# Patient Record
Sex: Male | Born: 1965 | Race: Black or African American | Hispanic: No | Marital: Married | Smoking: Never smoker
Health system: Southern US, Community
[De-identification: ages and names within clinical notes are randomized; demographics above are authoritative.]

## PROBLEM LIST (undated history)

## (undated) DIAGNOSIS — Z8619 Personal history of other infectious and parasitic diseases: Secondary | ICD-10-CM

## (undated) DIAGNOSIS — C6920 Malignant neoplasm of unspecified retina: Secondary | ICD-10-CM

## (undated) DIAGNOSIS — R319 Hematuria, unspecified: Secondary | ICD-10-CM

## (undated) DIAGNOSIS — C7800 Secondary malignant neoplasm of unspecified lung: Secondary | ICD-10-CM

## (undated) DIAGNOSIS — Z978 Presence of other specified devices: Secondary | ICD-10-CM

## (undated) DIAGNOSIS — R339 Retention of urine, unspecified: Secondary | ICD-10-CM

## (undated) DIAGNOSIS — R0600 Dyspnea, unspecified: Secondary | ICD-10-CM

## (undated) DIAGNOSIS — N182 Chronic kidney disease, stage 2 (mild): Secondary | ICD-10-CM

## (undated) DIAGNOSIS — I10 Essential (primary) hypertension: Secondary | ICD-10-CM

## (undated) DIAGNOSIS — Z8673 Personal history of transient ischemic attack (TIA), and cerebral infarction without residual deficits: Secondary | ICD-10-CM

## (undated) DIAGNOSIS — R600 Localized edema: Secondary | ICD-10-CM

## (undated) DIAGNOSIS — I5032 Chronic diastolic (congestive) heart failure: Secondary | ICD-10-CM

## (undated) DIAGNOSIS — Z86718 Personal history of other venous thrombosis and embolism: Secondary | ICD-10-CM

## (undated) DIAGNOSIS — Z95828 Presence of other vascular implants and grafts: Secondary | ICD-10-CM

## (undated) DIAGNOSIS — Z960 Presence of urogenital implants: Secondary | ICD-10-CM

## (undated) DIAGNOSIS — C61 Malignant neoplasm of prostate: Secondary | ICD-10-CM

## (undated) DIAGNOSIS — R0609 Other forms of dyspnea: Secondary | ICD-10-CM

## (undated) HISTORY — PX: LAPAROSCOPIC INGUINAL HERNIA REPAIR: SUR788

## (undated) HISTORY — PX: INTRAOCULAR PROSTHESES INSERTION: SHX360

---
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
## 2003-10-19 ENCOUNTER — Emergency Department (HOSPITAL_COMMUNITY): Admission: EM | Admit: 2003-10-19 | Discharge: 2003-10-19 | Payer: Self-pay

## 2004-01-17 ENCOUNTER — Emergency Department (HOSPITAL_COMMUNITY): Admission: EM | Admit: 2004-01-17 | Discharge: 2004-01-17 | Payer: Self-pay | Admitting: Emergency Medicine

## 2005-09-06 DIAGNOSIS — Z8673 Personal history of transient ischemic attack (TIA), and cerebral infarction without residual deficits: Secondary | ICD-10-CM

## 2005-09-06 HISTORY — DX: Personal history of transient ischemic attack (TIA), and cerebral infarction without residual deficits: Z86.73

## 2005-09-19 ENCOUNTER — Emergency Department (HOSPITAL_COMMUNITY): Admission: EM | Admit: 2005-09-19 | Discharge: 2005-09-19 | Payer: Self-pay | Admitting: Emergency Medicine

## 2005-11-29 ENCOUNTER — Emergency Department (HOSPITAL_COMMUNITY): Admission: EM | Admit: 2005-11-29 | Discharge: 2005-11-29 | Payer: Self-pay | Admitting: Emergency Medicine

## 2006-01-06 ENCOUNTER — Encounter: Payer: Self-pay | Admitting: Emergency Medicine

## 2006-01-06 ENCOUNTER — Inpatient Hospital Stay (HOSPITAL_COMMUNITY): Admission: EM | Admit: 2006-01-06 | Discharge: 2006-01-10 | Payer: Self-pay | Admitting: Internal Medicine

## 2006-01-06 ENCOUNTER — Encounter (INDEPENDENT_AMBULATORY_CARE_PROVIDER_SITE_OTHER): Payer: Self-pay | Admitting: Cardiology

## 2006-02-27 ENCOUNTER — Emergency Department (HOSPITAL_COMMUNITY): Admission: EM | Admit: 2006-02-27 | Discharge: 2006-02-27 | Payer: Self-pay | Admitting: Emergency Medicine

## 2006-03-08 ENCOUNTER — Emergency Department (HOSPITAL_COMMUNITY): Admission: EM | Admit: 2006-03-08 | Discharge: 2006-03-08 | Payer: Self-pay | Admitting: Emergency Medicine

## 2006-04-15 ENCOUNTER — Emergency Department (HOSPITAL_COMMUNITY): Admission: EM | Admit: 2006-04-15 | Discharge: 2006-04-15 | Payer: Self-pay | Admitting: Emergency Medicine

## 2006-09-07 ENCOUNTER — Emergency Department (HOSPITAL_COMMUNITY): Admission: EM | Admit: 2006-09-07 | Discharge: 2006-09-07 | Payer: Self-pay | Admitting: Emergency Medicine

## 2006-11-16 ENCOUNTER — Emergency Department (HOSPITAL_COMMUNITY): Admission: EM | Admit: 2006-11-16 | Discharge: 2006-11-16 | Payer: Self-pay | Admitting: Emergency Medicine

## 2007-02-21 ENCOUNTER — Emergency Department (HOSPITAL_COMMUNITY): Admission: EM | Admit: 2007-02-21 | Discharge: 2007-02-21 | Payer: Self-pay | Admitting: *Deleted

## 2007-03-10 ENCOUNTER — Emergency Department (HOSPITAL_COMMUNITY): Admission: EM | Admit: 2007-03-10 | Discharge: 2007-03-10 | Payer: Self-pay | Admitting: Emergency Medicine

## 2007-03-12 ENCOUNTER — Emergency Department (HOSPITAL_COMMUNITY): Admission: EM | Admit: 2007-03-12 | Discharge: 2007-03-12 | Payer: Self-pay | Admitting: Emergency Medicine

## 2007-03-14 ENCOUNTER — Encounter: Admission: RE | Admit: 2007-03-14 | Discharge: 2007-03-14 | Payer: Self-pay | Admitting: Orthopedic Surgery

## 2007-05-06 ENCOUNTER — Emergency Department (HOSPITAL_COMMUNITY): Admission: EM | Admit: 2007-05-06 | Discharge: 2007-05-06 | Payer: Self-pay | Admitting: Family Medicine

## 2007-05-25 ENCOUNTER — Emergency Department (HOSPITAL_COMMUNITY): Admission: EM | Admit: 2007-05-25 | Discharge: 2007-05-25 | Payer: Self-pay | Admitting: Emergency Medicine

## 2007-07-08 ENCOUNTER — Emergency Department (HOSPITAL_COMMUNITY): Admission: EM | Admit: 2007-07-08 | Discharge: 2007-07-08 | Payer: Self-pay | Admitting: Emergency Medicine

## 2007-08-14 ENCOUNTER — Emergency Department (HOSPITAL_COMMUNITY): Admission: EM | Admit: 2007-08-14 | Discharge: 2007-08-14 | Payer: Self-pay | Admitting: Family Medicine

## 2008-04-01 ENCOUNTER — Encounter: Payer: Self-pay | Admitting: Family Medicine

## 2008-04-01 ENCOUNTER — Ambulatory Visit: Payer: Self-pay

## 2008-06-15 ENCOUNTER — Emergency Department (HOSPITAL_COMMUNITY): Admission: EM | Admit: 2008-06-15 | Discharge: 2008-06-15 | Payer: Self-pay | Admitting: Emergency Medicine

## 2009-02-08 ENCOUNTER — Emergency Department (HOSPITAL_COMMUNITY): Admission: EM | Admit: 2009-02-08 | Discharge: 2009-02-08 | Payer: Self-pay | Admitting: Emergency Medicine

## 2009-04-11 ENCOUNTER — Emergency Department (HOSPITAL_COMMUNITY): Admission: EM | Admit: 2009-04-11 | Discharge: 2009-04-11 | Payer: Self-pay | Admitting: Emergency Medicine

## 2009-08-10 ENCOUNTER — Emergency Department (HOSPITAL_COMMUNITY): Admission: EM | Admit: 2009-08-10 | Discharge: 2009-08-10 | Payer: Self-pay | Admitting: Emergency Medicine

## 2010-03-19 ENCOUNTER — Encounter: Admission: RE | Admit: 2010-03-19 | Discharge: 2010-03-19 | Payer: Self-pay | Admitting: Internal Medicine

## 2010-04-02 ENCOUNTER — Emergency Department (HOSPITAL_COMMUNITY): Admission: EM | Admit: 2010-04-02 | Discharge: 2010-04-02 | Payer: Self-pay | Admitting: Emergency Medicine

## 2010-04-07 ENCOUNTER — Emergency Department (HOSPITAL_COMMUNITY): Admission: EM | Admit: 2010-04-07 | Discharge: 2010-04-08 | Payer: Self-pay | Admitting: Emergency Medicine

## 2010-06-07 ENCOUNTER — Emergency Department (HOSPITAL_COMMUNITY): Admission: EM | Admit: 2010-06-07 | Discharge: 2010-06-07 | Payer: Self-pay | Admitting: Emergency Medicine

## 2010-08-10 ENCOUNTER — Emergency Department (HOSPITAL_COMMUNITY)
Admission: EM | Admit: 2010-08-10 | Discharge: 2010-08-10 | Payer: Self-pay | Source: Home / Self Care | Admitting: Emergency Medicine

## 2010-11-02 ENCOUNTER — Encounter (HOSPITAL_COMMUNITY)
Admission: RE | Admit: 2010-11-02 | Discharge: 2010-11-02 | Disposition: A | Discharge status: Home / Self Care | Payer: BC Managed Care – PPO | Source: Ambulatory Visit | Attending: Surgery | Admitting: Surgery

## 2010-11-02 DIAGNOSIS — Z01810 Encounter for preprocedural cardiovascular examination: Secondary | ICD-10-CM | POA: Insufficient documentation

## 2010-11-02 DIAGNOSIS — Z01812 Encounter for preprocedural laboratory examination: Secondary | ICD-10-CM | POA: Insufficient documentation

## 2010-11-02 LAB — CBC
HCT: 41.9 % (ref 39.0–52.0)
Hemoglobin: 13.9 g/dL (ref 13.0–17.0)
MCH: 31.3 pg (ref 26.0–34.0)
MCHC: 33.2 g/dL (ref 30.0–36.0)
RDW: 12.3 % (ref 11.5–15.5)

## 2010-11-02 LAB — BASIC METABOLIC PANEL
CO2: 34 mEq/L — ABNORMAL HIGH (ref 19–32)
Calcium: 9.5 mg/dL (ref 8.4–10.5)
Creatinine, Ser: 1.13 mg/dL (ref 0.4–1.5)
Glucose, Bld: 120 mg/dL — ABNORMAL HIGH (ref 70–99)

## 2010-11-04 ENCOUNTER — Encounter (HOSPITAL_COMMUNITY)
Admission: RE | Admit: 2010-11-04 | Payer: BLUE CROSS/BLUE SHIELD | Source: Ambulatory Visit | Attending: Surgery | Admitting: Surgery

## 2010-11-19 ENCOUNTER — Ambulatory Visit (HOSPITAL_COMMUNITY)
Admission: RE | Admit: 2010-11-19 | Discharge: 2010-11-19 | Disposition: A | Discharge status: Home / Self Care | Payer: BC Managed Care – PPO | Source: Ambulatory Visit | Attending: Surgery | Admitting: Surgery

## 2010-11-19 DIAGNOSIS — K4090 Unilateral inguinal hernia, without obstruction or gangrene, not specified as recurrent: Secondary | ICD-10-CM | POA: Insufficient documentation

## 2010-11-19 DIAGNOSIS — K429 Umbilical hernia without obstruction or gangrene: Secondary | ICD-10-CM | POA: Insufficient documentation

## 2010-11-20 NOTE — Op Note (Signed)
Dylan Gonzalez, Dylan Gonzalez               ACCOUNT NO.:  [id number]  MEDICAL RECORD NO.:  [id number]           PATIENT TYPE:  O  LOCATION:  SDSC                         FACILITY:  [facility]  PHYSICIAN:  Abigail Miyamoto, M.D. DATE OF BIRTH:  September 01, 1966  DATE OF PROCEDURE:  11/19/2010 DATE OF DISCHARGE:  11/19/2010                              OPERATIVE REPORT   PREOPERATIVE DIAGNOSES:  Right inguinal hernia, umbilical hernia.  POSTOPERATIVE DIAGNOSES:  Right inguinal hernia, umbilical hernia.  PROCEDURE:  Laparoscopic right inguinal hernia repair with mesh, umbilical hernia repair.  SURGEON:  Abigail Miyamoto, MD  ANESTHESIA:  General and 0.5% Marcaine.  ESTIMATED BLOOD LOSS:  Minimal.  PROCEDURE IN DETAIL:  The patient was brought to the operating room and identified as Dylan Gonzalez.  He was placed supine on the operating table and general anesthesia was induced.  His abdomen was then prepped and draped in the usual sterile fashion.  Using a #15 blade, a small transverse incision was made in the lower edge of the umbilicus.  This was carried down to the fascia which was opened just right at the midline.  The rectus muscle was identified and elevated.  The dissecting balloon was then passed underneath the rectus muscle and manipulated towards the pubis.  The dissecting balloon was then insufflated dissecting out the preperitoneal space.  The dissecting balloon was then removed and insufflation was begun with carbon dioxide.  Two 5-mm ports were then placed in the patient's lower midline under direct vision. Dissection was then carried out in the right inguinal area.  The patient had a very large indirect hernia sac.  It was quite chronic in appearance and thick walled with moderate dissection.  I was able to finally reduce the sac from the testicular cord structures.  There was no evidence of direct hernia.  I then turned my attention toward the left groin and examined the testicular  cord structures and found no evidence of left inguinal hernia.  Next, a 6 inch x 6 inch piece of UltraPro Prolene mesh was brought into the field and fashioned it appropriately.  I placed it through the port down the umbilicus.  I then placed it only on the right inguinal floor.  I tacked it with the absorbable tacker to Cooper ligament up the abdominal wall medially and then out laterally.  Good coverage of the inguinal floor and cord structures appeared to be achieved.  I laid the old sac up over the top of the mesh so would not slide up under the mesh.  At this point, hemostasis appeared to be achieved.  The midline ports were removed under direct vision.  The preperitoneal spaces seemed to collapse appropriately.  At this point, all ports were then removed.  I then used a #1 Novafil suture to close the small fascial defect at the umbilicus. My fascial incision was then closed with figure-of-eight 0 Vicryl suture.  All incisions were anesthetized with Marcaine.  I performed an ilioinguinal nerve block with Marcaine as well.  I then closed all incisions with 4-0 Monocryl subcuticular sutures.  Steri-Strips and Band-Aids were then applied.  The  patient tolerated the procedure well.  All counts were correct at the end of the procedure.  The patient was then extubated in the operating room and taken in stable condition to the recovery room.     Abigail Miyamoto, M.D.     DB/MEDQ  D:  11/19/2010  T:  11/20/2010  Job:  [id number]  Electronically Signed by Abigail Miyamoto M.D. on 11/20/2010 08:45:58 AM

## 2011-01-22 NOTE — H&P (Signed)
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 Dylan Gonzalez says that he has been  aware of having hypertension for the last 3 years. He was first diagnosed  when he came to the emergency room on one occasion after cutting his finger.  He says that during that period of time, he has been treated with  hydrochlorothiazide by doctors at [facility] but he has taken it only  intermittently and none in the last 2 months. He says that about a month  ago, he had his blood pressure checked and was told that it was very high.  He also recalls that about a year and half ago he came to the emergency room  with an episode of chest pain and at that time he was also told that his  blood pressure was very high. No other workup was done except to advise him  to seek a primary doctor. Last night about 11:00 p.m., he states that he was  lying in bed when he suddenly experienced a sharp substernal chest pain  which was nonradiating and associated with a feeling of shortness of breath.  He also said he had a feeling of a fluttering in his chest.  He had a mild  throbbing headache.  He became very concerned when the chest pain persisted.  He is not exactly sure how long it lasted but he thinks about 5-10 minutes.  He came to the emergency room at about 1:00 a.m. At that time, his blood  pressure was 184/120.  Since that time, he has received labetalol 20 mg IV  x2 and his blood pressure is in the range of 170/105. Workup in the  emergency room so far has included a chest x-ray which appears to be  normal.  Initial point of care enzymes were negative.  His sodium was 142, potassium  3.3, BUN was 17, creatinine 1.2, hemoglobin was 13.4. PT and PTT were  normal.  A CT scan of the brain was obtained. This has not been read as yet.  Currently the patient feels well.  He denies headache, dizziness, chest  pain, shortness of breath or orthopnea.  He has had no ankle edema.  [name] is admitted at this time because of his chest pain and also to  regulate his blood pressure.   PAST MEDICAL HISTORY:  Dylan Gonzalez takes no medications.  He has no known drug  allergies. 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 Medical illnesses are as  above.   FAMILY HISTORY:  His father  and brother have a history of hypertension.   SOCIAL HISTORY:  As mentioned previously, he works in a [employer]. He is  married.  He does not smoke or drink.  He does not abuse drugs.   REVIEW OF SYSTEMS:  HEAD:  He denies headache or dizziness.  EYES:  He  denies visual blurring or diplopia.  Note that he has a prosthetic left eye.  ENT:  Denies earache, sinus pain or sore throat.  CHEST:  He currently  denies coughing, wheezing or chest congestion.  CARDIOVASCULAR:  He  currently denies orthopnea, PND or ankle edema.  GI: Denies nausea, vomiting  or abdominal pain.  GU: Denies dysuria or urinary frequency.  NEURO:  There  is no history of seizure or stroke. ENDO:  Denies excessive thirst, urinary  frequency or nocturia.   PHYSICAL EXAM:  GENERAL:  He is a pleasant cooperative man.  HEENT:  Exam is remarkable for the enucleated left eye.  There is no  meningismus.  CHEST:  Clear.  BACK:  Examination the back reveals no CVA or point tenderness.  CARDIOVASCULAR:  Shows normal S1 and S2 without rubs, murmurs or gallops.  ABDOMEN:  The abdomen is benign.  Normal bowel sounds without masses or  tenderness.  NEUROLOGIC:  On neurologic testing, cranial nerves,  motor, sensory and  cerebellar testing is normal.  EXTREMITIES:  Examination extremities shows no evidence of cyanosis or  edema.   IMPRESSION:  1.  Chest pain, rule out myocardial infarction.  2.  Uncontrolled hypertension.  This is apparently a chronic.  3.  History retinoblastoma.  4.  Mild hypokalemia.   PLAN:  Will obtain serial enzymes and place the patient on Lovenox  empirically. I am going to place him on a continuous labetalol infusion to  regulate his blood pressure. Will check a 24-hour urine for protein and a 2-  D echo. Eventually it would probably be a good idea to get a cardiology  consult on this gentleman. The most important thing is he needs to have  follow-up arranged with a primary doctor. Will also check a D-dimer and make  sure that he receives adequate potassium replacement.           ______________________________  Dylan Quarry, MD     SY/MEDQ  D:  01/06/2006  T:  01/06/2006  Job:  [id number]

## 2011-01-22 NOTE — Discharge Summary (Signed)
Dylan Gonzalez, Dylan Gonzalez               ACCOUNT NO.:  [id number]   MEDICAL RECORD NO.:  [id number]          PATIENT TYPE:  INP   LOCATION:  1418                         [facility]AC[facility]   PHYSICIAN:  Hollice Espy, M.D.DATE OF BIRTH:  [birth year]   DATE OF ADMISSION:  01/06/2006  DATE OF DISCHARGE:  01/10/2006                                 DISCHARGE SUMMARY   DISCHARGE DIAGNOSES:  1.  Uncontrolled hypertension.  2.  Episodes of chest pressure.  3.  Medical non-compliance.  4.  Renal insufficiency, likely chronic secondary to hypertension.  5.  History of retinal blastoma as a child, status post enucleation of the      left eye.   DISCHARGE MEDICATIONS:  The patient is being put on an anti-hypertensive  regimen of Benazepril 10 mg p.o. daily, Clonidine 0.2 mg 1/2 tab p.o.  b.i.d., Atenolol 50 mg 1/2 tab p.o. b.i.d., Imdur 30 p.o. daily,  hydrochlorothiazide 12.5 p.o. daily as well as an aspirin 81 p.o. daily.   HOSPITAL COURSE:  The patient is a 45 year old African-American male with  past medical history of retinal blastoma status post enucleation who was  diagnosed with hypertension approximately 2 years ago. He initially was  taking his medications but started feeling better and stopped taking them,  at least about a year ago. Since that time, he has been doing relatively  well with no other complaints. He started having problems with complaints of  weakness, headaches, and occasional episode of chest pressure. The patient  presented to the emergency room on Jan 06, 2006. There, he was found to have  an elevated blood pressure with a systolic greater than 200 and a diastolic  around 875. The patient was initially started on Labetalol drip and placed  in the step-down unit. EKG and enzymes were unremarkable for any ST or T  wave elevations or inversions. A 2-D echocardiogram was done, which showed  only signs of an ejection fraction normal at 50%. Over the next several  days,  the patient had some difficulty weaning off of Labetalol drip  completely. Oral medications were added and continued and finally, the  patient was able to be weaned off the drip on Jan 09, 2006. He was then  transferred to a telemetry floor where he has remained stable. Medications  were adjusted and by time of discharge, the patient's medication regimen was  good with a systolic blood pressure in the 130's, diastolic in the 70's to  80's with the medicine regimen as above. The patient tells me that given all  the events of the last few days, he will never again stop taking his  medications. He will also try to follow closely, a low-sodium diet as well  as weight loss plan. Given his stability, negative cardiac enzymes, and EKG,  the patient and I are in agreement that he needs outpatient followup with a  stress test as well as establishment with a PCP.   FOLLOW UP:  I have given him a referral number for PCP and have contacted  [facility] and patient will see Dr. Eldridge Dace on  Jan 26, 2006 at 2:30  p.m. for his treadmill stress test. T   DIET:  Low-sodium.   ACTIVITY:  As tolerated. He is advised to return back to work on Jan 12, 2006.   DISCHARGE MEDICATIONS:  All of his medications have been chosen as they are  followed under the [employer] plan at $4.00 a medication, to minimize cost for  the patient.   CONDITION ON DISCHARGE:  The patient's disposition is improved.   DISPOSITION:  He is being discharge to home.      Hollice Espy, M.D.  Electronically Signed     SKK/MEDQ  D:  01/10/2006  T:  01/11/2006  Job:  [id number]   cc:   Corky Crafts, MD  Fax: [phone]

## 2011-01-25 ENCOUNTER — Emergency Department (HOSPITAL_COMMUNITY)
Admission: EM | Admit: 2011-01-25 | Discharge: 2011-01-25 | Disposition: A | Discharge status: Home / Self Care | Payer: BC Managed Care – PPO | Attending: Emergency Medicine | Admitting: Emergency Medicine

## 2011-01-25 DIAGNOSIS — H5710 Ocular pain, unspecified eye: Secondary | ICD-10-CM | POA: Insufficient documentation

## 2011-01-25 DIAGNOSIS — H53149 Visual discomfort, unspecified: Secondary | ICD-10-CM | POA: Insufficient documentation

## 2011-01-25 DIAGNOSIS — K219 Gastro-esophageal reflux disease without esophagitis: Secondary | ICD-10-CM | POA: Insufficient documentation

## 2011-01-25 DIAGNOSIS — R51 Headache: Secondary | ICD-10-CM | POA: Insufficient documentation

## 2011-01-25 DIAGNOSIS — I10 Essential (primary) hypertension: Secondary | ICD-10-CM | POA: Insufficient documentation

## 2011-04-04 ENCOUNTER — Emergency Department (HOSPITAL_COMMUNITY)
Admission: EM | Admit: 2011-04-04 | Discharge: 2011-04-04 | Disposition: A | Discharge status: Home / Self Care | Payer: BC Managed Care – PPO | Attending: Emergency Medicine | Admitting: Emergency Medicine

## 2011-04-04 DIAGNOSIS — K219 Gastro-esophageal reflux disease without esophagitis: Secondary | ICD-10-CM | POA: Insufficient documentation

## 2011-04-04 DIAGNOSIS — I10 Essential (primary) hypertension: Secondary | ICD-10-CM | POA: Insufficient documentation

## 2011-04-04 DIAGNOSIS — R51 Headache: Secondary | ICD-10-CM | POA: Insufficient documentation

## 2011-06-15 LAB — BASIC METABOLIC PANEL
BUN: 11
CO2: 28
Chloride: 103
Creatinine, Ser: 1.12
Glucose, Bld: 107 — ABNORMAL HIGH

## 2011-06-17 LAB — CBC
Hemoglobin: 12.9 — ABNORMAL LOW
MCHC: 33.3
RDW: 13

## 2011-06-17 LAB — BASIC METABOLIC PANEL
BUN: 16
CO2: 33 — ABNORMAL HIGH
Calcium: 9.3
Creatinine, Ser: 1.24
Glucose, Bld: 95
Sodium: 146 — ABNORMAL HIGH

## 2011-06-17 LAB — POCT CARDIAC MARKERS: Myoglobin, poc: 88.9

## 2011-12-11 ENCOUNTER — Emergency Department (HOSPITAL_COMMUNITY): Payer: BC Managed Care – PPO

## 2011-12-11 ENCOUNTER — Other Ambulatory Visit: Payer: Self-pay

## 2011-12-11 ENCOUNTER — Emergency Department (HOSPITAL_COMMUNITY)
Admission: EM | Admit: 2011-12-11 | Discharge: 2011-12-11 | Disposition: A | Discharge status: Home / Self Care | Payer: BC Managed Care – PPO | Attending: Emergency Medicine | Admitting: Emergency Medicine

## 2011-12-11 ENCOUNTER — Encounter (HOSPITAL_COMMUNITY): Payer: Self-pay

## 2011-12-11 DIAGNOSIS — I10 Essential (primary) hypertension: Secondary | ICD-10-CM

## 2011-12-11 DIAGNOSIS — R0602 Shortness of breath: Secondary | ICD-10-CM | POA: Insufficient documentation

## 2011-12-11 DIAGNOSIS — R350 Frequency of micturition: Secondary | ICD-10-CM | POA: Insufficient documentation

## 2011-12-11 DIAGNOSIS — R51 Headache: Secondary | ICD-10-CM | POA: Insufficient documentation

## 2011-12-11 DIAGNOSIS — I517 Cardiomegaly: Secondary | ICD-10-CM | POA: Insufficient documentation

## 2011-12-11 HISTORY — DX: Essential (primary) hypertension: I10

## 2011-12-11 LAB — URINALYSIS, ROUTINE W REFLEX MICROSCOPIC
Bilirubin Urine: NEGATIVE
Hgb urine dipstick: NEGATIVE
Specific Gravity, Urine: 1.011 (ref 1.005–1.030)
Urobilinogen, UA: 0.2 mg/dL (ref 0.0–1.0)
pH: 7 (ref 5.0–8.0)

## 2011-12-11 LAB — POCT I-STAT, CHEM 8
BUN: 12 mg/dL (ref 6–23)
Calcium, Ion: 1.17 mmol/L (ref 1.12–1.32)
HCT: 42 % (ref 39.0–52.0)
Hemoglobin: 14.3 g/dL (ref 13.0–17.0)
Sodium: 145 mEq/L (ref 135–145)
TCO2: 30 mmol/L (ref 0–100)

## 2011-12-11 LAB — BASIC METABOLIC PANEL
BUN: 12 mg/dL (ref 6–23)
GFR calc non Af Amer: 72 mL/min — ABNORMAL LOW (ref >90)
Glucose, Bld: 93 mg/dL (ref 70–99)
Potassium: 3.1 mEq/L — ABNORMAL LOW (ref 3.5–5.1)

## 2011-12-11 LAB — POCT I-STAT TROPONIN I: Troponin i, poc: 0 ng/mL (ref 0.00–0.08)

## 2011-12-11 NOTE — ED Provider Notes (Signed)
History     CSN: [id number]  Arrival date & time 12/11/11  1026   First MD Initiated Contact with Patient 12/11/11 1034    11:20 AM HPI Patient reports for the last 3 day has noticed high blood pressure. States that usually he runs in the 150s/90s. States he has been taking his HTN meds as prescribed. Associated symptoms include mild intermittent headaches, increased urinary frequency and increasing SOB with exertion. Denies CP, dysuria, hematuria, cough, congestion, aphasia, ataxia or confusion. Reports he has also had increasing stressors in his life. 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 PCP is Dr. Irven Easterly, With [facility]. Takes Metoprolol 100mg  qd and Benazepril -HCTZ 20-25mg  qd. Denies smoking, ETOH , and drugs. Denies FHx of early CAD, DM, or hypercholesterolemia.   Patient is a 46 y.o. male presenting with hypertension. The history is provided by the patient.  Hypertension This is a new problem. The current episode started in the past 7 days. The problem has been unchanged. Associated symptoms include headaches and urinary symptoms. Pertinent negatives include no abdominal pain, chest pain, chills, congestion, coughing, diaphoresis, fatigue, fever, nausea, numbness, sore throat, vertigo, visual change, vomiting or weakness. He has tried nothing for the symptoms.    Past Medical History  Diagnosis Date  . Hypertension     History reviewed. No pertinent past surgical history.  History reviewed. No pertinent family history.  History  Substance Use Topics  . Smoking status: Never Smoker   . Smokeless tobacco: Not on file  . Alcohol Use: No      Review of Systems  Constitutional: Negative for fever, chills, diaphoresis and fatigue.  HENT: Negative for congestion and sore throat.   Respiratory: Positive for shortness of breath. Negative for cough.     Cardiovascular: Negative for chest pain.  Gastrointestinal: Negative for nausea, vomiting and abdominal pain.  Genitourinary: Positive for frequency. Negative for dysuria and hematuria.  Musculoskeletal: Negative for back pain.  Neurological: Positive for headaches. Negative for dizziness, vertigo, weakness and numbness.  All other systems reviewed and are negative.    Allergies  Review of patient's allergies indicates no known allergies.  Home Medications  No current outpatient prescriptions on file.  BP 170/113  Pulse 61  Temp(Src) 97.7 F (36.5 C) (Oral)  Resp 18  SpO2 100%  Physical Exam  Constitutional: He is oriented to person, place, and time. He appears well-developed and well-nourished.  HENT:  Head: Normocephalic and atraumatic.  Mouth/Throat: Oropharynx is clear and moist. No oropharyngeal exudate.  Eyes: Conjunctivae, EOM and lids are normal. Pupils are equal, round, and reactive to light.       Left eye is a prosthetic  Neck: Normal range of motion. Neck supple.  Cardiovascular: Normal rate, regular rhythm and normal heart sounds.  Exam reveals no gallop and no friction rub.   No murmur heard. Pulmonary/Chest: Effort normal and breath sounds normal. He has no wheezes. He has no rales. He exhibits no tenderness.  Abdominal: Soft. Bowel sounds are normal. He exhibits no distension and no mass. There is no tenderness. There is no rebound and no guarding.  Neurological: He is alert and oriented to person, place, and time. No cranial nerve deficit. Coordination normal.  Skin: Skin is warm and dry. No rash noted. No erythema. No pallor.  Psychiatric: He has a normal mood and affect. His behavior is normal.  ED Course  Procedures  Results for orders placed during the hospital encounter of 12/11/11  BASIC METABOLIC PANEL      Component Value Range   Sodium 141  135 - 145 (mEq/L)   Potassium 3.1 (*) 3.5 - 5.1 (mEq/L)   Chloride 102  96 - 112 (mEq/L)   CO2 32  19  - 32 (mEq/L)   Glucose, Bld 93  70 - 99 (mg/dL)   BUN 12  6 - 23 (mg/dL)   Creatinine, Ser 8.65  0.50 - 1.35 (mg/dL)   Calcium 8.8  8.4 - 78.4 (mg/dL)   GFR calc non Af Amer 72 (*) >90 (mL/min)   GFR calc Af Amer 83 (*) >90 (mL/min)  URINALYSIS, ROUTINE W REFLEX MICROSCOPIC      Component Value Range   Color, Urine YELLOW  YELLOW    APPearance CLEAR  CLEAR    Specific Gravity, Urine 1.011  1.005 - 1.030    pH 7.0  5.0 - 8.0    Glucose, UA NEGATIVE  NEGATIVE (mg/dL)   Hgb urine dipstick NEGATIVE  NEGATIVE    Bilirubin Urine NEGATIVE  NEGATIVE    Ketones, ur NEGATIVE  NEGATIVE (mg/dL)   Protein, ur NEGATIVE  NEGATIVE (mg/dL)   Urobilinogen, UA 0.2  0.0 - 1.0 (mg/dL)   Nitrite NEGATIVE  NEGATIVE    Leukocytes, UA NEGATIVE  NEGATIVE   POCT I-STAT, CHEM 8      Component Value Range   Sodium 145  135 - 145 (mEq/L)   Potassium 3.2 (*) 3.5 - 5.1 (mEq/L)   Chloride 103  96 - 112 (mEq/L)   BUN 12  6 - 23 (mg/dL)   Creatinine, Ser 6.96 (*) 0.50 - 1.35 (mg/dL)   Glucose, Bld 98  70 - 99 (mg/dL)   Calcium, Ion 2.95  2.84 - 1.32 (mmol/L)   TCO2 30  0 - 100 (mmol/L)   Hemoglobin 14.3  13.0 - 17.0 (g/dL)   HCT 13.2  44.0 - 10.2 (%)  POCT I-STAT TROPONIN I      Component Value Range   Troponin i, poc 0.00  0.00 - 0.08 (ng/mL)   Comment 3            Dg Chest 2 View  12/11/2011  *RADIOLOGY REPORT*  Clinical Data: 46 year old male with shortness of breath, chest pain.  CHEST - 2 VIEW  Comparison: 04/07/2010 and earlier.  Findings: Stable cardiomegaly and mediastinal contours.  No pneumothorax, pulmonary edema, pleural effusion or confluent pulmonary opacity. Visualized tracheal air column is within normal limits.  No acute osseous abnormality identified.  IMPRESSION: No acute cardiopulmonary abnormality.  Original Report Authenticated By: Harley Hallmark, M.D.    ED ECG REPORT   Date: 12/11/2011  EKG Time: 1:48 PM  Rate: 29  Rhythm: normal sinus rhythm,  normal EKG, normal sinus rhythm,  unchanged from previous tracings  Axis: Normal   Intervals:none  ST&T Change: none    Narrative Interpretation: LVH    MDM   1:49 PM I-stat indicated elevated Cr but Basic metabolic was normal. CXR was normal. Advised f/u today or Monday with PCP for placement on another HTN med. Advised return to the ED for worsening symptoms such as chest pain, worsening SOB or HAs. Patient currently is asymptomatic. Pt agrees with plan and is ready for d/c. Discussed plan with Dr. Patria Mane.        Thomasene Lot, PA-C 12/11/11 1352

## 2011-12-11 NOTE — Discharge Instructions (Signed)
Please follow-up with your doctor today or Monday for management of blood pressure. Labs, EKG and CXR were normal today but it is important to control your blood pressure.    Hypertension Information As your heart beats, it forces blood through your arteries. This force is your blood pressure. If the pressure is too high, it is called hypertension (HTN) or high blood pressure. HTN is dangerous because you may have it and not know it. High blood pressure may mean that your heart has to work harder to pump blood. Your arteries may be narrow or stiff. The extra work puts you at risk for heart disease, stroke, and other problems.  Blood pressure consists of two numbers, a higher number over a lower, 110/72, for example. It is stated as "110 over 72." The ideal is below 120 for the top number (systolic) and under 80 for the bottom (diastolic).  You should pay close attention to your blood pressure if you have certain conditions such as:  Heart failure.   Prior heart attack.   Diabetes   Chronic kidney disease.   Prior stroke.   Multiple risk factors for heart disease.  To see if you have HTN, your blood pressure should be measured while you are seated with your arm held at the level of the heart. It should be measured at least twice. A one-time elevated blood pressure reading (especially in the Emergency Department) does not mean that you need treatment. There may be conditions in which the blood pressure is different between your right and left arms. It is important to see your caregiver soon for a recheck. Most people have essential hypertension which means that there is not a specific cause. This type of high blood pressure may be lowered by changing lifestyle factors such as:  Stress.   Smoking.   Lack of exercise.   Excessive weight.   Drug/tobacco/alcohol use.   Eating less salt.  Most people do not have symptoms from high blood pressure until it has caused damage to the body.  Effective treatment can often prevent, delay or reduce that damage. TREATMENT  Treatment for high blood pressure, when a cause has been identified, is directed at the cause. There are a large number of medications to treat HTN. These fall into several categories, and your caregiver will help you select the medicines that are best for you. Medications may have side effects. You should review side effects with your caregiver. If your blood pressure stays high after you have made lifestyle changes or started on medicines,   Your medication(s) may need to be changed.   Other problems may need to be addressed.   Be certain you understand your prescriptions, and know how and when to take your medicine.   Be sure to follow up with your caregiver within the time frame advised (usually within two weeks) to have your blood pressure rechecked and to review your medications.   If you are taking more than one medicine to lower your blood pressure, make sure you know how and at what times they should be taken. Taking two medicines at the same time can result in blood pressure that is too low.  Document Released: 10/26/2005 Document Revised: 05/05/2011 Document Reviewed: 11/02/2007 [facility] Patient Information 2012 [place], [place].

## 2011-12-11 NOTE — ED Provider Notes (Signed)
Medical screening examination/treatment/procedure(s) were performed by non-physician practitioner and as supervising physician I was immediately available for consultation/collaboration.   Lyanne Co, MD 12/11/11 [id number]

## 2011-12-11 NOTE — ED Notes (Signed)
Pt states hx of htn and pressure has been elevated for the past 3 days pt c/o headaches x2 days states been medications as prescribed

## 2011-12-16 ENCOUNTER — Ambulatory Visit: Payer: Self-pay | Admitting: Family Medicine

## 2011-12-26 ENCOUNTER — Encounter: Payer: Self-pay | Admitting: Family Medicine

## 2011-12-26 ENCOUNTER — Ambulatory Visit (INDEPENDENT_AMBULATORY_CARE_PROVIDER_SITE_OTHER): Payer: BC Managed Care – PPO | Admitting: Family Medicine

## 2011-12-26 VITALS — BP 159/103 | HR 64 | Temp 97.9°F | Resp 16 | Ht 70.5 in | Wt 198.4 lb

## 2011-12-26 DIAGNOSIS — I10 Essential (primary) hypertension: Secondary | ICD-10-CM

## 2011-12-26 DIAGNOSIS — C6920 Malignant neoplasm of unspecified retina: Secondary | ICD-10-CM | POA: Insufficient documentation

## 2011-12-26 HISTORY — DX: Malignant neoplasm of unspecified retina: C69.20

## 2011-12-26 LAB — POCT UA - MICROSCOPIC ONLY
Casts, Ur, LPF, POC: NEGATIVE
Crystals, Ur, HPF, POC: NEGATIVE
Mucus, UA: POSITIVE
Yeast, UA: NEGATIVE

## 2011-12-26 LAB — POCT CBC
Granulocyte percent: 51.4 %G (ref 37–80)
HCT, POC: 42.6 % — AB (ref 43.5–53.7)
Hemoglobin: 13.5 g/dL — AB (ref 14.1–18.1)
Lymph, poc: 2.5 (ref 0.6–3.4)
MCH, POC: 29.9 pg (ref 27–31.2)
MCHC: 31.7 g/dL — AB (ref 31.8–35.4)
MCV: 94.5 fL (ref 80–97)
MID (cbc): 0.6 (ref 0–0.9)
MPV: 9.7 fL (ref 0–99.8)
POC Granulocyte: 3.3 (ref 2–6.9)
POC LYMPH PERCENT: 39.4 %L (ref 10–50)
POC MID %: 9.2 %M (ref 0–12)
Platelet Count, POC: 209 10*3/uL (ref 142–424)
RBC: 4.51 M/uL — AB (ref 4.69–6.13)
RDW, POC: 13 %
WBC: 6.4 10*3/uL (ref 4.6–10.2)

## 2011-12-26 LAB — POCT URINALYSIS DIPSTICK
Bilirubin, UA: NEGATIVE
Blood, UA: NEGATIVE
Glucose, UA: NEGATIVE
Ketones, UA: NEGATIVE
Leukocytes, UA: NEGATIVE
Nitrite, UA: NEGATIVE
Protein, UA: NEGATIVE
Spec Grav, UA: 1.02
Urobilinogen, UA: 0.2
pH, UA: 6.5

## 2011-12-26 LAB — POCT SEDIMENTATION RATE: POCT SED RATE: 4 mm/hr (ref 0–22)

## 2011-12-26 MED ORDER — BENAZEPRIL-HYDROCHLOROTHIAZIDE 20-25 MG PO TABS: 1.0000 | ORAL_TABLET | Freq: Every day | ORAL | Status: DC

## 2011-12-26 MED ORDER — METOPROLOL SUCCINATE ER 200 MG PO TB24: 200.0000 mg | ORAL_TABLET | Freq: Every day | ORAL | Status: DC

## 2011-12-26 NOTE — Patient Instructions (Addendum)
RETURN IN ONE MONTH FOR BLOOD PRESSURE CHECK   Hypertension As your heart beats, it forces blood through your arteries. This force is your blood pressure. If the pressure is too high, it is called hypertension (HTN) or high blood pressure. HTN is dangerous because you may have it and not know it. High blood pressure may mean that your heart has to work harder to pump blood. Your arteries may be narrow or stiff. The extra work puts you at risk for heart disease, stroke, and other problems.  Blood pressure consists of two numbers, a higher number over a lower, 110/72, for example. It is stated as "110 over 72." The ideal is below 120 for the top number (systolic) and under 80 for the bottom (diastolic). Write down your blood pressure today. You should pay close attention to your blood pressure if you have certain conditions such as:  Heart failure.   Prior heart attack.   Diabetes   Chronic kidney disease.   Prior stroke.   Multiple risk factors for heart disease.  To see if you have HTN, your blood pressure should be measured while you are seated with your arm held at the level of the heart. It should be measured at least twice. A one-time elevated blood pressure reading (especially in the Emergency Department) does not mean that you need treatment. There may be conditions in which the blood pressure is different between your right and left arms. It is important to see your caregiver soon for a recheck. Most people have essential hypertension which means that there is not a specific cause. This type of high blood pressure may be lowered by changing lifestyle factors such as:  Stress.   Smoking.   Lack of exercise.   Excessive weight.   Drug/tobacco/alcohol use.   Eating less salt.  Most people do not have symptoms from high blood pressure until it has caused damage to the body. Effective treatment can often prevent, delay or reduce that damage. TREATMENT  When a cause has been  identified, treatment for high blood pressure is directed at the cause. There are a large number of medications to treat HTN. These fall into several categories, and your caregiver will help you select the medicines that are best for you. Medications may have side effects. You should review side effects with your caregiver. If your blood pressure stays high after you have made lifestyle changes or started on medicines,   Your medication(s) may need to be changed.   Other problems may need to be addressed.   Be certain you understand your prescriptions, and know how and when to take your medicine.   Be sure to follow up with your caregiver within the time frame advised (usually within two weeks) to have your blood pressure rechecked and to review your medications.   If you are taking more than one medicine to lower your blood pressure, make sure you know how and at what times they should be taken. Taking two medicines at the same time can result in blood pressure that is too low.  SEEK IMMEDIATE MEDICAL CARE IF:  You develop a severe headache, blurred or changing vision, or confusion.   You have unusual weakness or numbness, or a faint feeling.   You have severe chest or abdominal pain, vomiting, or breathing problems.  MAKE SURE YOU:   Understand these instructions.   Will watch your condition.   Will get help right away if you are not doing well or get  worse.  Document Released: 08/23/2005 Document Revised: 08/12/2011 Document Reviewed: 04/12/2008 [facility] Patient Information 2012 [place], [place].

## 2011-12-26 NOTE — Progress Notes (Signed)
45 YO WORKER AT [employer], married.  Patient has hypertension x 8 years with recent increasing blood pressures.  No chest pain or shortness of breath.  O:  NAD Blind left eye Neck supple without adenopathy, thyromegaly Chest:  Clear Heart: I/VI systolic murmur Ext: no edema  A:  Uncontrolled hypertension  P:  Increase the toprol XL to 200, continue the benazepril,  and recheck in 1 month

## 2011-12-27 LAB — COMPREHENSIVE METABOLIC PANEL
ALT: 14 U/L (ref 0–53)
AST: 20 U/L (ref 0–37)
Albumin: 4.3 g/dL (ref 3.5–5.2)
Alkaline Phosphatase: 48 U/L (ref 39–117)
BUN: 12 mg/dL (ref 6–23)
CO2: 34 mEq/L — ABNORMAL HIGH (ref 19–32)
Calcium: 9.6 mg/dL (ref 8.4–10.5)
Chloride: 104 mEq/L (ref 96–112)
Creat: 1.19 mg/dL (ref 0.50–1.35)
Glucose, Bld: 92 mg/dL (ref 70–99)
Potassium: 3.6 mEq/L (ref 3.5–5.3)
Sodium: 144 mEq/L (ref 135–145)
Total Bilirubin: 0.5 mg/dL (ref 0.3–1.2)
Total Protein: 6.7 g/dL (ref 6.0–8.3)

## 2011-12-27 LAB — LIPID PANEL
Cholesterol: 138 mg/dL (ref 0–200)
HDL: 39 mg/dL — ABNORMAL LOW (ref >39)
LDL Cholesterol: 64 mg/dL (ref 0–99)
Total CHOL/HDL Ratio: 3.5 Ratio
Triglycerides: 175 mg/dL — ABNORMAL HIGH (ref <150)
VLDL: 35 mg/dL (ref 0–40)

## 2012-02-24 ENCOUNTER — Other Ambulatory Visit: Payer: Self-pay | Admitting: Family Medicine

## 2012-03-14 ENCOUNTER — Other Ambulatory Visit: Payer: Self-pay | Admitting: Family Medicine

## 2012-03-20 ENCOUNTER — Other Ambulatory Visit: Payer: Self-pay | Admitting: Family Medicine

## 2012-04-02 ENCOUNTER — Emergency Department (HOSPITAL_COMMUNITY)
Admission: EM | Admit: 2012-04-02 | Discharge: 2012-04-02 | Disposition: A | Discharge status: Home / Self Care | Payer: BC Managed Care – PPO | Attending: Emergency Medicine | Admitting: Emergency Medicine

## 2012-04-02 ENCOUNTER — Encounter (HOSPITAL_COMMUNITY): Payer: Self-pay | Admitting: Emergency Medicine

## 2012-04-02 DIAGNOSIS — R51 Headache: Secondary | ICD-10-CM | POA: Insufficient documentation

## 2012-04-02 DIAGNOSIS — I10 Essential (primary) hypertension: Secondary | ICD-10-CM | POA: Insufficient documentation

## 2012-04-02 MED ORDER — AMLODIPINE BESYLATE 5 MG PO TABS: 5.0000 mg | ORAL_TABLET | Freq: Once | ORAL | Status: DC

## 2012-04-02 MED ORDER — AMLODIPINE BESYLATE 5 MG PO TABS
5.0000 mg | ORAL_TABLET | Freq: Once | ORAL | Status: AC
  Administered 2012-04-02: 5 mg via ORAL
  Filled 2012-04-02: qty 1

## 2012-04-02 MED ORDER — ACETAMINOPHEN 325 MG PO TABS
650.0000 mg | ORAL_TABLET | Freq: Once | ORAL | Status: AC
  Administered 2012-04-02: 650 mg via ORAL
  Filled 2012-04-02: qty 2

## 2012-04-02 NOTE — ED Provider Notes (Signed)
History     CSN: [id number]  Arrival date & time 04/02/12  0029   First MD Initiated Contact with Patient 04/02/12 0123      Chief Complaint  Patient presents with  . Hypertension  . Headache    HPI Pt checked his BP tonight and noticed it was pretty high, 171/105.  Pt checked it again later this evening and it was still high.  Pt has had a mild headache but nothing severe.  He has not had chest pain, shortness of breath, numbness or weakness.  Pt has been taking his blood pressure medications.  He actually has an appointment with his doctor this Monday because his BP has been running high. Past Medical History  Diagnosis Date  . Hypertension     History reviewed. No pertinent past surgical history.  Family History  Problem Relation Age of Onset  . Hypertension Father   . Depression Brother     History  Substance Use Topics  . Smoking status: Never Smoker   . Smokeless tobacco: Not on file  . Alcohol Use: No      Review of Systems  All other systems reviewed and are negative.    Allergies  Review of patient's allergies indicates no known allergies.  Home Medications   Current Outpatient Rx  Name Route Sig Dispense Refill  . BENAZEPRIL-HYDROCHLOROTHIAZIDE 20-25 MG PO TABS Oral Take 1 tablet by mouth daily. 90 tablet 3  . DIPHENHYDRAMINE HCL 25 MG PO CAPS Oral Take 25 mg by mouth every 6 (six) hours as needed. For allergies    . METOPROLOL SUCCINATE ER 200 MG PO TB24 Oral Take 1 tablet (200 mg total) by mouth daily. 90 tablet 3    BP 161/106  Pulse 57  Temp 98 F (36.7 C) (Oral)  Resp 15  SpO2 98%  Physical Exam  Nursing note and vitals reviewed. Constitutional: He appears well-developed and well-nourished. No distress.  HENT:  Head: Normocephalic and atraumatic.  Right Ear: External ear normal.  Left Ear: External ear normal.  Eyes: Conjunctivae are normal. Right eye exhibits no discharge. Left eye exhibits no discharge. No scleral icterus.  Neck:  Neck supple. No tracheal deviation present.  Cardiovascular: Normal rate, regular rhythm and intact distal pulses.   Pulmonary/Chest: Effort normal and breath sounds normal. No stridor. No respiratory distress. He has no wheezes. He has no rales.  Abdominal: Soft. Bowel sounds are normal. He exhibits no distension. There is no tenderness. There is no rebound and no guarding.  Musculoskeletal: He exhibits no edema and no tenderness.  Neurological: He is alert. He has normal strength. No sensory deficit. Cranial nerve deficit:  no gross defecits noted. He exhibits normal muscle tone. He displays no seizure activity. Coordination normal.  Skin: Skin is warm and dry. No rash noted.  Psychiatric: He has a normal mood and affect.    ED Course  Procedures (including critical care time)  Labs Reviewed - No data to display No results found.   No diagnosis found.    MDM  Blood pressure has decreased to  147/97 without any medications or intervention here in the emergency department. He is asymptomatic at this time other than a very mild headache. He does not appear to have evidence of endorgan ischemia. I reviewed his blood pressure medications and is already on maximum dose of his metoprolol and his Lotensin HCT. I will add on a dose of Norvasc for him and will give him a prescription to take in  the meantime.  he does have plans to followup with his doctor on Monday.          Celene Kras, MD 04/02/12 [phone]

## 2012-04-02 NOTE — ED Notes (Signed)
Per EMS, pt. Is from home who complaint of hypertension with headache, BP of 210/138 mmhg, denies of chest pain nor SOB, no N/V.

## 2012-04-02 NOTE — ED Notes (Signed)
RUE:[id number]<JW> Expected date:<BR> Expected time:<BR> Means of arrival:<BR> Comments:<BR> Medic 241, HTN, H/A, Rm 20

## 2012-05-01 ENCOUNTER — Ambulatory Visit: Payer: BC Managed Care – PPO | Admitting: Family Medicine

## 2012-05-01 VITALS — BP 174/108 | HR 80 | Temp 97.5°F | Resp 18 | Ht 70.5 in | Wt 194.0 lb

## 2012-05-01 DIAGNOSIS — I10 Essential (primary) hypertension: Secondary | ICD-10-CM

## 2012-05-01 MED ORDER — AMLODIPINE BESYLATE 5 MG PO TABS: 5.0000 mg | ORAL_TABLET | Freq: Every day | ORAL | Status: DC

## 2012-05-01 NOTE — Progress Notes (Signed)
  Subjective:    Patient ID: Dylan Gonzalez, male    DOB: [birth year], 46 y.o.   MRN: [id number]  HPI Dylan Gonzalez is a 46 y.o. male Hx of HTN - last ov here 12/26/11.  BP 159/103 at that time.  Increased toprol to 200mg  each day, and planned on follow up in 1 month. Creatinine 1.19, no protein on U/a at that OV.   Seen in ER 04/02/12 for headache and elevated BP - BP 171/105 at home, 161/106 in ER, then down to  147/97 in ER.  Added Norvasc and planned on quick follow up (per notes in ER- pt had appt scheduled for follow up in a few days).   Prior to seeing patient - he stated he was here to see Dr. Milus Glazier. BP rechecked, denies any chest pain, difficulty breathing, weakness, slurred speech or headache. He does have some frequency.  Review of Systems   Never took the Norvasc. Objective:   Physical Exam:  160/104  Blind left eye Chest:  Clear Heart:  Reg, no murmur Ext: no edema     Assessment & Plan:  Uncontrolled BP

## 2013-01-20 ENCOUNTER — Other Ambulatory Visit: Payer: Self-pay | Admitting: Family Medicine

## 2013-01-20 ENCOUNTER — Encounter (HOSPITAL_COMMUNITY): Payer: Self-pay | Admitting: Family Medicine

## 2013-01-20 ENCOUNTER — Emergency Department (HOSPITAL_COMMUNITY)
Admission: EM | Admit: 2013-01-20 | Discharge: 2013-01-20 | Disposition: A | Discharge status: Home / Self Care | Payer: BC Managed Care – PPO | Attending: Emergency Medicine | Admitting: Emergency Medicine

## 2013-01-20 DIAGNOSIS — R6884 Jaw pain: Secondary | ICD-10-CM | POA: Insufficient documentation

## 2013-01-20 DIAGNOSIS — K089 Disorder of teeth and supporting structures, unspecified: Secondary | ICD-10-CM | POA: Insufficient documentation

## 2013-01-20 DIAGNOSIS — K0889 Other specified disorders of teeth and supporting structures: Secondary | ICD-10-CM

## 2013-01-20 DIAGNOSIS — Z79899 Other long term (current) drug therapy: Secondary | ICD-10-CM | POA: Insufficient documentation

## 2013-01-20 DIAGNOSIS — Z9889 Other specified postprocedural states: Secondary | ICD-10-CM | POA: Insufficient documentation

## 2013-01-20 DIAGNOSIS — I10 Essential (primary) hypertension: Secondary | ICD-10-CM | POA: Insufficient documentation

## 2013-01-20 MED ORDER — AMOXICILLIN 500 MG PO CAPS: 500.0000 mg | ORAL_CAPSULE | Freq: Three times a day (TID) | ORAL | Status: DC

## 2013-01-20 MED ORDER — IBUPROFEN 800 MG PO TABS: 800.0000 mg | ORAL_TABLET | Freq: Three times a day (TID) | ORAL | Status: DC

## 2013-01-20 MED ORDER — OXYCODONE-ACETAMINOPHEN 5-325 MG PO TABS
1.0000 | ORAL_TABLET | Freq: Once | ORAL | Status: AC
  Administered 2013-01-20: 1 via ORAL
  Filled 2013-01-20: qty 1

## 2013-01-20 NOTE — ED Provider Notes (Signed)
History    This chart was scribed for Dylan Gonzalez (PA) non-physician practitioner working with Dylan Quarry, MD by [name], ED Scribe. This patient was seen in room WTR6/WTR6 and the patient's care was started at 10:13PM.   CSN: [id number]  Arrival date & time 01/20/13  2209   First MD Initiated Contact with Patient 01/20/13 2213      Chief Complaint  Patient presents with  . Dental Pain    (Consider location/radiation/quality/duration/timing/severity/associated sxs/prior treatment) The history is provided by the patient. No language interpreter was used.    Dylan Gonzalez is a 47 y.o. male , with a hx of hypertension and hernia repair, who presents to the Emergency Department complaining of sudden, progressively worsening, non radiating, dental pain, located at the right upper and lower jaw, onset today (01/20/13). The pt reports he has been experiencing a sharp, throbbing, dental pain within the right upper and lower jaw at 5:00PM today (01/20/13). The pt has not taken any medications PTA to relieve his dental pain. States chewing makes it worse. No fever, chills, facial swelling, malaise.   The pt denies experiencing any injury which may have prompted his toothache at the present point and time.   The pt does not smoke or drink alcohol.   PCP is Dr. Milus Glazier.    Past Medical History  Diagnosis Date  . Hypertension     Past Surgical History  Procedure Laterality Date  . Hernia repair      Family History  Problem Relation Age of Onset  . Hypertension Father   . Depression Brother     History  Substance Use Topics  . Smoking status: Never Smoker   . Smokeless tobacco: Not on file  . Alcohol Use: No      Review of Systems  HENT: Positive for dental problem.   All other systems reviewed and are negative.    Allergies  Review of patient's allergies indicates no known allergies.  Home Medications   Current Outpatient Rx  Name  Route  Sig   Dispense  Refill  . amLODipine (NORVASC) 5 MG tablet   Oral   Take 5 mg by mouth every morning.         . benazepril-hydrochlorthiazide (LOTENSIN HCT) 20-25 MG per tablet   Oral   Take 1 tablet by mouth every morning.         . diphenhydrAMINE (BENADRYL) 25 mg capsule   Oral   Take 25 mg by mouth every 6 (six) hours as needed for allergies. For allergies         . metoprolol (TOPROL-XL) 200 MG 24 hr tablet   Oral   Take 200 mg by mouth every morning. Needs office visit for further refills           BP 144/97  Pulse 76  Temp(Src) 97.9 F (36.6 C) (Oral)  Resp 20  Ht 5\' 11"  (1.803 m)  Wt 185 lb (83.915 kg)  BMI 25.81 kg/m2  SpO2 99%  Physical Exam  Nursing note and vitals reviewed. Constitutional: He is oriented to person, place, and time. He appears well-developed and well-nourished. No distress.  HENT:  Head: Normocephalic and atraumatic.  Poor dental hygiene. Pt with pain to the right upper and lower lateral incisors. No obvious cavities or surrounding gum swelling nor redness noted. Cystic structure to the gum over the left lateral incisor. Nontender. No drainage.   Eyes: EOM are normal.  Neck: Neck supple. No tracheal deviation present.  Cardiovascular: Normal rate.   Pulmonary/Chest: Effort normal. No respiratory distress.  Musculoskeletal: Normal range of motion.  Neurological: He is alert and oriented to person, place, and time.  Skin: Skin is warm and dry.  Psychiatric: He has a normal mood and affect. His behavior is normal.    ED Course  Procedures (including critical care time)  DIAGNOSTIC STUDIES: Oxygen Saturation is 99% on room air, normal by my interpretation.    COORDINATION OF CARE:  10:41 PM- Treatment plan discussed with patient. Pt agrees with treatment.      Labs Reviewed - No data to display No results found.   1. Pain, dental       MDM  Pt with dental pain. No exam findings. Given percocet in ED. Home with ibuprofen,  amoxil. Follow up with a dentist.   Filed Vitals:   01/20/13 2215  BP: 144/97  Pulse: 76  Temp: 97.9 F (36.6 C)  TempSrc: Oral  Resp: 20  Height: 5\' 11"  (1.803 m)  Weight: 185 lb (83.915 kg)  SpO2: 99%     I personally performed the services described in this documentation, which was scribed in my presence. The recorded information has been reviewed and is accurate.    Lottie Mussel, PA-C 01/21/13 0017

## 2013-01-20 NOTE — ED Notes (Signed)
Patient states that he has had a toothache for the past couple of hours. Indicates left upper and lower molars. Has not taken any medication for the pain.

## 2013-01-21 ENCOUNTER — Other Ambulatory Visit: Payer: Self-pay | Admitting: Family Medicine

## 2013-01-22 ENCOUNTER — Other Ambulatory Visit: Payer: Self-pay | Admitting: Family Medicine

## 2013-01-22 NOTE — ED Provider Notes (Signed)
History/physical exam/procedure(s) were performed by non-physician practitioner and as supervising physician I was immediately available for consultation/collaboration. I have reviewed all notes and am in agreement with care and plan.   Kirti Carl S Aleksi Brummet, MD 01/22/13 0112 

## 2013-01-23 ENCOUNTER — Other Ambulatory Visit: Payer: Self-pay | Admitting: Family Medicine

## 2013-02-21 ENCOUNTER — Other Ambulatory Visit: Payer: Self-pay | Admitting: Physician Assistant

## 2013-02-22 ENCOUNTER — Other Ambulatory Visit: Payer: Self-pay | Admitting: Physician Assistant

## 2013-03-15 ENCOUNTER — Encounter (HOSPITAL_COMMUNITY): Payer: Self-pay | Admitting: Cardiology

## 2013-03-15 ENCOUNTER — Emergency Department (HOSPITAL_COMMUNITY)
Admission: EM | Admit: 2013-03-15 | Discharge: 2013-03-16 | Disposition: A | Discharge status: Home / Self Care | Payer: BC Managed Care – PPO | Attending: Emergency Medicine | Admitting: Emergency Medicine

## 2013-03-15 ENCOUNTER — Emergency Department (HOSPITAL_COMMUNITY): Payer: BC Managed Care – PPO

## 2013-03-15 DIAGNOSIS — R600 Localized edema: Secondary | ICD-10-CM

## 2013-03-15 DIAGNOSIS — R609 Edema, unspecified: Secondary | ICD-10-CM | POA: Insufficient documentation

## 2013-03-15 DIAGNOSIS — Z79899 Other long term (current) drug therapy: Secondary | ICD-10-CM | POA: Insufficient documentation

## 2013-03-15 DIAGNOSIS — I10 Essential (primary) hypertension: Secondary | ICD-10-CM | POA: Insufficient documentation

## 2013-03-15 LAB — POCT I-STAT, CHEM 8
BUN: 16 mg/dL (ref 6–23)
Creatinine, Ser: 1.2 mg/dL (ref 0.50–1.35)
Hemoglobin: 13.6 g/dL (ref 13.0–17.0)
Potassium: 2.9 mEq/L — ABNORMAL LOW (ref 3.5–5.1)
Sodium: 145 mEq/L (ref 135–145)

## 2013-03-15 MED ORDER — ENOXAPARIN SODIUM 80 MG/0.8ML ~~LOC~~ SOLN
80.0000 mg | Freq: Once | SUBCUTANEOUS | Status: AC
  Administered 2013-03-16: 80 mg via SUBCUTANEOUS
  Filled 2013-03-15: qty 0.8

## 2013-03-15 NOTE — ED Notes (Signed)
MD at bedside. 

## 2013-03-15 NOTE — ED Notes (Signed)
Pt presents for evaluation of left lower leg swelling that began Tuesday.  Pt states he stands on his feet all day at work- denies having swelling to his legs before.  Denies any pain to the leg.

## 2013-03-15 NOTE — ED Provider Notes (Signed)
History    CSN: [id number] Arrival date & time 03/15/13  1831  First MD Initiated Contact with Patient 03/15/13 2201     Chief Complaint  Patient presents with  . Leg Swelling   (Consider location/radiation/quality/duration/timing/severity/associated sxs/prior Treatment) The history is provided by the patient.   patient with swelling in his left lower leg a few days. He states he has some pain in his left knee. No trauma. No chest pain. No trouble breathing. No fevers. He is not had swelling at this before. No difficulty walking. Past Medical History  Diagnosis Date  . Hypertension    Past Surgical History  Procedure Laterality Date  . Hernia repair     Family History  Problem Relation Age of Onset  . Hypertension Father   . Depression Brother    History  Substance Use Topics  . Smoking status: Never Smoker   . Smokeless tobacco: Not on file  . Alcohol Use: No    Review of Systems  Constitutional: Negative for activity change and appetite change.  HENT: Negative for neck stiffness.   Eyes: Negative for pain.  Respiratory: Negative for chest tightness and shortness of breath.   Cardiovascular: Positive for leg swelling. Negative for chest pain.  Gastrointestinal: Negative for nausea, vomiting, abdominal pain and diarrhea.  Genitourinary: Negative for flank pain.  Musculoskeletal: Negative for back pain.  Skin: Negative for rash.  Neurological: Negative for weakness, numbness and headaches.  Psychiatric/Behavioral: Negative for behavioral problems.    Allergies  Review of patient's allergies indicates no known allergies.  Home Medications   Current Outpatient Rx  Name  Route  Sig  Dispense  Refill  . amLODipine (NORVASC) 5 MG tablet   Oral   Take 5 mg by mouth every morning.         . benazepril-hydrochlorthiazide (LOTENSIN HCT) 20-25 MG per tablet   Oral   Take 1 tablet by mouth every morning.         . diphenhydrAMINE (BENADRYL) 25 mg capsule  Oral   Take 25 mg by mouth every 6 (six) hours as needed for allergies. For allergies         . metoprolol (TOPROL-XL) 200 MG 24 hr tablet   Oral   Take 200 mg by mouth every morning. Needs office visit for further refills          BP 141/98  Pulse 72  Temp(Src) 97.6 F (36.4 C) (Oral)  Resp 18  SpO2 99% Physical Exam  Nursing note and vitals reviewed. Constitutional: He is oriented to person, place, and time. He appears well-developed and well-nourished.  HENT:  Head: Normocephalic and atraumatic.  Eyes: EOM are normal. Pupils are equal, round, and reactive to light.  Neck: Normal range of motion. Neck supple.  Cardiovascular: Normal rate, regular rhythm and normal heart sounds.   No murmur heard. Pulmonary/Chest: Effort normal and breath sounds normal.  Abdominal: Soft. Bowel sounds are normal. He exhibits no distension and no mass. There is no tenderness. There is no rebound and no guarding.  Musculoskeletal: Normal range of motion. He exhibits edema.  Edema in left lower leg. Stops at the knee. He has a fullness behind the left knee. Neurovascularly distally. No rash. No erythema.  Neurological: He is alert and oriented to person, place, and time. No cranial nerve deficit.  Skin: Skin is warm and dry.  Psychiatric: He has a normal mood and affect.    ED Course  Procedures (including critical care time) Labs  Reviewed  POCT I-STAT, CHEM 8 - Abnormal; Notable for the following:    Potassium 2.9 (*)    Glucose, Bld 120 (*)    All other components within normal limits   Dg Knee Complete 4 Views Left  03/15/2013   *RADIOLOGY REPORT*  Clinical Data: Leg swelling.  No known injury.  LEFT KNEE - COMPLETE 4+ VIEW  Comparison: None.  Findings: No acute bony abnormality.  Specifically, no fracture, subluxation, or dislocation.  Soft tissues are intact.  No joint effusion.  Minimal spurring in the patellofemoral compartment.  IMPRESSION: No acute bony abnormality.   Original  Report Authenticated By: Charlett Nose, M.D.   1. Edema of left lower extremity     MDM  Patient with swelling of his leg. Negative x-ray. No difficult breathing. Minimally low potassium. Will followup tomorrow for Doppler  Juliet Rude. Rubin Payor, MD 03/17/13 [phone]

## 2013-03-15 NOTE — ED Notes (Signed)
Pt reports left leg swelling that started on Tuesday. Reports no hx of the same. Denies any SOB or chest pain. Denies any pain in the leg. Sensation intact.

## 2013-03-15 NOTE — ED Notes (Signed)
First vitals charted in error

## 2013-03-16 ENCOUNTER — Telehealth: Payer: Self-pay | Admitting: Radiology

## 2013-03-16 ENCOUNTER — Ambulatory Visit (HOSPITAL_COMMUNITY)
Admission: RE | Admit: 2013-03-16 | Discharge: 2013-03-16 | Disposition: A | Discharge status: Home / Self Care | Payer: BC Managed Care – PPO | Source: Ambulatory Visit | Attending: Emergency Medicine | Admitting: Emergency Medicine

## 2013-03-16 ENCOUNTER — Encounter (HOSPITAL_COMMUNITY): Payer: Self-pay | Admitting: Unknown Physician Specialty

## 2013-03-16 DIAGNOSIS — M7989 Other specified soft tissue disorders: Secondary | ICD-10-CM

## 2013-03-16 NOTE — Progress Notes (Signed)
*  Preliminary Results* Left lower extremity venous duplex completed. Left lower extremity is negative for deep vein thrombosis. There is no evidence of left Baker's cyst. There is a hypoechoic vs anechoic area of the proximal medial calf, suggestive of possible muscle tear vs. unknown etiology.  Preliminary results discussed with Amy of Dr.Lowenstein's office.  03/16/2013 10:25 AM  Gertie Fey, RVT, RDCS, RDMS

## 2013-03-16 NOTE — Telephone Encounter (Signed)
Patient had doppler study, negative for DVT, patient advised, he plans to follow up with you this w/e Dylan Gonzalez

## 2013-03-17 ENCOUNTER — Encounter: Payer: Self-pay | Admitting: Family Medicine

## 2013-03-17 ENCOUNTER — Ambulatory Visit: Payer: BC Managed Care – PPO | Admitting: Family Medicine

## 2013-03-17 VITALS — BP 152/97 | HR 73 | Temp 98.1°F | Resp 16 | Ht 72.0 in | Wt 189.6 lb

## 2013-03-17 DIAGNOSIS — I10 Essential (primary) hypertension: Secondary | ICD-10-CM

## 2013-03-17 DIAGNOSIS — R609 Edema, unspecified: Secondary | ICD-10-CM

## 2013-03-17 MED ORDER — METOPROLOL SUCCINATE ER 200 MG PO TB24: 200.0000 mg | ORAL_TABLET | Freq: Every day | ORAL | Status: DC

## 2013-03-17 MED ORDER — FUROSEMIDE 40 MG PO TABS: 40.0000 mg | ORAL_TABLET | Freq: Every day | ORAL | Status: DC

## 2013-03-17 MED ORDER — LOSARTAN POTASSIUM 100 MG PO TABS: 100.0000 mg | ORAL_TABLET | Freq: Every day | ORAL | Status: DC

## 2013-03-17 NOTE — Patient Instructions (Addendum)
Return next weekend for recheck of blood pressure

## 2013-03-17 NOTE — Progress Notes (Signed)
47 year old man with hypertension for 8 years, works at [employer], here for left leg edema and high blood pressure.    He was seen in ED two days ago and venous doppler war performed yesterday (normal).  Wife has been sick lately (she is also disabled)  Objective:  NAD 3+ LLE edema.  Nontender calf. Heart:  Regular, no murmur or gallop Chest:  Clear   Assessment:  Significant edema LLE with negative doppler.  Unsure why this has happened other than a possible side effect of the amlodipine  Plan: Stop losartan HCT and amlodipine Follow up 1 week Hypertension - Plan: losartan (COZAAR) 100 MG tablet, furosemide (LASIX) 40 MG tablet  Edema - Plan: furosemide (LASIX) 40 MG tablet  Signed, [name], MD

## 2013-03-17 NOTE — Addendum Note (Signed)
Addended by: [name] on: 03/17/2013 03:42 PM   Modules accepted: Orders

## 2013-04-16 ENCOUNTER — Emergency Department (HOSPITAL_COMMUNITY)
Admission: EM | Admit: 2013-04-16 | Discharge: 2013-04-17 | Disposition: A | Discharge status: Home Health | Payer: BC Managed Care – PPO | Attending: Emergency Medicine | Admitting: Emergency Medicine

## 2013-04-16 ENCOUNTER — Emergency Department (HOSPITAL_COMMUNITY): Payer: BC Managed Care – PPO

## 2013-04-16 ENCOUNTER — Encounter (HOSPITAL_COMMUNITY): Payer: Self-pay | Admitting: *Deleted

## 2013-04-16 DIAGNOSIS — R259 Unspecified abnormal involuntary movements: Secondary | ICD-10-CM | POA: Insufficient documentation

## 2013-04-16 DIAGNOSIS — R220 Localized swelling, mass and lump, head: Secondary | ICD-10-CM | POA: Insufficient documentation

## 2013-04-16 DIAGNOSIS — R509 Fever, unspecified: Secondary | ICD-10-CM | POA: Insufficient documentation

## 2013-04-16 DIAGNOSIS — K047 Periapical abscess without sinus: Secondary | ICD-10-CM | POA: Insufficient documentation

## 2013-04-16 DIAGNOSIS — R29810 Facial weakness: Secondary | ICD-10-CM | POA: Insufficient documentation

## 2013-04-16 DIAGNOSIS — K029 Dental caries, unspecified: Secondary | ICD-10-CM | POA: Insufficient documentation

## 2013-04-16 DIAGNOSIS — R221 Localized swelling, mass and lump, neck: Secondary | ICD-10-CM | POA: Insufficient documentation

## 2013-04-16 DIAGNOSIS — I10 Essential (primary) hypertension: Secondary | ICD-10-CM | POA: Insufficient documentation

## 2013-04-16 DIAGNOSIS — Z79899 Other long term (current) drug therapy: Secondary | ICD-10-CM | POA: Insufficient documentation

## 2013-04-16 LAB — POCT I-STAT, CHEM 8
BUN: 9 mg/dL (ref 6–23)
Chloride: 102 mEq/L (ref 96–112)
Potassium: 3.2 mEq/L — ABNORMAL LOW (ref 3.5–5.1)
Sodium: 144 mEq/L (ref 135–145)
TCO2: 29 mmol/L (ref 0–100)

## 2013-04-16 LAB — CBC WITH DIFFERENTIAL/PLATELET
Basophils Absolute: 0 10*3/uL (ref 0.0–0.1)
Lymphocytes Relative: 20 % (ref 12–46)
Neutro Abs: 6.6 10*3/uL (ref 1.7–7.7)
Neutrophils Relative %: 67 % (ref 43–77)
Platelets: 204 10*3/uL (ref 150–400)
RDW: 12.5 % (ref 11.5–15.5)
WBC: 9.8 10*3/uL (ref 4.0–10.5)

## 2013-04-16 MED ORDER — CLINDAMYCIN PHOSPHATE 600 MG/50ML IV SOLN
600.0000 mg | Freq: Once | INTRAVENOUS | Status: AC
  Administered 2013-04-16: 600 mg via INTRAVENOUS
  Filled 2013-04-16: qty 50

## 2013-04-16 MED ORDER — MORPHINE SULFATE 4 MG/ML IJ SOLN
4.0000 mg | Freq: Once | INTRAMUSCULAR | Status: AC
  Administered 2013-04-16: 4 mg via INTRAVENOUS
  Filled 2013-04-16: qty 1

## 2013-04-16 MED ORDER — KETOROLAC TROMETHAMINE 30 MG/ML IJ SOLN
60.0000 mg | Freq: Once | INTRAMUSCULAR | Status: AC
  Administered 2013-04-16: 60 mg via INTRAMUSCULAR
  Filled 2013-04-16: qty 2

## 2013-04-16 MED ORDER — OXYCODONE-ACETAMINOPHEN 5-325 MG PO TABS: 1.0000 | ORAL_TABLET | Freq: Four times a day (QID) | ORAL | Status: DC | PRN

## 2013-04-16 MED ORDER — ONDANSETRON HCL 4 MG/2ML IJ SOLN
4.0000 mg | Freq: Once | INTRAMUSCULAR | Status: AC
  Administered 2013-04-16: 4 mg via INTRAVENOUS
  Filled 2013-04-16: qty 2

## 2013-04-16 MED ORDER — IOHEXOL 300 MG/ML  SOLN
75.0000 mL | Freq: Once | INTRAMUSCULAR | Status: AC | PRN
  Administered 2013-04-16: 75 mL via INTRAVENOUS

## 2013-04-16 MED ORDER — CLINDAMYCIN HCL 150 MG PO CAPS: 150.0000 mg | ORAL_CAPSULE | Freq: Four times a day (QID) | ORAL | Status: DC

## 2013-04-16 NOTE — ED Provider Notes (Signed)
CSN: [id number]     Arrival date & time 04/16/13  1452 History     First MD Initiated Contact with Patient 04/16/13 1908     Chief Complaint  Patient presents with  . Jaw Pain   (Consider location/radiation/quality/duration/timing/severity/associated sxs/prior Treatment) HPI Comments: Dylan Gonzalez is a 47 y.o. Male presenting with a 4 day history of left sided dental and jaw pain along with swelling along his left upper dentition.  His pain has progressed in severity,stating he cannot open his jaw without severe pain.  He does have left upper dental decay without any dental injury.  He has had subjective fever and chills.  He denies headache,  Neck stiffness, cough or shortness of breath. He is able to eat but with discomfort and has taken his medicines, including his furosemide, cozaar and toprol this am.  He has taken tylenol and his wife gave him a flagyl this am which did not relieve his pain or swelling.      The history is provided by the patient.    Past Medical History  Diagnosis Date  . Hypertension    Past Surgical History  Procedure Laterality Date  . Hernia repair     Family History  Problem Relation Age of Onset  . Hypertension Father   . Depression Brother    History  Substance Use Topics  . Smoking status: Never Smoker   . Smokeless tobacco: Not on file  . Alcohol Use: No    Review of Systems  Constitutional: Positive for fever and chills.  HENT: Positive for dental problem. Negative for congestion, sore throat, trouble swallowing and neck pain.   Eyes: Negative.   Respiratory: Negative for cough, chest tightness and shortness of breath.   Cardiovascular: Negative for chest pain.  Gastrointestinal: Negative for nausea, vomiting and abdominal pain.  Genitourinary: Negative.   Musculoskeletal: Negative for joint swelling and arthralgias.  Skin: Negative.  Negative for rash and wound.  Neurological: Negative for dizziness, weakness, light-headedness,  numbness and headaches.  Psychiatric/Behavioral: Negative.     Allergies  Review of patient's allergies indicates no known allergies.  Home Medications   Current Outpatient Rx  Name  Route  Sig  Dispense  Refill  . acetaminophen (TYLENOL) 500 MG tablet   Oral   Take 1,000 mg by mouth daily as needed for pain.         . furosemide (LASIX) 40 MG tablet   Oral   Take 40 mg by mouth every morning.         Marland Kitchen losartan (COZAAR) 100 MG tablet   Oral   Take 100 mg by mouth every morning.         . metoprolol (TOPROL-XL) 200 MG 24 hr tablet   Oral   Take 200 mg by mouth daily.         . metroNIDAZOLE (FLAGYL) 500 MG tablet   Oral   Take 500 mg by mouth once.          BP 181/111  Pulse 57  Temp(Src) 98.1 F (36.7 C) (Oral)  Resp 16  SpO2 99% Physical Exam  Constitutional: He is oriented to person, place, and time. He appears well-developed and well-nourished. No distress.  Hypertensive.  HENT:  Head: Normocephalic and atraumatic. 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  Right Ear: Tympanic membrane, external ear and ear canal normal.  Left Ear: Tympanic membrane, external ear and ear canal normal.  Nose: Nose normal.  Mouth/Throat: Oropharynx is clear and  moist and mucous membranes are normal. No oral lesions. Dental abscesses present.  Patient has some mild increased swelling of left cheek without induration or erythema.  He is unable to open his jaw secondary to pain.  He has decay in his left upper 2nd premolar tooth with some surrounding gingival edema,  No obvious abscess.  Unable to assess posterior mouth and pharynx secondary to trismus.  Eyes: Conjunctivae are normal. Right eye exhibits no discharge.  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  Neck: Normal range of motion. Neck supple.  Cardiovascular: Normal rate and normal heart sounds.   Pulmonary/Chest: Effort normal. No stridor. He has no wheezes.  Abdominal: He  exhibits no distension.  Musculoskeletal: Normal range of motion.  Lymphadenopathy:    He has no cervical adenopathy.  Neurological: He is alert and oriented to person, place, and time.  Skin: Skin is warm and dry. No erythema.  Psychiatric: He has a normal mood and affect.    ED Course   Procedures (including critical care time)  Labs Reviewed  CBC WITH DIFFERENTIAL   No results found. No diagnosis found.  MDM  Pt discussed with Fayrene Helper, PA-C who will follow patient.  Pending Ct at this time.  Pt was given clindamycin IV while waiting for Ct scan.   Suspect pt will need f/u and/or consult with ent or oral surgery pending Ct results.  Burgess Amor, PA-C 04/16/13 2049

## 2013-04-16 NOTE — ED Notes (Addendum)
Pt states acute onset pain to L jaw when opening mouth x 4 days.  Pt is able to eat, but with pain. Acuity of 3 given for htn in triage.  Pt states he has been taking his bp meds.

## 2013-04-16 NOTE — ED Provider Notes (Signed)
Dental infection, with trismus.  L upper dentition x 4 days.  Awaits maxillofacial CT.  Currently on Clinda IV.    10:03 PM Patient reports marked improvement after receiving antibiotic and pain medication in ER. States he is able to take medication by mouth.  His blood pressure improves. He is afebrile. Maxillofacial CT shows evidence of periapical abscess as suspected.  Pt prefers to be discharge and will take antibiotic at home.  I will consult oral surgeon so pt can have close outpt f/u for further care.  Return precaution discussed.  Being that pt has trismus, it is difficult to evaluate fully, unable to r/o Ludwigs angina, but less likely since CT did not indicate as such.    11:40 PM I have consulted oral surgeon Dr. Jeanice Lim, who agrees to see pt outpt for further care.  Pt stable for discharge, return precaution discussed.    BP 146/98  Pulse 54  Temp(Src) 97.8 F (36.6 C) (Oral)  Resp 20  SpO2 98%  I have reviewed nursing notes and vital signs. I personally reviewed the imaging tests through PACS system  I reviewed available ER/hospitalization records thought the EMR  Results for orders placed during the hospital encounter of 04/16/13  CBC WITH DIFFERENTIAL      Result Value Range   WBC 9.8  4.0 - 10.5 K/uL   RBC 4.18 (*) 4.22 - 5.81 MIL/uL   Hemoglobin 13.2  13.0 - 17.0 g/dL   HCT 65.7  84.6 - 96.2 %   MCV 93.8  78.0 - 100.0 fL   MCH 31.6  26.0 - 34.0 pg   MCHC 33.7  30.0 - 36.0 g/dL   RDW 95.2  84.1 - 32.4 %   Platelets 204  150 - 400 K/uL   Neutrophils Relative % 67  43 - 77 %   Neutro Abs 6.6  1.7 - 7.7 K/uL   Lymphocytes Relative 20  12 - 46 %   Lymphs Abs 1.9  0.7 - 4.0 K/uL   Monocytes Relative 11  3 - 12 %   Monocytes Absolute 1.1 (*) 0.1 - 1.0 K/uL   Eosinophils Relative 1  0 - 5 %   Eosinophils Absolute 0.1  0.0 - 0.7 K/uL   Basophils Relative 0  0 - 1 %   Basophils Absolute 0.0  0.0 - 0.1 K/uL  POCT I-STAT, CHEM 8      Result Value Range   Sodium 144  135  - 145 mEq/L   Potassium 3.2 (*) 3.5 - 5.1 mEq/L   Chloride 102  96 - 112 mEq/L   BUN 9  6 - 23 mg/dL   Creatinine, Ser 4.01  0.50 - 1.35 mg/dL   Glucose, Bld 85  70 - 99 mg/dL   Calcium, Ion 0.27  2.53 - 1.23 mmol/L   TCO2 29  0 - 100 mmol/L   Hemoglobin 13.9  13.0 - 17.0 g/dL   HCT 66.4  40.3 - 47.4 %   Ct Maxillofacial W/cm  04/16/2013   *RADIOLOGY REPORT*  Clinical Data: Left-sided facial swelling and jaw pain.  Question abscess.  CT MAXILLOFACIAL WITH CONTRAST  Technique:  Multidetector CT imaging of the maxillofacial structures was performed with intravenous contrast. Multiplanar CT image reconstructions were also generated.  Contrast: 75mL OMNIPAQUE IOHEXOL 300 MG/ML  SOLN  Comparison: None.  Findings: Extensive inflammatory changes in the left submandibular and masticator spaces secondary to large periapical abscess around the left lower second molar, which is partly impacted.  There is breakthrough of the periapical erosion through the medial mandibular cortex, with decompression into the left pterygoid musculature, where there is a 16 mm diameter low density developing collection.  The margins are not particularly well defined on axial imaging, but appear more discrete and coronal reformats.  No marked rim enhancement, although mucosal enhancement is suboptimal.  There are numerous cavities, particularly severe in the molar teeth, both upper and lower.  There is expansile lesion associated with the left lower canine, with well-defined margins.  The lesion measures approximately 14 mm in diameter.  No unerupted tooth in the region. The neighboring tooth appears nonerroded.  No internal matrix.  No dominant internal enhancement.  There is near complete opacification of the left maxillary sinus, anterior ethmoid air cells, the left frontal sinus.  These changes are associated with scattered high attenuation material, inspissated secretions versus fungal colonization.  The pattern suggests  obstruction at the level of the middle meatus, where there is soft tissue fullness, possibly polyp.  No aggressive features such as osseous destruction.  Status post left eye enucleation and with prosthesis.  IMPRESSION:  1.  Odontogenic infection related to left lower second molar which has a large periapical abscess.  There is phlegmon or early abscess, 16 mm diameter, within the left lower pterygoid musculature. 2.  Lucent left mandibular lesion, along the buccal surface, 14 mm in diameter.  Differential considerations include radicular cyst, lateral periodontal cyst, or less likely keratocystic odontogenic tumor.  3. Left ostiomeatal complex pattern of sinus disease.  Recommend nasal endoscopy to evaluate for obstructing polyp or other mass.   Original Report Authenticated By: Jacqualine Mau, PA-C 04/16/13 2341

## 2013-04-17 NOTE — ED Provider Notes (Signed)
Medical screening examination/treatment/procedure(s) were performed by non-physician practitioner and as supervising physician I was immediately available for consultation/collaboration.   Audree Camel, MD 04/17/13 [phone]

## 2013-04-19 NOTE — ED Provider Notes (Signed)
Medical screening examination/treatment/procedure(s) were performed by non-physician practitioner and as supervising physician I was immediately available for consultation/collaboration.   Shelda Jakes, MD 04/19/13 [phone]

## 2013-04-21 ENCOUNTER — Encounter (HOSPITAL_COMMUNITY): Payer: Self-pay | Admitting: Certified Registered"

## 2013-04-21 ENCOUNTER — Emergency Department (HOSPITAL_COMMUNITY): Payer: BC Managed Care – PPO | Admitting: Certified Registered"

## 2013-04-21 ENCOUNTER — Inpatient Hospital Stay (HOSPITAL_COMMUNITY)
Admission: EM | Admit: 2013-04-21 | Discharge: 2013-04-24 | DRG: 168 | Disposition: A | Discharge status: Home / Self Care | Payer: BC Managed Care – PPO | Attending: Oral Surgery | Admitting: Oral Surgery

## 2013-04-21 ENCOUNTER — Encounter (HOSPITAL_COMMUNITY): Payer: Self-pay | Admitting: *Deleted

## 2013-04-21 ENCOUNTER — Emergency Department (HOSPITAL_COMMUNITY): Payer: BC Managed Care – PPO

## 2013-04-21 ENCOUNTER — Encounter (HOSPITAL_COMMUNITY)
Admission: EM | Disposition: A | Discharge status: Home / Self Care | Payer: Self-pay | Source: Home / Self Care | Attending: Oral Surgery

## 2013-04-21 DIAGNOSIS — K011 Impacted teeth: Secondary | ICD-10-CM

## 2013-04-21 DIAGNOSIS — M2740 Unspecified cyst of jaw: Secondary | ICD-10-CM

## 2013-04-21 DIAGNOSIS — L03211 Cellulitis of face: Secondary | ICD-10-CM | POA: Diagnosis present

## 2013-04-21 DIAGNOSIS — K047 Periapical abscess without sinus: Secondary | ICD-10-CM

## 2013-04-21 DIAGNOSIS — I10 Essential (primary) hypertension: Secondary | ICD-10-CM | POA: Diagnosis present

## 2013-04-21 DIAGNOSIS — K006 Disturbances in tooth eruption: Secondary | ICD-10-CM | POA: Diagnosis present

## 2013-04-21 DIAGNOSIS — M2749 Other cysts of jaw: Secondary | ICD-10-CM | POA: Diagnosis present

## 2013-04-21 DIAGNOSIS — L0201 Cutaneous abscess of face: Secondary | ICD-10-CM | POA: Diagnosis present

## 2013-04-21 DIAGNOSIS — Z79899 Other long term (current) drug therapy: Secondary | ICD-10-CM

## 2013-04-21 DIAGNOSIS — M272 Inflammatory conditions of jaws: Secondary | ICD-10-CM | POA: Diagnosis present

## 2013-04-21 DIAGNOSIS — K044 Acute apical periodontitis of pulpal origin: Principal | ICD-10-CM | POA: Diagnosis present

## 2013-04-21 DIAGNOSIS — K029 Dental caries, unspecified: Secondary | ICD-10-CM | POA: Diagnosis present

## 2013-04-21 DIAGNOSIS — C6922 Malignant neoplasm of left retina: Secondary | ICD-10-CM

## 2013-04-21 HISTORY — PX: TOOTH EXTRACTION: SHX859

## 2013-04-21 LAB — BASIC METABOLIC PANEL
CO2: 31 mEq/L (ref 19–32)
Chloride: 103 mEq/L (ref 96–112)
GFR calc non Af Amer: 73 mL/min — ABNORMAL LOW (ref >90)
Glucose, Bld: 83 mg/dL (ref 70–99)
Potassium: 3.4 mEq/L — ABNORMAL LOW (ref 3.5–5.1)
Sodium: 143 mEq/L (ref 135–145)

## 2013-04-21 SURGERY — EXTRACTION, TOOTH, MOLAR
Anesthesia: General | Site: Mouth | Laterality: Right | Wound class: Dirty or Infected

## 2013-04-21 MED ORDER — PROPOFOL 10 MG/ML IV BOLUS
INTRAVENOUS | Status: DC | PRN
  Administered 2013-04-21: 200 mg via INTRAVENOUS

## 2013-04-21 MED ORDER — OXYCODONE HCL 5 MG PO TABS
5.0000 mg | ORAL_TABLET | ORAL | Status: DC | PRN
  Administered 2013-04-22 – 2013-04-24 (×6): 5 mg via ORAL
  Filled 2013-04-21 (×6): qty 1

## 2013-04-21 MED ORDER — SUCCINYLCHOLINE CHLORIDE 20 MG/ML IJ SOLN
INTRAMUSCULAR | Status: DC | PRN
  Administered 2013-04-21: 125 mg via INTRAVENOUS

## 2013-04-21 MED ORDER — MIDAZOLAM HCL 5 MG/5ML IJ SOLN
INTRAMUSCULAR | Status: DC | PRN
  Administered 2013-04-21: 2 mg via INTRAVENOUS

## 2013-04-21 MED ORDER — DEXAMETHASONE SODIUM PHOSPHATE 4 MG/ML IJ SOLN
INTRAMUSCULAR | Status: DC | PRN
  Administered 2013-04-21: 4 mg via INTRAVENOUS

## 2013-04-21 MED ORDER — SODIUM CHLORIDE 0.9 % IV SOLN
3.0000 g | Freq: Once | INTRAVENOUS | Status: AC
  Administered 2013-04-21: 3 g via INTRAVENOUS
  Filled 2013-04-21: qty 3

## 2013-04-21 MED ORDER — ONDANSETRON HCL 4 MG/2ML IJ SOLN: 4.0000 mg | Freq: Four times a day (QID) | INTRAMUSCULAR | Status: DC | PRN

## 2013-04-21 MED ORDER — LIDOCAINE-EPINEPHRINE 2 %-1:100000 IJ SOLN
INTRAMUSCULAR | Status: DC | PRN
  Administered 2013-04-21: 10 mL

## 2013-04-21 MED ORDER — ONDANSETRON HCL 4 MG/2ML IJ SOLN
4.0000 mg | Freq: Once | INTRAMUSCULAR | Status: AC
  Administered 2013-04-21: 4 mg via INTRAVENOUS
  Filled 2013-04-21: qty 2

## 2013-04-21 MED ORDER — DIPHENHYDRAMINE HCL 12.5 MG/5ML PO ELIX: 12.5000 mg | ORAL_SOLUTION | Freq: Four times a day (QID) | ORAL | Status: DC | PRN

## 2013-04-21 MED ORDER — ONDANSETRON HCL 4 MG/2ML IJ SOLN
INTRAMUSCULAR | Status: DC | PRN
  Administered 2013-04-21: 4 mg via INTRAVENOUS

## 2013-04-21 MED ORDER — GLYCOPYRROLATE 0.2 MG/ML IJ SOLN
INTRAMUSCULAR | Status: DC | PRN
  Administered 2013-04-21 (×2): 0.2 mg via INTRAVENOUS

## 2013-04-21 MED ORDER — LIDOCAINE HCL (CARDIAC) 20 MG/ML IV SOLN
INTRAVENOUS | Status: DC | PRN
  Administered 2013-04-21: 100 mg via INTRAVENOUS

## 2013-04-21 MED ORDER — OXYCODONE HCL 5 MG/5ML PO SOLN: 5.0000 mg | Freq: Once | ORAL | Status: DC | PRN

## 2013-04-21 MED ORDER — ACETAMINOPHEN 650 MG RE SUPP: 650.0000 mg | Freq: Four times a day (QID) | RECTAL | Status: DC | PRN

## 2013-04-21 MED ORDER — ONDANSETRON HCL 4 MG/2ML IJ SOLN: 4.0000 mg | Freq: Once | INTRAMUSCULAR | Status: DC | PRN

## 2013-04-21 MED ORDER — SODIUM CHLORIDE 0.9 % IV SOLN
3.0000 g | Freq: Four times a day (QID) | INTRAVENOUS | Status: DC
  Administered 2013-04-22 – 2013-04-24 (×10): 3 g via INTRAVENOUS
  Filled 2013-04-21 (×12): qty 3

## 2013-04-21 MED ORDER — LIDOCAINE HCL 4 % MT SOLN
OROMUCOSAL | Status: DC | PRN
  Administered 2013-04-21: 4 mL via TOPICAL

## 2013-04-21 MED ORDER — DEXAMETHASONE SODIUM PHOSPHATE 10 MG/ML IJ SOLN
10.0000 mg | Freq: Once | INTRAMUSCULAR | Status: AC
  Administered 2013-04-21: 10 mg via INTRAVENOUS

## 2013-04-21 MED ORDER — KETOROLAC TROMETHAMINE 30 MG/ML IJ SOLN
30.0000 mg | Freq: Once | INTRAMUSCULAR | Status: AC
  Administered 2013-04-21: 30 mg via INTRAVENOUS
  Filled 2013-04-21: qty 1

## 2013-04-21 MED ORDER — LACTATED RINGERS IV SOLN
INTRAVENOUS | Status: DC | PRN
  Administered 2013-04-21 (×2): via INTRAVENOUS

## 2013-04-21 MED ORDER — ACETAMINOPHEN 325 MG PO TABS: 650.0000 mg | ORAL_TABLET | Freq: Four times a day (QID) | ORAL | Status: DC | PRN

## 2013-04-21 MED ORDER — SODIUM CHLORIDE 0.9 % IR SOLN
Status: DC | PRN
  Administered 2013-04-21: 1000 mL

## 2013-04-21 MED ORDER — HYDROMORPHONE HCL PF 1 MG/ML IJ SOLN
1.0000 mg | INTRAMUSCULAR | Status: DC | PRN
  Administered 2013-04-22: 1 mg via INTRAVENOUS
  Filled 2013-04-21: qty 1

## 2013-04-21 MED ORDER — DIPHENHYDRAMINE HCL 50 MG/ML IJ SOLN: 12.5000 mg | Freq: Four times a day (QID) | INTRAMUSCULAR | Status: DC | PRN

## 2013-04-21 MED ORDER — SUFENTANIL CITRATE 50 MCG/ML IV SOLN
INTRAVENOUS | Status: DC | PRN
  Administered 2013-04-21: 20 ug via INTRAVENOUS
  Administered 2013-04-21: 10 ug via INTRAVENOUS

## 2013-04-21 MED ORDER — KCL IN DEXTROSE-NACL 10-5-0.45 MEQ/L-%-% IV SOLN
INTRAVENOUS | Status: DC
  Administered 2013-04-22: 02:00:00 via INTRAVENOUS
  Filled 2013-04-21 (×4): qty 1000

## 2013-04-21 MED ORDER — OXYCODONE HCL 5 MG PO TABS: 5.0000 mg | ORAL_TABLET | Freq: Once | ORAL | Status: DC | PRN

## 2013-04-21 MED ORDER — MORPHINE SULFATE 4 MG/ML IJ SOLN
4.0000 mg | INTRAMUSCULAR | Status: DC | PRN
  Administered 2013-04-21 – 2013-04-22 (×2): 4 mg via INTRAVENOUS
  Filled 2013-04-21 (×2): qty 1

## 2013-04-21 MED ORDER — IOHEXOL 300 MG/ML  SOLN
75.0000 mL | Freq: Once | INTRAMUSCULAR | Status: AC | PRN
  Administered 2013-04-21: 75 mL via INTRAVENOUS

## 2013-04-21 MED ORDER — HYDROMORPHONE HCL PF 1 MG/ML IJ SOLN: 0.2500 mg | INTRAMUSCULAR | Status: DC | PRN

## 2013-04-21 SURGICAL SUPPLY — 36 items
BLADE SURG 15 STRL LF DISP TIS (BLADE) IMPLANT
BLADE SURG 15 STRL SS (BLADE)
BUR CROSS CUT (BURR)
BUR CROSS CUT FISSURE 1.6 (BURR) ×2 IMPLANT
BUR SRG MED 1.6XXCUT FSSR (BURR) IMPLANT
BUR SRG MED 2.1XXCUT FSSR (BURR) IMPLANT
BURR SRG MED 1.6XXCUT FSSR (BURR)
BURR SRG MED 2.1XXCUT FSSR (BURR)
CANISTER SUCTION 2500CC (MISCELLANEOUS) ×2 IMPLANT
CLOTH BEACON ORANGE TIMEOUT ST (SAFETY) ×2 IMPLANT
COVER SURGICAL LIGHT HANDLE (MISCELLANEOUS) ×2 IMPLANT
DECANTER SPIKE VIAL GLASS SM (MISCELLANEOUS) ×2 IMPLANT
GAUZE PACKING FOLDED 2  STR (GAUZE/BANDAGES/DRESSINGS) ×1
GAUZE PACKING FOLDED 2 STR (GAUZE/BANDAGES/DRESSINGS) ×1 IMPLANT
GAUZE SPONGE 4X4 16PLY XRAY LF (GAUZE/BANDAGES/DRESSINGS) IMPLANT
GLOVE BIO SURGEON STRL SZ 6.5 (GLOVE) ×2 IMPLANT
GLOVE BIO SURGEON STRL SZ7.5 (GLOVE) ×2 IMPLANT
GLOVE BIOGEL PI IND STRL 7.0 (GLOVE) ×1 IMPLANT
GLOVE BIOGEL PI INDICATOR 7.0 (GLOVE) ×1
GOWN STRL NON-REIN LRG LVL3 (GOWN DISPOSABLE) ×4 IMPLANT
GOWN STRL REIN XL XLG (GOWN DISPOSABLE) ×2 IMPLANT
KIT BASIN OR (CUSTOM PROCEDURE TRAY) ×2 IMPLANT
KIT ROOM TURNOVER OR (KITS) ×2 IMPLANT
NDL BLUNT 16X1.5 OR ONLY (NEEDLE) ×1 IMPLANT
NEEDLE 22X1 1/2 (OR ONLY) (NEEDLE) ×2 IMPLANT
NEEDLE BLUNT 16X1.5 OR ONLY (NEEDLE) ×2 IMPLANT
NS IRRIG 1000ML POUR BTL (IV SOLUTION) ×2 IMPLANT
PAD ARMBOARD 7.5X6 YLW CONV (MISCELLANEOUS) ×4 IMPLANT
SUT CHROMIC 3 0 PS 2 (SUTURE) ×4 IMPLANT
SYR 50ML SLIP (SYRINGE) ×2 IMPLANT
TOWEL OR 17X26 10 PK STRL BLUE (TOWEL DISPOSABLE) ×2 IMPLANT
TRAY ENT MC OR (CUSTOM PROCEDURE TRAY) ×2 IMPLANT
TUBE CONNECTING 12X1/4 (SUCTIONS) IMPLANT
TUBING IRRIGATION (MISCELLANEOUS) IMPLANT
WATER STERILE IRR 1000ML POUR (IV SOLUTION) ×2 IMPLANT
YANKAUER SUCT BULB TIP NO VENT (SUCTIONS) ×2 IMPLANT

## 2013-04-21 NOTE — Op Note (Signed)
04/21/2013  10:48 PM  PATIENT:  Dylan Gonzalez  47 y.o. male  PRE-OPERATIVE DIAGNOSIS:  Infected tooth #17, Left pterygoid space infection, non-restorable tocth # 22 and associated cyst   POST-OPERATIVE DIAGNOSIS:  SAME + cyst tooth # 17  PROCEDURE:  Procedure(s): EXTRACTION  TEETH #'S 17, 32, Incision and drainage left pterygoid space abscess, removal cyst tooth # 17, Removal cyst tooth # 22  SURGEON:  Surgeon(s): [name], DDS  ANESTHESIA:   local and general  EBL:  minimal  DRAINS: Penrose drain left pterygoid space abscess  SPECIMEN: Cyst tooth # 17, cyst tooth # 22  Cultures: Aerobic/anaerobic left pterygoid space abscess  COUNTS:  YES  PLAN OF CARE: Discharge to home after PACU  PATIENT DISPOSITION:  PACU - hemodynamically stable.   PROCEDURE DETAILS: Dictation #[id number]  [name], DMD 04/21/2013 10:48 PM

## 2013-04-21 NOTE — Op Note (Signed)
Dylan Gonzalez, Dylan Gonzalez               ACCOUNT NO.:  [id number]  MEDICAL RECORD NO.:  [id number]  LOCATION:  [facility]                         FACILITY:  [facility]  PHYSICIAN:  [name], M.D.  DATE OF BIRTH:  04/23/1966  DATE OF PROCEDURE:  04/21/2013 DATE OF DISCHARGE:                              OPERATIVE REPORT   PREOPERATIVE DIAGNOSES:  Infected tooth #17, left pterygoid space infection, nonrestorable tooth #22.  POSTOPERATIVE DIAGNOSES:  Infected tooth #17, left pterygoid space infection, nonrestorable tooth #22.  Cyst, tooth #17.  PROCEDURE:  Extraction of teeth #17 and 32.  Incision and drainage of left pterygoid space abscess.  Removal of cyst, tooth #17.  Removal of cyst, tooth #22.  SURGEON:  [name], M.D.  ANESTHESIA:  General.  Dr. Ivin Booty attending, oral intubation.  PROCEDURE:  The patient was taken to the operating room and placed on the table in supine position.  General anesthesia was administered intravenously and an oral endotracheal tube was placed and secured.  The eyes were protected.  The patient was prepped and draped for the procedure.  Time-out was performed.  Posterior pharynx was suctioned and a throat pack was placed.  A mouth prop was placed on the right side of the mouth and 2% lidocaine with 1:100,000 epinephrine was infiltrated in the inferior alveolar block on the left side, and around tooth #17 and #22, a total of 10 mL was utilized.  A 15 blade was used to make an incision overlying impacted tooth #17, carried anteriorly to the mesial aspect of tooth #18.  Another incision was made at the medial aspect of the ascending ramus and a curved Kelly hemostat was placed into this area.  Immediately, a copious amount of purulent material was expressed. Aerobic and anaerobic cultures were then obtained as well as Gram stain. Then, the hemostats were spread to open up any loculations of the pterygoid space abscess.  Then, the periosteum was reflected  around tooth #17.  The tooth was elevated with a 301 elevator, a curette was used to curette the cystic area from the apical portion of the tooth and the cyst was submitted for pathological examination.  Then, a quarter- inch Penrose drain was placed in the left pterygoid space abscess after irrigating and it was sutured to the mucosa with 3-0 silk.  Then, a 15- blade was used to make an incision around tooth #22 carrying posteriorly to tooth #21 area on the buccal aspect and to tooth #23 mesially with a vertical releasing incision in this area.  Then, the periosteum was reflected.  Tooth #23 was removed with the Asch forceps and then the socket was curetted and the cyst was removed in the lateral space and submitted for pathological examination.  Then, this area was irrigated and closed with 3-0 chromic.  The oral cavity was then irrigated copiously.  The throat pack was removed.  The patient was awakened and taken to the recovery room, breathing spontaneously in good condition.  ESTIMATED BLOOD LOSS:  Minimum.  COMPLICATIONS:  None.  DRAINS:  Penrose drain, left pterygoid space abscess.  SPECIMENS:  Cyst, tooth #17 scissors and cyst, tooth #22.  CULTURES:  Aerobic and  anaerobic pterygoid space abscess, left mandible.     [name], M.D.     SMJ/MEDQ  D:  04/21/2013  T:  04/21/2013  Job:  [id number]

## 2013-04-21 NOTE — Anesthesia Postprocedure Evaluation (Signed)
  Anesthesia Post-op Note  Patient: Dylan Gonzalez  Procedure(s) Performed: Procedure(s): EXTRACTION MOLARS (Right)  Patient Location: PACU  Anesthesia Type:General  Level of Consciousness: awake, alert  and oriented  Airway and Oxygen Therapy: Patient Spontanous Breathing and Patient connected to nasal cannula oxygen  Post-op Pain: mild  Post-op Assessment: Post-op Vital signs reviewed  Post-op Vital Signs: Reviewed  Complications: No apparent anesthesia complications

## 2013-04-21 NOTE — ED Notes (Addendum)
Pt here for same complaint recently. Since dc pt reports increase of swelling to left side of face. Pt also reports that this AM "I felt like my throat was tight." Pt denies any difficulty breathing, speaks in complete sentences. Pt has prosthetic left eye, so unable to assess blurred vision. Denies on right eye. Pt saw oral surgeon who will extract 3 teeth after infection has resolved.

## 2013-04-21 NOTE — ED Provider Notes (Signed)
CSN: [id number]     Arrival date & time 04/21/13  1423 History     First MD Initiated Contact with Patient 04/21/13 [phone]     Chief Complaint  Patient presents with  . Dental Pain  . Oral Swelling    Patient is a 47 y.o. male presenting with tooth pain.  Dental Pain Associated symptoms: facial swelling and neck pain   Associated symptoms: no drooling, no fever, no headaches and no oral lesions    He is evaluated on the 11th is given IV antibiotics given a prescription for additional antibiotics. He saw his dentist. Was asked to  continue his medications.  He cannot drink he cannot swallow he is also short of breath.  He states the discomfort his breathing is related to not being able to open his mouth.  Going into his face and left side of his neck Past Medical History  Diagnosis Date  . Hypertension    Past Surgical History  Procedure Laterality Date  . Hernia repair     Family History  Problem Relation Age of Onset  . Hypertension Father   . Depression Brother    History  Substance Use Topics  . Smoking status: Never Smoker   . Smokeless tobacco: Not on file  . Alcohol Use: No    Review of Systems  Constitutional: Negative for fever, chills, diaphoresis, appetite change and fatigue.  HENT: Positive for facial swelling, trouble swallowing and neck pain. Negative for sore throat, drooling, mouth sores and voice change.   Eyes: Negative for visual disturbance.  Respiratory: Negative for cough, chest tightness, shortness of breath and wheezing.   Cardiovascular: Negative for chest pain.  Gastrointestinal: Negative for nausea, vomiting, abdominal pain, diarrhea and abdominal distention.  Endocrine: Negative for polydipsia, polyphagia and polyuria.  Genitourinary: Negative for dysuria, frequency and hematuria.  Musculoskeletal: Negative for gait problem.  Skin: Negative for color change, pallor and rash.  Neurological: Negative for dizziness, syncope, light-headedness and  headaches.  Hematological: Does not bruise/bleed easily.  Psychiatric/Behavioral: Negative for behavioral problems and confusion.    Allergies  Review of patient's allergies indicates no known allergies.  Home Medications   Current Outpatient Rx  Name  Route  Sig  Dispense  Refill  . clindamycin (CLEOCIN) 150 MG capsule   Oral   Take 1 capsule (150 mg total) by mouth every 6 (six) hours.   28 capsule   0   . diphenhydrAMINE (BENADRYL) 25 MG tablet   Oral   Take 25 mg by mouth at bedtime as needed for sleep.         . furosemide (LASIX) 40 MG tablet   Oral   Take 40 mg by mouth every morning.         Marland Kitchen ibuprofen (ADVIL,MOTRIN) 800 MG tablet   Oral   Take 800 mg by mouth every 8 (eight) hours as needed for pain.         Marland Kitchen losartan (COZAAR) 100 MG tablet   Oral   Take 100 mg by mouth every morning.         . metoprolol (TOPROL-XL) 200 MG 24 hr tablet   Oral   Take 200 mg by mouth daily.         Marland Kitchen oxyCODONE-acetaminophen (PERCOCET/ROXICET) 5-325 MG per tablet   Oral   Take 1-2 tablets by mouth every 6 (six) hours as needed for pain.   20 tablet   0    BP 163/101  Pulse 63  Temp(Src) 97.7 F (36.5 C) (Oral)  Resp 16  SpO2 95% Physical Exam  Constitutional: He is oriented to person, place, and time. He appears well-developed and well-nourished. No distress.  HENT:  HEENT. He is trismus less than 1 cm. His external facial swelling along the ramus of the mandible. Showing extends onto the left malar eminence. 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 He has soft tissue swelling posterior to his left mandibular third molar. The tongue is not elevated. He is not drooling or stridor his previous soft tissue swelling in the lateral neck. The central neck is not edematous  Eyes: No scleral icterus.  Neck: No thyromegaly present.  Cardiovascular: Normal rate and regular rhythm.  Exam reveals no gallop and  no friction rub.   No murmur heard. Pulmonary/Chest: Effort normal and breath sounds normal. No respiratory distress. He has no wheezes. He has no rales.  Abdominal: Soft. Bowel sounds are normal. He exhibits no distension. There is no tenderness. There is no rebound.  Musculoskeletal: Normal range of motion.  Neurological: He is alert and oriented to person, place, and time.  Skin: Skin is warm and dry. No rash noted.  Psychiatric: He has a normal mood and affect. His behavior is normal.    ED Course   Procedures (including critical care time)  Labs Reviewed  BASIC METABOLIC PANEL - Abnormal; Notable for the following:    Potassium 3.4 (*)    GFR calc non Af Amer 73 (*)    GFR calc Af Amer 85 (*)    All other components within normal limits   Ct Soft Tissue Neck W Contrast  04/21/2013   *RADIOLOGY REPORT*  Clinical Data: 47 year old male with recent odontogenic infection/abscess status post IV antibiotics.  Increased left face swelling.  CT NECK WITH CONTRAST  Technique:  Multidetector CT imaging of the neck was performed with intravenous contrast.  Contrast: 75mL OMNIPAQUE IOHEXOL 300 MG/ML  SOLN  Comparison: Face CT with contrast 04/16/2013.  Findings: Negative lung apices.  Negative superior mediastinum. Negative thyroid.  Negative thoracic inlet aside from a 6 mm dense nodular soft tissue calcifications, etiology unclear but significance doubtful - these may represent chronically calcified/postinflammatory nodes.  No level IV lymphadenopathy.  Major vascular structures in the neck including the bilateral internal jugular veins remain patent.  Major vascular structures at the skull base remain patent.  Negative visualized brain parenchyma.  Small retropharyngeal effusion is increased (series 4 image 61). Continued low density and soft tissue swelling along the left oral pharyngeal wall (series 4 image 60).  No significant mass effect on the airway at the level.  Associated moderate  effacement of the left vallecula and piriform sinus.  Stable epiglottis, but the left aryepiglottic fold does appear more thickened.  The airway is effaced at the level of the false cords, but the glottis appears closed.  Worsening expansion and heterogeneous enhancement of the left pterygoid musculature posterior to the left retromolar trigone (series 4 image 46).  This probable intramuscular developing abscess encompasses 26-28 mm diameter.  Extensive lucency re- identified surrounding the left mandible wisdom tooth with large dental caries.  Abnormal expansion and cortical breakthrough of the medial mandible (see series 5 images 54 and 55).  Stable dentition elsewhere.  No pathologic fracture identified.  Secondary involvement of the left submandibular gland, but glandular enhancement remained fairly normal.  A small level I and up to 12 mm  left level II lymph nodes are stable.  The superficial aspect of the left masticator spaces relatively spared. Inflammation tracks to the left parapharyngeal space.  No definite tonsillar space abscess, postinflammatory tonsillar calcifications re-identified.  The sublingual space is spared. Negative right submandibular and bilateral parotid glands.  Stable orbits, left orbital prosthesis.  Stable paranasal sinuses with a mix of low density and high density sinus contents. Tympanic cavities and mastoids remain clear.  IMPRESSION: 1.  Progressed trans-spatial infection of the left face and neck, epicenter at the left pterygoid muscles where a developing 28 mm diameter intramuscular abscess is suspected (see series 4 image 46).  Increased inflammation and swelling of the left lateral pharyngeal wall, left hypopharynx and left aryepiglottic fold. Airway effacement at the level of the false cords. 2.  Infection origin appears to be the left mandible wisdom tooth, with changes compatible with focal mandibular osteomyelitis. 3.  Trace retropharyngeal effusion.  Negative thoracic  inlet and superior mediastinum.  Study discussed by telephone with Dr. Rolland Porter on 04/21/2013 at 1825 hours.   Original Report Authenticated By: Erskine Speed, M.D.   No diagnosis found.  MDM  CT scan shows osteomyelitis of the left mandible. Abscesses he is originally from the left third molar tooth #17. Abscesses in the masticator space pterygoid space. Placed a call to oral surgery on call. Dr. Barbette Merino responded to my call. He is requesting evaluation. While here the patient was given IV Decadron. Unasyn. Toradol. Morphine. Zofran.  Claudean Kinds, MD 04/21/13 2021

## 2013-04-21 NOTE — H&P (Signed)
HISTORY AND PHYSICAL  Dylan Gonzalez is a 47 y.o. male patient with CC: Left jaw pain and swelling, difficulty opening mouth.  HPI Patient began having pain 8 days ago. Was seen in ER previously and given rx for cleocin. General dentist evaluated and recommended extraction of teeth # 1, 3, and 17. Patient referred to oral surgeon but didn't see yet.  1. Facial abscess     Past Medical History  Diagnosis Date  . Hypertension     Current Facility-Administered Medications  Medication Dose Route Frequency Provider Last Rate Last Dose  . morphine 4 MG/ML injection 4 mg  4 mg Intravenous Q1H PRN Claudean Kinds, MD   4 mg at 04/21/13 1552   Current Outpatient Prescriptions  Medication Sig Dispense Refill  . clindamycin (CLEOCIN) 150 MG capsule Take 1 capsule (150 mg total) by mouth every 6 (six) hours.  28 capsule  0  . diphenhydrAMINE (BENADRYL) 25 MG tablet Take 25 mg by mouth at bedtime as needed for sleep.      . furosemide (LASIX) 40 MG tablet Take 40 mg by mouth every morning.      Marland Kitchen ibuprofen (ADVIL,MOTRIN) 800 MG tablet Take 800 mg by mouth every 8 (eight) hours as needed for pain.      Marland Kitchen losartan (COZAAR) 100 MG tablet Take 100 mg by mouth every morning.      . metoprolol (TOPROL-XL) 200 MG 24 hr tablet Take 200 mg by mouth daily.      Marland Kitchen oxyCODONE-acetaminophen (PERCOCET/ROXICET) 5-325 MG per tablet Take 1-2 tablets by mouth every 6 (six) hours as needed for pain.  20 tablet  0   No Known Allergies Active Problems:   * No active hospital problems. *  Vitals: Blood pressure 163/101, pulse 63, temperature 97.7 F (36.5 C), temperature source Oral, resp. rate 16, SpO2 95.00%. Lab results: Results for orders placed during the hospital encounter of 04/21/13 (from the past 24 hour(s))  BASIC METABOLIC PANEL     Status: Abnormal   Collection Time    04/21/13  3:55 PM      Result Value Range   Sodium 143  135 - 145 mEq/L   Potassium 3.4 (*) 3.5 - 5.1 mEq/L   Chloride 103  96 -  112 mEq/L   CO2 31  19 - 32 mEq/L   Glucose, Bld 83  70 - 99 mg/dL   BUN 8  6 - 23 mg/dL   Creatinine, Ser 5.78  0.50 - 1.35 mg/dL   Calcium 9.2  8.4 - 46.9 mg/dL   GFR calc non Af Amer 73 (*) >90 mL/min   GFR calc Af Amer 85 (*) >90 mL/min   Radiology Results: Ct Soft Tissue Neck W Contrast  04/21/2013   *RADIOLOGY REPORT*  Clinical Data: 47 year old male with recent odontogenic infection/abscess status post IV antibiotics.  Increased left face swelling.  CT NECK WITH CONTRAST  Technique:  Multidetector CT imaging of the neck was performed with intravenous contrast.  Contrast: 75mL OMNIPAQUE IOHEXOL 300 MG/ML  SOLN  Comparison: Face CT with contrast 04/16/2013.  Findings: Negative lung apices.  Negative superior mediastinum. Negative thyroid.  Negative thoracic inlet aside from a 6 mm dense nodular soft tissue calcifications, etiology unclear but significance doubtful - these may represent chronically calcified/postinflammatory nodes.  No level IV lymphadenopathy.  Major vascular structures in the neck including the bilateral internal jugular veins remain patent.  Major vascular structures at the skull base remain patent.  Negative visualized  brain parenchyma.  Small retropharyngeal effusion is increased (series 4 image 61). Continued low density and soft tissue swelling along the left oral pharyngeal wall (series 4 image 60).  No significant mass effect on the airway at the level.  Associated moderate effacement of the left vallecula and piriform sinus.  Stable epiglottis, but the left aryepiglottic fold does appear more thickened.  The airway is effaced at the level of the false cords, but the glottis appears closed.  Worsening expansion and heterogeneous enhancement of the left pterygoid musculature posterior to the left retromolar trigone (series 4 image 46).  This probable intramuscular developing abscess encompasses 26-28 mm diameter.  Extensive lucency re- identified surrounding the left mandible  wisdom tooth with large dental caries.  Abnormal expansion and cortical breakthrough of the medial mandible (see series 5 images 54 and 55).  Stable dentition elsewhere.  No pathologic fracture identified.  Secondary involvement of the left submandibular gland, but glandular enhancement remained fairly normal.  A small level I and up to 12 mm left level II lymph nodes are stable.  The superficial aspect of the left masticator spaces relatively spared. Inflammation tracks to the left parapharyngeal space.  No definite tonsillar space abscess, postinflammatory tonsillar calcifications re-identified.  The sublingual space is spared. Negative right submandibular and bilateral parotid glands.  Stable orbits, left orbital prosthesis.  Stable paranasal sinuses with a mix of low density and high density sinus contents. Tympanic cavities and mastoids remain clear.  IMPRESSION: 1.  Progressed trans-spatial infection of the left face and neck, epicenter at the left pterygoid muscles where a developing 28 mm diameter intramuscular abscess is suspected (see series 4 image 46).  Increased inflammation and swelling of the left lateral pharyngeal wall, left hypopharynx and left aryepiglottic fold. Airway effacement at the level of the false cords. 2.  Infection origin appears to be the left mandible wisdom tooth, with changes compatible with focal mandibular osteomyelitis. 3.  Trace retropharyngeal effusion.  Negative thoracic inlet and superior mediastinum.  Study discussed by telephone with Dr. Rolland Porter on 04/21/2013 at 1825 hours.   Original Report Authenticated By: Erskine Speed, M.D.   General appearance: alert, cooperative and mild distress Head: Normocephalic, without obvious abnormality, atraumatic Eyes: left eye prosthesis, right eye full rom Ears: normal TM's and external ear canals both ears Nose: Nares normal. Septum midline. Mucosa normal. No drainage or sinus tenderness. Throat: tender swelling left mandible.  Trismus to 2 mm. unable to visualize pharynx Neck: no adenopathy, supple, symmetrical, trachea midline, thyroid not enlarged, symmetric, no tenderness/mass/nodules and tender submandibular left Resp: clear to auscultation bilaterally Cardio: regular rate and rhythm, S1, S2 normal, no murmur, click, rub or gallop GI: soft, non-tender; bowel sounds normal; no masses,  no organomegaly  Assessment:47 YOBM HTN with left submandibular/pteragoid space infection subsequent to impacted abscessed tooth # 17. Non-restorable tooth # 22 secondary to dental caries with periapical cyst.   Plan: To OR. Extract teeth #s 17, and 22. Incision and drainage left mandible.Admit for IV antibiotics   Lemmie Steinhaus M 04/21/2013

## 2013-04-21 NOTE — ED Notes (Signed)
Pt was here on 8/11 for dental abscess and received iv antibiotics and given prescription for antibiotics. Reports no relief from abscess, thinks he may be allergic to meds due to increase in swelling to face and tongue. Airway intact, speaking in full sentences at this time.

## 2013-04-21 NOTE — Anesthesia Preprocedure Evaluation (Addendum)
Anesthesia Evaluation  Patient identified by MRN, date of birth, ID band Patient awake    Reviewed: Allergy & Precautions, H&P , NPO status , Patient's Chart, lab work & pertinent test results, reviewed documented beta blocker date and time   Airway Mallampati: II  Neck ROM: Full  Mouth opening: Limited Mouth Opening  Dental  (+) Teeth Intact, Dental Advisory Given and Poor Dentition   Pulmonary  breath sounds clear to auscultation        Cardiovascular hypertension, Pt. on medications and Pt. on home beta blockers Rhythm:Regular Rate:Normal     Neuro/Psych    GI/Hepatic   Endo/Other    Renal/GU      Musculoskeletal   Abdominal   Peds  Hematology   Anesthesia Other Findings   Reproductive/Obstetrics                           Anesthesia Physical Anesthesia Plan  ASA: II and emergent  Anesthesia Plan: General   Post-op Pain Management:    Induction: Intravenous  Airway Management Planned: Oral ETT  Additional Equipment:   Intra-op Plan:   Post-operative Plan: Extubation in OR  Informed Consent: I have reviewed the patients History and Physical, chart, labs and discussed the procedure including the risks, benefits and alternatives for the proposed anesthesia with the patient or authorized representative who has indicated his/her understanding and acceptance.   Dental advisory given  Plan Discussed with: CRNA, Anesthesiologist and Surgeon  Anesthesia Plan Comments:       Anesthesia Quick Evaluation

## 2013-04-21 NOTE — Transfer of Care (Signed)
Immediate Anesthesia Transfer of Care Note  Patient: Dylan Gonzalez  Procedure(s) Performed: Procedure(s): EXTRACTION MOLARS (Right)  Patient Location: PACU  Anesthesia Type:General  Level of Consciousness: oriented, sedated, patient cooperative and responds to stimulation  Airway & Oxygen Therapy: Patient Spontanous Breathing and Patient connected to nasal cannula oxygen  Post-op Assessment: Report given to PACU RN, Post -op Vital signs reviewed and stable and Patient moving all extremities X 4  Post vital signs: Reviewed and stable  Complications: No apparent anesthesia complications

## 2013-04-22 LAB — GRAM STAIN

## 2013-04-22 LAB — CBC
HCT: 33.7 % — ABNORMAL LOW (ref 39.0–52.0)
Hemoglobin: 11 g/dL — ABNORMAL LOW (ref 13.0–17.0)
MCHC: 32.6 g/dL (ref 30.0–36.0)
WBC: 9.7 10*3/uL (ref 4.0–10.5)

## 2013-04-22 MED ORDER — ENSURE PUDDING PO PUDG
1.0000 | Freq: Three times a day (TID) | ORAL | Status: DC
  Administered 2013-04-22 – 2013-04-24 (×6): 1 via ORAL

## 2013-04-22 MED ORDER — METOPROLOL SUCCINATE ER 100 MG PO TB24
200.0000 mg | ORAL_TABLET | Freq: Every day | ORAL | Status: DC
  Administered 2013-04-22 – 2013-04-24 (×3): 200 mg via ORAL
  Filled 2013-04-22 (×3): qty 2

## 2013-04-22 MED ORDER — FUROSEMIDE 40 MG PO TABS
40.0000 mg | ORAL_TABLET | Freq: Every day | ORAL | Status: DC
  Administered 2013-04-22 – 2013-04-24 (×3): 40 mg via ORAL
  Filled 2013-04-22 (×3): qty 1

## 2013-04-22 MED ORDER — LOSARTAN POTASSIUM 50 MG PO TABS
100.0000 mg | ORAL_TABLET | Freq: Every day | ORAL | Status: DC
  Administered 2013-04-22 – 2013-04-24 (×3): 100 mg via ORAL
  Filled 2013-04-22 (×3): qty 2
  Filled 2013-04-22 (×2): qty 1

## 2013-04-22 NOTE — Progress Notes (Signed)
Utilization review completed.  

## 2013-04-22 NOTE — Progress Notes (Signed)
Dylan Gonzalez PROGRESS NOTE:   SUBJECTIVE: Sore, painful.    OBJECTIVE:  Vitals: Blood pressure 166/91, pulse 56, temperature 97.8 F (36.6 C), temperature source Axillary, resp. rate 17, SpO2 100.00%. Lab results: Results for orders placed during the hospital encounter of 04/21/13 (from the past 24 hour(s))  BASIC METABOLIC PANEL     Status: Abnormal   Collection Time    04/21/13  3:55 PM      Result Value Range   Sodium 143  135 - 145 mEq/L   Potassium 3.4 (*) 3.5 - 5.1 mEq/L   Chloride 103  96 - 112 mEq/L   CO2 31  19 - 32 mEq/L   Glucose, Bld 83  70 - 99 mg/dL   BUN 8  6 - 23 mg/dL   Creatinine, Ser 4.54  0.50 - 1.35 mg/dL   Calcium 9.2  8.4 - 09.8 mg/dL   GFR calc non Af Amer 73 (*) >90 mL/min   GFR calc Af Amer 85 (*) >90 mL/min  CULTURE, ROUTINE-ABSCESS     Status: None   Collection Time    04/21/13 11:36 PM      Result Value Range   Specimen Description ABSCESS     Special Requests LEFT PERIGOID SPACE PATIENT ON FOLLOWING UNASYN     Gram Stain PENDING     Culture       Value: NO GROWTH     Performed at [facility]   Report Status PENDING    GRAM STAIN     Status: None   Collection Time    04/21/13 11:36 PM      Result Value Range   Specimen Description ABSCESS     Special Requests LEFT PERIGOID SPACE     Gram Stain       Value: MODERATE GRAM POSITIVE COCCI IN PAIRS     FEW GRAM POSITIVE COCCI IN CHAINS     FEW WBC PRESENT,BOTH PMN AND MONONUCLEAR     Results Called toRosalie Gums [id number] [place]   Report Status 04/22/2013 FINAL    CBC     Status: Abnormal   Collection Time    04/22/13  6:38 AM      Result Value Range   WBC 9.7  4.0 - 10.5 K/uL   RBC 3.59 (*) 4.22 - 5.81 MIL/uL   Hemoglobin 11.0 (*) 13.0 - 17.0 g/dL   HCT 82.9 (*) 56.2 - 13.0 %   MCV 93.9  78.0 - 100.0 fL   MCH 30.6  26.0 - 34.0 pg   MCHC 32.6  30.0 - 36.0 g/dL   RDW 86.5  78.4 - 69.6 %   Platelets 250  150 - 400 K/uL   Radiology Results: Ct Soft Tissue Neck W  Contrast  04/21/2013   *RADIOLOGY REPORT*  Clinical Data: 47 year old male with recent odontogenic infection/abscess status post IV antibiotics.  Increased left face swelling.  CT NECK WITH CONTRAST  Technique:  Multidetector CT imaging of the neck was performed with intravenous contrast.  Contrast: 75mL OMNIPAQUE IOHEXOL 300 MG/ML  SOLN  Comparison: Face CT with contrast 04/16/2013.  Findings: Negative lung apices.  Negative superior mediastinum. Negative thyroid.  Negative thoracic inlet aside from a 6 mm dense nodular soft tissue calcifications, etiology unclear but significance doubtful - these may represent chronically calcified/postinflammatory nodes.  No level IV lymphadenopathy.  Major vascular structures in the neck including the bilateral internal jugular veins remain patent.  Major vascular structures at the skull base  remain patent.  Negative visualized brain parenchyma.  Small retropharyngeal effusion is increased (series 4 image 61). Continued low density and soft tissue swelling along the left oral pharyngeal wall (series 4 image 60).  No significant mass effect on the airway at the level.  Associated moderate effacement of the left vallecula and piriform sinus.  Stable epiglottis, but the left aryepiglottic fold does appear more thickened.  The airway is effaced at the level of the false cords, but the glottis appears closed.  Worsening expansion and heterogeneous enhancement of the left pterygoid musculature posterior to the left retromolar trigone (series 4 image 46).  This probable intramuscular developing abscess encompasses 26-28 mm diameter.  Extensive lucency re- identified surrounding the left mandible wisdom tooth with large dental caries.  Abnormal expansion and cortical breakthrough of the medial mandible (see series 5 images 54 and 55).  Stable dentition elsewhere.  No pathologic fracture identified.  Secondary involvement of the left submandibular gland, but glandular enhancement  remained fairly normal.  A small level I and up to 12 mm left level II lymph nodes are stable.  The superficial aspect of the left masticator spaces relatively spared. Inflammation tracks to the left parapharyngeal space.  No definite tonsillar space abscess, postinflammatory tonsillar calcifications re-identified.  The sublingual space is spared. Negative right submandibular and bilateral parotid glands.  Stable orbits, left orbital prosthesis.  Stable paranasal sinuses with a mix of low density and high density sinus contents. Tympanic cavities and mastoids remain clear.  IMPRESSION: 1.  Progressed trans-spatial infection of the left face and neck, epicenter at the left pterygoid muscles where a developing 28 mm diameter intramuscular abscess is suspected (see series 4 image 46).  Increased inflammation and swelling of the left lateral pharyngeal wall, left hypopharynx and left aryepiglottic fold. Airway effacement at the level of the false cords. 2.  Infection origin appears to be the left mandible wisdom tooth, with changes compatible with focal mandibular osteomyelitis. 3.  Trace retropharyngeal effusion.  Negative thoracic inlet and superior mediastinum.  Study discussed by telephone with Dr. Rolland Porter on 04/21/2013 at 1825 hours.   Original Report Authenticated By: Erskine Speed, M.D.   General appearance: alert, cooperative and no distress Throat: Trismus to 10mm. Drain intact left manible. Hemostatic.  Neck: no adenopathy, supple, symmetrical, trachea midline and Soft left submandibular region. edema decreasing Resp: clear to auscultation bilaterally Cardio: regular rate and rhythm, S1, S2 normal, no murmur, click, rub or gallop  ASSESSMENT: Stable s/p extractions, I and D.   PLAN: Continue IV antibiotics. Encourage PO intake, ambulation, oral hygiene. Consider D/C am if improving.   [name] 04/22/2013

## 2013-04-23 ENCOUNTER — Encounter (HOSPITAL_COMMUNITY): Payer: Self-pay | Admitting: Oral Surgery

## 2013-04-23 LAB — CBC
Hemoglobin: 12.1 g/dL — ABNORMAL LOW (ref 13.0–17.0)
MCH: 30.8 pg (ref 26.0–34.0)
MCV: 93.4 fL (ref 78.0–100.0)
RBC: 3.93 MIL/uL — ABNORMAL LOW (ref 4.22–5.81)

## 2013-04-23 NOTE — Progress Notes (Signed)
Dylan Gonzalez PROGRESS NOTE:   SUBJECTIVE: Pain less. Swelling slightly better.   OBJECTIVE:  Vitals: Blood pressure 174/98, pulse 71, temperature 98 F (36.7 C), temperature source Axillary, resp. rate 20, height 5\' 10"  (1.778 m), weight 85.73 kg (189 lb), SpO2 95.00%. Lab results: Results for orders placed during the hospital encounter of 04/21/13 (from the past 24 hour(s))  CBC     Status: Abnormal   Collection Time    04/23/13  5:50 AM      Result Value Range   WBC 11.5 (*) 4.0 - 10.5 K/uL   RBC 3.93 (*) 4.22 - 5.81 MIL/uL   Hemoglobin 12.1 (*) 13.0 - 17.0 g/dL   HCT 56.2 (*) 13.0 - 86.5 %   MCV 93.4  78.0 - 100.0 fL   MCH 30.8  26.0 - 34.0 pg   MCHC 33.0  30.0 - 36.0 g/dL   RDW 78.4  69.6 - 29.5 %   Platelets 287  150 - 400 K/uL   Radiology Results: Ct Soft Tissue Neck W Contrast  04/21/2013   *RADIOLOGY REPORT*  Clinical Data: 47 year old male with recent odontogenic infection/abscess status post IV antibiotics.  Increased left face swelling.  CT NECK WITH CONTRAST  Technique:  Multidetector CT imaging of the neck was performed with intravenous contrast.  Contrast: 75mL OMNIPAQUE IOHEXOL 300 MG/ML  SOLN  Comparison: Face CT with contrast 04/16/2013.  Findings: Negative lung apices.  Negative superior mediastinum. Negative thyroid.  Negative thoracic inlet aside from a 6 mm dense nodular soft tissue calcifications, etiology unclear but significance doubtful - these may represent chronically calcified/postinflammatory nodes.  No level IV lymphadenopathy.  Major vascular structures in the neck including the bilateral internal jugular veins remain patent.  Major vascular structures at the skull base remain patent.  Negative visualized brain parenchyma.  Small retropharyngeal effusion is increased (series 4 image 61). Continued low density and soft tissue swelling along the left oral pharyngeal wall (series 4 image 60).  No significant mass effect on the airway at the level.  Associated  moderate effacement of the left vallecula and piriform sinus.  Stable epiglottis, but the left aryepiglottic fold does appear more thickened.  The airway is effaced at the level of the false cords, but the glottis appears closed.  Worsening expansion and heterogeneous enhancement of the left pterygoid musculature posterior to the left retromolar trigone (series 4 image 46).  This probable intramuscular developing abscess encompasses 26-28 mm diameter.  Extensive lucency re- identified surrounding the left mandible wisdom tooth with large dental caries.  Abnormal expansion and cortical breakthrough of the medial mandible (see series 5 images 54 and 55).  Stable dentition elsewhere.  No pathologic fracture identified.  Secondary involvement of the left submandibular gland, but glandular enhancement remained fairly normal.  A small level I and up to 12 mm left level II lymph nodes are stable.  The superficial aspect of the left masticator spaces relatively spared. Inflammation tracks to the left parapharyngeal space.  No definite tonsillar space abscess, postinflammatory tonsillar calcifications re-identified.  The sublingual space is spared. Negative right submandibular and bilateral parotid glands.  Stable orbits, left orbital prosthesis.  Stable paranasal sinuses with a mix of low density and high density sinus contents. Tympanic cavities and mastoids remain clear.  IMPRESSION: 1.  Progressed trans-spatial infection of the left face and neck, epicenter at the left pterygoid muscles where a developing 28 mm diameter intramuscular abscess is suspected (see series 4 image 46).  Increased inflammation and  swelling of the left lateral pharyngeal wall, left hypopharynx and left aryepiglottic fold. Airway effacement at the level of the false cords. 2.  Infection origin appears to be the left mandible wisdom tooth, with changes compatible with focal mandibular osteomyelitis. 3.  Trace retropharyngeal effusion.  Negative  thoracic inlet and superior mediastinum.  Study discussed by telephone with Dr. Rolland Porter on 04/21/2013 at 1825 hours.   Original Report Authenticated By: Erskine Speed, M.D.   General appearance: alert, cooperative and no distress Head: Normocephalic, without obvious abnormality, atraumatic Throat: Maximum opening approx 12mm. Pharynx clear. Drain intact.  Neck: no adenopathy, supple, symmetrical, trachea midline, thyroid not enlarged, symmetric, no tenderness/mass/nodules and Mild soft edema left manible Resp: clear to auscultation bilaterally Cardio: regular rate and rhythm, S1, S2 normal, no murmur, click, rub or gallop  ASSESSMENT: Improving after I&D. Concerned regarding increase in white count.  PLAN: Continue IV antibiotics. Consider repeat CT if not improved tomorrow.   [name] 04/23/2013

## 2013-04-24 LAB — CBC
HCT: 35.8 % — ABNORMAL LOW (ref 39.0–52.0)
Hemoglobin: 12 g/dL — ABNORMAL LOW (ref 13.0–17.0)
MCH: 31.3 pg (ref 26.0–34.0)
MCHC: 33.5 g/dL (ref 30.0–36.0)
MCV: 93.2 fL (ref 78.0–100.0)
Platelets: 295 10*3/uL (ref 150–400)
RBC: 3.84 MIL/uL — ABNORMAL LOW (ref 4.22–5.81)
RDW: 12.2 % (ref 11.5–15.5)
WBC: 8.5 10*3/uL (ref 4.0–10.5)

## 2013-04-24 MED ORDER — AMOXICILLIN-POT CLAVULANATE 875-125 MG PO TABS: 1.0000 | ORAL_TABLET | Freq: Two times a day (BID) | ORAL | Status: DC

## 2013-04-24 MED ORDER — OXYCODONE-ACETAMINOPHEN 5-325 MG PO TABS: 1.0000 | ORAL_TABLET | ORAL | Status: DC | PRN

## 2013-04-24 NOTE — Progress Notes (Signed)
Patient alert and oriented x4.  Pt given discharge instructions and patient denies any questions or concerns.  Pt given prescriptions for medications.  IV access removed and cannula intact.  No drainage, bruising noted.  Pt tolerated removal well.  Pt to be discharged to home via private vehicle to home.

## 2013-04-24 NOTE — Discharge Summary (Signed)
Physician Discharge Summary   Patient ID: Dylan Gonzalez [id number] 47 y.o. 1965-11-23  Admit date: 04/21/2013  Discharge date and time: No discharge date for patient encounter.   Admitting Physician: Dylan Gonzalez, DDS   Discharge Physician: Dylan Gonzalez, DMD  Admission Diagnoses: Facial abscess [682.0]  Discharge Diagnoses: Facial abscess, mandibular cyst, impacted third molar, dental abscess  Admission Condition: fair   Discharged Condition: good  Indication for Admission: left mandible swelling with trismus requiring surgical intervention  Hospital Course:ntervention Patient admitted post-operatively after incision and drainage left mandible, removal abscessed impacted tooth, removal mandible cyst. Patient received IV antibiotics, IV pain medicine.  Consults: None  Significant Diagnostic Studies: labs: hematology, chemistry, microbiology: wound culture: final growth pending and radiology: CT scan: maxillofacial  Treatments: IV hydration, antibiotics: Unasyn, analgesia: Dilaudid and surgery: Incision and drainage left mandible.  Discharge Exam: HEENT, Oral trismus to 20mm, pharynx clear, drain intact. left mandible Non-fluctuant   Disposition: 06-Home-Health Care Svc  Patient Instructions:    Medication List    STOP taking these medications       clindamycin 150 MG capsule  Commonly known as:  CLEOCIN      TAKE these medications       amoxicillin-clavulanate 875-125 MG per tablet  Commonly known as:  AUGMENTIN  Take 1 tablet by mouth 2 (two) times daily.     diphenhydrAMINE 25 MG tablet  Commonly known as:  BENADRYL  Take 25 mg by mouth at bedtime as needed for sleep.     furosemide 40 MG tablet  Commonly known as:  LASIX  Take 40 mg by mouth every morning.     ibuprofen 800 MG tablet  Commonly known as:  ADVIL,MOTRIN  Take 800 mg by mouth every 8 (eight) hours as needed for pain.     losartan 100 MG tablet  Commonly known as:  COZAAR  Take 100  mg by mouth every morning.      ASK your doctor about these medications       metoprolol 200 MG 24 hr tablet  Commonly known as:  TOPROL-XL  Take 200 mg by mouth daily.     oxyCODONE-acetaminophen 5-325 MG per tablet  Commonly known as:  PERCOCET/ROXICET  Take 1-2 tablets by mouth every 6 (six) hours as needed for pain.  Ask about: Which instructions should I use?     oxyCODONE-acetaminophen 5-325 MG per tablet  Commonly known as:  PERCOCET  Take 1-2 tablets by mouth every 4 (four) hours as needed for pain.  Ask about: Which instructions should I use?       Activity: activity as tolerated Diet: soft diet, advance as tolerated Wound Care: warm salt water rinses 4x per day  Follow-up with Dr. Barbette Merino today for drain removal.  Signed: [name] 04/24/2013 7:35 AM

## 2013-04-25 LAB — CULTURE, ROUTINE-ABSCESS

## 2013-04-29 ENCOUNTER — Other Ambulatory Visit: Payer: Self-pay | Admitting: Family Medicine

## 2013-07-15 ENCOUNTER — Other Ambulatory Visit: Payer: Self-pay | Admitting: Family Medicine

## 2013-08-15 ENCOUNTER — Ambulatory Visit (INDEPENDENT_AMBULATORY_CARE_PROVIDER_SITE_OTHER): Payer: BC Managed Care – PPO | Admitting: Family Medicine

## 2013-08-15 VITALS — BP 142/92 | HR 72 | Temp 97.8°F | Resp 16 | Ht 70.5 in | Wt 192.0 lb

## 2013-08-15 DIAGNOSIS — I10 Essential (primary) hypertension: Secondary | ICD-10-CM

## 2013-08-15 MED ORDER — CLONIDINE HCL 0.1 MG PO TABS: 0.1000 mg | ORAL_TABLET | Freq: Three times a day (TID) | ORAL | Status: DC

## 2013-08-15 MED ORDER — LOSARTAN POTASSIUM 100 MG PO TABS: 100.0000 mg | ORAL_TABLET | Freq: Every morning | ORAL | Status: DC

## 2013-08-15 MED ORDER — FUROSEMIDE 40 MG PO TABS: 40.0000 mg | ORAL_TABLET | Freq: Every morning | ORAL | Status: DC

## 2013-08-15 NOTE — Progress Notes (Signed)
° °  Subjective:  This chart was scribed for Dylan Sidle, MD by Carl Best, Medical Scribe. This patient was seen in Room 8 and the patient's care was started at 7:09 PM.  Patient ID: Dylan Gonzalez, male    DOB: 06/12/1966, 47 y.o.   MRN: [id number]  HPI HPI Comments: Dylan Gonzalez is a 47 y.o. male with a history of Hypertension who presents to the [facility] needing a refill for his blood pressure medication.  The patient states that his swelling in his legs is gone and he feels well otherwise.  The patient states that he had to be hospitalized in August due to an infected abscess which caused him to lose some teeth and start antibiotics.  He denies having any other new medical problems.  He states that he is still taking Lasix.  The patient states that he does a lot of walking.  The patient works at [employer] on [address].  The patient states that he is not working on weekends.  He states that his wife is disabled but is very independent.  The patient has 2 daughters, a son, and four grandchildren.  He states they are doing well.    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  He states that his left eye drains frequently in the winter months.   Past Medical History  Diagnosis Date   Hypertension    Past Surgical History  Procedure Laterality Date   Hernia repair     Tooth extraction Right 04/21/2013    Procedure: EXTRACTION MOLARS;  Surgeon: [name], DDS;  Location: [facility] OR;  Service: Oral Surgery;  Laterality: Right;   Family History  Problem Relation Age of Onset   Hypertension Father    Depression Brother    History   Social History   Marital Status: Married    Spouse Name: [name]    Number of Children: [name]   Years of Education: [name]   Occupational History   Not on file.   Social History Main Topics   Smoking status: Never Smoker    Smokeless tobacco: Not on file    Alcohol Use: No   Drug Use: No   Sexual Activity: Not Currently   Other Topics Concern   Not on file   Social History Narrative   No narrative on file   No Known Allergies    Review of Systems  All other systems reviewed and are negative.     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 The recorded information has been reviewed and is accurate.  Hypertension - Plan: Ambulatory referral to Ophthalmology, furosemide (LASIX) 40 MG tablet, losartan (COZAAR) 100 MG tablet, cloNIDine (CATAPRES) 0.1 MG tablet  Signed, Dylan Sidle, MD

## 2013-09-15 ENCOUNTER — Encounter (HOSPITAL_COMMUNITY): Payer: Self-pay | Admitting: Emergency Medicine

## 2013-09-15 ENCOUNTER — Emergency Department (HOSPITAL_COMMUNITY): Payer: BC Managed Care – PPO

## 2013-09-15 ENCOUNTER — Emergency Department (HOSPITAL_COMMUNITY)
Admission: EM | Admit: 2013-09-15 | Discharge: 2013-09-15 | Disposition: A | Discharge status: Home / Self Care | Payer: BC Managed Care – PPO | Attending: Emergency Medicine | Admitting: Emergency Medicine

## 2013-09-15 DIAGNOSIS — I10 Essential (primary) hypertension: Secondary | ICD-10-CM

## 2013-09-15 DIAGNOSIS — M7989 Other specified soft tissue disorders: Secondary | ICD-10-CM

## 2013-09-15 DIAGNOSIS — Z79899 Other long term (current) drug therapy: Secondary | ICD-10-CM | POA: Insufficient documentation

## 2013-09-15 DIAGNOSIS — R079 Chest pain, unspecified: Secondary | ICD-10-CM | POA: Insufficient documentation

## 2013-09-15 DIAGNOSIS — I252 Old myocardial infarction: Secondary | ICD-10-CM | POA: Insufficient documentation

## 2013-09-15 LAB — CBC
HEMATOCRIT: 38.3 % — AB (ref 39.0–52.0)
Hemoglobin: 12.6 g/dL — ABNORMAL LOW (ref 13.0–17.0)
MCH: 31 pg (ref 26.0–34.0)
MCHC: 32.9 g/dL (ref 30.0–36.0)
MCV: 94.3 fL (ref 78.0–100.0)
Platelets: 234 10*3/uL (ref 150–400)
RBC: 4.06 MIL/uL — AB (ref 4.22–5.81)
RDW: 12.5 % (ref 11.5–15.5)
WBC: 7.3 10*3/uL (ref 4.0–10.5)

## 2013-09-15 LAB — PRO B NATRIURETIC PEPTIDE: Pro B Natriuretic peptide (BNP): 132.3 pg/mL — ABNORMAL HIGH (ref 0–125)

## 2013-09-15 LAB — BASIC METABOLIC PANEL
BUN: 13 mg/dL (ref 6–23)
CHLORIDE: 104 meq/L (ref 96–112)
CO2: 28 meq/L (ref 19–32)
Calcium: 8.7 mg/dL (ref 8.4–10.5)
Creatinine, Ser: 1.06 mg/dL (ref 0.50–1.35)
GFR calc Af Amer: >90 mL/min (ref >90)
GFR, EST NON AFRICAN AMERICAN: 82 mL/min — AB (ref >90)
Glucose, Bld: 113 mg/dL — ABNORMAL HIGH (ref 70–99)
POTASSIUM: 3.5 meq/L — AB (ref 3.7–5.3)
SODIUM: 145 meq/L (ref 137–147)

## 2013-09-15 LAB — POCT I-STAT TROPONIN I: TROPONIN I, POC: 0 ng/mL (ref 0.00–0.08)

## 2013-09-15 MED ORDER — ACETAMINOPHEN 325 MG PO TABS
650.0000 mg | ORAL_TABLET | Freq: Once | ORAL | Status: AC
  Administered 2013-09-15: 650 mg via ORAL
  Filled 2013-09-15: qty 2

## 2013-09-15 MED ORDER — CLONIDINE HCL 0.2 MG PO TABS
0.2000 mg | ORAL_TABLET | Freq: Once | ORAL | Status: AC
  Administered 2013-09-15: 0.2 mg via ORAL
  Filled 2013-09-15: qty 1

## 2013-09-15 NOTE — ED Provider Notes (Signed)
Patient signed out to me at shift change awaiting ultrasound to rule out blood clot. The study return and was negative. He presented here with chest pain and elevated blood pressure, however had a negative cardiac workup. He appears well and is requesting to go home. His clonidine will be increased to 0.2 mg 3 times a day. He actually has a doctor's appointment tomorrow and he can followup with his primary Dr. at that time  Veryl Speak, MD 09/15/13 1004

## 2013-09-15 NOTE — Progress Notes (Signed)
VASCULAR LAB PRELIMINARY  PRELIMINARY  PRELIMINARY  PRELIMINARY  Right lower extremity venous Doppler completed.    Preliminary report:  There is no DVT or SVT noted in the right lower extremity.  Alnisa Hasley, RVT 09/15/2013, 8:45 AM

## 2013-09-15 NOTE — ED Notes (Signed)
Pt transported to vascular for doppler study.

## 2013-09-15 NOTE — Discharge Instructions (Signed)
Increase your clonidine to 0.2 mg 3 times daily.  Keep your followup appointment with your primary Dr. tomorrow as scheduled.   Arterial Hypertension Arterial hypertension (high blood pressure) is a condition of elevated pressure in your blood vessels. Hypertension over a long period of time is a risk factor for strokes, heart attacks, and heart failure. It is also the leading cause of kidney (renal) failure.  CAUSES   In Adults -- Over 90% of all hypertension has no known cause. This is called essential or primary hypertension. In the other 10% of people with hypertension, the increase in blood pressure is caused by another disorder. This is called secondary hypertension. Important causes of secondary hypertension are:  Heavy alcohol use.  Obstructive sleep apnea.  Hyperaldosterosim (Conn's syndrome).  Steroid use.  Chronic kidney failure.  Hyperparathyroidism.  Medications.  Renal artery stenosis.  Pheochromocytoma.  Cushing's disease.  Coarctation of the aorta.  Scleroderma renal crisis.  Licorice (in excessive amounts).  Drugs (cocaine, methamphetamine). Your caregiver can explain any items above that apply to you.  In Children -- Secondary hypertension is more common and should always be considered.  Pregnancy -- Few women of childbearing age have high blood pressure. However, up to 10% of them develop hypertension of pregnancy. Generally, this will not harm the woman. It may be a sign of 3 complications of pregnancy: preeclampsia, HELLP syndrome, and eclampsia. Follow up and control with medication is necessary. SYMPTOMS   This condition normally does not produce any noticeable symptoms. It is usually found during a routine exam.  Malignant hypertension is a late problem of high blood pressure. It may have the following symptoms:  Headaches.  Blurred vision.  End-organ damage (this means your kidneys, heart, lungs, and other organs are being  damaged).  Stressful situations can increase the blood pressure. If a person with normal blood pressure has their blood pressure go up while being seen by their caregiver, this is often termed "white coat hypertension." Its importance is not known. It may be related with eventually developing hypertension or complications of hypertension.  Hypertension is often confused with mental tension, stress, and anxiety. DIAGNOSIS  The diagnosis is made by 3 separate blood pressure measurements. They are taken at least 1 week apart from each other. If there is organ damage from hypertension, the diagnosis may be made without repeat measurements. Hypertension is usually identified by having blood pressure readings:  Above 140/90 mmHg measured in both arms, at 3 separate times, over a couple weeks.  Over 130/80 mmHg should be considered a risk factor and may require treatment in patients with diabetes. Blood pressure readings over 120/80 mmHg are called "pre-hypertension" even in non-diabetic patients. To get a true blood pressure measurement, use the following guidelines. Be aware of the factors that can alter blood pressure readings.  Take measurements at least 1 hour after caffeine.  Take measurements 30 minutes after smoking and without any stress. This is another reason to quit smoking  it raises your blood pressure.  Use a proper cuff size. Ask your caregiver if you are not sure about your cuff size.  Most home blood pressure cuffs are automatic. They will measure systolic and diastolic pressures. The systolic pressure is the pressure reading at the start of sounds. Diastolic pressure is the pressure at which the sounds disappear. If you are elderly, measure pressures in multiple postures. Try sitting, lying or standing.  Sit at rest for a minimum of 5 minutes before taking measurements.  You  should not be on any medications like decongestants. These are found in many cold medications.  Record  your blood pressure readings and review them with your caregiver. If you have hypertension:  Your caregiver may do tests to be sure you do not have secondary hypertension (see "causes" above).  Your caregiver may also look for signs of metabolic syndrome. This is also called Syndrome X or Insulin Resistance Syndrome. You may have this syndrome if you have type 2 diabetes, abdominal obesity, and abnormal blood lipids in addition to hypertension.  Your caregiver will take your medical and family history and perform a physical exam.  Diagnostic tests may include blood tests (for glucose, cholesterol, potassium, and kidney function), a urinalysis, or an EKG. Other tests may also be necessary depending on your condition. PREVENTION  There are important lifestyle issues that you can adopt to reduce your chance of developing hypertension:  Maintain a normal weight.  Limit the amount of salt (sodium) in your diet.  Exercise often.  Limit alcohol intake.  Get enough potassium in your diet. Discuss specific advice with your caregiver.  Follow a DASH diet (dietary approaches to stop hypertension). This diet is rich in fruits, vegetables, and low-fat dairy products, and avoids certain fats. PROGNOSIS  Essential hypertension cannot be cured. Lifestyle changes and medical treatment can lower blood pressure and reduce complications. The prognosis of secondary hypertension depends on the underlying cause. Many people whose hypertension is controlled with medicine or lifestyle changes can live a normal, healthy life.  RISKS AND COMPLICATIONS  While high blood pressure alone is not an illness, it often requires treatment due to its short- and long-term effects on many organs. Hypertension increases your risk for:  CVAs or strokes (cerebrovascular accident).  Heart failure due to chronically high blood pressure (hypertensive cardiomyopathy).  Heart attack (myocardial infarction).  Damage to the  retina (hypertensive retinopathy).  Kidney failure (hypertensive nephropathy). Your caregiver can explain list items above that apply to you. Treatment of hypertension can significantly reduce the risk of complications. TREATMENT   For overweight patients, weight loss and regular exercise are recommended. Physical fitness lowers blood pressure.  Mild hypertension is usually treated with diet and exercise. A diet rich in fruits and vegetables, fat-free dairy products, and foods low in fat and salt (sodium) can help lower blood pressure. Decreasing salt intake decreases blood pressure in a 1/3 of people.  Stop smoking if you are a smoker. The steps above are highly effective in reducing blood pressure. While these actions are easy to suggest, they are difficult to achieve. Most patients with moderate or severe hypertension end up requiring medications to bring their blood pressure down to a normal level. There are several classes of medications for treatment. Blood pressure pills (antihypertensives) will lower blood pressure by their different actions. Lowering the blood pressure by 10 mmHg may decrease the risk of complications by as much as 25%. The goal of treatment is effective blood pressure control. This will reduce your risk for complications. Your caregiver will help you determine the best treatment for you according to your lifestyle. What is excellent treatment for one person, may not be for you. HOME CARE INSTRUCTIONS   Do not smoke.  Follow the lifestyle changes outlined in the "Prevention" section.  If you are on medications, follow the directions carefully. Blood pressure medications must be taken as prescribed. Skipping doses reduces their benefit. It also puts you at risk for problems.  Follow up with your caregiver, as directed.  If you are asked to monitor your blood pressure at home, follow the guidelines in the "Diagnosis" section above. SEEK MEDICAL CARE IF:   You think  you are having medication side effects.  You have recurrent headaches or lightheadedness.  You have swelling in your ankles.  You have trouble with your vision. SEEK IMMEDIATE MEDICAL CARE IF:   You have sudden onset of chest pain or pressure, difficulty breathing, or other symptoms of a heart attack.  You have a severe headache.  You have symptoms of a stroke (such as sudden weakness, difficulty speaking, difficulty walking). MAKE SURE YOU:   Understand these instructions.  Will watch your condition.  Will get help right away if you are not doing well or get worse. Document Released: 08/23/2005 Document Revised: 11/15/2011 Document Reviewed: 03/23/2007 [facility] Patient Information 2014 [place].

## 2013-09-15 NOTE — ED Notes (Signed)
Pt resting quietly at the time. Updated on plan of care for doppler study. Vital signs stable. 5/10 pain to R lower leg. Friend at bedside. No signs of acute distress noted.

## 2013-09-15 NOTE — ED Notes (Addendum)
Pt arrives via EMS c/o int chest pain midsternum pressure. Upon EMS arrival chest pain was gone. States that he was SOB and diaphoretic during chest pain. 184/126 HR 70 on EMS arrival. Hx MI, HTN, CHF. 12 lead NSR. Started lasix 5 days ago. Remained pain free during transport. Pt also states that he has had a chest cold for the past 7 days.

## 2013-09-15 NOTE — ED Notes (Signed)
Pt returned to exam room. 2/10 R lower leg pain. Vital signs stable. Family at bedside, no signs of distress noted.

## 2013-09-15 NOTE — ED Provider Notes (Signed)
CSN: [id number]     Arrival date & time 09/15/13  0227 History   First MD Initiated Contact with Patient 09/15/13 0401     Chief Complaint  Patient presents with  . Chest Pain   (Consider location/radiation/quality/duration/timing/severity/associated sxs/prior Treatment) Patient is a 48 y.o. male presenting with chest pain. The history is provided by the patient and the spouse.  Chest Pain Dylan Gonzalez is a 48 y.o. male who presents for evaluation of cough, shortness of breath, chest pain, and right leg swelling. He onset of these symptoms several days ago. The chest pain is sporadic. His doctor changed his blood pressure medications 3 weeks ago. He has been urinating a lot since he was started on Lasix. His blood pressure was checked at home, and it was high. He denies fever or weakness, dizziness, abdominal pain, or back pain. He is using his regular medications without relief. There are no other known modifying factors.   Past Medical History  Diagnosis Date  . Hypertension   . MI (myocardial infarction)    Past Surgical History  Procedure Laterality Date  . Hernia repair    . Tooth extraction Right 04/21/2013    Procedure: EXTRACTION MOLARS;  Surgeon: [name], DDS;  Location: [facility];  Service: Oral Surgery;  Laterality: Right;  . Eye removed     Family History  Problem Relation Age of Onset  . Hypertension Father   . Depression Brother    History  Substance Use Topics  . Smoking status: Never Smoker   . Smokeless tobacco: Not on file  . Alcohol Use: No    Review of Systems  Cardiovascular: Positive for chest pain.  All other systems reviewed and are negative.    Allergies  Review of patient's allergies indicates no known allergies.  Home Medications   Current Outpatient Rx  Name  Route  Sig  Dispense  Refill  . cloNIDine (CATAPRES) 0.1 MG tablet   Oral   Take 1 tablet (0.1 mg total) by mouth 3 (three) times daily.   90 tablet   3   . furosemide  (LASIX) 40 MG tablet   Oral   Take 1 tablet (40 mg total) by mouth every morning.   30 tablet   6    BP 151/101  Pulse 68  Temp(Src) 97.7 F (36.5 C) (Oral)  Resp 15  Ht 5\' 11"  (1.803 m)  Wt 192 lb (87.091 kg)  BMI 26.79 kg/m2  SpO2 99% Physical Exam  Nursing note and vitals reviewed. Constitutional: He is oriented to person, place, and time. He appears well-developed and well-nourished.  HENT:  Head: Normocephalic and atraumatic.  Right Ear: External ear normal.  Left Ear: External ear normal.  Eyes: Conjunctivae and EOM are normal. Pupils are equal, round, and reactive to light.  Neck: Normal range of motion and phonation normal. Neck supple.  Cardiovascular: Normal rate, regular rhythm, normal heart sounds and intact distal pulses.   Pulmonary/Chest: Effort normal and breath sounds normal. No respiratory distress. He has no wheezes. He has no rales. He exhibits no bony tenderness.  Abdominal: Soft. Normal appearance. There is no tenderness.  Musculoskeletal: Normal range of motion.  Mild right lower leg swelling greater than left.  Neurological: He is alert and oriented to person, place, and time. No cranial nerve deficit or sensory deficit. He exhibits normal muscle tone. Coordination normal.  Skin: Skin is warm, dry and intact.  Psychiatric: He has a normal mood and affect. His behavior  is normal. Judgment and thought content normal.    ED Course  Procedures (including critical care time)  Medications  cloNIDine (CATAPRES) tablet 0.2 mg (0.2 mg Oral Given 09/15/13 0419)  acetaminophen (TYLENOL) tablet 650 mg (650 mg Oral Given 09/15/13 0419)    Patient Vitals for the past 24 hrs:  BP Temp Temp src Pulse Resp SpO2 Height Weight  09/15/13 0630 151/101 mmHg - - 68 15 99 % - -  09/15/13 0600 150/108 mmHg - - 64 17 100 % - -  09/15/13 0500 158/111 mmHg - - 65 18 100 % - -  09/15/13 0415 162/115 mmHg - - 66 20 99 % - -  09/15/13 0315 159/112 mmHg - - 69 15 99 % - -   09/15/13 0237 165/115 mmHg 97.7 F (36.5 C) Oral 67 19 99 % - -  09/15/13 0231 - - - - - - 5\' 11"  (1.803 m) 192 lb (87.091 kg)   He has mild hypertension. We'll order increased dose of clonidine.  7:15 AM Reevaluation with update and discussion. After initial assessment and treatment, an updated evaluation reveals he remains comfortable. Duplex imaging is pending. [place] Review Labs Reviewed  CBC - Abnormal; Notable for the following:    RBC 4.06 (*)    Hemoglobin 12.6 (*)    HCT 38.3 (*)    All other components within normal limits  BASIC METABOLIC PANEL - Abnormal; Notable for the following:    Potassium 3.5 (*)    Glucose, Bld 113 (*)    GFR calc non Af Amer 82 (*)    All other components within normal limits  PRO B NATRIURETIC PEPTIDE - Abnormal; Notable for the following:    Pro B Natriuretic peptide (BNP) 132.3 (*)    All other components within normal limits  POCT I-STAT TROPONIN I   Imaging Review Dg Chest Port 1 View  09/15/2013   CLINICAL DATA:  Chest pain.  EXAM: PORTABLE CHEST - 1 VIEW  COMPARISON:  PA and lateral chest 12/11/2011.  FINDINGS: Heart size and mediastinal contours are within normal limits. Both lungs are clear. Visualized skeletal structures are unremarkable.  IMPRESSION: No acute disease.   Electronically Signed   By: Inge Rise M.D.   On: 09/15/2013 03:04    EKG Interpretation    Date/Time:  Saturday September 15 2013 02:33:25 EST Ventricular Rate:  71 PR Interval:  137 QRS Duration: 91 QT Interval:  421 QTC Calculation: 457 R Axis:   49 Text Interpretation:  Sinus rhythm Consider left ventricular hypertrophy since last tracing no significant change Confirmed by Dylan Nawrot  MD, Keileigh Vahey ([id number]) on 09/15/2013 3:28:35 AM            MDM   1. Hypertension   2. Right leg swelling    Hypertension, with recent changes in his treatment. He is on very low-dose clonidine. No evidence for hypertensive urgency, ACS, or congestive  heart failure.   Nursing Notes Reviewed/ Care Coordinated Applicable Imaging Reviewed Interpretation of Laboratory Data incorporated into ED treatment  Plan; right leg. Doppler imaging prior to discharge. He will likely go home on clonidine 0.2 mg 3 times a day to improve his blood pressure control   Richarda Blade, MD 09/15/13 [phone]

## 2013-09-16 ENCOUNTER — Ambulatory Visit (INDEPENDENT_AMBULATORY_CARE_PROVIDER_SITE_OTHER): Payer: BC Managed Care – PPO | Admitting: Family Medicine

## 2013-09-16 VITALS — BP 140/90 | HR 75 | Temp 98.7°F | Resp 16 | Ht 72.0 in | Wt 195.0 lb

## 2013-09-16 DIAGNOSIS — I10 Essential (primary) hypertension: Secondary | ICD-10-CM

## 2013-09-16 DIAGNOSIS — R609 Edema, unspecified: Secondary | ICD-10-CM

## 2013-09-16 DIAGNOSIS — J309 Allergic rhinitis, unspecified: Secondary | ICD-10-CM

## 2013-09-16 DIAGNOSIS — R600 Localized edema: Secondary | ICD-10-CM

## 2013-09-16 MED ORDER — HYDROXYZINE PAMOATE 100 MG PO CAPS: ORAL_CAPSULE | ORAL | Status: DC

## 2013-09-16 MED ORDER — FUROSEMIDE 20 MG PO TABS: 20.0000 mg | ORAL_TABLET | Freq: Every day | ORAL | Status: DC

## 2013-09-16 MED ORDER — CLONIDINE HCL 0.2 MG PO TABS: 0.2000 mg | ORAL_TABLET | Freq: Two times a day (BID) | ORAL | Status: DC

## 2013-09-16 NOTE — Patient Instructions (Addendum)
Hypertension As your heart beats, it forces blood through your arteries. This force is your blood pressure. If the pressure is too high, it is called hypertension (HTN) or high blood pressure. HTN is dangerous because you may have it and not know it. High blood pressure may mean that your heart has to work harder to pump blood. Your arteries may be narrow or stiff. The extra work puts you at risk for heart disease, stroke, and other problems.  Blood pressure consists of two numbers, a higher number over a lower, 110/72, for example. It is stated as "110 over 72." The ideal is below 120 for the top number (systolic) and under 80 for the bottom (diastolic). Write down your blood pressure today. You should pay close attention to your blood pressure if you have certain conditions such as:  Heart failure.  Prior heart attack.  Diabetes  Chronic kidney disease.  Prior stroke.  Multiple risk factors for heart disease. To see if you have HTN, your blood pressure should be measured while you are seated with your arm held at the level of the heart. It should be measured at least twice. A one-time elevated blood pressure reading (especially in the Emergency Department) does not mean that you need treatment. There may be conditions in which the blood pressure is different between your right and left arms. It is important to see your caregiver soon for a recheck. Most people have essential hypertension which means that there is not a specific cause. This type of high blood pressure may be lowered by changing lifestyle factors such as:  Stress.  Smoking.  Lack of exercise.  Excessive weight.  Drug/tobacco/alcohol use.  Eating less salt. Most people do not have symptoms from high blood pressure until it has caused damage to the body. Effective treatment can often prevent, delay or reduce that damage. TREATMENT  When a cause has been identified, treatment for high blood pressure is directed at the  cause. There are a large number of medications to treat HTN. These fall into several categories, and your caregiver will help you select the medicines that are best for you. Medications may have side effects. You should review side effects with your caregiver. If your blood pressure stays high after you have made lifestyle changes or started on medicines,   Your medication(s) may need to be changed.  Other problems may need to be addressed.  Be certain you understand your prescriptions, and know how and when to take your medicine.  Be sure to follow up with your caregiver within the time frame advised (usually within two weeks) to have your blood pressure rechecked and to review your medications.  If you are taking more than one medicine to lower your blood pressure, make sure you know how and at what times they should be taken. Taking two medicines at the same time can result in blood pressure that is too low. SEEK IMMEDIATE MEDICAL CARE IF:  You develop a severe headache, blurred or changing vision, or confusion.  You have unusual weakness or numbness, or a faint feeling.  You have severe chest or abdominal pain, vomiting, or breathing problems. MAKE SURE YOU:   Understand these instructions.  Will watch your condition.  Will get help right away if you are not doing well or get worse. Document Released: 08/23/2005 Document Revised: 11/15/2011 Document Reviewed: 04/12/2008 [facility] Patient Information 2014 [place].  Results for orders placed during the hospital encounter of 09/15/13  CBC  Result Value Range   WBC 7.3  4.0 - 10.5 K/uL   RBC 4.06 (*) 4.22 - 5.81 MIL/uL   Hemoglobin 12.6 (*) 13.0 - 17.0 g/dL   HCT 38.3 (*) 39.0 - 52.0 %   MCV 94.3  78.0 - 100.0 fL   MCH 31.0  26.0 - 34.0 pg   MCHC 32.9  30.0 - 36.0 g/dL   RDW 12.5  11.5 - 15.5 %   Platelets 234  150 - 400 K/uL  BASIC METABOLIC PANEL      Result Value Range   Sodium 145  137 - 147 mEq/L    Potassium 3.5 (*) 3.7 - 5.3 mEq/L   Chloride 104  96 - 112 mEq/L   CO2 28  19 - 32 mEq/L   Glucose, Bld 113 (*) 70 - 99 mg/dL   BUN 13  6 - 23 mg/dL   Creatinine, Ser 1.06  0.50 - 1.35 mg/dL   Calcium 8.7  8.4 - 10.5 mg/dL   GFR calc non Af Amer 82 (*) >90 mL/min   GFR calc Af Amer >90  >90 mL/min  PRO B NATRIURETIC PEPTIDE      Result Value Range   Pro B Natriuretic peptide (BNP) 132.3 (*) 0 - 125 pg/mL  POCT I-STAT TROPONIN I      Result Value Range   Troponin i, poc 0.00  0.00 - 0.08 ng/mL   Comment 3

## 2013-09-16 NOTE — Progress Notes (Signed)
@[facility] @  Patient ID: Dylan Gonzalez MRN: [id number], DOB: 10-09-1965, 48 y.o. Date of Encounter: 09/16/2013, 1:52 PM This chart was scribed for Robyn Haber, MD by Vernell Barrier, Medical Scribe. This patient's care was started at 1:52 PM  Primary Physician: Robyn Haber, MD  Chief Complaint: hypertension  HPI: 48 y.o. year old male with history below here for follow up. States he had a severe HA over his right eye w/ associated right lower leg swelling 2 days ago in the PM that lasted for 1.5 hours. Says his wife checked his BP at home and it came back 194/105; she immediately called 911. Prescribed clonidine .2 mg which is working but states it makes him drowsy. He says he has not had a HA since that incident. States he still has some associated swelling in his right lower leg; not as severe as incident 2 days ago. Pt is working.   States allergies are acting up as well. Takes generic version of Benadryl. Pt does not have pets. The patient states that he does a lot of walking. The patient works at [employer] on [employer]. The patient states that he is not working on weekends. He states that his wife is disabled but is very independent. The patient has 2 daughters, a son, and four grandchildren. He states they are doing well.   [employer]: Active at work, forklift, filling product orders. Brother commit suicide Wife is disabled Blind left eye: retinoblastoma    Went to see eye doctor and states everything came back normal. Scheduled to return again in late June. Also has an appointment in March in [place].  Past Medical History  Diagnosis Date   Hypertension    MI (myocardial infarction)      Home Meds: Prior to Admission medications   Medication Sig Start Date End Date Taking? Authorizing Provider  cloNIDine (CATAPRES) 0.1 MG tablet Take 1 tablet (0.1 mg total) by mouth 3 (three) times daily. 08/15/13  Yes Robyn Haber, MD  furosemide (LASIX) 40 MG  tablet Take 1 tablet (40 mg total) by mouth every morning. 08/15/13  Yes Robyn Haber, MD    Allergies: No Known Allergies  History   Social History   Marital Status: Married    Spouse Name: [name]    Number of Children: [name]   Years of Education: [name]   Occupational History   Not on file.   Social History Main Topics   Smoking status: Never Smoker    Smokeless tobacco: Not on file   Alcohol Use: No   Drug Use: No   Sexual Activity: Not Currently   Other Topics Concern   Not on file   Social History Narrative   No narrative on file     Review of Systems: Constitutional: negative for chills, fever, night sweats, weight changes, or fatigue  HEENT: negative for vision changes, hearing loss, congestion, rhinorrhea, ST, epistaxis, or sinus pressure Cardiovascular: negative for chest pain or palpitations. Swelling in the lower extremities. Greater on the right than on the left. Respiratory: negative for hemoptysis, wheezing, shortness of breath, or cough Abdominal: negative for abdominal pain, nausea, vomiting, diarrhea, or constipation Dermatological: negative for rash Neurologic: negative for headache, dizziness, or syncope All other systems reviewed and are otherwise negative with the exception to those above and in the HPI.   Physical Exam: Blood pressure 140/90, pulse 75, temperature 98.7 F (37.1 C), temperature source Oral, resp. rate 16, height 6' (1.829 m), weight 195 lb (88.451 kg), SpO2  99.00%., Body mass index is 26.44 kg/(m^2). General: Well developed, well nourished, in no acute distress. Head: Normocephalic, atraumatic, eyes without discharge, sclera non-icteric, nares are without discharge. Bilateral auditory canals clear, TM's are without perforation, pearly grey and translucent with reflective cone of light bilaterally. Oral cavity moist, posterior pharynx without exudate, erythema, peritonsillar abscess, or post nasal drip.  Neck: Supple. No  thyromegaly. Full ROM. No lymphadenopathy. Lungs: Clear bilaterally to auscultation without wheezes, rales, or rhonchi. Breathing is unlabored. Heart: RRR with S1 S2. No murmurs, rubs, or gallops appreciated. Abdomen: Soft, non-tender, non-distended with normoactive bowel sounds. No hepatomegaly. No rebound/guarding. No obvious abdominal masses. Msk:  Strength and tone normal for age. Extremities/Skin: Warm and dry. No clubbing or cyanosis. No edema. No rashes or suspicious lesions. Bilateral 2+ pitting edema, worse on right Neuro: Alert and oriented X 3. Moves all extremities spontaneously. Gait is normal. CNII-XII grossly in tact. Psych:  Responds to questions appropriately with a normal affect.   Labs:   ASSESSMENT AND PLAN:  48 y.o. year old male with Hypertension - Plan: cloNIDine (CATAPRES) 0.2 MG tablet  Pedal edema - Plan: furosemide (LASIX) 20 MG tablet  Allergic rhinitis - Plan: hydrOXYzine (VISTARIL) 100 MG capsule      Signed, Robyn Haber, MD 09/16/2013 1:52 PM  HPI Review of Systems Physical Exam

## 2014-01-15 ENCOUNTER — Other Ambulatory Visit: Payer: Self-pay | Admitting: Family Medicine

## 2014-02-02 ENCOUNTER — Encounter (HOSPITAL_COMMUNITY): Payer: Self-pay | Admitting: Emergency Medicine

## 2014-02-02 ENCOUNTER — Ambulatory Visit (INDEPENDENT_AMBULATORY_CARE_PROVIDER_SITE_OTHER): Payer: BC Managed Care – PPO | Admitting: Family Medicine

## 2014-02-02 ENCOUNTER — Emergency Department (HOSPITAL_COMMUNITY)
Admission: EM | Admit: 2014-02-02 | Discharge: 2014-02-02 | Disposition: A | Discharge status: Home / Self Care | Payer: BC Managed Care – PPO | Attending: Emergency Medicine | Admitting: Emergency Medicine

## 2014-02-02 ENCOUNTER — Emergency Department (HOSPITAL_COMMUNITY): Payer: BC Managed Care – PPO

## 2014-02-02 VITALS — BP 166/102 | HR 67 | Temp 98.9°F | Resp 18 | Ht 71.0 in | Wt 205.0 lb

## 2014-02-02 DIAGNOSIS — Z79899 Other long term (current) drug therapy: Secondary | ICD-10-CM | POA: Insufficient documentation

## 2014-02-02 DIAGNOSIS — M7989 Other specified soft tissue disorders: Secondary | ICD-10-CM | POA: Insufficient documentation

## 2014-02-02 DIAGNOSIS — R110 Nausea: Secondary | ICD-10-CM | POA: Insufficient documentation

## 2014-02-02 DIAGNOSIS — Z87898 Personal history of other specified conditions: Secondary | ICD-10-CM

## 2014-02-02 DIAGNOSIS — Z970 Presence of artificial eye: Secondary | ICD-10-CM | POA: Insufficient documentation

## 2014-02-02 DIAGNOSIS — IMO0002 Reserved for concepts with insufficient information to code with codable children: Secondary | ICD-10-CM | POA: Insufficient documentation

## 2014-02-02 DIAGNOSIS — J3489 Other specified disorders of nose and nasal sinuses: Secondary | ICD-10-CM | POA: Insufficient documentation

## 2014-02-02 DIAGNOSIS — R6883 Chills (without fever): Secondary | ICD-10-CM | POA: Insufficient documentation

## 2014-02-02 DIAGNOSIS — I252 Old myocardial infarction: Secondary | ICD-10-CM | POA: Insufficient documentation

## 2014-02-02 DIAGNOSIS — R51 Headache: Secondary | ICD-10-CM

## 2014-02-02 DIAGNOSIS — I10 Essential (primary) hypertension: Secondary | ICD-10-CM

## 2014-02-02 DIAGNOSIS — E876 Hypokalemia: Secondary | ICD-10-CM

## 2014-02-02 LAB — CBC WITH DIFFERENTIAL/PLATELET
BASOS ABS: 0 10*3/uL (ref 0.0–0.1)
BASOS PCT: 0 % (ref 0–1)
Eosinophils Absolute: 0.1 10*3/uL (ref 0.0–0.7)
Eosinophils Relative: 1 % (ref 0–5)
HCT: 37.6 % — ABNORMAL LOW (ref 39.0–52.0)
Hemoglobin: 12.6 g/dL — ABNORMAL LOW (ref 13.0–17.0)
LYMPHS PCT: 25 % (ref 12–46)
Lymphs Abs: 1.4 10*3/uL (ref 0.7–4.0)
MCH: 31.3 pg (ref 26.0–34.0)
MCHC: 33.5 g/dL (ref 30.0–36.0)
MCV: 93.5 fL (ref 78.0–100.0)
Monocytes Absolute: 0.4 10*3/uL (ref 0.1–1.0)
Monocytes Relative: 7 % (ref 3–12)
NEUTROS PCT: 67 % (ref 43–77)
Neutro Abs: 3.7 10*3/uL (ref 1.7–7.7)
Platelets: 206 10*3/uL (ref 150–400)
RBC: 4.02 MIL/uL — ABNORMAL LOW (ref 4.22–5.81)
RDW: 12.4 % (ref 11.5–15.5)
WBC: 5.6 10*3/uL (ref 4.0–10.5)

## 2014-02-02 LAB — I-STAT CHEM 8, ED
BUN: 13 mg/dL (ref 6–23)
CHLORIDE: 104 meq/L (ref 96–112)
Calcium, Ion: 1.11 mmol/L — ABNORMAL LOW (ref 1.12–1.23)
Creatinine, Ser: 1.2 mg/dL (ref 0.50–1.35)
Glucose, Bld: 114 mg/dL — ABNORMAL HIGH (ref 70–99)
HEMATOCRIT: 41 % (ref 39.0–52.0)
HEMOGLOBIN: 13.9 g/dL (ref 13.0–17.0)
POTASSIUM: 3.2 meq/L — AB (ref 3.7–5.3)
SODIUM: 145 meq/L (ref 137–147)
TCO2: 26 mmol/L (ref 0–100)

## 2014-02-02 LAB — TROPONIN I: Troponin I: <0.3 ng/mL (ref <0.30)

## 2014-02-02 MED ORDER — AMLODIPINE BESYLATE 5 MG PO TABS: ORAL_TABLET | ORAL | Status: DC

## 2014-02-02 MED ORDER — POTASSIUM CHLORIDE CRYS ER 20 MEQ PO TBCR: EXTENDED_RELEASE_TABLET | ORAL | Status: DC

## 2014-02-02 MED ORDER — SODIUM CHLORIDE 0.9 % IV SOLN
Freq: Once | INTRAVENOUS | Status: AC
  Administered 2014-02-02: 03:00:00 via INTRAVENOUS

## 2014-02-02 MED ORDER — MORPHINE SULFATE 4 MG/ML IJ SOLN
4.0000 mg | Freq: Once | INTRAMUSCULAR | Status: AC
  Administered 2014-02-02: 4 mg via INTRAVENOUS
  Filled 2014-02-02: qty 1

## 2014-02-02 MED ORDER — LABETALOL HCL 5 MG/ML IV SOLN
40.0000 mg | Freq: Once | INTRAVENOUS | Status: AC
  Administered 2014-02-02: 40 mg via INTRAVENOUS
  Filled 2014-02-02: qty 8

## 2014-02-02 MED ORDER — ONDANSETRON HCL 4 MG/2ML IJ SOLN
4.0000 mg | Freq: Once | INTRAMUSCULAR | Status: AC
  Administered 2014-02-02: 4 mg via INTRAVENOUS
  Filled 2014-02-02: qty 2

## 2014-02-02 MED ORDER — LABETALOL HCL 5 MG/ML IV SOLN
20.0000 mg | Freq: Once | INTRAVENOUS | Status: AC
  Administered 2014-02-02: 20 mg via INTRAVENOUS
  Filled 2014-02-02: qty 4

## 2014-02-02 NOTE — ED Provider Notes (Signed)
CSN: [id number]     Arrival date & time 02/02/14  0159 History   First MD Initiated Contact with Patient 02/02/14 [phone]     Chief Complaint  Patient presents with  . Headache     (Consider location/radiation/quality/duration/timing/severity/associated sxs/prior Treatment) HPI Comments: PAtient with Hx HTN currently on Clonidine BID states has been compliant  but tonight at about 6:30 developed headaches  Check BP was very elevated 220/138  EMS called for transport  Patient is a 48 y.o. male presenting with headaches. The history is provided by the patient and the spouse.  Headache Pain location:  Generalized Quality:  Stabbing Radiates to:  Does not radiate Severity currently:  6/10 Severity at highest:  8/10 Onset quality:  Gradual Duration:  6 hours Timing:  Constant Progression:  Unchanged Chronicity:  Recurrent Similar to prior headaches: yes   Context comment:  Elevated blood pressure Relieved by:  Nothing Worsened by:  Nothing tried Ineffective treatments:  None tried Associated symptoms: congestion and nausea   Associated symptoms: no abdominal pain, no back pain, no dizziness, no drainage, no ear pain, no facial pain, no fever, no focal weakness, no near-syncope, no neck pain, no numbness, no seizures, no sinus pressure, no syncope and no weakness   Congestion:    Location:  Nasal Nausea:    Onset quality:  Gradual   Duration:  6 hours   Timing:  Intermittent   Progression:  Unchanged   Past Medical History  Diagnosis Date  . Hypertension   . MI (myocardial infarction)    Past Surgical History  Procedure Laterality Date  . Hernia repair    . Tooth extraction Right 04/21/2013    Procedure: EXTRACTION MOLARS;  Surgeon: [name], DDS;  Location: [facility];  Service: Oral Surgery;  Laterality: Right;  . Eye removed    . Intraocular prostheses insertion     Family History  Problem Relation Age of Onset  . Hypertension Father   . Depression Brother     History  Substance Use Topics  . Smoking status: Never Smoker   . Smokeless tobacco: Not on file  . Alcohol Use: No    Review of Systems  Constitutional: Positive for chills. Negative for fever.  HENT: Positive for congestion. Negative for ear pain, postnasal drip, rhinorrhea and sinus pressure.   Respiratory: Negative for shortness of breath.   Cardiovascular: Positive for leg swelling. Negative for chest pain, syncope and near-syncope.  Gastrointestinal: Positive for nausea. Negative for abdominal pain.  Genitourinary: Negative for dysuria and frequency.  Musculoskeletal: Negative for back pain and neck pain.  Neurological: Positive for headaches. Negative for dizziness, focal weakness, seizures, speech difficulty, weakness and numbness.  All other systems reviewed and are negative.     Allergies  Review of patient's allergies indicates no known allergies.  Home Medications   Prior to Admission medications   Medication Sig Start Date End Date Taking? Authorizing Provider  cloNIDine (CATAPRES) 0.2 MG tablet Take 1 tablet (0.2 mg total) by mouth 2 (two) times daily. 09/16/13  Yes Robyn Haber, MD  furosemide (LASIX) 20 MG tablet Take 20 mg by mouth daily.   Yes Historical Provider, MD  hydrOXYzine (VISTARIL) 100 MG capsule Take 100 mg by mouth daily as needed (allergies.).   Yes Historical Provider, MD  prednisoLONE acetate (PRED FORTE) 1 % ophthalmic suspension Place 1 drop into the left eye 4 (four) times daily. 01/21/14  Yes Historical Provider, MD   BP 133/89  Pulse 74  Temp(Src) 97.9 F (36.6 C) (Oral)  Resp 18  SpO2 97% Physical Exam  Constitutional: He is oriented to person, place, and time. He appears well-developed and well-nourished. No distress.  HENT:  Head: Normocephalic and atraumatic.  Mouth/Throat: Oropharynx is clear and moist.  Eyes: Right eye exhibits no discharge. No scleral icterus.  L eye artificial  Neck: Normal range of motion.   Cardiovascular: Normal rate and regular rhythm.   Pulmonary/Chest: Effort normal and breath sounds normal. No respiratory distress. He has no wheezes.  Abdominal: Soft. Bowel sounds are normal. He exhibits no distension. There is no tenderness.  Musculoskeletal: Normal range of motion.  Lymphadenopathy:    He has no cervical adenopathy.  Neurological: He is alert and oriented to person, place, and time.  Skin: Skin is warm and dry. No rash noted. No pallor.  Psychiatric: His behavior is normal.    ED Course  Procedures (including critical care time) Labs Review Labs Reviewed  CBC WITH DIFFERENTIAL - Abnormal; Notable for the following:    RBC 4.02 (*)    Hemoglobin 12.6 (*)    HCT 37.6 (*)    All other components within normal limits  I-STAT CHEM 8, ED - Abnormal; Notable for the following:    Potassium 3.2 (*)    Glucose, Bld 114 (*)    Calcium, Ion 1.11 (*)    All other components within normal limits  TROPONIN I    Imaging Review Ct Head Wo Contrast  02/02/2014   CLINICAL DATA:  48 year old male with hypertension and headache. Initial encounter.  EXAM: CT HEAD WITHOUT CONTRAST  TECHNIQUE: Contiguous axial images were obtained from the base of the skull through the vertex without intravenous contrast.  COMPARISON:  Head CT 09/07/2006.  FINDINGS: Chronic paranasal sinus mucosal thickening, improved aeration since 2008. No acute osseous abnormality identified.  No acute orbit or scalp soft tissue findings. Left orbital prosthesis.  Stable cerebral volume. No ventriculomegaly. No midline shift, mass effect, or evidence of intracranial mass lesion. Chronic patchy white matter hypodensity. 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 No evidence of cortically based acute infarction identified. No suspicious intracranial vascular hyperdensity. No acute intracranial hemorrhage identified.  IMPRESSION: No acute intracranial abnormality. 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   Electronically Signed   By: Lars Pinks M.D.   On: 02/02/2014 02:54     EKG Interpretation None      Date: 02/02/2014  Rate: 65  Rhythm: normal sinus rhythm  QRS Axis: normal  Intervals: normal  ST/T Wave abnormalities: normal LVH  Conduction Disutrbances:none  Narrative Interpretation: LVH  Old EKG Reviewed: none available   MDM  Patient feeling better after 2 doses of IV Labetolol  Labs normal head ct normal ekg  Discussed with Dr. Fabio Neighbors  Who recommends changing HTN medications to a beta blocker and ACE inhibitor  But will defer to PCP. Patient has appointment today at [facility] with PCP.  Final diagnoses:  Hypertension not at goal         Garald Balding, NP 02/02/14 0503  Garald Balding, NP 02/02/14 [phone]

## 2014-02-02 NOTE — ED Notes (Signed)
Pt states he returned from work yesterday @ 1600 with L sided HA with facial pain 9-10/10. Some chest pressure noted. Pt states he is compliant with all medications. A & O

## 2014-02-02 NOTE — ED Notes (Addendum)
Notified EDP,Dylan Gonzalez, pt. I-stat Chem 8 pt. Potassium 3.2.

## 2014-02-02 NOTE — Patient Instructions (Signed)
Continue the clonidine one twice daily  Take the potassium one twice daily for 3 days, then reduce to one daily  Begin amlodipine 5 mg one daily for blood pressure  Continue taking the furosemide, but reduce it to one half tablet each morning  Return in one week for followup on your blood pressure. In the meanwhile I would like you to get your blood pressure rechecked on Monday [facility]. If it is running greater than 160/95 please call.

## 2014-02-02 NOTE — ED Provider Notes (Signed)
Medical screening examination/treatment/procedure(s) were performed by non-physician practitioner and as supervising physician I was immediately available for consultation/collaboration.   EKG Interpretation None        Julianne Rice, MD 02/02/14 [phone]

## 2014-02-02 NOTE — ED Notes (Signed)
Pt to radiology via stretcher.  

## 2014-02-02 NOTE — Progress Notes (Signed)
Subjective: Yesterday afternoon he feeling bad headache behind his left eye. He got steadily worse and went to the emergency room at night. Blood pressure was quite high. They evaluated him with labs and EKG. His potassium level was low but apparently the EKG was okay. He did not have any change of medications, that I gave him some stuff which waxed part pressure down. He has not taking his clonidine today because they told him they could cause some rebound hypertension. I am not quite certain why he has had this accelerated hypertension today, for his blood pressure been fairly well-controlled prior to this.  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 No carotid bruits. Chest clear. Heart regular without murmurs gallops or arrhythmias. Blood pressure on high at 174/103. He was instructed to go ahead and take his 0.2 mg clonidine dose now while he is here in the office.  Assessment: Accelerated hypertension Hypokalemia Headaches  Plan: Begin amlodipine 5 mg daily. Continue taking the clonidine. The clonidine we'll probably be tapered off and the  Amlodipine increased if it is doing the job. Advised to return in about one week to see Dr. [name] or me. Check his blood pressures at home. Return at any time if needed.  With the hypokalemia he has been begun on a little bit of potassium to raise this. If an ACE inhibitor is started on him for the blood pressure this needs to be noted. He is on the furosemide or edema, and has done a good job. However that is the cause of his hypokalemia. It may be necessary to take him off of the furosemide, but for now I decreased the dose to one half tablet daily. Consider changing him to something like an ACE and thiazide combination.

## 2014-02-02 NOTE — ED Notes (Signed)
Pt returned from CT °

## 2014-02-02 NOTE — ED Notes (Signed)
Notified NP,Gail pt. Blood pressure elevated 175/115.

## 2014-02-02 NOTE — ED Notes (Signed)
Bed: NK53 Expected date:  Expected time:  Means of arrival:  Comments: EMS/headache, dizziness, hypertensive

## 2014-02-02 NOTE — ED Notes (Signed)
Per EMS pt transported from home w/complaints of headache and HTN. Pt reports he is compliant with medication (Clonodine) reports pain 8/10, onset 1630 today.

## 2014-02-02 NOTE — Discharge Instructions (Signed)
Hypertension As your heart beats, it forces blood through your arteries. This force is your blood pressure. If the pressure is too high, it is called hypertension (HTN) or high blood pressure. HTN is dangerous because you may have it and not know it. High blood pressure may mean that your heart has to work harder to pump blood. Your arteries may be narrow or stiff. The extra work puts you at risk for heart disease, stroke, and other problems.  Blood pressure consists of two numbers, a higher number over a lower, 110/72, for example. It is stated as "110 over 72." The ideal is below 120 for the top number (systolic) and under 80 for the bottom (diastolic). Write down your blood pressure today. You should pay close attention to your blood pressure if you have certain conditions such as:  Heart failure.  Prior heart attack.  Diabetes  Chronic kidney disease.  Prior stroke.  Multiple risk factors for heart disease. To see if you have HTN, your blood pressure should be measured while you are seated with your arm held at the level of the heart. It should be measured at least twice. A one-time elevated blood pressure reading (especially in the Emergency Department) does not mean that you need treatment. There may be conditions in which the blood pressure is different between your right and left arms. It is important to see your caregiver soon for a recheck. Most people have essential hypertension which means that there is not a specific cause. This type of high blood pressure may be lowered by changing lifestyle factors such as:  Stress.  Smoking.  Lack of exercise.  Excessive weight.  Drug/tobacco/alcohol use.  Eating less salt. Most people do not have symptoms from high blood pressure until it has caused damage to the body. Effective treatment can often prevent, delay or reduce that damage. TREATMENT  When a cause has been identified, treatment for high blood pressure is directed at the  cause. There are a large number of medications to treat HTN. These fall into several categories, and your caregiver will help you select the medicines that are best for you. Medications may have side effects. You should review side effects with your caregiver. If your blood pressure stays high after you have made lifestyle changes or started on medicines,   Your medication(s) may need to be changed.  Other problems may need to be addressed.  Be certain you understand your prescriptions, and know how and when to take your medicine.  Be sure to follow up with your caregiver within the time frame advised (usually within two weeks) to have your blood pressure rechecked and to review your medications.  If you are taking more than one medicine to lower your blood pressure, make sure you know how and at what times they should be taken. Taking two medicines at the same time can result in blood pressure that is too low. SEEK IMMEDIATE MEDICAL CARE IF:  You develop a severe headache, blurred or changing vision, or confusion.  You have unusual weakness or numbness, or a faint feeling.  You have severe chest or abdominal pain, vomiting, or breathing problems. MAKE SURE YOU:   Understand these instructions.  Will watch your condition.  Will get help right away if you are not doing well or get worse. Document Released: 08/23/2005 Document Revised: 11/15/2011 Document Reviewed: 04/12/2008 [facility] Patient Information 2014 [place]. Today you received a medication called Labetalol to help control your blood pressure   At  discharge your symptoms had improved and your blood pressure normalized  It is out recommendation that your blood pressure medication be change to a Beta Blocker and ACE inhibitor   Your PCP can decide exactly which of these medication is best for you

## 2014-02-02 NOTE — ED Notes (Signed)
EKG given to EDP, Yelver[place],MD., for review.

## 2014-02-03 ENCOUNTER — Ambulatory Visit (INDEPENDENT_AMBULATORY_CARE_PROVIDER_SITE_OTHER): Payer: BC Managed Care – PPO | Admitting: Family Medicine

## 2014-02-03 ENCOUNTER — Encounter: Payer: Self-pay | Admitting: Family Medicine

## 2014-02-03 VITALS — BP 148/100 | HR 87 | Temp 98.1°F | Resp 14 | Ht 71.0 in | Wt 203.2 lb

## 2014-02-03 DIAGNOSIS — I10 Essential (primary) hypertension: Secondary | ICD-10-CM

## 2014-02-03 MED ORDER — CLONIDINE HCL 0.2 MG/24HR TD PTWK: 0.2000 mg | MEDICATED_PATCH | TRANSDERMAL | Status: DC

## 2014-02-03 NOTE — Progress Notes (Signed)
° °  Subjective:    Patient ID: Dylan Gonzalez, male    DOB: 05-06-1966, 48 y.o.   MRN: [id number]  Hypertension Pertinent negatives include no headaches.   Chief Complaint  Patient presents with   Hypertension   This chart was scribed for Robyn Haber, MD by Thea Alken, ED Scribe. This patient was seen in room 1 and the patient's care was started at 12:55 PM.  HPI Comments: Dylan Gonzalez is a 48 y.o. male who presents to the [facility] complaining of HTN. Pt was seen in the ED 1 day ago for HTN. Pt is here to find the right medication to help control BP. He reports he is currently taking Lasix once a day. Pt was also prescribed potassium but has not started taking it yet. Pt has clonidine at home but is not taking it at this time. Pt reports he was taking clonidine in the morning which was fluctuating his BP and at times making him dizzy. Pt believes he may have missed a dose. Pt denies HA at this time.  Pt asked about colonoscopy. Pt reports has never had colonoscopy. He denies bowel incontinence.  Pt reports an appointment with the optometrist in a couple weeks.  Pt works at [employer]   Past Medical History  Diagnosis Date   Hypertension    MI (myocardial infarction)    No Known Allergies Prior to Admission medications   Medication Sig Start Date End Date Taking? Authorizing Provider  amLODipine (NORVASC) 5 MG tablet Take one daily for blood pressure 02/02/14  Yes Posey Boyer, MD  cloNIDine (CATAPRES) 0.2 MG tablet Take 1 tablet (0.2 mg total) by mouth 2 (two) times daily. 09/16/13  Yes Robyn Haber, MD  furosemide (LASIX) 20 MG tablet Take 20 mg by mouth daily.   Yes Historical Provider, MD  hydrOXYzine (VISTARIL) 100 MG capsule Take 100 mg by mouth daily as needed (allergies.).   Yes Historical Provider, MD  potassium chloride SA (K-DUR,KLOR-CON) 20 MEQ tablet Take one twice daily for 3 days, then one once daily 02/02/14  Yes Posey Boyer, MD    prednisoLONE acetate (PRED FORTE) 1 % ophthalmic suspension Place 1 drop into the left eye 4 (four) times daily. 01/21/14  Yes Historical Provider, MD   Review of Systems  Gastrointestinal: Negative for diarrhea, constipation, blood in stool and anal bleeding.  Neurological: Negative for dizziness and headaches.     Objective:   Physical Exam  Nursing note and vitals reviewed. Constitutional: He is oriented to person, place, and time. He appears well-developed and well-nourished. No distress.  HENT:  Head: Normocephalic and atraumatic.  Eyes: EOM are normal.  Neck: Neck supple. No tracheal deviation present.  Cardiovascular: Normal rate.   Pulmonary/Chest: Effort normal. No respiratory distress.  Musculoskeletal: Normal range of motion.  Neurological: He is alert and oriented to person, place, and time.  Skin: Skin is warm and dry.  Psychiatric: He has a normal mood and affect. His behavior is normal.      Assessment & Plan:   1. Accelerated hypertension    Meds ordered this encounter  Medications   cloNIDine (CATAPRES-TTS-2) 0.2 mg/24hr patch    Sig: Place 1 patch (0.2 mg total) onto the skin once a week.    Dispense:  4 patch    Refill:  12  follow up 3 weeks  Robyn Haber, MD

## 2014-02-03 NOTE — Patient Instructions (Signed)

## 2014-02-20 ENCOUNTER — Other Ambulatory Visit: Payer: Self-pay | Admitting: Family Medicine

## 2014-03-05 ENCOUNTER — Other Ambulatory Visit: Payer: Self-pay | Admitting: Family Medicine

## 2014-04-21 ENCOUNTER — Ambulatory Visit (INDEPENDENT_AMBULATORY_CARE_PROVIDER_SITE_OTHER): Payer: BC Managed Care – PPO | Admitting: Family Medicine

## 2014-04-21 VITALS — BP 114/82 | HR 90 | Temp 97.9°F | Resp 18 | Ht 71.0 in | Wt 200.0 lb

## 2014-04-21 DIAGNOSIS — R609 Edema, unspecified: Secondary | ICD-10-CM

## 2014-04-21 DIAGNOSIS — I10 Essential (primary) hypertension: Secondary | ICD-10-CM

## 2014-04-21 DIAGNOSIS — R600 Localized edema: Secondary | ICD-10-CM

## 2014-04-21 DIAGNOSIS — J320 Chronic maxillary sinusitis: Secondary | ICD-10-CM

## 2014-04-21 MED ORDER — LOSARTAN POTASSIUM-HCTZ 100-12.5 MG PO TABS: 1.0000 | ORAL_TABLET | Freq: Every day | ORAL | Status: DC

## 2014-04-21 MED ORDER — AMOXICILLIN 875 MG PO TABS: 875.0000 mg | ORAL_TABLET | Freq: Two times a day (BID) | ORAL | Status: DC

## 2014-04-21 NOTE — Patient Instructions (Signed)
Blood pressure looks good. Continue the Lasix and we'll stop the potassium for now until I get the results of the blood test. I will switch from amlodipine to  tolosartan  to try to help with the ankle swelling.

## 2014-04-21 NOTE — Progress Notes (Signed)
Subjective:    Patient ID: Dylan Gonzalez, male    DOB: 08/01/1966, 48 y.o.   MRN: [id number] This chart was scribed for Robyn Haber, MD by Steva Colder, ED Scribe. The patient was seen in room 8 at 3:38 PM.   Chief Complaint  Patient presents with  . Follow-up    BP check    HPI HPI Comments: Dylan Gonzalez is a 48 y.o. male with a medical hx of HTN who presents today complaining of a F/U for his BP. He states that he has been feeling dehydrated lately. He states that he is having associated symptoms of right leg swelling, dehydration, SOB, congestion. He states that he has had cold symptoms for about 1-2 weeks. He states that the SOB comes when he is trying to lay down. He states that when he goes up a flight of stairs he is fine. He states that he still has all his medicines. He denies cough. He states that he has been taking his potassium as he was Rx. He voices concern about his BP because it spiked a couple weeks ago. 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 He has had a retinoblastoma procedure done on his left eye.    Patient Active Problem List   Diagnosis Date Noted  . Hypertension 12/26/2011  . 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 Hypertension   . MI (myocardial infarction)    Past Surgical History  Procedure Laterality Date  . Hernia repair    . Tooth extraction Right 04/21/2013    Procedure: EXTRACTION MOLARS;  Surgeon: [name], DDS;  Location: [facility];  Service: Oral Surgery;  Laterality: Right;  . Eye removed    . Intraocular prostheses insertion     No Known Allergies Prior to Admission medications   Medication Sig Start Date End Date Taking? Authorizing Provider  amLODipine (NORVASC) 5 MG tablet Take one daily for blood pressure 02/02/14  Yes Posey Boyer, MD  cloNIDine (CATAPRES-TTS-2) 0.2 mg/24hr patch Place 1 patch (0.2 mg total) onto the skin once a week. 02/03/14  Yes Robyn Haber, MD  furosemide  (LASIX) 20 MG tablet Take 20 mg by mouth daily.   Yes Historical Provider, MD  hydrOXYzine (VISTARIL) 100 MG capsule Take 100 mg by mouth daily as needed (allergies.).   Yes Historical Provider, MD  potassium chloride SA (KLOR-CON M20) 20 MEQ tablet Take 1 tablet (20 mEq total) by mouth once. PATIENT NEEDS OFFICE VISIT FOR ADDITIONAL REFILLS 03/05/14  Yes Robyn Haber, MD  prednisoLONE acetate (PRED FORTE) 1 % ophthalmic suspension Place 1 drop into the left eye 4 (four) times daily. 01/21/14  Yes Historical Provider, MD      Review of Systems  HENT: Positive for congestion.   Respiratory: Positive for shortness of breath. Negative for cough.      Objective:  Physical Exam  Nursing note and vitals reviewed. Constitutional: He is oriented to person, place, and time. He appears well-developed and well-nourished. No distress.  HENT:  Head: Normocephalic and atraumatic.  Mucopurulent present in the nose.   Eyes: EOM are normal.  Neck: Neck supple. No tracheal deviation present.  Cardiovascular: Normal rate, regular rhythm and normal heart sounds.   No murmur heard. Pulmonary/Chest: Effort normal and breath sounds normal. No respiratory distress.  Musculoskeletal: Normal range of motion. He exhibits edema.  Mild edema bilaterally  Neurological: He is alert and oriented to person, place, and time.  Skin: Skin is warm and dry.  Psychiatric: He has a normal mood and affect. His behavior is normal.       BP 114/82  Pulse 90  Temp(Src) 97.9 F (36.6 C) (Oral)  Resp 18  Ht 5\' 11"  (1.803 m)  Wt 200 lb (90.719 kg)  BMI 27.91 kg/m2  SpO2 98%   Assessment & Plan:  DIAGNOSTIC STUDIES: Oxygen Saturation is 98% on room air, normal by my interpretation.    COORDINATION OF CARE: 3:44 PM-Discussed treatment plan which includes Amoxicillin, labs, and Losartan with pt at bedside and pt agreed to plan.   1. Essential hypertension, benign   2. Maxillary sinusitis, unspecified chronicity    3. Pedal edema     I personally performed the services described in this documentation, which was scribed in my presence. The recorded information has been reviewed and is accurate.  Essential hypertension, benign - Plan: losartan-hydrochlorothiazide (HYZAAR) 100-12.5 MG per tablet, Comprehensive metabolic panel  Maxillary sinusitis, unspecified chronicity - Plan: amoxicillin (AMOXIL) 875 MG tablet  Pedal edema  Signed, Robyn Haber, MD

## 2014-04-22 LAB — COMPREHENSIVE METABOLIC PANEL
ALT: 16 U/L (ref 0–53)
AST: 20 U/L (ref 0–37)
Albumin: 4.2 g/dL (ref 3.5–5.2)
Alkaline Phosphatase: 60 U/L (ref 39–117)
BUN: 12 mg/dL (ref 6–23)
CO2: 31 mEq/L (ref 19–32)
Calcium: 9.4 mg/dL (ref 8.4–10.5)
Chloride: 106 mEq/L (ref 96–112)
Creat: 1.44 mg/dL — ABNORMAL HIGH (ref 0.50–1.35)
Glucose, Bld: 100 mg/dL — ABNORMAL HIGH (ref 70–99)
Potassium: 4 mEq/L (ref 3.5–5.3)
Sodium: 144 mEq/L (ref 135–145)
Total Bilirubin: 0.5 mg/dL (ref 0.2–1.2)
Total Protein: 7.1 g/dL (ref 6.0–8.3)

## 2014-06-01 ENCOUNTER — Ambulatory Visit (INDEPENDENT_AMBULATORY_CARE_PROVIDER_SITE_OTHER): Payer: BC Managed Care – PPO | Admitting: Family Medicine

## 2014-06-01 VITALS — BP 162/106 | HR 72 | Temp 97.7°F | Resp 16 | Ht 71.0 in | Wt 197.6 lb

## 2014-06-01 DIAGNOSIS — R631 Polydipsia: Secondary | ICD-10-CM

## 2014-06-01 DIAGNOSIS — R799 Abnormal finding of blood chemistry, unspecified: Secondary | ICD-10-CM

## 2014-06-01 DIAGNOSIS — R339 Retention of urine, unspecified: Secondary | ICD-10-CM

## 2014-06-01 DIAGNOSIS — I10 Essential (primary) hypertension: Secondary | ICD-10-CM

## 2014-06-01 DIAGNOSIS — R7989 Other specified abnormal findings of blood chemistry: Secondary | ICD-10-CM

## 2014-06-01 DIAGNOSIS — R350 Frequency of micturition: Secondary | ICD-10-CM

## 2014-06-01 DIAGNOSIS — J3489 Other specified disorders of nose and nasal sinuses: Secondary | ICD-10-CM

## 2014-06-01 LAB — COMPLETE METABOLIC PANEL WITH GFR
Albumin: 4.4 g/dL (ref 3.5–5.2)
Alkaline Phosphatase: 67 U/L (ref 39–117)
BUN: 10 mg/dL (ref 6–23)
CO2: 30 mEq/L (ref 19–32)
Calcium: 9.6 mg/dL (ref 8.4–10.5)
Chloride: 103 mEq/L (ref 96–112)
Creat: 1.07 mg/dL (ref 0.50–1.35)
GFR, Est Non African American: 82 mL/min
Glucose, Bld: 102 mg/dL — ABNORMAL HIGH (ref 70–99)
Potassium: 3.9 mEq/L (ref 3.5–5.3)
Sodium: 141 mEq/L (ref 135–145)
Total Protein: 7.7 g/dL (ref 6.0–8.3)

## 2014-06-01 LAB — COMPLETE METABOLIC PANEL WITHOUT GFR
ALT: 15 U/L (ref 0–53)
AST: 23 U/L (ref 0–37)
GFR, Est African American: >89 mL/min
Total Bilirubin: 0.8 mg/dL (ref 0.2–1.2)

## 2014-06-01 LAB — POCT UA - MICROSCOPIC ONLY
Bacteria, U Microscopic: NEGATIVE
Casts, Ur, LPF, POC: NEGATIVE
Crystals, Ur, HPF, POC: NEGATIVE
Mucus, UA: NEGATIVE
WBC, Ur, HPF, POC: NEGATIVE
Yeast, UA: NEGATIVE

## 2014-06-01 LAB — POCT URINALYSIS DIPSTICK
Bilirubin, UA: NEGATIVE
Blood, UA: NEGATIVE
Glucose, UA: NEGATIVE
Ketones, UA: NEGATIVE
Leukocytes, UA: NEGATIVE
Nitrite, UA: NEGATIVE
Protein, UA: NEGATIVE
Spec Grav, UA: 1.01
Urobilinogen, UA: 0.2
pH, UA: 6

## 2014-06-01 LAB — POCT GLYCOSYLATED HEMOGLOBIN (HGB A1C): Hemoglobin A1C: 5.8

## 2014-06-01 LAB — MICROALBUMIN, URINE: Microalb, Ur: 0.5 mg/dL (ref <2.0)

## 2014-06-01 MED ORDER — TAMSULOSIN HCL 0.4 MG PO CAPS: 0.4000 mg | ORAL_CAPSULE | Freq: Every day | ORAL | Status: DC

## 2014-06-01 MED ORDER — FLUTICASONE PROPIONATE 50 MCG/ACT NA SUSP: 2.0000 | Freq: Every day | NASAL | Status: DC

## 2014-06-01 MED ORDER — METOPROLOL SUCCINATE ER 25 MG PO TB24: 25.0000 mg | ORAL_TABLET | Freq: Every day | ORAL | Status: DC

## 2014-06-01 NOTE — Progress Notes (Signed)
Chief Complaint:  Chief Complaint  Patient presents with  . Hypertension  . Sinusitis    x 1 1/2 weeks    HPI: Dylan Gonzalez is a 48 y.o. male who is here for  htn and labs due to decrease kidney fucntion He has had mi in 2007, He was hospitlaized for 5 days He has been taking his blood pressure medicines regular, he was on norvasc and had to stop that due to edema He was started on losartan and hctz and also lasix and has been doing well on this. His BP has been labile so clonodine patch was started He den ies CP, SOB, has some inermittent HA last week.   He feels incomplete emptying when he urinates for the last 3 monthsm he ahs ahd this even prior to the diuretics, denies diabetes.  He has had been thristy and urianting more frequently, deneis neuropathy   Past Medical History  Diagnosis Date  . Hypertension   . MI (myocardial infarction)    Past Surgical History  Procedure Laterality Date  . Hernia repair    . Tooth extraction Right 04/21/2013    Procedure: EXTRACTION MOLARS;  Surgeon: [name], DDS;  Location: [facility];  Service: Oral Surgery;  Laterality: Right;  . Eye removed    . Intraocular prostheses insertion     History   Social History  . Marital Status: Married    Spouse Name: [name]    Number of Children: [name]  . Years of Education: [name]   Social History Main Topics  . Smoking status: Never Smoker   . Smokeless tobacco: None  . Alcohol Use: No  . Drug Use: No  . Sexual Activity: Not Currently   Other Topics Concern  . None   Social History Narrative  . None   Family History  Problem Relation Age of Onset  . Hypertension Father   . Depression Brother    No Known Allergies Prior to Admission medications   Medication Sig Start Date End Date Taking? Authorizing Provider  cloNIDine (CATAPRES-TTS-2) 0.2 mg/24hr patch Place 1 patch (0.2 mg total) onto the skin once a week. 02/03/14  Yes Robyn Haber, MD  furosemide (LASIX) 20 MG tablet  Take 20 mg by mouth daily.   Yes Historical Provider, MD  hydrOXYzine (VISTARIL) 100 MG capsule Take 100 mg by mouth daily as needed (allergies.).   Yes Historical Provider, MD  losartan-hydrochlorothiazide (HYZAAR) 100-12.5 MG per tablet Take 1 tablet by mouth daily. 04/21/14  Yes Robyn Haber, MD     ROS: The patient denies fevers, chills, night sweats, unintentional weight loss, chest pain, palpitations, wheezing, dyspnea on exertion, nausea, vomiting, abdominal pain, dysuria, hematuria, melena, numbness, weakness, or tingling.   All other systems have been reviewed and were otherwise negative with the exception of those mentioned in the HPI and as above.    PHYSICAL EXAM: Filed Vitals:   06/01/14 1201  BP: 162/106  Pulse: 72  Temp: 97.7 F (36.5 C)  Resp: 16   Filed Vitals:   06/01/14 1201  Height: 5\' 11"  (1.803 m)  Weight: 197 lb 9.6 oz (89.631 kg)   Body mass index is 27.57 kg/(m^2).  General: Alert, no acute distress HEENT:  Normocephalic, atraumatic, oropharynx patent. EOMI, PERRLA, fundo exam normal Cardiovascular:  Regular rate and rhythm, no rubs murmurs or gallops.  No Carotid bruits, radial pulse intact. No pedal edema.  Respiratory: Clear to auscultation bilaterally.  No wheezes, rales, or  rhonchi.  No cyanosis, no use of accessory musculature GI: No organomegaly, abdomen is soft and non-tender, positive bowel sounds.  No masses. Skin: No rashes. Neurologic: Facial musculature symmetric. Psychiatric: Patient is appropriate throughout our interaction. Lymphatic: No cervical lymphadenopathy Musculoskeletal: Gait intact. Prostate slightly boggy but not overly large, no masses/lesions, nontender, he has external hemorrhoid   LABS: Results for orders placed in visit on 06/01/14  POCT UA - MICROSCOPIC ONLY      Result Value Ref Range   WBC, Ur, HPF, POC neg     RBC, urine, microscopic 0-1     Bacteria, U Microscopic neg     Mucus, UA neg     Epithelial cells,  urine per micros 0-1     Crystals, Ur, HPF, POC neg     Casts, Ur, LPF, POC neg     Yeast, UA neg    POCT URINALYSIS DIPSTICK      Result Value Ref Range   Color, UA yellow     Clarity, UA clear     Glucose, UA neg     Bilirubin, UA neg     Ketones, UA neg     Spec Grav, UA 1.010     Blood, UA neg     pH, UA 6.0     Protein, UA neg     Urobilinogen, UA 0.2     Nitrite, UA neg     Leukocytes, UA Negative    POCT GLYCOSYLATED HEMOGLOBIN (HGB A1C)      Result Value Ref Range   Hemoglobin A1C 5.8       EKG/XRAY:   Primary read interpreted by Dr. Marin Comment at [facility].   ASSESSMENT/PLAN: Encounter Diagnoses  Name Primary?  . Essential hypertension Yes  . Polydipsia   . Urinary frequency   . Abnormal serum creatinine level   . Incomplete emptying of bladder    Rx metroprolol 25 mg daily, he was on this afer he had MI. He does not remember why he was taken off of it.  I would consider taking him off his Lasix and  increasing his HCTZ to a total of 25 mg if his BP is well controlled and then taper him off clonidine patch in the even he has rebound HTN Rx flomax 0.4 mg for BPH sxs , prostate exam showed slightly boggy prostate but no huge, will see a trial of flomax will helo with nighttime frequent urination and BP Labs pending F/u prn otherwise in 2 weeks,a dvise to monitor for worsening HTn and also dizziness or other SEs   Gross sideeffects, risk and benefits, and alternatives of medications d/w patient. Patient is aware that all medications have potential sideeffects and we are unable to predict every sideeffect or drug-drug interaction that may occur.  LE, [place], DO 06/01/2014 1:47 PM

## 2014-06-01 NOTE — Patient Instructions (Signed)

## 2014-06-03 LAB — PSA: PSA: 0.41 ng/mL (ref <=4.00)

## 2014-06-18 ENCOUNTER — Encounter: Payer: Self-pay | Admitting: Family Medicine

## 2014-06-24 ENCOUNTER — Other Ambulatory Visit: Payer: Self-pay | Admitting: Family Medicine

## 2014-07-12 ENCOUNTER — Emergency Department (HOSPITAL_COMMUNITY)
Admission: EM | Admit: 2014-07-12 | Discharge: 2014-07-12 | Disposition: A | Discharge status: Home / Self Care | Payer: BC Managed Care – PPO | Attending: Emergency Medicine | Admitting: Emergency Medicine

## 2014-07-12 ENCOUNTER — Encounter (HOSPITAL_COMMUNITY): Payer: Self-pay | Admitting: *Deleted

## 2014-07-12 DIAGNOSIS — I10 Essential (primary) hypertension: Secondary | ICD-10-CM | POA: Diagnosis not present

## 2014-07-12 DIAGNOSIS — Z7951 Long term (current) use of inhaled steroids: Secondary | ICD-10-CM | POA: Diagnosis not present

## 2014-07-12 DIAGNOSIS — Z792 Long term (current) use of antibiotics: Secondary | ICD-10-CM | POA: Diagnosis not present

## 2014-07-12 DIAGNOSIS — Z79899 Other long term (current) drug therapy: Secondary | ICD-10-CM | POA: Diagnosis not present

## 2014-07-12 DIAGNOSIS — R599 Enlarged lymph nodes, unspecified: Secondary | ICD-10-CM | POA: Diagnosis not present

## 2014-07-12 DIAGNOSIS — I252 Old myocardial infarction: Secondary | ICD-10-CM | POA: Insufficient documentation

## 2014-07-12 DIAGNOSIS — K088 Other specified disorders of teeth and supporting structures: Secondary | ICD-10-CM | POA: Insufficient documentation

## 2014-07-12 DIAGNOSIS — Z970 Presence of artificial eye: Secondary | ICD-10-CM | POA: Diagnosis not present

## 2014-07-12 DIAGNOSIS — R6883 Chills (without fever): Secondary | ICD-10-CM | POA: Diagnosis not present

## 2014-07-12 DIAGNOSIS — K0889 Other specified disorders of teeth and supporting structures: Secondary | ICD-10-CM

## 2014-07-12 MED ORDER — PENICILLIN V POTASSIUM 500 MG PO TABS
500.0000 mg | ORAL_TABLET | Freq: Once | ORAL | Status: AC
  Administered 2014-07-12: 500 mg via ORAL
  Filled 2014-07-12: qty 1

## 2014-07-12 MED ORDER — OXYCODONE-ACETAMINOPHEN 5-325 MG PO TABS: 1.0000 | ORAL_TABLET | ORAL | Status: DC | PRN

## 2014-07-12 MED ORDER — OXYCODONE-ACETAMINOPHEN 5-325 MG PO TABS
2.0000 | ORAL_TABLET | Freq: Once | ORAL | Status: AC
  Administered 2014-07-12: 2 via ORAL
  Filled 2014-07-12: qty 2

## 2014-07-12 MED ORDER — PENICILLIN V POTASSIUM 500 MG PO TABS: 500.0000 mg | ORAL_TABLET | Freq: Four times a day (QID) | ORAL | Status: AC

## 2014-07-12 NOTE — ED Notes (Signed)
Pt reports right, upper dental pain, radiating to entire mouth, x 1 day

## 2014-07-12 NOTE — ED Provider Notes (Signed)
CSN: [id number]     Arrival date & time 07/12/14  1654 History  This chart was scribed for Waynetta Pean, PA-C, working with Dorie Rank, MD found by Starleen Arms, ED Scribe. This patient was seen in room WTR8/WTR8 and the patient's care was started at 5:28 PM.   Chief Complaint  Patient presents with  . Dental Pain   The history is provided by the patient. No language interpreter was used.   HPI Comments: FRANCESCO PROVENCAL is a 48 y.o. male with a history of HTN who presents to the Emergency Department complaining of 9/10 sharp, shooting right upper dental pain onset this morning.  He reports associated bilateral facial pain, eye pain, and chills.   Patient denies taking any medications for this complaint.  Patient reports a history of dental abscess and two extractions two years ago on his left upper dentition.  Patient reports he is prescribed Losartan, potassium, metoprolol, and clonidine for HTN and is compliant.  Patient is currently wearing his clonidine patch. Patient is not currently followed by a dentist.  Patient denies history of IV drug use.  Patient denies history of HIV.  Patient denies vision changes, headache, fever, SOB, CP, rash, cough, neck pain, ear pain, abdominal pain, nausea, vomiting, constipation, diarrhea.   Past Medical History  Diagnosis Date  . Hypertension   . MI (myocardial infarction)    Past Surgical History  Procedure Laterality Date  . Hernia repair    . Tooth extraction Right 04/21/2013    Procedure: EXTRACTION MOLARS;  Surgeon: [name], DDS;  Location: [facility];  Service: Oral Surgery;  Laterality: Right;  . Eye removed    . Intraocular prostheses insertion     Family History  Problem Relation Age of Onset  . Hypertension Father   . Depression Brother    History  Substance Use Topics  . Smoking status: Never Smoker   . Smokeless tobacco: Not on file  . Alcohol Use: No    Review of Systems  Constitutional: Positive for chills. Negative for  fever.  HENT: Negative for ear discharge, ear pain, facial swelling, hearing loss, mouth sores, rhinorrhea, sinus pressure, sneezing, sore throat, tinnitus and trouble swallowing.   Eyes: Positive for pain. Negative for discharge, itching and visual disturbance.  Respiratory: Negative for cough, shortness of breath and wheezing.   Cardiovascular: Negative for chest pain, palpitations and leg swelling.  Gastrointestinal: Negative for nausea, vomiting, abdominal pain, diarrhea and constipation.  Genitourinary: Negative for dysuria.  Musculoskeletal: Negative for neck pain and neck stiffness.  Skin: Negative for rash and wound.  Neurological: Negative for dizziness, tremors, weakness, light-headedness, numbness and headaches.  All other systems reviewed and are negative.     Allergies  Review of patient's allergies indicates no known allergies.  Home Medications   Prior to Admission medications   Medication Sig Start Date End Date Taking? Authorizing Provider  amLODipine (NORVASC) 5 MG tablet TAKE 1 TABLET BY MOUTH EVERY DAY FOR BLOOD PRESSURE 06/25/14   Robyn Haber, MD  cloNIDine (CATAPRES-TTS-2) 0.2 mg/24hr patch Place 1 patch (0.2 mg total) onto the skin once a week. 02/03/14   Robyn Haber, MD  fluticasone (FLONASE) 50 MCG/ACT nasal spray Place 2 sprays into both nostrils daily. 06/01/14   Thao P Le, DO  furosemide (LASIX) 20 MG tablet Take 20 mg by mouth daily.    Historical Provider, MD  hydrOXYzine (VISTARIL) 100 MG capsule Take 100 mg by mouth daily as needed (allergies.).  Historical Provider, MD  losartan-hydrochlorothiazide (HYZAAR) 100-12.5 MG per tablet Take 1 tablet by mouth daily. 04/21/14   Robyn Haber, MD  metoprolol succinate (TOPROL-XL) 25 MG 24 hr tablet Take 1 tablet (25 mg total) by mouth daily. 06/01/14   Thao P Le, DO  oxyCODONE-acetaminophen (PERCOCET/ROXICET) 5-325 MG per tablet Take 1 tablet by mouth every 4 (four) hours as needed for moderate pain or  severe pain. 07/12/14   Hanley Hays, PA  penicillin v potassium (VEETID) 500 MG tablet Take 1 tablet (500 mg total) by mouth 4 (four) times daily. 07/12/14 07/19/14  Verda Cumins Jaslin Novitski, PA  tamsulosin (FLOMAX) 0.4 MG CAPS capsule Take 1 capsule (0.4 mg total) by mouth daily. 06/01/14   Thao P Le, DO   BP 147/110 mmHg  Pulse 70  Temp(Src) 97.8 F (36.6 C) (Oral)  Resp 20  SpO2 100% Physical Exam  Constitutional: He is oriented to person, place, and time. He appears well-developed and well-nourished. No distress.  HENT:  Head: Normocephalic and atraumatic.  Right Ear: External ear normal.  Left Ear: External ear normal.  Mouth/Throat: Oropharynx is clear and moist. No oropharyngeal exudate.  Patient reports pain to his right upper first molar. No evidence of a cracked tooth. No erythema, induration, abscess, or drainage. Tenderness to palpation on his right upper first molar. TMs are pearly gray and no loss of landmarks.  Eyes: Conjunctivae and EOM are normal.  Patient has a left prosthetic eye. Right eye is equal, round and reactive to light.  Neck: Normal range of motion. Neck supple. No tracheal deviation present.  Mild reactive submandibular lymphadenopathy. No cervical lymphadenopathy.  Cardiovascular: Normal rate, regular rhythm, normal heart sounds and intact distal pulses.  Exam reveals no gallop and no friction rub.   No murmur heard. Pulmonary/Chest: Effort normal and breath sounds normal. No respiratory distress. He has no wheezes. He has no rales.  Abdominal: Soft. There is no tenderness.  Musculoskeletal: Normal range of motion. He exhibits no edema.  Neurological: He is alert and oriented to person, place, and time. No cranial nerve deficit.  Cranial nerves II through XII intact bilaterally.  Skin: Skin is warm and dry. No rash noted. He is not diaphoretic.  Psychiatric: He has a normal mood and affect. His behavior is normal.  Nursing note and vitals  reviewed.   ED Course  Procedures (including critical care time)  DIAGNOSTIC STUDIES: Oxygen Saturation is 100% on RA, normal by my interpretation.    COORDINATION OF CARE:  5:43 PM Will prescribe antibiotics and pain medication.  Advised patient to follow-up with dentist.  Patient acknowledges and agrees with plan.    Labs Review Labs Reviewed - No data to display  Imaging Review No results found.   EKG Interpretation None      Filed Vitals:   07/12/14 1728 07/12/14 1745 07/12/14 1933  BP: 182/117 178/119 147/110  Pulse: 77  70  Temp: 97.8 F (36.6 C)    TempSrc: Oral    Resp: 18  20  SpO2: 100%  100%     MDM   Meds given in ED:  Medications  oxyCODONE-acetaminophen (PERCOCET/ROXICET) 5-325 MG per tablet 2 tablet (2 tablets Oral Given 07/12/14 1837)  penicillin v potassium (VEETID) tablet 500 mg (500 mg Oral Given 07/12/14 1932)    New Prescriptions   OXYCODONE-ACETAMINOPHEN (PERCOCET/ROXICET) 5-325 MG PER TABLET    Take 1 tablet by mouth every 4 (four) hours as needed for moderate pain or severe pain.  PENICILLIN V POTASSIUM (VEETID) 500 MG TABLET    Take 1 tablet (500 mg total) by mouth 4 (four) times daily.    Final diagnoses:  Pain, dental   GERSON FAUTH is a 48 y.o. male with a history of hypertension and poor dentition who presents to the ED with an day history of right upper first molar dental pain. Patient is afebrile. He has no cranial nerve deficits. He denies headache or changes to his vision. He has no sign of dental abscess and has no induration, erythema or drainage. His blood pressure was elevated in the ED possibly somewhat due to his pain. His blood pressure did lower some with improved pain control. At time of discharge he rates his pain at 2 out of 10. Patient was given his first dose of penicillin VK in the ED. Advised patient to start taking the medication tomorrow morning. Advised to use caution when taking Percocet for pain. Advised  patient to follow-up with on-call dentist as soon as possible. Advised patient to return to the ED with new or worsening symptoms or new concerns. They should verbalize understanding and agreement with plan. Patient discussed with PA Kirichenko who agrees with assessment and plan.   I personally performed the services described in this documentation, which was scribed in my presence. The recorded information has been reviewed and is accurate.    Verda Cumins [place], [place] 07/12/14 2011  Dorie Rank, MD 07/12/14 2031

## 2014-07-12 NOTE — Discharge Instructions (Signed)
Dental Pain °A tooth ache may be caused by cavities (tooth decay). Cavities expose the nerve of the tooth to air and hot or cold temperatures. It may come from an infection or abscess (also called a boil or furuncle) around your tooth. It is also often caused by dental caries (tooth decay). This causes the pain you are having. °DIAGNOSIS  °Your caregiver can diagnose this problem by exam. °TREATMENT  °· If caused by an infection, it may be treated with medications which kill germs (antibiotics) and pain medications as prescribed by your caregiver. Take medications as directed. °· Only take over-the-counter or prescription medicines for pain, discomfort, or fever as directed by your caregiver. °· Whether the tooth ache today is caused by infection or dental disease, you should see your dentist as soon as possible for further care. °SEEK MEDICAL CARE IF: °The exam and treatment you received today has been provided on an emergency basis only. This is not a substitute for complete medical or dental care. If your problem worsens or new problems (symptoms) appear, and you are unable to meet with your dentist, call or return to this location. °SEEK IMMEDIATE MEDICAL CARE IF:  °· You have a fever. °· You develop redness and swelling of your face, jaw, or neck. °· You are unable to open your mouth. °· You have severe pain uncontrolled by pain medicine. °MAKE SURE YOU:  °· Understand these instructions. °· Will watch your condition. °· Will get help right away if you are not doing well or get worse. °Document Released: 08/23/2005 Document Revised: 11/15/2011 Document Reviewed: 04/10/2008 °ExitCare® Patient Information ©2015 [employer]. This information is not intended to replace advice given to you by your health care provider. Make sure you discuss any questions you have with your health care provider. ° °Emergency Department Resource Guide °1) Find a Doctor and Pay Out of Pocket °Although you won't have to find out who  is covered by your insurance plan, it is a good idea to ask around and get recommendations. You will then need to call the office and see if the doctor you have chosen will accept you as a new patient and what types of options they offer for patients who are self-pay. Some doctors offer discounts or will set up payment plans for their patients who do not have insurance, but you will need to ask so you aren't surprised when you get to your appointment. ° °2) Contact Your Local Health Department °Not all health departments have doctors that can see patients for sick visits, but many do, so it is worth a call to see if yours does. If you don't know where your local health department is, you can check in your phone book. The CDC also has a tool to help you locate your state's health department, and many state websites also have listings of all of their local health departments. ° °3) Find a Walk-in Clinic °If your illness is not likely to be very severe or complicated, you may want to try a walk in clinic. These are popping up all over the country in pharmacies, drugstores, and shopping centers. They're usually staffed by nurse practitioners or physician assistants that have been trained to treat common illnesses and complaints. They're usually fairly quick and inexpensive. However, if you have serious medical issues or chronic medical problems, these are probably not your best option. ° °No Primary Care Doctor: °- Call [facility] at  832-8000 - they can help you locate a primary   care doctor that  accepts your insurance, provides certain services, etc. °- Physician Referral Service- 1-800-533-3463 ° °Chronic Pain Problems: °Organization         Address  Phone   Notes  °[facility]  (336) 297-2271 Patients need to be referred by their primary care doctor.  ° °Medication Assistance: °Organization         Address  Phone   Notes  °[place] [facility] [address]  [address] °[place], [place] [address] (336) 641-8030 --Must be a resident of [place] °-- Must have NO insurance coverage whatsoever (no Medicaid/ Medicare, etc.) °-- The pt. MUST have a primary care doctor that directs their care regularly and follows them in the community °  °[facility]  (866) 331-1348   °[employer]  (888) 892-1162   ° °Agencies that provide inexpensive medical care: °Organization         Address  Phone   Notes  °[facility]  (336) 832-8035   °[facility]    (336) 832-7272   °[facility] [address] °[place], [place] [address] (336) 832-4777   °[facility] [address], °[place] (336) 271-4999   °[facility]    (336) 373-0678   °[facility]    (336) 272-1050   °[facility] ° [address], [place] Phone:  (336) 832-4444, Fax:  (336) 832-4440 Hours of Operation:  9 am - 6 pm, M-F.  Also accepts Medicaid/Medicare and self-pay.  °[facility] ° [address], [place] Phone: (336) 832-3150, Fax: (336) 832-3151. Hours of Operation:  8:30 am - 5:30 pm, M-F.  Also accepts Medicaid and self-pay.  °[facility] [place] [address], [place] Phone: (336) 878-6027   °[facility] [address], [place], [place] (336)723-1848, Ext. 123 Mondays & Thursdays: 7-9 AM.  First 15 patients are seen on a first come, first serve basis. °  ° °Medicaid-accepting [place] Providers: ° °Organization         Address  Phone   Notes  °[facility] [address], [place] (336) 641-2100 Also accepts self-pay patients.  °[facility] [address], [place] ° (336) 856-9996   °[facility] [address], [place] (336) 288-8857   °[facility] [address] [place] [address], [place] (336) 299-7000   °Veita Bland [address], [place]  ° (336) 373-1557 Only accepts [place] Access Medicaid patients after they have their name applied to their card.  ° °Self-Pay (no insurance) in [place]: ° °Organization         Address  Phone   Notes  °Sickle Cell Patients, [facility] [address], [place] (336) 832-1970   °[facility] [address], [place] (336) 832-4400   °[facility] [place] ° [address], [place] (336) 992-4800   °[facility]/Dr. Osei-Bonsu ° [address] [place] [address], [place] or [address], [place] (336) 841-8500 Phone number for both [place] and [place] locations is the same.  °[facility] [address], [place] (336) 299-0000   °[facility]  [address] [place] [address],  or [address] (336) 852-7530 °(336) 878-2260   °  [facility] [address], [place] (336) 350-1642, phone; (336) 294-5005, fax Sees patients 1st and 3rd Saturday of every month.  Must not qualify for public or private insurance (i.e. Medicaid, Medicare, [place] Health Choice, Veterans' Benefits) • Household income should be no more than 200% of the poverty level •The clinic cannot treat you if you are pregnant or think you are pregnant • Sexually transmitted diseases are not treated at the clinic.  ° ° °Dental Care: °Organization         Address  Phone  Notes  °[facility] [address], [place] (336) 641-6152 Accepts children up to age 21 who are enrolled in Medicaid or [place] Health Choice; pregnant women with a Medicaid card; and children who have applied for Medicaid or [place] Health Choice, but were declined, whose parents can pay a reduced fee at time of service.  °[facility] [place]  [address], [place] (336) 641-7733 Accepts children up to age 21 who are  enrolled in Medicaid or [place] Health Choice; pregnant women with a Medicaid card; and children who have applied for Medicaid or [place] Health Choice, but were declined, whose parents can pay a reduced fee at time of service.  °[facility] ° [address], [place] (336) 641-4533 Patients are seen by appointment only. Walk-ins are not accepted. [facility] will see patients 18 years of age and older. °Monday - Tuesday (8am-5pm) °Most Wednesdays (8:30-5pm) °$30 per visit, cash only  °[facility] ° [address], [place] (336) 641-4533 Patients are seen by appointment only. Walk-ins are not accepted. [facility] will see patients 18 years of age and older. °One Wednesday Evening (Monthly: Volunteer Based).  $30 per visit, cash only  °[facility]  (919) 537-3737 for adults; Children under age 4, call Graduate Pediatric Dentistry at (919) [phone]. Children aged 4-14, please call (919) [phone] to request a pediatric application. ° Dental services are provided in all areas of dental care including fillings, crowns and bridges, complete and partial dentures, implants, gum treatment, root canals, and extractions. Preventive care is also provided. Treatment is provided to both adults and children. °Patients are selected via a lottery and there is often a waiting list. °  °[facility] [address], °[place] ° (336) 763-8833 [email or web address] °  °[facility] [address], [place], [place] (336)723-1848, Ext. 123 Second and Fourth Thursday of each month, opens at 6:30 AM; Clinic ends at 9 AM.  Patients are seen on a first-come first-served basis, and a limited number are seen during each clinic.  ° °[facility] ° [address], [place], [place] (336) 723-7904   Eligibility Requirements °You must have lived in [place], [place], or [place] counties for at least the last three months. °  You  cannot be eligible for state or federal sponsored healthcare insurance, including Veterans Administration, Medicaid, or Medicare. °  You generally cannot be eligible for healthcare insurance through your employer.  °  How to apply: °Eligibility screenings are held every Tuesday and Wednesday afternoon from 1:00 pm until 4:00 pm. You do not need an appointment for the interview!  °[facility] [address], [place],  336-631-2330   °[facility] [facility]  336-342-8273   °[facility]  336-703-3100   °[facility]  336-570-6415   ° °Behavioral Health Resources in the Community: °Intensive Outpatient Programs °Organization         Address  Phone  Notes  °[facility] [address], [place], [place] 336-878-6098   °[facility] [address], [place] 336-832-9800   °[facility] [address], [place], [place] ° 336-882-2125   °[facility] [address],  °[place], [place] 1-800-853-5163 or 336-641-4981   °Substance Abuse Resources °Organization         Address  Phone  Notes  °[facility]  336-882-2125   °[facility]  336-784-9470   °[facility]  336-285-9073   °[facility]  336-845-3988   °Residential & Outpatient Substance Abuse Program  1-800-659-3381   °Psychological Services °Organization         Address  Phone  Notes  ° Health  336- 832-9600   °[facility]  336- 378-7881   °[facility] [address], [place] 1-800-853-5163 or 336-641-4981   ° °Mobile Crisis Teams °Organization         Address  Phone  Notes  °[facility], Mobile Crisis Care Unit  1-877-626-1772   °Assertive °[facility] ° [address] [place], [place] 336-834-9664   °Sharon DeEsch [address] °[place] 336-554-5454   ° °Self-Help/Support  Groups °Organization         Address  Phone             Notes  °[facility] - variety of support groups  336- 373-1402 Call for more information  °Narcotics Anonymous (NA), [facility] [address], °[place]  2 meetings at this location  ° °Residential Treatment Programs °Organization         Address  Phone  Notes  °[facility] [address],    °[place]  1-866-801-8205   °[facility] ° [address], [place], [place] 704-293-8524   °[facility] [address], [place] 336-845-3988 Admissions: 8am-3pm M-F  °[facility] [address]    °[place], [place] 336-841-1104   °[facility] [address], [place], [place] 336-379-7146   °[facility] [address]  °[place], [place] 336-285-9073   °[facility] - Intensive Outpatient [address], , [place] 336-852-3033   °[facility] [address]  °[place], [place] 1-877-615-2722 or 336-784-9470   °[facility] [address] [place], [place] 336-227-7417 Accepts Medicaid  °[facility] [address]  ° [place] 1-800-659-3381 Substance Abuse/Addiction Treatment  ° °[place] Behavioral Health Resources °Organization         Address  Phone  Notes  °[facility]  (888) 581-9988   °Julie Brannon, PhD [address] [place], [place]   (336) 349-5553 or (336) 951-0000   °[facility]   [address] °[place], [place] (336) 349-4454   °[facility] [address], [place],  (336) 342-8316 Insurance/Medicaid/sponsorship through [facility]  °[facility] [address]                                    [place],  (336) 342-8316 Therapy/tele-psych/case  °[facility]   [address]  ° [place], [place] (336) 349-2233    °Dr. Arfeen  (336) 349-4544   °[facility] [place]  [employer] [facility] [place] [facility] 1) [address], [place] °2) [address], [place] °3)  [address], [place] (336) 349-3220 °(336) 342-7768 ° °(336) 342-8140   °[place] [facility] (336) 342-1394 or (336) 342-3537 (After Hours)    ° ° ° °

## 2014-07-19 ENCOUNTER — Other Ambulatory Visit: Payer: Self-pay | Admitting: Family Medicine

## 2014-07-22 NOTE — Telephone Encounter (Signed)
Dr Marin Comment, do you want to give pt RFs?

## 2014-07-27 ENCOUNTER — Other Ambulatory Visit: Payer: Self-pay | Admitting: Family Medicine

## 2014-08-09 ENCOUNTER — Ambulatory Visit (INDEPENDENT_AMBULATORY_CARE_PROVIDER_SITE_OTHER): Payer: BC Managed Care – PPO | Admitting: Family Medicine

## 2014-08-09 VITALS — BP 158/104 | HR 76 | Temp 98.2°F | Resp 17 | Ht 70.5 in | Wt 206.0 lb

## 2014-08-09 DIAGNOSIS — I10 Essential (primary) hypertension: Secondary | ICD-10-CM

## 2014-08-09 DIAGNOSIS — K047 Periapical abscess without sinus: Secondary | ICD-10-CM

## 2014-08-09 MED ORDER — CLINDAMYCIN HCL 150 MG PO CAPS: 150.0000 mg | ORAL_CAPSULE | Freq: Three times a day (TID) | ORAL | Status: DC

## 2014-08-09 MED ORDER — METOPROLOL SUCCINATE ER 25 MG PO TB24: ORAL_TABLET | ORAL | Status: DC

## 2014-08-09 MED ORDER — CLONIDINE HCL 0.2 MG/24HR TD PTWK: 0.2000 mg | MEDICATED_PATCH | TRANSDERMAL | Status: DC

## 2014-08-09 MED ORDER — ADULT BLOOD PRESSURE CUFF LG KIT: PACK | Status: DC

## 2014-08-09 MED ORDER — LOSARTAN POTASSIUM-HCTZ 100-12.5 MG PO TABS: 1.0000 | ORAL_TABLET | Freq: Every day | ORAL | Status: DC

## 2014-08-09 NOTE — Patient Instructions (Signed)
Dylan Gonzalez, DDS for dental abscess

## 2014-08-09 NOTE — Progress Notes (Addendum)
Patient ID: Dylan Gonzalez, male   DOB: Jun 22, 1966, 48 y.o.   MRN: [id number]  This chart was scribed for Robyn Haber, MD by Ladene Artist, ED Scribe. The patient was seen in room 4. Patient's care was started at 3:43 PM.  Patient ID: Dylan Gonzalez MRN: [id number], DOB: 10-Apr-1966, 48 y.o. Date of Encounter: 08/09/2014, 3:43 PM  Primary Physician: Robyn Haber, MD   Chief Complaint  Patient presents with  . Hypertension    HPI: 48 y.o. year old male with history below presents with one episode of dizziness 2 days ago. Pt states that he was washing his hands when he experienced dizziness for 5-10 seconds. Pt states that the episode occurred around 4 PM.  HTN BP during examination: 160/100. Pt states that he occasionally checks his BP at work. He states that he had a BP cuff but has misplaced it. Pt also reports taking his medication daily as prescribed.   Dental Pain Pt reports constant L upper dental pain that he describes as pessure. He reports associated L-sided facial swelling over the past few days. Pt suspects that he has a dental abscess. Pt was seen in the ED on 07/12/14 for dental pain. Pt was prescribed Veetid and Percocet with temporary relief. Pt does not have a dentist that he sees regularly. Pt initially had tooth removed by Dr. Stefanie Libel.  Urinary  Pt reports that he is still taking Flomax. Pt has 2 pills left.   Past Medical History  Diagnosis Date  . Hypertension   . MI (myocardial infarction)     Home Meds: Prior to Admission medications   Medication Sig Start Date End Date Taking? Authorizing Provider  cloNIDine (CATAPRES-TTS-2) 0.2 mg/24hr patch Place 1 patch (0.2 mg total) onto the skin once a week. 02/03/14  Yes Robyn Haber, MD  fluticasone (FLONASE) 50 MCG/ACT nasal spray PLACE 2 SPRAYS INTO BOTH NOSTRILS DAILY. 07/22/14  Yes Thao P Le, DO  losartan-hydrochlorothiazide (HYZAAR) 100-12.5 MG per tablet Take 1 tablet by mouth daily. 04/21/14  Yes Robyn Haber, MD  metoprolol succinate (TOPROL-XL) 25 MG 24 hr tablet TAKE 1 TABLET BY MOUTH EVERY DAY.  "NEEDS OFFICE VISIT FOR ADDITIONAL VISIT" 07/28/14  Yes Mancel Bale, PA-C  oxyCODONE-acetaminophen (PERCOCET/ROXICET) 5-325 MG per tablet Take 1 tablet by mouth every 4 (four) hours as needed for moderate pain or severe pain. 07/12/14  Yes Verda Cumins Dansie, PA-C  tamsulosin (FLOMAX) 0.4 MG CAPS capsule TAKE ONE CAPSULE BY MOUTH EVERY DAY  "NEEDS OFFICE VISIT FOR ADDITIONAL REFILLS" 07/28/14  Yes Mancel Bale, PA-C  amLODipine (NORVASC) 5 MG tablet TAKE 1 TABLET BY MOUTH EVERY DAY FOR BLOOD PRESSURE Patient not taking: Reported on 08/09/2014 06/25/14   Robyn Haber, MD  furosemide (LASIX) 20 MG tablet Take 20 mg by mouth daily.    Historical Provider, MD  hydrOXYzine (VISTARIL) 100 MG capsule Take 100 mg by mouth daily as needed (allergies.).    Historical Provider, MD    Allergies: No Known Allergies  History   Social History  . Marital Status: Married    Spouse Name: [name]    Number of Children: [name]  . Years of Education: [name]   Occupational History  . Not on file.   Social History Main Topics  . Smoking status: Never Smoker   . Smokeless tobacco: Not on file  . Alcohol Use: No  . Drug Use: No  . Sexual Activity: Not Currently   Other Topics Concern  . Not on file  Social History Narrative    Review of Systems: Constitutional: negative for chills, fever, night sweats, weight changes, or fatigue  HEENT: negative for vision changes, hearing loss, congestion, rhinorrhea, ST, epistaxis, or sinus pressure, +facial swelling, +dental problem Cardiovascular: negative for chest pain or palpitations Respiratory: negative for hemoptysis, wheezing, shortness of breath, or cough Abdominal: negative for abdominal pain, nausea, vomiting, diarrhea, or constipation Dermatological: negative for rash Neurologic: negative for headache or syncope, +dizziness All other systems reviewed  and are otherwise negative with the exception to those above and in the HPI.  Physical Exam: Triage Vitals: Blood pressure 158/104, pulse 76, temperature 98.2 F (36.8 C), temperature source Oral, resp. rate 17, height 5' 10.5" (1.791 m), weight 206 lb (93.441 kg), SpO2 98 %., Body mass index is 29.13 kg/(m^2). BP during examination: 160/100 General: Well developed, well nourished, in no acute distress. 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 Bilateral auditory canals clear, TM's are without perforation, pearly grey and translucent with reflective cone of light bilaterally. Oral cavity moist, posterior pharynx without exudate, erythema, peritonsillar abscess, or post nasal drip. Swollen L upper gum next to tooth #13 which has retained tooth.   Neck: Supple. No thyromegaly. Full ROM. No lymphadenopathy. Lungs: Clear bilaterally to auscultation without wheezes, rales, or rhonchi. Breathing is unlabored. Heart: RRR with S1 S2. No murmurs, rubs, or gallops appreciated. Abdomen: Soft, non-tender, non-distended with normoactive bowel sounds. No hepatomegaly. No rebound/guarding. No obvious abdominal masses. Msk:  Strength and tone normal for age. Extremities/Skin: Warm and dry. No clubbing or cyanosis. No edema. No rashes or suspicious lesions. Neuro: Alert and oriented X 3. Moves all extremities spontaneously. Gait is normal. CNII-XII grossly in tact. Psych:  Responds to questions appropriately with a normal affect.   Labs:  ASSESSMENT AND PLAN:  48 y.o. year old male with  The primary encounter diagnosis was Essential hypertension, benign. Diagnoses of Accelerated hypertension and Dental abscess were also pertinent to this visit. Essential hypertension, benign - Plan: metoprolol succinate (TOPROL-XL) 25 MG 24 hr tablet, losartan-hydrochlorothiazide (HYZAAR) 100-12.5 MG per tablet, Blood Pressure  Monitoring (ADULT BLOOD PRESSURE CUFF LG) KIT  Accelerated hypertension - Plan: cloNIDine (CATAPRES-TTS-2) 0.2 mg/24hr patch  Dental abscess - Plan: clindamycin (CLEOCIN) 150 MG capsule   I personally performed the services described in this documentation, which was scribed in my presence. The recorded information has been reviewed and is accurate. This chart was scribed in my presence and reviewed by me personally.  Signed, Robyn Haber, MD 08/09/2014 3:43 PM

## 2014-08-14 ENCOUNTER — Ambulatory Visit (INDEPENDENT_AMBULATORY_CARE_PROVIDER_SITE_OTHER): Payer: BC Managed Care – PPO | Admitting: Family Medicine

## 2014-08-14 VITALS — BP 174/107 | HR 64 | Temp 98.0°F | Resp 16 | Ht 72.0 in | Wt 206.8 lb

## 2014-08-14 DIAGNOSIS — K047 Periapical abscess without sinus: Secondary | ICD-10-CM

## 2014-08-14 MED ORDER — AMOXICILLIN-POT CLAVULANATE 875-125 MG PO TABS: 1.0000 | ORAL_TABLET | Freq: Two times a day (BID) | ORAL | Status: DC

## 2014-08-14 MED ORDER — OXYCODONE-ACETAMINOPHEN 5-325 MG PO TABS: 1.0000 | ORAL_TABLET | ORAL | Status: DC | PRN

## 2014-08-14 NOTE — Patient Instructions (Signed)

## 2014-08-14 NOTE — Progress Notes (Addendum)
This chart was scribed for Dylan Haber, MD by Edison Simon, ED Scribe. This patient was seen in room 1.     Patient ID: Dylan Gonzalez MRN: [id number], DOB: 1966-08-05, 48 y.o. Date of Encounter: 08/14/2014, 8:34 PM  Primary Physician: Dylan Haber, MD  Chief Complaint: abscess recheck, hypertension  HPI: 48 y.o. year old male with history below presents for recheck for dental abscess to 28. He states he has been using Clindamycin as instructed, but is still having pain. He also reports damaging a tooth on his right side while eating recently. He states he has not contacted a dentist and needs to work on getting a dentist that is in his network. He states he has not been prescribed pain medication but has been using Tylenol; he states he has used Vicodin and Percocet in the past.  His blood is high today, but he states he has been doing well on his blood pressure medications.   Past Medical History  Diagnosis Date  . Hypertension   . MI (myocardial infarction)      Home Meds: Prior to Admission medications   Medication Sig Start Date End Date Taking? Authorizing Provider  amLODipine (NORVASC) 5 MG tablet TAKE 1 TABLET BY MOUTH EVERY DAY FOR BLOOD PRESSURE 06/25/14  Yes Dylan Haber, MD  Blood Pressure Monitoring (ADULT BLOOD PRESSURE CUFF LG) KIT Use daily 08/09/14  Yes Dylan Haber, MD  clindamycin (CLEOCIN) 150 MG capsule Take 1 capsule (150 mg total) by mouth 3 (three) times daily. 08/09/14  Yes Dylan Haber, MD  cloNIDine (CATAPRES-TTS-2) 0.2 mg/24hr patch Place 1 patch (0.2 mg total) onto the skin once a week. 08/09/14  Yes Dylan Haber, MD  fluticasone (FLONASE) 50 MCG/ACT nasal spray PLACE 2 SPRAYS INTO BOTH NOSTRILS DAILY. 07/22/14  Yes Thao P Le, DO  furosemide (LASIX) 20 MG tablet Take 20 mg by mouth daily.   Yes Historical Provider, MD  hydrOXYzine (VISTARIL) 100 MG capsule Take 100 mg by mouth daily as needed (allergies.).   Yes Historical Provider, MD    losartan-hydrochlorothiazide (HYZAAR) 100-12.5 MG per tablet Take 1 tablet by mouth daily. 08/09/14  Yes Dylan Haber, MD  metoprolol succinate (TOPROL-XL) 25 MG 24 hr tablet TAKE 1 TABLET BY MOUTH EVERY DAY.  "NEEDS OFFICE VISIT FOR ADDITIONAL VISIT" 08/09/14  Yes Dylan Haber, MD  oxyCODONE-acetaminophen (PERCOCET/ROXICET) 5-325 MG per tablet Take 1 tablet by mouth every 4 (four) hours as needed for moderate pain or severe pain. 07/12/14  Yes Verda Cumins Dansie, PA-C  tamsulosin (FLOMAX) 0.4 MG CAPS capsule TAKE ONE CAPSULE BY MOUTH EVERY DAY  "NEEDS OFFICE VISIT FOR ADDITIONAL REFILLS" 07/28/14  Yes Mancel Bale, PA-C    Allergies: No Known Allergies  History   Social History  . Marital Status: Married    Spouse Name: [name]    Number of Children: [name]  . Years of Education: [name]   Occupational History  . Not on file.   Social History Main Topics  . Smoking status: Never Smoker   . Smokeless tobacco: Not on file  . Alcohol Use: No  . Drug Use: No  . Sexual Activity: Not Currently   Other Topics Concern  . Not on file   Social History Narrative     Review of Systems: Constitutional: negative for chills, fever, night sweats, weight changes, or fatigue  HEENT: negative for vision changes, hearing loss, congestion, rhinorrhea, ST, epistaxis, or sinus pressure Cardiovascular: negative for chest pain or palpitations Respiratory: negative for hemoptysis, wheezing,  shortness of breath, or cough Abdominal: negative for abdominal pain, nausea, vomiting, diarrhea, or constipation Dermatological: negative for rash Neurologic: negative for headache, dizziness, or syncope All other systems reviewed and are otherwise negative with the exception to those above and in the HPI.   Physical Exam: Blood pressure 174/107, pulse 64, temperature 98 F (36.7 C), temperature source Oral, resp. rate 16, height 6' (1.829 m), weight 206 lb 12.8 oz (93.804 kg), SpO2 99 %., Body mass index is  28.04 kg/(m^2). General: Well developed, well nourished, in no acute distress. Head: Normocephalic, atraumatic, eyes without discharge, sclera non-icteric, nares are without discharge. Bilateral auditory canals clear, TM's are without perforation, pearly grey and translucent with reflective cone of light bilaterally. Oral cavity moist, posterior pharynx without exudate, erythema, peritonsillar abscess, or post nasal drip.  Neck: Supple. No thyromegaly. Full ROM. No lymphadenopathy. Lungs: Clear bilaterally to auscultation without wheezes, rales, or rhonchi. Breathing is unlabored. Heart: RRR with S1 S2. No murmurs, rubs, or gallops appreciated. Abdomen: Soft, non-tender, non-distended with normoactive bowel sounds. No hepatomegaly. No rebound/guarding. No obvious abdominal masses. Msk:  Strength and tone normal for age. Extremities/Skin: Warm and dry. No clubbing or cyanosis. No edema. No rashes or suspicious lesions. Neuro: Alert and oriented X 3. Moves all extremities spontaneously. Gait is normal. CNII-XII grossly in tact. Psych:  Responds to questions appropriately with a normal affect.  Obvious swelling tooth #13, broken tooth #4   ASSESSMENT AND PLAN:  This chart was scribed in my presence and reviewed by me personally.    ICD-9-CM ICD-10-CM   1. Dental abscess 522.5 K04.7 oxyCODONE-acetaminophen (PERCOCET/ROXICET) 5-325 MG per tablet     amoxicillin-clavulanate (AUGMENTIN) 875-125 MG per tablet  Dental abscess - Plan: oxyCODONE-acetaminophen (PERCOCET/ROXICET) 5-325 MG per tablet, amoxicillin-clavulanate (AUGMENTIN) 875-125 MG per tablet      ICD-9-CM ICD-10-CM   1. Dental abscess 522.5 K04.7 oxyCODONE-acetaminophen (PERCOCET/ROXICET) 5-325 MG per tablet     amoxicillin-clavulanate (AUGMENTIN) 875-125 MG per tablet     Signed, Dylan Haber, MD   Signed, Dylan Haber, MD   This chart was scribed in my presence and reviewed by me personally.    ICD-9-CM ICD-10-CM     1. Dental abscess 522.5 K04.7 oxyCODONE-acetaminophen (PERCOCET/ROXICET) 5-325 MG per tablet     amoxicillin-clavulanate (AUGMENTIN) 875-125 MG per tablet     Signed, Dylan Haber, MD    Signed, Dylan Haber, MD 08/14/2014 8:34 PM

## 2014-11-29 ENCOUNTER — Emergency Department (HOSPITAL_COMMUNITY)
Admission: EM | Admit: 2014-11-29 | Discharge: 2014-11-29 | Disposition: A | Discharge status: Home / Self Care | Payer: BLUE CROSS/BLUE SHIELD | Attending: Emergency Medicine | Admitting: Emergency Medicine

## 2014-11-29 ENCOUNTER — Encounter (HOSPITAL_COMMUNITY): Payer: Self-pay | Admitting: Emergency Medicine

## 2014-11-29 DIAGNOSIS — Z792 Long term (current) use of antibiotics: Secondary | ICD-10-CM | POA: Insufficient documentation

## 2014-11-29 DIAGNOSIS — I10 Essential (primary) hypertension: Secondary | ICD-10-CM | POA: Diagnosis not present

## 2014-11-29 DIAGNOSIS — Z7952 Long term (current) use of systemic steroids: Secondary | ICD-10-CM | POA: Insufficient documentation

## 2014-11-29 DIAGNOSIS — K047 Periapical abscess without sinus: Secondary | ICD-10-CM | POA: Insufficient documentation

## 2014-11-29 DIAGNOSIS — K0889 Other specified disorders of teeth and supporting structures: Secondary | ICD-10-CM

## 2014-11-29 DIAGNOSIS — I252 Old myocardial infarction: Secondary | ICD-10-CM | POA: Insufficient documentation

## 2014-11-29 DIAGNOSIS — Z79899 Other long term (current) drug therapy: Secondary | ICD-10-CM | POA: Insufficient documentation

## 2014-11-29 DIAGNOSIS — K088 Other specified disorders of teeth and supporting structures: Secondary | ICD-10-CM | POA: Insufficient documentation

## 2014-11-29 MED ORDER — CLINDAMYCIN HCL 300 MG PO CAPS: 300.0000 mg | ORAL_CAPSULE | Freq: Four times a day (QID) | ORAL | Status: DC

## 2014-11-29 MED ORDER — OXYCODONE-ACETAMINOPHEN 5-325 MG PO TABS
2.0000 | ORAL_TABLET | Freq: Once | ORAL | Status: AC
  Administered 2014-11-29: 2 via ORAL
  Filled 2014-11-29: qty 2

## 2014-11-29 MED ORDER — HYDROCODONE-ACETAMINOPHEN 5-325 MG PO TABS: 1.0000 | ORAL_TABLET | ORAL | Status: DC | PRN

## 2014-11-29 MED ORDER — METOPROLOL TARTRATE 25 MG PO TABS
25.0000 mg | ORAL_TABLET | Freq: Once | ORAL | Status: AC
  Administered 2014-11-29: 25 mg via ORAL
  Filled 2014-11-29: qty 1

## 2014-11-29 NOTE — ED Notes (Signed)
Patient complains of dental pain the the left lower and upper tooth that started x3 days ago, worse today.

## 2014-11-29 NOTE — ED Provider Notes (Signed)
CSN: [id number]     Arrival date & time 11/29/14  1425 History  This chart was scribed for non-physician practitioner, Carman Ching, PA-C working with Davonna Belling, MD by Tula Nakayama, ED scribe. This patient was seen in room WTR5/WTR5 and the patient's care was started at 3:09 PM   Chief Complaint  Patient presents with  . Dental Pain   The history is provided by the patient. No language interpreter was used.    HPI Comments: Dylan Gonzalez is a 49 y.o. male with a history of HTN and MI who presents to the Emergency Department complaining of constant, gradually worsening left-sided, upper and lower dental pain that started 2 weeks ago and became worse today. Pt states subjective fever and gingival swelling as associated symptoms. He denies drainage.  No dentist, however was given the name by a co-worker to try to set up an appointment.  Past Medical History  Diagnosis Date  . Hypertension   . MI (myocardial infarction)    Past Surgical History  Procedure Laterality Date  . Hernia repair    . Tooth extraction Right 04/21/2013    Procedure: EXTRACTION MOLARS;  Surgeon: [name], DDS;  Location: [facility];  Service: Oral Surgery;  Laterality: Right;  . Eye removed    . Intraocular prostheses insertion     Family History  Problem Relation Age of Onset  . Hypertension Father   . Depression Brother    History  Substance Use Topics  . Smoking status: Never Smoker   . Smokeless tobacco: Not on file  . Alcohol Use: No    Review of Systems  Constitutional: Positive for fever.  HENT: Positive for dental problem.   All other systems reviewed and are negative.   Allergies  Pollen extract  Home Medications   Prior to Admission medications   Medication Sig Start Date End Date Taking? Authorizing Provider  Blood Pressure Monitoring (ADULT BLOOD PRESSURE CUFF LG) KIT Use daily 08/09/14  Yes Dillyn Joaquin Haber, MD  fluticasone (FLONASE) 50 MCG/ACT nasal spray PLACE 2 SPRAYS  INTO BOTH NOSTRILS DAILY. Patient taking differently: PLACE 2 SPRAYS INTO BOTH NOSTRILS DAILY PRN 07/22/14  Yes Thao P Le, DO  hydrOXYzine (VISTARIL) 100 MG capsule Take 100 mg by mouth daily as needed (allergies.).   Yes Historical Provider, MD  losartan-hydrochlorothiazide (HYZAAR) 100-12.5 MG per tablet Take 1 tablet by mouth daily. 08/09/14  Yes Simran Mannis Haber, MD  metoprolol succinate (TOPROL-XL) 25 MG 24 hr tablet TAKE 1 TABLET BY MOUTH EVERY DAY.  "NEEDS OFFICE VISIT FOR ADDITIONAL VISIT" 08/09/14  Yes Davie Claud Haber, MD  amLODipine (NORVASC) 5 MG tablet TAKE 1 TABLET BY MOUTH EVERY DAY FOR BLOOD PRESSURE Patient not taking: Reported on 11/29/2014 06/25/14   Berenis Corter Haber, MD  amoxicillin-clavulanate (AUGMENTIN) 875-125 MG per tablet Take 1 tablet by mouth 2 (two) times daily. Patient not taking: Reported on 11/29/2014 08/14/14   Yailyn Strack Haber, MD  clindamycin (CLEOCIN) 300 MG capsule Take 1 capsule (300 mg total) by mouth 4 (four) times daily. X 7 days 11/29/14   Carman Ching, PA-C  cloNIDine (CATAPRES-TTS-2) 0.2 mg/24hr patch Place 1 patch (0.2 mg total) onto the skin once a week. 08/09/14   Broderic Bara Haber, MD  HYDROcodone-acetaminophen (NORCO/VICODIN) 5-325 MG per tablet Take 1-2 tablets by mouth every 4 (four) hours as needed. 11/29/14   Carman Ching, PA-C  oxyCODONE-acetaminophen (PERCOCET/ROXICET) 5-325 MG per tablet Take 1 tablet by mouth every 4 (four) hours as needed for moderate  pain or severe pain. Patient not taking: Reported on 11/29/2014 08/14/14   Aleighna Wojtas Haber, MD  tamsulosin (FLOMAX) 0.4 MG CAPS capsule TAKE ONE CAPSULE BY MOUTH EVERY DAY  "NEEDS OFFICE VISIT FOR ADDITIONAL REFILLS" Patient not taking: Reported on 11/29/2014 07/28/14   Mancel Bale, PA-C   BP 150/115 mmHg  Pulse 64  Temp(Src) 99 F (37.2 C) (Oral)  Resp 16  SpO2 98% Physical Exam  Constitutional: He is oriented to person, place, and time. He appears well-developed and well-nourished. No distress.   HENT:  Head: Normocephalic and atraumatic.  Mouth/Throat: Uvula is midline. Abnormal dentition.  Tenderness and gingival inflammation on lower left lateral incisor and canine along with left upper incisor, canine and first premolar. Area of fluctuance palpated from outer aspect of mouth, unable to visualize inside oral mucosa. No outer erythema, pustules or swelling. Swallows secretions well.  Eyes: Conjunctivae and EOM are normal.  Neck: Normal range of motion. Neck supple.  Cardiovascular: Normal rate, regular rhythm and normal heart sounds.   Pulmonary/Chest: Effort normal and breath sounds normal.  Musculoskeletal: Normal range of motion. He exhibits no edema.  Neurological: He is alert and oriented to person, place, and time.  Skin: Skin is warm and dry.  Psychiatric: He has a normal mood and affect. His behavior is normal.  Nursing note and vitals reviewed.   ED Course  Procedures   DIAGNOSTIC STUDIES: Oxygen Saturation is 98% on RA, normal by my interpretation.    COORDINATION OF CARE: 3:11 PM Discussed treatment plan with pt which includes Clindamycin. Advised pt to follow up with a dentist. He agreed to plan.   Labs Review Labs Reviewed - No data to display  Imaging Review No results found.   EKG Interpretation None      MDM   Final diagnoses:  Dental abscess  Pain, dental  Dental infection   Nontoxic appearing, NAD. Afebrile. Swallow secretions well. No visible abscess to drain. No trismus. Rx clindamycin and pain control. Resources given for dental follow-up in the event that he cannot get in with the dentist he was referred to prior. Regarding high blood pressure, he did take his medications today. Denies chest pain, shortness of breath, headache, vision change. EKG without acute findings. Asymptomatic hypertension. I discussed importance of following up with his PCP. Stable for discharge. Return precautions given. Patient states understanding of treatment  care plan and is agreeable.  I personally performed the services described in this documentation, which was scribed in my presence. The recorded information has been reviewed and is accurate.   Carman Ching, PA-C 11/29/14 1709  Davonna Belling, MD 11/29/14 [phone]

## 2014-11-29 NOTE — Discharge Instructions (Signed)
Take clindamycin as directed for 1 week. It is very important for you to complete the entire course of antibiotics. Follow-up with the dentist. Take Vicodin for severe pain only. No driving or operating heavy machinery while taking vicodin. This medication may cause drowsiness.  Dental Abscess A dental abscess is a collection of infected fluid (pus) from a bacterial infection in the inner part of the tooth (pulp). It usually occurs at the end of the tooth's root.  CAUSES   Severe tooth decay.  Trauma to the tooth that allows bacteria to enter into the pulp, such as a broken or chipped tooth. SYMPTOMS   Severe pain in and around the infected tooth.  Swelling and redness around the abscessed tooth or in the mouth or face.  Tenderness.  Pus drainage.  Bad breath.  Bitter taste in the mouth.  Difficulty swallowing.  Difficulty opening the mouth.  Nausea.  Vomiting.  Chills.  Swollen neck glands. DIAGNOSIS   A medical and dental history will be taken.  An examination will be performed by tapping on the abscessed tooth.  X-rays may be taken of the tooth to identify the abscess. TREATMENT The goal of treatment is to eliminate the infection. You may be prescribed antibiotic medicine to stop the infection from spreading. A root canal may be performed to save the tooth. If the tooth cannot be saved, it may be pulled (extracted) and the abscess may be drained.  HOME CARE INSTRUCTIONS  Only take over-the-counter or prescription medicines for pain, fever, or discomfort as directed by your caregiver.  Rinse your mouth (gargle) often with salt water ( tsp salt in 8 oz [250 ml] of warm water) to relieve pain or swelling.  Do not drive after taking pain medicine (narcotics).  Do not apply heat to the outside of your face.  Return to your dentist for further treatment as directed. SEEK MEDICAL CARE IF:  Your pain is not helped by medicine.  Your pain is getting worse instead  of better. SEEK IMMEDIATE MEDICAL CARE IF:  You have a fever or persistent symptoms for more than 2-3 days.  You have a fever and your symptoms suddenly get worse.  You have chills or a very bad headache.  You have problems breathing or swallowing.  You have trouble opening your mouth.  You have swelling in the neck or around the eye. Document Released: 08/23/2005 Document Revised: 05/17/2012 Document Reviewed: 12/01/2010 [facility] Patient Information 2015 [place], [place]. This information is not intended to replace advice given to you by your health care provider. Make sure you discuss any questions you have with your health care provider.

## 2014-12-11 ENCOUNTER — Other Ambulatory Visit: Payer: Self-pay | Admitting: Family Medicine

## 2015-01-08 ENCOUNTER — Encounter (HOSPITAL_COMMUNITY): Payer: Self-pay | Admitting: Emergency Medicine

## 2015-01-08 ENCOUNTER — Emergency Department (HOSPITAL_COMMUNITY)
Admission: EM | Admit: 2015-01-08 | Discharge: 2015-01-09 | Disposition: A | Discharge status: Home / Self Care | Payer: BLUE CROSS/BLUE SHIELD | Attending: Emergency Medicine | Admitting: Emergency Medicine

## 2015-01-08 DIAGNOSIS — I10 Essential (primary) hypertension: Secondary | ICD-10-CM | POA: Insufficient documentation

## 2015-01-08 DIAGNOSIS — Z79899 Other long term (current) drug therapy: Secondary | ICD-10-CM | POA: Insufficient documentation

## 2015-01-08 DIAGNOSIS — I252 Old myocardial infarction: Secondary | ICD-10-CM | POA: Insufficient documentation

## 2015-01-08 DIAGNOSIS — R51 Headache: Secondary | ICD-10-CM | POA: Insufficient documentation

## 2015-01-08 NOTE — ED Notes (Addendum)
Pt transported from home by EMS with c/o onset of HA and dizziness @ 2130. 194/104 at home. Compliant with meds Pt takes all antihypertensives in the AM. Pt denies CP, denies n/v/d. Pt states he continues to be be slightly dizzy. Pt is currently dealing with infected tooth, has finished antibiotics. Pt does not take ASA daily any longer.

## 2015-01-08 NOTE — ED Notes (Signed)
Bed: WA20 Expected date:  Expected time:  Means of arrival:  Comments: EMS hypertension and dizziness

## 2015-01-09 LAB — BASIC METABOLIC PANEL
Anion gap: 4 — ABNORMAL LOW (ref 5–15)
BUN: 19 mg/dL (ref 6–20)
CALCIUM: 8.5 mg/dL — AB (ref 8.9–10.3)
CO2: 30 mmol/L (ref 22–32)
Chloride: 106 mmol/L (ref 101–111)
Creatinine, Ser: 1.14 mg/dL (ref 0.61–1.24)
Glucose, Bld: 103 mg/dL — ABNORMAL HIGH (ref 70–99)
POTASSIUM: 3.4 mmol/L — AB (ref 3.5–5.1)
Sodium: 140 mmol/L (ref 135–145)

## 2015-01-09 LAB — CBC WITH DIFFERENTIAL/PLATELET
BASOS ABS: 0 10*3/uL (ref 0.0–0.1)
Basophils Relative: 1 % (ref 0–1)
EOS ABS: 0.2 10*3/uL (ref 0.0–0.7)
Eosinophils Relative: 4 % (ref 0–5)
HEMATOCRIT: 39 % (ref 39.0–52.0)
Hemoglobin: 12.5 g/dL — ABNORMAL LOW (ref 13.0–17.0)
LYMPHS PCT: 47 % — AB (ref 12–46)
Lymphs Abs: 2.6 10*3/uL (ref 0.7–4.0)
MCH: 30.5 pg (ref 26.0–34.0)
MCHC: 32.1 g/dL (ref 30.0–36.0)
MCV: 95.1 fL (ref 78.0–100.0)
MONOS PCT: 9 % (ref 3–12)
Monocytes Absolute: 0.5 10*3/uL (ref 0.1–1.0)
Neutro Abs: 2.2 10*3/uL (ref 1.7–7.7)
Neutrophils Relative %: 39 % — ABNORMAL LOW (ref 43–77)
Platelets: 185 10*3/uL (ref 150–400)
RBC: 4.1 MIL/uL — AB (ref 4.22–5.81)
RDW: 12.5 % (ref 11.5–15.5)
WBC: 5.5 10*3/uL (ref 4.0–10.5)

## 2015-01-09 LAB — URINALYSIS, ROUTINE W REFLEX MICROSCOPIC
Bilirubin Urine: NEGATIVE
GLUCOSE, UA: NEGATIVE mg/dL
HGB URINE DIPSTICK: NEGATIVE
KETONES UR: NEGATIVE mg/dL
Leukocytes, UA: NEGATIVE
Nitrite: NEGATIVE
Protein, ur: NEGATIVE mg/dL
Specific Gravity, Urine: 1.016 (ref 1.005–1.030)
UROBILINOGEN UA: 0.2 mg/dL (ref 0.0–1.0)
pH: 6.5 (ref 5.0–8.0)

## 2015-01-09 LAB — I-STAT TROPONIN, ED: Troponin i, poc: 0 ng/mL (ref 0.00–0.08)

## 2015-01-09 MED ORDER — KETOROLAC TROMETHAMINE 30 MG/ML IJ SOLN
30.0000 mg | Freq: Once | INTRAMUSCULAR | Status: AC
  Administered 2015-01-09: 30 mg via INTRAVENOUS
  Filled 2015-01-09: qty 1

## 2015-01-09 MED ORDER — CLONIDINE HCL 0.1 MG PO TABS
0.1000 mg | ORAL_TABLET | ORAL | Status: AC
  Administered 2015-01-09: 0.1 mg via ORAL
  Filled 2015-01-09: qty 1

## 2015-01-09 MED ORDER — PROCHLORPERAZINE EDISYLATE 5 MG/ML IJ SOLN
10.0000 mg | Freq: Once | INTRAMUSCULAR | Status: AC
  Administered 2015-01-09: 10 mg via INTRAVENOUS
  Filled 2015-01-09: qty 2

## 2015-01-09 MED ORDER — DIPHENHYDRAMINE HCL 50 MG/ML IJ SOLN
25.0000 mg | Freq: Once | INTRAMUSCULAR | Status: AC
  Administered 2015-01-09: 25 mg via INTRAVENOUS
  Filled 2015-01-09: qty 1

## 2015-01-09 MED ORDER — SODIUM CHLORIDE 0.9 % IV BOLUS (SEPSIS)
1000.0000 mL | INTRAVENOUS | Status: AC
  Administered 2015-01-09: 1000 mL via INTRAVENOUS

## 2015-01-09 NOTE — Discharge Instructions (Signed)
Please follow the directions provided. Be sure to follow-up with your primary care doctor to discuss your recent high blood pressure numbers. Be sure to take all medications as directed. Don't hesitate to return for any chest pain, shortness of breath changes in vision or weakness or any other new, worsening, or concerning symptoms.   SEEK IMMEDIATE MEDICAL CARE IF:  You develop a severe headache or confusion.  You have unusual weakness, numbness, or feel faint.  You have severe chest or abdominal pain.  You vomit repeatedly.  You have trouble breathing.

## 2015-01-09 NOTE — ED Notes (Signed)
Patient verbalizes understanding of discharge instructions, home care and follow up care. Patient ambulatory out of department at this time.

## 2015-01-09 NOTE — ED Provider Notes (Signed)
CSN: [id number]     Arrival date & time 01/08/15  2303 History   First MD Initiated Contact with Patient 01/09/15 0136     Chief Complaint  Patient presents with  . Hypertension   (Consider location/radiation/quality/duration/timing/severity/associated sxs/prior Treatment) HPI  Dylan Gonzalez is a 49 -year-old male presenting with report of high blood pressure. He states he checks his blood pressure twice a day and he normally runs 140/90. He takes 3 different blood pressure medicines and he has been adherent to his medication regimen. He reports today, he began having a gradual onset headache around 8:30 PM the headache progressively worsened and so he checked his blood pressure was elevated to 190/105. He continues to have a frontal headache that has not improved. He has a history of a prostatic left eye, but has not noticed any visual changes in his right eye. He denies any fevers, neck stiffness, focal weakness or numbness.   Past Medical History  Diagnosis Date  . Hypertension   . MI (myocardial infarction)    Past Surgical History  Procedure Laterality Date  . Hernia repair    . Tooth extraction Right 04/21/2013    Procedure: EXTRACTION MOLARS;  Surgeon: [name], DDS;  Location: [facility];  Service: Oral Surgery;  Laterality: Right;  . Eye removed    . Intraocular prostheses insertion     Family History  Problem Relation Age of Onset  . Hypertension Father   . Depression Brother    History  Substance Use Topics  . Smoking status: Never Smoker   . Smokeless tobacco: Not on file  . Alcohol Use: No    Review of Systems  Constitutional: Negative for fever and chills.  HENT: Negative for sore throat.   Eyes: Negative for visual disturbance.  Respiratory: Negative for cough and shortness of breath.   Cardiovascular: Negative for chest pain and leg swelling.  Gastrointestinal: Negative for nausea, vomiting and diarrhea.  Genitourinary: Negative for dysuria.   Musculoskeletal: Negative for myalgias.  Skin: Negative for rash.  Neurological: Positive for headaches. Negative for weakness and numbness.      Allergies  Pollen extract  Home Medications   Prior to Admission medications   Medication Sig Start Date End Date Taking? Authorizing Provider  cloNIDine (CATAPRES-TTS-2) 0.2 mg/24hr patch Place 1 patch (0.2 mg total) onto the skin once a week. 08/09/14  Yes Robyn Haber, MD  fluticasone (FLONASE) 50 MCG/ACT nasal spray PLACE 2 SPRAYS INTO BOTH NOSTRILS DAILY. Patient taking differently: PLACE 2 SPRAYS INTO BOTH NOSTRILS DAILY as needed for allergies 07/22/14  Yes Thao P Le, DO  hydrOXYzine (VISTARIL) 100 MG capsule TAKE ONE CAPSULE BY MOUTH AT BEDTIME AS NEEDED FOR ALLERGY 12/12/14  Yes Robyn Haber, MD  losartan-hydrochlorothiazide (HYZAAR) 100-12.5 MG per tablet Take 1 tablet by mouth daily. 08/09/14  Yes Robyn Haber, MD  metoprolol succinate (TOPROL-XL) 25 MG 24 hr tablet TAKE 1 TABLET BY MOUTH EVERY DAY.  "NEEDS OFFICE VISIT FOR ADDITIONAL VISIT" Patient taking differently: Take 25 mg by mouth daily.  08/09/14  Yes Robyn Haber, MD  Multiple Vitamin (MULTIVITAMIN WITH MINERALS) TABS tablet Take 1 tablet by mouth daily.   Yes Historical Provider, MD  amLODipine (NORVASC) 5 MG tablet TAKE 1 TABLET BY MOUTH EVERY DAY FOR BLOOD PRESSURE Patient not taking: Reported on 11/29/2014 06/25/14   Robyn Haber, MD  amoxicillin-clavulanate (AUGMENTIN) 875-125 MG per tablet Take 1 tablet by mouth 2 (two) times daily. Patient not taking: Reported on 11/29/2014 08/14/14  Robyn Haber, MD  Blood Pressure Monitoring (ADULT BLOOD PRESSURE CUFF LG) KIT Use daily 08/09/14   Robyn Haber, MD  clindamycin (CLEOCIN) 300 MG capsule Take 1 capsule (300 mg total) by mouth 4 (four) times daily. X 7 days Patient not taking: Reported on 01/08/2015 11/29/14   Carman Ching, PA-C  HYDROcodone-acetaminophen (NORCO/VICODIN) 5-325 MG per tablet Take 1-2 tablets  by mouth every 4 (four) hours as needed. Patient not taking: Reported on 01/08/2015 11/29/14   Carman Ching, PA-C  oxyCODONE-acetaminophen (PERCOCET/ROXICET) 5-325 MG per tablet Take 1 tablet by mouth every 4 (four) hours as needed for moderate pain or severe pain. Patient not taking: Reported on 11/29/2014 08/14/14   Robyn Haber, MD  tamsulosin (FLOMAX) 0.4 MG CAPS capsule TAKE ONE CAPSULE BY MOUTH EVERY DAY  "NEEDS OFFICE VISIT FOR ADDITIONAL REFILLS" Patient not taking: Reported on 11/29/2014 07/28/14   Mancel Bale, PA-C   BP 164/106 mmHg  Pulse 66  Temp(Src) 98.1 F (36.7 C) (Oral)  Resp 18  SpO2 96% Physical Exam  Constitutional: He is oriented to person, place, and time. He appears well-developed and well-nourished. No distress.  HENT:  Head: Normocephalic and atraumatic.  Mouth/Throat: Oropharynx is clear and moist.  Eyes: Conjunctivae are normal. Pupils are equal, round, and reactive to light.  Neck: Neck supple.  Cardiovascular: Normal rate, regular rhythm and intact distal pulses.   Pulmonary/Chest: Effort normal and breath sounds normal. No respiratory distress. He has no wheezes. He has no rales. He exhibits no tenderness.  Abdominal: Soft. There is no tenderness.  Musculoskeletal: He exhibits no tenderness.  Lymphadenopathy:    He has no cervical adenopathy.  Neurological: He is alert and oriented to person, place, and time. He has normal strength. No cranial nerve deficit or sensory deficit. Coordination normal. GCS eye subscore is 4. GCS verbal subscore is 5. GCS motor subscore is 6.  Alert, oriented, thought content appropriate. Speech fluent without evidence of aphasia. Able to follow 2 step commands without difficulty.  Cranial Nerves:  II:  Peripheral visual fields grossly normal, pupils equal, round, reactive to light III,IV, VI: ptosis not present, extra-ocular motions intact bilaterally  V,VII: smile symmetric, facial light touch sensation equal VIII: hearing  grossly normal bilaterally  IX,X: gag reflex present  XI: bilateral shoulder shrug equal and strong XII: midline tongue extension  Motor:  5/5 in upper and lower extremities bilaterally including strong and equal grip strength and dorsiflexion/plantar flexion Sensory: Pinprick and light touch normal in all extremities.  Deep Tendon Reflexes: 2+ and symmetric  Cerebellar: normal finger-to-nose with bilateral upper extremities Gait: normal gait and balance CV: distal pulses palpable throughout    Skin: Skin is warm and dry. No rash noted. He is not diaphoretic.  Psychiatric: He has a normal mood and affect.  Nursing note and vitals reviewed.   ED Course  Procedures (including critical care time) Labs Review Labs Reviewed  CBC WITH DIFFERENTIAL/PLATELET - Abnormal; Notable for the following:    RBC 4.10 (*)    Hemoglobin 12.5 (*)    Neutrophils Relative % 39 (*)    Lymphocytes Relative 47 (*)    All other components within normal limits  BASIC METABOLIC PANEL - Abnormal; Notable for the following:    Potassium 3.4 (*)    Glucose, Bld 103 (*)    Calcium 8.5 (*)    Anion gap 4 (*)    All other components within normal limits  URINALYSIS, ROUTINE W REFLEX MICROSCOPIC  I-STAT  [place], ED    Imaging Review No results found.   EKG Interpretation:     Sinus rhythm Consider left ventricular hypertrophy  Vent. rate 59 BPM PR interval 147 ms QRS duration 89 ms QT/QTc 469/465 ms P-R-T axes 49 46 25  MDM   Final diagnoses:  Essential hypertension   49 yo male presenting with concern about high blood pressure measurement taken after gradual onset headache.  Presentation is like pts typical HA and non concerning for [facility], ICH, Meningitis, or temporal arteritis. Pt is afebrile with no focal neuro deficits, nuchal rigidity, or change in vision. Pt's HA treated and improved while in ED.  His blood pressure remains elevated despite now being pain free. Labs, EKG and exam not  concerning for any end organ failure as his troponin is normal, BUN and creatinine are normal, his neuro is normal and he has no chest pain or shortness of breath. Discussed case with Dr. Sharol Given.   Pt is to follow up with PCP to discuss blood pressure regimen. Pt is well-appearing, in no acute distress and vital signs are stable.  They appear safe to be discharged.  Discharge include follow-up with their PCP.  Return precautions provided.  Pt verbalizes understanding and is agreeable with plan to dc.    Filed Vitals:   01/09/15 0148 01/09/15 0417 01/09/15 0425 01/09/15 0520  BP: 164/106 178/120 171/112 171/106  Pulse: 66  65 65  Temp:   98.1 F (36.7 C) 97.6 F (36.4 C)  TempSrc:   Oral Oral  Resp: _0 SpO2: 96%  96% 100%   Meds given in ED:  Medications  sodium chloride 0.9 % bolus 1,000 mL (0 mLs Intravenous Stopped 01/09/15 0520)  ketorolac (TORADOL) 30 MG/ML injection 30 mg (30 mg Intravenous Given 01/09/15 0305)  prochlorperazine (COMPAZINE) injection 10 mg (10 mg Intravenous Given 01/09/15 0308)  diphenhydrAMINE (BENADRYL) injection 25 mg (25 mg Intravenous Given 01/09/15 0304)  cloNIDine (CATAPRES) tablet 0.1 mg (0.1 mg Oral Given 01/09/15 0417)    Discharge Medication List as of 01/09/2015  4:39 AM         Britt Bottom, NP 01/09/15 [place], MD 01/10/15 1610

## 2015-01-09 NOTE — ED Notes (Signed)
EKG given to EDP,Otter,MD., for review.

## 2015-02-04 ENCOUNTER — Other Ambulatory Visit: Payer: Self-pay | Admitting: Family Medicine

## 2015-02-08 ENCOUNTER — Other Ambulatory Visit: Payer: Self-pay | Admitting: Family Medicine

## 2015-03-06 ENCOUNTER — Ambulatory Visit (INDEPENDENT_AMBULATORY_CARE_PROVIDER_SITE_OTHER): Payer: BLUE CROSS/BLUE SHIELD | Admitting: Family Medicine

## 2015-03-06 VITALS — BP 186/110 | HR 65 | Temp 98.0°F | Resp 18 | Ht 71.0 in | Wt 212.0 lb

## 2015-03-06 DIAGNOSIS — I10 Essential (primary) hypertension: Secondary | ICD-10-CM

## 2015-03-06 MED ORDER — LOSARTAN POTASSIUM-HCTZ 100-25 MG PO TABS: 1.0000 | ORAL_TABLET | Freq: Every day | ORAL | Status: DC

## 2015-03-06 MED ORDER — SPIRONOLACTONE 25 MG PO TABS: 25.0000 mg | ORAL_TABLET | Freq: Every day | ORAL | Status: DC

## 2015-03-06 NOTE — Progress Notes (Signed)
Subjective: Patient is concerned because his blood pressure continues to run high. He was in the emergency room once with a little chest discomfort. He has a evidence of LVH on his EKG there. He is occasionally feel a little fluttering sensation in his chest, never exercise associated. He takes his medicines faithfully. He tries to walk regularly. He asked about other exercise and we talked about things. He does watch his salt intake. No current headaches or dizziness or chest pain.  Objective: No acute distress. Chest clear. Heart regular without murmurs. Blood pressure is noted.  Assessment: Unsatisfactory control hypertension  Plan: Add spironolactone 25 mg twice daily. There is room to increase this gradually if needed. Increase the losartan HCT to 100/25 daily Continue the Catapres TTS patch and metoprolol. Consider changing the metoprolol to bystolic if the above does not give improved control. Needs to have his potassium checked on his return. (BMP) He will return in 3 weeks.

## 2015-03-06 NOTE — Patient Instructions (Signed)
Continue taking your previous medications except for the following changes:  Discontinue the losartan HCT 100/12.5 and begin losartan HCT 100/25 one daily  Take the spironolactone 25 mg twice daily (Aldactone)  Return in 3-4 weeks for a recheck. You should have labs checked at that time to check your potassium level. Please remind the physician to do that.

## 2015-03-11 ENCOUNTER — Encounter (HOSPITAL_COMMUNITY): Payer: Self-pay

## 2015-03-11 ENCOUNTER — Emergency Department (HOSPITAL_COMMUNITY): Payer: BLUE CROSS/BLUE SHIELD

## 2015-03-11 ENCOUNTER — Emergency Department (HOSPITAL_COMMUNITY)
Admission: EM | Admit: 2015-03-11 | Discharge: 2015-03-11 | Disposition: A | Discharge status: Home / Self Care | Payer: BLUE CROSS/BLUE SHIELD | Attending: Emergency Medicine | Admitting: Emergency Medicine

## 2015-03-11 DIAGNOSIS — Y929 Unspecified place or not applicable: Secondary | ICD-10-CM | POA: Insufficient documentation

## 2015-03-11 DIAGNOSIS — Y939 Activity, unspecified: Secondary | ICD-10-CM | POA: Diagnosis not present

## 2015-03-11 DIAGNOSIS — I10 Essential (primary) hypertension: Secondary | ICD-10-CM | POA: Diagnosis not present

## 2015-03-11 DIAGNOSIS — Z9889 Other specified postprocedural states: Secondary | ICD-10-CM | POA: Insufficient documentation

## 2015-03-11 DIAGNOSIS — Y999 Unspecified external cause status: Secondary | ICD-10-CM | POA: Diagnosis not present

## 2015-03-11 DIAGNOSIS — K047 Periapical abscess without sinus: Secondary | ICD-10-CM | POA: Diagnosis not present

## 2015-03-11 DIAGNOSIS — IMO0001 Reserved for inherently not codable concepts without codable children: Secondary | ICD-10-CM

## 2015-03-11 DIAGNOSIS — R030 Elevated blood-pressure reading, without diagnosis of hypertension: Secondary | ICD-10-CM

## 2015-03-11 DIAGNOSIS — Z79899 Other long term (current) drug therapy: Secondary | ICD-10-CM | POA: Diagnosis not present

## 2015-03-11 DIAGNOSIS — I252 Old myocardial infarction: Secondary | ICD-10-CM | POA: Diagnosis not present

## 2015-03-11 DIAGNOSIS — S0990XA Unspecified injury of head, initial encounter: Secondary | ICD-10-CM | POA: Diagnosis present

## 2015-03-11 MED ORDER — OXYCODONE-ACETAMINOPHEN 5-325 MG PO TABS: ORAL_TABLET | ORAL | Status: DC

## 2015-03-11 MED ORDER — AMOXICILLIN 500 MG PO CAPS
1000.0000 mg | ORAL_CAPSULE | Freq: Once | ORAL | Status: AC
  Administered 2015-03-11: 1000 mg via ORAL
  Filled 2015-03-11: qty 2

## 2015-03-11 MED ORDER — HYDROCODONE-ACETAMINOPHEN 5-325 MG PO TABS
1.0000 | ORAL_TABLET | Freq: Once | ORAL | Status: AC
  Administered 2015-03-11: 1 via ORAL
  Filled 2015-03-11: qty 1

## 2015-03-11 MED ORDER — AMOXICILLIN 500 MG PO CAPS: 500.0000 mg | ORAL_CAPSULE | Freq: Three times a day (TID) | ORAL | Status: DC

## 2015-03-11 MED ORDER — HYDRALAZINE HCL 50 MG PO TABS
50.0000 mg | ORAL_TABLET | Freq: Once | ORAL | Status: AC
  Administered 2015-03-11: 50 mg via ORAL
  Filled 2015-03-11 (×2): qty 1

## 2015-03-11 MED ORDER — CLONIDINE HCL 0.1 MG PO TABS
0.1000 mg | ORAL_TABLET | Freq: Once | ORAL | Status: AC
  Administered 2015-03-11: 0.1 mg via ORAL
  Filled 2015-03-11: qty 1

## 2015-03-11 MED ORDER — HYDRALAZINE HCL 25 MG PO TABS: 25.0000 mg | ORAL_TABLET | Freq: Two times a day (BID) | ORAL | Status: DC
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
## 2015-03-11 NOTE — Discharge Instructions (Signed)
Please follow with your primary care doctor in the next 5 days for high blood pressure evaluation. If you do not have a primary care doctor, present to urgent care. Reduce salt intake. Seek emergency medical care for unilateral weakness, slurring, change in vision, or chest pain and shortness of breath.  Take percocet for breakthrough pain, do not drink alcohol, drive, care for children or do other critical tasks while taking percocet.  Return to the emergency room for fever, change in vision, redness to the face that rapidly spreads towards the eye, nausea or vomiting, difficulty swallowing or shortness of breath.   Apply warm compresses to jaw throughout the day.   Take your antibiotics as directed and to the end of the course.   Followup with a dentist is very important for ongoing evaluation and management of recurrent dental pain. Return to emergency department for emergent changing or worsening symptoms."  Low-cost dental clinic: [facility]  at [phone]**  **[facility] at [phone] [address]    You may also call [phone]  Dental Assistance If the dentist on-call cannot see you, please use the resources below:   Patients with Medicaid: [facility] [phone] [address], [place] [address], [phone]  If unable to pay, or uninsured, contact [facility] [phone]) or [place] [phone] in [place], [place] in [facility]) to become qualified for the adult dental clinic  Other [place]- [place], [place], [place], [address]    [phone], Ext. [phone]    2nd and 4th Thursday of the month at 6:30am    10 clients each day by appointment, can sometimes see walk-in     patients if someone does not show for an appointment [place], [place], [place], [address]    [phone] [facility]- [address],  [place], [place], [address]    [phone]  [place] Department- [phone] [facility] Department- [phone]
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
## 2015-03-11 NOTE — ED Notes (Signed)
Questions r/t dc were denied. Pt ambulatory and a&ox4 

## 2015-03-11 NOTE — ED Notes (Signed)
Bed: [facility] Expected date:  Expected time:  Means of arrival:  Comments: Pt still in bed.

## 2015-04-19 ENCOUNTER — Emergency Department (HOSPITAL_COMMUNITY)
Admission: EM | Admit: 2015-04-19 | Discharge: 2015-04-19 | Disposition: A | Discharge status: Home / Self Care | Payer: BLUE CROSS/BLUE SHIELD | Attending: Emergency Medicine | Admitting: Emergency Medicine

## 2015-04-19 ENCOUNTER — Encounter (HOSPITAL_COMMUNITY): Payer: Self-pay

## 2015-04-19 DIAGNOSIS — R51 Headache: Secondary | ICD-10-CM | POA: Diagnosis present

## 2015-04-19 DIAGNOSIS — I10 Essential (primary) hypertension: Secondary | ICD-10-CM

## 2015-04-19 DIAGNOSIS — R350 Frequency of micturition: Secondary | ICD-10-CM

## 2015-04-19 DIAGNOSIS — R0602 Shortness of breath: Secondary | ICD-10-CM | POA: Insufficient documentation

## 2015-04-19 DIAGNOSIS — I252 Old myocardial infarction: Secondary | ICD-10-CM | POA: Insufficient documentation

## 2015-04-19 DIAGNOSIS — Z79899 Other long term (current) drug therapy: Secondary | ICD-10-CM | POA: Diagnosis not present

## 2015-04-19 LAB — URINALYSIS, ROUTINE W REFLEX MICROSCOPIC
Bilirubin Urine: NEGATIVE
Glucose, UA: NEGATIVE mg/dL
Hgb urine dipstick: NEGATIVE
Ketones, ur: NEGATIVE mg/dL
Leukocytes, UA: NEGATIVE
Nitrite: NEGATIVE
PH: 6 (ref 5.0–8.0)
PROTEIN: NEGATIVE mg/dL
SPECIFIC GRAVITY, URINE: 1.01 (ref 1.005–1.030)
Urobilinogen, UA: 0.2 mg/dL (ref 0.0–1.0)

## 2015-04-19 MED ORDER — ACETAMINOPHEN 325 MG PO TABS
650.0000 mg | ORAL_TABLET | Freq: Once | ORAL | Status: AC
  Administered 2015-04-19: 650 mg via ORAL
  Filled 2015-04-19: qty 2

## 2015-04-19 NOTE — ED Provider Notes (Signed)
CSN: [id number]     Arrival date & time 04/19/15  0810 History   First MD Initiated Contact with Patient 04/19/15 0827     Chief Complaint  Patient presents with  . Hypertension  . Headache  . Dizziness     (Consider location/radiation/quality/duration/timing/severity/associated sxs/prior Treatment) HPI 49 y.o. Male history of hypertension presents complaining of hypertension and headache- patient noted headache in early am and took bp and was elevated sbp 170s, rechecked twice 180s then 200.  Patient did not take am meds and came here secondary to the hypertension.  However, now asymptomatic- no headache, bp 150 , no dizziness.  Patient endorsed some urinary frequency and feeling of unable to empty bladder.  Patient's clonidine patch is due to be changed today.  He has not taken his am meds Past Medical History  Diagnosis Date  . Hypertension   . MI (myocardial infarction)    Past Surgical History  Procedure Laterality Date  . Hernia repair    . Tooth extraction Right 04/21/2013    Procedure: EXTRACTION MOLARS;  Surgeon: [name], DDS;  Location: [place];  Service: Oral Surgery;  Laterality: Right;  . Eye removed    . Intraocular prostheses insertion     Family History  Problem Relation Age of Onset  . Hypertension Father   . Depression Brother    Social History  Substance Use Topics  . Smoking status: Never Smoker   . Smokeless tobacco: None  . Alcohol Use: No    Review of Systems  Respiratory: Positive for shortness of breath.   Genitourinary: Positive for urgency.  All other systems reviewed and are negative.     Allergies  Pollen extract  Home Medications   Prior to Admission medications   Medication Sig Start Date End Date Taking? Authorizing Provider  acetaminophen (TYLENOL) 500 MG tablet Take 500 mg by mouth every 6 (six) hours as needed for mild pain, moderate pain or headache.   Yes Historical Provider, MD  cloNIDine (CATAPRES-TTS-2) 0.2 mg/24hr  patch Place 1 patch (0.2 mg total) onto the skin once a week. 08/09/14  Yes Robyn Haber, MD  fluticasone (FLONASE) 50 MCG/ACT nasal spray PLACE 2 SPRAYS INTO BOTH NOSTRILS DAILY. Patient taking differently: PLACE 2 SPRAYS INTO BOTH NOSTRILS DAILY as needed for allergies 07/22/14  Yes Thao P Le, DO  hydrOXYzine (VISTARIL) 100 MG capsule TAKE ONE CAPSULE BY MOUTH AT BEDTIME AS NEEDED FOR ALLERGY Patient taking differently: TAKE 100 MG BY MOUTH AT BEDTIME AS NEEDED FOR ALLERGIES 12/12/14  Yes Robyn Haber, MD  losartan-hydrochlorothiazide (HYZAAR) 100-25 MG per tablet Take 1 tablet by mouth daily. 03/06/15  Yes Posey Boyer, MD  metoprolol succinate (TOPROL-XL) 25 MG 24 hr tablet TAKE 1 TABLET BY MOUTH EVERY DAY.  "NEEDS OFFICE VISIT FOR ADDITIONAL VISIT" Patient taking differently: Take 25 mg by mouth daily.  08/09/14  Yes Robyn Haber, MD  spironolactone (ALDACTONE) 25 MG tablet Take 1 tablet (25 mg total) by mouth daily. 03/06/15  Yes Posey Boyer, MD  amoxicillin (AMOXIL) 500 MG capsule Take 1 capsule (500 mg total) by mouth 3 (three) times daily. Patient not taking: Reported on 04/19/2015 03/11/15   Elmyra Ricks Pisciotta, PA-C  hydrALAZINE (APRESOLINE) 25 MG tablet Take 1 tablet (25 mg total) by mouth 2 (two) times daily. Patient not taking: Reported on 04/19/2015 03/11/15   Elmyra Ricks Pisciotta, PA-C  oxyCODONE-acetaminophen (PERCOCET/ROXICET) 5-325 MG per tablet 1 to 2 tabs PO q6hrs  PRN for pain Patient not taking:  Reported on 04/19/2015 03/11/15   Elmyra Ricks Pisciotta, PA-C   BP 150/107 mmHg  Pulse 67  Temp(Src) 97.9 F (36.6 C) (Oral)  Resp 17  SpO2 99% Physical Exam  Constitutional: He is oriented to person, place, and time. He appears well-developed and well-nourished.  HENT:  Head: Normocephalic and atraumatic.  Right Ear: External ear normal.  Left Ear: External ear normal.  Nose: Nose normal.  Mouth/Throat: Oropharynx is clear and moist.  Eyes: Conjunctivae are normal.  Prosthetic left  eye  Neck: Normal range of motion. Neck supple.  Cardiovascular: Normal rate, regular rhythm, normal heart sounds and intact distal pulses.   Pulmonary/Chest: Effort normal and breath sounds normal. No respiratory distress. He has no wheezes. He exhibits no tenderness.  Abdominal: Soft. Bowel sounds are normal. He exhibits no distension and no mass. There is no tenderness. There is no guarding.  Musculoskeletal: Normal range of motion.  Neurological: He is alert and oriented to person, place, and time. He has normal reflexes. He exhibits normal muscle tone. Coordination normal.  Skin: Skin is warm and dry.  Psychiatric: He has a normal mood and affect. His behavior is normal. Judgment and thought content normal.  Nursing note and vitals reviewed.   ED Course  Procedures (including critical care time) Labs Review Labs Reviewed  URINALYSIS, ROUTINE W REFLEX MICROSCOPIC (NOT AT [facility])      MDM   Final diagnoses:  Essential hypertension  Urinary frequency    49 year old male with history of difficult to control hypertension who presents today complaining that he had a headache last night with hypertension. Here his symptoms have essentially resolved. He is continued hypertensive with systolic blood pressure 375/43, but I do not think this would be causing his symptoms. Subsequently his symptoms have resolved. He is evaluated for some frequency of urination and currently urinalysis is pending. He has not taken any of this a.m. meds and today his Cardene patch is due to be changed. I feel it is likely that his blood pressure will have better control once he has taken his medications and change his clonidine patch. He is encouraged to do this after arrival home. I did not feel that any other workup was indicated here in the emergency department. He has remained hemodynamically stable and is instructed regarding monitoring his blood pressure and for follow-up.    Pattricia Boss, MD 04/19/15  [phone]

## 2015-04-19 NOTE — ED Notes (Signed)
[facility]- pt coming from home, began having headache around midnight and checked his BP. Pt reports BP has been increasing since last night. Pt reports headache and dizziness. Pt a&o X4.

## 2015-04-19 NOTE — Discharge Instructions (Signed)
Please change your clonidine patch after arrival home as well as your morning medications. Return if any change in pain, especially chest pain, or shortness of breath.  Keep record of your blood pressure three times per day and take log to your primary doctor in 2-5 days.

## 2015-05-05 ENCOUNTER — Other Ambulatory Visit: Payer: Self-pay | Admitting: Family Medicine

## 2015-06-01 ENCOUNTER — Other Ambulatory Visit: Payer: Self-pay | Admitting: Physician Assistant

## 2015-08-21 ENCOUNTER — Other Ambulatory Visit: Payer: Self-pay | Admitting: Family Medicine

## 2015-08-23 ENCOUNTER — Other Ambulatory Visit: Payer: Self-pay | Admitting: Family Medicine

## 2015-09-17 ENCOUNTER — Telehealth: Payer: Self-pay | Admitting: Family Medicine

## 2015-09-17 NOTE — Telephone Encounter (Signed)
Left a message for patient to return call to schedule a follow up appointment for hypertension.

## 2015-09-25 ENCOUNTER — Other Ambulatory Visit: Payer: Self-pay | Admitting: Family Medicine

## 2015-09-27 ENCOUNTER — Other Ambulatory Visit: Payer: Self-pay | Admitting: Family Medicine

## 2015-10-12 ENCOUNTER — Ambulatory Visit (INDEPENDENT_AMBULATORY_CARE_PROVIDER_SITE_OTHER): Payer: Managed Care, Other (non HMO) | Admitting: Family Medicine

## 2015-10-12 VITALS — BP 136/88 | HR 81 | Temp 97.8°F | Resp 12 | Ht 68.0 in | Wt 219.0 lb

## 2015-10-12 DIAGNOSIS — I10 Essential (primary) hypertension: Secondary | ICD-10-CM

## 2015-10-12 DIAGNOSIS — J0100 Acute maxillary sinusitis, unspecified: Secondary | ICD-10-CM | POA: Diagnosis not present

## 2015-10-12 MED ORDER — LOSARTAN POTASSIUM-HCTZ 100-25 MG PO TABS: ORAL_TABLET | ORAL | Status: DC

## 2015-10-12 MED ORDER — CLONIDINE HCL 0.2 MG/24HR TD PTWK: MEDICATED_PATCH | TRANSDERMAL | Status: DC

## 2015-10-12 MED ORDER — SPIRONOLACTONE 25 MG PO TABS: 25.0000 mg | ORAL_TABLET | Freq: Every day | ORAL | Status: DC

## 2015-10-12 MED ORDER — AMOXICILLIN 500 MG PO CAPS: 500.0000 mg | ORAL_CAPSULE | Freq: Three times a day (TID) | ORAL | Status: DC

## 2015-10-12 MED ORDER — METOPROLOL SUCCINATE ER 25 MG PO TB24: ORAL_TABLET | ORAL | Status: DC

## 2015-10-12 MED ORDER — HYDROXYZINE PAMOATE 100 MG PO CAPS: ORAL_CAPSULE | ORAL | Status: DC

## 2015-10-12 MED ORDER — HYDRALAZINE HCL 25 MG PO TABS: 25.0000 mg | ORAL_TABLET | Freq: Two times a day (BID) | ORAL | Status: DC

## 2015-10-12 NOTE — Patient Instructions (Signed)
I have written your blood pressure medication for a year  You have a sinus infection.  I have ordered amoxicillin for you  Call me in May when you are about to turn 50 and I will refer you for a colonoscopy

## 2015-10-12 NOTE — Progress Notes (Signed)
By signing my name below, I, Moises Blood, attest that this documentation has been prepared under the direction and in the presence of Robyn Haber, MD. Electronically Signed: Moises Blood, [place]. 10/12/2015 , 1:59 PM .  Patient was seen in room 11 .   Patient ID: Dylan Gonzalez MRN: [id number], DOB: 04/14/1966, 50 y.o. Date of Encounter: 10/12/2015  Primary Physician: Robyn Haber, MD  Chief Complaint:  Chief Complaint  Patient presents with  . Nausea  . Chills  . Abdominal Pain  . Headache    HPI:  Dylan Gonzalez is a 50 y.o. male who presents to [facility] complaining of illness that started a week ago. He's had productive coughs with chills, abd pain, headache and nausea. He also feels congested with some blood when he blows out. He believes he had sick contact from work.   He also mentions about a raising ridge on the dorsal aspect of his left foot. He has some discomfort when he's on his feet often.  He also informs having some "spider veins" on his upper thighs. He denies pain from the area.   He's planning to start exercising regularly again because he noticed his weight has gone up.  Wt Readings from Last 3 Encounters:  10/12/15 219 lb (99.338 kg)  03/06/15 212 lb (96.163 kg)  08/14/14 206 lb 12.8 oz (93.804 kg)   His BP has been doing well.  He works at [employer].   Past Medical History  Diagnosis Date  . Hypertension   . MI (myocardial infarction) ([place])      Home Meds: Prior to Admission medications   Medication Sig Start Date End Date Taking? Authorizing Provider  acetaminophen (TYLENOL) 500 MG tablet Take 500 mg by mouth every 6 (six) hours as needed for mild pain, moderate pain or headache.    Historical Provider, MD  amoxicillin (AMOXIL) 500 MG capsule Take 1 capsule (500 mg total) by mouth 3 (three) times daily. Patient not taking: Reported on 04/19/2015 03/11/15   Elmyra Ricks Pisciotta, PA-C  cloNIDine (CATAPRES - DOSED IN  MG/24 HR) 0.2 mg/24hr patch APPLY 1 PATCH ONTO THE SKIN ONCE A WEEK.  "OFFICE VISIT NEEDED FOR REFILLS' 08/22/15   Robyn Haber, MD  fluticasone (FLONASE) 50 MCG/ACT nasal spray PLACE 2 SPRAYS INTO BOTH NOSTRILS DAILY. Patient taking differently: PLACE 2 SPRAYS INTO BOTH NOSTRILS DAILY as needed for allergies 07/22/14   Thao P Le, DO  hydrALAZINE (APRESOLINE) 25 MG tablet Take 1 tablet (25 mg total) by mouth 2 (two) times daily. Patient not taking: Reported on 04/19/2015 03/11/15   Elmyra Ricks Pisciotta, PA-C  hydrOXYzine (VISTARIL) 100 MG capsule TAKE ONE CAPSULE BY MOUTH AT BEDTIME AS NEEDED FOR ALLERGY Patient taking differently: TAKE 100 MG BY MOUTH AT BEDTIME AS NEEDED FOR ALLERGIES 12/12/14   Robyn Haber, MD  losartan-hydrochlorothiazide [facility]) 100-25 MG tablet Take 1 tablet by mouth every day 09/28/15   Harrison Mons, PA-C  metoprolol succinate (TOPROL-XL) 25 MG 24 hr tablet TAKE 1 TABLET BY MOUTH EVERY DAY.  (NO MORE REFILLS WITHOUT OV) 06/01/15   Mancel Bale, PA-C  oxyCODONE-acetaminophen (PERCOCET/ROXICET) 5-325 MG per tablet 1 to 2 tabs PO q6hrs  PRN for pain Patient not taking: Reported on 04/19/2015 03/11/15   Elmyra Ricks Pisciotta, PA-C  spironolactone (ALDACTONE) 25 MG tablet Take 1 tablet (25 mg total) by mouth daily. 03/06/15   Posey Boyer, MD    Allergies:  Allergies  Allergen Reactions  . Pollen Extract  Sneezing and shortness of breath    Social History   Social History  . Marital Status: Married    Spouse Name: [name]  . Number of Children: [name]  . Years of Education: [name]   Occupational History  . Not on file.   Social History Main Topics  . Smoking status: Never Smoker   . Smokeless tobacco: Not on file  . Alcohol Use: No  . Drug Use: No  . Sexual Activity: Not Currently   Other Topics Concern  . Not on file   Social History Narrative     Review of Systems: Constitutional: negative for fever, night sweats, weight changes, or fatigue; positive for  chills HEENT: negative for vision changes, hearing loss, congestion, rhinorrhea, ST, epistaxis, or sinus pressure Cardiovascular: negative for chest pain or palpitations Respiratory: negative for hemoptysis, wheezing, shortness of breath; positive for cough Abdominal: negative for vomiting, diarrhea, or constipation; positive for nausea, abd pain Dermatological: negative for rash Musc: positive for myalgia (left foot) Neurologic: negative for dizziness, or syncope; positive for headache All other systems reviewed and are otherwise negative with the exception to those above and in the HPI.  Physical Exam: Blood pressure 136/88, pulse 81, temperature 97.8 F (36.6 C), temperature source Oral, resp. rate 12, height '5\' 8"'$  (1.727 m), weight 219 lb (99.338 kg), SpO2 98 %., Body mass index is 33.31 kg/(m^2). General: Well developed, well nourished, in no acute distress. Head: Normocephalic, atraumatic, eyes without discharge, sclera non-icteric. Bilateral auditory canals clear, TM's are without perforation, pearly grey and translucent with reflective cone of light bilaterally. Oral cavity moist, posterior pharynx without exudate, erythema, peritonsillar abscess, or post nasal drip.  Neck: Supple. No thyromegaly. Full ROM. No lymphadenopathy. Lungs: Clear bilaterally to auscultation without wheezes, rales, or rhonchi. Breathing is unlabored. Heart: RRR with S1 S2. No murmurs, rubs, or gallops appreciated. Msk:  Strength and tone normal for age. Extremities/Skin: Warm and dry. Metatarsal arch in left foot Neuro: Alert and oriented X 3. Moves all extremities spontaneously. Gait is normal. CNII-XII grossly in tact. Psych:  Responds to questions appropriately with a normal affect.   Results for orders placed or performed during the hospital encounter of 04/19/15  Urinalysis, Routine w reflex microscopic (not at [facility])  Result Value Ref Range   Color, Urine YELLOW YELLOW   APPearance CLEAR CLEAR    Specific Gravity, Urine 1.010 1.005 - 1.030   pH 6.0 5.0 - 8.0   Glucose, UA NEGATIVE NEGATIVE mg/dL   Hgb urine dipstick NEGATIVE NEGATIVE   Bilirubin Urine NEGATIVE NEGATIVE   Ketones, ur NEGATIVE NEGATIVE mg/dL   Protein, ur NEGATIVE NEGATIVE mg/dL   Urobilinogen, UA 0.2 0.0 - 1.0 mg/dL   Nitrite NEGATIVE NEGATIVE   Leukocytes, UA NEGATIVE NEGATIVE     ASSESSMENT AND PLAN:  50 y.o. year old male with Essential hypertension, benign - Plan: spironolactone (ALDACTONE) 25 MG tablet, metoprolol succinate (TOPROL-XL) 25 MG 24 hr tablet, losartan-hydrochlorothiazide (HYZAAR) 100-25 MG tablet, hydrALAZINE (APRESOLINE) 25 MG tablet, cloNIDine (CATAPRES - DOSED IN MG/24 HR) 0.2 mg/24hr patch  Acute maxillary sinusitis, recurrence not specified - Plan: hydrOXYzine (VISTARIL) 100 MG capsule, amoxicillin (AMOXIL) 500 MG capsule      Signed, Robyn Haber, MD 10/12/2015 1:59 PM

## 2015-12-07 ENCOUNTER — Ambulatory Visit (INDEPENDENT_AMBULATORY_CARE_PROVIDER_SITE_OTHER): Payer: Managed Care, Other (non HMO) | Admitting: Family Medicine

## 2015-12-07 VITALS — BP 132/110 | HR 82 | Temp 97.5°F | Resp 16 | Ht 70.75 in | Wt 218.6 lb

## 2015-12-07 DIAGNOSIS — M5412 Radiculopathy, cervical region: Secondary | ICD-10-CM

## 2015-12-07 DIAGNOSIS — I10 Essential (primary) hypertension: Secondary | ICD-10-CM | POA: Diagnosis not present

## 2015-12-07 DIAGNOSIS — R208 Other disturbances of skin sensation: Secondary | ICD-10-CM | POA: Diagnosis not present

## 2015-12-07 DIAGNOSIS — M6740 Ganglion, unspecified site: Secondary | ICD-10-CM | POA: Diagnosis not present

## 2015-12-07 DIAGNOSIS — R200 Anesthesia of skin: Secondary | ICD-10-CM

## 2015-12-07 NOTE — Patient Instructions (Signed)
IF you received an x-ray today, you will receive an invoice from [facility]. Please contact [facility] at [phone] with questions or concerns regarding your invoice.   IF you received labwork today, you will receive an invoice from [employer]. Please contact [facility] at [phone] with questions or concerns regarding your invoice.   Our billing staff will not be able to assist you with questions regarding bills from these companies.  You will be contacted with the lab results as soon as they are available. The fastest way to get your results is to activate your My Chart account. Instructions are located on the last page of this paperwork. If you have not heard from [place] regarding the results in 2 weeks, please contact this office.     The bump on your hand appears to be a ganglion cyst. If it is not bothering you, no intervention needed at this time. If it increases in size or becomes uncomfortable, there is a procedure removing the fluid from the cystoscopy either we can do or the hand surgeon. Less noticed this occurs.  Blood pressure was elevated here today. Check your blood pressure at home, and if remaining over 140 on the top number or 90 or higher on a lower number, return to discuss her medications further.  Here numbness on top of arm may be due to pinched nerve in the neck. See information on this below. Tylenol is safe this right now with your elevated blood pressure. Recheck in the next 1-2 weeks if your symptoms are not improved. If your symptoms worsen return sooner.  Return to the clinic or go to the nearest emergency room if any of your symptoms worsen or new symptoms occur.  Cervical Radiculopathy Cervical radiculopathy happens when a nerve in the neck (cervical nerve) is pinched or bruised. This condition can develop because of an injury or as part of the normal aging process. Pressure on the cervical nerves can cause  pain or numbness that runs from the neck all the way down into the arm and fingers. Usually, this condition gets better with rest. Treatment may be needed if the condition does not improve.  CAUSES This condition may be caused by:  Injury.  Slipped (herniated) disk.  Muscle tightness in the neck because of overuse.  Arthritis.  Breakdown or degeneration in the bones and joints of the spine (spondylosis) due to aging.  Bone spurs that may develop near the cervical nerves. SYMPTOMS Symptoms of this condition include:  Pain that runs from the neck to the arm and hand. The pain can be severe or irritating. It may be worse when the neck is moved.  Numbness or weakness in the affected arm and hand. DIAGNOSIS This condition may be diagnosed based on symptoms, medical history, and a physical exam. You may also have tests, including:  X-rays.  CT scan.  MRI.  Electromyogram (EMG).  Nerve conduction tests. TREATMENT In many cases, treatment is not needed for this condition. With rest, the condition usually gets better over time. If treatment is needed, options may include:  Wearing a soft neck collar for short periods of time.  Physical therapy to strengthen your neck muscles.  Medicines, such as NSAIDs, oral corticosteroids, or spinal injections.  Surgery. This may be needed if other treatments do not help. Various types of surgery may be done depending on the cause of your problems. HOME CARE INSTRUCTIONS Managing Pain  Take over-the-counter and prescription medicines only as  told by your health care provider.  If directed, apply ice to the affected area.  Put ice in a plastic bag.  Place a towel between your skin and the bag.  Leave the ice on for 20 minutes, 2-3 times per day.  If ice does not help, you can try using heat. Take a warm shower or warm bath, or use a heat pack as told by your health care provider.  Try a gentle neck and shoulder massage to help  relieve symptoms. Activity  Rest as needed. Follow instructions from your health care provider about any restrictions on activities.  Do stretching and strengthening exercises as told by your health care provider or physical therapist. General Instructions  If you were given a soft collar, wear it as told by your health care provider.  Use a flat pillow when you sleep.  Keep all follow-up visits as told by your health care provider. This is important. SEEK MEDICAL CARE IF:  Your condition does not improve with treatment. SEEK IMMEDIATE MEDICAL CARE IF:  Your pain gets much worse and cannot be controlled with medicines.  You have weakness or numbness in your hand, arm, face, or leg.  You have a high fever.  You have a stiff, rigid neck.  You lose control of your bowels or your bladder (have incontinence).  You have trouble with walking, balance, or speaking.   This information is not intended to replace advice given to you by your health care provider. Make sure you discuss any questions you have with your health care provider.   Document Released: 05/18/2001 Document Revised: 05/14/2015 Document Reviewed: 10/17/2014 Elsevier Interactive Patient Education 2016 [employer] Ganglion Cyst A ganglion cyst is a noncancerous, fluid-filled lump that occurs near joints or tendons. The ganglion cyst grows out of a joint or the lining of a tendon. It most often develops in the hand or wrist, but it can also develop in the shoulder, elbow, hip, knee, ankle, or foot. The round or oval ganglion cyst can be the size of a pea or larger than a grape. Increased activity may enlarge the size of the cyst because more fluid starts to build up.  CAUSES It is not known what causes a ganglion cyst to grow. However, it may be related to:  Inflammation or irritation around the joint.  An injury.  Repetitive movements or overuse.  Arthritis. RISK FACTORS Risk factors include:  Being a  woman.  Being age 47-50. SIGNS AND SYMPTOMS Symptoms may include:   A lump. This most often appears on the hand or wrist, but it can occur in other areas of the body.  Tingling.  Pain.  Numbness.  Muscle weakness.  Weak grip.  Less movement in a joint. DIAGNOSIS Ganglion cysts are most often diagnosed based on a physical exam. Your health care provider will feel the lump and may shine a light alongside it. If it is a ganglion cyst, a light often shines through it. Your health care provider may order an X-ray, ultrasound, or MRI to rule out other conditions. TREATMENT Ganglion cysts usually go away on their own without treatment. If pain or other symptoms are involved, treatment may be needed. Treatment is also needed if the ganglion cyst limits your movement or if it gets infected. Treatment may include:  Wearing a brace or splint on your wrist or finger.  Taking anti-inflammatory medicine.  Draining fluid from the lump with a needle (aspiration).  Injecting a steroid into the joint.  Surgery to remove the ganglion cyst. HOME CARE INSTRUCTIONS  Do not press on the ganglion cyst, poke it with a needle, or hit it.  Take medicines only as directed by your health care provider.  Wear your brace or splint as directed by your health care provider.  Watch your ganglion cyst for any changes.  Keep all follow-up visits as directed by your health care provider. This is important. SEEK MEDICAL CARE IF:  Your ganglion cyst becomes larger or more painful.  You have increased redness, red streaks, or swelling.  You have pus coming from the lump.  You have weakness or numbness in the affected area.  You have a fever or chills.   This information is not intended to replace advice given to you by your health care provider. Make sure you discuss any questions you have with your health care provider.   Document Released: 08/20/2000 Document Revised: 09/13/2014 Document  Reviewed: 02/05/2014 Elsevier Interactive Patient Education [employer].

## 2015-12-07 NOTE — Progress Notes (Addendum)
Subjective:    Patient ID: Dylan Gonzalez, male    DOB: [birth year], 50 y.o.   MRN: [id number] By signing my name below, I, Dylan Gonzalez, attest that this documentation has been prepared under the direction and in the presence of Dylan Ray, MD.  Electronically Signed: Zola Gonzalez, Medical Scribe. 12/07/2015. 2:31 PM.  HPI HPI Comments: Dylan Gonzalez is a 50 y.o. male with a history of hypertension who presents to the [facility] complaining of a bump on the dorsum his left hand that he first noticed after waking up this morning. Patient notes he went bowling yesterday and did use his left hand to bowl. He denies pain in the hand.  Patient notes he felt some numbness in his right arm last week.  Noted elevated blood pressure on check-in. His blood pressure medications have been prescribed by his PCP, Dr. [name]. He states he has not missed any doses of his medications recently. Patient denies chest pain, SOB, lightheadedness, dizziness, and visual changes. He also denies recent alcohol use.  Patient Active Problem List   Diagnosis Date Noted  . Hypertension 12/26/2011  . 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 Hypertension   . MI (myocardial infarction) [facility])    Past Surgical History  Procedure Laterality Date  . Hernia repair    . Tooth extraction Right 04/21/2013    Procedure: EXTRACTION MOLARS;  Surgeon: [name], DDS;  Location: [place];  Service: Oral Surgery;  Laterality: Right;  . Eye removed    . Intraocular prostheses insertion     Allergies  Allergen Reactions  . Pollen Extract     Sneezing and shortness of breath   Prior to Admission medications   Medication Sig Start Date End Date Taking? Authorizing Provider  cloNIDine (CATAPRES - DOSED IN MG/24 HR) 0.2 mg/24hr patch APPLY 1 PATCH ONTO THE SKIN ONCE A WEEK. 10/12/15  Yes Robyn Haber, MD  fluticasone (FLONASE) 50 MCG/ACT nasal spray  PLACE 2 SPRAYS INTO BOTH NOSTRILS DAILY. 07/22/14  Yes Thao P Le, DO  hydrALAZINE (APRESOLINE) 25 MG tablet Take 1 tablet (25 mg total) by mouth 2 (two) times daily. 10/12/15  Yes Robyn Haber, MD  hydrOXYzine (VISTARIL) 100 MG capsule TAKE ONE CAPSULE BY MOUTH AT BEDTIME AS NEEDED FOR ALLERGY 10/12/15  Yes Robyn Haber, MD  losartan-hydrochlorothiazide [facility]) 100-25 MG tablet Take 1 tablet by mouth every day 10/12/15  Yes Robyn Haber, MD  metoprolol succinate (TOPROL-XL) 25 MG 24 hr tablet TAKE 1 TABLET BY MOUTH EVERY DAY. 10/12/15  Yes Robyn Haber, MD  oxyCODONE-acetaminophen (PERCOCET/ROXICET) 5-325 MG per tablet 1 to 2 tabs PO q6hrs  PRN for pain 03/11/15  Yes Nicole Pisciotta, PA-C  spironolactone (ALDACTONE) 25 MG tablet Take 1 tablet (25 mg total) by mouth daily. 10/12/15  Yes Robyn Haber, MD  acetaminophen (TYLENOL) 500 MG tablet Take 500 mg by mouth every 6 (six) hours as needed for mild pain, moderate pain or headache. Reported on 12/07/2015    Historical Provider, MD  amoxicillin (AMOXIL) 500 MG capsule Take 1 capsule (500 mg total) by mouth 3 (three) times daily. Patient not taking: Reported on 12/07/2015 10/12/15   Robyn Haber, MD   Social History   Social History  . Marital Status: Married    Spouse Name: [name]  . Number of Children: [name]  . Years of Education: [name]   Occupational History  . Not on file.  Social History Main Topics  . Smoking status: Never Smoker   . Smokeless tobacco: Not on file  . Alcohol Use: No  . Drug Use: No  . Sexual Activity: Not Currently   Other Topics Concern  . Not on file   Social History Narrative     Review of Systems  Eyes: Negative for visual disturbance.  Respiratory: Negative for shortness of breath.   Cardiovascular: Negative for chest pain.  Musculoskeletal: Negative for myalgias and arthralgias.  Neurological: Positive for numbness. Negative for dizziness and light-headedness.       Objective:   Physical Exam    Constitutional: He is oriented to person, place, and time. He appears well-developed and well-nourished.  HENT:  Head: Normocephalic and atraumatic.  Eyes: EOM are normal. Pupils are equal, round, and reactive to light.  Neck: No JVD present. Carotid bruit is not present.  C-spine ROM decreased extension, otherwise intact. C-spine ROM did reproduce symptoms at dorsum of right arm.  Cardiovascular: Normal rate, regular rhythm and normal heart sounds.   No murmur heard. Pulmonary/Chest: Effort normal and breath sounds normal. He has no rales.  Musculoskeletal: He exhibits no edema.  Full ROM of the hand and wrist without pain.  Neurological: He is alert and oriented to person, place, and time.  Equal strength left and right arms. No focal weakness.  Skin: Skin is warm and dry.  Fairly mobile, firm cystic area below the skin on the left dorsum of the hand. No appreciable movement with flexion and extension of the 2nd, 3rd, and 4th digits. Located proximally to the 2nd and 3rd MCPs on the dorsum of his hand, 7-8 mm across. Non-tender. Skin intact.  Psychiatric: He has a normal mood and affect.  Vitals reviewed.   Filed Vitals:   12/07/15 1424  BP: 132/110  Pulse: 82  Temp: 97.5 F (36.4 C)  TempSrc: Oral  Resp: 16  Height: 5' 10.75" (1.797 m)  Weight: 218 lb 9.6 oz (99.156 kg)  SpO2: 99%         Assessment & Plan:  Gonzalez MICHALEC is a 50 y.o. male Ganglion cyst  Essential hypertension  Arm numbness left  Cervical radiculopathy   No orders of the defined types were placed in this encounter.   Patient Instructions       IF you received an x-Gonzalez today, you will receive an invoice from [facility]. Please contact [facility] at [phone] with questions or concerns regarding your invoice.   IF you received labwork today, you will receive an invoice from [employer]. Please contact [facility] at [phone] with questions  or concerns regarding your invoice.   Our billing staff will not be able to assist you with questions regarding bills from these companies.  You will be contacted with the lab results as soon as they are available. The fastest way to get your results is to activate your My Chart account. Instructions are located on the last page of this paperwork. If you have not heard from [place] regarding the results in 2 weeks, please contact this office.     The bump on your hand appears to be a ganglion cyst. If it is not bothering you, no intervention needed at this time. If it increases in size or becomes uncomfortable, there is a procedure removing the fluid from the cystoscopy either we can do or the hand surgeon. Less noticed this occurs.  Blood pressure was elevated here today. Check your blood pressure at  home, and if remaining over 140 on the top number or 90 or higher on a lower number, return to discuss her medications further.  Here numbness on top of arm may be due to pinched nerve in the neck. See information on this below. Tylenol is safe this right now with your elevated blood pressure. Recheck in the next 1-2 weeks if your symptoms are not improved. If your symptoms worsen return sooner.  Return to the clinic or go to the nearest emergency room if any of your symptoms worsen or new symptoms occur.  Cervical Radiculopathy Cervical radiculopathy happens when a nerve in the neck (cervical nerve) is pinched or bruised. This condition can develop because of an injury or as part of the normal aging process. Pressure on the cervical nerves can cause pain or numbness that runs from the neck all the way down into the arm and fingers. Usually, this condition gets better with rest. Treatment may be needed if the condition does not improve.  CAUSES This condition may be caused by:  Injury.  Slipped (herniated) disk.  Muscle tightness in the neck because of overuse.  Arthritis.  Breakdown or  degeneration in the bones and joints of the spine (spondylosis) due to aging.  Bone spurs that may develop near the cervical nerves. SYMPTOMS Symptoms of this condition include:  Pain that runs from the neck to the arm and hand. The pain can be severe or irritating. It may be worse when the neck is moved.  Numbness or weakness in the affected arm and hand. DIAGNOSIS This condition may be diagnosed based on symptoms, medical history, and a physical exam. You may also have tests, including:  X-rays.  CT scan.  MRI.  Electromyogram (EMG).  Nerve conduction tests. TREATMENT In many cases, treatment is not needed for this condition. With rest, the condition usually gets better over time. If treatment is needed, options may include:  Wearing a soft neck collar for short periods of time.  Physical therapy to strengthen your neck muscles.  Medicines, such as NSAIDs, oral corticosteroids, or spinal injections.  Surgery. This may be needed if other treatments do not help. Various types of surgery may be done depending on the cause of your problems. HOME CARE INSTRUCTIONS Managing Pain  Take over-the-counter and prescription medicines only as told by your health care provider.  If directed, apply ice to the affected area.  Put ice in a plastic bag.  Place a towel between your skin and the bag.  Leave the ice on for 20 minutes, 2-3 times per day.  If ice does not help, you can try using heat. Take a warm shower or warm bath, or use a heat pack as told by your health care provider.  Try a gentle neck and shoulder massage to help relieve symptoms. Activity  Rest as needed. Follow instructions from your health care provider about any restrictions on activities.  Do stretching and strengthening exercises as told by your health care provider or physical therapist. General Instructions  If you were given a soft collar, wear it as told by your health care provider.  Use a flat  pillow when you sleep.  Keep all follow-up visits as told by your health care provider. This is important. SEEK MEDICAL CARE IF:  Your condition does not improve with treatment. SEEK IMMEDIATE MEDICAL CARE IF:  Your pain gets much worse and cannot be controlled with medicines.  You have weakness or numbness in your hand, arm, face, or  leg.  You have a high fever.  You have a stiff, rigid neck.  You lose control of your bowels or your bladder (have incontinence).  You have trouble with walking, balance, or speaking.   This information is not intended to replace advice given to you by your health care provider. Make sure you discuss any questions you have with your health care provider.   Document Released: 05/18/2001 Document Revised: 05/14/2015 Document Reviewed: 10/17/2014 Elsevier Interactive Patient Education 2016 Elsevier Inc. Ganglion Cyst A ganglion cyst is a noncancerous, fluid-filled lump that occurs near joints or tendons. The ganglion cyst grows out of a joint or the lining of a tendon. It most often develops in the hand or wrist, but it can also develop in the shoulder, elbow, hip, knee, ankle, or foot. The round or oval ganglion cyst can be the size of a pea or larger than a grape. Increased activity may enlarge the size of the cyst because more fluid starts to build up.  CAUSES It is not known what causes a ganglion cyst to grow. However, it may be related to:  Inflammation or irritation around the joint.  An injury.  Repetitive movements or overuse.  Arthritis. RISK FACTORS Risk factors include:  Being a woman.  Being age 47-50. SIGNS AND SYMPTOMS Symptoms may include:   A lump. This most often appears on the hand or wrist, but it can occur in other areas of the body.  Tingling.  Pain.  Numbness.  Muscle weakness.  Weak grip.  Less movement in a joint. DIAGNOSIS Ganglion cysts are most often diagnosed based on a physical exam. Your health care  provider will feel the lump and may shine a light alongside it. If it is a ganglion cyst, a light often shines through it. Your health care provider may order an X-Gonzalez, ultrasound, or MRI to rule out other conditions. TREATMENT Ganglion cysts usually go away on their own without treatment. If pain or other symptoms are involved, treatment may be needed. Treatment is also needed if the ganglion cyst limits your movement or if it gets infected. Treatment may include:  Wearing a brace or splint on your wrist or finger.  Taking anti-inflammatory medicine.  Draining fluid from the lump with a needle (aspiration).  Injecting a steroid into the joint.  Surgery to remove the ganglion cyst. HOME CARE INSTRUCTIONS  Do not press on the ganglion cyst, poke it with a needle, or hit it.  Take medicines only as directed by your health care provider.  Wear your brace or splint as directed by your health care provider.  Watch your ganglion cyst for any changes.  Keep all follow-up visits as directed by your health care provider. This is important. SEEK MEDICAL CARE IF:  Your ganglion cyst becomes larger or more painful.  You have increased redness, red streaks, or swelling.  You have pus coming from the lump.  You have weakness or numbness in the affected area.  You have a fever or chills.   This information is not intended to replace advice given to you by your health care provider. Make sure you discuss any questions you have with your health care provider.   Document Released: 08/20/2000 Document Revised: 09/13/2014 Document Reviewed: 02/05/2014 Elsevier Interactive Patient Education [employer].       I personally performed the services described in this documentation, which was scribed in my presence. The recorded information has been reviewed and considered, and addended by me as needed.

## 2016-01-23 ENCOUNTER — Encounter (HOSPITAL_COMMUNITY): Payer: Self-pay | Admitting: Emergency Medicine

## 2016-01-23 ENCOUNTER — Emergency Department (HOSPITAL_COMMUNITY)
Admission: EM | Admit: 2016-01-23 | Discharge: 2016-01-23 | Disposition: A | Discharge status: Home / Self Care | Payer: Managed Care, Other (non HMO) | Attending: Emergency Medicine | Admitting: Emergency Medicine

## 2016-01-23 DIAGNOSIS — K029 Dental caries, unspecified: Secondary | ICD-10-CM | POA: Insufficient documentation

## 2016-01-23 DIAGNOSIS — I252 Old myocardial infarction: Secondary | ICD-10-CM | POA: Diagnosis not present

## 2016-01-23 DIAGNOSIS — R51 Headache: Secondary | ICD-10-CM | POA: Insufficient documentation

## 2016-01-23 DIAGNOSIS — Z79899 Other long term (current) drug therapy: Secondary | ICD-10-CM | POA: Diagnosis not present

## 2016-01-23 DIAGNOSIS — K047 Periapical abscess without sinus: Secondary | ICD-10-CM | POA: Diagnosis not present

## 2016-01-23 DIAGNOSIS — I10 Essential (primary) hypertension: Secondary | ICD-10-CM | POA: Diagnosis not present

## 2016-01-23 DIAGNOSIS — K0889 Other specified disorders of teeth and supporting structures: Secondary | ICD-10-CM | POA: Diagnosis present

## 2016-01-23 MED ORDER — HYDROCODONE-ACETAMINOPHEN 5-325 MG PO TABS: 1.0000 | ORAL_TABLET | Freq: Four times a day (QID) | ORAL | Status: DC | PRN

## 2016-01-23 MED ORDER — DOXYCYCLINE HYCLATE 100 MG PO CAPS: 100.0000 mg | ORAL_CAPSULE | Freq: Two times a day (BID) | ORAL | Status: DC

## 2016-01-23 NOTE — Discharge Instructions (Signed)
Apply warm compresses to jaw throughout the day. Swish with warm salty water twice daily. Take antibiotic until finished. Use motrin or norco as needed for pain but don't drive while taking norco. Followup with a dentist is very important for ongoing evaluation and management of recurrent dental pain, use the list below or the doctor listed above to find a dentist. Return to emergency department for emergent changing or worsening symptoms.   Dental Abscess A dental abscess is pus in or around a tooth. HOME CARE  Take medicines only as told by your dentist.  If you were prescribed antibiotic medicine, finish all of it even if you start to feel better.  Rinse your mouth (gargle) often with salt water.  Do not drive or use heavy machinery, like a lawn mower, while taking pain medicine.  Do not apply heat to the outside of your mouth.  Keep all follow-up visits as told by your dentist. This is important. GET HELP IF:  Your pain is worse, and medicine does not help. GET HELP RIGHT AWAY IF:  You have a fever or chills.  Your symptoms suddenly get worse.  You have a very bad headache.  You have problems breathing or swallowing.  You have trouble opening your mouth.  You have puffiness (swelling) in your neck or around your eye.   This information is not intended to replace advice given to you by your health care provider. Make sure you discuss any questions you have with your health care provider.   Document Released: 01/07/2015 Document Reviewed: 01/07/2015 Elsevier Interactive Patient Education 2016 [place] Pain Dental pain may be caused by many things, including:  Tooth decay (cavities or caries). Cavities expose the nerve of your tooth to air and hot or cold temperatures. This can cause pain or discomfort.  Abscess or infection. A dental abscess is a collection of infected pus from a bacterial infection in the inner part of the tooth (pulp). It usually occurs  at the end of the tooth's root.  Injury.  An unknown reason (idiopathic). Your pain may be mild or severe. It may only occur when:  You are chewing.  You are exposed to hot or cold temperature.  You are eating or drinking sugary foods or beverages, such as soda or candy. Your pain may also be constant. HOME CARE INSTRUCTIONS Watch your dental pain for any changes. The following actions may help to lessen any discomfort that you are feeling:  Take medicines only as directed by your dentist.  If you were prescribed an antibiotic medicine, finish all of it even if you start to feel better.  Keep all follow-up visits as directed by your dentist. This is important.  Do not apply heat to the outside of your face.  Rinse your mouth or gargle with salt water if directed by your dentist. This helps with pain and swelling.  You can make salt water by adding  tsp of salt to 1 cup of warm water.  Apply ice to the painful area of your face:  Put ice in a plastic bag.  Place a towel between your skin and the bag.  Leave the ice on for 20 minutes, 2-3 times per day.  Avoid foods or drinks that cause you pain, such as:  Very hot or very cold foods or drinks.  Sweet or sugary foods or drinks. SEEK MEDICAL CARE IF:  Your pain is not controlled with medicines.  Your symptoms are worse.  You have  new symptoms. SEEK IMMEDIATE MEDICAL CARE IF:  You are unable to open your mouth.  You are having trouble breathing or swallowing.  You have a fever.  Your face, neck, or jaw is swollen.   This information is not intended to replace advice given to you by your health care provider. Make sure you discuss any questions you have with your health care provider.   Document Released: 08/23/2005 Document Revised: 01/07/2015 Document Reviewed: 08/19/2014 Elsevier Interactive Patient Education 2016 [employer].    Emergency Department Resource Guide 1) Find a Doctor and Pay Out of  Pocket Although you won't have to find out who is covered by your insurance plan, it is a good idea to ask around and get recommendations. You will then need to call the office and see if the doctor you have chosen will accept you as a new patient and what types of options they offer for patients who are self-pay. Some doctors offer discounts or will set up payment plans for their patients who do not have insurance, but you will need to ask so you aren't surprised when you get to your appointment.  2) Contact Your Local Health Department Not all health departments have doctors that can see patients for sick visits, but many do, so it is worth a call to see if yours does. If you don't know where your local health department is, you can check in your phone book. The CDC also has a tool to help you locate your state's health department, and many state websites also have listings of all of their local health departments.  3) Find a [facility] If your illness is not likely to be very severe or complicated, you may want to try a walk in clinic. These are popping up all over the country in pharmacies, drugstores, and shopping centers. They're usually staffed by nurse practitioners or physician assistants that have been trained to treat common illnesses and complaints. They're usually fairly quick and inexpensive. However, if you have serious medical issues or chronic medical problems, these are probably not your best option.  No Primary Care Doctor: - Call Health Connect at  [phone] - they can help you locate a primary care doctor that  accepts your insurance, provides certain services, etc. - Physician Referral Service- [phone]  Chronic Pain Problems: Organization         Address  Phone   Notes  [facility]  [phone] Patients need to be referred by their primary care doctor.   Medication Assistance: Organization         Address  Phone   Notes  [facility]  Medication [facility] [address] [place], [place] [address] [phone] --Must be a resident of [facility] -- Must have NO insurance coverage whatsoever (no Medicaid/ Medicare, etc.) -- The pt. MUST have a primary care doctor that directs their care regularly and follows them in the community   [facility]  [phone]   [place]  [phone]     Dental Care: Organization         Address  Phone  Notes  [facility] Department of [facility] [phone] Accepts children up to age 83 who are enrolled in [place] or [place]; pregnant women with a Medicaid card; and children who have applied for Medicaid or [place] Health Choice, but were declined, whose parents can pay a reduced fee  at time of service.  [facility] Department of [facility]  [address], [place] [phone] Accepts children up to age 65 who are enrolled in [place] or [place]; pregnant women with a Medicaid card; and children who have applied for Medicaid or [place] Health Choice, but were declined, whose parents can pay a reduced fee at time of service.  [facility]  [place] [phone] Patients are seen by appointment only. Walk-ins are not accepted. [place] will see patients 80 years of age and older. Monday - Tuesday (8am-5pm) Most Wednesdays (8:30-5pm) $30 per visit, cash only  [facility] Adult [facility]  [address], [facility] [phone] Patients are seen by appointment only. Walk-ins are not accepted. [place] will see patients 37 years of age and older. One Wednesday Evening (Monthly: Volunteer Based).  $30 per visit, cash only  [place]  [phone] for adults; Children under age 52, call Graduate Pediatric Dentistry at [phone]. Children aged 4-14, please call  [phone] to request a pediatric application.  Dental services are provided in all areas of dental care including fillings, crowns and bridges, complete and partial dentures, implants, gum treatment, root canals, and extractions. Preventive care is also provided. Treatment is provided to both adults and children. Patients are selected via a lottery and there is often a waiting list.   [facility] [address] [place]  [phone] [email or web address]   [facility] [address] [place], [place] [phone], Ext. [phone] Second and Fourth Thursday of each month, opens at 6:30 AM; Clinic ends at 9 AM.  Patients are seen on a first-come first-served basis, and a limited number are seen during each clinic.   [facility]  [address] [place], [place] [phone]   Eligibility Requirements You must have lived in [place], [place], or [place] counties for at least the last three months.   You cannot be eligible for state or federal sponsored [employer], including [employer], [place], or [employer].   You generally cannot be eligible for healthcare insurance through your employer.    How to apply: Eligibility screenings are held every Tuesday and Wednesday afternoon from 1:00 pm until 4:00 pm. You do not need an appointment for the interview!  [facility] [address], [place], [place]   [place]  [phone]

## 2016-01-23 NOTE — ED Provider Notes (Signed)
CSN: [id number]     Arrival date & time 01/23/16  1640 History  By signing my name below, I, Rowan Blase, attest that this documentation has been prepared under the direction and in the presence of non-physician practitioner, Hayli Milligan Camprubi-Soms, PA-C. Electronically Signed: Rowan Blase, Scribe. 01/23/2016. 5:30 PM.   Chief Complaint  Patient presents with  . Abscess  . Facial Pain    sinus pain   Patient is a 50 y.o. male presenting with tooth pain. The history is provided by the patient. No language interpreter was used.  Dental Pain Location:  Lower Lower teeth location:  22/LL cuspid Quality:  Sharp Severity:  Moderate Onset quality:  Gradual Duration:  3 days Timing:  Constant Progression:  Unchanged Chronicity:  New Context: abscess   Relieved by:  None tried Worsened by:  Pressure Ineffective treatments:  None tried Associated symptoms: fever (subjective) and gum swelling   Associated symptoms: no drooling, no facial swelling, no neck pain, no neck swelling, no oral bleeding and no trismus   Risk factors: no diabetes, sufficient dental care and no smoking    HPI Comments:  DOV DILL is a 50 y.o. male with PMHx of HTN and MI, who presents to the Emergency Department complaining of 7/10 constant, sharp non-radiating painful dental abscess on lower left side beginning 3-4 days ago.  He notes the tooth is chipped and he saw drainage a week ago but the pain didn't start until 3-4 days ago. Pain is worsened with palpation or drinking fluids. No alleviating factors noted or treatments attempted PTA. Pt reports associated subjective fever and chills earlier today. Pt has a dentist currently but hasn't seen them yet. Thinks he has an abscess in the tooth. Denies gum swelling/ongoing drainage, ear pain, ear drainage, difficulty swallowing, trismus, neck swelling, facial swelling, chest pain, shortness of breath, abdominal pain, nausea, vomiting, diarrhea, constipation,  hematuria, dysuria, myalgias, arthralgias, numbness, tingling, focal weakness, rash, or h/o smoking.   Past Medical History  Diagnosis Date  . Hypertension   . MI (myocardial infarction) [facility])    Past Surgical History  Procedure Laterality Date  . Hernia repair    . Tooth extraction Right 04/21/2013    Procedure: EXTRACTION MOLARS;  Surgeon: [name], DDS;  Location: [place];  Service: Oral Surgery;  Laterality: Right;  . Eye removed    . Intraocular prostheses insertion     Family History  Problem Relation Age of Onset  . Hypertension Father   . Depression Brother    Social History  Substance Use Topics  . Smoking status: Never Smoker   . Smokeless tobacco: None  . Alcohol Use: No    Review of Systems  Constitutional: Positive for fever (subjective) and chills.  HENT: Positive for dental problem. Negative for drooling, ear discharge, ear pain, facial swelling and trouble swallowing.   Respiratory: Negative for shortness of breath.   Cardiovascular: Negative for chest pain.  Gastrointestinal: Negative for nausea, vomiting, abdominal pain, diarrhea and constipation.  Genitourinary: Negative for dysuria and hematuria.  Musculoskeletal: Negative for myalgias, arthralgias and neck pain.  Skin: Negative for color change.  Allergic/Immunologic: Negative for immunocompromised state.  Neurological: Negative for weakness and numbness.  Psychiatric/Behavioral: Negative for confusion.  10 Systems reviewed and all are negative for acute change except as noted in the HPI.  Allergies  Pollen extract  Home Medications   Prior to Admission medications   Medication Sig Start Date End Date Taking? Authorizing Provider  acetaminophen (TYLENOL)  500 MG tablet Take 500 mg by mouth every 6 (six) hours as needed for mild pain, moderate pain or headache. Reported on 12/07/2015    Historical Provider, MD  amoxicillin (AMOXIL) 500 MG capsule Take 1 capsule (500 mg total) by mouth 3 (three)  times daily. Patient not taking: Reported on 12/07/2015 10/12/15   Robyn Haber, MD  cloNIDine (CATAPRES - DOSED IN MG/24 HR) 0.2 mg/24hr patch APPLY 1 PATCH ONTO THE SKIN ONCE A WEEK. 10/12/15   Robyn Haber, MD  doxycycline (VIBRAMYCIN) 100 MG capsule Take 1 capsule (100 mg total) by mouth 2 (two) times daily. One po bid x 7 days 01/23/16   Alitzel Cookson Camprubi-Soms, PA-C  fluticasone (FLONASE) 50 MCG/ACT nasal spray PLACE 2 SPRAYS INTO BOTH NOSTRILS DAILY. 07/22/14   Thao P Le, DO  hydrALAZINE (APRESOLINE) 25 MG tablet Take 1 tablet (25 mg total) by mouth 2 (two) times daily. 10/12/15   Robyn Haber, MD  HYDROcodone-acetaminophen (NORCO) 5-325 MG tablet Take 1 tablet by mouth every 6 (six) hours as needed for severe pain. 01/23/16   Anthea Udovich Camprubi-Soms, PA-C  hydrOXYzine (VISTARIL) 100 MG capsule TAKE ONE CAPSULE BY MOUTH AT BEDTIME AS NEEDED FOR ALLERGY 10/12/15   Robyn Haber, MD  losartan-hydrochlorothiazide [facility]) 100-25 MG tablet Take 1 tablet by mouth every day 10/12/15   Robyn Haber, MD  metoprolol succinate (TOPROL-XL) 25 MG 24 hr tablet TAKE 1 TABLET BY MOUTH EVERY DAY. 10/12/15   Robyn Haber, MD  oxyCODONE-acetaminophen (PERCOCET/ROXICET) 5-325 MG per tablet 1 to 2 tabs PO q6hrs  PRN for pain 03/11/15   Elmyra Ricks Pisciotta, PA-C  spironolactone (ALDACTONE) 25 MG tablet Take 1 tablet (25 mg total) by mouth daily. 10/12/15   Robyn Haber, MD   BP 133/85 mmHg  Pulse 78  Temp(Src) 98.2 F (36.8 C) (Oral)  Resp 16  SpO2 95%   Physical Exam  Constitutional: He is oriented to person, place, and time. Vital signs are normal. He appears well-developed and well-nourished.  Non-toxic appearance. No distress.  Afebrile, nontoxic, NAD  HENT:  Head: Normocephalic and atraumatic.  Nose: Nose normal.  Mouth/Throat: Uvula is midline, oropharynx is clear and moist and mucous membranes are normal. No trismus in the jaw. Dental abscesses present. No uvula swelling.  Left lower bicuspid number  22 missing with gingiva, erythema and swelling, indurated abscess without fluctuance noted to the gumline, slightly tender, with no evidence of Ludwig's. Diffuse dental decay. Nose clear. Oropharynx clear and moist, without uvular swelling or deviation, no trismus or drooling, no tonsillar swelling or erythema, no exudates.   Eyes: Conjunctivae and EOM are normal. Right eye exhibits no discharge. Left eye exhibits no discharge.  Neck: Normal range of motion. Neck supple.  Cardiovascular: Normal rate.   Pulmonary/Chest: Effort normal. No respiratory distress.  Abdominal: Normal appearance. He exhibits no distension.  Musculoskeletal: Normal range of motion.  Neurological: He is alert and oriented to person, place, and time. He has normal strength. No sensory deficit.  Skin: Skin is warm, dry and intact. No rash noted.  Psychiatric: He has a normal mood and affect.  Nursing note and vitals reviewed.   ED Course  Procedures  DIAGNOSTIC STUDIES:  Oxygen Saturation is 95% on RA, adequate by my interpretation.    COORDINATION OF CARE:  5:21 PM Will start on antibiotics. Discussed treatment plan with pt at bedside and pt agreed to plan.  Labs Review Labs Reviewed - No data to display  Imaging Review No results found. I have personally  reviewed and evaluated these images and lab results as part of my medical decision-making.   EKG Interpretation None      MDM   Final diagnoses:  Dental abscess  Dental decay    50 y.o. male here with Dental pain associated with dental infection and dental abscess with patient afebrile, non toxic appearing and swallowing secretions well. Abscess is indurated, no fluctuance, doubt we can I&D today. I stressed the importance of dental follow up for ultimate management of dental pain.  I have also discussed reasons to return immediately to the ER.  Patient expresses understanding and agrees with plan.  I will also give doxycycline and pain control.   I  personally performed the services described in this documentation, which was scribed in my presence. The recorded information has been reviewed and is accurate.  BP 133/85 mmHg  Pulse 78  Temp(Src) 98.2 F (36.8 C) (Oral)  Resp 16  SpO2 95%  Meds ordered this encounter  Medications  . doxycycline (VIBRAMYCIN) 100 MG capsule    Sig: Take 1 capsule (100 mg total) by mouth 2 (two) times daily. One po bid x 7 days    Dispense:  14 capsule    Refill:  0    Order Specific Question:  Supervising Provider    Answer:  MILLER, BRIAN [[id number]]  . HYDROcodone-acetaminophen (NORCO) 5-325 MG tablet    Sig: Take 1 tablet by mouth every 6 (six) hours as needed for severe pain.    Dispense:  10 tablet    Refill:  0    Order Specific Question:  Supervising Provider    Answer:  Noemi Chapel [[id number]]      Kenrick Pore Camprubi-Soms, PA-C 01/23/16 1739

## 2016-01-23 NOTE — ED Notes (Signed)
Pt from home with complaints of sinus pain related to his allergies that he rates at 8/10. Pt states this has been persistent x 2 weeks. Pt also has complaints of an abscess on the inside of his left lip that has had grayish drainage about 10 days ago, "then swelled up again". Pt states the pain in his this is about a 3/10. Pt states he has felt feverish at home, but is afebrile at time of assessment

## 2016-01-31 ENCOUNTER — Emergency Department (HOSPITAL_COMMUNITY)
Admission: EM | Admit: 2016-01-31 | Discharge: 2016-01-31 | Disposition: A | Discharge status: Home / Self Care | Payer: PRIVATE HEALTH INSURANCE | Attending: Emergency Medicine | Admitting: Emergency Medicine

## 2016-01-31 ENCOUNTER — Encounter (HOSPITAL_COMMUNITY): Payer: Self-pay | Admitting: Emergency Medicine

## 2016-01-31 DIAGNOSIS — L259 Unspecified contact dermatitis, unspecified cause: Secondary | ICD-10-CM | POA: Diagnosis not present

## 2016-01-31 DIAGNOSIS — K029 Dental caries, unspecified: Secondary | ICD-10-CM | POA: Diagnosis not present

## 2016-01-31 DIAGNOSIS — I10 Essential (primary) hypertension: Secondary | ICD-10-CM | POA: Insufficient documentation

## 2016-01-31 DIAGNOSIS — K047 Periapical abscess without sinus: Secondary | ICD-10-CM | POA: Insufficient documentation

## 2016-01-31 DIAGNOSIS — Z79899 Other long term (current) drug therapy: Secondary | ICD-10-CM | POA: Diagnosis not present

## 2016-01-31 DIAGNOSIS — Z792 Long term (current) use of antibiotics: Secondary | ICD-10-CM | POA: Diagnosis not present

## 2016-01-31 DIAGNOSIS — R21 Rash and other nonspecific skin eruption: Secondary | ICD-10-CM | POA: Diagnosis present

## 2016-01-31 DIAGNOSIS — I252 Old myocardial infarction: Secondary | ICD-10-CM | POA: Diagnosis not present

## 2016-01-31 MED ORDER — HYDROXYZINE HCL 25 MG PO TABS: 25.0000 mg | ORAL_TABLET | Freq: Four times a day (QID) | ORAL | Status: DC

## 2016-01-31 MED ORDER — PREDNISONE 20 MG PO TABS: 40.0000 mg | ORAL_TABLET | Freq: Every day | ORAL | Status: DC

## 2016-01-31 NOTE — ED Provider Notes (Signed)
CSN: [id number]     Arrival date & time 01/31/16  1806 History   First MD Initiated Contact with Patient 01/31/16 1822     Chief Complaint  Patient presents with  . Rash     (Consider location/radiation/quality/duration/timing/severity/associated sxs/prior Treatment) HPI   Patient is a 50 year old male past medical history of hypertension who presents the ED with complaint of rash, onset 3 days. Patient reports having worsening diffuse red itchy rash to his torso, back, arms and legs which she noticed 3 days ago. Patient reports using new body wash 4 days ago. He also notes he borrowed bed linens from a friend 8 days ago which he has been using, he notes he did not wash them prior to use. Patient also reports he is currently on doxycycline for dental abscess, patient reports starting antibiotic 8 days ago. Denies fever, chills, headache, facial/neck swelling, dysphasia, shortness of breath, nausea/vomiting, abdominal pain, drainage, numbness, tingling, weakness. Patient reports he works outside [employer] work but notes he wears pants, closed toed shoes and t-shirts. Denies any known contact with poison ivy/poison oak. Patient denies any other new soaps, lotions, detergents or medications. Patient reports no other roommates have similar rash.  Past Medical History  Diagnosis Date  . Hypertension   . MI (myocardial infarction) [facility])    Past Surgical History  Procedure Laterality Date  . Hernia repair    . Tooth extraction Right 04/21/2013    Procedure: EXTRACTION MOLARS;  Surgeon: [name], DDS;  Location: [place];  Service: Oral Surgery;  Laterality: Right;  . Eye removed    . Intraocular prostheses insertion     Family History  Problem Relation Age of Onset  . Hypertension Father   . Depression Brother    Social History  Substance Use Topics  . Smoking status: Never Smoker   . Smokeless tobacco: None  . Alcohol Use: No    Review of Systems  Skin: Positive for rash.  All  other systems reviewed and are negative.     Allergies  Pollen extract  Home Medications   Prior to Admission medications   Medication Sig Start Date End Date Taking? Authorizing Provider  acetaminophen (TYLENOL) 500 MG tablet Take 500 mg by mouth every 6 (six) hours as needed for mild pain, moderate pain or headache. Reported on 12/07/2015    Historical Provider, MD  amoxicillin (AMOXIL) 500 MG capsule Take 1 capsule (500 mg total) by mouth 3 (three) times daily. Patient not taking: Reported on 12/07/2015 10/12/15   Robyn Haber, MD  cloNIDine (CATAPRES - DOSED IN MG/24 HR) 0.2 mg/24hr patch APPLY 1 PATCH ONTO THE SKIN ONCE A WEEK. 10/12/15   Robyn Haber, MD  doxycycline (VIBRAMYCIN) 100 MG capsule Take 1 capsule (100 mg total) by mouth 2 (two) times daily. One po bid x 7 days 01/23/16   Mercedes Camprubi-Soms, PA-C  fluticasone (FLONASE) 50 MCG/ACT nasal spray PLACE 2 SPRAYS INTO BOTH NOSTRILS DAILY. 07/22/14   Thao P Le, DO  hydrALAZINE (APRESOLINE) 25 MG tablet Take 1 tablet (25 mg total) by mouth 2 (two) times daily. 10/12/15   Robyn Haber, MD  HYDROcodone-acetaminophen (NORCO) 5-325 MG tablet Take 1 tablet by mouth every 6 (six) hours as needed for severe pain. 01/23/16   Mercedes Camprubi-Soms, PA-C  hydrOXYzine (ATARAX/VISTARIL) 25 MG tablet Take 1 tablet (25 mg total) by mouth every 6 (six) hours. 01/31/16   Nona Dell, PA-C  hydrOXYzine (VISTARIL) 100 MG capsule TAKE ONE CAPSULE BY MOUTH  AT BEDTIME AS NEEDED FOR ALLERGY 10/12/15   Robyn Haber, MD  losartan-hydrochlorothiazide [facility]) 100-25 MG tablet Take 1 tablet by mouth every day 10/12/15   Robyn Haber, MD  metoprolol succinate (TOPROL-XL) 25 MG 24 hr tablet TAKE 1 TABLET BY MOUTH EVERY DAY. 10/12/15   Robyn Haber, MD  oxyCODONE-acetaminophen (PERCOCET/ROXICET) 5-325 MG per tablet 1 to 2 tabs PO q6hrs  PRN for pain 03/11/15   Elmyra Ricks Pisciotta, PA-C  predniSONE (DELTASONE) 20 MG tablet Take 2 tablets (40 mg total)  by mouth daily. 01/31/16   Nona Dell, PA-C  spironolactone (ALDACTONE) 25 MG tablet Take 1 tablet (25 mg total) by mouth daily. 10/12/15   Robyn Haber, MD   BP 141/97 mmHg  Pulse 77  Temp(Src) 98.2 F (36.8 C) (Oral)  Resp 20  SpO2 97% Physical Exam  Constitutional: He is oriented to person, place, and time. He appears well-developed and well-nourished. No distress.  HENT:  Head: Normocephalic and atraumatic.  Mouth/Throat: Uvula is midline, oropharynx is clear and moist and mucous membranes are normal. No oral lesions. No trismus in the jaw. Normal dentition. Dental abscesses and dental caries present. No uvula swelling. No oropharyngeal exudate, posterior oropharyngeal edema, posterior oropharyngeal erythema or tonsillar abscesses.    Eyes: Conjunctivae and EOM are normal. Right eye exhibits no discharge. Left eye exhibits no discharge. No scleral icterus.  Neck: Normal range of motion. Neck supple.  Cardiovascular: Normal rate.   Pulmonary/Chest: Effort normal. No respiratory distress.  Abdominal: Soft. He exhibits no distension. There is no tenderness.  Musculoskeletal: Normal range of motion. He exhibits no edema or tenderness.  Lymphadenopathy:    He has no cervical adenopathy.  Neurological: He is alert and oriented to person, place, and time.  Skin: Skin is warm and dry. Rash noted. He is not diaphoretic.  Diffuse erythematous papules noted to torso, back, BUE and BLE. No vesicles, pustules, bulla, burrows, drainage noted. No surrounding swelling. No lesions noted to palms or soles. Excoriations present.  Nursing note and vitals reviewed.   ED Course  Procedures (including critical care time) Labs Review Labs Reviewed - No data to display  Imaging Review No results found. I have personally reviewed and evaluated these images and lab results as part of my medical decision-making.   EKG Interpretation None      MDM   Final diagnoses:  Rash   Rash  consistent with contact dermatitis from suspected body wash or borrowed linens. Patient denies any difficulty breathing or swallowing.  Pt has a patent airway without stridor and is handling secretions without difficulty; no angioedema. No blisters, no pustules, no warmth, no draining sinus tracts, no superficial abscesses, no bullous impetigo, no vesicles, no desquamation, no target lesions with dusky purpura or a central bulla. Not tender to touch. No concern for superimposed infection. No concern for SJS, TEN, TSS, tick borne illness, syphilis or other life-threatening condition. Will discharge home with short course of steroids, vistaril and recommend Benadryl as needed for pruritis.  Patient reports dental abscesses has improved since initiation of antibiotics. Exam unconcerning for Ludwig's angina or spread of infection. Advised patient to finish his prescription of doxycycline. Patient reports having resources for outpatient dental follow-up. I advised patient to follow up with dentist at earliest appointment. Strict return precautions with patient.     Chesley Noon [place], [place] 01/31/16 1849  Leo Grosser, MD 02/01/16 [phone]

## 2016-01-31 NOTE — ED Notes (Signed)
Pt states he woke up Thursday morning with rash to torso and arms. No home treatment. No changes to lifestyle.

## 2016-01-31 NOTE — Discharge Instructions (Signed)
Take your medications as prescribed as needed for rash and itchiness. Take your full prescription of prednisone as prescribed. He may also take Benadryl as prescribed over-the-counter as needed for relief of itching. I recommend to stop using the new shower gel you recently started, resume using old shower jelly you have used in the past while bathing. I also recommend washing your linens that you are borrowing from a friend with detergents that you have used in the past without any known rash. Follow-up with your primary care provider in the next 3-4 days if your rash has not improved. Please return to the Emergency Department if symptoms worsen or new onset of fever, facial/neck swelling, difficulty swallowing, difficulty breathing, oral lesions, abdominal pain, vomiting, drainage, numbness, tingling, weakness.

## 2016-03-17 ENCOUNTER — Emergency Department (HOSPITAL_COMMUNITY)
Admission: EM | Admit: 2016-03-17 | Discharge: 2016-03-17 | Disposition: A | Discharge status: Home / Self Care | Payer: Managed Care, Other (non HMO) | Attending: Emergency Medicine | Admitting: Emergency Medicine

## 2016-03-17 ENCOUNTER — Encounter (HOSPITAL_COMMUNITY): Payer: Self-pay

## 2016-03-17 DIAGNOSIS — K0889 Other specified disorders of teeth and supporting structures: Secondary | ICD-10-CM | POA: Diagnosis present

## 2016-03-17 DIAGNOSIS — K047 Periapical abscess without sinus: Secondary | ICD-10-CM

## 2016-03-17 DIAGNOSIS — Z7952 Long term (current) use of systemic steroids: Secondary | ICD-10-CM | POA: Diagnosis not present

## 2016-03-17 DIAGNOSIS — Z7951 Long term (current) use of inhaled steroids: Secondary | ICD-10-CM | POA: Insufficient documentation

## 2016-03-17 DIAGNOSIS — I252 Old myocardial infarction: Secondary | ICD-10-CM | POA: Diagnosis not present

## 2016-03-17 DIAGNOSIS — I10 Essential (primary) hypertension: Secondary | ICD-10-CM | POA: Diagnosis not present

## 2016-03-17 DIAGNOSIS — Z792 Long term (current) use of antibiotics: Secondary | ICD-10-CM | POA: Diagnosis not present

## 2016-03-17 DIAGNOSIS — Z79899 Other long term (current) drug therapy: Secondary | ICD-10-CM | POA: Diagnosis not present

## 2016-03-17 MED ORDER — PENICILLIN V POTASSIUM 500 MG PO TABS: 500.0000 mg | ORAL_TABLET | Freq: Four times a day (QID) | ORAL | Status: DC

## 2016-03-17 MED ORDER — NAPROXEN 500 MG PO TABS: 500.0000 mg | ORAL_TABLET | Freq: Two times a day (BID) | ORAL | Status: DC

## 2016-03-17 NOTE — ED Provider Notes (Signed)
CSN: [id number]     Arrival date & time 03/17/16  1334 History  By signing my name below, I, Dyke Brackett, attest that this documentation has been prepared under the direction and in the presence of non-physician practitioner, Quincy Carnes, PA-C Electronically Signed: Dyke Brackett, Scribe. 03/17/2016. 1:54 PM.   Chief Complaint  Patient presents with  . Dental Pain  . Abscess   The history is provided by the patient. No language interpreter was used.    HPI Comments:  Dylan Gonzalez is a 50 y.o. male who presents to the Emergency Department complaining of moderate, sudden onset, constant lower dental pain onset three days ago. He notes associated drainage yesterday from the area. Pt has a general dentist, but has not seen them recently due to cost. No alleviating factors noted. He denies fever.  No meds taken PTA.  VSS.  Past Medical History  Diagnosis Date  . Hypertension   . MI (myocardial infarction) [facility])    Past Surgical History  Procedure Laterality Date  . Hernia repair    . Tooth extraction Right 04/21/2013    Procedure: EXTRACTION MOLARS;  Surgeon: [name], DDS;  Location: [place];  Service: Oral Surgery;  Laterality: Right;  . Eye removed    . Intraocular prostheses insertion     Family History  Problem Relation Age of Onset  . Hypertension Father   . Depression Brother    Social History  Substance Use Topics  . Smoking status: Never Smoker   . Smokeless tobacco: Not on file  . Alcohol Use: No    Review of Systems  Constitutional: Negative for fever.  HENT: Positive for dental problem.   All other systems reviewed and are negative.   Allergies  Pollen extract  Home Medications   Prior to Admission medications   Medication Sig Start Date End Date Taking? Authorizing Provider  acetaminophen (TYLENOL) 500 MG tablet Take 500 mg by mouth every 6 (six) hours as needed for mild pain, moderate pain or headache. Reported on 12/07/2015    Historical Provider,  MD  amoxicillin (AMOXIL) 500 MG capsule Take 1 capsule (500 mg total) by mouth 3 (three) times daily. Patient not taking: Reported on 12/07/2015 10/12/15   Robyn Haber, MD  cloNIDine (CATAPRES - DOSED IN MG/24 HR) 0.2 mg/24hr patch APPLY 1 PATCH ONTO THE SKIN ONCE A WEEK. 10/12/15   Robyn Haber, MD  doxycycline (VIBRAMYCIN) 100 MG capsule Take 1 capsule (100 mg total) by mouth 2 (two) times daily. One po bid x 7 days 01/23/16   Mercedes Camprubi-Soms, PA-C  fluticasone (FLONASE) 50 MCG/ACT nasal spray PLACE 2 SPRAYS INTO BOTH NOSTRILS DAILY. 07/22/14   Thao P Le, DO  hydrALAZINE (APRESOLINE) 25 MG tablet Take 1 tablet (25 mg total) by mouth 2 (two) times daily. 10/12/15   Robyn Haber, MD  HYDROcodone-acetaminophen (NORCO) 5-325 MG tablet Take 1 tablet by mouth every 6 (six) hours as needed for severe pain. 01/23/16   Mercedes Camprubi-Soms, PA-C  hydrOXYzine (ATARAX/VISTARIL) 25 MG tablet Take 1 tablet (25 mg total) by mouth every 6 (six) hours. 01/31/16   Nona Dell, PA-C  hydrOXYzine (VISTARIL) 100 MG capsule TAKE ONE CAPSULE BY MOUTH AT BEDTIME AS NEEDED FOR ALLERGY 10/12/15   Robyn Haber, MD  losartan-hydrochlorothiazide [facility]) 100-25 MG tablet Take 1 tablet by mouth every day 10/12/15   Robyn Haber, MD  metoprolol succinate (TOPROL-XL) 25 MG 24 hr tablet TAKE 1 TABLET BY MOUTH EVERY DAY. 10/12/15  Robyn Haber, MD  oxyCODONE-acetaminophen (PERCOCET/ROXICET) 5-325 MG per tablet 1 to 2 tabs PO q6hrs  PRN for pain 03/11/15   Elmyra Ricks Pisciotta, PA-C  predniSONE (DELTASONE) 20 MG tablet Take 2 tablets (40 mg total) by mouth daily. 01/31/16   Nona Dell, PA-C  spironolactone (ALDACTONE) 25 MG tablet Take 1 tablet (25 mg total) by mouth daily. 10/12/15   Robyn Haber, MD   BP 148/94 mmHg  Pulse 80  Temp(Src) 98.2 F (36.8 C) (Oral)  Resp 16  SpO2 96%   Physical Exam  Constitutional: He is oriented to person, place, and time. He appears well-developed and  well-nourished.  HENT:  Head: Normocephalic and atraumatic.  Mouth/Throat: Oropharynx is clear and moist.  Teeth largely in poor dentition, left lower first molar broken with cavity noted, surrounding gingiva swollen, no apparent drainage at this time, handling secretions appropriately, no trismus, no facial or neck swelling, normal phonation without stridor  Eyes: Conjunctivae and EOM are normal. Pupils are equal, round, and reactive to light.  Neck: Normal range of motion.  Cardiovascular: Normal rate, regular rhythm and normal heart sounds.   Pulmonary/Chest: Effort normal and breath sounds normal.  Abdominal: Soft. Bowel sounds are normal.  Musculoskeletal: Normal range of motion.  Neurological: He is alert and oriented to person, place, and time.  Skin: Skin is warm and dry.  Psychiatric: He has a normal mood and affect.  Nursing note and vitals reviewed.   ED Course  Procedures  DIAGNOSTIC STUDIES:  Oxygen Saturation is 96% on RA, adequate by my interpretation.    COORDINATION OF CARE:  1:40 PM Will order penicillin and naproxen. Discussed treatment plan with pt at bedside and pt agreed to plan.  Labs Review Labs Reviewed - No data to display  Imaging Review No results found. I have personally reviewed and evaluated these images and lab results as part of my medical decision-making.   EKG Interpretation None      MDM   Final diagnoses:  Dental abscess   50 year old male here with dental abscess. No facial or neck swelling to suggest Ludwig's angina. He is afebrile and nontoxic. Will start on antibiotics and have him follow-up with his dentist. He was also given dental resource guide if needed.  Discussed plan with patient, he/she acknowledged understanding and agreed with plan of care.  Return precautions given for new or worsening symptoms.  I personally performed the services described in this documentation, which was scribed in my presence. The recorded  information has been reviewed and is accurate.  Larene Pickett, PA-C 03/17/16 [place], MD 03/18/16 1215

## 2016-03-17 NOTE — ED Notes (Signed)
Pt c/o L lower dental pain and abscess r/t a broken tooth x 3 days.  Pain score 7/10. Pt has not been taking anything for pain.  Pt reports being seen previously for same, but has not followed up w/ a dentist.

## 2016-03-17 NOTE — Discharge Instructions (Signed)
Take the prescribed medication as directed. Follow-up with your dentist.  If you cannot get in with them, may try to follow-up with one of the dental clinics below. Return to the ED for new or worsening symptoms.  [facility] [employer] [address] is a great source of information about community services available.  Access by dialing 2-1-1 from anywhere in [place], or by website -  CustodianSupply.fi.   Other Local Resources (Updated 09/2015)  Dental  Care   Services    Phone Number and Address  Cost  [facility] For children 73 - 68 years of age:   Cleaning  Tooth brushing/flossing instruction  Sealants, fillings, crowns  Extractions  Emergency treatment  [phone] [address], [place] [address] Charges based on family income.  Medicaid and some insurance plans accepted.     [facility] - [facility], fillings, crowns  Extractions  Emergency treatment [phone] [address], [place]  Pregnant women 70 years of age or older with a Medicaid card  [facility] - [place]  Cleaning  Sealants, fillings, crowns  Extractions  Emergency treatment [phone] [address] [place], [place] Pregnant women 33 years of age or older with a Medicaid card  [facility] For children 74 - 42 years of age:   Cleaning  Tooth brushing/flossing instruction  Sealants, fillings, crowns  Extractions  Emergency treatment Limited orthodontic services for patients with Medicaid [phone] [address], [place] [address] Medicaid and [facility] Health Choice cover for children up to age 104 and pregnant women.  Parents of children up to age 38 without Medicaid pay a reduced fee at time of service.  [place]  For children 93 - 51 years of age:   Cleaning  Tooth brushing/flossing instruction  Sealants, fillings, crowns  Extractions  Emergency treatment Limited orthodontic services for patients with Medicaid [phone] [place], [place].  Medicaid and [place] Health Choice cover for children up to age 59 and pregnant women.  Parents of children up to age 80 without Medicaid pay a reduced fee.  [facility]  Sealants, fillings, crowns  Extractions  Hours: Tuesdays and Thursdays, 4:15 - 8 pm [phone] [address] [place], [place] [address] Services free of charge to [facility] residents ages 18-64 who do not have health insurance, Medicare, [place], or [place] benefits and fall within federal poverty guidelines  [place] care in addition to primary medical care, nutritional counseling, and pharmacy:  Engineer, drilling, fillings, crowns  Extractions                  [phone] [facility], [place], [place], [place], [place], [place] [facility], [place], [place] [facility] [place], [place], [place], most insurance.  Also provides services available to all with fees adjusted based on ability to pay.    [facility]  Cleaning  Tooth brushing/flossing instruction  Sealants, fillings, crowns  Extractions  Emergency treatment Hours:  Tuesdays, Thursdays, and Fridays from 8 am to 5 pm by appointment only. [phone] [place], [place] [address] [facility] residents with Medicaid (depending on eligibility) and children  with [facility] Health Choice - call for more information.  [facility]  Extractions only  Hours: 2nd and 4th Thursday of each month from 6:30 am - 9 am.   [phone] ext. [address] [place], [place] [address] Ages 99 and older only.  Patients are seen on a first come, first served basis.  [employer]  [employer]  Extractions  Orthodontics  Endodontics  Implants/Crowns/Bridges  Complete and partial dentures [phone] [place], [place] Patients must complete an application for services.  There is often a waiting list.

## 2016-03-18 NOTE — ED Provider Notes (Signed)
Medical screening examination/treatment/procedure(s) were performed by non-physician practitioner and as supervising physician I was immediately available for consultation/collaboration.   EKG Interpretation None       Isla Pence, MD 03/18/16 1430

## 2016-05-02 ENCOUNTER — Encounter (HOSPITAL_COMMUNITY): Payer: Self-pay | Admitting: *Deleted

## 2016-05-02 ENCOUNTER — Emergency Department (HOSPITAL_COMMUNITY)
Admission: EM | Admit: 2016-05-02 | Discharge: 2016-05-02 | Disposition: A | Discharge status: Home / Self Care | Payer: Managed Care, Other (non HMO) | Attending: Emergency Medicine | Admitting: Emergency Medicine

## 2016-05-02 DIAGNOSIS — Z79899 Other long term (current) drug therapy: Secondary | ICD-10-CM | POA: Diagnosis not present

## 2016-05-02 DIAGNOSIS — R0981 Nasal congestion: Secondary | ICD-10-CM | POA: Diagnosis not present

## 2016-05-02 DIAGNOSIS — K047 Periapical abscess without sinus: Secondary | ICD-10-CM | POA: Diagnosis present

## 2016-05-02 DIAGNOSIS — I10 Essential (primary) hypertension: Secondary | ICD-10-CM | POA: Insufficient documentation

## 2016-05-02 DIAGNOSIS — Z791 Long term (current) use of non-steroidal anti-inflammatories (NSAID): Secondary | ICD-10-CM | POA: Insufficient documentation

## 2016-05-02 MED ORDER — AMOXICILLIN 500 MG PO CAPS
500.0000 mg | ORAL_CAPSULE | Freq: Once | ORAL | Status: AC
  Administered 2016-05-02: 500 mg via ORAL
  Filled 2016-05-02: qty 1

## 2016-05-02 MED ORDER — AMOXICILLIN 500 MG PO CAPS: 500.0000 mg | ORAL_CAPSULE | Freq: Three times a day (TID) | ORAL | 0 refills | Status: DC

## 2016-05-02 MED ORDER — LIDOCAINE-EPINEPHRINE 2 %-1:100000 IJ SOLN
INTRAMUSCULAR | Status: AC
  Filled 2016-05-02: qty 1

## 2016-05-02 MED ORDER — LIDOCAINE-EPINEPHRINE 1 %-1:100000 IJ SOLN
20.0000 mL | Freq: Once | INTRAMUSCULAR | Status: AC
  Administered 2016-05-02: 20 mL

## 2016-05-02 NOTE — ED Provider Notes (Signed)
[facility] DEPT Provider Note   CSN: [id number] Arrival date & time: 05/02/16  1106     History   Chief Complaint Chief Complaint  Patient presents with  . Fever    HPI Dylan Gonzalez is a 50 y.o. male.  Patient presents for evaluation of sinus congestion dental abscess. States that for about 5 seconds on Friday night when getting into the shower he felt a tightness in his chest. He states this is clearly only a few seconds and resolved and has not recurred. No difficult breathing. Also states she's had some sinus congestion when he blew his nose a few days ago he had some blood in it. None since. He does not have facial or tooth pain. No sinus pain or pressure. Feels "congestion" in his nose. This morning he felt warm but did not check a temperature. He states his wife wanted him to be checked to make sure "you don't have sinus infection". He also states almost as an afterthought that has a dental abscess as well.  HPI  Past Medical History:  Diagnosis Date  . Hypertension   . MI (myocardial infarction) [facility])     Patient Active Problem List   Diagnosis Date Noted  . Hypertension 12/26/2011  . 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 eye removed    . HERNIA REPAIR    . INTRAOCULAR PROSTHESES INSERTION    . TOOTH EXTRACTION Right 04/21/2013   Procedure: EXTRACTION MOLARS;  Surgeon: [name], DDS;  Location: [facility];  Service: Oral Surgery;  Laterality: Right;       Home Medications    Prior to Admission medications   Medication Sig Start Date End Date Taking? Authorizing Provider  acetaminophen (TYLENOL) 500 MG tablet Take 500 mg by mouth every 6 (six) hours as needed for mild pain, moderate pain or headache. Reported on 12/07/2015    Historical Provider, MD  amoxicillin (AMOXIL) 500 MG capsule Take 1 capsule (500 mg total) by mouth 3 (three) times daily. 05/02/16   Tanna Furry, MD  cloNIDine (CATAPRES -  DOSED IN MG/24 HR) 0.2 mg/24hr patch APPLY 1 PATCH ONTO THE SKIN ONCE A WEEK. 10/12/15   Robyn Haber, MD  doxycycline (VIBRAMYCIN) 100 MG capsule Take 1 capsule (100 mg total) by mouth 2 (two) times daily. One po bid x 7 days 01/23/16   Mercedes Camprubi-Soms, PA-C  fluticasone (FLONASE) 50 MCG/ACT nasal spray PLACE 2 SPRAYS INTO BOTH NOSTRILS DAILY. 07/22/14   Thao P Le, DO  hydrALAZINE (APRESOLINE) 25 MG tablet Take 1 tablet (25 mg total) by mouth 2 (two) times daily. 10/12/15   Robyn Haber, MD  HYDROcodone-acetaminophen (NORCO) 5-325 MG tablet Take 1 tablet by mouth every 6 (six) hours as needed for severe pain. 01/23/16   Mercedes Camprubi-Soms, PA-C  hydrOXYzine (ATARAX/VISTARIL) 25 MG tablet Take 1 tablet (25 mg total) by mouth every 6 (six) hours. 01/31/16   Nona Dell, PA-C  hydrOXYzine (VISTARIL) 100 MG capsule TAKE ONE CAPSULE BY MOUTH AT BEDTIME AS NEEDED FOR ALLERGY 10/12/15   Robyn Haber, MD  losartan-hydrochlorothiazide [facility]) 100-25 MG tablet Take 1 tablet by mouth every day 10/12/15   Robyn Haber, MD  metoprolol succinate (TOPROL-XL) 25 MG 24 hr tablet TAKE 1 TABLET BY MOUTH EVERY DAY. 10/12/15   Robyn Haber, MD  naproxen (NAPROSYN) 500 MG tablet Take 1 tablet (500 mg total) by mouth 2 (two) times daily with a meal. 03/17/16  Larene Pickett, PA-C  oxyCODONE-acetaminophen (PERCOCET/ROXICET) 5-325 MG per tablet 1 to 2 tabs PO q6hrs  PRN for pain 03/11/15   [facility], PA-C  penicillin v potassium (VEETID) 500 MG tablet Take 1 tablet (500 mg total) by mouth 4 (four) times daily. 03/17/16   Larene Pickett, PA-C  predniSONE (DELTASONE) 20 MG tablet Take 2 tablets (40 mg total) by mouth daily. 01/31/16   Nona Dell, PA-C  spironolactone (ALDACTONE) 25 MG tablet Take 1 tablet (25 mg total) by mouth daily. 10/12/15   Robyn Haber, MD    Family History Family History  Problem Relation Age of Onset  . Hypertension Father   . Depression Brother      Social History Social History  Substance Use Topics  . Smoking status: Never Smoker  . Smokeless tobacco: Never Used  . Alcohol use No     Allergies   Pollen extract   Review of Systems Review of Systems  Constitutional: Negative for appetite change, chills, diaphoresis, fatigue and fever.  HENT: Positive for congestion, dental problem, nosebleeds and rhinorrhea. Negative for mouth sores, sinus pressure, sore throat and trouble swallowing.   Eyes: Negative for visual disturbance.  Respiratory: Negative for cough, chest tightness, shortness of breath and wheezing.   Cardiovascular: Negative for chest pain.  Gastrointestinal: Negative for abdominal distention, abdominal pain, diarrhea, nausea and vomiting.  Endocrine: Negative for polydipsia, polyphagia and polyuria.  Genitourinary: Negative for dysuria, frequency and hematuria.  Musculoskeletal: Negative for gait problem.  Skin: Negative for color change, pallor and rash.  Neurological: Negative for dizziness, syncope, light-headedness and headaches.  Hematological: Does not bruise/bleed easily.  Psychiatric/Behavioral: Negative for behavioral problems and confusion.     Physical Exam Updated Vital Signs BP 145/98 (BP Location: Right Arm)   Pulse 80   Temp 98.3 F (36.8 C) (Oral)   Ht '5\' 11"'$  (1.803 m)   Wt 220 lb (99.8 kg)   SpO2 98%   BMI 30.68 kg/m   Physical Exam  Constitutional: He is oriented to person, place, and time. He appears well-developed and well-nourished. No distress.  HENT:  Head: Normocephalic.  Mouth/Throat:    Eyes: Conjunctivae are normal. Pupils are equal, round, and reactive to light. No scleral icterus.  Neck: Normal range of motion. Neck supple. No thyromegaly present.  Cardiovascular: Normal rate and regular rhythm.  Exam reveals no gallop and no friction rub.   No murmur heard. Pulmonary/Chest: Effort normal and breath sounds normal. No respiratory distress. He has no wheezes. He  has no rales.  Abdominal: Soft. Bowel sounds are normal. He exhibits no distension. There is no tenderness. There is no rebound.  Musculoskeletal: Normal range of motion.  Neurological: He is alert and oriented to person, place, and time.  Skin: Skin is warm and dry. No rash noted.  Psychiatric: He has a normal mood and affect. His behavior is normal.     ED Treatments / Results  Labs (all labs ordered are listed, but only abnormal results are displayed) Labs Reviewed - No data to display  EKG  EKG Interpretation  Date/Time:  Sunday May 02 2016 11:35:42 EDT Ventricular Rate:  68 PR Interval:    QRS Duration: 87 QT Interval:  397 QTC Calculation: 423 R Axis:   36 Text Interpretation:  Sinus rhythm Confirmed by Jeneen Rinks  MD, [place] ([id number]) on 05/02/2016 11:41:06 AM       Radiology No results found.  Procedures Procedures (including critical care time)  Medications Ordered in  ED Medications  lidocaine-EPINEPHrine (XYLOCAINE W/EPI) 2 %-1:100000 (with pres) injection (not administered)  amoxicillin (AMOXIL) capsule 500 mg (not administered)  lidocaine-EPINEPHrine (XYLOCAINE W/EPI) 1 %-1:100000 (with pres) injection 20 mL (20 mLs Infiltration Given by Other 05/02/16 1202)     Initial Impression / Assessment and Plan / ED Course  I have reviewed the triage vital signs and the nursing notes.  Pertinent labs & imaging results that were available during my care of the patient were reviewed by me and considered in my medical decision making (see chart for details).  Clinical Course    Normal EKG. Exam and history are not consistent with acute acute bacterial sinusitis.  INCISION AND DRAINAGE Performed by: Lolita Patella Consent: Verbal consent obtained. Risks and benefits: risks, benefits and alternatives were discussed Type: abscess  Body area: Oral mucosa, periapical/buccal  Anesthesia: local infiltration  Incision was made with a scalpel.  Local anesthetic:  lidocaine 1% c epinephrine  Anesthetic total: 4 ml  Complexity: complex Blunt dissection to break up loculations  Drainage: purulent  Drainage amount: 1-58m  Packing material: 1/4 in iodoform gauze  Patient tolerance: Patient tolerated the procedure well with no immediate complications.     Final Clinical Impressions(s) / ED Diagnoses   Final diagnoses:  Dental abscess  Sinus congestion    Discharge home with amoxicillin. Oral rinses. New Prescriptions New Prescriptions   AMOXICILLIN (AMOXIL) 500 MG CAPSULE    Take 1 capsule (500 mg total) by mouth 3 (three) times daily.     MTanna Furry MD 05/02/16 1215

## 2016-05-02 NOTE — ED Triage Notes (Addendum)
Pt states he felt like he had a fever this morning, he did not take his temperature. Pt felt chest tightness on Friday for ~5 seconds, but states he has not felt tightness since. Pt also reports he saw blood when he blew his nose on Friday and has had congestion for the past week. Pt is concerned he has sinus infection. Pt denies shortness of breath, cough, sore throat.

## 2016-05-02 NOTE — ED Notes (Signed)
Discharge instructions, follow up care, and rx x1 reviewed with patient. Patient verbalized understanding.

## 2016-07-27 ENCOUNTER — Other Ambulatory Visit: Payer: Self-pay | Admitting: Family Medicine

## 2016-07-27 DIAGNOSIS — I10 Essential (primary) hypertension: Secondary | ICD-10-CM

## 2016-07-31 NOTE — Telephone Encounter (Signed)
12/2015 last ov 01/2016 last labs

## 2016-10-02 ENCOUNTER — Emergency Department (HOSPITAL_COMMUNITY)
Admission: EM | Admit: 2016-10-02 | Discharge: 2016-10-02 | Discharge status: Against Medical Advice | Payer: Managed Care, Other (non HMO)

## 2016-10-02 NOTE — ED Triage Notes (Signed)
Pt called twice to triage, no answer

## 2016-10-12 ENCOUNTER — Ambulatory Visit: Payer: Managed Care, Other (non HMO)

## 2016-10-21 ENCOUNTER — Other Ambulatory Visit: Payer: Self-pay | Admitting: Family Medicine

## 2016-10-21 DIAGNOSIS — I10 Essential (primary) hypertension: Secondary | ICD-10-CM

## 2016-10-24 ENCOUNTER — Other Ambulatory Visit: Payer: Self-pay | Admitting: Family Medicine

## 2016-10-24 DIAGNOSIS — I10 Essential (primary) hypertension: Secondary | ICD-10-CM

## 2016-11-20 ENCOUNTER — Encounter (HOSPITAL_COMMUNITY): Payer: Self-pay | Admitting: Emergency Medicine

## 2016-11-20 ENCOUNTER — Emergency Department (HOSPITAL_COMMUNITY)
Admission: EM | Admit: 2016-11-20 | Discharge: 2016-11-20 | Disposition: A | Discharge status: Home / Self Care | Payer: 59 | Attending: Emergency Medicine | Admitting: Emergency Medicine

## 2016-11-20 ENCOUNTER — Other Ambulatory Visit: Payer: Self-pay | Admitting: Family Medicine

## 2016-11-20 DIAGNOSIS — W228XXA Striking against or struck by other objects, initial encounter: Secondary | ICD-10-CM | POA: Diagnosis not present

## 2016-11-20 DIAGNOSIS — S91209A Unspecified open wound of unspecified toe(s) with damage to nail, initial encounter: Secondary | ICD-10-CM

## 2016-11-20 DIAGNOSIS — Z79899 Other long term (current) drug therapy: Secondary | ICD-10-CM | POA: Diagnosis not present

## 2016-11-20 DIAGNOSIS — I10 Essential (primary) hypertension: Secondary | ICD-10-CM

## 2016-11-20 DIAGNOSIS — I252 Old myocardial infarction: Secondary | ICD-10-CM | POA: Insufficient documentation

## 2016-11-20 DIAGNOSIS — Y939 Activity, unspecified: Secondary | ICD-10-CM | POA: Insufficient documentation

## 2016-11-20 DIAGNOSIS — S91205A Unspecified open wound of left lesser toe(s) with damage to nail, initial encounter: Secondary | ICD-10-CM | POA: Diagnosis present

## 2016-11-20 DIAGNOSIS — Y999 Unspecified external cause status: Secondary | ICD-10-CM | POA: Diagnosis not present

## 2016-11-20 DIAGNOSIS — Y929 Unspecified place or not applicable: Secondary | ICD-10-CM | POA: Diagnosis not present

## 2016-11-20 MED ORDER — IBUPROFEN 200 MG PO TABS
ORAL_TABLET | ORAL | Status: AC
  Filled 2016-11-20: qty 1

## 2016-11-20 MED ORDER — IBUPROFEN 400 MG PO TABS
ORAL_TABLET | ORAL | Status: AC
  Filled 2016-11-20: qty 1

## 2016-11-20 MED ORDER — IBUPROFEN 400 MG PO TABS
600.0000 mg | ORAL_TABLET | Freq: Once | ORAL | Status: AC
  Administered 2016-11-20: 600 mg via ORAL

## 2016-11-20 NOTE — ED Notes (Signed)
Declined W/C at D/C and was escorted to lobby by RN. 

## 2016-11-20 NOTE — ED Triage Notes (Signed)
Stubbed left pinky toe on end of bed -- now nail is "hanging off" per patient and is causing him pain- bruise to end of toe noted.

## 2016-11-20 NOTE — ED Provider Notes (Signed)
[facility] DEPT Provider Note    By signing my name below, I, Bea Graff, attest that this documentation has been prepared under the direction and in the presence of Alyse Low, [place]. Electronically Signed: Bea Graff, ED Scribe. 11/20/16. 4:00 PM.    History   Chief Complaint Chief Complaint  Patient presents with  . Toe Injury    The history is provided by the patient and medical records. No language interpreter was used.    Dylan Gonzalez is a 51 y.o. male with PMHx of HTN who presents to the Emergency Department complaining of left fifth toe pain that began two days ago secondary to subbing it on his bed frame. He reports associated moderate pain and bruising. He states the nail bent backwards and is hanging. He has not taken anything for pain relief. Touching the nail area increases the pain. He denies alleviating factors. He denies fever, chills, nausea, vomiting, active bleeding, numbness, tingling or weakness of the left fifth toe or left foot.   Past Medical History:  Diagnosis Date  . Hypertension   . MI (myocardial infarction)     Patient Active Problem List   Diagnosis Date Noted  . Hypertension 12/26/2011  . 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 eye removed    . HERNIA REPAIR    . INTRAOCULAR PROSTHESES INSERTION    . TOOTH EXTRACTION Right 04/21/2013   Procedure: EXTRACTION MOLARS;  Surgeon: [name], DDS;  Location: [facility];  Service: Oral Surgery;  Laterality: Right;       Home Medications    Prior to Admission medications   Medication Sig Start Date End Date Taking? Authorizing Provider  acetaminophen (TYLENOL) 500 MG tablet Take 500 mg by mouth every 6 (six) hours as needed for mild pain, moderate pain or headache. Reported on 12/07/2015    Historical Provider, MD  amoxicillin (AMOXIL) 500 MG capsule Take 1 capsule (500 mg total) by mouth 3 (three) times daily. 05/02/16    Tanna Furry, MD  cloNIDine (CATAPRES - DOSED IN MG/24 HR) 0.2 mg/24hr patch APPLY 1 PATCH ONTO THE SKIN ONCE A WEEK. 10/12/15   Robyn Haber, MD  doxycycline (VIBRAMYCIN) 100 MG capsule Take 1 capsule (100 mg total) by mouth 2 (two) times daily. One po bid x 7 days 01/23/16   Mercedes Street, PA-C  fluticasone (FLONASE) 50 MCG/ACT nasal spray PLACE 2 SPRAYS INTO BOTH NOSTRILS DAILY. 07/22/14   Thao P Le, DO  hydrALAZINE (APRESOLINE) 25 MG tablet Take 1 tablet (25 mg total) by mouth 2 (two) times daily. 10/12/15   Robyn Haber, MD  HYDROcodone-acetaminophen (NORCO) 5-325 MG tablet Take 1 tablet by mouth every 6 (six) hours as needed for severe pain. 01/23/16   Mercedes Street, PA-C  hydrOXYzine (ATARAX/VISTARIL) 25 MG tablet Take 1 tablet (25 mg total) by mouth every 6 (six) hours. 01/31/16   Nona Dell, PA-C  hydrOXYzine (VISTARIL) 100 MG capsule TAKE ONE CAPSULE BY MOUTH AT BEDTIME AS NEEDED FOR ALLERGY 10/12/15   Robyn Haber, MD  losartan-hydrochlorothiazide [facility]) 100-25 MG tablet TAKE 1 TABLET BY MOUTH EVERY DAY 10/25/16   Wendie Agreste, MD  metoprolol succinate (TOPROL-XL) 25 MG 24 hr tablet TAKE 1 TABLET BY MOUTH EVERY DAY. 07/31/16   Chelle Jeffery, PA-C  metoprolol succinate (TOPROL-XL) 25 MG 24 hr tablet TAKE 1 TABLET BY MOUTH EVERY DAY. 10/21/16   Wendie Agreste, MD  naproxen (NAPROSYN) 500 MG tablet  Take 1 tablet (500 mg total) by mouth 2 (two) times daily with a meal. 03/17/16   Larene Pickett, PA-C  oxyCODONE-acetaminophen (PERCOCET/ROXICET) 5-325 MG per tablet 1 to 2 tabs PO q6hrs  PRN for pain 03/11/15   Elmyra Ricks Pisciotta, PA-C  penicillin v potassium (VEETID) 500 MG tablet Take 1 tablet (500 mg total) by mouth 4 (four) times daily. 03/17/16   Larene Pickett, PA-C  predniSONE (DELTASONE) 20 MG tablet Take 2 tablets (40 mg total) by mouth daily. 01/31/16   Nona Dell, PA-C  spironolactone (ALDACTONE) 25 MG tablet Take 1 tablet (25 mg total) by mouth daily.  10/12/15   Robyn Haber, MD    Family History Family History  Problem Relation Age of Onset  . Hypertension Father   . Depression Brother     Social History Social History  Substance Use Topics  . Smoking status: Never Smoker  . Smokeless tobacco: Never Used  . Alcohol use No     Allergies   Pollen extract   Review of Systems Review of Systems  Musculoskeletal: Positive for arthralgias.  Skin: Positive for color change and wound.  All other systems reviewed and are negative.    Physical Exam Updated Vital Signs BP (!) 143/113 (BP Location: Left Arm)   Pulse 83   Temp 98.3 F (36.8 C) (Oral)   Resp 20   Ht '5\' 11"'$  (1.803 m)   Wt 230 lb (104.3 kg)   SpO2 98%   BMI 32.08 kg/m   Physical Exam  Constitutional: He is oriented to person, place, and time. He appears well-developed and well-nourished.  HENT:  Head: Normocephalic and atraumatic.  Neck: Normal range of motion.  Cardiovascular: Normal rate.   Pulmonary/Chest: Effort normal.  Musculoskeletal: Normal range of motion. He exhibits tenderness. He exhibits no deformity.  Left fifth toe with almost completely avulsed toe nail.  Neurological: He is alert and oriented to person, place, and time.  Skin: Skin is warm and dry.  Psychiatric: He has a normal mood and affect. His behavior is normal.  Nursing note and vitals reviewed.    ED Treatments / Results  DIAGNOSTIC STUDIES: Oxygen Saturation is 98% on RA, normal by my interpretation.   COORDINATION OF CARE: 3:50 PM- Will remove toe nail. Pt verbalizes understanding and agrees to plan.  Medications - No data to display  Labs (all labs ordered are listed, but only abnormal results are displayed) Labs Reviewed - No data to display  EKG  EKG Interpretation None       Radiology No results found.  Procedures .Nail Removal Date/Time: 11/20/2016 3:52 PM Performed by: Fransico Meadow Authorized by: Fransico Meadow   Consent:    Consent  obtained:  Verbal   Consent given by:  Patient   Risks discussed:  Bleeding and pain   Alternatives discussed:  No treatment Location:    Foot:  R little toe Anesthesia (see MAR for exact dosages):    Anesthesia method: Freeze spray. Nail Removal:    Nail removed:  Complete   Nail bed repaired: no     Removed nail replaced and anchored: no   Ingrown nail:    Wedge excision of skin: no     Nail matrix removed or ablated:  None Nails trimmed:    Number of nails trimmed:  1 Post-procedure details:    Dressing:  4x4 sterile gauze   Patient tolerance of procedure:  Tolerated well, no immediate complications Comments:  Toe nail removed with gentle traction   (including critical care time)  Medications Ordered in ED Medications - No data to display   Initial Impression / Assessment and Plan / ED Course  I have reviewed the triage vital signs and the nursing notes.  Pertinent labs & imaging results that were available during my care of the patient were reviewed by me and considered in my medical decision making (see chart for details).     Patient presentation consistent an injury to left fifth toe. He reports stubbing the toe and now the nail is hanging off. Nail removed without complication. Afebrile. No tachycardia, hypotension or other symptoms suggestive of severe infection. Advised to follow up for follow up for worsening systemic symptoms. Will discharge with . Return precautions discussed. Pt appears safe for discharge.     I personally performed the services in this documentation, which was scribed in my presence.  The recorded information has been reviewed and considered.   Ronnald Collum.  Final Clinical Impressions(s) / ED Diagnoses   Final diagnoses:  Avulsion of toenail, initial encounter    New Prescriptions New Prescriptions   No medications on file  bandage An After Visit Summary was printed and given to the patient.    Hollace Kinnier [place], PA-C 11/20/16  [place], MD 11/22/16 [phone]

## 2016-11-20 NOTE — Discharge Instructions (Signed)
Soak area 20 minutes 4 times a day °

## 2016-11-27 ENCOUNTER — Other Ambulatory Visit: Payer: Self-pay | Admitting: Family Medicine

## 2016-11-27 DIAGNOSIS — I10 Essential (primary) hypertension: Secondary | ICD-10-CM

## 2016-11-29 ENCOUNTER — Ambulatory Visit: Payer: Managed Care, Other (non HMO)

## 2016-11-29 NOTE — Telephone Encounter (Signed)
Left message to schedule visit before this supply is exhausted.  Meds ordered this encounter  Medications  . losartan-hydrochlorothiazide (HYZAAR) 100-25 MG tablet    Sig: Take 1 tablet by mouth daily.    Dispense:  30 tablet    Refill:  0    Please notify patient that s/he needs an office visit +/- labsfor additional refills.

## 2016-12-09 ENCOUNTER — Other Ambulatory Visit: Payer: Self-pay | Admitting: Family Medicine

## 2016-12-09 DIAGNOSIS — J0100 Acute maxillary sinusitis, unspecified: Secondary | ICD-10-CM

## 2016-12-09 DIAGNOSIS — I10 Essential (primary) hypertension: Secondary | ICD-10-CM

## 2016-12-17 ENCOUNTER — Other Ambulatory Visit: Payer: Self-pay | Admitting: Physician Assistant

## 2016-12-17 DIAGNOSIS — I10 Essential (primary) hypertension: Secondary | ICD-10-CM

## 2016-12-19 ENCOUNTER — Other Ambulatory Visit: Payer: Self-pay | Admitting: Family Medicine

## 2016-12-19 DIAGNOSIS — I10 Essential (primary) hypertension: Secondary | ICD-10-CM

## 2016-12-20 ENCOUNTER — Telehealth: Payer: Self-pay | Admitting: Family Medicine

## 2016-12-20 NOTE — Telephone Encounter (Signed)
Call --- I denied refill of Clonidine as we have not seen patient in over one year.  Dr. [name] has retired.  Please schedule OV this week with a new provider; perhaps one of the new providers has an opening at [facility] this week OR offer an appointment with a provider he requests.

## 2016-12-20 NOTE — Telephone Encounter (Signed)
LMOM TO CALL AND SCHEDULE APPOINTMENT WITH ONE OF OUR NEW PROVIDERS THIS WEEK FOR A REFILL ON CLONIDINE DR LAUENSTEIN HAS RETIRED AN NEED A NEW PROVIDER PLEASE TRY AND PUT PATIENT AT THE [facility] BUILDING WITH ANY PROVIDER

## 2016-12-26 ENCOUNTER — Other Ambulatory Visit: Payer: Self-pay | Admitting: Physician Assistant

## 2016-12-26 DIAGNOSIS — I10 Essential (primary) hypertension: Secondary | ICD-10-CM

## 2016-12-27 NOTE — Telephone Encounter (Signed)
Please call patient to schedule follow-up HTN. Needs to establish with PCP for additional refills.  Meds ordered this encounter  Medications  . losartan-hydrochlorothiazide (HYZAAR) 100-25 MG tablet    Sig: TAKE 1 TABLET BY MOUTH EVERY DAY (MUST HAVE OFFICE VISIT FOR FURTHER REFILLS)    Dispense:  30 tablet    Refill:  0

## 2017-02-22 ENCOUNTER — Emergency Department (HOSPITAL_COMMUNITY): Payer: 59

## 2017-02-22 ENCOUNTER — Emergency Department (HOSPITAL_COMMUNITY)
Admission: EM | Admit: 2017-02-22 | Discharge: 2017-02-22 | Disposition: A | Discharge status: Home / Self Care | Payer: 59 | Attending: Emergency Medicine | Admitting: Emergency Medicine

## 2017-02-22 DIAGNOSIS — Z79899 Other long term (current) drug therapy: Secondary | ICD-10-CM | POA: Diagnosis not present

## 2017-02-22 DIAGNOSIS — I252 Old myocardial infarction: Secondary | ICD-10-CM | POA: Diagnosis not present

## 2017-02-22 DIAGNOSIS — I10 Essential (primary) hypertension: Secondary | ICD-10-CM | POA: Diagnosis not present

## 2017-02-22 DIAGNOSIS — R200 Anesthesia of skin: Secondary | ICD-10-CM | POA: Diagnosis present

## 2017-02-22 DIAGNOSIS — R0789 Other chest pain: Secondary | ICD-10-CM | POA: Insufficient documentation

## 2017-02-22 DIAGNOSIS — M5412 Radiculopathy, cervical region: Secondary | ICD-10-CM | POA: Diagnosis not present

## 2017-02-22 DIAGNOSIS — R079 Chest pain, unspecified: Secondary | ICD-10-CM

## 2017-02-22 LAB — BASIC METABOLIC PANEL
ANION GAP: 7 (ref 5–15)
BUN: 17 mg/dL (ref 6–20)
CHLORIDE: 104 mmol/L (ref 101–111)
CO2: 30 mmol/L (ref 22–32)
Calcium: 9.2 mg/dL (ref 8.9–10.3)
Creatinine, Ser: 1.2 mg/dL (ref 0.61–1.24)
GFR calc non Af Amer: >60 mL/min (ref >60)
Glucose, Bld: 98 mg/dL (ref 65–99)
Potassium: 3.7 mmol/L (ref 3.5–5.1)
Sodium: 141 mmol/L (ref 135–145)

## 2017-02-22 LAB — CBC WITH DIFFERENTIAL/PLATELET
Basophils Absolute: 0 10*3/uL (ref 0.0–0.1)
Basophils Relative: 0 %
Eosinophils Absolute: 0.3 10*3/uL (ref 0.0–0.7)
Eosinophils Relative: 4 %
HCT: 40.3 % (ref 39.0–52.0)
HEMOGLOBIN: 13 g/dL (ref 13.0–17.0)
LYMPHS ABS: 2.2 10*3/uL (ref 0.7–4.0)
Lymphocytes Relative: 29 %
MCH: 30.9 pg (ref 26.0–34.0)
MCHC: 32.3 g/dL (ref 30.0–36.0)
MCV: 95.7 fL (ref 78.0–100.0)
MONO ABS: 0.6 10*3/uL (ref 0.1–1.0)
MONOS PCT: 8 %
NEUTROS ABS: 4.4 10*3/uL (ref 1.7–7.7)
NEUTROS PCT: 59 %
Platelets: 224 10*3/uL (ref 150–400)
RBC: 4.21 MIL/uL — ABNORMAL LOW (ref 4.22–5.81)
RDW: 12.8 % (ref 11.5–15.5)
WBC: 7.4 10*3/uL (ref 4.0–10.5)

## 2017-02-22 LAB — I-STAT TROPONIN, ED: Troponin i, poc: 0 ng/mL (ref 0.00–0.08)

## 2017-02-22 MED ORDER — METHYLPREDNISOLONE 4 MG PO TBPK: ORAL_TABLET | ORAL | 0 refills | Status: DC

## 2017-02-22 NOTE — Discharge Instructions (Signed)
All of your blood work and imaging free of chest pain is normal. This is likely not cardiac in nature. Patient to follow-up with her primary care doctor concerning the chest pain. May start taking an over-the-counter acid reducer such as Prilosec or Nexium.  In terms of your numbness in her right arm this is likely due to a pinched nerve and your neck. He did call your primary care doctor today to have them schedule an outpatient MRI of your cervical spine. Take the steroid pack as prescribed. May take Tylenol and Motrin as needed. Return to the ED she develop any weakness, difficulty speaking, difficulties walking, facial asymmetry or as needed.

## 2017-02-22 NOTE — ED Notes (Signed)
Pt returned to room from xray.

## 2017-02-22 NOTE — ED Triage Notes (Signed)
Pt states for the last 3 weeks pt has had numbness to his right arm. He states this does not hinder any of his activities and it does not cause him any pain. No neurological deficits.

## 2017-02-22 NOTE — ED Notes (Signed)
Pt reports he had a single episode of chest tightness yesterday while at work. Pt states that he has not had any chest pain since and is not currently having any chest pain at this time.

## 2017-02-22 NOTE — ED Provider Notes (Signed)
[facility] DEPT Provider Note   CSN: [id number] Arrival date & time: 02/22/17  0350     History   Chief Complaint Chief Complaint  Patient presents with  . Numbness    HPI Dylan Gonzalez is a 51 y.o. male.  HPI 51 year old African-American male past medical history significant for hypertension and MI presents to the emergency Department today with complaints of intermittent right arm numbness for the past 3 weeks. Patient also endorses single episode of chest tightness yesterday. Patient states for the past 3 weeks he's had this intermittent right arm numbness that "feels like I hit my funny bone without hitting part". States that the self resolved and returned spontaneously. It is not worse with movement of his neck or arm. There is no pattern to the numbness. Reports numbness from his fingertips to his right shoulder. Denies any numbness at this time. Denies any trauma. Denies any other symptoms including facial asymmetry, difficulty speaking, difficulties walking. Patient also endorses a single episode of chest tightness yesterday that was substernal and does not radiate. It was not associated with diaphoresis, nausea, emesis, shortness of breath. This self resolved after 10 seconds and has not returned. Has not tried nothing for his symptoms. Denies any symptoms at this time. Pt denies any fever, chill, ha, vision changes, lightheadedness, dizziness, congestion, neck pain,  sob, cough, abd pain, n/v/d, urinary symptoms, change in bowel habits, melena, hematochezia, lower extremity paresthesias. Denies any history of DVT/PE, prolonged immobilizations, recent hospitalizations/surgeries, tobacco use, lower extremity edema or calf tenderness.  Past Medical History:  Diagnosis Date  . Hypertension   . MI (myocardial infarction)     Patient Active Problem List   Diagnosis Date Noted  . Hypertension 12/26/2011  . 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 eye removed    . HERNIA REPAIR    . INTRAOCULAR PROSTHESES INSERTION    . TOOTH EXTRACTION Right 04/21/2013   Procedure: EXTRACTION MOLARS;  Surgeon: [name], DDS;  Location: [place] [facility];  Service: Oral Surgery;  Laterality: Right;       Home Medications    Prior to Admission medications   Medication Sig Start Date End Date Taking? Authorizing Provider  acetaminophen (TYLENOL) 500 MG tablet Take 500 mg by mouth every 6 (six) hours as needed for mild pain, moderate pain or headache. Reported on 12/07/2015    [provider]  cloNIDine (CATAPRES - DOSED IN MG/24 HR) 0.2 mg/24hr patch APPLY 1 PATCH ONTO THE SKIN ONCE A WEEK. 10/12/15   Robyn Haber, MD  fluticasone (FLONASE) 50 MCG/ACT nasal spray PLACE 2 SPRAYS INTO BOTH NOSTRILS DAILY. 07/22/14   Le, Thao P, DO  hydrALAZINE (APRESOLINE) 25 MG tablet Take 1 tablet (25 mg total) by mouth 2 (two) times daily. 10/12/15   Robyn Haber, MD  hydrOXYzine (VISTARIL) 100 MG capsule TAKE ONE CAPSULE BY MOUTH AT BEDTIME AS NEEDED FOR ALLERGY 10/12/15   Robyn Haber, MD  losartan-hydrochlorothiazide (HYZAAR) 100-25 MG tablet TAKE 1 TABLET BY MOUTH EVERY DAY (MUST HAVE OFFICE VISIT FOR FURTHER REFILLS) 12/27/16   Harrison Mons, PA-C  metoprolol succinate (TOPROL-XL) 25 MG 24 hr tablet TAKE 1 TABLET BY MOUTH EVERY DAY. 10/21/16   Wendie Agreste, MD  metoprolol succinate (TOPROL-XL) 25 MG 24 hr tablet TAKE 1 TABLET BY MOUTH EVERY DAY. 12/19/16   Tereasa Coop, PA-C  spironolactone (ALDACTONE) 25 MG tablet Take 1 tablet (25 mg total) by mouth daily.  10/12/15   Robyn Haber, MD    Family History Family History  Problem Relation Age of Onset  . Hypertension Father   . Depression Brother     Social History Social History  Substance Use Topics  . Smoking status: Never Smoker  . Smokeless tobacco: Never Used  . Alcohol use No     Allergies   Pollen extract   Review of Systems Review  of Systems  Constitutional: Negative for chills and fever.  HENT: Negative for congestion and sore throat.   Eyes: Negative for visual disturbance.  Respiratory: Negative for cough and shortness of breath.   Cardiovascular: Positive for chest pain. Negative for palpitations and leg swelling.  Gastrointestinal: Negative for abdominal pain, diarrhea, nausea and vomiting.  Genitourinary: Negative for dysuria, flank pain, frequency, hematuria, scrotal swelling, testicular pain and urgency.  Musculoskeletal: Negative for arthralgias and myalgias.  Skin: Negative for rash.  Neurological: Positive for numbness. Negative for dizziness, syncope, weakness, light-headedness and headaches.  Psychiatric/Behavioral: Negative for sleep disturbance. The patient is not nervous/anxious.      Physical Exam Updated Vital Signs BP (!) 146/95 (BP Location: Right Arm)   Pulse 72   Temp 97.8 F (36.6 C) (Oral)   Resp 18   Ht 5\' 11"  (1.803 m)   Wt 102.1 kg (225 lb)   SpO2 96%   BMI 31.38 kg/m   Physical Exam  Constitutional: He is oriented to person, place, and time. He appears well-developed and well-nourished.  Non-toxic appearance. No distress.  HENT:  Head: Normocephalic and atraumatic.  Nose: Nose normal.  Mouth/Throat: Oropharynx is clear and moist.  Eyes: Conjunctivae are normal. Pupils are equal, round, and reactive to light. Right eye exhibits no discharge. Left eye exhibits no discharge.  Neck: Normal range of motion. Neck supple. No JVD present. No tracheal deviation present.  Cardiovascular: Normal rate, regular rhythm, normal heart sounds and intact distal pulses.   Pulmonary/Chest: Effort normal and breath sounds normal. No respiratory distress. He exhibits no tenderness.  No hypoxia or tachypnea.  Abdominal: Soft. Bowel sounds are normal. He exhibits no distension. There is no tenderness. There is no rebound and no guarding.  Musculoskeletal: Normal range of motion.  No lower extremity  edema or calf tenderness.  Lymphadenopathy:    He has no cervical adenopathy.  Neurological: He is alert and oriented to person, place, and time.  The patient is alert, attentive, and oriented x 3. Speech is clear. Cranial nerve II-VII grossly intact. Negative pronator drift. Sensation intact. Strength 5/5 in all extremities. Reflexes 2+ and symmetric at biceps, triceps, knees, and ankles. Rapid alternating movement and fine finger movements intact. Romberg is absent. Posture and gait normal.  Sensation intact to sharp/dull and upper extremity bilaterally. Proprioception intact. Point discrimination normal.  Skin: Skin is warm and dry. Capillary refill takes less than 2 seconds. He is not diaphoretic.  Psychiatric: His behavior is normal. Judgment and thought content normal.  Nursing note and vitals reviewed.    ED Treatments / Results  Labs (all labs ordered are listed, but only abnormal results are displayed) Labs Reviewed  CBC WITH DIFFERENTIAL/PLATELET - Abnormal; Notable for the following:       Result Value   RBC 4.21 (*)    All other components within normal limits  BASIC METABOLIC PANEL  Randolm Idol, ED    EKG  EKG Interpretation  Date/Time:  Tuesday February 22 2017 07:40:39 EDT Ventricular Rate:  71 PR Interval:    QRS Duration:  89 QT Interval:  388 QTC Calculation: 422 R Axis:   62 Text Interpretation:  Sinus rhythm Consider left ventricular hypertrophy no acute ST/T changes Confirmed by Sherwood Gambler [phone]) on 02/22/2017 8:41:30 AM       Radiology Dg Chest 2 View  Result Date: 02/22/2017 CLINICAL DATA:  One day of chest pain. Head neck and right arm pain pre seeding this. History of hypertension, previous MI, nonsmoker. EXAM: CHEST  2 VIEW COMPARISON:  Portable chest x-ray of September 15, 2013 FINDINGS: The lungs are adequately inflated and clear. The heart and pulmonary vascularity are normal. The mediastinum is normal in width. There is no pleural effusion.  The trachea is midline. The bony thorax exhibits no acute abnormality. IMPRESSION: There is no CHF, pneumonia, nor other acute cardiopulmonary abnormality. Electronically Signed   By: David  Martinique M.D.   On: 02/22/2017 07:36   Dg Cervical Spine Complete  Result Date: 02/22/2017 CLINICAL DATA:  Three weeks of generalized neck pain with right arm pain. No known injury. EXAM: CERVICAL SPINE - COMPLETE 4+ VIEW COMPARISON:  Limited views of the cervical spine from a facial bone CT scan dated March 11, 2015. FINDINGS: The cervical vertebral bodies are preserved in height. The disc space heights are well maintained. The oblique views reveal mild bony encroachment upon the neural foramina on the left and moderate encroachment upon mid cervical neural foramina on the right. The spinous processes are intact. No perched facet is observed. The odontoid is intact where visualized. The prevertebral soft tissue spaces are normal. IMPRESSION: There is no acute bony abnormality of the neck. There is bony encroachment upon the neural foramina bilaterally greatest on the right. Given the patient's radicular symptoms, cervical spine MRI would be a useful next imaging step. Electronically Signed   By: David  Martinique M.D.   On: 02/22/2017 07:38    Procedures Procedures (including critical care time)  Medications Ordered in ED Medications - No data to display   Initial Impression / Assessment and Plan / ED Course  I have reviewed the triage vital signs and the nursing notes.  Pertinent labs & imaging results that were available during my care of the patient were reviewed by me and considered in my medical decision making (see chart for details).     Pt presents to the Ed today with complaints of cp and right arm numbness.  Patient is neurovascularly intact. He has no focal neuro deficits. Denies any numbness at this time. Patient's symptoms seem consistent with a cervical radiculopathy. X-ray of the neck does note  bony encroachment upon the neural foramina bilaterally greatest on the right that is likely causing patient's symptoms. Doubt acute CVA given normal neuro exam. MRI would be next best choice for imaging and patient will follow up with his primary Doctor in the outpatient setting for MRI. We'll give Medrol Dosepak in the interim.   Patient is to be discharged with recommendation to follow up with PCP in regards to today's hospital visit. Chest pain is not likely of cardiac or pulmonary etiology d/t presentation, perc positive due to age but wells 0 Clinical presentation is not concerning for PE, VSS, no tracheal deviation, no JVD or new murmur, RRR, breath sounds equal bilaterally, EKG without any change from prior tracing and shows no signs of ischemia, negative troponin, and negative CXR. Patient symptoms occurred yesterday for 10 seconds. Has not had any further chest pain. Do not felt that delta troponins indicated as it has been greater than  6 hours since patient's last chest pain. Presentation is not consistent with aortic dissection. Pt has been advised to return to the ED is CP becomes exertional, associated with diaphoresis or nausea, radiates to left jaw/arm, worsens or becomes concerning in any way. Pt appears reliable for follow up and is agreeable to discharge.   Case has been discussed with Dr. Regenia Skeeter who agrees with the above plan to discharge.     Final Clinical Impressions(s) / ED Diagnoses   Final diagnoses:  Chest pain, unspecified type  Cervical radiculopathy    New Prescriptions New Prescriptions   METHYLPREDNISOLONE (MEDROL DOSEPAK) 4 MG TBPK TABLET    Take as directed     Doristine Devoid, PA-C 02/22/17 [id number]    Sherwood Gambler, MD 02/22/17 [phone]

## 2017-02-28 ENCOUNTER — Other Ambulatory Visit: Payer: Self-pay | Admitting: Family Medicine

## 2017-02-28 DIAGNOSIS — M5412 Radiculopathy, cervical region: Secondary | ICD-10-CM

## 2017-03-15 ENCOUNTER — Ambulatory Visit
Admission: RE | Admit: 2017-03-15 | Discharge: 2017-03-15 | Disposition: A | Discharge status: Home / Self Care | Payer: 59 | Source: Ambulatory Visit | Attending: Family Medicine | Admitting: Family Medicine

## 2017-03-15 DIAGNOSIS — M5412 Radiculopathy, cervical region: Secondary | ICD-10-CM

## 2017-05-31 ENCOUNTER — Encounter (HOSPITAL_COMMUNITY): Payer: Self-pay | Admitting: *Deleted

## 2017-05-31 ENCOUNTER — Emergency Department (HOSPITAL_COMMUNITY)
Admission: EM | Admit: 2017-05-31 | Discharge: 2017-05-31 | Disposition: A | Discharge status: Home / Self Care | Payer: 59 | Attending: Emergency Medicine | Admitting: Emergency Medicine

## 2017-05-31 DIAGNOSIS — I10 Essential (primary) hypertension: Secondary | ICD-10-CM | POA: Diagnosis not present

## 2017-05-31 DIAGNOSIS — Y990 Civilian activity done for income or pay: Secondary | ICD-10-CM | POA: Diagnosis not present

## 2017-05-31 DIAGNOSIS — I252 Old myocardial infarction: Secondary | ICD-10-CM | POA: Diagnosis not present

## 2017-05-31 DIAGNOSIS — Y929 Unspecified place or not applicable: Secondary | ICD-10-CM | POA: Diagnosis not present

## 2017-05-31 DIAGNOSIS — S29012A Strain of muscle and tendon of back wall of thorax, initial encounter: Secondary | ICD-10-CM | POA: Diagnosis not present

## 2017-05-31 DIAGNOSIS — Y9389 Activity, other specified: Secondary | ICD-10-CM | POA: Insufficient documentation

## 2017-05-31 DIAGNOSIS — X500XXA Overexertion from strenuous movement or load, initial encounter: Secondary | ICD-10-CM | POA: Diagnosis not present

## 2017-05-31 DIAGNOSIS — S299XXA Unspecified injury of thorax, initial encounter: Secondary | ICD-10-CM | POA: Diagnosis present

## 2017-05-31 MED ORDER — MELOXICAM 15 MG PO TABS: 15.0000 mg | ORAL_TABLET | Freq: Every day | ORAL | 0 refills | Status: DC

## 2017-05-31 MED ORDER — BACLOFEN 10 MG PO TABS: 10.0000 mg | ORAL_TABLET | Freq: Three times a day (TID) | ORAL | 0 refills | Status: DC

## 2017-05-31 NOTE — ED Provider Notes (Signed)
[facility] DEPT Provider Note   CSN: [id number] Arrival date & time: 05/31/17  1325     History   Chief Complaint Chief Complaint  Patient presents with  . Back Pain    HPI Dylan Gonzalez is a 51 y.o. male who presents to the ED with a cc of Mid to lower back pain. Patient states that yesterday morning he just woke up with back pain. Patient states that his pain is worse when he sits for a long long period time twists moves or bends. 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 Denies weakness, loss of bowel/bladder function or saddle anesthesia. Denies neck stiffness, headache, rash.  Denies fever or recent procedures to back.   HPI  Past Medical History:  Diagnosis Date  . Hypertension   . MI (myocardial infarction) [facility])     Patient Active Problem List   Diagnosis Date Noted  . Hypertension 12/26/2011  . 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 eye removed    . HERNIA REPAIR    . INTRAOCULAR PROSTHESES INSERTION    . TOOTH EXTRACTION Right 04/21/2013   Procedure: EXTRACTION MOLARS;  Surgeon: [name], DDS;  Location: [facility];  Service: Oral Surgery;  Laterality: Right;       Home Medications    Prior to Admission medications   Medication Sig Start Date End Date Taking? Authorizing Provider  cetirizine (ZYRTEC) 10 MG tablet Take 10 mg by mouth daily as needed for allergies.    [provider]  cloNIDine (CATAPRES - DOSED IN MG/24 HR) 0.2 mg/24hr patch APPLY 1 PATCH ONTO THE SKIN ONCE A WEEK. Patient not taking: Reported on 02/22/2017 10/12/15   Robyn Haber, MD  cloNIDine (CATAPRES) 0.2 MG tablet Take 0.2 mg by mouth once a week. 01/07/17   [provider]  fluticasone (FLONASE) 50 MCG/ACT nasal spray PLACE 2 SPRAYS INTO BOTH NOSTRILS DAILY. Patient not taking: Reported on 02/22/2017 07/22/14   Le, Thao P, DO  hydrALAZINE (APRESOLINE) 25 MG tablet  Take 1 tablet (25 mg total) by mouth 2 (two) times daily. Patient not taking: Reported on 02/22/2017 10/12/15   Robyn Haber, MD  hydrOXYzine (VISTARIL) 100 MG capsule TAKE ONE CAPSULE BY MOUTH AT BEDTIME AS NEEDED FOR ALLERGY Patient not taking: Reported on 02/22/2017 10/12/15   Robyn Haber, MD  losartan-hydrochlorothiazide (HYZAAR) 100-25 MG tablet TAKE 1 TABLET BY MOUTH EVERY DAY (MUST HAVE OFFICE VISIT FOR FURTHER REFILLS) 12/27/16   Harrison Mons, PA-C  methylPREDNISolone (MEDROL DOSEPAK) 4 MG TBPK tablet Take as directed 02/22/17   Ocie Cornfield T, PA-C  metoprolol succinate (TOPROL-XL) 25 MG 24 hr tablet TAKE 1 TABLET BY MOUTH EVERY DAY. Patient not taking: Reported on 02/22/2017 10/21/16   Wendie Agreste, MD  metoprolol succinate (TOPROL-XL) 25 MG 24 hr tablet TAKE 1 TABLET BY MOUTH EVERY DAY. 12/19/16   Tereasa Coop, PA-C  Multiple Vitamin (MULTIVITAMIN) tablet Take 1 tablet by mouth daily.    [provider]  spironolactone (ALDACTONE) 25 MG tablet Take 1 tablet (25 mg total) by mouth daily. 10/12/15   Robyn Haber, MD    Family History Family History  Problem Relation Age of Onset  . Hypertension Father   . Depression Brother     Social History Social History  Substance Use Topics  . Smoking status: Never Smoker  . Smokeless tobacco: Never Used  . Alcohol use No  Allergies   Pollen extract   Review of Systems Review of Systems Ten systems reviewed and are negative for acute change, except as noted in the HPI.    Physical Exam Updated Vital Signs BP 131/86 (BP Location: Left Arm)   Pulse 66   Temp 98.5 F (36.9 C) (Oral)   Resp 17   Ht 5\' 11"  (1.803 m)   Wt 97.1 kg (214 lb)   SpO2 98%   BMI 29.85 kg/m   Physical Exam Physical Exam  Nursing note and vitals reviewed. Constitutional: He appears well-developed and well-nourished. No distress.  HENT:  Head: Normocephalic and atraumatic.  Eyes: Conjunctivae normal are normal. No  scleral icterus.  Neck: Normal range of motion. Neck supple.  Cardiovascular: Normal rate, regular rhythm and normal heart sounds.   Pulmonary/Chest: Effort normal and breath sounds normal. No respiratory distress.  Abdominal: Soft. There is no tenderness.  Musculoskeletal: He exhibits no edema. Patient appears to be in mild to moderate pain, antalgic gait noted. Left lower thoracic spasm and pain along the erectors. Lumbosacral spine area reveals no local tenderness or mass. Painful and reduced LS ROM noted. Straight leg raise is negative aDTR's, motor strength and sensation normal, including heel and toe gait.  Peripheral pulses are palpable. Neurological: He is alert.  Skin: Skin is warm and dry. He is not diaphoretic.  Psychiatric: His behavior is normal.     ED Treatments / Results  Labs (all labs ordered are listed, but only abnormal results are displayed) Labs Reviewed - No data to display  EKG  EKG Interpretation None       Radiology No results found.  Procedures Procedures (including critical care time)  Medications Ordered in ED Medications - No data to display   Initial Impression / Assessment and Plan / ED Course  I have reviewed the triage vital signs and the nursing notes.  Pertinent labs & imaging results that were available during my care of the patient were reviewed by me and considered in my medical decision making (see chart for details).     Patient with back pain.  No neurological deficits and normal neuro exam.  Patient can walk but states is painful.  No loss of bowel or bladder control.  No concern for cauda equina.  No fever, night sweats, weight loss, h/o cancer, IVDU.  RICE protocol and pain medicine indicated and discussed with patient.    Final Clinical Impressions(s) / ED Diagnoses   Final diagnoses:  Strain of mid-back, initial encounter    New Prescriptions New Prescriptions   No medications on file     Margarita Mail,  PA-C 06/01/17 [place], [place], DO 06/01/17 [phone]

## 2017-05-31 NOTE — ED Triage Notes (Signed)
Pt is here with bilateral lower back pain that started yesterday at 0700.  Denies injury  No belly pain

## 2017-05-31 NOTE — Discharge Instructions (Signed)
SEEK IMMEDIATE MEDICAL ATTENTION IF: New numbness, tingling, weakness, or problem with the use of your arms or legs.  Severe back pain not relieved with medications.  Change in bowel or bladder control.  Increasing pain in any areas of the body (such as chest or abdominal pain).  Shortness of breath, dizziness or fainting.  Nausea (feeling sick to your stomach), vomiting, fever, or sweats.  

## 2017-05-31 NOTE — ED Notes (Signed)
C/O LBP, onset last pm, no known injury.

## 2017-06-10 ENCOUNTER — Encounter (HOSPITAL_COMMUNITY): Payer: Self-pay

## 2017-06-10 ENCOUNTER — Emergency Department (HOSPITAL_COMMUNITY)
Admission: EM | Admit: 2017-06-10 | Discharge: 2017-06-10 | Disposition: A | Discharge status: Home / Self Care | Payer: 59 | Attending: Emergency Medicine | Admitting: Emergency Medicine

## 2017-06-10 DIAGNOSIS — W57XXXA Bitten or stung by nonvenomous insect and other nonvenomous arthropods, initial encounter: Secondary | ICD-10-CM | POA: Diagnosis not present

## 2017-06-10 DIAGNOSIS — Z79899 Other long term (current) drug therapy: Secondary | ICD-10-CM | POA: Insufficient documentation

## 2017-06-10 DIAGNOSIS — R21 Rash and other nonspecific skin eruption: Secondary | ICD-10-CM | POA: Diagnosis present

## 2017-06-10 DIAGNOSIS — I10 Essential (primary) hypertension: Secondary | ICD-10-CM | POA: Diagnosis not present

## 2017-06-10 DIAGNOSIS — T7840XA Allergy, unspecified, initial encounter: Secondary | ICD-10-CM | POA: Diagnosis not present

## 2017-06-10 DIAGNOSIS — I252 Old myocardial infarction: Secondary | ICD-10-CM | POA: Insufficient documentation

## 2017-06-10 MED ORDER — FAMOTIDINE 20 MG PO TABS
20.0000 mg | ORAL_TABLET | Freq: Once | ORAL | Status: AC
  Administered 2017-06-10: 20 mg via ORAL
  Filled 2017-06-10: qty 1

## 2017-06-10 MED ORDER — DIPHENHYDRAMINE HCL 25 MG PO CAPS
25.0000 mg | ORAL_CAPSULE | Freq: Once | ORAL | Status: AC
  Administered 2017-06-10: 25 mg via ORAL
  Filled 2017-06-10: qty 1

## 2017-06-10 MED ORDER — DIPHENHYDRAMINE HCL 25 MG PO CAPS: 25.0000 mg | ORAL_CAPSULE | Freq: Four times a day (QID) | ORAL | 0 refills | Status: DC | PRN

## 2017-06-10 MED ORDER — FAMOTIDINE 20 MG PO TABS: 20.0000 mg | ORAL_TABLET | Freq: Two times a day (BID) | ORAL | 0 refills | Status: DC

## 2017-06-10 NOTE — ED Triage Notes (Signed)
Per Pt, Pt is coming from home with complaints of rash noted to the left arm since yesterday. Unknown of insect bite or source. NO allergies.

## 2017-06-10 NOTE — ED Provider Notes (Signed)
[facility] DEPT Provider Note   CSN: [id number] Arrival date & time: 06/10/17  1254     History   Chief Complaint Chief Complaint  Patient presents with  . Rash    HPI  Dylan Gonzalez is a 51 y.o. male who presents complaining of a rash on his left upper arm that he noticed yesterday. Patient reports he was out mowing the lawn, when he came back in he noticed several red blotches on his left upper arm that were slightly itchy, they're not painful. He thinks he may have been bitten or stung while mowing the lawn. Patient reports redness has increased slightly since yesterday, continues to deny pain. Patient has not tried anything at home to treat the symptoms. Denies any facial swelling, shortness of breath or wheezing. Denies any previous allergic reactions to bug bites. No new household products      Past Medical History:  Diagnosis Date  . Hypertension   . MI (myocardial infarction) [facility])     Patient Active Problem List   Diagnosis Date Noted  . Hypertension 12/26/2011  . 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 eye removed    . HERNIA REPAIR    . INTRAOCULAR PROSTHESES INSERTION    . TOOTH EXTRACTION Right 04/21/2013   Procedure: EXTRACTION MOLARS;  Surgeon: [name], DDS;  Location: [place];  Service: Oral Surgery;  Laterality: Right;       Home Medications    Prior to Admission medications   Medication Sig Start Date End Date Taking? Authorizing Provider  baclofen (LIORESAL) 10 MG tablet Take 1 tablet (10 mg total) by mouth 3 (three) times daily. 05/31/17   Margarita Mail, PA-C  cetirizine (ZYRTEC) 10 MG tablet Take 10 mg by mouth daily as needed for allergies.    [provider]  cloNIDine (CATAPRES - DOSED IN MG/24 HR) 0.2 mg/24hr patch APPLY 1 PATCH ONTO THE SKIN ONCE A WEEK. Patient not taking: Reported on 02/22/2017 10/12/15   Robyn Haber, MD  cloNIDine (CATAPRES) 0.2 MG  tablet Take 0.2 mg by mouth once a week. 01/07/17   [provider]  diphenhydrAMINE (BENADRYL) 25 mg capsule Take 1 capsule (25 mg total) by mouth every 6 (six) hours as needed. 06/10/17   Jacqlyn Larsen, PA-C  famotidine (PEPCID) 20 MG tablet Take 1 tablet (20 mg total) by mouth 2 (two) times daily. 06/10/17   Jacqlyn Larsen, PA-C  fluticasone (FLONASE) 50 MCG/ACT nasal spray PLACE 2 SPRAYS INTO BOTH NOSTRILS DAILY. Patient not taking: Reported on 02/22/2017 07/22/14   Le, Thao P, DO  hydrALAZINE (APRESOLINE) 25 MG tablet Take 1 tablet (25 mg total) by mouth 2 (two) times daily. Patient not taking: Reported on 02/22/2017 10/12/15   Robyn Haber, MD  hydrOXYzine (VISTARIL) 100 MG capsule TAKE ONE CAPSULE BY MOUTH AT BEDTIME AS NEEDED FOR ALLERGY Patient not taking: Reported on 02/22/2017 10/12/15   Robyn Haber, MD  losartan-hydrochlorothiazide (HYZAAR) 100-25 MG tablet TAKE 1 TABLET BY MOUTH EVERY DAY (MUST HAVE OFFICE VISIT FOR FURTHER REFILLS) 12/27/16   Harrison Mons, PA-C  meloxicam (MOBIC) 15 MG tablet Take 1 tablet (15 mg total) by mouth daily. 05/31/17   Margarita Mail, PA-C  methylPREDNISolone (MEDROL DOSEPAK) 4 MG TBPK tablet Take as directed 02/22/17   Ocie Cornfield T, PA-C  metoprolol succinate (TOPROL-XL) 25 MG 24 hr tablet TAKE 1 TABLET BY MOUTH EVERY DAY. Patient not taking: Reported on 02/22/2017 10/21/16  Wendie Agreste, MD  metoprolol succinate (TOPROL-XL) 25 MG 24 hr tablet TAKE 1 TABLET BY MOUTH EVERY DAY. 12/19/16   Tereasa Coop, PA-C  Multiple Vitamin (MULTIVITAMIN) tablet Take 1 tablet by mouth daily.    [provider]  spironolactone (ALDACTONE) 25 MG tablet Take 1 tablet (25 mg total) by mouth daily. 10/12/15   Robyn Haber, MD    Family History Family History  Problem Relation Age of Onset  . Hypertension Father   . Depression Brother     Social History Social History  Substance Use Topics  . Smoking status: Never Smoker  . Smokeless  tobacco: Never Used  . Alcohol use No     Allergies   Pollen extract   Review of Systems Review of Systems  Constitutional: Negative for chills and fever.  HENT: Negative for facial swelling and trouble swallowing.   Respiratory: Negative for chest tightness, shortness of breath and wheezing.   Skin: Positive for rash.     Physical Exam Updated Vital Signs BP (!) 160/107 (BP Location: Left Arm)   Pulse 66   Temp 97.9 F (36.6 C) (Oral)   Resp 18   Ht 5\' 11"  (1.803 m)   Wt 97.1 kg (214 lb)   SpO2 97%   BMI 29.85 kg/m   Physical Exam  Constitutional: He appears well-developed and well-nourished. No distress.  HENT:  Head: Normocephalic and atraumatic.  No facial swelling, posterior oropharynx clear  Eyes: Right eye exhibits no discharge. Left eye exhibits no discharge.  Neck: Normal range of motion. Neck supple.  Cardiovascular: Normal rate and regular rhythm.   Pulmonary/Chest: Effort normal and breath sounds normal. No stridor. No respiratory distress. He has no wheezes. He has no rales.  Musculoskeletal: He exhibits no edema.  Neurological: He is alert. Coordination normal.  Skin: He is not diaphoretic.  Four 2-3 cm slightly raised circular areas of erythema with central small vesicle, see photo below, no streaking, no rashes or lesions elsewhere  Psychiatric: He has a normal mood and affect. His behavior is normal.  Nursing note and vitals reviewed.        ED Treatments / Results  Labs (all labs ordered are listed, but only abnormal results are displayed) Labs Reviewed - No data to display  EKG  EKG Interpretation None       Radiology No results found.  Procedures Procedures (including critical care time)  Medications Ordered in ED Medications  diphenhydrAMINE (BENADRYL) capsule 25 mg (25 mg Oral Given 06/10/17 1349)  famotidine (PEPCID) tablet 20 mg (20 mg Oral Given 06/10/17 1349)     Initial Impression / Assessment and Plan / ED Course    I have reviewed the triage vital signs and the nursing notes.  Pertinent labs & imaging results that were available during my care of the patient were reviewed by me and considered in my medical decision making (see chart for details).  Patient presents with rash to left upper arm after insect bites while working in the yard yesterday. Patient well-appearing, vitals on concerning for respiratory distress or anaphylaxis. Lungs clear on exam, no facial swelling. Presentation consistent with local allergic reaction from bug bite. Low suspicion for cellulitis, requiring antibiotics Treated in the ED with Benadryl and Pepcid, patient reports some improvement in symptoms. No stable for discharge home with continuation of Benadryl and Pepcid, return precautions including fever or chills, spreading of redness and other signs of infection provided. Pt expresses understanding and agrees with plan.  Final Clinical Impressions(s) / ED Diagnoses   Final diagnoses:  Insect bite, initial encounter  Allergic reaction, initial encounter    New Prescriptions New Prescriptions   DIPHENHYDRAMINE (BENADRYL) 25 MG CAPSULE    Take 1 capsule (25 mg total) by mouth every 6 (six) hours as needed.   FAMOTIDINE (PEPCID) 20 MG TABLET    Take 1 tablet (20 mg total) by mouth 2 (two) times daily.     Jacqlyn Larsen, PA-C 06/10/17 1436    Blanchie Dessert, MD 06/11/17 2015

## 2017-06-10 NOTE — Discharge Instructions (Signed)
This rash is likely due to an allergic reaction from an insect bite. You can continue to take Benadryl and Pepcid to reduce swelling and itching. If symptoms are not improving, you have spreading of the redness, pain and increased swelling or developed fevers and chills please return to the emergency department for reevaluation, otherwise please follow-up with your primary doctor.

## 2017-07-24 ENCOUNTER — Emergency Department (HOSPITAL_COMMUNITY)
Admission: EM | Admit: 2017-07-24 | Discharge: 2017-07-24 | Disposition: A | Discharge status: Home / Self Care | Payer: 59 | Attending: Emergency Medicine | Admitting: Emergency Medicine

## 2017-07-24 ENCOUNTER — Encounter (HOSPITAL_COMMUNITY): Payer: Self-pay

## 2017-07-24 DIAGNOSIS — I10 Essential (primary) hypertension: Secondary | ICD-10-CM | POA: Diagnosis not present

## 2017-07-24 DIAGNOSIS — I252 Old myocardial infarction: Secondary | ICD-10-CM | POA: Insufficient documentation

## 2017-07-24 DIAGNOSIS — K047 Periapical abscess without sinus: Secondary | ICD-10-CM

## 2017-07-24 DIAGNOSIS — Z79899 Other long term (current) drug therapy: Secondary | ICD-10-CM | POA: Diagnosis not present

## 2017-07-24 DIAGNOSIS — K0889 Other specified disorders of teeth and supporting structures: Secondary | ICD-10-CM | POA: Diagnosis present

## 2017-07-24 MED ORDER — AMOXICILLIN-POT CLAVULANATE 875-125 MG PO TABS: 1.0000 | ORAL_TABLET | Freq: Two times a day (BID) | ORAL | 0 refills | Status: DC

## 2017-07-24 NOTE — ED Triage Notes (Signed)
Per Pt, Pt is coming from home with complaints of right dental pain and swelling that started a couple days ago. Pt reports feeling warm and having some chills.

## 2017-07-24 NOTE — ED Provider Notes (Signed)
[place] EMERGENCY DEPARTMENT Provider Note   CSN: [id number] Arrival date & time: 07/24/17  1234     History   Chief Complaint Chief Complaint  Patient presents with  . Dental Pain    HPI Dylan Gonzalez is a 51 y.o. male.  The history is provided by the patient. No language interpreter was used.  Dental Pain     Dylan Gonzalez is a 51 y.o. male who presents to the Emergency Department complaining of dental pain.  He reports pain to the right side of his face and teeth for the last couple of days.  He had similar symptoms just over a month ago and had a dental abscess and was treated with antibiotics.  He saw his dentist with plan for root canal or feeling but was unable to follow-up due to cost.  He feels a little swollen in that area.  He does have subjective fevers but has not taken his temperature at home.  No nausea, vomiting, difficulty breathing, difficulty swallowing.  He has a history of hypertension but no additional medical problems. Past Medical History:  Diagnosis Date  . Hypertension   . MI (myocardial infarction) [facility])     Patient Active Problem List   Diagnosis Date Noted  . Hypertension 12/26/2011  . 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 EXTRACTION MOLARS Right 04/21/2013   Performed by [name], DDS at [facility] OR  . eye removed    . HERNIA REPAIR    . INTRAOCULAR PROSTHESES INSERTION         Home Medications    Prior to Admission medications   Medication Sig Start Date End Date Taking? Authorizing Provider  amoxicillin-clavulanate (AUGMENTIN) 875-125 MG tablet Take 1 tablet every 12 (twelve) hours by mouth. 07/24/17   Quintella Reichert, MD  baclofen (LIORESAL) 10 MG tablet Take 1 tablet (10 mg total) by mouth 3 (three) times daily. 05/31/17   Margarita Mail, PA-C  cetirizine (ZYRTEC) 10 MG tablet Take 10 mg by mouth daily as needed for allergies.    [provider]  cloNIDine (CATAPRES - DOSED IN MG/24 HR) 0.2 mg/24hr patch APPLY 1 PATCH ONTO THE SKIN ONCE A WEEK. Patient not taking: Reported on 02/22/2017 10/12/15   Robyn Haber, MD  cloNIDine (CATAPRES) 0.2 MG tablet Take 0.2 mg by mouth once a week. 01/07/17   [provider]  diphenhydrAMINE (BENADRYL) 25 mg capsule Take 1 capsule (25 mg total) by mouth every 6 (six) hours as needed. 06/10/17   Jacqlyn Larsen, PA-C  famotidine (PEPCID) 20 MG tablet Take 1 tablet (20 mg total) by mouth 2 (two) times daily. 06/10/17   Jacqlyn Larsen, PA-C  fluticasone (FLONASE) 50 MCG/ACT nasal spray PLACE 2 SPRAYS INTO BOTH NOSTRILS DAILY. Patient not taking: Reported on 02/22/2017 07/22/14   Le, Thao P, DO  hydrALAZINE (APRESOLINE) 25 MG tablet Take 1 tablet (25 mg total) by mouth 2 (two) times daily. Patient not taking: Reported on 02/22/2017 10/12/15   Robyn Haber, MD  hydrOXYzine (VISTARIL) 100 MG capsule TAKE ONE CAPSULE BY MOUTH AT BEDTIME AS NEEDED FOR ALLERGY Patient not taking: Reported on 02/22/2017 10/12/15   Robyn Haber, MD  losartan-hydrochlorothiazide (HYZAAR) 100-25 MG tablet TAKE 1 TABLET BY MOUTH EVERY DAY (MUST HAVE OFFICE VISIT FOR FURTHER REFILLS) 12/27/16   Harrison Mons, PA-C  meloxicam (MOBIC) 15 MG tablet Take 1 tablet (15 mg total) by mouth daily.  05/31/17   Margarita Mail, PA-C  methylPREDNISolone (MEDROL DOSEPAK) 4 MG TBPK tablet Take as directed 02/22/17   Ocie Cornfield T, PA-C  metoprolol succinate (TOPROL-XL) 25 MG 24 hr tablet TAKE 1 TABLET BY MOUTH EVERY DAY. Patient not taking: Reported on 02/22/2017 10/21/16   Wendie Agreste, MD  metoprolol succinate (TOPROL-XL) 25 MG 24 hr tablet TAKE 1 TABLET BY MOUTH EVERY DAY. 12/19/16   Tereasa Coop, PA-C  Multiple Vitamin (MULTIVITAMIN) tablet Take 1 tablet by mouth daily.    [provider]  spironolactone (ALDACTONE) 25 MG tablet Take 1 tablet (25 mg total) by mouth daily. 10/12/15   Robyn Haber, MD     Family History Family History  Problem Relation Age of Onset  . Hypertension Father   . Depression Brother     Social History Social History   Tobacco Use  . Smoking status: Never Smoker  . Smokeless tobacco: Never Used  Substance Use Topics  . Alcohol use: No  . Drug use: No     Allergies   Pollen extract   Review of Systems Review of Systems  All other systems reviewed and are negative.    Physical Exam Updated Vital Signs BP (!) 135/102 (BP Location: Right Arm)   Pulse 72   Temp 97.9 F (36.6 C) (Oral)   Resp 16   Ht 5\' 11"  (1.803 m)   Wt 96.2 kg (212 lb)   SpO2 98%   BMI 29.57 kg/m   Physical Exam  Constitutional: He is oriented to person, place, and time. He appears well-developed and well-nourished.  HENT:  Head: Normocephalic and atraumatic.  Mouth/Throat:    There is mild tenderness to palpation over the right maxillary sinus with no appreciable swelling.  Eyes:  Right eye with pupil round and reactive to light.  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  Neck: Neck supple.  Cardiovascular: Normal rate and regular rhythm.  No murmur heard. Pulmonary/Chest: Effort normal and breath sounds normal. No stridor. No respiratory distress.  Musculoskeletal: He exhibits no edema or tenderness.  Neurological: He is alert and oriented to person, place, and time.  Skin: Skin is warm and dry.  Psychiatric: He has a normal mood and affect. His behavior is normal.  Nursing note and vitals reviewed.    ED Treatments / Results  Labs (all labs ordered are listed, but only abnormal results are displayed) Labs Reviewed - No data to display  EKG  EKG Interpretation None       Radiology No results found.  Procedures Procedures (including critical care time)  Medications Ordered in ED Medications - No data to display   Initial Impression / Assessment and Plan / ED Course  I have reviewed the triage vital signs and the nursing notes.  Pertinent labs &  imaging results that were available during my care of the patient were reviewed by me and considered in my medical decision making (see chart for details).     Patient here for evaluation of a dental pain and facial swelling.  He is well-appearing on examination and nontoxic.  No clinical evidence of dehydration or airway involvement.  Discussed with patient home care for dental pain and abscess with antibiotics.  Discussed importance of dentistry follow-up.  Home care return precautions discussed.  Final Clinical Impressions(s) / ED Diagnoses   Final diagnoses:  Pain, dental  Dental abscess    ED Discharge Orders        Ordered    amoxicillin-clavulanate (AUGMENTIN) 875-125 MG  tablet  Every 12 hours     07/24/17 1558       Quintella Reichert, MD 07/24/17 [phone]

## 2017-08-04 ENCOUNTER — Encounter (HOSPITAL_COMMUNITY): Payer: Self-pay | Admitting: Emergency Medicine

## 2017-08-04 ENCOUNTER — Emergency Department (HOSPITAL_COMMUNITY)
Admission: EM | Admit: 2017-08-04 | Discharge: 2017-08-05 | Disposition: A | Discharge status: Home / Self Care | Payer: 59 | Attending: Emergency Medicine | Admitting: Emergency Medicine

## 2017-08-04 DIAGNOSIS — I10 Essential (primary) hypertension: Secondary | ICD-10-CM | POA: Diagnosis not present

## 2017-08-04 DIAGNOSIS — L509 Urticaria, unspecified: Secondary | ICD-10-CM | POA: Insufficient documentation

## 2017-08-04 DIAGNOSIS — R21 Rash and other nonspecific skin eruption: Secondary | ICD-10-CM | POA: Diagnosis present

## 2017-08-04 DIAGNOSIS — M6740 Ganglion, unspecified site: Secondary | ICD-10-CM

## 2017-08-04 DIAGNOSIS — Z23 Encounter for immunization: Secondary | ICD-10-CM | POA: Insufficient documentation

## 2017-08-04 DIAGNOSIS — Z79899 Other long term (current) drug therapy: Secondary | ICD-10-CM | POA: Insufficient documentation

## 2017-08-04 NOTE — ED Triage Notes (Signed)
Patient reports itchy skin rashes at left arm onset yesterday , pt. added small cyst at left hand for several weeks .

## 2017-08-04 NOTE — ED Notes (Signed)
Pt calls out wanting to know when he will be seen by something other than the nurse

## 2017-08-05 MED ORDER — TRIAMCINOLONE ACETONIDE 0.1 % EX CREA: 1.0000 "application " | TOPICAL_CREAM | Freq: Two times a day (BID) | CUTANEOUS | 0 refills | Status: DC

## 2017-08-05 MED ORDER — HYDROCORTISONE 1 % EX CREA: TOPICAL_CREAM | CUTANEOUS | 0 refills | Status: DC

## 2017-08-05 MED ORDER — PREDNISONE 10 MG PO TABS: 20.0000 mg | ORAL_TABLET | Freq: Two times a day (BID) | ORAL | 0 refills | Status: AC

## 2017-08-05 MED ORDER — PREDNISONE 10 MG PO TABS: 20.0000 mg | ORAL_TABLET | Freq: Two times a day (BID) | ORAL | 0 refills | Status: DC

## 2017-08-05 MED ORDER — TETANUS-DIPHTH-ACELL PERTUSSIS 5-2.5-18.5 LF-MCG/0.5 IM SUSP
0.5000 mL | Freq: Once | INTRAMUSCULAR | Status: AC
  Administered 2017-08-05: 0.5 mL via INTRAMUSCULAR
  Filled 2017-08-05: qty 0.5

## 2017-08-05 NOTE — ED Provider Notes (Signed)
[place] EMERGENCY DEPARTMENT Provider Note   CSN: [id number] Arrival date & time: 08/04/17  1958     History   Chief Complaint Chief Complaint  Patient presents with  . Rash    Hand cyst    HPI Dylan Gonzalez is a 51 y.o. male.  HPI   Patient is a 34-year male with a history of hypertension and MI presenting for a rash on his left arm as well as a "lump" on his left dorsal hand.  Patient reports he was at work and noted red bumps appear in 2 spots on his left arm.  These were initially pruritic, however the pruritus has improved.  Patient denies any shortness of breath, chest pain, lingual swelling, angioedema, wheezing, or abdominal cramping.  No new foods to patient's knowledge that were ingested, and no new detergents, soaps, colognes.  The rash has not spread from the 2 spots.  Patient reports he has had the lump on his left dorsal hand for a couple months.  It is nonpainful.  It is growing slightly in size.  No paresthesias or weakness of the hand surrounding this lump.  Past Medical History:  Diagnosis Date  . Hypertension   . MI (myocardial infarction) [facility])     Patient Active Problem List   Diagnosis Date Noted  . Hypertension 12/26/2011  . 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 eye removed    . HERNIA REPAIR    . INTRAOCULAR PROSTHESES INSERTION    . TOOTH EXTRACTION Right 04/21/2013   Procedure: EXTRACTION MOLARS;  Surgeon: [name], DDS;  Location: [place];  Service: Oral Surgery;  Laterality: Right;       Home Medications    Prior to Admission medications   Medication Sig Start Date End Date Taking? Authorizing Provider  amoxicillin-clavulanate (AUGMENTIN) 875-125 MG tablet Take 1 tablet every 12 (twelve) hours by mouth. 07/24/17   Quintella Reichert, MD  baclofen (LIORESAL) 10 MG tablet Take 1 tablet (10 mg total) by mouth 3 (three) times daily. 05/31/17   Margarita Mail, PA-C  cetirizine (ZYRTEC) 10 MG tablet Take 10 mg by mouth daily as needed for allergies.    [provider]  cloNIDine (CATAPRES - DOSED IN MG/24 HR) 0.2 mg/24hr patch APPLY 1 PATCH ONTO THE SKIN ONCE A WEEK. Patient not taking: Reported on 02/22/2017 10/12/15   Robyn Haber, MD  cloNIDine (CATAPRES) 0.2 MG tablet Take 0.2 mg by mouth once a week. 01/07/17   [provider]  diphenhydrAMINE (BENADRYL) 25 mg capsule Take 1 capsule (25 mg total) by mouth every 6 (six) hours as needed. 06/10/17   Jacqlyn Larsen, PA-C  famotidine (PEPCID) 20 MG tablet Take 1 tablet (20 mg total) by mouth 2 (two) times daily. 06/10/17   Jacqlyn Larsen, PA-C  fluticasone (FLONASE) 50 MCG/ACT nasal spray PLACE 2 SPRAYS INTO BOTH NOSTRILS DAILY. Patient not taking: Reported on 02/22/2017 07/22/14   Le, Thao P, DO  hydrALAZINE (APRESOLINE) 25 MG tablet Take 1 tablet (25 mg total) by mouth 2 (two) times daily. Patient not taking: Reported on 02/22/2017 10/12/15   Robyn Haber, MD  hydrocortisone cream 1 % Apply to affected area 2 times daily 08/05/17   Langston Masker B, PA-C  hydrOXYzine (VISTARIL) 100 MG capsule TAKE ONE CAPSULE BY MOUTH AT BEDTIME AS NEEDED FOR ALLERGY Patient not taking: Reported on 02/22/2017 10/12/15   Robyn Haber, MD  losartan-hydrochlorothiazide (  HYZAAR) 100-25 MG tablet TAKE 1 TABLET BY MOUTH EVERY DAY (MUST HAVE OFFICE VISIT FOR FURTHER REFILLS) 12/27/16   Harrison Mons, PA-C  meloxicam (MOBIC) 15 MG tablet Take 1 tablet (15 mg total) by mouth daily. 05/31/17   Margarita Mail, PA-C  methylPREDNISolone (MEDROL DOSEPAK) 4 MG TBPK tablet Take as directed 02/22/17   Ocie Cornfield T, PA-C  metoprolol succinate (TOPROL-XL) 25 MG 24 hr tablet TAKE 1 TABLET BY MOUTH EVERY DAY. Patient not taking: Reported on 02/22/2017 10/21/16   Wendie Agreste, MD  metoprolol succinate (TOPROL-XL) 25 MG 24 hr tablet TAKE 1 TABLET BY MOUTH EVERY DAY. 12/19/16   Tereasa Coop, PA-C    Multiple Vitamin (MULTIVITAMIN) tablet Take 1 tablet by mouth daily.    [provider]  predniSONE (DELTASONE) 10 MG tablet Take 2 tablets (20 mg total) by mouth 2 (two) times daily with a meal for 4 days. 08/05/17 08/09/17  Langston Masker B, PA-C  spironolactone (ALDACTONE) 25 MG tablet Take 1 tablet (25 mg total) by mouth daily. 10/12/15   Robyn Haber, MD    Family History Family History  Problem Relation Age of Onset  . Hypertension Father   . Depression Brother     Social History Social History   Tobacco Use  . Smoking status: Never Smoker  . Smokeless tobacco: Never Used  Substance Use Topics  . Alcohol use: No  . Drug use: No     Allergies   Pollen extract   Review of Systems Review of Systems  HENT: Negative for facial swelling, trouble swallowing and voice change.   Respiratory: Negative for shortness of breath and wheezing.   Cardiovascular: Negative for chest pain.  Gastrointestinal: Negative for abdominal pain.  Skin: Positive for rash.     Physical Exam Updated Vital Signs BP (!) 165/105   Pulse 65   Temp 97.9 F (36.6 C) (Oral)   Resp 16   SpO2 100%   Physical Exam  Constitutional: He appears well-developed and well-nourished. No distress.  Sitting comfortably in bed.  HENT:  Head: Normocephalic and atraumatic.  Eyes: Conjunctivae are normal. Right eye exhibits no discharge. Left eye exhibits no discharge.  EOMs normal to gross examination.  Neck: Normal range of motion.  Cardiovascular: Normal rate, regular rhythm and normal heart sounds.  Intact, 2+ radial pulse of left hand.  Pulmonary/Chest: Effort normal and breath sounds normal. He has no wheezes.  Normal respiratory effort. Patient converses comfortably. No audible wheeze or stridor.  Lungs clear wheezing.  Abdominal: He exhibits no distension.  Musculoskeletal: Normal range of motion.  Neurological: He is alert.  Cranial nerves intact to gross observation. Patient moves  extremities without difficulty.  Skin: Skin is warm and dry. He is not diaphoretic.  There are 2 patches of urticaria present on the left arm.  There are none noted on the thorax or back.  No lesions of the abdomen.  No other lesions on other extremities.  Lesions nontender to touch. The left dorsal hand demonstrates a fluid-filled cystic structure.  Psychiatric: He has a normal mood and affect. His behavior is normal. Judgment and thought content normal.  Nursing note and vitals reviewed.      ED Treatments / Results  Labs (all labs ordered are listed, but only abnormal results are displayed) Labs Reviewed - No data to display  EKG  EKG Interpretation None       Radiology No results found.  Procedures Procedures (including critical care time)  Medications  Ordered in ED Medications  Tdap (BOOSTRIX) injection 0.5 mL (0.5 mLs Intramuscular Given 08/05/17 0024)     Initial Impression / Assessment and Plan / ED Course  I have reviewed the triage vital signs and the nursing notes.  Pertinent labs & imaging results that were available during my care of the patient were reviewed by me and considered in my medical decision making (see chart for details).     Final Clinical Impressions(s) / ED Diagnoses   Final diagnoses:  Urticaria  Ganglion cyst   Patient is nontoxic-appearing, afebrile, and in no acute distress.  Examination is consistent with urticaria, however patient has been observed for greater than 3 hours in the emergency department without any change and is noted to have improvement in this rash.  There is no evidence of anaphylaxis.  Patient to be treated on 4 days of prednisone therapy as well as triamcinolone cream for relief.  Additionally, patient noted to have a cyst on his left dorsal hand consistent with a ganglion cyst.  I counseled that this is not harmful can be followed up with his primary care provider.  Patient was requesting a tetanus shot today.  This  is not indicated based on clinical picture, but will update to keep immunizations up-to-date for patient.  Return precautions given for any angioedema, abdominal cramping, wheezing.  Patient is understanding and agrees with the plan of care.  Blood pressure elevated on 2 accounts emergency department today.  Patient has a history of hypertension.  Patient to follow-up with his primary care regarding this reading.   Albesa Seen, PA-C 08/05/17 1715    Davonna Belling, MD 08/06/17 2348

## 2017-08-05 NOTE — Discharge Instructions (Signed)
Please read and follow all provided instructions.  Your diagnoses today include:  1. Urticaria   2. Ganglion cyst    Your exam is suggestive of hives.  We do not always know the cause of this.  It can come from ingesting something or something touching her skin that you are allergic to.  Your exam is not suggestive of poison ivy which is reassuring.  It is also reassuring that this is not worsened while in the Ed.  The cyst on your hand is likely a ganglion cyst.  These growing areas were you have moving tendons and they feel like they have a jelly like fluid in them.  These are not harmful.  Please follow-up with your primary care provider about this if you have concerns.  Tests performed today include: Vital signs. See below for your results today.   Medications prescribed:   Take any prescribed medications only as directed.  You are prescribed prednisone, a steroid in the ED for hives. This is a medication to help reduce inflammation in the skin.  Common side effects include upset stomach/nausea. You may take this medicine with food if this occurs. Other side effects include restlessness, difficulty sleeping, and increased sweating. Call your healthcare provider if these do not resolve after finishing the medication.  This medicine may increase your blood sugar so additional careful monitoring is needed of blood sugar if you have diabetes. Call your healthcare provider for any signs/symtpoms of high blood sugar such as confusion, feeling sleepy, more thirst, more hunger, passing urine more often, flushing, fast breathing, or breath that smells like fruit.  I also prescribed Kenalog cream.  You may apply this if you are still having itching.  Home care instructions:  Follow any educational materials contained in this packet  Follow-up instructions: Please follow-up with your primary care provider in the next 3 days for further evaluation of your symptoms.   Return instructions:    Please return to the Emergency Department if you experience worsening symptoms.  Call 9-1-1 immediately if you have an allergic reaction that involves your lips, mouth, throat or if you have any difficulty breathing. This is a life-threatening emergency.  Please return if you have any other emergent concerns.  Additional Information:  Your vital signs today were: BP (!) 155/109 (BP Location: Right Arm)    Pulse 79    Temp 97.9 F (36.6 C) (Oral)    Resp 17    SpO2 95%  If your blood pressure (BP) was elevated above 130/80 this visit, please have this repeated by your doctor within one month. --------------  Thank you for allowing [place] to participate in your care today!

## 2017-11-16 ENCOUNTER — Ambulatory Visit (HOSPITAL_COMMUNITY)
Admission: EM | Admit: 2017-11-16 | Discharge: 2017-11-16 | Disposition: A | Discharge status: Home / Self Care | Payer: 59 | Attending: Family Medicine | Admitting: Family Medicine

## 2017-11-16 ENCOUNTER — Other Ambulatory Visit: Payer: Self-pay

## 2017-11-16 ENCOUNTER — Encounter (HOSPITAL_COMMUNITY): Payer: Self-pay | Admitting: Emergency Medicine

## 2017-11-16 DIAGNOSIS — N4889 Other specified disorders of penis: Secondary | ICD-10-CM | POA: Diagnosis not present

## 2017-11-16 MED ORDER — FLUCONAZOLE 150 MG PO TABS: 150.0000 mg | ORAL_TABLET | Freq: Every morning | ORAL | 0 refills | Status: DC

## 2017-11-16 NOTE — ED Provider Notes (Signed)
[facility]   [id number] 11/16/17 Arrival Time: 1331   SUBJECTIVE:  Dylan Gonzalez is a 52 y.o. male who presents to the urgent care with complaint of several bumps on his penis that he noticed yesterday when getting out of the shower and noticed he had a little blood on his towel.  He states the blood is coming from one of the bumps and it will stop with a cold compress.  Past Medical History:  Diagnosis Date  . Hypertension   . MI (myocardial infarction) ([place])    Family History  Problem Relation Age of Onset  . Hypertension Father   . Depression Brother    Social History   Socioeconomic History  . Marital status: Married    Spouse name: Not on file  . Number of children: Not on file  . Years of education: Not on file  . Highest education level: Not on file  Social Needs  . Financial resource strain: Not on file  . Food insecurity - worry: Not on file  . Food insecurity - inability: Not on file  . Transportation needs - medical: Not on file  . Transportation needs - non-medical: Not on file  Occupational History  . Not on file  Tobacco Use  . Smoking status: Never Smoker  . Smokeless tobacco: Never Used  Substance and Sexual Activity  . Alcohol use: No  . Drug use: No  . Sexual activity: Not Currently  Other Topics Concern  . Not on file  Social History Narrative  . Not on file   Current Meds  Medication Sig  . cetirizine (ZYRTEC) 10 MG tablet Take 10 mg by mouth daily as needed for allergies.  . diphenhydrAMINE (BENADRYL) 25 mg capsule Take 1 capsule (25 mg total) by mouth every 6 (six) hours as needed.  . fluticasone (FLONASE) 50 MCG/ACT nasal spray PLACE 2 SPRAYS INTO BOTH NOSTRILS DAILY.  Marland Kitchen losartan-hydrochlorothiazide (HYZAAR) 100-25 MG tablet TAKE 1 TABLET BY MOUTH EVERY DAY (MUST HAVE OFFICE VISIT FOR FURTHER REFILLS)  . Multiple Vitamin (MULTIVITAMIN) tablet Take 1 tablet by mouth daily.  Marland Kitchen spironolactone (ALDACTONE) 25 MG tablet Take 1  tablet (25 mg total) by mouth daily.  . [DISCONTINUED] metoprolol succinate (TOPROL-XL) 25 MG 24 hr tablet TAKE 1 TABLET BY MOUTH EVERY DAY.   Allergies  Allergen Reactions  . Pollen Extract Other (See Comments)    Sneezing and shortness of breath      ROS: As per HPI, remainder of ROS negative.   OBJECTIVE:   Vitals:   11/16/17 1424  BP: (!) 141/78  Pulse: 69  Temp: 98 F (36.7 C)  TempSrc: Oral  SpO2: 100%     General appearance: alert; no distress Eyes: left false eye; conjunctiva normal HENT: normocephalic; atraumatic; TMs normal, canal normal, external ears normal without trauma; nasal mucosa normal; oral mucosa normal Neck: supple Skin: warm and dry; two small 1 mm papules on glans penis Neurologic: normal gait; grossly normal Psychological: alert and cooperative; normal mood and affect      Labs:  Results for orders placed or performed during the hospital encounter of 60/73/71  Basic metabolic panel  Result Value Ref Range   Sodium 141 135 - 145 mmol/L   Potassium 3.7 3.5 - 5.1 mmol/L   Chloride 104 101 - 111 mmol/L   CO2 30 22 - 32 mmol/L   Glucose, Bld 98 65 - 99 mg/dL   BUN 17 6 - 20 mg/dL   Creatinine, Ser  1.20 0.61 - 1.24 mg/dL   Calcium 9.2 8.9 - 10.3 mg/dL   GFR calc non Af Amer >60 >60 mL/min   GFR calc Af Amer >60 >60 mL/min   Anion gap 7 5 - 15  CBC with Differential  Result Value Ref Range   WBC 7.4 4.0 - 10.5 K/uL   RBC 4.21 (L) 4.22 - 5.81 MIL/uL   Hemoglobin 13.0 13.0 - 17.0 g/dL   HCT 40.3 39.0 - 52.0 %   MCV 95.7 78.0 - 100.0 fL   MCH 30.9 26.0 - 34.0 pg   MCHC 32.3 30.0 - 36.0 g/dL   RDW 12.8 11.5 - 15.5 %   Platelets 224 150 - 400 K/uL   Neutrophils Relative % 59 %   Neutro Abs 4.4 1.7 - 7.7 K/uL   Lymphocytes Relative 29 %   Lymphs Abs 2.2 0.7 - 4.0 K/uL   Monocytes Relative 8 %   Monocytes Absolute 0.6 0.1 - 1.0 K/uL   Eosinophils Relative 4 %   Eosinophils Absolute 0.3 0.0 - 0.7 K/uL   Basophils Relative 0 %    Basophils Absolute 0.0 0.0 - 0.1 K/uL  I-stat troponin, ED  Result Value Ref Range   Troponin i, poc 0.00 0.00 - 0.08 ng/mL   Comment 3            Labs Reviewed - No data to display  No results found.     ASSESSMENT & PLAN:  1. Penile papules     Meds ordered this encounter  Medications  . fluconazole (DIFLUCAN) 150 MG tablet    Sig: Take 1 tablet (150 mg total) by mouth every morning. Repeat if needed    Dispense:  7 tablet    Refill:  0    Reviewed expectations re: course of current medical issues. Questions answered. Outlined signs and symptoms indicating need for more acute intervention. Patient verbalized understanding. After Visit Summary given.    Procedures:      Robyn Haber, MD 11/16/17 [phone]

## 2017-11-16 NOTE — ED Triage Notes (Signed)
Pt states he has several bumps on his penis that he noticed yesterday when getting out of the shower and noticed he had a little blood on his towel.  He states the blood is coming from one of the bumps and it will stop with a cold compress.

## 2018-03-29 ENCOUNTER — Other Ambulatory Visit: Payer: Self-pay

## 2018-03-29 ENCOUNTER — Encounter (HOSPITAL_COMMUNITY): Payer: Self-pay

## 2018-03-29 ENCOUNTER — Emergency Department (HOSPITAL_COMMUNITY)
Admission: EM | Admit: 2018-03-29 | Discharge: 2018-03-30 | Disposition: A | Discharge status: Home / Self Care | Payer: BLUE CROSS/BLUE SHIELD | Attending: Emergency Medicine | Admitting: Emergency Medicine

## 2018-03-29 DIAGNOSIS — Z79899 Other long term (current) drug therapy: Secondary | ICD-10-CM | POA: Diagnosis not present

## 2018-03-29 DIAGNOSIS — I252 Old myocardial infarction: Secondary | ICD-10-CM | POA: Diagnosis not present

## 2018-03-29 DIAGNOSIS — I10 Essential (primary) hypertension: Secondary | ICD-10-CM | POA: Insufficient documentation

## 2018-03-29 DIAGNOSIS — R310 Gross hematuria: Secondary | ICD-10-CM | POA: Diagnosis not present

## 2018-03-29 DIAGNOSIS — R319 Hematuria, unspecified: Secondary | ICD-10-CM | POA: Insufficient documentation

## 2018-03-29 LAB — URINALYSIS, ROUTINE W REFLEX MICROSCOPIC
BACTERIA UA: NONE SEEN
BILIRUBIN URINE: NEGATIVE
Glucose, UA: NEGATIVE mg/dL
KETONES UR: NEGATIVE mg/dL
LEUKOCYTES UA: NEGATIVE
Nitrite: NEGATIVE
Protein, ur: NEGATIVE mg/dL
RBC / HPF: >50 RBC/hpf — ABNORMAL HIGH (ref 0–5)
Specific Gravity, Urine: 1.02 (ref 1.005–1.030)
pH: 5 (ref 5.0–8.0)

## 2018-03-29 NOTE — ED Triage Notes (Signed)
Pt states that he noticed hematuria with some clots about 30 minutes ago. Some dysuria, denies fevers.

## 2018-03-30 NOTE — ED Provider Notes (Signed)
[facility] EMERGENCY DEPARTMENT Provider Note   CSN: [id number] Arrival date & time: 03/29/18  2239     History   Chief Complaint Chief Complaint  Patient presents with  . Hematuria    HPI Dylan Gonzalez is a 52 y.o. male.  Patient presents with concern for having seen blood in his urine tonight just prior to coming to the hospital. He experienced mild discomfort with urination. No penile discharge, scrotal swelling, fever, N, V or testicular pain. He has urinated since and did not see any blood.   The history is provided by the patient. No language interpreter was used.  Hematuria  Pertinent negatives include no abdominal pain.    Past Medical History:  Diagnosis Date  . Hypertension   . MI (myocardial infarction) [facility])     Patient Active Problem List   Diagnosis Date Noted  . Hypertension 12/26/2011  . 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 eye removed    . HERNIA REPAIR    . INTRAOCULAR PROSTHESES INSERTION    . TOOTH EXTRACTION Right 04/21/2013   Procedure: EXTRACTION MOLARS;  Surgeon: [name], DDS;  Location: [facility];  Service: Oral Surgery;  Laterality: Right;        Home Medications    Prior to Admission medications   Medication Sig Start Date End Date Taking? Authorizing Provider  cetirizine (ZYRTEC) 10 MG tablet Take 10 mg by mouth daily as needed for allergies.    [provider]  diphenhydrAMINE (BENADRYL) 25 mg capsule Take 1 capsule (25 mg total) by mouth every 6 (six) hours as needed. 06/10/17   Jacqlyn Larsen, PA-C  famotidine (PEPCID) 20 MG tablet Take 1 tablet (20 mg total) by mouth 2 (two) times daily. 06/10/17   Jacqlyn Larsen, PA-C  fluconazole (DIFLUCAN) 150 MG tablet Take 1 tablet (150 mg total) by mouth every morning. Repeat if needed 11/16/17   Robyn Haber, MD  fluticasone (FLONASE) 50 MCG/ACT nasal spray PLACE 2 SPRAYS INTO BOTH NOSTRILS  DAILY. 07/22/14   Le, Thao P, DO  hydrocortisone cream 1 % Apply to affected area 2 times daily 08/05/17   Langston Masker B, PA-C  hydrOXYzine (VISTARIL) 100 MG capsule TAKE ONE CAPSULE BY MOUTH AT BEDTIME AS NEEDED FOR ALLERGY 10/12/15   Robyn Haber, MD  losartan-hydrochlorothiazide (HYZAAR) 100-25 MG tablet TAKE 1 TABLET BY MOUTH EVERY DAY (MUST HAVE OFFICE VISIT FOR FURTHER REFILLS) 12/27/16   Harrison Mons, PA-C  Multiple Vitamin (MULTIVITAMIN) tablet Take 1 tablet by mouth daily.    [provider]  spironolactone (ALDACTONE) 25 MG tablet Take 1 tablet (25 mg total) by mouth daily. 10/12/15   Robyn Haber, MD    Family History Family History  Problem Relation Age of Onset  . Hypertension Father   . Depression Brother     Social History Social History   Tobacco Use  . Smoking status: Never Smoker  . Smokeless tobacco: Never Used  Substance Use Topics  . Alcohol use: No  . Drug use: No     Allergies   Pollen extract   Review of Systems Review of Systems  Constitutional: Negative for chills and fever.  Gastrointestinal: Negative.  Negative for abdominal pain and nausea.  Genitourinary: Positive for hematuria. Negative for discharge, scrotal swelling and testicular pain.  Musculoskeletal: Negative.   Skin: Negative.   Neurological: Negative.  Negative for weakness.     Physical Exam  Updated Vital Signs BP (!) 148/109   Pulse 69   Temp 98.1 F (36.7 C) (Oral)   Resp 20   SpO2 100%   Physical Exam  Constitutional: He is oriented to person, place, and time. He appears well-developed and well-nourished.  Neck: Normal range of motion.  Pulmonary/Chest: Effort normal.  Genitourinary:  Genitourinary Comments: Circumcised. No discharge or bleeding at penile meatus. No scrotal swelling or testicular tenderness or mass. Prostate is not enlarged and is nontender.   Musculoskeletal: Normal range of motion.  Neurological: He is alert and oriented to  person, place, and time.  Skin: Skin is warm and dry.  Psychiatric: He has a normal mood and affect.     ED Treatments / Results  Labs (all labs ordered are listed, but only abnormal results are displayed) Labs Reviewed  URINALYSIS, ROUTINE W REFLEX MICROSCOPIC - Abnormal; Notable for the following components:      Result Value   APPearance HAZY (*)    Hgb urine dipstick LARGE (*)    RBC / HPF >50 (*)    All other components within normal limits  URINE CULTURE    EKG None  Radiology No results found.  Procedures Procedures (including critical care time)  Medications Ordered in ED Medications - No data to display   Initial Impression / Assessment and Plan / ED Course  I have reviewed the triage vital signs and the nursing notes.  Pertinent labs & imaging results that were available during my care of the patient were reviewed by me and considered in my medical decision making (see chart for details).     Patient here for evaluation of hematuria x 1 episode tonight. No other symptoms. No history of the same. There is blood in his urine here on lab testing but no strong evidence of infection.   He can be discharged home with urology referral. Discussed importance of follow up.    Final Clinical Impressions(s) / ED Diagnoses   Final diagnoses:  None   1. Hematuria  ED Discharge Orders    None       Charlann Lange, PA-C 40/98/11 [id number]    Delora Fuel, MD 82/95/62 [phone]

## 2018-03-31 LAB — URINE CULTURE: Culture: NO GROWTH

## 2018-04-12 ENCOUNTER — Encounter (HOSPITAL_COMMUNITY): Payer: Self-pay | Admitting: *Deleted

## 2018-04-12 ENCOUNTER — Other Ambulatory Visit: Payer: Self-pay | Admitting: Urology

## 2018-04-12 ENCOUNTER — Other Ambulatory Visit: Payer: Self-pay

## 2018-04-12 DIAGNOSIS — R3915 Urgency of urination: Secondary | ICD-10-CM | POA: Diagnosis not present

## 2018-04-12 DIAGNOSIS — R310 Gross hematuria: Secondary | ICD-10-CM | POA: Diagnosis not present

## 2018-04-12 DIAGNOSIS — D400 Neoplasm of uncertain behavior of prostate: Secondary | ICD-10-CM | POA: Diagnosis not present

## 2018-04-12 DIAGNOSIS — N182 Chronic kidney disease, stage 2 (mild): Secondary | ICD-10-CM | POA: Diagnosis not present

## 2018-04-13 ENCOUNTER — Encounter (HOSPITAL_COMMUNITY): Payer: Self-pay | Admitting: *Deleted

## 2018-04-13 ENCOUNTER — Encounter (HOSPITAL_COMMUNITY)
Admission: RE | Disposition: A | Discharge status: Home / Self Care | Payer: Self-pay | Source: Ambulatory Visit | Attending: Urology

## 2018-04-13 ENCOUNTER — Inpatient Hospital Stay (HOSPITAL_COMMUNITY)
Admission: RE | Admit: 2018-04-13 | Discharge: 2018-04-19 | DRG: 713 | Disposition: A | Discharge status: Home / Self Care | Payer: BLUE CROSS/BLUE SHIELD | Attending: Urology | Admitting: Urology

## 2018-04-13 ENCOUNTER — Observation Stay (HOSPITAL_COMMUNITY): Payer: BLUE CROSS/BLUE SHIELD

## 2018-04-13 ENCOUNTER — Other Ambulatory Visit: Payer: Self-pay

## 2018-04-13 ENCOUNTER — Ambulatory Visit (HOSPITAL_COMMUNITY): Payer: BLUE CROSS/BLUE SHIELD | Admitting: Anesthesiology

## 2018-04-13 ENCOUNTER — Ambulatory Visit (HOSPITAL_COMMUNITY): Payer: BLUE CROSS/BLUE SHIELD

## 2018-04-13 DIAGNOSIS — C61 Malignant neoplasm of prostate: Secondary | ICD-10-CM | POA: Diagnosis not present

## 2018-04-13 DIAGNOSIS — N131 Hydronephrosis with ureteral stricture, not elsewhere classified: Secondary | ICD-10-CM | POA: Diagnosis not present

## 2018-04-13 DIAGNOSIS — R1900 Intra-abdominal and pelvic swelling, mass and lump, unspecified site: Secondary | ICD-10-CM | POA: Diagnosis not present

## 2018-04-13 DIAGNOSIS — Z419 Encounter for procedure for purposes other than remedying health state, unspecified: Secondary | ICD-10-CM

## 2018-04-13 DIAGNOSIS — Z79899 Other long term (current) drug therapy: Secondary | ICD-10-CM

## 2018-04-13 DIAGNOSIS — D494 Neoplasm of unspecified behavior of bladder: Secondary | ICD-10-CM | POA: Diagnosis not present

## 2018-04-13 DIAGNOSIS — N3289 Other specified disorders of bladder: Secondary | ICD-10-CM | POA: Diagnosis present

## 2018-04-13 DIAGNOSIS — K5900 Constipation, unspecified: Secondary | ICD-10-CM | POA: Diagnosis not present

## 2018-04-13 DIAGNOSIS — I252 Old myocardial infarction: Secondary | ICD-10-CM | POA: Diagnosis not present

## 2018-04-13 DIAGNOSIS — D4959 Neoplasm of unspecified behavior of other genitourinary organ: Secondary | ICD-10-CM | POA: Diagnosis present

## 2018-04-13 DIAGNOSIS — N179 Acute kidney failure, unspecified: Secondary | ICD-10-CM | POA: Diagnosis not present

## 2018-04-13 DIAGNOSIS — I129 Hypertensive chronic kidney disease with stage 1 through stage 4 chronic kidney disease, or unspecified chronic kidney disease: Secondary | ICD-10-CM | POA: Diagnosis present

## 2018-04-13 DIAGNOSIS — R310 Gross hematuria: Secondary | ICD-10-CM | POA: Diagnosis present

## 2018-04-13 DIAGNOSIS — N183 Chronic kidney disease, stage 3 (moderate): Secondary | ICD-10-CM | POA: Diagnosis present

## 2018-04-13 DIAGNOSIS — I10 Essential (primary) hypertension: Secondary | ICD-10-CM | POA: Diagnosis present

## 2018-04-13 DIAGNOSIS — C494 Malignant neoplasm of connective and soft tissue of abdomen: Secondary | ICD-10-CM

## 2018-04-13 DIAGNOSIS — C7800 Secondary malignant neoplasm of unspecified lung: Secondary | ICD-10-CM

## 2018-04-13 DIAGNOSIS — N135 Crossing vessel and stricture of ureter without hydronephrosis: Secondary | ICD-10-CM | POA: Diagnosis present

## 2018-04-13 DIAGNOSIS — N403 Nodular prostate with lower urinary tract symptoms: Secondary | ICD-10-CM | POA: Diagnosis not present

## 2018-04-13 HISTORY — PX: CYSTOSCOPY W/ URETERAL STENT PLACEMENT: SHX1429

## 2018-04-13 HISTORY — PX: PROSTATE BIOPSY: SHX241

## 2018-04-13 HISTORY — PX: TRANSURETHRAL RESECTION OF PROSTATE: SHX73

## 2018-04-13 LAB — CBC
HCT: 40 % (ref 39.0–52.0)
Hemoglobin: 13.2 g/dL (ref 13.0–17.0)
MCH: 31.4 pg (ref 26.0–34.0)
MCHC: 33 g/dL (ref 30.0–36.0)
MCV: 95 fL (ref 78.0–100.0)
PLATELETS: 243 10*3/uL (ref 150–400)
RBC: 4.21 MIL/uL — AB (ref 4.22–5.81)
RDW: 12.5 % (ref 11.5–15.5)
WBC: 5.5 10*3/uL (ref 4.0–10.5)

## 2018-04-13 LAB — BASIC METABOLIC PANEL
Anion gap: 11 (ref 5–15)
BUN: 19 mg/dL (ref 6–20)
CO2: 27 mmol/L (ref 22–32)
Calcium: 9.3 mg/dL (ref 8.9–10.3)
Chloride: 106 mmol/L (ref 98–111)
Creatinine, Ser: 1.23 mg/dL (ref 0.61–1.24)
GFR calc Af Amer: >60 mL/min (ref >60)
GLUCOSE: 121 mg/dL — AB (ref 70–99)
POTASSIUM: 3.7 mmol/L (ref 3.5–5.1)
Sodium: 144 mmol/L (ref 135–145)

## 2018-04-13 SURGERY — BIOPSY, PROSTATE
Anesthesia: General

## 2018-04-13 MED ORDER — PROPOFOL 10 MG/ML IV BOLUS
INTRAVENOUS | Status: AC
  Filled 2018-04-13: qty 20

## 2018-04-13 MED ORDER — OXYCODONE HCL 5 MG PO TABS
5.0000 mg | ORAL_TABLET | ORAL | Status: DC | PRN
  Administered 2018-04-14 – 2018-04-19 (×10): 5 mg via ORAL
  Filled 2018-04-13 (×10): qty 1

## 2018-04-13 MED ORDER — LABETALOL HCL 5 MG/ML IV SOLN
INTRAVENOUS | Status: AC
  Filled 2018-04-13: qty 4

## 2018-04-13 MED ORDER — MIDAZOLAM HCL 2 MG/2ML IJ SOLN
INTRAMUSCULAR | Status: AC
  Filled 2018-04-13: qty 2

## 2018-04-13 MED ORDER — LORATADINE 10 MG PO TABS
10.0000 mg | ORAL_TABLET | Freq: Every day | ORAL | Status: DC
  Administered 2018-04-14 – 2018-04-19 (×6): 10 mg via ORAL
  Filled 2018-04-13 (×6): qty 1

## 2018-04-13 MED ORDER — METOPROLOL TARTRATE 5 MG/5ML IV SOLN
INTRAVENOUS | Status: DC | PRN
  Administered 2018-04-13 (×5): 1 mg via INTRAVENOUS

## 2018-04-13 MED ORDER — HYDRALAZINE HCL 20 MG/ML IJ SOLN
10.0000 mg | Freq: Once | INTRAMUSCULAR | Status: AC
  Administered 2018-04-13: 10 mg via INTRAVENOUS

## 2018-04-13 MED ORDER — HYDROXYZINE HCL 50 MG PO TABS
100.0000 mg | ORAL_TABLET | Freq: Every evening | ORAL | Status: DC | PRN
  Filled 2018-04-13 (×2): qty 2

## 2018-04-13 MED ORDER — PROPOFOL 10 MG/ML IV BOLUS
INTRAVENOUS | Status: AC
Start: 2018-04-13 — End: ?
  Filled 2018-04-13: qty 20

## 2018-04-13 MED ORDER — GENTAMICIN SULFATE 40 MG/ML IJ SOLN
5.0000 mg/kg | INTRAMUSCULAR | Status: AC
  Administered 2018-04-13: 420 mg via INTRAVENOUS
  Filled 2018-04-13: qty 10.5

## 2018-04-13 MED ORDER — FENTANYL CITRATE (PF) 100 MCG/2ML IJ SOLN
INTRAMUSCULAR | Status: AC
  Filled 2018-04-13: qty 2

## 2018-04-13 MED ORDER — CLONIDINE HCL 0.1 MG PO TABS
0.1000 mg | ORAL_TABLET | Freq: Once | ORAL | Status: AC
  Administered 2018-04-13: 0.1 mg via ORAL
  Filled 2018-04-13: qty 1

## 2018-04-13 MED ORDER — HYDROMORPHONE HCL 1 MG/ML IJ SOLN
INTRAMUSCULAR | Status: AC
  Filled 2018-04-13: qty 1

## 2018-04-13 MED ORDER — LACTATED RINGERS IV SOLN
INTRAVENOUS | Status: DC
  Administered 2018-04-13 – 2018-04-18 (×3): via INTRAVENOUS

## 2018-04-13 MED ORDER — EPHEDRINE SULFATE 50 MG/ML IJ SOLN
INTRAMUSCULAR | Status: DC | PRN
  Administered 2018-04-13: 5 mg via INTRAVENOUS

## 2018-04-13 MED ORDER — HYDRALAZINE HCL 20 MG/ML IJ SOLN
INTRAMUSCULAR | Status: AC
  Filled 2018-04-13: qty 1

## 2018-04-13 MED ORDER — SODIUM CHLORIDE 0.9 % IV SOLN
2.0000 g | INTRAVENOUS | Status: AC
  Administered 2018-04-13: 2 g via INTRAVENOUS
  Filled 2018-04-13: qty 20

## 2018-04-13 MED ORDER — SENNOSIDES-DOCUSATE SODIUM 8.6-50 MG PO TABS
1.0000 | ORAL_TABLET | Freq: Every evening | ORAL | Status: DC | PRN
  Administered 2018-04-13 – 2018-04-19 (×2): 1 via ORAL
  Filled 2018-04-13 (×2): qty 1

## 2018-04-13 MED ORDER — ACETAMINOPHEN 10 MG/ML IV SOLN
INTRAVENOUS | Status: DC | PRN
  Administered 2018-04-13: 1000 mg via INTRAVENOUS

## 2018-04-13 MED ORDER — ACETAMINOPHEN 10 MG/ML IV SOLN
INTRAVENOUS | Status: AC
  Filled 2018-04-13: qty 100

## 2018-04-13 MED ORDER — LIP MEDEX EX OINT
TOPICAL_OINTMENT | CUTANEOUS | Status: AC
  Filled 2018-04-13: qty 7

## 2018-04-13 MED ORDER — ONDANSETRON HCL 4 MG/2ML IJ SOLN
INTRAMUSCULAR | Status: DC | PRN
  Administered 2018-04-13: 4 mg via INTRAVENOUS

## 2018-04-13 MED ORDER — MIDAZOLAM HCL 5 MG/5ML IJ SOLN
INTRAMUSCULAR | Status: DC | PRN
  Administered 2018-04-13 (×4): 1 mg via INTRAVENOUS

## 2018-04-13 MED ORDER — ACETAMINOPHEN 325 MG PO TABS: 650.0000 mg | ORAL_TABLET | ORAL | Status: DC | PRN

## 2018-04-13 MED ORDER — BISACODYL 10 MG RE SUPP
10.0000 mg | Freq: Every day | RECTAL | Status: DC | PRN
  Filled 2018-04-13 (×2): qty 1

## 2018-04-13 MED ORDER — PHENYLEPHRINE 40 MCG/ML (10ML) SYRINGE FOR IV PUSH (FOR BLOOD PRESSURE SUPPORT)
PREFILLED_SYRINGE | INTRAVENOUS | Status: AC
  Filled 2018-04-13: qty 10

## 2018-04-13 MED ORDER — POTASSIUM CHLORIDE IN NACL 20-0.45 MEQ/L-% IV SOLN
INTRAVENOUS | Status: DC
  Administered 2018-04-13 – 2018-04-14 (×3): via INTRAVENOUS
  Filled 2018-04-13 (×6): qty 1000

## 2018-04-13 MED ORDER — FLUTICASONE PROPIONATE 50 MCG/ACT NA SUSP: 2.0000 | Freq: Every day | NASAL | Status: DC | PRN

## 2018-04-13 MED ORDER — HYOSCYAMINE SULFATE 0.125 MG SL SUBL
0.1250 mg | SUBLINGUAL_TABLET | SUBLINGUAL | Status: DC | PRN
  Administered 2018-04-13 – 2018-04-18 (×4): 0.125 mg via SUBLINGUAL
  Filled 2018-04-13 (×7): qty 1

## 2018-04-13 MED ORDER — LIDOCAINE HCL (CARDIAC) PF 100 MG/5ML IV SOSY
PREFILLED_SYRINGE | INTRAVENOUS | Status: DC | PRN
  Administered 2018-04-13 (×2): 50 mg via INTRAVENOUS

## 2018-04-13 MED ORDER — FLEET ENEMA 7-19 GM/118ML RE ENEM: 1.0000 | ENEMA | Freq: Once | RECTAL | Status: DC | PRN

## 2018-04-13 MED ORDER — SPIRONOLACTONE 25 MG PO TABS
25.0000 mg | ORAL_TABLET | Freq: Every day | ORAL | Status: DC
  Administered 2018-04-14 – 2018-04-19 (×6): 25 mg via ORAL
  Filled 2018-04-13 (×6): qty 1

## 2018-04-13 MED ORDER — HYDROCODONE-ACETAMINOPHEN 5-325 MG PO TABS: 1.0000 | ORAL_TABLET | Freq: Four times a day (QID) | ORAL | 0 refills | Status: DC | PRN

## 2018-04-13 MED ORDER — FENTANYL CITRATE (PF) 100 MCG/2ML IJ SOLN
25.0000 ug | INTRAMUSCULAR | Status: DC | PRN
  Administered 2018-04-13 (×2): 50 ug via INTRAVENOUS

## 2018-04-13 MED ORDER — PROPOFOL 10 MG/ML IV BOLUS
INTRAVENOUS | Status: DC | PRN
  Administered 2018-04-13: 50 mg via INTRAVENOUS
  Administered 2018-04-13: 150 mg via INTRAVENOUS
  Administered 2018-04-13: 50 mg via INTRAVENOUS

## 2018-04-13 MED ORDER — LABETALOL HCL 5 MG/ML IV SOLN
10.0000 mg | INTRAVENOUS | Status: AC | PRN
Start: 2018-04-13 — End: 2018-04-13
  Administered 2018-04-13 (×2): 10 mg via INTRAVENOUS

## 2018-04-13 MED ORDER — HYDROMORPHONE HCL 1 MG/ML IJ SOLN
0.2500 mg | INTRAMUSCULAR | Status: DC | PRN
  Administered 2018-04-13: 0.5 mg via INTRAVENOUS
  Administered 2018-04-13 (×2): 0.25 mg via INTRAVENOUS
  Administered 2018-04-13 (×2): 0.5 mg via INTRAVENOUS

## 2018-04-13 MED ORDER — FENTANYL CITRATE (PF) 100 MCG/2ML IJ SOLN
INTRAMUSCULAR | Status: DC | PRN
  Administered 2018-04-13 (×4): 50 ug via INTRAVENOUS

## 2018-04-13 MED ORDER — PHENYLEPHRINE HCL 10 MG/ML IJ SOLN
INTRAMUSCULAR | Status: DC | PRN
  Administered 2018-04-13: 80 ug via INTRAVENOUS

## 2018-04-13 MED ORDER — EPHEDRINE 5 MG/ML INJ
INTRAVENOUS | Status: AC
  Filled 2018-04-13: qty 10

## 2018-04-13 MED ORDER — ONDANSETRON HCL 4 MG/2ML IJ SOLN: 4.0000 mg | INTRAMUSCULAR | Status: DC | PRN

## 2018-04-13 MED ORDER — METOPROLOL TARTRATE 50 MG PO TABS
50.0000 mg | ORAL_TABLET | Freq: Two times a day (BID) | ORAL | Status: DC
  Administered 2018-04-13 – 2018-04-19 (×12): 50 mg via ORAL
  Filled 2018-04-13 (×12): qty 1

## 2018-04-13 MED ORDER — ALPRAZOLAM 0.5 MG PO TABS
0.5000 mg | ORAL_TABLET | Freq: Two times a day (BID) | ORAL | Status: DC | PRN
  Administered 2018-04-13 – 2018-04-16 (×5): 0.5 mg via ORAL
  Filled 2018-04-13 (×5): qty 1

## 2018-04-13 MED ORDER — BELLADONNA ALKALOIDS-OPIUM 16.2-60 MG RE SUPP
1.0000 | Freq: Three times a day (TID) | RECTAL | Status: DC | PRN
  Administered 2018-04-13: 1 via RECTAL
  Filled 2018-04-13: qty 1

## 2018-04-13 MED ORDER — ALBUTEROL SULFATE HFA 108 (90 BASE) MCG/ACT IN AERS
INHALATION_SPRAY | RESPIRATORY_TRACT | Status: DC | PRN
  Administered 2018-04-13: 5 via RESPIRATORY_TRACT

## 2018-04-13 MED ORDER — SODIUM CHLORIDE 0.9 % IR SOLN
Status: DC | PRN
  Administered 2018-04-13: 6000 mL

## 2018-04-13 MED ORDER — DEXAMETHASONE SODIUM PHOSPHATE 10 MG/ML IJ SOLN
INTRAMUSCULAR | Status: DC | PRN
  Administered 2018-04-13: 10 mg via INTRAVENOUS

## 2018-04-13 MED ORDER — HYDROMORPHONE HCL 1 MG/ML IJ SOLN
0.5000 mg | INTRAMUSCULAR | Status: DC | PRN
  Filled 2018-04-13: qty 1

## 2018-04-13 SURGICAL SUPPLY — 17 items
BAG URO CATCHER STRL LF (MISCELLANEOUS) ×4 IMPLANT
CATH FOLEY 2WAY SLVR 30CC 22FR (CATHETERS) ×1 IMPLANT
CATH URET 5FR 28IN OPEN ENDED (CATHETERS) IMPLANT
CLOTH BEACON ORANGE TIMEOUT ST (SAFETY) ×4 IMPLANT
COVER SURGICAL LIGHT HANDLE (MISCELLANEOUS) ×4 IMPLANT
GLOVE SURG SS PI 8.0 STRL IVOR (GLOVE) IMPLANT
GOWN STRL REUS W/TWL XL LVL3 (GOWN DISPOSABLE) ×4 IMPLANT
GUIDEWIRE ANG ZIPWIRE 038X150 (WIRE) ×1 IMPLANT
GUIDEWIRE STR DUAL SENSOR (WIRE) ×5 IMPLANT
INST BIOPSY MAXCORE 18GX25 (NEEDLE) ×1 IMPLANT
MANIFOLD NEPTUNE II (INSTRUMENTS) ×4 IMPLANT
PACK CYSTO (CUSTOM PROCEDURE TRAY) ×4 IMPLANT
STENT URET 6FRX26 CONTOUR (STENTS) ×2 IMPLANT
SYR CONTROL 10ML LL (SYRINGE) ×4 IMPLANT
SYRINGE IRR TOOMEY STRL 70CC (SYRINGE) ×1 IMPLANT
TUBING CONNECTING 10 (TUBING) ×4 IMPLANT
UNDERPAD 30X30 (UNDERPADS AND DIAPERS) ×4 IMPLANT

## 2018-04-13 NOTE — Anesthesia Preprocedure Evaluation (Addendum)
Anesthesia Evaluation  Patient identified by MRN, date of birth, ID band Patient awake    Reviewed: Allergy & Precautions, NPO status , Patient's Chart, lab work & pertinent test results  Airway Mallampati: II  TM Distance: >3 FB     Dental   Pulmonary neg pulmonary ROS,    breath sounds clear to auscultation       Cardiovascular hypertension, + Past MI   Rhythm:Regular Rate:Normal     Neuro/Psych    GI/Hepatic negative GI ROS, Neg liver ROS,   Endo/Other  negative endocrine ROS  Renal/GU negative Renal ROS     Musculoskeletal   Abdominal   Peds  Hematology   Anesthesia Other Findings   Reproductive/Obstetrics                             Anesthesia Physical Anesthesia Plan  ASA: III  Anesthesia Plan: General   Post-op Pain Management:    Induction: Intravenous  PONV Risk Score and Plan: 2 and Ondansetron, Dexamethasone and Midazolam  Airway Management Planned: LMA  Additional Equipment:   Intra-op Plan:   Post-operative Plan: Extubation in OR  Informed Consent: I have reviewed the patients History and Physical, chart, labs and discussed the procedure including the risks, benefits and alternatives for the proposed anesthesia with the patient or authorized representative who has indicated his/her understanding and acceptance.   Dental advisory given  Plan Discussed with: CRNA and Anesthesiologist  Anesthesia Plan Comments:        Anesthesia Quick Evaluation

## 2018-04-13 NOTE — Transfer of Care (Signed)
Immediate Anesthesia Transfer of Care Note  Patient: Dylan Gonzalez  Procedure(s) Performed: ULTRASOUND GUIDED PROSTATE BIOPSY ([name] ) CYSTOSCOPY WITH BILATERAL STENT REPLACEMENT (Bilateral ) TRANSURETHRAL RESECTION OF THE PROSTATE (TURP)  Patient Location: PACU  Anesthesia Type:General  Level of Consciousness: awake, oriented, pateint uncooperative, confused and responds to stimulation  Airway & Oxygen Therapy: Patient Spontanous Breathing and Patient connected to face mask oxygen  Post-op Assessment: Report given to RN, Post -op Vital signs reviewed and stable and Patient moving all extremities  Post vital signs: Reviewed and stable  Last Vitals:  Vitals Value Taken Time  BP 162/112 04/13/2018  2:03 PM  Temp    Pulse 75 04/13/2018  2:11 PM  Resp 15 04/13/2018  2:11 PM  SpO2 97 % 04/13/2018  2:11 PM  Vitals shown include unvalidated device data.  Last Pain:  Vitals:   04/13/18 0936  TempSrc: Oral         Complications: No apparent anesthesia complications

## 2018-04-13 NOTE — Anesthesia Procedure Notes (Signed)
Procedure Name: LMA Insertion Date/Time: 04/13/2018 12:20 PM Performed by: Freddrick March, MD Pre-anesthesia Checklist: Patient identified, Emergency Drugs available, Suction available, Patient being monitored and Timeout performed Patient Re-evaluated:Patient Re-evaluated prior to induction Oxygen Delivery Method: Circle system utilized Preoxygenation: Pre-oxygenation with 100% oxygen Induction Type: IV induction Ventilation: Mask ventilation without difficulty LMA: LMA with gastric port inserted LMA Size: 4.0 Number of attempts: 1 Placement Confirmation: positive ETCO2 and breath sounds checked- equal and bilateral Tube secured with: Tape Dental Injury: Teeth and Oropharynx as per pre-operative assessment

## 2018-04-13 NOTE — Discharge Instructions (Signed)
Ureteral Stent Implantation, Care After Refer to this sheet in the next few weeks. These instructions provide you with information about caring for yourself after your procedure. Your health care provider may also give you more specific instructions. Your treatment has been planned according to current medical practices, but problems sometimes occur. Call your health care provider if you have any problems or questions after your procedure. What can I expect after the procedure? After the procedure, it is common to have:  Nausea.  Mild pain when you urinate. You may feel this pain in your lower back or lower abdomen. Pain should stop within a few minutes after you urinate. This may last for up to 1 week.  A small amount of blood in your urine for several days.  Follow these instructions at home:  Medicines  Take over-the-counter and prescription medicines only as told by your health care provider.  If you were prescribed an antibiotic medicine, take it as told by your health care provider. Do not stop taking the antibiotic even if you start to feel better.  Do not drive for 24 hours if you received a sedative.  Do not drive or operate heavy machinery while taking prescription pain medicines. Activity  Return to your normal activities as told by your health care provider. Ask your health care provider what activities are safe for you.  Do not lift anything that is heavier than 10 lb (4.5 kg). Follow this limit for 1 week after your procedure, or for as long as told by your health care provider. General instructions  Watch for any blood in your urine. Call your health care provider if the amount of blood in your urine increases.  If you have a catheter: ? Follow instructions from your health care provider about taking care of your catheter and collection bag. ? Do not take baths, swim, or use a hot tub until your health care provider approves.  Drink enough fluid to keep your urine  clear or pale yellow.  Keep all follow-up visits as told by your health care provider. This is important. Contact a health care provider if:  You have pain that gets worse or does not get better with medicine, especially pain when you urinate.  You have difficulty urinating.  You feel nauseous or you vomit repeatedly during a period of more than 2 days after the procedure. Get help right away if:  Your urine is dark red or has blood clots in it.  You are leaking urine (have incontinence).  The end of the stent comes out of your urethra.  You cannot urinate.  You have sudden, sharp, or severe pain in your abdomen or lower back.  You have a fever. This information is not intended to replace advice given to you by your health care provider. Make sure you discuss any questions you have with your health care provider. Document Released: 04/25/2013 Document Revised: 01/29/2016 Document Reviewed: 03/07/2015 Elsevier Interactive Patient Education  2018 [employer]. Transurethral Resection of the Prostate, Care After Refer to this sheet in the next few weeks. These instructions provide you with information about caring for yourself after your procedure. Your health care provider may also give you more specific instructions. Your treatment has been planned according to current medical practices, but problems sometimes occur. Call your health care provider if you have any problems or questions after your procedure. What can I expect after the procedure? After the procedure, it is common to have:  Mild pain in  your lower abdomen.  Soreness or mild discomfort in your penis from having the catheter inserted during the procedure.  A feeling of urgency when you need to urinate.  A small amount of blood in your urine. You may notice some small blood clots in your urine. These are normal.  Follow these instructions at home: Medicines   Take over-the-counter and prescription medicines only  as told by your health care provider.  Do not drive or operate heavy machinery while taking prescription pain medicine.  Do not drive for 24 hours if you received a sedative.  If you were prescribed antibiotic medicine, take it as told by your health care provider. Do not stop taking the antibiotic even if you start to feel better. Activity  Return to your normal activities as told by your health care provider. Ask your health care provider what activities are safe for you.  Do not lift anything that is heavier than 10 lb (4.5 kg) for 3 weeks after your procedure, or as long as told by your health care provider.  Avoid intense physical activity for as long as told by your health care provider.  Walk at least one time every day. This helps to prevent blood clots. You may increase your physical activity gradually as you start to feel better. Lifestyle  Do not drink alcohol for as long as told by your health care provider. This is especially important if you are taking prescription pain medicines.  Do not engage in sexual activity until your health care provider says that you can do this. General instructions  Do not take baths, swim, or use a hot tub until your health care provider approves.  Drink enough fluid to keep your urine clear or pale yellow.  Urinate as soon as you feel the need to. Do not try to hold your urine for long periods of time.  If your health care provider approves, you may take a stool softener for 2-3 weeks to prevent you from straining to have a bowel movement.  Wear compression stockings as told by your health care provider. These stockings help to prevent blood clots and reduce swelling in your legs.  Keep all follow-up visits as told by your health care provider. This is important. Contact a health care provider if:  You have difficulty urinating.  You have a fever.  You have pain that gets worse or does not improve with medicine.  You have blood in  your urine that does not go away after 1 week of resting and drinking more fluids.  You have swelling in your penis or testicles. Get help right away if:  You are unable to urinate.  You are having more blood clots in your urine instead of fewer.  You have: ? Large blood clots. ? A lot of blood in your urine. ? Pain in your back or lower abdomen. ? Pain or swelling in your legs. ? Chills and you are shaking. This information is not intended to replace advice given to you by your health care provider. Make sure you discuss any questions you have with your health care provider. Document Released: 08/23/2005 Document Revised: 04/25/2016 Document Reviewed: 05/15/2015 Elsevier Interactive Patient Education  2017 [employer].

## 2018-04-13 NOTE — Anesthesia Postprocedure Evaluation (Signed)
Anesthesia Post Note  Patient: Dylan Gonzalez  Procedure(s) Performed: ULTRASOUND GUIDED PROSTATE BIOPSY ([name] ) CYSTOSCOPY WITH BILATERAL STENT REPLACEMENT (Bilateral ) TRANSURETHRAL RESECTION OF THE PROSTATE (TURP)     Patient location during evaluation: PACU Anesthesia Type: General Level of consciousness: awake Pain management: pain level controlled Vital Signs Assessment: post-procedure vital signs reviewed and stable Respiratory status: spontaneous breathing Cardiovascular status: stable Postop Assessment: no headache Anesthetic complications: no    Last Vitals:  Vitals:   04/13/18 0936  BP: (!) 146/100  Pulse: 64  Resp: 18  Temp: 36.7 C  SpO2: 100%    Last Pain:  Vitals:   04/13/18 0936  TempSrc: Oral                 [name]

## 2018-04-13 NOTE — Op Note (Signed)
Procedure: 1.  Transrectal prostate ultrasound. 2.  Transrectal ultrasound-guided prostate biopsy. 3.  Cystoscopy with bilateral retrograde pyelograms and interpretation. 4.  Cystoscopy with insertion of bilateral double-J ureteral stents. 5.  Transurethral resection of prostatic urethral tumor.  Preop diagnosis: Pelvic mass with bilateral ureteral obstruction.  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  Surgeon: Dr. Irine Seal.  Anesthesia: General.  Specimen: Transrectal prostate biopsy x4, prostatic urethral tumor chips.  Drains: Bilateral double-J ureteral stents and 22 French Foley catheter.  EBL: Minimal.  Complications: None.  Indications: The patient is a 52 year old African-American male who presented with a 6-week history of intermittent gross hematuria and progressive voiding difficulty.  A CT scan in the office demonstrated a large pelvic mass that appeared to originate either from the prostate or posterior to the prostate with elevation of the trigone and bilateral ureteral obstruction with mild hydronephrosis.  It was felt that transrectal ultrasound-guided biopsy and cystoscopy with possible biopsy and stent insertion was indicated.  Procedure: He was given Rocephin and gentamicin.  A general anesthetic was induced and he was placed in the lithotomy position and fitted with PAS hose.  The transrectal ultrasound probe was assembled and inserted and scanning was performed.  Inspection revealed the large pelvic mass that appeared to originate from the left side of the prostate.  The mass appeared solid and did not appear to have a liquid/necrotic center as suggested potentially by the CT scan.  The seminal vesicles were not identified.  Measurements were not performed since they were well documented on the CT scan with a maximum diameter of 11cm and a volume in excess of 400 mL.  After completion of the diagnostic scan  for transrectal ultrasound-guided needle biopsies were obtained with 2 from each side of the mass and prostate.  The patient was then prepped with Betadine solution and draped in usual sterile fashion.  Cystoscopy was performed using the 30 and 70 degree lenses.  Examination demonstrated a normal urethra.  The external sphincter was intact.  Within the prostatic urethra, there were papillary fronds arising from the left mid and apical prostatic urethra consistent with neoplasm with obstruction of the prostatic urethra.  There was some lateral lobe enlargement on the right with more benign appearance.  The bladder neck and trigone were elevated.  Ureteral orifices were identified but were somewhat attenuated.  There was clear E flux from the right ureteral orifice but no reflux from the left.  The bladder wall had mild trabeculation with no tumor stones or inflammation noted.  The right ureteral orifice was cannulated with a 5 French opening catheter and contrast was instilled.    The right retrograde pyelogram demonstrated narrowing of the distal ureter with minimal dilation proximally.  Despite the minimal dilation it was felt that insertion of the stent was indicated.  An angled tip Glidewire was passed through the opening catheter and advanced to the kidney.  The opening catheter was advanced over the wire which was then exchanged for a sensor wire.  A 6 French by 26 centimeter contour double-J stent was then advanced over the wire to the kidney.  The wire was removed leaving good coil in the kidney and a good coil in the bladder.  The left ureteral orifice was not initially identified using a 30 degree lens, but was visualized using a 70 degree lens.  With the aid of an Alba Rands bridge I was able to advance a 5 [place] opening catheter  into the orifice to perform a retrograde pyelogram.  The left retrograde pyelogram revealed more severe attenuation of the distal ureter from extrinsic compression  with some proximal dilation.  A Glidewire was then passed to the kidney through the opening catheter and the opening catheter was removed.  A second 6 [place] by 26 cm contour double-J stent was advanced to the kidney over the Glidewire without difficulty.  Wire was removed leaving a good coil in the kidney and a good coil in the bladder.  The cystoscope was then removed and the 26 French continuous-flow resectoscope sheath was inserted with the aid of a visual obturator.  The papillary tumor was then resected from the prostatic urethra using the [facility] handle with a bipolar loop and 30 degree lens.  Once the tumor been resected back to the floor the prostate the tissue was retrieved and hemostasis was achieved.  The scope was removed and a 82 [place] Foley catheter was inserted with the aid of a catheter guide without difficulty.  The balloon was filled with 30 mL of fluid and then the catheter was irrigated with clear return in place to straight drainage.  He was then taken down from lithotomy position, his anesthetic was reversed and he was moved to recovery in stable condition.  There were no complications.

## 2018-04-14 ENCOUNTER — Encounter (HOSPITAL_COMMUNITY): Payer: Self-pay | Admitting: Urology

## 2018-04-14 DIAGNOSIS — I129 Hypertensive chronic kidney disease with stage 1 through stage 4 chronic kidney disease, or unspecified chronic kidney disease: Secondary | ICD-10-CM | POA: Diagnosis not present

## 2018-04-14 DIAGNOSIS — K5900 Constipation, unspecified: Secondary | ICD-10-CM | POA: Diagnosis not present

## 2018-04-14 DIAGNOSIS — C61 Malignant neoplasm of prostate: Secondary | ICD-10-CM | POA: Diagnosis not present

## 2018-04-14 DIAGNOSIS — N131 Hydronephrosis with ureteral stricture, not elsewhere classified: Secondary | ICD-10-CM | POA: Diagnosis not present

## 2018-04-14 DIAGNOSIS — I252 Old myocardial infarction: Secondary | ICD-10-CM | POA: Diagnosis not present

## 2018-04-14 DIAGNOSIS — R310 Gross hematuria: Secondary | ICD-10-CM | POA: Diagnosis not present

## 2018-04-14 DIAGNOSIS — N179 Acute kidney failure, unspecified: Secondary | ICD-10-CM | POA: Diagnosis not present

## 2018-04-14 DIAGNOSIS — C494 Malignant neoplasm of connective and soft tissue of abdomen: Secondary | ICD-10-CM | POA: Diagnosis not present

## 2018-04-14 DIAGNOSIS — N183 Chronic kidney disease, stage 3 (moderate): Secondary | ICD-10-CM | POA: Diagnosis not present

## 2018-04-14 DIAGNOSIS — N3289 Other specified disorders of bladder: Secondary | ICD-10-CM | POA: Diagnosis not present

## 2018-04-14 DIAGNOSIS — Z79899 Other long term (current) drug therapy: Secondary | ICD-10-CM | POA: Diagnosis not present

## 2018-04-14 LAB — COMPREHENSIVE METABOLIC PANEL
ALK PHOS: 42 U/L (ref 38–126)
ALT: 20 U/L (ref 0–44)
AST: 28 U/L (ref 15–41)
Albumin: 3.6 g/dL (ref 3.5–5.0)
Anion gap: 11 (ref 5–15)
BUN: 18 mg/dL (ref 6–20)
CALCIUM: 8.5 mg/dL — AB (ref 8.9–10.3)
CO2: 24 mmol/L (ref 22–32)
CREATININE: 1.33 mg/dL — AB (ref 0.61–1.24)
Chloride: 102 mmol/L (ref 98–111)
GFR, EST NON AFRICAN AMERICAN: 60 mL/min — AB (ref >60)
Glucose, Bld: 150 mg/dL — ABNORMAL HIGH (ref 70–99)
Potassium: 4.1 mmol/L (ref 3.5–5.1)
Sodium: 137 mmol/L (ref 135–145)
Total Bilirubin: 0.5 mg/dL (ref 0.3–1.2)
Total Protein: 6.7 g/dL (ref 6.5–8.1)

## 2018-04-14 LAB — HIV ANTIBODY (ROUTINE TESTING W REFLEX): HIV Screen 4th Generation wRfx: NONREACTIVE

## 2018-04-14 MED ORDER — LORAZEPAM BOLUS VIA INFUSION: 1.0000 mg | INTRAVENOUS | Status: DC | PRN

## 2018-04-14 MED ORDER — OXYBUTYNIN CHLORIDE 5 MG PO TABS: 5.0000 mg | ORAL_TABLET | Freq: Three times a day (TID) | ORAL | 3 refills | Status: DC

## 2018-04-14 MED ORDER — OXYBUTYNIN CHLORIDE 5 MG PO TABS
5.0000 mg | ORAL_TABLET | Freq: Three times a day (TID) | ORAL | Status: DC
  Administered 2018-04-14 – 2018-04-16 (×9): 5 mg via ORAL
  Filled 2018-04-14 (×9): qty 1

## 2018-04-14 MED ORDER — LORAZEPAM 2 MG/ML IJ SOLN
1.0000 mg | Freq: Once | INTRAMUSCULAR | Status: AC | PRN
  Administered 2018-04-15: 1 mg via INTRAVENOUS
  Filled 2018-04-14: qty 1

## 2018-04-14 NOTE — H&P (Deleted)
CC: I have blood in my urine.  HPI: Dylan Gonzalez is a 52 year-old male patient who was referred by Donald Prose who is here for blood in the urine.  He did see the blood in his urine. He first noticed the symptoms 02/27/2018. He has seen blood clots.   He does have a burning sensation when he urinates. He is currently having trouble urinating.   He is not having pain. He has not recently had unwanted weight loss.   Dylan Gonzalez is a 52 yo BM who had the onset on 6/24 for painless gross hematuria and went to the ER. No imaging was done. A culture was negative. His Cr was 1.2. He has had another episode and passed a clot about 3 days ago. He has had increased voiding symptoms and he is having some dysuira and he has severe LUTS with an IPSS of 31 since last week but he was voiding ok prior. He has microhematuria today. He has had no prior UTI's, stones or GU surgery.      CC: AUA Questions Scoring.  HPI:     AUA Symptom Score: Almost always he has the sensation of not emptying his bladder completely when finished urinating. Almost always he has to urinate again fewer than two hours after he has finished urinating. More than 50% of the time he has to start and stop again several times when he urinates. Almost always he finds it difficult to postpone urination. More than 50% of the time he has a weak urinary stream. More than 50% of the time he has to push or strain to begin urination. He has to get up to urinate 4 times from the time he goes to bed until the time he gets up in the morning.   Calculated AUA Symptom Score: 31    ALLERGIES: None   MEDICATIONS: Hydrochlorothiazide 25 mg tablet  Metoprolol Succinate 50 mg tablet, extended release 24 hr  Zyrtec  Benadryl  Spironolactone 25 mg tablet     GU PSH: None   NON-GU PSH: Laparoscopy, Surgical; Repair Umbilical Hernia - about 2016    GU PMH: None     PMH Notes: retinoblastoma left eye 1976   NON-GU PMH: Hypertension Myocardial  Infarction    FAMILY HISTORY: Gout - Father   SOCIAL HISTORY: Marital Status: Married Preferred Language: English; Race: Black or African American Current Smoking Status: Patient has never smoked.   Tobacco Use Assessment Completed: Used Tobacco in last 30 days? Has never drank.  Drinks 3 caffeinated drinks per day. Patient's occupation Geneticist, molecular.     Notes: 2 stepdaughters, 1 stepson   REVIEW OF SYSTEMS:    GU Review Male:   Patient reports frequent urination, hard to postpone urination, burning/ pain with urination, get up at night to urinate, stream starts and stops, trouble starting your stream, have to strain to urinate , and penile pain. Patient denies leakage of urine and erection problems.  Gastrointestinal (Upper):   Patient denies nausea, vomiting, and indigestion/ heartburn.  Gastrointestinal (Lower):   Patient denies diarrhea and constipation.  Constitutional:   Patient reports night sweats. Patient denies fever, weight loss, and fatigue.  Skin:   Patient denies skin rash/ lesion and itching.  Eyes:   Patient denies blurred vision and double vision.  Ears/ Nose/ Throat:   Patient reports sinus problems. Patient denies sore throat.  Hematologic/Lymphatic:   Patient denies swollen glands and easy bruising.  Cardiovascular:   Patient denies leg swelling and  chest pains.  Respiratory:   Patient denies cough and shortness of breath.  Endocrine:   Patient reports excessive thirst.   Musculoskeletal:   Patient denies back pain and joint pain.  Neurological:   Patient denies headaches and dizziness.  Psychologic:   Patient denies depression and anxiety.   VITAL SIGNS:      04/12/2018 11:13 AM  Weight 219 lb / 99.34 kg  Height 71 in / 180.34 cm  BP 142/92 mmHg  Pulse 74 /min  Temperature 98.4 F / 36.8 C  BMI 30.5 kg/m   GU PHYSICAL EXAMINATION:    Anus and Perineum: No hemorrhoids. No anal stenosis. No rectal fissure, no anal fissure. No edema, no dimple, no  perineal tenderness, no anal tenderness.  Scrotum: No lesions. No edema. No cysts. No warts.  Epididymides: Right: no spermatocele, no masses, no cysts, no tenderness, no induration, no enlargement. Left: no spermatocele, no masses, no cysts, no tenderness, no induration, no enlargement.  Testes: Atrophic left testis. Atrophic right testis. No tenderness, no swelling, no enlargement left testis. No tenderness, no swelling, no enlargement right testis. Normal location left testis. Normal location right testis. No mass, no cyst, no varicocele, no hydrocele left testis. No mass, no cyst, no varicocele, no hydrocele right testis.   Urethral Meatus: Normal size. No lesion, no wart, no discharge, no polyp. Normal location.  Penis: Circumcised, no warts, no cracks. No dorsal Peyronie's plaques, no left corporal Peyronie's plaques, no right corporal Peyronie's plaques, no scarring, no warts. No balanitis, no meatal stenosis.  Prostate: It is difficult to clearly identify the prostate, it could be large or he could have a full bladder that is giving that effect.   Seminal Vesicles: Nonpalpable.  Sphincter Tone: Normal sphincter. No rectal tenderness. No rectal mass.    MULTI-SYSTEM PHYSICAL EXAMINATION:    Constitutional: Well-nourished. No physical deformities. Normally developed. Good grooming.  Neck: Neck symmetrical, not swollen. Normal tracheal position.  Respiratory: No labored breathing, no use of accessory muscles. CTA  Cardiovascular: Normal temperature, RRR without murmur  Lymphatic: No enlargement of neck, axillae, groin.  Skin: No paleness, no jaundice, no cyanosis. No lesion, no ulcer, no rash.  Neurologic / Psychiatric: Oriented to time, oriented to place, oriented to person. No depression, no anxiety, no agitation.  Gastrointestinal: Obese abdomen. Umbilical hernia scar. No mass, no tenderness, no rigidity.   Eyes: prosthetic left eye  Musculoskeletal: Normal gait and station of head and  neck.     PAST DATA REVIEWED:  Source Of History:  Patient  Lab Test Review:   BUN/Creatinine, BMP, CBC with Diff  Records Review:   AUA Symptom Score, Previous Hospital Records  Urine Test Review:   Urinalysis  X-Ray Review: C.T. Hematuria: Reviewed Films. Discussed With Patient.    Notes:                     His labs and notes from the ER on 6/24 were reviewed. he had >50 RBC's. His Cr was 1.2 and his Hgb was normal.    PROCEDURES:         C.T. Hematuria - 74178, [phone]  CT shows a large pelvic mass 9-10cm in diameter posterior to the prostate and base of bladder with bilateral ureteral obstruction. There is central necrosis. Full report is pending.       OMNIPAQUE 300 125CC        PVR Ultrasound - 85277  Scanned Volume: 362 cc  Urinalysis w/Scope - 81001 Dipstick Dipstick Cont'd Micro  Color: Yellow Bilirubin: Neg mg/dL WBC/hpf: NS (Not Seen)  Appearance: Clear Ketones: Neg mg/dL RBC/hpf: 3 - 10/hpf  Specific Gravity: 1.020 Blood: 2+ ery/uL Bacteria: NS (Not Seen)  pH: <=5.0 Protein: Neg mg/dL Cystals: NS (Not Seen)  Glucose: Neg mg/dL Urobilinogen: 0.2 mg/dL Casts: NS (Not Seen)    Nitrites: Neg Trichomonas: Not Present    Leukocyte Esterase: Neg leu/uL Mucous: Not Present      Epithelial Cells: NS (Not Seen)      Yeast: NS (Not Seen)      Sperm: Not Present    ASSESSMENT:      ICD-10 Details  1 GU:   Gross hematuria - R31.0 He has gross hematuria that appears to be secondary to a large pelvic mass that is posterior to the prostate. he has some degree of ureteral obstruction from the mass. I am going to get him set up to go to the OR for cystoscopy with possible ureteral stent insertion. I will attempt to biopsy the lesion and will try both a transrectal biopsy and possible a TUR biopsy. I have reviewed the risks of the procedure in detail.   2   Urinary Urgency - R39.15 He has outlet obstruction from the mass.   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    PLAN:           Orders Labs BUN/Creatinine(Stat), PSA  X-Rays: C.T. Hematuria With and Without I.V. Contrast  X-Ray Notes: History:  Hematuria: Yes/No  Patient to see MD after exam: Yes/No  Previous exam: CT / IVP/ US/ KUB/ None  When:  Where:  Diabetic: Yes/ No  BUN/ Creatinine:  Date of last BUN Creatinine:  Weight in pounds:  Allergy- IV Contrast: Yes/ No  Conflicting diabetic meds: Yes/ No  Diabetic Meds:  Prior Authorization #: [id number]           Schedule Return Visit/Planned Activity: ASAP - Schedule Surgery             Note: Tomorrow if possible.           Document Letter(s):  Created for Patient: Clinical Summary         Notes:   CC: Dr. Donald Prose

## 2018-04-14 NOTE — Progress Notes (Signed)
1 Day Post-Op  Subjective: CC: Bladder spasm.  Hx: Post op day 1 from TRUS and TUR biopsy of pelvic/prostatic mass with placement of bilateral ureteral stents for ureteral obstruction from the mass.  His primary complaint is frequency, urgency and bladder spasms.  His urine is light pink.  His PSA from the office was only 0.61.  Cr today is 1.33. ROS:  Review of Systems  Constitutional: Negative for chills and fever.  Gastrointestinal: Negative for nausea and vomiting.  Genitourinary: Positive for frequency and urgency.    Anti-infectives: Anti-infectives (From admission, onward)   Start     Dose/Rate Route Frequency Ordered Stop   04/13/18 1015  gentamicin (GARAMYCIN) 420 mg in dextrose 5 % 100 mL IVPB     5 mg/kg  84.2 kg (Adjusted) 221 mL/hr over 30 Minutes Intravenous 30 min pre-op 04/13/18 0913 04/13/18 1315   04/13/18 0913  cefTRIAXone (ROCEPHIN) 2 g in sodium chloride 0.9 % 100 mL IVPB     2 g 200 mL/hr over 30 Minutes Intravenous 30 min pre-op 04/13/18 0913 04/13/18 1242      Current Facility-Administered Medications  Medication Dose Route Frequency Provider Last Rate Last Dose  . 0.45 % NaCl with KCl 20 mEq / L infusion   Intravenous Continuous Irine Seal, MD 100 mL/hr at 04/14/18 0247    . acetaminophen (TYLENOL) tablet 650 mg  650 mg Oral Q4H PRN Irine Seal, MD      . ALPRAZolam Duanne Moron) tablet 0.5 mg  0.5 mg Oral BID PRN Kathie Rhodes, MD   0.5 mg at 04/13/18 1939  . bisacodyl (DULCOLAX) suppository 10 mg  10 mg Rectal Daily PRN Irine Seal, MD      . fluticasone (FLONASE) 50 MCG/ACT nasal spray 2 spray  2 spray Each Nare Daily PRN Irine Seal, MD      . HYDROmorphone (DILAUDID) injection 0.5-1 mg  0.5-1 mg Intravenous Q2H PRN Irine Seal, MD      . hydrOXYzine (ATARAX/VISTARIL) tablet 100 mg  100 mg Oral QHS PRN Irine Seal, MD      . hyoscyamine (LEVSIN SL) SL tablet 0.125 mg  0.125 mg Sublingual Q4H PRN Irine Seal, MD   0.125 mg at 04/14/18 0441  . lactated  ringers infusion   Intravenous Continuous Lillia Abed, MD 50 mL/hr at 04/13/18 0940    . loratadine (CLARITIN) tablet 10 mg  10 mg Oral Daily Irine Seal, MD      . metoprolol tartrate (LOPRESSOR) tablet 50 mg  50 mg Oral BID Irine Seal, MD   50 mg at 04/13/18 2141  . ondansetron (ZOFRAN) injection 4 mg  4 mg Intravenous Q4H PRN Irine Seal, MD      . opium-belladonna (B&O SUPPRETTES) 16.2-60 MG suppository 1 suppository  1 suppository Rectal Q8H PRN Irine Seal, MD   1 suppository at 04/13/18 2350  . oxybutynin (DITROPAN) tablet 5 mg  5 mg Oral TID Irine Seal, MD      . oxyCODONE (Oxy IR/ROXICODONE) immediate release tablet 5 mg  5 mg Oral Q4H PRN Irine Seal, MD   5 mg at 04/14/18 0207  . senna-docusate (Senokot-S) tablet 1 tablet  1 tablet Oral QHS PRN Irine Seal, MD   1 tablet at 04/13/18 1738  . sodium phosphate (FLEET) 7-19 GM/118ML enema 1 enema  1 enema Rectal Once PRN Irine Seal, MD      . spironolactone (ALDACTONE) tablet 25 mg  25 mg Oral Daily Irine Seal, MD  Objective: Vital signs in last 24 hours: Temp:  [97.6 F (36.4 C)-99.1 F (37.3 C)] 99.1 F (37.3 C) (08/09 0413) Pulse Rate:  [64-96] 92 (08/09 0413) Resp:  [6-21] 20 (08/09 0413) BP: (132-198)/(85-138) 135/87 (08/09 0413) SpO2:  [93 %-100 %] 93 % (08/09 0413) Weight:  [97.5 kg] 97.5 kg (08/08 1635)  Intake/Output from previous day: 08/08 0701 - 08/09 0700 In: 1866.2 [I.V.:1866.2] Out: 1650 [Urine:1650] Intake/Output this shift: No intake/output data recorded.   Physical Exam  Constitutional: He appears well-developed and well-nourished.  Cardiovascular: Normal rate and regular rhythm.  Pulmonary/Chest: Effort normal. No respiratory distress.  Abdominal: Soft. There is tenderness (suprapubic).  Musculoskeletal: Normal range of motion. He exhibits no edema or tenderness.  Vitals reviewed.   Lab Results:  Recent Labs    04/13/18 0933  WBC 5.5  HGB 13.2  HCT 40.0  PLT 243   BMET Recent  Labs    04/13/18 0933 04/14/18 0509  NA 144 137  K 3.7 4.1  CL 106 102  CO2 27 24  GLUCOSE 121* 150*  BUN 19 18  CREATININE 1.23 1.33*  CALCIUM 9.3 8.5*   PT/INR No results for input(s): LABPROT, INR in the last 72 hours. ABG No results for input(s): PHART, HCO3 in the last 72 hours.  Invalid input(s): PCO2, PO2  Studies/Results: [place] Intraoperative  Result Date: 04/13/2018 CLINICAL DATA:  Ultrasound was provided for use by the ordering physician, and a technical charge was applied by the performing facility.  No radiologist interpretation/professional services rendered.   Dg C-arm 1-60 Min-no Report  Result Date: 04/13/2018 Fluoroscopy was utilized by the requesting physician.  No radiographic interpretation.     Assessment and Plan: Prostatic/pelvic mass with hematuria, and prostatic and bilateral ureteral obstruction.   The PSA is only 0.61 suggesting either a neuroendocrine or other non-prostate adenocarcinoma origin of the mass.     Liver lesion will be evaluated with MRI today.  Bladder spasms.  I will d/c foley and begin oxybutynin.    His BP has been labile and probably secondary to the pain from the spasms.       LOS: 0 days    Irine Seal 04/14/2018 [phone] ID: Francis Gaines, male   DOB: 12/14/1965, 52 y.o.   MRN: [id number]

## 2018-04-14 NOTE — Progress Notes (Signed)
The patient's home medications were not updated by pharmacy prior to surgery. Medications were ordered before pharmacy could follow-up. I communicated this to [facility] via phone.  Patient said he was no longer taking these meds at home: famotidine, fluconazole nasal, and hydrocortisone cream. Also his metoprolol was changed from metoprolol tartrate 50mg  BID to metoprolol succinate 50mg  once daily.    Romeo Rabon, PharmD. Mobile: [phone]. 04/14/2018,12:07 PM.

## 2018-04-15 ENCOUNTER — Observation Stay (HOSPITAL_COMMUNITY): Payer: BLUE CROSS/BLUE SHIELD

## 2018-04-15 ENCOUNTER — Encounter (HOSPITAL_COMMUNITY): Payer: Self-pay

## 2018-04-15 DIAGNOSIS — C61 Malignant neoplasm of prostate: Secondary | ICD-10-CM | POA: Diagnosis not present

## 2018-04-15 DIAGNOSIS — K5900 Constipation, unspecified: Secondary | ICD-10-CM | POA: Diagnosis not present

## 2018-04-15 DIAGNOSIS — N183 Chronic kidney disease, stage 3 (moderate): Secondary | ICD-10-CM | POA: Diagnosis not present

## 2018-04-15 DIAGNOSIS — D4959 Neoplasm of unspecified behavior of other genitourinary organ: Secondary | ICD-10-CM | POA: Diagnosis not present

## 2018-04-15 DIAGNOSIS — R310 Gross hematuria: Secondary | ICD-10-CM | POA: Diagnosis not present

## 2018-04-15 DIAGNOSIS — N3289 Other specified disorders of bladder: Secondary | ICD-10-CM | POA: Diagnosis not present

## 2018-04-15 DIAGNOSIS — C494 Malignant neoplasm of connective and soft tissue of abdomen: Secondary | ICD-10-CM | POA: Diagnosis not present

## 2018-04-15 DIAGNOSIS — N131 Hydronephrosis with ureteral stricture, not elsewhere classified: Secondary | ICD-10-CM | POA: Diagnosis not present

## 2018-04-15 DIAGNOSIS — I252 Old myocardial infarction: Secondary | ICD-10-CM | POA: Diagnosis not present

## 2018-04-15 DIAGNOSIS — N401 Enlarged prostate with lower urinary tract symptoms: Secondary | ICD-10-CM | POA: Diagnosis not present

## 2018-04-15 DIAGNOSIS — I129 Hypertensive chronic kidney disease with stage 1 through stage 4 chronic kidney disease, or unspecified chronic kidney disease: Secondary | ICD-10-CM | POA: Diagnosis not present

## 2018-04-15 DIAGNOSIS — Z79899 Other long term (current) drug therapy: Secondary | ICD-10-CM | POA: Diagnosis not present

## 2018-04-15 DIAGNOSIS — N179 Acute kidney failure, unspecified: Secondary | ICD-10-CM | POA: Diagnosis not present

## 2018-04-15 LAB — CBC
HEMATOCRIT: 38.9 % — AB (ref 39.0–52.0)
Hemoglobin: 12.8 g/dL — ABNORMAL LOW (ref 13.0–17.0)
MCH: 31.6 pg (ref 26.0–34.0)
MCHC: 32.9 g/dL (ref 30.0–36.0)
MCV: 96 fL (ref 78.0–100.0)
Platelets: 226 10*3/uL (ref 150–400)
RBC: 4.05 MIL/uL — ABNORMAL LOW (ref 4.22–5.81)
RDW: 12.8 % (ref 11.5–15.5)
WBC: 11.9 10*3/uL — AB (ref 4.0–10.5)

## 2018-04-15 MED ORDER — PHENAZOPYRIDINE HCL 200 MG PO TABS
200.0000 mg | ORAL_TABLET | Freq: Three times a day (TID) | ORAL | Status: DC | PRN
Start: 2018-04-15 — End: 2018-04-19
  Administered 2018-04-18 – 2018-04-19 (×2): 200 mg via ORAL
  Filled 2018-04-15 (×3): qty 1

## 2018-04-15 MED ORDER — POLYETHYLENE GLYCOL 3350 17 G PO PACK
17.0000 g | PACK | Freq: Two times a day (BID) | ORAL | Status: AC
  Administered 2018-04-15 – 2018-04-16 (×4): 17 g via ORAL
  Filled 2018-04-15 (×4): qty 1

## 2018-04-15 MED ORDER — GADOBENATE DIMEGLUMINE 529 MG/ML IV SOLN
20.0000 mL | Freq: Once | INTRAVENOUS | Status: AC | PRN
  Administered 2018-04-15: 20 mL via INTRAVENOUS

## 2018-04-15 MED ORDER — HYDROCHLOROTHIAZIDE 12.5 MG PO CAPS
12.5000 mg | ORAL_CAPSULE | Freq: Every day | ORAL | Status: DC
  Administered 2018-04-15 – 2018-04-16 (×2): 12.5 mg via ORAL
  Filled 2018-04-15 (×2): qty 1

## 2018-04-15 NOTE — Progress Notes (Signed)
Patient ID: Dylan Gonzalez, male   DOB: 17-Jun-1966, 52 y.o.   MRN: [id number] 2 Days Post-Op  Subjective: CC: Bladder spasm.  Hx: Dylan Gonzalez has been voiding since the foley was removed but very frequently and now has a condom after he nearly fell getting out of bed.  His BP is better but he can still have exacerbations of the HTN.  I don't know if this is related to pain or other causes.   He has no other associated complaints.   The MRI has not been done yet and his path is pending.  His PSA was 0.61 in the office. ROS:  Review of Systems  Constitutional: Negative for chills and fever.  Respiratory: Negative.   Cardiovascular: Negative for chest pain, palpitations and leg swelling.  Gastrointestinal: Positive for abdominal pain. Negative for nausea.  Genitourinary: Positive for frequency, hematuria and urgency. Negative for flank pain.  All other systems reviewed and are negative.   Anti-infectives: Anti-infectives (From admission, onward)   Start     Dose/Rate Route Frequency Ordered Stop   04/13/18 1015  gentamicin (GARAMYCIN) 420 mg in dextrose 5 % 100 mL IVPB     5 mg/kg  84.2 kg (Adjusted) 221 mL/hr over 30 Minutes Intravenous 30 min pre-op 04/13/18 0913 04/13/18 1315   04/13/18 0913  cefTRIAXone (ROCEPHIN) 2 g in sodium chloride 0.9 % 100 mL IVPB     2 g 200 mL/hr over 30 Minutes Intravenous 30 min pre-op 04/13/18 0913 04/13/18 1242      Current Facility-Administered Medications  Medication Dose Route Frequency Provider Last Rate Last Dose  . 0.45 % NaCl with KCl 20 mEq / L infusion   Intravenous Continuous Irine Seal, MD 10 mL/hr at 04/15/18 0200    . acetaminophen (TYLENOL) tablet 650 mg  650 mg Oral Q4H PRN Irine Seal, MD      . ALPRAZolam Duanne Moron) tablet 0.5 mg  0.5 mg Oral BID PRN Kathie Rhodes, MD   0.5 mg at 04/14/18 2215  . bisacodyl (DULCOLAX) suppository 10 mg  10 mg Rectal Daily PRN Irine Seal, MD      . fluticasone (FLONASE) 50 MCG/ACT nasal spray 2 spray  2  spray Each Nare Daily PRN Irine Seal, MD      . HYDROmorphone (DILAUDID) injection 0.5-1 mg  0.5-1 mg Intravenous Q2H PRN Irine Seal, MD      . hydrOXYzine (ATARAX/VISTARIL) tablet 100 mg  100 mg Oral QHS PRN Irine Seal, MD      . hyoscyamine (LEVSIN SL) SL tablet 0.125 mg  0.125 mg Sublingual Q4H PRN Irine Seal, MD   0.125 mg at 04/14/18 0441  . lactated ringers infusion   Intravenous Continuous Lillia Abed, MD 50 mL/hr at 04/13/18 0940    . loratadine (CLARITIN) tablet 10 mg  10 mg Oral Daily Irine Seal, MD   10 mg at 04/14/18 1011  . LORazepam (ATIVAN) injection 1 mg  1 mg Intravenous Once PRN Irine Seal, MD      . metoprolol tartrate (LOPRESSOR) tablet 50 mg  50 mg Oral BID Irine Seal, MD   50 mg at 04/14/18 2109  . ondansetron (ZOFRAN) injection 4 mg  4 mg Intravenous Q4H PRN Irine Seal, MD      . opium-belladonna (B&O SUPPRETTES) 16.2-60 MG suppository 1 suppository  1 suppository Rectal Q8H PRN Irine Seal, MD   1 suppository at 04/13/18 2350  . oxybutynin (DITROPAN) tablet 5 mg  5 mg Oral TID Irine Seal, MD  5 mg at 04/14/18 2109  . oxyCODONE (Oxy IR/ROXICODONE) immediate release tablet 5 mg  5 mg Oral Q4H PRN Irine Seal, MD   5 mg at 04/14/18 1954  . phenazopyridine (PYRIDIUM) tablet 200 mg  200 mg Oral TID PRN Irine Seal, MD      . senna-docusate (Senokot-S) tablet 1 tablet  1 tablet Oral QHS PRN Irine Seal, MD   1 tablet at 04/13/18 1738  . sodium phosphate (FLEET) 7-19 GM/118ML enema 1 enema  1 enema Rectal Once PRN Irine Seal, MD      . spironolactone (ALDACTONE) tablet 25 mg  25 mg Oral Daily Irine Seal, MD   25 mg at 04/14/18 1010     Objective: Vital signs in last 24 hours: Temp:  [98.6 F (37 C)-98.8 F (37.1 C)] 98.6 F (37 C) (08/10 0510) Pulse Rate:  [86-92] 90 (08/10 0510) Resp:  [18-20] 18 (08/10 0510) BP: (126-155)/(82-97) 134/97 (08/10 0510) SpO2:  [95 %-100 %] 97 % (08/10 0510)  Intake/Output from previous day: 08/09 0701 - 08/10 0700 In:  1704.7 [P.O.:360; I.V.:1344.7] Out: 3275 [Urine:3275] Intake/Output this shift: No intake/output data recorded.   Physical Exam  Constitutional: He is oriented to person, place, and time. He appears well-developed and well-nourished.  Uncomfortable.   Cardiovascular: Normal rate, regular rhythm and normal heart sounds.  Pulmonary/Chest: Effort normal and breath sounds normal.  Abdominal: Soft. He exhibits no mass. There is tenderness (mild SP tenderness but bladder is not palpable. ).  Musculoskeletal: Normal range of motion. He exhibits no edema or tenderness.  Neurological: He is alert and oriented to person, place, and time.  Skin: Skin is warm and dry.  Psychiatric: He has a normal mood and affect.  Vitals reviewed.   Lab Results:  Recent Labs    04/13/18 0933  WBC 5.5  HGB 13.2  HCT 40.0  PLT 243   BMET Recent Labs    04/13/18 0933 04/14/18 0509  NA 144 137  K 3.7 4.1  CL 106 102  CO2 27 24  GLUCOSE 121* 150*  BUN 19 18  CREATININE 1.23 1.33*  CALCIUM 9.3 8.5*   PT/INR No results for input(s): LABPROT, INR in the last 72 hours. ABG No results for input(s): PHART, HCO3 in the last 72 hours.  Invalid input(s): PCO2, PO2  Studies/Results: [place] Intraoperative  Result Date: 04/13/2018 CLINICAL DATA:  Ultrasound was provided for use by the ordering physician, and a technical charge was applied by the performing facility.  No radiologist interpretation/professional services rendered.   Dg C-arm 1-60 Min-no Report  Result Date: 04/13/2018 Fluoroscopy was utilized by the requesting physician.  No radiographic interpretation.     Assessment and Plan: Prostatic/pelvic mass with hematuria, and prostatic and bilateral ureteral obstruction.   The PSA is only 0.61 suggesting either a neuroendocrine or other non-prostate adenocarcinoma origin of the mass.   Path is pending.   He is voiding post foley removal.   Liver lesion will be evaluated with MRI today.  I will  add the pelvis as well to assess local extent and tissue planes.   Bladder spasms. I will add pyridium to the oxybutynin.  With the HTN, I can't give Myrbetriq.   His BP has been labile and probably secondary to the pain from the spasms, but I am going to have the hospitalists evaluate him for recommendations and I will also get urinary metanephrines since we don't have a diagnosis for the pelvic mass and it is possible he  could have an extra-adrenal pheo amonth the other possibilities.  .        LOS: 0 days    Irine Seal 04/15/2018 [phone]

## 2018-04-15 NOTE — H&P (Signed)
Dylan Seal, MD  Physician  Urology  H&P  Signed  Date of Service:  04/14/2018 3:03 PM          Signed         Show:Clear all [] Manual[] Template[x] Copied  Added by: [x] Dylan Balkcom, MD  [] Hover for details CC: I have blood in my urine.  HPI: Dylan Gonzalez is a 52 year-old male patient who was referred by 47 who is here for blood in the urine.  He did see the blood in his urine. He first noticed the symptoms 02/27/2018. He has seen blood clots.   He does have a burning sensation when he urinates. He is currently having trouble urinating.   He is not having pain. He has not recently had unwanted weight loss.   Dylan Gonzalez is a 52 yo BM who had the onset on 6/24 for painless gross hematuria and went to the ER. No imaging was done. A culture was negative. His Cr was 1.2. He has had another episode and passed a clot about 3 days ago. He has had increased voiding symptoms and he is having some dysuira and he has severe LUTS with an IPSS of 31 since last week but he was voiding ok prior. He has microhematuria today. He has had no prior UTI's, stones or GU surgery.      CC: AUA Questions Scoring.  HPI:     AUA Symptom Score: Almost always he has the sensation of not emptying his bladder completely when finished urinating. Almost always he has to urinate again fewer than two hours after he has finished urinating. More than 50% of the time he has to start and stop again several times when he urinates. Almost always he finds it difficult to postpone urination. More than 50% of the time he has a weak urinary stream. More than 50% of the time he has to push or strain to begin urination. He has to get up to urinate 4 times from the time he goes to bed until the time he gets up in the morning.   Calculated AUA Symptom Score: 31    ALLERGIES: None   MEDICATIONS: Hydrochlorothiazide 25 mg tablet  Metoprolol Succinate 50 mg tablet, extended release 24 hr  Zyrtec  Benadryl    Spironolactone 25 mg tablet     GU PSH: None   NON-GU PSH: Laparoscopy, Surgical; Repair Umbilical Hernia - about 2016    GU PMH: None     PMH Notes: retinoblastoma left eye 1976   NON-GU PMH: Hypertension Myocardial Infarction    FAMILY HISTORY: Gout - Father   SOCIAL HISTORY: Marital Status: Married Preferred Language: English; Race: Black or African American Current Smoking Status: Patient has never smoked.   Tobacco Use Assessment Completed: Used Tobacco in last 30 days? Has never drank.  Drinks 3 caffeinated drinks per day. Patient's occupation 2017.     Notes: 2 stepdaughters, 1 stepson   REVIEW OF SYSTEMS:    GU Review Male:   Patient reports frequent urination, hard to postpone urination, burning/ pain with urination, get up at night to urinate, stream starts and stops, trouble starting your stream, have to strain to urinate , and penile pain. Patient denies leakage of urine and erection problems.  Gastrointestinal (Upper):   Patient denies nausea, vomiting, and indigestion/ heartburn.  Gastrointestinal (Lower):   Patient denies diarrhea and constipation.  Constitutional:   Patient reports night sweats. Patient denies fever, weight loss, and fatigue.  Skin:   Patient  denies skin rash/ lesion and itching.  Eyes:   Patient denies blurred vision and double vision.  Ears/ Nose/ Throat:   Patient reports sinus problems. Patient denies sore throat.  Hematologic/Lymphatic:   Patient denies swollen glands and easy bruising.  Cardiovascular:   Patient denies leg swelling and chest pains.  Respiratory:   Patient denies cough and shortness of breath.  Endocrine:   Patient reports excessive thirst.   Musculoskeletal:   Patient denies back pain and joint pain.  Neurological:   Patient denies headaches and dizziness.  Psychologic:   Patient denies depression and anxiety.   VITAL SIGNS:      04/12/2018 11:13 AM  Weight 219 lb / 99.34 kg  Height 71  in / 180.34 cm  BP 142/92 mmHg  Pulse 74 /min  Temperature 98.4 F / 36.8 C  BMI 30.5 kg/m   GU PHYSICAL EXAMINATION:    Anus and Perineum: No hemorrhoids. No anal stenosis. No rectal fissure, no anal fissure. No edema, no dimple, no perineal tenderness, no anal tenderness.  Scrotum: No lesions. No edema. No cysts. No warts.  Epididymides: Right: no spermatocele, no masses, no cysts, no tenderness, no induration, no enlargement. Left: no spermatocele, no masses, no cysts, no tenderness, no induration, no enlargement.  Testes: Atrophic left testis. Atrophic right testis. No tenderness, no swelling, no enlargement left testis. No tenderness, no swelling, no enlargement right testis. Normal location left testis. Normal location right testis. No mass, no cyst, no varicocele, no hydrocele left testis. No mass, no cyst, no varicocele, no hydrocele right testis.   Urethral Meatus: Normal size. No lesion, no wart, no discharge, no polyp. Normal location.  Penis: Circumcised, no warts, no cracks. No dorsal Peyronie's plaques, no left corporal Peyronie's plaques, no right corporal Peyronie's plaques, no scarring, no warts. No balanitis, no meatal stenosis.  Prostate: It is difficult to clearly identify the prostate, it could be large or he could have a full bladder that is giving that effect.   Seminal Vesicles: Nonpalpable.  Sphincter Tone: Normal sphincter. No rectal tenderness. No rectal mass.    MULTI-SYSTEM PHYSICAL EXAMINATION:    Constitutional: Well-nourished. No physical deformities. Normally developed. Good grooming.  Neck: Neck symmetrical, not swollen. Normal tracheal position.  Respiratory: No labored breathing, no use of accessory muscles. CTA  Cardiovascular: Normal temperature, RRR without murmur  Lymphatic: No enlargement of neck, axillae, groin.  Skin: No paleness, no jaundice, no cyanosis. No lesion, no ulcer, no rash.  Neurologic / Psychiatric: Oriented to time, oriented to place,  oriented to person. No depression, no anxiety, no agitation.  Gastrointestinal: Obese abdomen. Umbilical hernia scar. No mass, no tenderness, no rigidity.   Eyes: prosthetic left eye  Musculoskeletal: Normal gait and station of head and neck.     PAST DATA REVIEWED:  Source Of History:  Patient  Lab Test Review:   BUN/Creatinine, BMP, CBC with Diff  Records Review:   AUA Symptom Score, Previous Hospital Records  Urine Test Review:   Urinalysis  X-Ray Review: C.T. Hematuria: Reviewed Films. Discussed With Patient.    Notes:                     His labs and notes from the ER on 6/24 were reviewed. he had >50 RBC's. His Cr was 1.2 and his Hgb was normal.    PROCEDURES:         C.T. Hematuria - 74178, [phone]  CT shows a large pelvic mass 9-10cm in  diameter posterior to the prostate and base of bladder with bilateral ureteral obstruction. There is central necrosis. Full report is pending.       OMNIPAQUE 300 125CC        PVR Ultrasound - 51798  Scanned Volume: 362 cc         Urinalysis w/Scope - 81001 Dipstick Dipstick Cont'd Micro  Color: Yellow Bilirubin: Neg mg/dL WBC/hpf: NS (Not Seen)  Appearance: Clear Ketones: Neg mg/dL RBC/hpf: 3 - 10/hpf  Specific Gravity: 1.020 Blood: 2+ ery/uL Bacteria: NS (Not Seen)  pH: <=5.0 Protein: Neg mg/dL Cystals: NS (Not Seen)  Glucose: Neg mg/dL Urobilinogen: 0.2 mg/dL Casts: NS (Not Seen)    Nitrites: Neg Trichomonas: Not Present    Leukocyte Esterase: Neg leu/uL Mucous: Not Present      Epithelial Cells: NS (Not Seen)      Yeast: NS (Not Seen)      Sperm: Not Present    ASSESSMENT:      ICD-10 Details  1 GU:   Gross hematuria - R31.0 He has gross hematuria that appears to be secondary to a large pelvic mass that is posterior to the prostate. he has some degree of ureteral obstruction from the mass. I am going to get him set up to go to the OR for cystoscopy with possible ureteral stent insertion. I will attempt to biopsy the lesion  and will try both a transrectal biopsy and possible a TUR biopsy. I have reviewed the risks of the procedure in detail.   2   Urinary Urgency - R39.15 He has outlet obstruction from the mass.   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    PLAN:           Orders Labs BUN/Creatinine(Stat), PSA  X-Rays: C.T. Hematuria With and Without I.V. Contrast  X-Ray Notes: History:  Hematuria: Yes/No  Patient to see MD after exam: Yes/No  Previous exam: CT / IVP/ US/ KUB/ None  When:  Where:  Diabetic: Yes/ No  BUN/ Creatinine:  Date of last BUN Creatinine:  Weight in pounds:  Allergy- IV Contrast: Yes/ No  Conflicting diabetic meds: Yes/ No  Diabetic Meds:  Prior Authorization #: [id number]           Schedule Return Visit/Planned Activity: ASAP - Schedule Surgery             Note: Tomorrow if possible.           Document Letter(s):  Created for Patient: Clinical Summary         Notes:   CC: Dr. Donald Prose           Routing History

## 2018-04-15 NOTE — Consult Note (Signed)
Medical Consultation   Dylan Gonzalez  Dylan Gonzalez:[id number]  DOB: 11-18-1965  DOA: 04/13/2018  PCP: Dylan Prose, MD   Outpatient Specialists: Urology  Requesting physician: Dr. Jeffie Gonzalez  Reason for consultation: Elevated blood pressure   History of Present Illness: Dylan Gonzalez is an 52 y.o. 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  Patient has been doing well from a surgical standpoint, blood blood pressures were noted to be elevated with diastolic up to 017B yesterday.  Hospitalist was called to consult on elevated blood pressure. He reports he takes metoprolol 50 daily, spironolactone 25mg  and HCTZ 25mg  daily at home.  He has no complaints except constipation, minimal bowel movement since operation 8/8.  Patient reports a heart attack in 2007 , but reports stents were not placed.  Review of Systems:  As per HPI all other systems reviewed and negative.  Past Medical History: Past Medical History:  Diagnosis Date  . Hypertension   . MI (myocardial infarction) ([place])    2007    Past Surgical History: Past Surgical History:  Procedure Laterality Date  . CYSTOSCOPY W/ URETERAL STENT PLACEMENT Bilateral 04/13/2018   Procedure: CYSTOSCOPY WITH BILATERAL STENT REPLACEMENT;  Surgeon: Irine Seal, MD;  Location: [facility];  Service: Urology;  Laterality: Bilateral;  . eye removed    . HERNIA REPAIR    . INTRAOCULAR PROSTHESES INSERTION    . PROSTATE BIOPSY [name] 04/13/2018   Procedure: ULTRASOUND GUIDED PROSTATE BIOPSY;  Surgeon: Irine Seal, MD;  Location: [facility];  Service: Urology;  Laterality: [name];  . TOOTH EXTRACTION Right 04/21/2013   Procedure: EXTRACTION MOLARS;  Surgeon: [name],  DDS;  Location: [facility];  Service: Oral Surgery;  Laterality: Right;  . TRANSURETHRAL RESECTION OF PROSTATE  04/13/2018   Procedure: TRANSURETHRAL RESECTION OF THE PROSTATE (TURP);  Surgeon: Irine Seal, MD;  Location: [facility];  Service: Urology;;   Allergies:   Allergies  Allergen Reactions  . Pollen Extract Other (See Comments)    Sneezing and shortness of breath   Social History:  reports that he has never smoked. He has never used smokeless tobacco. He reports that he does not drink alcohol or use drugs.   Family History: Family History  Problem Relation Age of Onset  . Hypertension Father   . Depression Brother     Physical Exam: Vitals:   04/14/18 2001 04/14/18 2109 04/14/18 2110 04/15/18 0510  BP: (!) 148/94 126/86 126/86 (!) 134/97  Pulse: 92 92 92 90  Resp: 20   18  Temp: 98.7 F (37.1 C)  98.8 F (37.1 C) 98.6 F (37 C)  TempSrc:    Oral  SpO2: 95%   97%  Weight:      Height:        Constitutional: Alert and awake, oriented x3, not in any acute distress. Eyes: Left eye- s/p surgery for retinoblastoma at age 48,  Droopy and at a lower level that right.   ENMT: external ears and nose appear normal,  Lips appears normal, oropharynx mucosa, tongue, posterior pharynx appear normal  Neck: neck appears normal, no masses, normal ROM, no thyromegaly, no JVD  CVS: S1-S2 clear, no murmur rubs or gallops, no LE edema, normal pedal  pulses  Respiratory:  clear to auscultation bilaterally, no wheezing, rales or rhonchi. Respiratory effort normal. No accessory muscle use.  Abdomen: soft minimal Left lower domino tenderness nontender, nondistended, normal bowel sounds, no hepatosplenomegaly, no hernias.  Urine bag ~645mls pink/blood-tinged urine Musculoskeletal: : no cyanosis, clubbing or edema noted bilaterally Neuro: Cranial nerves II-XII intact, strength, sensation, reflexes Psych: judgement and insight appear normal, stable mood and affect, mental status Skin: no rashes or  lesions or ulcers, no induration or nodules   Data reviewed:  I have personally reviewed following labs and imaging studies Labs:  CBC: Recent Labs  Lab 04/13/18 0933  WBC 5.5  HGB 13.2  HCT 40.0  MCV 95.0  PLT 381    Basic Metabolic Panel: Recent Labs  Lab 04/13/18 0933 04/14/18 0509  NA 144 137  K 3.7 4.1  CL 106 102  CO2 27 24  GLUCOSE 121* 150*  BUN 19 18  CREATININE 1.23 1.33*  CALCIUM 9.3 8.5*   GFR Estimated Creatinine Clearance: 77.4 mL/min (A) (by C-G formula based on SCr of 1.33 mg/dL (H)). Liver Function Tests: Recent Labs  Lab 04/14/18 0509  AST 28  ALT 20  ALKPHOS 42  BILITOT 0.5  PROT 6.7  ALBUMIN 3.6    Inpatient Medications:   Scheduled Meds: . loratadine  10 mg Oral Daily  . metoprolol tartrate  50 mg Oral BID  . oxybutynin  5 mg Oral TID  . spironolactone  25 mg Oral Daily   Continuous Infusions: . 0.45 % NaCl with KCl 20 mEq / L 10 mL/hr at 04/15/18 0200  . lactated ringers 50 mL/hr at 04/13/18 0940    Radiological Exams on Admission: [place] Intraoperative  Result Date: 04/13/2018 CLINICAL DATA:  Ultrasound was provided for use by the ordering physician, and a technical charge was applied by the performing facility.  No radiologist interpretation/professional services rendered.   Dg C-arm 1-60 Min-no Report  Result Date: 04/13/2018 Fluoroscopy was utilized by the requesting physician.  No radiographic interpretation.   Impression/Recommendations Principal Problem:   Prostate neoplasm Active Problems:   Hypertension  HTN-elevated to 164/114, yesterday, 1 day post-op.  Improved today-systolic 829H to 371I/96V- 90s.  Home medications metoprolol spironolactone and HCTZ. -Resume home HCTZ at reduced dose 12.5mg  daily, - Continue home medications metoprolol 50 twice daily, and spironolactone 25 daily.  Prostate/pelvic neoplasm- 11cm mass per previous CT scan. PSA- 0.61.  Mass thought to be either neuroendocrine or other known prostatic  adenocarcinoma. S/p transrectal ultrasound-guided prostate biopsy, cystoscopy with insertion of bilateral double-J ureteral stents and transurethral resection of prostatic ureteral tumor. EML- minimal. -Will get post-op CBC -Surgical pathology pending -Abdominal MRI ordered today for liver lesions.  History of MI- 2007.  Reports stents were not placed.  No chest pain no shortness of breath. Does not follow-up with the cardiologist.   Left Retinoblastoma- at age 34.  DVT prophylaxis- SCDS, per primary- Urology.  Thank you for this consultation.  Our [facility] hospitalist team will follow the patient with you.   Time Spent: 27mins  Bethena Roys M.D. [facility] 04/15/2018, 8:36 AM

## 2018-04-16 DIAGNOSIS — N179 Acute kidney failure, unspecified: Secondary | ICD-10-CM | POA: Diagnosis not present

## 2018-04-16 DIAGNOSIS — I10 Essential (primary) hypertension: Secondary | ICD-10-CM

## 2018-04-16 DIAGNOSIS — I252 Old myocardial infarction: Secondary | ICD-10-CM | POA: Diagnosis not present

## 2018-04-16 DIAGNOSIS — Z79899 Other long term (current) drug therapy: Secondary | ICD-10-CM | POA: Diagnosis not present

## 2018-04-16 DIAGNOSIS — Z419 Encounter for procedure for purposes other than remedying health state, unspecified: Secondary | ICD-10-CM

## 2018-04-16 DIAGNOSIS — N3289 Other specified disorders of bladder: Secondary | ICD-10-CM | POA: Diagnosis not present

## 2018-04-16 DIAGNOSIS — K5900 Constipation, unspecified: Secondary | ICD-10-CM | POA: Diagnosis not present

## 2018-04-16 DIAGNOSIS — C61 Malignant neoplasm of prostate: Secondary | ICD-10-CM | POA: Diagnosis not present

## 2018-04-16 DIAGNOSIS — I129 Hypertensive chronic kidney disease with stage 1 through stage 4 chronic kidney disease, or unspecified chronic kidney disease: Secondary | ICD-10-CM | POA: Diagnosis not present

## 2018-04-16 DIAGNOSIS — N183 Chronic kidney disease, stage 3 (moderate): Secondary | ICD-10-CM | POA: Diagnosis not present

## 2018-04-16 DIAGNOSIS — D4959 Neoplasm of unspecified behavior of other genitourinary organ: Secondary | ICD-10-CM | POA: Diagnosis not present

## 2018-04-16 DIAGNOSIS — R310 Gross hematuria: Secondary | ICD-10-CM | POA: Diagnosis not present

## 2018-04-16 DIAGNOSIS — N131 Hydronephrosis with ureteral stricture, not elsewhere classified: Secondary | ICD-10-CM | POA: Diagnosis not present

## 2018-04-16 DIAGNOSIS — C494 Malignant neoplasm of connective and soft tissue of abdomen: Secondary | ICD-10-CM | POA: Diagnosis not present

## 2018-04-16 NOTE — Progress Notes (Signed)
PROGRESS NOTE  Dylan Gonzalez CBJ:[id number] DOB: 28-Mar-1966 DOA: 04/13/2018 PCP: Donald Prose, MD  HPI/Recap of past 24 hours:  Dylan Gonzalez is an 52 y.o. 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  Patient has been doing well from a surgical standpoint, blood blood pressures were noted to be elevated with diastolic up to 160V yesterday.  Hospitalist was called to consult on elevated blood pressure. He reports he takes metoprolol 50 daily, spironolactone 25mg  and HCTZ 25mg  daily at home.  He has no complaints except constipation, minimal bowel movement since operation 8/8.  Patient reports a heart attack in 2007 , but reports stents were not placed.  04/16/2018: Patient seen and examined at his bedside.  He has no new complaints.  Denies headache dizziness or change in vision.  Blood pressure is improved at the time of this visit 134/96.  Assessment/Plan: Principal Problem:   Prostate neoplasm Active Problems:   Hypertension \ Accelerated hypertension Has been labile per primary Improved this morning Continue HCTZ, Lopressor, and spironolactone Low-salt diet, less than 2 g/day  Hematuria post TURP Foley cath in place 167 5 cc urine output recorded in the last 24 hours Monitor hemoglobin Obtain CBC in the morning  CKD 3 Baseline creatinine 1.2 with GFR greater than 60 Avoid nephrotoxic agents/dehydration/hypotension Last creatinine 1.33 on 04/14/2018 Obtain BMP in the morning  Pelvic/prostate mass with bilateral ureteral obstruction status post TUR prostate biopsy and TRUS biopsy with bilateral ureteral stent placement POD #3 Primary team managing  Code Status: Full  Disposition Plan:  Defer to primary   DVT prophylaxis: Deferred to primary SCDs, most likely not on chemical DVT prophylaxis due to ongoing hematuria   Objective: Vitals:   04/15/18 1500 04/15/18 1700 04/15/18 2056 04/16/18 0453  BP: (!) 143/107 (!) 133/98 (!) 155/112 (!) 134/96  Pulse:  93 91 85  Resp:  18 18 16   Temp:  98.5 F (36.9 C) 97.9 F (36.6 C) 98 F (36.7 C)  TempSrc:  Oral Oral Oral  SpO2:  98% 100% 96%  Weight:      Height:        Intake/Output Summary (Last 24 hours) at 04/16/2018 1116 Last data filed at 04/16/2018 0600 Gross per 24 hour  Intake 225 ml  Output 1125 ml  Net -900 ml   Filed Weights   04/13/18 0933 04/13/18 1635  Weight: 97.5 kg 97.5 kg    Exam:  . General: 52 y.o. year-old male well developed well nourished in no acute distress.  Alert and oriented x3. . Cardiovascular: Regular rate and rhythm with no rubs or gallops.  No thyromegaly or JVD noted.   Marland Kitchen Respiratory: Clear to auscultation with no wheezes or rales. Good inspiratory effort. . Abdomen: Soft nontender nondistended with normal bowel sounds x4 quadrants. . Musculoskeletal: No lower extremity edema. 2/4 pulses in all 4 extremities. . Skin: No ulcerative lesions noted or rashes, . Psychiatry: Mood is appropriate for condition and setting   Data Reviewed: CBC: Recent Labs  Lab 04/13/18 0933 04/15/18 1011  WBC 5.5 11.9*  HGB 13.2 12.8*  HCT 40.0 38.9*  MCV 95.0 96.0  PLT 243 371   Basic Metabolic Panel: Recent Labs  Lab 04/13/18 0933 04/14/18 0509  NA 144 137  K 3.7 4.1  CL  106 102  CO2 27 24  GLUCOSE 121* 150*  BUN 19 18  CREATININE 1.23 1.33*  CALCIUM 9.3 8.5*   GFR: Estimated Creatinine Clearance: 77.4 mL/min (A) (by C-G formula based on SCr of 1.33 mg/dL (H)). Liver Function Tests: Recent Labs  Lab 04/14/18 0509  AST 28  ALT 20  ALKPHOS 42  BILITOT 0.5  PROT 6.7  ALBUMIN 3.6   No results for input(s): LIPASE, AMYLASE in the last 168 hours. No results for input(s):  AMMONIA in the last 168 hours. Coagulation Profile: No results for input(s): INR, PROTIME in the last 168 hours. Cardiac Enzymes: No results for input(s): CKTOTAL, CKMB, CKMBINDEX, TROPONINI in the last 168 hours. BNP (last 3 results) No results for input(s): PROBNP in the last 8760 hours. HbA1C: No results for input(s): HGBA1C in the last 72 hours. CBG: No results for input(s): GLUCAP in the last 168 hours. Lipid Profile: No results for input(s): CHOL, HDL, LDLCALC, TRIG, CHOLHDL, LDLDIRECT in the last 72 hours. Thyroid Function Tests: No results for input(s): TSH, T4TOTAL, FREET4, T3FREE, THYROIDAB in the last 72 hours. Anemia Panel: No results for input(s): VITAMINB12, FOLATE, FERRITIN, TIBC, IRON, RETICCTPCT in the last 72 hours. Urine analysis:    Component Value Date/Time   COLORURINE YELLOW 03/29/2018 2320   APPEARANCEUR HAZY (A) 03/29/2018 2320   LABSPEC 1.020 03/29/2018 2320   PHURINE 5.0 03/29/2018 2320   GLUCOSEU NEGATIVE 03/29/2018 2320   HGBUR LARGE (A) 03/29/2018 2320   BILIRUBINUR NEGATIVE 03/29/2018 2320   BILIRUBINUR neg 06/01/2014 1333   KETONESUR NEGATIVE 03/29/2018 2320   PROTEINUR NEGATIVE 03/29/2018 2320   UROBILINOGEN 0.2 04/19/2015 0907   NITRITE NEGATIVE 03/29/2018 2320   LEUKOCYTESUR NEGATIVE 03/29/2018 2320   Sepsis Labs: @LABRCNTIP (procalcitonin:4,lacticidven:4)  )No results found for this or any previous visit (from the past 240 hour(s)).    Studies: Mr Pelvis W Wo Contrast  Result Date: 04/15/2018 CLINICAL DATA:  Large prostate mass, liver lesions on CT EXAM: MRI ABDOMEN AND PELVIS WITHOUT AND WITH CONTRAST TECHNIQUE: Multiplanar multisequence MR imaging of the abdomen and pelvis was performed both before and after the administration of intravenous contrast. CONTRAST:  46mL MULTIHANCE GADOBENATE DIMEGLUMINE 529 MG/ML IV SOLN COMPARISON:  CT abdomen/pelvis dated 04/12/2008 FINDINGS: Lower chest: Lung bases are essentially clear. The 7 mm  subpleural nodule on recent CT is not conspicuous on MRI. Hepatobiliary: 14 mm bilobed cyst in the anterior right hepatic lobe (series 12/image 21), without enhancement, benign. Two additional sub 5 mm cysts in the lateral segment left hepatic lobe (series 12/images 18 and 20). No suspicious/enhancing hepatic lesions. Gallbladder is unremarkable. No intrahepatic or extrahepatic ductal dilatation. Pancreas:  Within normal limits. Spleen:  Within normal limits. Adrenals/Urinary Tract:  Adrenal glands are within normal limits. Kidneys are within normal limits.  No hydronephrosis. Bladder is mildly thick-walled although underdistended. Stomach/Bowel: Stomach is within normal limits. Visualized bowel is unremarkable. No evidence of bowel obstruction. Normal appendix (series 5/image 35). Vascular/Lymphatic:  No evidence of aneurysm. No suspicious abdominopelvic lymphadenopathy. Reproductive: Marked enlargement of the prostate. While the peripheral zone appears normal at the prostate apex (series 5/image 11), the central gland and right posterior peripheral zone begins to appear abnormal at the level of the mid gland (series 5/image 12), with associated 9.8 x 8.9 x 9.4 cm mass arising exophytically superiorly from the gland (series 5/image 19; series 4/image 13). The mass is favored to be of prostate origin rather than a secondary metastasis to the prostate based on the imaging  appearance. In the setting of a normal PSA, primary differential considerations include prostate neuroendocrine carcinoma versus prostate sarcoma/sarcomatoid tumor. Other:  No abdominopelvic ascites. Musculoskeletal: No focal osseous lesions. IMPRESSION: 9.8 cm mass arising exophytically superiorly from the gland, favored to be a prostate origin rather than a secondary metastasis to the prostate. In the setting of a normal PSA, primary differential considerations include prostate neuroendocrine carcinoma versus prostate sarcoma/sarcomatoid tumor.  Correlate with pending biopsy results. Benign hepatic cysts, including a dominant 14 mm cyst in the anterior right hepatic lobe. No suspicious/enhancing hepatic lesions. No evidence of metastatic disease in the abdomen/pelvis. The 7 mm subpleural nodule on recent CT is not evident on MRI. Assuming that this patient's suspected malignancy is confirmed, please note that Fleischner Society guidelines do not apply. Dedicated CT chest may be prudent for complete chest assessment/staging following confirmed tissue diagnosis. Electronically Signed   By: Julian Hy M.D.   On: 04/15/2018 15:50   Mr Abdomen W Wo Contrast  Result Date: 04/15/2018 CLINICAL DATA:  Large prostate mass, liver lesions on CT EXAM: MRI ABDOMEN AND PELVIS WITHOUT AND WITH CONTRAST TECHNIQUE: Multiplanar multisequence MR imaging of the abdomen and pelvis was performed both before and after the administration of intravenous contrast. CONTRAST:  49mL MULTIHANCE GADOBENATE DIMEGLUMINE 529 MG/ML IV SOLN COMPARISON:  CT abdomen/pelvis dated 04/12/2008 FINDINGS: Lower chest: Lung bases are essentially clear. The 7 mm subpleural nodule on recent CT is not conspicuous on MRI. Hepatobiliary: 14 mm bilobed cyst in the anterior right hepatic lobe (series 12/image 21), without enhancement, benign. Two additional sub 5 mm cysts in the lateral segment left hepatic lobe (series 12/images 18 and 20). No suspicious/enhancing hepatic lesions. Gallbladder is unremarkable. No intrahepatic or extrahepatic ductal dilatation. Pancreas:  Within normal limits. Spleen:  Within normal limits. Adrenals/Urinary Tract:  Adrenal glands are within normal limits. Kidneys are within normal limits.  No hydronephrosis. Bladder is mildly thick-walled although underdistended. Stomach/Bowel: Stomach is within normal limits. Visualized bowel is unremarkable. No evidence of bowel obstruction. Normal appendix (series 5/image 35). Vascular/Lymphatic:  No evidence of aneurysm. No  suspicious abdominopelvic lymphadenopathy. Reproductive: Marked enlargement of the prostate. While the peripheral zone appears normal at the prostate apex (series 5/image 11), the central gland and right posterior peripheral zone begins to appear abnormal at the level of the mid gland (series 5/image 12), with associated 9.8 x 8.9 x 9.4 cm mass arising exophytically superiorly from the gland (series 5/image 19; series 4/image 13). The mass is favored to be of prostate origin rather than a secondary metastasis to the prostate based on the imaging appearance. In the setting of a normal PSA, primary differential considerations include prostate neuroendocrine carcinoma versus prostate sarcoma/sarcomatoid tumor. Other:  No abdominopelvic ascites. Musculoskeletal: No focal osseous lesions. IMPRESSION: 9.8 cm mass arising exophytically superiorly from the gland, favored to be a prostate origin rather than a secondary metastasis to the prostate. In the setting of a normal PSA, primary differential considerations include prostate neuroendocrine carcinoma versus prostate sarcoma/sarcomatoid tumor. Correlate with pending biopsy results. Benign hepatic cysts, including a dominant 14 mm cyst in the anterior right hepatic lobe. No suspicious/enhancing hepatic lesions. No evidence of metastatic disease in the abdomen/pelvis. The 7 mm subpleural nodule on recent CT is not evident on MRI. Assuming that this patient's suspected malignancy is confirmed, please note that Fleischner Society guidelines do not apply. Dedicated CT chest may be prudent for complete chest assessment/staging following confirmed tissue diagnosis. Electronically Signed   By: Julian Hy  M.D.   On: 04/15/2018 15:50    Scheduled Meds: . hydrochlorothiazide  12.5 mg Oral Daily  . loratadine  10 mg Oral Daily  . metoprolol tartrate  50 mg Oral BID  . oxybutynin  5 mg Oral TID  . polyethylene glycol  17 g Oral BID  . spironolactone  25 mg Oral Daily      Continuous Infusions: . 0.45 % NaCl with KCl 20 mEq / L 10 mL/hr at 04/15/18 0200  . lactated ringers 50 mL/hr at 04/13/18 0940     LOS: 0 days     Kayleen Memos, MD [facility] Pager [phone]  If 7PM-7AM, please contact night-coverage [email or web address] Password [facility] 04/16/2018, 11:16 AM

## 2018-04-16 NOTE — Progress Notes (Signed)
3 Days Post-Op  Subjective: CC: Hematuria.  Hx: Dylan Gonzalez is doing somewhat better with the bladder pain and spasms.  His urine remains a little bloody but he is voiding good volumes.   Path is still pending from TUR and TRUS biopsies of the prostatic/pelvic mass.  MRI showed a liver cyst and no pelvic adenopathy.    ROS:  Review of Systems  Constitutional: Negative for chills and fever.  Gastrointestinal: Positive for constipation (he is passing gas.).  Genitourinary: Positive for hematuria and urgency.  All other systems reviewed and are negative.   Anti-infectives: Anti-infectives (From admission, onward)   Start     Dose/Rate Route Frequency Ordered Stop   04/13/18 1015  gentamicin (GARAMYCIN) 420 mg in dextrose 5 % 100 mL IVPB     5 mg/kg  84.2 kg (Adjusted) 221 mL/hr over 30 Minutes Intravenous 30 min pre-op 04/13/18 0913 04/13/18 1315   04/13/18 0913  cefTRIAXone (ROCEPHIN) 2 g in sodium chloride 0.9 % 100 mL IVPB     2 g 200 mL/hr over 30 Minutes Intravenous 30 min pre-op 04/13/18 0913 04/13/18 1242      Current Facility-Administered Medications  Medication Dose Route Frequency Provider Last Rate Last Dose  . 0.45 % NaCl with KCl 20 mEq / L infusion   Intravenous Continuous Irine Seal, MD 10 mL/hr at 04/15/18 0200    . acetaminophen (TYLENOL) tablet 650 mg  650 mg Oral Q4H PRN Irine Seal, MD      . ALPRAZolam Duanne Moron) tablet 0.5 mg  0.5 mg Oral BID PRN Kathie Rhodes, MD   0.5 mg at 04/15/18 2103  . bisacodyl (DULCOLAX) suppository 10 mg  10 mg Rectal Daily PRN Irine Seal, MD      . fluticasone (FLONASE) 50 MCG/ACT nasal spray 2 spray  2 spray Each Nare Daily PRN Irine Seal, MD      . hydrochlorothiazide (MICROZIDE) capsule 12.5 mg  12.5 mg Oral Daily Emokpae, Ejiroghene E, MD   12.5 mg at 04/15/18 0936  . HYDROmorphone (DILAUDID) injection 0.5-1 mg  0.5-1 mg Intravenous Q2H PRN Irine Seal, MD      . hydrOXYzine (ATARAX/VISTARIL) tablet 100 mg  100 mg Oral QHS PRN Irine Seal, MD      . hyoscyamine (LEVSIN SL) SL tablet 0.125 mg  0.125 mg Sublingual Q4H PRN Irine Seal, MD   0.125 mg at 04/14/18 0441  . lactated ringers infusion   Intravenous Continuous Lillia Abed, MD 50 mL/hr at 04/13/18 0940    . loratadine (CLARITIN) tablet 10 mg  10 mg Oral Daily Irine Seal, MD   10 mg at 04/15/18 0936  . metoprolol tartrate (LOPRESSOR) tablet 50 mg  50 mg Oral BID Irine Seal, MD   50 mg at 04/15/18 2103  . ondansetron (ZOFRAN) injection 4 mg  4 mg Intravenous Q4H PRN Irine Seal, MD      . opium-belladonna (B&O SUPPRETTES) 16.2-60 MG suppository 1 suppository  1 suppository Rectal Q8H PRN Irine Seal, MD   1 suppository at 04/13/18 2350  . oxybutynin (DITROPAN) tablet 5 mg  5 mg Oral TID Irine Seal, MD   5 mg at 04/15/18 2103  . oxyCODONE (Oxy IR/ROXICODONE) immediate release tablet 5 mg  5 mg Oral Q4H PRN Irine Seal, MD   5 mg at 04/16/18 0817  . phenazopyridine (PYRIDIUM) tablet 200 mg  200 mg Oral TID PRN Irine Seal, MD      . polyethylene glycol (MIRALAX / GLYCOLAX) packet 17 g  17 g Oral BID Emokpae, Ejiroghene E, MD   17 g at 04/16/18 0817  . senna-docusate (Senokot-S) tablet 1 tablet  1 tablet Oral QHS PRN Irine Seal, MD   1 tablet at 04/13/18 1738  . sodium phosphate (FLEET) 7-19 GM/118ML enema 1 enema  1 enema Rectal Once PRN Irine Seal, MD      . spironolactone (ALDACTONE) tablet 25 mg  25 mg Oral Daily Irine Seal, MD   25 mg at 04/15/18 0936     Objective: Vital signs in last 24 hours: Temp:  [97.9 F (36.6 C)-98.5 F (36.9 C)] 98 F (36.7 C) (08/11 0453) Pulse Rate:  [85-93] 85 (08/11 0453) Resp:  [16-18] 16 (08/11 0453) BP: (133-155)/(96-112) 134/96 (08/11 0453) SpO2:  [96 %-100 %] 96 % (08/11 0453)  Intake/Output from previous day: 08/10 0701 - 08/11 0700 In: 225 [I.V.:225] Out: 1675 [Urine:1675] Intake/Output this shift: No intake/output data recorded.   Physical Exam  Constitutional: He appears well-developed and well-nourished.   Cardiovascular: Normal rate and regular rhythm.  Pulmonary/Chest: Effort normal. No respiratory distress.  Abdominal: Soft. He exhibits no distension. There is tenderness (LLQ and SP ).  Musculoskeletal: Normal range of motion. He exhibits no edema or tenderness.  Neurological: He is alert.  Vitals reviewed.   Lab Results:  Recent Labs    04/13/18 0933 04/15/18 1011  WBC 5.5 11.9*  HGB 13.2 12.8*  HCT 40.0 38.9*  PLT 243 226   BMET Recent Labs    04/13/18 0933 04/14/18 0509  NA 144 137  K 3.7 4.1  CL 106 102  CO2 27 24  GLUCOSE 121* 150*  BUN 19 18  CREATININE 1.23 1.33*  CALCIUM 9.3 8.5*   PT/INR No results for input(s): LABPROT, INR in the last 72 hours. ABG No results for input(s): PHART, HCO3 in the last 72 hours.  Invalid input(s): PCO2, PO2  Studies/Results: Mr Pelvis W Wo Contrast  Result Date: 04/15/2018 CLINICAL DATA:  Large prostate mass, liver lesions on CT EXAM: MRI ABDOMEN AND PELVIS WITHOUT AND WITH CONTRAST TECHNIQUE: Multiplanar multisequence MR imaging of the abdomen and pelvis was performed both before and after the administration of intravenous contrast. CONTRAST:  10mL MULTIHANCE GADOBENATE DIMEGLUMINE 529 MG/ML IV SOLN COMPARISON:  CT abdomen/pelvis dated 04/12/2008 FINDINGS: Lower chest: Lung bases are essentially clear. The 7 mm subpleural nodule on recent CT is not conspicuous on MRI. Hepatobiliary: 14 mm bilobed cyst in the anterior right hepatic lobe (series 12/image 21), without enhancement, benign. Two additional sub 5 mm cysts in the lateral segment left hepatic lobe (series 12/images 18 and 20). No suspicious/enhancing hepatic lesions. Gallbladder is unremarkable. No intrahepatic or extrahepatic ductal dilatation. Pancreas:  Within normal limits. Spleen:  Within normal limits. Adrenals/Urinary Tract:  Adrenal glands are within normal limits. Kidneys are within normal limits.  No hydronephrosis. Bladder is mildly thick-walled although  underdistended. Stomach/Bowel: Stomach is within normal limits. Visualized bowel is unremarkable. No evidence of bowel obstruction. Normal appendix (series 5/image 35). Vascular/Lymphatic:  No evidence of aneurysm. No suspicious abdominopelvic lymphadenopathy. Reproductive: Marked enlargement of the prostate. While the peripheral zone appears normal at the prostate apex (series 5/image 11), the central gland and right posterior peripheral zone begins to appear abnormal at the level of the mid gland (series 5/image 12), with associated 9.8 x 8.9 x 9.4 cm mass arising exophytically superiorly from the gland (series 5/image 19; series 4/image 13). The mass is favored to be of prostate origin rather than a secondary metastasis  to the prostate based on the imaging appearance. In the setting of a normal PSA, primary differential considerations include prostate neuroendocrine carcinoma versus prostate sarcoma/sarcomatoid tumor. Other:  No abdominopelvic ascites. Musculoskeletal: No focal osseous lesions. IMPRESSION: 9.8 cm mass arising exophytically superiorly from the gland, favored to be a prostate origin rather than a secondary metastasis to the prostate. In the setting of a normal PSA, primary differential considerations include prostate neuroendocrine carcinoma versus prostate sarcoma/sarcomatoid tumor. Correlate with pending biopsy results. Benign hepatic cysts, including a dominant 14 mm cyst in the anterior right hepatic lobe. No suspicious/enhancing hepatic lesions. No evidence of metastatic disease in the abdomen/pelvis. The 7 mm subpleural nodule on recent CT is not evident on MRI. Assuming that this patient's suspected malignancy is confirmed, please note that Fleischner Society guidelines do not apply. Dedicated CT chest may be prudent for complete chest assessment/staging following confirmed tissue diagnosis. Electronically Signed   By: Julian Hy M.D.   On: 04/15/2018 15:50   Mr Abdomen W Wo  Contrast  Result Date: 04/15/2018 CLINICAL DATA:  Large prostate mass, liver lesions on CT EXAM: MRI ABDOMEN AND PELVIS WITHOUT AND WITH CONTRAST TECHNIQUE: Multiplanar multisequence MR imaging of the abdomen and pelvis was performed both before and after the administration of intravenous contrast. CONTRAST:  11mL MULTIHANCE GADOBENATE DIMEGLUMINE 529 MG/ML IV SOLN COMPARISON:  CT abdomen/pelvis dated 04/12/2008 FINDINGS: Lower chest: Lung bases are essentially clear. The 7 mm subpleural nodule on recent CT is not conspicuous on MRI. Hepatobiliary: 14 mm bilobed cyst in the anterior right hepatic lobe (series 12/image 21), without enhancement, benign. Two additional sub 5 mm cysts in the lateral segment left hepatic lobe (series 12/images 18 and 20). No suspicious/enhancing hepatic lesions. Gallbladder is unremarkable. No intrahepatic or extrahepatic ductal dilatation. Pancreas:  Within normal limits. Spleen:  Within normal limits. Adrenals/Urinary Tract:  Adrenal glands are within normal limits. Kidneys are within normal limits.  No hydronephrosis. Bladder is mildly thick-walled although underdistended. Stomach/Bowel: Stomach is within normal limits. Visualized bowel is unremarkable. No evidence of bowel obstruction. Normal appendix (series 5/image 35). Vascular/Lymphatic:  No evidence of aneurysm. No suspicious abdominopelvic lymphadenopathy. Reproductive: Marked enlargement of the prostate. While the peripheral zone appears normal at the prostate apex (series 5/image 11), the central gland and right posterior peripheral zone begins to appear abnormal at the level of the mid gland (series 5/image 12), with associated 9.8 x 8.9 x 9.4 cm mass arising exophytically superiorly from the gland (series 5/image 19; series 4/image 13). The mass is favored to be of prostate origin rather than a secondary metastasis to the prostate based on the imaging appearance. In the setting of a normal PSA, primary differential  considerations include prostate neuroendocrine carcinoma versus prostate sarcoma/sarcomatoid tumor. Other:  No abdominopelvic ascites. Musculoskeletal: No focal osseous lesions. IMPRESSION: 9.8 cm mass arising exophytically superiorly from the gland, favored to be a prostate origin rather than a secondary metastasis to the prostate. In the setting of a normal PSA, primary differential considerations include prostate neuroendocrine carcinoma versus prostate sarcoma/sarcomatoid tumor. Correlate with pending biopsy results. Benign hepatic cysts, including a dominant 14 mm cyst in the anterior right hepatic lobe. No suspicious/enhancing hepatic lesions. No evidence of metastatic disease in the abdomen/pelvis. The 7 mm subpleural nodule on recent CT is not evident on MRI. Assuming that this patient's suspected malignancy is confirmed, please note that Fleischner Society guidelines do not apply. Dedicated CT chest may be prudent for complete chest assessment/staging following confirmed tissue diagnosis. Electronically  Signed   By: Julian Hy M.D.   On: 04/15/2018 15:50   MRI films and report reviewed.   Assessment and Plan: Pelvic/Prostate mass with bilateral ureteral obstruction s/p TUR prostate biopsy and TRUS biopsy with bilateral ureteral stenting.   Path is pending.  MRI showed a liver CT and no pelvic adenopathy.  Further treatment will be based on path.   Urine remains bloody.  I will monitor for another day.   Bladder spasm.   He is doing better but still has bladder spasms.  Continue Oxybutynin.    HTN.   BP remains labile.   Urinary metanephrines ordered.       LOS: 0 days    Irine Seal 04/16/2018 [phone] ID: Francis Gaines, male   DOB: 11/10/1965, 52 y.o.   MRN: [id number]

## 2018-04-16 NOTE — Progress Notes (Signed)
Patient continues to be very concerned about not having a BM since surgery 08/08. Bowel sounds active, pt passing gas. Pt spending large amounts of time in BR, admitting to straining to force himself to have a BM. Pt educated on importance of not straining. Miralax ordered BID and given. Will be given tonight also. PRN Dulcolax suppository offered several times, pt refuses each time. Per wife, pt has a "phobia" of PR medications. Pt encouraged to ambulate in hallway to assist with bowel movements.

## 2018-04-17 DIAGNOSIS — Z419 Encounter for procedure for purposes other than remedying health state, unspecified: Secondary | ICD-10-CM | POA: Diagnosis not present

## 2018-04-17 DIAGNOSIS — C61 Malignant neoplasm of prostate: Secondary | ICD-10-CM | POA: Diagnosis not present

## 2018-04-17 DIAGNOSIS — Z79899 Other long term (current) drug therapy: Secondary | ICD-10-CM | POA: Diagnosis not present

## 2018-04-17 DIAGNOSIS — N179 Acute kidney failure, unspecified: Secondary | ICD-10-CM | POA: Diagnosis not present

## 2018-04-17 DIAGNOSIS — I10 Essential (primary) hypertension: Secondary | ICD-10-CM | POA: Diagnosis not present

## 2018-04-17 DIAGNOSIS — D4959 Neoplasm of unspecified behavior of other genitourinary organ: Secondary | ICD-10-CM | POA: Diagnosis not present

## 2018-04-17 DIAGNOSIS — K5900 Constipation, unspecified: Secondary | ICD-10-CM | POA: Diagnosis not present

## 2018-04-17 DIAGNOSIS — I252 Old myocardial infarction: Secondary | ICD-10-CM | POA: Diagnosis not present

## 2018-04-17 DIAGNOSIS — N131 Hydronephrosis with ureteral stricture, not elsewhere classified: Secondary | ICD-10-CM | POA: Diagnosis not present

## 2018-04-17 DIAGNOSIS — I129 Hypertensive chronic kidney disease with stage 1 through stage 4 chronic kidney disease, or unspecified chronic kidney disease: Secondary | ICD-10-CM | POA: Diagnosis not present

## 2018-04-17 DIAGNOSIS — N3289 Other specified disorders of bladder: Secondary | ICD-10-CM | POA: Diagnosis not present

## 2018-04-17 DIAGNOSIS — N183 Chronic kidney disease, stage 3 (moderate): Secondary | ICD-10-CM | POA: Diagnosis not present

## 2018-04-17 DIAGNOSIS — C494 Malignant neoplasm of connective and soft tissue of abdomen: Secondary | ICD-10-CM | POA: Diagnosis not present

## 2018-04-17 DIAGNOSIS — R310 Gross hematuria: Secondary | ICD-10-CM | POA: Diagnosis not present

## 2018-04-17 LAB — CBC
HEMATOCRIT: 37 % — AB (ref 39.0–52.0)
HEMOGLOBIN: 12.1 g/dL — AB (ref 13.0–17.0)
MCH: 31.4 pg (ref 26.0–34.0)
MCHC: 32.7 g/dL (ref 30.0–36.0)
MCV: 96.1 fL (ref 78.0–100.0)
Platelets: 228 10*3/uL (ref 150–400)
RBC: 3.85 MIL/uL — ABNORMAL LOW (ref 4.22–5.81)
RDW: 12.7 % (ref 11.5–15.5)
WBC: 9.3 10*3/uL (ref 4.0–10.5)

## 2018-04-17 LAB — BASIC METABOLIC PANEL
ANION GAP: 9 (ref 5–15)
BUN: 19 mg/dL (ref 6–20)
CHLORIDE: 104 mmol/L (ref 98–111)
CO2: 28 mmol/L (ref 22–32)
Calcium: 9 mg/dL (ref 8.9–10.3)
Creatinine, Ser: 1.43 mg/dL — ABNORMAL HIGH (ref 0.61–1.24)
GFR calc non Af Amer: 55 mL/min — ABNORMAL LOW (ref >60)
Glucose, Bld: 113 mg/dL — ABNORMAL HIGH (ref 70–99)
Potassium: 3.9 mmol/L (ref 3.5–5.1)
Sodium: 141 mmol/L (ref 135–145)

## 2018-04-17 LAB — MAGNESIUM: Magnesium: 2 mg/dL (ref 1.7–2.4)

## 2018-04-17 MED ORDER — AMLODIPINE BESYLATE 10 MG PO TABS
10.0000 mg | ORAL_TABLET | Freq: Every day | ORAL | Status: DC
  Administered 2018-04-17 – 2018-04-19 (×3): 10 mg via ORAL
  Filled 2018-04-17 (×4): qty 1

## 2018-04-17 MED ORDER — FLEET ENEMA 7-19 GM/118ML RE ENEM: 1.0000 | ENEMA | Freq: Every day | RECTAL | Status: DC | PRN

## 2018-04-17 NOTE — Progress Notes (Signed)
Pt. And wife expressed concerns for care when pt. Is discharged d/t wife being disabled. Requesting possible HH when discharged. Pt. And wife educated and answered all questions. Will make day RN aware of concerns and to address issue with MD during rounds.

## 2018-04-17 NOTE — Progress Notes (Signed)
4 Days Post-Op  Subjective: CC: Constipation.  Hx: Dylan Gonzalez has a large pelvic/prostatic mass and is 4 days post TUR and TRUS biopsy.  Path is pending.  continues to complain of constipation but has no nausea and is passing flatus.  His BP remains labile and his Cr is now rising slowly despite good UOP and no hydro on MRI with stents in good position.  His path is pending as are the urine metanephrines.  He has urinary urgency but his urine is clearing.   ROS:  Review of Systems  Constitutional: Negative for chills and fever.  Gastrointestinal: Positive for constipation. Negative for abdominal pain, nausea and vomiting.  Genitourinary: Positive for urgency.  All other systems reviewed and are negative.   Anti-infectives: Anti-infectives (From admission, onward)   Start     Dose/Rate Route Frequency Ordered Stop   04/13/18 1015  gentamicin (GARAMYCIN) 420 mg in dextrose 5 % 100 mL IVPB     5 mg/kg  84.2 kg (Adjusted) 221 mL/hr over 30 Minutes Intravenous 30 min pre-op 04/13/18 0913 04/13/18 1315   04/13/18 0913  cefTRIAXone (ROCEPHIN) 2 g in sodium chloride 0.9 % 100 mL IVPB     2 g 200 mL/hr over 30 Minutes Intravenous 30 min pre-op 04/13/18 0913 04/13/18 1242      Current Facility-Administered Medications  Medication Dose Route Frequency Provider Last Rate Last Dose  . 0.45 % NaCl with KCl 20 mEq / L infusion   Intravenous Continuous Irine Seal, MD 10 mL/hr at 04/17/18 0600    . acetaminophen (TYLENOL) tablet 650 mg  650 mg Oral Q4H PRN Irine Seal, MD      . ALPRAZolam Duanne Moron) tablet 0.5 mg  0.5 mg Oral BID PRN Kathie Rhodes, MD   0.5 mg at 04/16/18 2124  . amLODipine (NORVASC) tablet 10 mg  10 mg Oral Daily Irene Pap N, DO   10 mg at 04/17/18 0656  . bisacodyl (DULCOLAX) suppository 10 mg  10 mg Rectal Daily PRN Irine Seal, MD      . fluticasone (FLONASE) 50 MCG/ACT nasal spray 2 spray  2 spray Each Nare Daily PRN Irine Seal, MD      . HYDROmorphone (DILAUDID) injection  0.5-1 mg  0.5-1 mg Intravenous Q2H PRN Irine Seal, MD      . hydrOXYzine (ATARAX/VISTARIL) tablet 100 mg  100 mg Oral QHS PRN Irine Seal, MD      . hyoscyamine (LEVSIN SL) SL tablet 0.125 mg  0.125 mg Sublingual Q4H PRN Irine Seal, MD   0.125 mg at 04/14/18 0441  . lactated ringers infusion   Intravenous Continuous Lillia Abed, MD 50 mL/hr at 04/13/18 0940    . loratadine (CLARITIN) tablet 10 mg  10 mg Oral Daily Irine Seal, MD   10 mg at 04/16/18 1105  . metoprolol tartrate (LOPRESSOR) tablet 50 mg  50 mg Oral BID Irine Seal, MD   50 mg at 04/16/18 2123  . ondansetron (ZOFRAN) injection 4 mg  4 mg Intravenous Q4H PRN Irine Seal, MD      . opium-belladonna (B&O SUPPRETTES) 16.2-60 MG suppository 1 suppository  1 suppository Rectal Q8H PRN Irine Seal, MD   1 suppository at 04/13/18 2350  . oxyCODONE (Oxy IR/ROXICODONE) immediate release tablet 5 mg  5 mg Oral Q4H PRN Irine Seal, MD   5 mg at 04/16/18 2124  . phenazopyridine (PYRIDIUM) tablet 200 mg  200 mg Oral TID PRN Irine Seal, MD      . senna-docusate (Senokot-S)  tablet 1 tablet  1 tablet Oral QHS PRN Irine Seal, MD   1 tablet at 04/13/18 1738  . sodium phosphate (FLEET) 7-19 GM/118ML enema 1 enema  1 enema Rectal Once PRN Irine Seal, MD      . spironolactone (ALDACTONE) tablet 25 mg  25 mg Oral Daily Irine Seal, MD   25 mg at 04/16/18 1105     Objective: Vital signs in last 24 hours: Temp:  [98.9 F (37.2 C)-99 F (37.2 C)] 99 F (37.2 C) (08/12 0441) Pulse Rate:  [73-87] 73 (08/12 0441) Resp:  [17-20] 20 (08/12 0441) BP: (134-169)/(95-103) 169/103 (08/12 0441) SpO2:  [93 %-96 %] 96 % (08/12 0441)  Intake/Output from previous day: 08/11 0701 - 08/12 0700 In: 720.1 [P.O.:480; I.V.:240.1] Out: 2725 [Urine:2725] Intake/Output this shift: No intake/output data recorded.   Physical Exam  Constitutional: He is oriented to person, place, and time. He appears well-developed and well-nourished.  Cardiovascular: Normal  rate, regular rhythm and normal heart sounds.  Pulmonary/Chest: Effort normal and breath sounds normal. No respiratory distress.  Abdominal: Soft. Bowel sounds are normal. He exhibits no distension. There is tenderness (LLQ).  Musculoskeletal: Normal range of motion. He exhibits no edema or tenderness.  Neurological: He is alert and oriented to person, place, and time.  Skin: Skin is warm and dry.  Psychiatric: He has a normal mood and affect.    Lab Results:  Recent Labs    04/15/18 1011 04/17/18 0456  WBC 11.9* 9.3  HGB 12.8* 12.1*  HCT 38.9* 37.0*  PLT 226 228   BMET Recent Labs    04/17/18 0456  NA 141  K 3.9  CL 104  CO2 28  GLUCOSE 113*  BUN 19  CREATININE 1.43*  CALCIUM 9.0   PT/INR No results for input(s): LABPROT, INR in the last 72 hours. ABG No results for input(s): PHART, HCO3 in the last 72 hours.  Invalid input(s): PCO2, PO2  Studies/Results: Mr Pelvis W Wo Contrast  Result Date: 04/15/2018 CLINICAL DATA:  Large prostate mass, liver lesions on CT EXAM: MRI ABDOMEN AND PELVIS WITHOUT AND WITH CONTRAST TECHNIQUE: Multiplanar multisequence MR imaging of the abdomen and pelvis was performed both before and after the administration of intravenous contrast. CONTRAST:  60mL MULTIHANCE GADOBENATE DIMEGLUMINE 529 MG/ML IV SOLN COMPARISON:  CT abdomen/pelvis dated 04/12/2008 FINDINGS: Lower chest: Lung bases are essentially clear. The 7 mm subpleural nodule on recent CT is not conspicuous on MRI. Hepatobiliary: 14 mm bilobed cyst in the anterior right hepatic lobe (series 12/image 21), without enhancement, benign. Two additional sub 5 mm cysts in the lateral segment left hepatic lobe (series 12/images 18 and 20). No suspicious/enhancing hepatic lesions. Gallbladder is unremarkable. No intrahepatic or extrahepatic ductal dilatation. Pancreas:  Within normal limits. Spleen:  Within normal limits. Adrenals/Urinary Tract:  Adrenal glands are within normal limits. Kidneys are  within normal limits.  No hydronephrosis. Bladder is mildly thick-walled although underdistended. Stomach/Bowel: Stomach is within normal limits. Visualized bowel is unremarkable. No evidence of bowel obstruction. Normal appendix (series 5/image 35). Vascular/Lymphatic:  No evidence of aneurysm. No suspicious abdominopelvic lymphadenopathy. Reproductive: Marked enlargement of the prostate. While the peripheral zone appears normal at the prostate apex (series 5/image 11), the central gland and right posterior peripheral zone begins to appear abnormal at the level of the mid gland (series 5/image 12), with associated 9.8 x 8.9 x 9.4 cm mass arising exophytically superiorly from the gland (series 5/image 19; series 4/image 13). The mass is favored to  be of prostate origin rather than a secondary metastasis to the prostate based on the imaging appearance. In the setting of a normal PSA, primary differential considerations include prostate neuroendocrine carcinoma versus prostate sarcoma/sarcomatoid tumor. Other:  No abdominopelvic ascites. Musculoskeletal: No focal osseous lesions. IMPRESSION: 9.8 cm mass arising exophytically superiorly from the gland, favored to be a prostate origin rather than a secondary metastasis to the prostate. In the setting of a normal PSA, primary differential considerations include prostate neuroendocrine carcinoma versus prostate sarcoma/sarcomatoid tumor. Correlate with pending biopsy results. Benign hepatic cysts, including a dominant 14 mm cyst in the anterior right hepatic lobe. No suspicious/enhancing hepatic lesions. No evidence of metastatic disease in the abdomen/pelvis. The 7 mm subpleural nodule on recent CT is not evident on MRI. Assuming that this patient's suspected malignancy is confirmed, please note that Fleischner Society guidelines do not apply. Dedicated CT chest may be prudent for complete chest assessment/staging following confirmed tissue diagnosis. Electronically  Signed   By: Julian Hy M.D.   On: 04/15/2018 15:50   Mr Abdomen W Wo Contrast  Result Date: 04/15/2018 CLINICAL DATA:  Large prostate mass, liver lesions on CT EXAM: MRI ABDOMEN AND PELVIS WITHOUT AND WITH CONTRAST TECHNIQUE: Multiplanar multisequence MR imaging of the abdomen and pelvis was performed both before and after the administration of intravenous contrast. CONTRAST:  54mL MULTIHANCE GADOBENATE DIMEGLUMINE 529 MG/ML IV SOLN COMPARISON:  CT abdomen/pelvis dated 04/12/2008 FINDINGS: Lower chest: Lung bases are essentially clear. The 7 mm subpleural nodule on recent CT is not conspicuous on MRI. Hepatobiliary: 14 mm bilobed cyst in the anterior right hepatic lobe (series 12/image 21), without enhancement, benign. Two additional sub 5 mm cysts in the lateral segment left hepatic lobe (series 12/images 18 and 20). No suspicious/enhancing hepatic lesions. Gallbladder is unremarkable. No intrahepatic or extrahepatic ductal dilatation. Pancreas:  Within normal limits. Spleen:  Within normal limits. Adrenals/Urinary Tract:  Adrenal glands are within normal limits. Kidneys are within normal limits.  No hydronephrosis. Bladder is mildly thick-walled although underdistended. Stomach/Bowel: Stomach is within normal limits. Visualized bowel is unremarkable. No evidence of bowel obstruction. Normal appendix (series 5/image 35). Vascular/Lymphatic:  No evidence of aneurysm. No suspicious abdominopelvic lymphadenopathy. Reproductive: Marked enlargement of the prostate. While the peripheral zone appears normal at the prostate apex (series 5/image 11), the central gland and right posterior peripheral zone begins to appear abnormal at the level of the mid gland (series 5/image 12), with associated 9.8 x 8.9 x 9.4 cm mass arising exophytically superiorly from the gland (series 5/image 19; series 4/image 13). The mass is favored to be of prostate origin rather than a secondary metastasis to the prostate based on the  imaging appearance. In the setting of a normal PSA, primary differential considerations include prostate neuroendocrine carcinoma versus prostate sarcoma/sarcomatoid tumor. Other:  No abdominopelvic ascites. Musculoskeletal: No focal osseous lesions. IMPRESSION: 9.8 cm mass arising exophytically superiorly from the gland, favored to be a prostate origin rather than a secondary metastasis to the prostate. In the setting of a normal PSA, primary differential considerations include prostate neuroendocrine carcinoma versus prostate sarcoma/sarcomatoid tumor. Correlate with pending biopsy results. Benign hepatic cysts, including a dominant 14 mm cyst in the anterior right hepatic lobe. No suspicious/enhancing hepatic lesions. No evidence of metastatic disease in the abdomen/pelvis. The 7 mm subpleural nodule on recent CT is not evident on MRI. Assuming that this patient's suspected malignancy is confirmed, please note that Fleischner Society guidelines do not apply. Dedicated CT chest may be prudent  for complete chest assessment/staging following confirmed tissue diagnosis. Electronically Signed   By: Julian Hy M.D.   On: 04/15/2018 15:50     Assessment and Plan: Pelvic/prostate mass with bilateral ureteral obstruction.  Path is still pending.  I will contact pathology today.  AKI.  Cr is rising despite good UOP with stents in good position with decompressed kidneys and bladder on MRI on Saturday.  Will check with med service for recommendations.  HTN.  His BP remains labile on 3 meds.  Urine metanephrines pending.  With associated renal insufficiency, I will discuss nephrology consult with medical service.  Constipation.  This is probably more sensory from the pelvic mass, but I will d/c the oxybutynin and order a fleets enema.  He had no evidence of bowel obstruction on MRI Saturday.        LOS: 0 days    Irine Seal 04/17/2018 [phone] ID: Dylan Gonzalez, male   DOB: 05-03-1966,  52 y.o.   MRN: [id number]

## 2018-04-17 NOTE — Plan of Care (Signed)
  Problem: Health Behavior/Discharge Planning: Goal: Ability to manage health-related needs will improve Outcome: Progressing   Problem: Clinical Measurements: Goal: Ability to maintain clinical measurements within normal limits will improve Outcome: Progressing Goal: Will remain free from infection Outcome: Progressing Goal: Diagnostic test results will improve Outcome: Progressing Goal: Respiratory complications will improve Outcome: Progressing Goal: Cardiovascular complication will be avoided Outcome: Progressing   Problem: Coping: Goal: Level of anxiety will decrease Outcome: Progressing   Problem: Education: Goal: Required Educational Video(s) Outcome: Progressing   Problem: Clinical Measurements: Goal: Postoperative complications will be avoided or minimized Outcome: Progressing   Problem: Skin Integrity: Goal: Demonstration of wound healing without infection will improve Outcome: Progressing

## 2018-04-17 NOTE — Progress Notes (Signed)
CONSULT PROGRESS NOTE  Dylan Gonzalez HQI:[id number] DOB: September 12, 1965 DOA: 04/13/2018 PCP: Donald Prose, MD   Consulting physician: Dr. Jeffie Pollock  Reason for consult: Management of hypertension  HPI/Recap of past 24 hours:  Dylan Gonzalez is an 52 y.o. 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 Patient has been doing well from a surgical standpoint, blood blood pressures were noted to be elevated.  Hospitalist was called to consult on elevated blood pressure.  He reports he takes metoprolol 50 daily, spironolactone 25mg , and HCTZ 25mg  daily at home.  He has no complaints except constipation, minimal bowel movement since operation 8/8.  Patient reports a heart attack in 2007, but reports stents were not placed.  04/17/18: Patient seen and examined at bedside.  No acute events overnight.  Pain is well controlled.  Has good urine output.  Denies headache, dizziness.  Has no new complaints.  Assessment/Plan: Principal Problem:   Prostate neoplasm Active Problems:   Hypertension  Accelerated hypertension Blood pressure has been labile Stopped HCTZ due to worsening renal function Started amlodipine 10 mg daily Continue Lopressor and Spironolactone Continue low-salt diet to less than 2 g/day Continue to monitor vital signs  CKD 3 Baseline creatinine 1.2 with GFR greater than 60 Worsening renal function this morning with creatinine bump 1.43 from 1.33 Good urine output recorded DC HCTZ Continue to avoid nephrotoxic agents Repeat BMP in the morning  Hematuria post TURP Continue gentle IV fluid hydration LR at 50 cc/h Appears to be clearing out Hemoglobin is stable at 12.1  Pelvic/prostate mass with  bilateral ureteral obstruction status post TUR prostate biopsy and TRUS biopsy with bilateral ureteral stent placement POD #4 Primary team managing  Code Status: Full  Disposition Plan: Defer to primary   DVT prophylaxis: Deferred to primary SCDs, most likely not on chemical DVT prophylaxis due to hematuria   Objective: Vitals:   04/16/18 2123 04/16/18 2126 04/17/18 0441 04/17/18 1302  BP: (!) 141/95 (!) 141/95 (!) 169/103 129/90  Pulse: 86 87 73 79  Resp:  18 20 16   Temp:  98.9 F (37.2 C) 99 F (37.2 C) 98.8 F (37.1 C)  TempSrc:  Oral Oral Oral  SpO2:  93% 96% 92%  Weight:      Height:        Intake/Output Summary (Last 24 hours) at 04/17/2018 1418 Last data filed at 04/17/2018 1321 Gross per 24 hour  Intake 780.07 ml  Output 3325 ml  Net -2544.93 ml   Filed Weights   04/13/18 0933 04/13/18 1635  Weight: 97.5 kg 97.5 kg    Exam:  . General: 53 y.o. year-old male well-developed well-nourished in no acute distress.  Alert and oriented x3. . Cardiovascular: Regular rate and rhythm with no rubs or gallops.  No JVD or thyromegaly noted. Marland Kitchen Respiratory: Clear to auscultation with no wheezes or rales. Good inspiratory effort. . Abdomen: Soft nontender nondistended with normal bowel sounds x4 quadrants. . Musculoskeletal: No lower extremity edema. 2/4 pulses in all 4 extremities. . Skin: No ulcerative lesions noted or rashes . Psychiatry: Mood is appropriate for condition and setting   Data Reviewed: CBC: Recent Labs  Lab 04/13/18 0933 04/15/18 1011 04/17/18 0456  WBC 5.5 11.9* 9.3  HGB 13.2 12.8* 12.1*  HCT 40.0 38.9* 37.0*  MCV  95.0 96.0 96.1  PLT 243 226 546   Basic Metabolic Panel: Recent Labs  Lab 04/13/18 0933 04/14/18 0509 04/17/18 0456  NA 144 137 141  K 3.7 4.1 3.9  CL 106 102 104  CO2 27 24 28   GLUCOSE 121* 150* 113*  BUN 19 18 19   CREATININE 1.23 1.33* 1.43*  CALCIUM 9.3 8.5* 9.0  MG  --   --  2.0   GFR: Estimated Creatinine  Clearance: 72 mL/min (A) (by C-G formula based on SCr of 1.43 mg/dL (H)). Liver Function Tests: Recent Labs  Lab 04/14/18 0509  AST 28  ALT 20  ALKPHOS 42  BILITOT 0.5  PROT 6.7  ALBUMIN 3.6   No results for input(s): LIPASE, AMYLASE in the last 168 hours. No results for input(s): AMMONIA in the last 168 hours. Coagulation Profile: No results for input(s): INR, PROTIME in the last 168 hours. Cardiac Enzymes: No results for input(s): CKTOTAL, CKMB, CKMBINDEX, TROPONINI in the last 168 hours. BNP (last 3 results) No results for input(s): PROBNP in the last 8760 hours. HbA1C: No results for input(s): HGBA1C in the last 72 hours. CBG: No results for input(s): GLUCAP in the last 168 hours. Lipid Profile: No results for input(s): CHOL, HDL, LDLCALC, TRIG, CHOLHDL, LDLDIRECT in the last 72 hours. Thyroid Function Tests: No results for input(s): TSH, T4TOTAL, FREET4, T3FREE, THYROIDAB in the last 72 hours. Anemia Panel: No results for input(s): VITAMINB12, FOLATE, FERRITIN, TIBC, IRON, RETICCTPCT in the last 72 hours. Urine analysis:    Component Value Date/Time   COLORURINE YELLOW 03/29/2018 2320   APPEARANCEUR HAZY (A) 03/29/2018 2320   LABSPEC 1.020 03/29/2018 2320   PHURINE 5.0 03/29/2018 2320   GLUCOSEU NEGATIVE 03/29/2018 2320   HGBUR LARGE (A) 03/29/2018 2320   BILIRUBINUR NEGATIVE 03/29/2018 2320   BILIRUBINUR neg 06/01/2014 1333   KETONESUR NEGATIVE 03/29/2018 2320   PROTEINUR NEGATIVE 03/29/2018 2320   UROBILINOGEN 0.2 04/19/2015 0907   NITRITE NEGATIVE 03/29/2018 2320   LEUKOCYTESUR NEGATIVE 03/29/2018 2320   Sepsis Labs: @LABRCNTIP (procalcitonin:4,lacticidven:4)  )No results found for this or any previous visit (from the past 240 hour(s)).    Studies: No results found.  Scheduled Meds: . amLODipine  10 mg Oral Daily  . loratadine  10 mg Oral Daily  . metoprolol tartrate  50 mg Oral BID  . spironolactone  25 mg Oral Daily    Continuous Infusions: .  0.45 % NaCl with KCl 20 mEq / L 10 mL/hr at 04/17/18 0600  . lactated ringers 50 mL/hr at 04/13/18 0940     LOS: 0 days     Kayleen Memos, MD [facility] Pager [phone]  If 7PM-7AM, please contact night-coverage [email or web address] Password [facility] 04/17/2018, 2:18 PM

## 2018-04-18 ENCOUNTER — Observation Stay (HOSPITAL_COMMUNITY): Payer: BLUE CROSS/BLUE SHIELD

## 2018-04-18 ENCOUNTER — Encounter (HOSPITAL_COMMUNITY): Payer: Self-pay | Admitting: Radiology

## 2018-04-18 DIAGNOSIS — C61 Malignant neoplasm of prostate: Secondary | ICD-10-CM | POA: Diagnosis not present

## 2018-04-18 DIAGNOSIS — N3289 Other specified disorders of bladder: Secondary | ICD-10-CM | POA: Diagnosis not present

## 2018-04-18 DIAGNOSIS — I252 Old myocardial infarction: Secondary | ICD-10-CM | POA: Diagnosis not present

## 2018-04-18 DIAGNOSIS — C494 Malignant neoplasm of connective and soft tissue of abdomen: Secondary | ICD-10-CM | POA: Diagnosis not present

## 2018-04-18 DIAGNOSIS — N179 Acute kidney failure, unspecified: Secondary | ICD-10-CM | POA: Diagnosis not present

## 2018-04-18 DIAGNOSIS — R918 Other nonspecific abnormal finding of lung field: Secondary | ICD-10-CM | POA: Diagnosis not present

## 2018-04-18 DIAGNOSIS — R310 Gross hematuria: Secondary | ICD-10-CM | POA: Diagnosis not present

## 2018-04-18 DIAGNOSIS — N131 Hydronephrosis with ureteral stricture, not elsewhere classified: Secondary | ICD-10-CM | POA: Diagnosis not present

## 2018-04-18 DIAGNOSIS — I129 Hypertensive chronic kidney disease with stage 1 through stage 4 chronic kidney disease, or unspecified chronic kidney disease: Secondary | ICD-10-CM | POA: Diagnosis not present

## 2018-04-18 DIAGNOSIS — N183 Chronic kidney disease, stage 3 (moderate): Secondary | ICD-10-CM | POA: Diagnosis not present

## 2018-04-18 DIAGNOSIS — K5900 Constipation, unspecified: Secondary | ICD-10-CM | POA: Diagnosis not present

## 2018-04-18 DIAGNOSIS — Z79899 Other long term (current) drug therapy: Secondary | ICD-10-CM | POA: Diagnosis not present

## 2018-04-18 LAB — METANEPHRINES, URINE, 24 HOUR
Metaneph Total, Ur: 61 ug/L
Metanephrines, 24H Ur: 153 ug/24 hr (ref 45–290)
NORMETANEPHRINE UR: 261 ug/L
Normetanephrine, 24H Ur: 653 ug/24 hr — ABNORMAL HIGH (ref 82–500)
TOTAL VOLUME: 2500

## 2018-04-18 LAB — BASIC METABOLIC PANEL
ANION GAP: 10 (ref 5–15)
BUN: 18 mg/dL (ref 6–20)
CO2: 28 mmol/L (ref 22–32)
Calcium: 9 mg/dL (ref 8.9–10.3)
Chloride: 104 mmol/L (ref 98–111)
Creatinine, Ser: 1.23 mg/dL (ref 0.61–1.24)
GLUCOSE: 126 mg/dL — AB (ref 70–99)
POTASSIUM: 4 mmol/L (ref 3.5–5.1)
Sodium: 142 mmol/L (ref 135–145)

## 2018-04-18 MED ORDER — HYDRALAZINE HCL 20 MG/ML IJ SOLN: 5.0000 mg | Freq: Four times a day (QID) | INTRAMUSCULAR | Status: DC | PRN

## 2018-04-18 NOTE — Progress Notes (Signed)
PROGRESS NOTE    Dylan Gonzalez  BJS:[id number] DOB: 02-25-1966 DOA: 04/13/2018 PCP: Donald Prose, MD   Brief Wayland Salinas y.o.maleHTN, MI,pelvic/prostatic mass,who was admitted8/8/19to the urology service for pelvic/prostatic mass with bilateral ureteral obstruction,requiring transrectal ultrasound-guided prostate biopsy,cystoscopy with insertion of bilateral double-J ureteral stents,and transrectal resection of prostatic urethral tumor- done 04/13/18.Patient has been doing well from a surgical standpoint,blood blood pressures were noted to be elevated.Hospitalist was called toconsult on elevated blood pressure.  He reports he takes metoprolol 50 daily,spironolactone25mg ,and HCTZ 25mg  dailyat home.He has no complaints except constipation,minimal bowel movementsince operation 8/8.Patient reports a heart attack in 2007,but reports stents were not placed.  04/17/18: Patient seen and examined at bedside.  No acute events overnight.  Pain is well controlled.  Has good urine output.  Denies headache, dizziness.  Has no new complaints.   Assessment & Plan:   Principal Problem:   Primary leiomyosarcoma of intra-abdominal site [facility]) Active Problems:   Hypertension   Prostate neoplasm   1] hypertension patient has a history of hypertension and he is on multiple medications at home.  Hydrochlorothiazide thiazide has been stopped due to worsening renal function.  Currently on amlodipine 10 mg daily, Lopressor 50 mg twice a day, Aldactone 25 mg daily.  Would continue the same dose for now and monitor blood pressure.  Add PRN hydralazine.  2] AKI on CKD stage III improving with holding HCTZ.  3] status post TURP improving hematuria hemoglobin stable.  Patient has pelvic mass with bilateral radicular obstruction status post TURP and biopsy with bilateral ureteral stent placement postoperative day #5.  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  4] pulmonary nodule  -chest CT has been ordered by primary. DVT prophylaxis not on chemical DVT prophylaxis due to hematuria.  Will order SCD. Code Status: Full code Family Communication: Discussed with family who was in the room.  Disposition Plan per priMARY  Procedures: Status TURP Antimicrobials: None  Subjective: No specific complaints wants the surgeon performing the surgery.  Family by the bedside.  Objective: Vitals:   04/17/18 0441 04/17/18 1302 04/17/18 2027 04/18/18 0531  BP: (!) 169/103 129/90 (!) 140/97 (!) 140/93  Pulse: 73 79 85 76  Resp: 20 16 (!) 26 18  Temp: 99 F (37.2 C) 98.8 F (37.1 C) 97.8 F (36.6 C) 97.7 F (36.5 C)  TempSrc: Oral Oral  Oral  SpO2: 96% 92% 98% 96%  Weight:      Height:        Intake/Output Summary (Last 24 hours) at 04/18/2018 1139 Last data filed at 04/18/2018 0600 Gross per 24 hour  Intake 300 ml  Output 3525 ml  Net -3225 ml   Filed Weights   04/13/18 0933 04/13/18 1635  Weight: 97.5 kg 97.5 kg    Examination: Foley catheter draining blood-tinged urine.  General exam: Appears calm and comfortable  Respiratory system: Clear to auscultation. Respiratory effort normal. Cardiovascular system: S1 & S2 heard, RRR. No JVD, murmurs, rubs, gallops or clicks. No pedal edema. Gastrointestinal system: Abdomen is nondistended, soft and nontender. No organomegaly or masses felt. Normal bowel sounds heard. Central nervous system: Alert and oriented. No focal neurological deficits. Extremities: Symmetric 5 x 5 power. Skin: No rashes, lesions or ulcers Psychiatry: Judgement and insight appear normal. Mood & affect appropriate.     Data Reviewed: I have personally reviewed following labs and imaging studies  CBC: Recent Labs  Lab 04/13/18 0933 04/15/18 1011 04/17/18 0456  WBC 5.5 11.9* 9.3  HGB  13.2 12.8* 12.1*  HCT 40.0 38.9* 37.0*  MCV 95.0 96.0 96.1  PLT 243 226 056   Basic Metabolic Panel: Recent Labs  Lab 04/13/18 0933 04/14/18 0509  04/17/18 0456 04/18/18 0454  NA 144 137 141 142  K 3.7 4.1 3.9 4.0  CL 106 102 104 104  CO2 27 24 28 28   GLUCOSE 121* 150* 113* 126*  BUN 19 18 19 18   CREATININE 1.23 1.33* 1.43* 1.23  CALCIUM 9.3 8.5* 9.0 9.0  MG  --   --  2.0  --    GFR: Estimated Creatinine Clearance: 83.7 mL/min (by C-G formula based on SCr of 1.23 mg/dL). Liver Function Tests: Recent Labs  Lab 04/14/18 0509  AST 28  ALT 20  ALKPHOS 42  BILITOT 0.5  PROT 6.7  ALBUMIN 3.6   No results for input(s): LIPASE, AMYLASE in the last 168 hours. No results for input(s): AMMONIA in the last 168 hours. Coagulation Profile: No results for input(s): INR, PROTIME in the last 168 hours. Cardiac Enzymes: No results for input(s): CKTOTAL, CKMB, CKMBINDEX, TROPONINI in the last 168 hours. BNP (last 3 results) No results for input(s): PROBNP in the last 8760 hours. HbA1C: No results for input(s): HGBA1C in the last 72 hours. CBG: No results for input(s): GLUCAP in the last 168 hours. Lipid Profile: No results for input(s): CHOL, HDL, LDLCALC, TRIG, CHOLHDL, LDLDIRECT in the last 72 hours. Thyroid Function Tests: No results for input(s): TSH, T4TOTAL, FREET4, T3FREE, THYROIDAB in the last 72 hours. Anemia Panel: No results for input(s): VITAMINB12, FOLATE, FERRITIN, TIBC, IRON, RETICCTPCT in the last 72 hours. Sepsis Labs: No results for input(s): PROCALCITON, LATICACIDVEN in the last 168 hours.  No results found for this or any previous visit (from the past 240 hour(s)).       Radiology Studies: No results found.      Scheduled Meds: . amLODipine  10 mg Oral Daily  . loratadine  10 mg Oral Daily  . metoprolol tartrate  50 mg Oral BID  . spironolactone  25 mg Oral Daily   Continuous Infusions: . lactated ringers 50 mL/hr at 04/18/18 0916     LOS: 0 days    Georgette Shell, MD  If 7PM-7AM, please contact night-coverage Password [facility] 04/18/2018, 11:39 AM

## 2018-04-18 NOTE — Progress Notes (Signed)
5 Days Post-Op   Subjective/Chief Complaint:  1 - Prostate Leiyomyoscaroma, Likely Metastatic - high grade prostate scarcoma by TRUS and TUR biopsy 04/2018 on eval hematuria and large prostate / pelvic mass. Abd-Pelvis CT and MRI without bulky pelvic adenopathy or hydro. Some Sigmoid compression but no overt invasion. Chest CT with multifocal nodules highly suspicios for metastatic disease. At baseline voidign with some hematuria and irritative symptoms but no retention. Cr <1.5.  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 His PCP is Donald Prose MD with [facility].   Today "Ah" is seen for opinion on above, specifically to discuss role of possible cystoprostatectomy.   Objective: Vital signs in last 24 hours: Temp:  [97.7 F (36.5 C)-98.2 F (36.8 C)] 98.2 F (36.8 C) (08/13 1353) Pulse Rate:  [73-85] 73 (08/13 1353) Resp:  [18-26] 18 (08/13 1353) BP: (121-140)/(85-97) 121/85 (08/13 1353) SpO2:  [96 %-98 %] 98 % (08/13 1353) Last BM Date: 04/13/18  Intake/Output from previous day: 08/12 0701 - 08/13 0700 In: 540 [P.O.:540] Out: 4325 [Urine:4325] Intake/Output this shift: No intake/output data recorded.  EXAM: NAD, wife at bedside Non-labored breathign on room air RRR SNDNT, old umbilical incision w/o recurrent hernia Condom catheter in place with medium yellow urine, scant blood No c/c/e.  Lab Results:  Recent Labs    04/17/18 0456  WBC 9.3  HGB 12.1*  HCT 37.0*  PLT 228   BMET Recent Labs    04/17/18 0456 04/18/18 0454  NA 141 142  K 3.9 4.0  CL 104 104  CO2 28 28  GLUCOSE 113* 126*  BUN 19 18  CREATININE 1.43* 1.23  CALCIUM 9.0 9.0   PT/INR No results for input(s): LABPROT, INR in the last 72 hours. ABG No results for input(s): PHART, HCO3 in the last 72 hours.  Invalid input(s): PCO2,  PO2  Studies/Results: Ct Chest Wo Contrast  Result Date: 04/18/2018 CLINICAL DATA:  Trans urethral resection of the prostate with cystoscopy and bilateral ureteral stent placement for prostate leiomyosarcoma on 04/13/2018. EXAM: CT CHEST WITHOUT CONTRAST TECHNIQUE: Multidetector CT imaging of the chest was performed following the standard protocol without IV contrast. COMPARISON:  Chest radiographs 02/22/2017. Abdominopelvic CT 04/12/2018. FINDINGS: Cardiovascular: Mild aortic atherosclerosis. No acute vascular findings on noncontrast imaging. The heart size is normal. There is no pericardial effusion. Mediastinum/Nodes: There are no enlarged mediastinal, hilar or axillary lymph nodes.Hilar assessment is limited by the lack of intravenous contrast. The thyroid gland, trachea and esophagus demonstrate no significant findings. Lungs/Pleura: There is no pleural effusion. There are bilateral noncalcified pulmonary nodules, suspicious for metastatic disease. As seen on prior abdominal CT, there is an 8 x 9 mm nodule anteriorly in the left lower lobe on image 107/7. There is a 3 mm left upper lobe nodule on image 57/7. There is a 6 x 6 mm right upper lobe nodule on image 37/7 and a right lower lobe nodule measuring 7 mm on image 74/7. Upper abdomen: Stable 14 mm low-density lesion anteriorly in the liver (image 144/2). No acute findings seen within the visualized upper abdomen. Musculoskeletal/Chest wall: There is no chest wall mass or suspicious osseous finding. Small cervical ribs bilaterally. IMPRESSION: 1. Bilateral pulmonary nodules suspicious for metastatic disease in light of prostatic leiomyosarcoma. 2. No evidence of mediastinal adenopathy or acute thoracic findings. 3. Mild Aortic Atherosclerosis (ICD10-I70.0). Electronically Signed   By:  Richardean Sale M.D.   On: 04/18/2018 15:20    Anti-infectives: Anti-infectives (From admission, onward)   Start     Dose/Rate Route Frequency Ordered Stop    04/13/18 1015  gentamicin (GARAMYCIN) 420 mg in dextrose 5 % 100 mL IVPB     5 mg/kg  84.2 kg (Adjusted) 221 mL/hr over 30 Minutes Intravenous 30 min pre-op 04/13/18 0913 04/13/18 1315   04/13/18 0913  cefTRIAXone (ROCEPHIN) 2 g in sodium chloride 0.9 % 100 mL IVPB     2 g 200 mL/hr over 30 Minutes Intravenous 30 min pre-op 04/13/18 0913 04/13/18 1242      Assessment/Plan:  1 - Prostate Leiyomyoscaroma, Likely Metastatic - very large, rare,aggressive histology cancer that appears to be metastatic. Untreated life expecteance likely <2 years. His baseline functional status is good. Frankly discussed cystoprostatectomy and that in his case if would be unlikely curative alone if cancer already in lungs. He likely has germline cancer predisposition with this and his prior Rb.   Rec medical oncology opinion ASAP on role of chemo (as likely metastatic) and surgery and their timing.  As of now, I would tend to favor upfront chemo with restaging to verify positive response priro to proceeding with large surgery. Pt and wife have good understanding.   [facility], Geni Skorupski 04/18/2018

## 2018-04-18 NOTE — Progress Notes (Signed)
5 Days Post-Op  Subjective: Dylan Gonzalez is better today with decreased irritative voiding symptoms.  He had 3 BM's yesterday.  His cr is down to 1.23 and his urine has cleared.   He has a leiomyosarcoma of the prostate and pelvis.  He has a lung lesion that will need further evaluation to r/o mets.  Dr. Tresa Moore to see today.  ROS:  Review of Systems  All other systems reviewed and are negative.   Anti-infectives: Anti-infectives (From admission, onward)   Start     Dose/Rate Route Frequency Ordered Stop   04/13/18 1015  gentamicin (GARAMYCIN) 420 mg in dextrose 5 % 100 mL IVPB     5 mg/kg  84.2 kg (Adjusted) 221 mL/hr over 30 Minutes Intravenous 30 min pre-op 04/13/18 0913 04/13/18 1315   04/13/18 0913  cefTRIAXone (ROCEPHIN) 2 g in sodium chloride 0.9 % 100 mL IVPB     2 g 200 mL/hr over 30 Minutes Intravenous 30 min pre-op 04/13/18 0913 04/13/18 1242      Current Facility-Administered Medications  Medication Dose Route Frequency Provider Last Rate Last Dose  . acetaminophen (TYLENOL) tablet 650 mg  650 mg Oral Q4H PRN Irine Seal, MD      . ALPRAZolam Duanne Moron) tablet 0.5 mg  0.5 mg Oral BID PRN Kathie Rhodes, MD   0.5 mg at 04/16/18 2124  . amLODipine (NORVASC) tablet 10 mg  10 mg Oral Daily Irene Pap N, DO   10 mg at 04/18/18 0908  . bisacodyl (DULCOLAX) suppository 10 mg  10 mg Rectal Daily PRN Irine Seal, MD      . fluticasone (FLONASE) 50 MCG/ACT nasal spray 2 spray  2 spray Each Nare Daily PRN Irine Seal, MD      . HYDROmorphone (DILAUDID) injection 0.5-1 mg  0.5-1 mg Intravenous Q2H PRN Irine Seal, MD      . hydrOXYzine (ATARAX/VISTARIL) tablet 100 mg  100 mg Oral QHS PRN Irine Seal, MD      . hyoscyamine (LEVSIN SL) SL tablet 0.125 mg  0.125 mg Sublingual Q4H PRN Irine Seal, MD   0.125 mg at 04/18/18 0908  . lactated ringers infusion   Intravenous Continuous Lillia Abed, MD 50 mL/hr at 04/18/18 0916    . loratadine (CLARITIN) tablet 10 mg  10 mg Oral Daily Irine Seal,  MD   10 mg at 04/18/18 0908  . metoprolol tartrate (LOPRESSOR) tablet 50 mg  50 mg Oral BID Irine Seal, MD   50 mg at 04/18/18 0908  . ondansetron (ZOFRAN) injection 4 mg  4 mg Intravenous Q4H PRN Irine Seal, MD      . opium-belladonna (B&O SUPPRETTES) 16.2-60 MG suppository 1 suppository  1 suppository Rectal Q8H PRN Irine Seal, MD   1 suppository at 04/13/18 2350  . oxyCODONE (Oxy IR/ROXICODONE) immediate release tablet 5 mg  5 mg Oral Q4H PRN Irine Seal, MD   5 mg at 04/17/18 2307  . phenazopyridine (PYRIDIUM) tablet 200 mg  200 mg Oral TID PRN Irine Seal, MD   200 mg at 04/18/18 0909  . senna-docusate (Senokot-S) tablet 1 tablet  1 tablet Oral QHS PRN Irine Seal, MD   1 tablet at 04/13/18 1738  . sodium phosphate (FLEET) 7-19 GM/118ML enema 1 enema  1 enema Rectal Once PRN Irine Seal, MD      . spironolactone (ALDACTONE) tablet 25 mg  25 mg Oral Daily Irine Seal, MD   25 mg at 04/18/18 0908     Objective: Vital signs  in last 24 hours: Temp:  [97.7 F (36.5 C)-98.8 F (37.1 C)] 97.7 F (36.5 C) (08/13 0531) Pulse Rate:  [76-85] 76 (08/13 0531) Resp:  [16-26] 18 (08/13 0531) BP: (129-140)/(90-97) 140/93 (08/13 0531) SpO2:  [92 %-98 %] 96 % (08/13 0531)  Intake/Output from previous day: 08/12 0701 - 08/13 0700 In: 540 [P.O.:540] Out: 4325 [Urine:4325] Intake/Output this shift: No intake/output data recorded.   Physical Exam  Constitutional: He appears well-developed and well-nourished.  Genitourinary:  Genitourinary Comments: Urine clear in tubing.   Vitals reviewed.   Lab Results:  Recent Labs    04/15/18 1011 04/17/18 0456  WBC 11.9* 9.3  HGB 12.8* 12.1*  HCT 38.9* 37.0*  PLT 226 228   BMET Recent Labs    04/17/18 0456 04/18/18 0454  NA 141 142  K 3.9 4.0  CL 104 104  CO2 28 28  GLUCOSE 113* 126*  BUN 19 18  CREATININE 1.43* 1.23  CALCIUM 9.0 9.0   PT/INR No results for input(s): LABPROT, INR in the last 72 hours. ABG No results for  input(s): PHART, HCO3 in the last 72 hours.  Invalid input(s): PCO2, PO2  Studies/Results: No results found.   Assessment and Plan: 1. Leiomyosarcoma of the prostate with bilateral ureteral obstruction s/p stenting.   He will need surgical therapy with cystoprostatectomy with ileal conduit urinary diversion.  Dr. Tresa Moore consulted to discuss the procedure with the patient.  I had a lengthy discussion with the patient and his family about the diagnosis and needed therapy.  2. HTN.  Improving with change on meds.  3. AKI.  Secondary to HTN med  And recent obstruction.  Improving with decompression and change of meds.    4.  Pulmonary nodule.  I will get a dedicated Chest CT today.   5. Liver lesion.  This was a cyst on MRI.       LOS: 0 days    Irine Seal 04/18/2018 [phone] ID: Francis Gaines, male   DOB: 01-04-1966, 52 y.o.   MRN: [id number]

## 2018-04-19 ENCOUNTER — Encounter: Payer: Self-pay | Admitting: Oncology

## 2018-04-19 DIAGNOSIS — N135 Crossing vessel and stricture of ureter without hydronephrosis: Secondary | ICD-10-CM | POA: Diagnosis present

## 2018-04-19 DIAGNOSIS — C7800 Secondary malignant neoplasm of unspecified lung: Secondary | ICD-10-CM

## 2018-04-19 HISTORY — DX: Secondary malignant neoplasm of unspecified lung: C78.00

## 2018-04-19 MED ORDER — AMLODIPINE BESYLATE 10 MG PO TABS: 10.0000 mg | ORAL_TABLET | Freq: Every day | ORAL | 1 refills | Status: DC

## 2018-04-19 MED ORDER — METOPROLOL TARTRATE 50 MG PO TABS: 50.0000 mg | ORAL_TABLET | Freq: Two times a day (BID) | ORAL | 1 refills | Status: DC

## 2018-04-19 MED ORDER — HYOSCYAMINE SULFATE SL 0.125 MG SL SUBL: 0.1250 mg | SUBLINGUAL_TABLET | Freq: Three times a day (TID) | SUBLINGUAL | 0 refills | Status: DC | PRN

## 2018-04-19 MED ORDER — PHENAZOPYRIDINE HCL 200 MG PO TABS: 200.0000 mg | ORAL_TABLET | Freq: Three times a day (TID) | ORAL | 0 refills | Status: DC | PRN

## 2018-04-19 NOTE — Discharge Summary (Signed)
Physician Discharge Summary  Patient ID: Dylan Gonzalez MRN: [id number] DOB/AGE: [birth year] 52 y.o.  Admit date: 04/13/2018 Discharge date: 04/19/2018  Admission Diagnoses:  Primary leiomyosarcoma of intra-abdominal site [facility])  Discharge Diagnoses:  Principal Problem:   Primary leiomyosarcoma of intra-abdominal site [place]) Active Problems:   Hypertension   Prostate neoplasm   Past Medical History:  Diagnosis Date  . Hypertension   . MI (myocardial infarction) ([place])    2007    Surgeries: Procedure(s): ULTRASOUND GUIDED PROSTATE BIOPSY CYSTOSCOPY WITH BILATERAL STENT REPLACEMENT TRANSURETHRAL RESECTION OF THE PROSTATE (TURP) on 04/13/2018   Consultants (if any): Treatment Team:  Threasa Beards, MD Kayleen Memos, DO  Discharged Condition: Improved  Hospital Course: Dylan Gonzalez is an 52 y.o. male who was admitted on  04/13/2018 with a diagnosis of a prostatic/pelvic mass with bilateral ureteral obstruction and hematuria.   He was taken to the OR on 04/13/18 and had TUR and TRUS biopsies of the prostate and bilateral ureteral stent insertion.   He was found to have  Primary leiomyosarcoma of intra-abdominal site [facility]).   MRI of the abdomen and pelvis should no adenopathy or mets but a CT Chest showed multiple pulmonary nodules consistent with metastatic disease.   His BP was very labile on admission and the Hospitalist team was consulted for management and made adjustments in his meds as noted in the AVS.   He had an elevation of the Cr to 1.43 from 1.23 at baseline but that fell back to 1.23 with adjustment of his meds   His course was complicated by constipation which eventually resolved with oral therapy and also with severe frequency and urgency with bladder spasms.   This improved but he continued to have frequency and urgency.   He was evaluated by Dr. Tresa Moore for possible Cystoprostatectomy but with the subsequent finding of the pulmonary metastases, he will be sent in consultation to  Dr. Alen Blew for consideration of systemic therapy.     He was given perioperative antibiotics:  Anti-infectives (From admission, onward)   Start     Dose/Rate Route Frequency Ordered Stop   04/13/18 1015  gentamicin (GARAMYCIN) 420 mg in dextrose 5 % 100 mL IVPB     5 mg/kg  84.2 kg (Adjusted) 221 mL/hr over 30 Minutes Intravenous 30 min pre-op 04/13/18 0913 04/13/18 1315   04/13/18 0913  cefTRIAXone (ROCEPHIN) 2 g in sodium chloride 0.9 % 100 mL IVPB     2 g 200 mL/hr over 30 Minutes Intravenous 30 min pre-op 04/13/18 0913 04/13/18 1242    .  He was given sequential compression devices,for DVT prophylaxis.  He benefited maximally from the hospital stay and there were no complications.    Recent vital signs:  Vitals:   04/18/18 2059 04/19/18 0412  BP: 129/85 132/90  Pulse: 92 67  Resp: 20 16  Temp: 98.2 F (36.8 C) 98.3 F (36.8 C)  SpO2: 99% 97%    Recent laboratory studies:  Lab Results  Component Value Date   HGB 12.1 (L) 04/17/2018   HGB 12.8 (L) 04/15/2018   HGB 13.2 04/13/2018   Lab Results  Component Value Date   WBC 9.3 04/17/2018   PLT 228 04/17/2018   No results found for: INR Lab Results  Component Value Date   NA 142 04/18/2018   K 4.0 04/18/2018   CL 104 04/18/2018   CO2 28 04/18/2018   BUN 18 04/18/2018   CREATININE 1.23 04/18/2018   GLUCOSE 126 (H) 04/18/2018  Discharge Medications:   Allergies as of 04/19/2018      Reactions   Pollen Extract Other (See Comments)   Sneezing and shortness of breath      Medication List    STOP taking these medications   diphenhydrAMINE 25 mg capsule Commonly known as:  BENADRYL   fluconazole 150 MG tablet Commonly known as:  DIFLUCAN   hydrochlorothiazide 25 MG tablet Commonly known as:  HYDRODIURIL   metoprolol succinate 50 MG 24 hr tablet Commonly known as:  TOPROL-XL   spironolactone 25 MG tablet Commonly known as:  ALDACTONE     TAKE these medications   amLODipine 10 MG  tablet Commonly known as:  NORVASC Take 1 tablet (10 mg total) by mouth daily.   cetirizine 10 MG tablet Commonly known as:  ZYRTEC Take 10 mg by mouth daily as needed for allergies.   famotidine 20 MG tablet Commonly known as:  PEPCID Take 1 tablet (20 mg total) by mouth 2 (two) times daily.   fluticasone 50 MCG/ACT nasal spray Commonly known as:  FLONASE PLACE 2 SPRAYS INTO BOTH NOSTRILS DAILY.   HYDROcodone-acetaminophen 5-325 MG tablet Commonly known as:  NORCO/VICODIN Take 1 tablet by mouth every 6 (six) hours as needed for moderate pain.   hydrocortisone cream 1 % Apply to affected area 2 times daily   hydrOXYzine 100 MG capsule Commonly known as:  VISTARIL TAKE ONE CAPSULE BY MOUTH AT BEDTIME AS NEEDED FOR ALLERGY   Hyoscyamine Sulfate SL 0.125 MG Subl Place 0.125 mg under the tongue every 8 (eight) hours as needed.   metoprolol tartrate 50 MG tablet Commonly known as:  LOPRESSOR Take 1 tablet (50 mg total) by mouth 2 (two) times daily.   multivitamin tablet Take 1 tablet by mouth daily.   phenazopyridine 200 MG tablet Commonly known as:  PYRIDIUM Take 1 tablet (200 mg total) by mouth 3 (three) times daily as needed (burning).       Diagnostic Studies: Ct Chest Wo Contrast  Result Date: 04/18/2018 CLINICAL DATA:  Trans urethral resection of the prostate with cystoscopy and bilateral ureteral stent placement for prostate leiomyosarcoma on 04/13/2018. EXAM: CT CHEST WITHOUT CONTRAST TECHNIQUE: Multidetector CT imaging of the chest was performed following the standard protocol without IV contrast. COMPARISON:  Chest radiographs 02/22/2017. Abdominopelvic CT 04/12/2018. FINDINGS: Cardiovascular: Mild aortic atherosclerosis. No acute vascular findings on noncontrast imaging. The heart size is normal. There is no pericardial effusion. Mediastinum/Nodes: There are no enlarged mediastinal, hilar or axillary lymph nodes.Hilar assessment is limited by the lack of  intravenous contrast. The thyroid gland, trachea and esophagus demonstrate no significant findings. Lungs/Pleura: There is no pleural effusion. There are bilateral noncalcified pulmonary nodules, suspicious for metastatic disease. As seen on prior abdominal CT, there is an 8 x 9 mm nodule anteriorly in the left lower lobe on image 107/7. There is a 3 mm left upper lobe nodule on image 57/7. There is a 6 x 6 mm right upper lobe nodule on image 37/7 and a right lower lobe nodule measuring 7 mm on image 74/7. Upper abdomen: Stable 14 mm low-density lesion anteriorly in the liver (image 144/2). No acute findings seen within the visualized upper abdomen. Musculoskeletal/Chest wall: There is no chest wall mass or suspicious osseous finding. Small cervical ribs bilaterally. IMPRESSION: 1. Bilateral pulmonary nodules suspicious for metastatic disease in light of prostatic leiomyosarcoma. 2. No evidence of mediastinal adenopathy or acute thoracic findings. 3. Mild Aortic Atherosclerosis (ICD10-I70.0). Electronically Signed   By: Gwyndolyn Saxon  Lin Landsman M.D.   On: 04/18/2018 15:20   Mr Pelvis W Wo Contrast  Result Date: 04/15/2018 CLINICAL DATA:  Large prostate mass, liver lesions on CT EXAM: MRI ABDOMEN AND PELVIS WITHOUT AND WITH CONTRAST TECHNIQUE: Multiplanar multisequence MR imaging of the abdomen and pelvis was performed both before and after the administration of intravenous contrast. CONTRAST:  42mL MULTIHANCE GADOBENATE DIMEGLUMINE 529 MG/ML IV SOLN COMPARISON:  CT abdomen/pelvis dated 04/12/2008 FINDINGS: Lower chest: Lung bases are essentially clear. The 7 mm subpleural nodule on recent CT is not conspicuous on MRI. Hepatobiliary: 14 mm bilobed cyst in the anterior right hepatic lobe (series 12/image 21), without enhancement, benign. Two additional sub 5 mm cysts in the lateral segment left hepatic lobe (series 12/images 18 and 20). No suspicious/enhancing hepatic lesions. Gallbladder is unremarkable. No intrahepatic  or extrahepatic ductal dilatation. Pancreas:  Within normal limits. Spleen:  Within normal limits. Adrenals/Urinary Tract:  Adrenal glands are within normal limits. Kidneys are within normal limits.  No hydronephrosis. Bladder is mildly thick-walled although underdistended. Stomach/Bowel: Stomach is within normal limits. Visualized bowel is unremarkable. No evidence of bowel obstruction. Normal appendix (series 5/image 35). Vascular/Lymphatic:  No evidence of aneurysm. No suspicious abdominopelvic lymphadenopathy. Reproductive: Marked enlargement of the prostate. While the peripheral zone appears normal at the prostate apex (series 5/image 11), the central gland and right posterior peripheral zone begins to appear abnormal at the level of the mid gland (series 5/image 12), with associated 9.8 x 8.9 x 9.4 cm mass arising exophytically superiorly from the gland (series 5/image 19; series 4/image 13). The mass is favored to be of prostate origin rather than a secondary metastasis to the prostate based on the imaging appearance. In the setting of a normal PSA, primary differential considerations include prostate neuroendocrine carcinoma versus prostate sarcoma/sarcomatoid tumor. Other:  No abdominopelvic ascites. Musculoskeletal: No focal osseous lesions. IMPRESSION: 9.8 cm mass arising exophytically superiorly from the gland, favored to be a prostate origin rather than a secondary metastasis to the prostate. In the setting of a normal PSA, primary differential considerations include prostate neuroendocrine carcinoma versus prostate sarcoma/sarcomatoid tumor. Correlate with pending biopsy results. Benign hepatic cysts, including a dominant 14 mm cyst in the anterior right hepatic lobe. No suspicious/enhancing hepatic lesions. No evidence of metastatic disease in the abdomen/pelvis. The 7 mm subpleural nodule on recent CT is not evident on MRI. Assuming that this patient's suspected malignancy is confirmed, please note  that Fleischner Society guidelines do not apply. Dedicated CT chest may be prudent for complete chest assessment/staging following confirmed tissue diagnosis. Electronically Signed   By: Julian Hy M.D.   On: 04/15/2018 15:50   Mr Abdomen W Wo Contrast  Result Date: 04/15/2018 CLINICAL DATA:  Large prostate mass, liver lesions on CT EXAM: MRI ABDOMEN AND PELVIS WITHOUT AND WITH CONTRAST TECHNIQUE: Multiplanar multisequence MR imaging of the abdomen and pelvis was performed both before and after the administration of intravenous contrast. CONTRAST:  55mL MULTIHANCE GADOBENATE DIMEGLUMINE 529 MG/ML IV SOLN COMPARISON:  CT abdomen/pelvis dated 04/12/2008 FINDINGS: Lower chest: Lung bases are essentially clear. The 7 mm subpleural nodule on recent CT is not conspicuous on MRI. Hepatobiliary: 14 mm bilobed cyst in the anterior right hepatic lobe (series 12/image 21), without enhancement, benign. Two additional sub 5 mm cysts in the lateral segment left hepatic lobe (series 12/images 18 and 20). No suspicious/enhancing hepatic lesions. Gallbladder is unremarkable. No intrahepatic or extrahepatic ductal dilatation. Pancreas:  Within normal limits. Spleen:  Within normal limits. Adrenals/Urinary Tract:  Adrenal glands are within normal limits. Kidneys are within normal limits.  No hydronephrosis. Bladder is mildly thick-walled although underdistended. Stomach/Bowel: Stomach is within normal limits. Visualized bowel is unremarkable. No evidence of bowel obstruction. Normal appendix (series 5/image 35). Vascular/Lymphatic:  No evidence of aneurysm. No suspicious abdominopelvic lymphadenopathy. Reproductive: Marked enlargement of the prostate. While the peripheral zone appears normal at the prostate apex (series 5/image 11), the central gland and right posterior peripheral zone begins to appear abnormal at the level of the mid gland (series 5/image 12), with associated 9.8 x 8.9 x 9.4 cm mass arising exophytically  superiorly from the gland (series 5/image 19; series 4/image 13). The mass is favored to be of prostate origin rather than a secondary metastasis to the prostate based on the imaging appearance. In the setting of a normal PSA, primary differential considerations include prostate neuroendocrine carcinoma versus prostate sarcoma/sarcomatoid tumor. Other:  No abdominopelvic ascites. Musculoskeletal: No focal osseous lesions. IMPRESSION: 9.8 cm mass arising exophytically superiorly from the gland, favored to be a prostate origin rather than a secondary metastasis to the prostate. In the setting of a normal PSA, primary differential considerations include prostate neuroendocrine carcinoma versus prostate sarcoma/sarcomatoid tumor. Correlate with pending biopsy results. Benign hepatic cysts, including a dominant 14 mm cyst in the anterior right hepatic lobe. No suspicious/enhancing hepatic lesions. No evidence of metastatic disease in the abdomen/pelvis. The 7 mm subpleural nodule on recent CT is not evident on MRI. Assuming that this patient's suspected malignancy is confirmed, please note that Fleischner Society guidelines do not apply. Dedicated CT chest may be prudent for complete chest assessment/staging following confirmed tissue diagnosis. Electronically Signed   By: Julian Hy M.D.   On: 04/15/2018 15:50   [place] Intraoperative  Result Date: 04/13/2018 CLINICAL DATA:  Ultrasound was provided for use by the ordering physician, and a technical charge was applied by the performing facility.  No radiologist interpretation/professional services rendered.   Dg C-arm 1-60 Min-no Report  Result Date: 04/13/2018 Fluoroscopy was utilized by the requesting physician.  No radiographic interpretation.    Disposition: Discharge disposition: 01-Home or Self Care       Discharge Instructions    Call MD for:  difficulty breathing, headache or visual disturbances   Complete by:  As directed    Call MD for:   extreme fatigue   Complete by:  As directed    Call MD for:  hives   Complete by:  As directed    Call MD for:  persistant dizziness or light-headedness   Complete by:  As directed    Call MD for:  persistant nausea and vomiting   Complete by:  As directed    Call MD for:  severe uncontrolled pain   Complete by:  As directed    Call MD for:  temperature >100.4   Complete by:  As directed    Diet - low sodium heart healthy   Complete by:  As directed    Increase activity slowly   Complete by:  As directed       Follow-up Information    Irine Seal, MD Follow up.   Specialty:  Urology Why:  You don't need to come to the f/u appointment on 8/15.  I will have that rescheduled.  Contact information: [place] [address] [phone]        [name], MD Follow up.   Specialty:  Oncology Contact information: [place] [address] [phone]  Donald Prose, MD Follow up.   Specialty:  Family Medicine Contact information: [address] [address] [phone]            Signed: Irine Seal 04/19/2018, 4:39 PM

## 2018-04-19 NOTE — Evaluation (Signed)
Physical Therapy One Time Evaluation Patient Details Name: Dylan Gonzalez MRN: [id number] DOB: August 06, 1966 Today's Date: 04/19/2018   History of Present Illness  Pt is a 52 y.o. male with PMHx of  HTN, MI, pelvic/prostatic mass, who was admitted 04/13/18 to the urology service for pelvic/prostatic mass with bilateral ureteral obstruction; found to have Prostate Leiyomyoscaroma, Likely Metastatic - high grade prostate scarcoma by TRUS and TUR biopsy 04/2018 on eval hematuria and large prostate / pelvic mass  Clinical Impression  Patient evaluated by Physical Therapy with no further acute PT needs identified. All education has been completed and the patient has no further questions.  Pt ambulated in hallway good distance and only reports mild dizziness he believes from pain meds.  Pt aware to sit down if dizziness does not resolve for safety (once home).  Pt initiated conversation of possible need for therapy while undergoing chemo so encouraged him to discuss this with his oncologist since he is likely to feel weaker and more fatigued during treatment, however no immediate PT needs upon d/c today observed.  Pt agreeable to this. PT is signing off. Thank you for this referral.     Follow Up Recommendations No PT follow up    Equipment Recommendations  None recommended by PT    Recommendations for Other Services       Precautions / Restrictions Precautions Precautions: None      Mobility  Bed Mobility Overal bed mobility: Modified Independent                Transfers Overall transfer level: Modified independent                  Ambulation/Gait Ambulation/Gait assistance: Modified independent (Device/Increase time);Supervision Gait Distance (Feet): 400 Feet Assistive device: None Gait Pattern/deviations: WFL(Within Functional Limits)     General Gait Details: pt reports minimal dizziness due to pain meds taken earlier however no other gait deviations or symptoms  observed  Stairs            Wheelchair Mobility    Modified Rankin (Stroke Patients Only)       Balance                                             Pertinent Vitals/Pain Pain Assessment: Faces Faces Pain Scale: Hurts little more Pain Descriptors / Indicators: Discomfort Pain Intervention(s): Premedicated before session    Home Living Family/patient expects to be discharged to:: Private residence Living Arrangements: Spouse/significant other     Home Access: Stairs to enter   Technical brewer of Steps: 2 Home Layout: One level Home Equipment: Cane - single point      Prior Function Level of Independence: Independent               Hand Dominance        Extremity/Trunk Assessment   Upper Extremity Assessment Upper Extremity Assessment: Overall WFL for tasks assessed    Lower Extremity Assessment Lower Extremity Assessment: Overall WFL for tasks assessed       Communication   Communication: No difficulties  Cognition Arousal/Alertness: Awake/alert Behavior During Therapy: WFL for tasks assessed/performed Overall Cognitive Status: Within Functional Limits for tasks assessed  General Comments      Exercises     Assessment/Plan    PT Assessment Patent does not need any further PT services  PT Problem List         PT Treatment Interventions      PT Goals (Current goals can be found in the Care Plan section)  Acute Rehab PT Goals PT Goal Formulation: All assessment and education complete, DC therapy    Frequency     Barriers to discharge        Co-evaluation               AM-PAC PT "6 Clicks" Daily Activity  Outcome Measure Difficulty turning over in bed (including adjusting bedclothes, sheets and blankets)?: None Difficulty moving from lying on back to sitting on the side of the bed? : None Difficulty sitting down on and standing up from a  chair with arms (e.g., wheelchair, bedside commode, etc,.)?: None Help needed moving to and from a bed to chair (including a wheelchair)?: None Help needed walking in hospital room?: None Help needed climbing 3-5 steps with a railing? : A Little 6 Click Score: 23    End of Session   Activity Tolerance: Patient tolerated treatment well Patient left: in bed;with call bell/phone within reach Nurse Communication: Mobility status PT Visit Diagnosis: Other abnormalities of gait and mobility (R26.89)    Time: 6387-5643 PT Time Calculation (min) (ACUTE ONLY): 18 min   Charges:   PT Evaluation $PT Eval Low Complexity: 1 Low         Dylan Gonzalez, PT, DPT 04/19/2018 Pager: [phone] [place] Ram E 04/19/2018, 10:48 AM

## 2018-04-19 NOTE — Progress Notes (Signed)
PROGRESS NOTE    Dylan Gonzalez  MWN:[id number] DOB: [birth year] DOA: 04/13/2018 PCP: Donald Prose, MD  Brief Narrative:52 y.o.maleHTN, MI,pelvic/prostatic mass,who was admitted8/8/19to the urology service for pelvic/prostatic mass with bilateral ureteral obstruction,requiring transrectal ultrasound-guided prostate biopsy,cystoscopy with insertion of bilateral double-J ureteral stents,and transrectal resection of prostatic urethral tumor- done 04/13/18.Patient has been doing well from a surgical standpoint,blood blood pressures were noted to be elevated.Hospitalist was called toconsult on elevated blood pressure.  He reports he takes metoprolol 50 daily,spironolactone25mg ,and HCTZ 25mg  dailyat home.He has no complaints except constipation,minimal bowel movementsince operation 8/8.Patient reports a heart attack in 2007,but reports stents were not placed.  04/17/18: Patient seen and examined at bedside. No acute events overnight. Pain is well controlled. Has good urine output.Denies headache, dizziness. Has no new complaints.  04/19/2018 patient resting in bed Foley catheter draining blood-tinged urine. Assessment & Plan:   Principal Problem:   Primary leiomyosarcoma of intra-abdominal site [facility]) Active Problems:   Hypertension   Prostate neoplasm  1] hypertension patient has a history of hypertension and he is on multiple medications at home.  Hydrochlorothiazide thiazide has been stopped due to worsening renal function.  Continue amlodipine 10 mg daily, Lopressor 50 twice a day.  I would not discharge him home on Aldactone due to recent AKI and possible hyperkalemia.  Follow-up with primary care physician.  2] AKI on CKD stage III improving with holding HCTZ.  3] status post TURP improving hematuria hemoglobin stable.  Patient has pelvic mass with bilateral radicular obstruction status post TURP and biopsy with bilateral ureteral stent placement postoperative day  #5.  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  She is to be discharged home today to follow-up with Dr. Tresa Moore and Dr. Alen Blew as an outpatient.  Has been already arranged by Dr. Jeffie Pollock.    DVT prophylaxis: SCD Code Status: Full code Family Communication: Discussed with patient and his wife Disposition Plan: DC home today per primary Consultants:    Procedures: Antimicrobials:  Subjective:   Objective: Vitals:   04/18/18 0531 04/18/18 1353 04/18/18 2059 04/19/18 0412  BP: (!) 140/93 121/85 129/85 132/90  Pulse: 76 73 92 67  Resp: 18 18 20 16   Temp: 97.7 F (36.5 C) 98.2 F (36.8 C) 98.2 F (36.8 C) 98.3 F (36.8 C)  TempSrc: Oral Oral Oral Oral  SpO2: 96% 98% 99% 97%  Weight:      Height:        Intake/Output Summary (Last 24 hours) at 04/19/2018 1133 Last data filed at 04/19/2018 0600 Gross per 24 hour  Intake 720 ml  Output 2600 ml  Net -1880 ml   Filed Weights   04/13/18 0933 04/13/18 1635  Weight: 97.5 kg 97.5 kg    Examination:  General exam: Appears calm and comfortable  Respiratory system: Clear to auscultation. Respiratory effort normal. Cardiovascular system: S1 & S2 heard, RRR. No JVD, murmurs, rubs, gallops or clicks. No pedal edema. Gastrointestinal system: Abdomen is nondistended, soft and nontender. No organomegaly or masses felt. Normal bowel sounds heard. Central nervous system: Alert and oriented. No focal neurological deficits. Extremities: Symmetric 5 x 5 power. Skin: No rashes, lesions or ulcers Psychiatry: Judgement and insight appear normal. Mood & affect appropriate.     Data Reviewed: I have personally reviewed following labs and imaging studies  CBC: Recent Labs  Lab 04/13/18 0933 04/15/18 1011 04/17/18 0456  WBC 5.5 11.9* 9.3  HGB 13.2 12.8* 12.1*  HCT 40.0 38.9* 37.0*  MCV 95.0 96.0  96.1  PLT 243 226 161   Basic Metabolic Panel: Recent Labs  Lab 04/13/18 0933 04/14/18 0509 04/17/18 0456 04/18/18 0454    NA 144 137 141 142  K 3.7 4.1 3.9 4.0  CL 106 102 104 104  CO2 27 24 28 28   GLUCOSE 121* 150* 113* 126*  BUN 19 18 19 18   CREATININE 1.23 1.33* 1.43* 1.23  CALCIUM 9.3 8.5* 9.0 9.0  MG  --   --  2.0  --    GFR: Estimated Creatinine Clearance: 83.7 mL/min (by C-G formula based on SCr of 1.23 mg/dL). Liver Function Tests: Recent Labs  Lab 04/14/18 0509  AST 28  ALT 20  ALKPHOS 42  BILITOT 0.5  PROT 6.7  ALBUMIN 3.6   No results for input(s): LIPASE, AMYLASE in the last 168 hours. No results for input(s): AMMONIA in the last 168 hours. Coagulation Profile: No results for input(s): INR, PROTIME in the last 168 hours. Cardiac Enzymes: No results for input(s): CKTOTAL, CKMB, CKMBINDEX, TROPONINI in the last 168 hours. BNP (last 3 results) No results for input(s): PROBNP in the last 8760 hours. HbA1C: No results for input(s): HGBA1C in the last 72 hours. CBG: No results for input(s): GLUCAP in the last 168 hours. Lipid Profile: No results for input(s): CHOL, HDL, LDLCALC, TRIG, CHOLHDL, LDLDIRECT in the last 72 hours. Thyroid Function Tests: No results for input(s): TSH, T4TOTAL, FREET4, T3FREE, THYROIDAB in the last 72 hours. Anemia Panel: No results for input(s): VITAMINB12, FOLATE, FERRITIN, TIBC, IRON, RETICCTPCT in the last 72 hours. Sepsis Labs: No results for input(s): PROCALCITON, LATICACIDVEN in the last 168 hours.  No results found for this or any previous visit (from the past 240 hour(s)).       Radiology Studies: Ct Chest Wo Contrast  Result Date: 04/18/2018 CLINICAL DATA:  Trans urethral resection of the prostate with cystoscopy and bilateral ureteral stent placement for prostate leiomyosarcoma on 04/13/2018. EXAM: CT CHEST WITHOUT CONTRAST TECHNIQUE: Multidetector CT imaging of the chest was performed following the standard protocol without IV contrast. COMPARISON:  Chest radiographs 02/22/2017. Abdominopelvic CT 04/12/2018. FINDINGS: Cardiovascular: Mild  aortic atherosclerosis. No acute vascular findings on noncontrast imaging. The heart size is normal. There is no pericardial effusion. Mediastinum/Nodes: There are no enlarged mediastinal, hilar or axillary lymph nodes.Hilar assessment is limited by the lack of intravenous contrast. The thyroid gland, trachea and esophagus demonstrate no significant findings. Lungs/Pleura: There is no pleural effusion. There are bilateral noncalcified pulmonary nodules, suspicious for metastatic disease. As seen on prior abdominal CT, there is an 8 x 9 mm nodule anteriorly in the left lower lobe on image 107/7. There is a 3 mm left upper lobe nodule on image 57/7. There is a 6 x 6 mm right upper lobe nodule on image 37/7 and a right lower lobe nodule measuring 7 mm on image 74/7. Upper abdomen: Stable 14 mm low-density lesion anteriorly in the liver (image 144/2). No acute findings seen within the visualized upper abdomen. Musculoskeletal/Chest wall: There is no chest wall mass or suspicious osseous finding. Small cervical ribs bilaterally. IMPRESSION: 1. Bilateral pulmonary nodules suspicious for metastatic disease in light of prostatic leiomyosarcoma. 2. No evidence of mediastinal adenopathy or acute thoracic findings. 3. Mild Aortic Atherosclerosis (ICD10-I70.0). Electronically Signed   By: Richardean Sale M.D.   On: 04/18/2018 15:20        Scheduled Meds: . amLODipine  10 mg Oral Daily  . loratadine  10 mg Oral Daily  . metoprolol tartrate  50 mg Oral BID  . spironolactone  25 mg Oral Daily   Continuous Infusions: . lactated ringers Stopped (04/19/18 0946)     LOS: 1 day      Georgette Shell, MD [facility]  If 7PM-7AM, please contact night-coverage [email or web address] Password [facility] 04/19/2018, 11:33 AM

## 2018-04-19 NOTE — Care Management Note (Signed)
Case Management Note  Patient Details  Name: Dylan Gonzalez MRN: [id number] Date of Birth: 08-21-1966  Subjective/Objective: Admitted w/Leiomyosarcoma intra abdominal site. From home w/spouse. Spoke to patient about d/c plans, & concerns-patient wanted a condom cath for d/c-informed that the nurse will advise him on if they can provide from the floor-I recc that if nurse can't provide then this is an item he can purchase from medical supply store or [employer]/[facility],states he is able to use urinal on his own,also the nurse tech has provided teach back w/urinal use per nsg, concerns about FMLA, & disability process-I informed him this process is through his [place]. He has a pharmacy, his pcp is [facility]-has a pamphlet in rm from the office w/tel#-patient will call on own for pcp f/u appt now. Has his Dad to provide transp home today. Patient voiced understanding. Updated Dr. Johnette Abraham. [place]/Nsg. No further CM needs.              Action/Plan:d/c home.   Expected Discharge Date:  04/19/18               Expected Discharge Plan:  Home/Self Care  In-House Referral:     Discharge planning Services  CM Consult  Post Acute Care Choice:    Choice offered to:     DME Arranged:    DME Agency:     HH Arranged:    HH Agency:     Status of Service:  Completed, signed off  If discussed at [employer] of Stay Meetings, dates discussed:    Additional Comments:  Dessa Phi, RN 04/19/2018, 12:03 PM

## 2018-04-20 ENCOUNTER — Telehealth: Payer: Self-pay | Admitting: Oncology

## 2018-04-20 NOTE — Telephone Encounter (Signed)
Cld the patient and left a vm with the appt of 8/19 at 10am for the pt to see Dr. Alen Blew. Appt information left on his voicemail with appt information. Letter mailed.

## 2018-04-22 DIAGNOSIS — K5900 Constipation, unspecified: Secondary | ICD-10-CM | POA: Diagnosis not present

## 2018-04-23 ENCOUNTER — Emergency Department (HOSPITAL_COMMUNITY)
Admission: EM | Admit: 2018-04-23 | Discharge: 2018-04-23 | Disposition: A | Discharge status: Home / Self Care | Payer: BLUE CROSS/BLUE SHIELD | Source: Home / Self Care | Attending: Emergency Medicine | Admitting: Emergency Medicine

## 2018-04-23 ENCOUNTER — Encounter (HOSPITAL_COMMUNITY): Payer: Self-pay | Admitting: Emergency Medicine

## 2018-04-23 ENCOUNTER — Emergency Department (HOSPITAL_COMMUNITY)
Admission: EM | Admit: 2018-04-23 | Discharge: 2018-04-23 | Disposition: A | Discharge status: Home / Self Care | Payer: BLUE CROSS/BLUE SHIELD | Attending: Emergency Medicine | Admitting: Emergency Medicine

## 2018-04-23 ENCOUNTER — Emergency Department (HOSPITAL_COMMUNITY): Payer: BLUE CROSS/BLUE SHIELD

## 2018-04-23 ENCOUNTER — Other Ambulatory Visit: Payer: Self-pay

## 2018-04-23 ENCOUNTER — Encounter (HOSPITAL_COMMUNITY): Payer: Self-pay

## 2018-04-23 DIAGNOSIS — N3289 Other specified disorders of bladder: Secondary | ICD-10-CM

## 2018-04-23 DIAGNOSIS — R319 Hematuria, unspecified: Principal | ICD-10-CM

## 2018-04-23 DIAGNOSIS — R1900 Intra-abdominal and pelvic swelling, mass and lump, unspecified site: Secondary | ICD-10-CM

## 2018-04-23 DIAGNOSIS — K429 Umbilical hernia without obstruction or gangrene: Secondary | ICD-10-CM | POA: Diagnosis not present

## 2018-04-23 DIAGNOSIS — R339 Retention of urine, unspecified: Secondary | ICD-10-CM | POA: Insufficient documentation

## 2018-04-23 DIAGNOSIS — Z85840 Personal history of malignant neoplasm of eye: Secondary | ICD-10-CM

## 2018-04-23 DIAGNOSIS — Z79899 Other long term (current) drug therapy: Secondary | ICD-10-CM | POA: Insufficient documentation

## 2018-04-23 DIAGNOSIS — Z85118 Personal history of other malignant neoplasm of bronchus and lung: Secondary | ICD-10-CM | POA: Insufficient documentation

## 2018-04-23 DIAGNOSIS — R300 Dysuria: Secondary | ICD-10-CM | POA: Diagnosis not present

## 2018-04-23 DIAGNOSIS — I10 Essential (primary) hypertension: Secondary | ICD-10-CM

## 2018-04-23 DIAGNOSIS — I252 Old myocardial infarction: Secondary | ICD-10-CM | POA: Insufficient documentation

## 2018-04-23 DIAGNOSIS — N390 Urinary tract infection, site not specified: Secondary | ICD-10-CM

## 2018-04-23 DIAGNOSIS — C3490 Malignant neoplasm of unspecified part of unspecified bronchus or lung: Secondary | ICD-10-CM | POA: Diagnosis not present

## 2018-04-23 DIAGNOSIS — D075 Carcinoma in situ of prostate: Secondary | ICD-10-CM | POA: Diagnosis not present

## 2018-04-23 DIAGNOSIS — R1030 Lower abdominal pain, unspecified: Secondary | ICD-10-CM | POA: Diagnosis not present

## 2018-04-23 LAB — CBC WITH DIFFERENTIAL/PLATELET
Basophils Absolute: 0.1 10*3/uL (ref 0.0–0.1)
Basophils Relative: 1 %
EOS ABS: 0.4 10*3/uL (ref 0.0–0.7)
EOS PCT: 4 %
HCT: 35.2 % — ABNORMAL LOW (ref 39.0–52.0)
Hemoglobin: 11.5 g/dL — ABNORMAL LOW (ref 13.0–17.0)
LYMPHS ABS: 2.5 10*3/uL (ref 0.7–4.0)
Lymphocytes Relative: 23 %
MCH: 31.7 pg (ref 26.0–34.0)
MCHC: 32.7 g/dL (ref 30.0–36.0)
MCV: 97 fL (ref 78.0–100.0)
MONO ABS: 0.9 10*3/uL (ref 0.1–1.0)
MONOS PCT: 8 %
Neutro Abs: 7 10*3/uL (ref 1.7–7.7)
Neutrophils Relative %: 64 %
PLATELETS: 305 10*3/uL (ref 150–400)
RBC: 3.63 MIL/uL — AB (ref 4.22–5.81)
RDW: 12.4 % (ref 11.5–15.5)
WBC: 10.9 10*3/uL — AB (ref 4.0–10.5)

## 2018-04-23 LAB — BASIC METABOLIC PANEL
Anion gap: 9 (ref 5–15)
BUN: 14 mg/dL (ref 6–20)
CHLORIDE: 103 mmol/L (ref 98–111)
CO2: 29 mmol/L (ref 22–32)
Calcium: 9.5 mg/dL (ref 8.9–10.3)
Creatinine, Ser: 1.28 mg/dL — ABNORMAL HIGH (ref 0.61–1.24)
GFR calc Af Amer: >60 mL/min (ref >60)
GFR calc non Af Amer: >60 mL/min (ref >60)
GLUCOSE: 139 mg/dL — AB (ref 70–99)
POTASSIUM: 3.6 mmol/L (ref 3.5–5.1)
Sodium: 141 mmol/L (ref 135–145)

## 2018-04-23 LAB — URINALYSIS, ROUTINE W REFLEX MICROSCOPIC
BILIRUBIN URINE: NEGATIVE
Glucose, UA: NEGATIVE mg/dL
Ketones, ur: NEGATIVE mg/dL
Nitrite: POSITIVE — AB
Protein, ur: 100 mg/dL — AB
SPECIFIC GRAVITY, URINE: 1.005 (ref 1.005–1.030)
pH: 6 (ref 5.0–8.0)

## 2018-04-23 LAB — COMPREHENSIVE METABOLIC PANEL
ALT: 21 U/L (ref 0–44)
ANION GAP: 10 (ref 5–15)
AST: 28 U/L (ref 15–41)
Albumin: 4.1 g/dL (ref 3.5–5.0)
Alkaline Phosphatase: 42 U/L (ref 38–126)
BUN: 16 mg/dL (ref 6–20)
CO2: 26 mmol/L (ref 22–32)
Calcium: 9.3 mg/dL (ref 8.9–10.3)
Chloride: 102 mmol/L (ref 98–111)
Creatinine, Ser: 1.26 mg/dL — ABNORMAL HIGH (ref 0.61–1.24)
GFR calc non Af Amer: >60 mL/min (ref >60)
Glucose, Bld: 118 mg/dL — ABNORMAL HIGH (ref 70–99)
POTASSIUM: 3.8 mmol/L (ref 3.5–5.1)
SODIUM: 138 mmol/L (ref 135–145)
Total Bilirubin: 1.1 mg/dL (ref 0.3–1.2)
Total Protein: 7.4 g/dL (ref 6.5–8.1)

## 2018-04-23 LAB — CBC
HCT: 37.5 % — ABNORMAL LOW (ref 39.0–52.0)
HEMOGLOBIN: 12.2 g/dL — AB (ref 13.0–17.0)
MCH: 31.5 pg (ref 26.0–34.0)
MCHC: 32.5 g/dL (ref 30.0–36.0)
MCV: 96.9 fL (ref 78.0–100.0)
Platelets: 359 10*3/uL (ref 150–400)
RBC: 3.87 MIL/uL — AB (ref 4.22–5.81)
RDW: 12.7 % (ref 11.5–15.5)
WBC: 11.5 10*3/uL — ABNORMAL HIGH (ref 4.0–10.5)

## 2018-04-23 MED ORDER — BELLADONNA ALKALOIDS-OPIUM 16.2-60 MG RE SUPP
1.0000 | Freq: Once | RECTAL | Status: AC
  Administered 2018-04-23: 1 via RECTAL
  Filled 2018-04-23: qty 1

## 2018-04-23 MED ORDER — SODIUM CHLORIDE 0.9 % IV BOLUS
1000.0000 mL | Freq: Once | INTRAVENOUS | Status: AC
  Administered 2018-04-23: 1000 mL via INTRAVENOUS

## 2018-04-23 MED ORDER — SODIUM CHLORIDE 0.9 % IV SOLN
1.0000 g | Freq: Once | INTRAVENOUS | Status: AC
  Administered 2018-04-23: 1 g via INTRAVENOUS
  Filled 2018-04-23: qty 10

## 2018-04-23 MED ORDER — LIDOCAINE HCL URETHRAL/MUCOSAL 2 % EX GEL
1.0000 "application " | Freq: Once | CUTANEOUS | Status: AC
  Administered 2018-04-23: 1 via URETHRAL
  Filled 2018-04-23: qty 5

## 2018-04-23 MED ORDER — MORPHINE SULFATE (PF) 4 MG/ML IV SOLN
4.0000 mg | Freq: Once | INTRAVENOUS | Status: AC
Start: 2018-04-23 — End: 2018-04-23
  Administered 2018-04-23: 4 mg via INTRAVENOUS
  Filled 2018-04-23: qty 1

## 2018-04-23 MED ORDER — POLYETHYLENE GLYCOL 3350 17 G PO PACK: 17.0000 g | PACK | Freq: Two times a day (BID) | ORAL | 0 refills | Status: AC | PRN

## 2018-04-23 MED ORDER — CEFUROXIME AXETIL 250 MG PO TABS: 250.0000 mg | ORAL_TABLET | Freq: Two times a day (BID) | ORAL | 0 refills | Status: DC

## 2018-04-23 MED ORDER — BELLADONNA ALKALOIDS-OPIUM 16.2-30 MG RE SUPP: 30.0000 mg | Freq: Three times a day (TID) | RECTAL | 0 refills | Status: DC | PRN

## 2018-04-23 NOTE — ED Notes (Signed)
Patient ambulated to BR with minimal assistance.

## 2018-04-23 NOTE — ED Notes (Signed)
Bed: EQ68 Expected date:  Expected time:  Means of arrival:  Comments: EMS TURP/stent-abdominal pain

## 2018-04-23 NOTE — ED Notes (Signed)
Patient educated on how to charge out his foley cath bag to leg bag. Patient verbalized understanding.

## 2018-04-23 NOTE — ED Triage Notes (Signed)
He c/o persistent dysuria and frequency. He is in no distress. I recognize him as leaving our facility a few hours ago today.

## 2018-04-23 NOTE — ED Provider Notes (Signed)
[facility] DEPT Provider Note   CSN: [id number] Arrival date & time: 04/23/18  0029     History   Chief Complaint Chief Complaint  Patient presents with  . Abdominal Pain    HPI Dylan Gonzalez is a 52 y.o. male.  Patient presents to the emergency department for evaluation of abdominal pain, bilateral flank pain, urinary frequency, constipation.  Patient recently diagnosed with a pelvic mass, underwent TURP, cystoscopy, bilateral ureteral stenting on August 8 because the mass was causing bilateral ureteral obstruction.  He has had discomfort after the ureteral stents have been placed, but over the last couple of days he has had increased discomfort which he has attributed to constipation.  He has been passing gas but has not been able to have a bowel movement.  No nausea or vomiting.     Past Medical History:  Diagnosis Date  . Hypertension   . MI (myocardial infarction) [facility])    2007    Patient Active Problem List   Diagnosis Date Noted  . Bilateral ureteral obstruction 04/19/2018  . 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 Primary leiomyosarcoma of intra-abdominal site ([place]) 04/18/2018  . Prostate neoplasm 04/13/2018  . Hypertension 12/26/2011  . 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 CYSTOSCOPY W/ URETERAL STENT PLACEMENT Bilateral 04/13/2018   Procedure: CYSTOSCOPY WITH BILATERAL STENT REPLACEMENT;  Surgeon: Irine Seal, MD;  Location: [facility];  Service: Urology;  Laterality: Bilateral;  . eye removed    . HERNIA REPAIR    . INTRAOCULAR PROSTHESES INSERTION    . PROSTATE BIOPSY [name] 04/13/2018   Procedure: ULTRASOUND GUIDED PROSTATE BIOPSY;  Surgeon: Irine Seal, MD;  Location: [facility];  Service: Urology;  Laterality: [name];  . TOOTH EXTRACTION Right 04/21/2013   Procedure: EXTRACTION MOLARS;  Surgeon: [name], DDS;  Location: [facility];  Service: Oral Surgery;   Laterality: Right;  . TRANSURETHRAL RESECTION OF PROSTATE  04/13/2018   Procedure: TRANSURETHRAL RESECTION OF THE PROSTATE (TURP);  Surgeon: Irine Seal, MD;  Location: [facility];  Service: Urology;;        Home Medications    Prior to Admission medications   Medication Sig Start Date End Date Taking? Authorizing Provider  amLODipine (NORVASC) 10 MG tablet Take 1 tablet (10 mg total) by mouth daily. 04/19/18  Yes Georgette Shell, MD  cetirizine (ZYRTEC) 10 MG tablet Take 10 mg by mouth daily as needed for allergies.   Yes [provider]  HYDROcodone-acetaminophen (NORCO/VICODIN) 5-325 MG tablet Take 1 tablet by mouth every 6 (six) hours as needed for moderate pain. 04/13/18 04/13/19 Yes Irine Seal, MD  hydrOXYzine (VISTARIL) 100 MG capsule TAKE ONE CAPSULE BY MOUTH AT BEDTIME AS NEEDED FOR ALLERGY Patient taking differently: Take 100 mg by mouth at bedtime as needed (allergies).  10/12/15  Yes Robyn Haber, MD  Hyoscyamine Sulfate SL (LEVSIN/SL) 0.125 MG SUBL Place 0.125 mg under the tongue every 8 (eight) hours as needed. Patient taking differently: Place 0.125 mg under the tongue every 8 (eight) hours as needed (cramping).  04/19/18  Yes Georgette Shell, MD  metoprolol tartrate (LOPRESSOR) 50 MG tablet Take 1 tablet (50 mg total) by mouth 2 (two) times daily. 04/19/18  Yes Georgette Shell, MD  Multiple Vitamin (MULTIVITAMIN) tablet Take 1 tablet by mouth daily.   Yes [provider]  belladonna-opium (B&O SUPPRETTES) 16.2-30 MG suppository Place 1 suppository (30 mg total) rectally  every 8 (eight) hours as needed for pain. 04/23/18   Orpah Greek, MD  cefUROXime (CEFTIN) 250 MG tablet Take 1 tablet (250 mg total) by mouth 2 (two) times daily with a meal. 04/23/18   Pollina, Gwenyth Allegra, MD  fluticasone (FLONASE) 50 MCG/ACT nasal spray PLACE 2 SPRAYS INTO BOTH NOSTRILS DAILY. Patient not taking: Reported on 04/13/2018 07/22/14   Rikki Spearing P, DO    hydrocortisone cream 1 % Apply to affected area 2 times daily Patient not taking: Reported on 04/13/2018 08/05/17   Albesa Seen, PA-C  phenazopyridine (PYRIDIUM) 200 MG tablet Take 1 tablet (200 mg total) by mouth 3 (three) times daily as needed (burning). Patient not taking: Reported on 04/23/2018 04/19/18   Georgette Shell, MD  polyethylene glycol [facility] Floria Raveling) packet Take 17 g by mouth 2 (two) times daily as needed for mild constipation or moderate constipation. 04/23/18   Orpah Greek, MD    Family History Family History  Problem Relation Age of Onset  . Hypertension Father   . Depression Brother     Social History Social History   Tobacco Use  . Smoking status: Never Smoker  . Smokeless tobacco: Never Used  Substance Use Topics  . Alcohol use: No  . Drug use: No     Allergies   Pollen extract   Review of Systems Review of Systems  Gastrointestinal: Positive for abdominal pain and constipation.  Genitourinary: Positive for dysuria, flank pain and frequency.  All other systems reviewed and are negative.    Physical Exam Updated Vital Signs BP 130/80 (BP Location: Right Arm)   Pulse 88   Temp 98.1 F (36.7 C) (Oral)   Resp 20   SpO2 100%   Physical Exam  Constitutional: He is oriented to person, place, and time. He appears well-developed and well-nourished. No distress.  HENT:  Head: Normocephalic and atraumatic.  Right Ear: Hearing normal.  Left Ear: Hearing normal.  Nose: Nose normal.  Mouth/Throat: Oropharynx is clear and moist and mucous membranes are normal.  Eyes: Pupils are equal, round, and reactive to light. Conjunctivae and EOM are normal.  Neck: Normal range of motion. Neck supple.  Cardiovascular: Regular rhythm, S1 normal and S2 normal. Exam reveals no gallop and no friction rub.  No murmur heard. Pulmonary/Chest: Effort normal and breath sounds normal. No respiratory distress. He exhibits no tenderness.  Abdominal:  Soft. Normal appearance and bowel sounds are normal. There is no hepatosplenomegaly. There is generalized tenderness and tenderness in the suprapubic area. There is no rebound, no guarding, no tenderness at McBurney's point and negative Murphy's sign. No hernia.  Genitourinary: Rectal exam shows mass (prostate). Prostate is enlarged.  Musculoskeletal: Normal range of motion.  Neurological: He is alert and oriented to person, place, and time. He has normal strength. No cranial nerve deficit or sensory deficit. Coordination normal. GCS eye subscore is 4. GCS verbal subscore is 5. GCS motor subscore is 6.  Skin: Skin is warm, dry and intact. No rash noted. No cyanosis.  Psychiatric: He has a normal mood and affect. His speech is normal and behavior is normal. Thought content normal.  Nursing note and vitals reviewed.    ED Treatments / Results  Labs (all labs ordered are listed, but only abnormal results are displayed) Labs Reviewed  URINALYSIS, ROUTINE W REFLEX MICROSCOPIC - Abnormal; Notable for the following components:      Result Value   Color, Urine AMBER (*)    Hgb urine  dipstick LARGE (*)    Protein, ur 100 (*)    Nitrite POSITIVE (*)    Leukocytes, UA LARGE (*)    Bacteria, UA RARE (*)    All other components within normal limits  CBC WITH DIFFERENTIAL/PLATELET - Abnormal; Notable for the following components:   WBC 10.9 (*)    RBC 3.63 (*)    Hemoglobin 11.5 (*)    HCT 35.2 (*)    All other components within normal limits  COMPREHENSIVE METABOLIC PANEL - Abnormal; Notable for the following components:   Glucose, Bld 118 (*)    Creatinine, Ser 1.26 (*)    All other components within normal limits  URINE CULTURE    EKG None  Radiology Ct Renal Stone Study  Result Date: 04/23/2018 CLINICAL DATA:  Lower abdominal pain and constipation for 24 hours. Trans urethral prostate resection and stent on 04/19/2018. EXAM: CT ABDOMEN AND PELVIS WITHOUT CONTRAST TECHNIQUE:  Multidetector CT imaging of the abdomen and pelvis was performed following the standard protocol without IV contrast. COMPARISON:  MRI pelvis 04/15/2018. CT abdomen and pelvis 04/12/2018. FINDINGS: Lower chest: Circumscribed noncalcified nodule in the left lower lung measuring 9 mm diameter. Hepatobiliary: Low-attenuation nodule in segment 5 of the liver measuring 13 mm diameter and in segment 6 measuring 6 mm diameter. These lesions are present on the previous study. There are too small to characterize by CT, but were shown to represent benign cysts at MRI. Gallbladder and bile ducts are unremarkable. Pancreas: Unremarkable. No pancreatic ductal dilatation or surrounding inflammatory changes. Spleen: Normal in size without focal abnormality. Adrenals/Urinary Tract: No adrenal gland nodules. Bilateral ureteral stents with proximal pigtails in the renal pelvis ease and distal pigtails in the bladder. Mild residual ureteral dilatation. Stranding around the ureters. Bladder wall is not thickened and no bladder filling defects are identified. Stomach/Bowel: Stomach, small bowel, and colon are not abnormally distended. Stool fills the colon. No wall thickening is identified although under distention limits evaluation. Appendix is normal. There is a small periumbilical hernia containing fat with a small knuckle of small bowel. No proximal obstruction. Vascular/Lymphatic: No significant vascular findings are present. No enlarged abdominal or pelvic lymph nodes. Reproductive: There is a large pelvic mass likely arising from the prostate gland, measuring 8.9 x 10.2 cm in diameter. Other: No free air or free fluid in the abdomen. Musculoskeletal: Mild degenerative changes in the spine. No destructive bone lesions. IMPRESSION: 1. Large pelvic mass likely arising from the prostate gland, measuring 8.9 x 10.2 cm in diameter. 2. Small periumbilical hernia containing fat with a small knuckle of small bowel. No proximal  obstruction. 3. Low-attenuation lesions in segment 5 of the liver are too small to characterize by CT but were shown to represent benign cysts at MRI. 4. 9 mm nodule in the left lower lung. Non-contrast chest CT at 3-6 months is recommended. If the nodules are stable at time of repeat CT, then future CT at 18-24 months (from today's scan) is considered optional for low-risk patients, but is recommended for high-risk patients. This recommendation follows the consensus statement: Guidelines for Management of Incidental Pulmonary Nodules Detected on CT Images: From the Fleischner Society 2017; Radiology 2017; 284:228-243. 5. Bilateral ureteral stents. Stranding around the ureters may represent inflammatory change. Infection not excluded. Electronically Signed   By: Lucienne Capers M.D.   On: 04/23/2018 03:22    Procedures Procedures (including critical care time)  Medications Ordered in ED Medications  cefTRIAXone (ROCEPHIN) 1 g in sodium chloride  0.9 % 100 mL IVPB (0 g Intravenous Stopped 04/23/18 0234)  opium-belladonna (B&O SUPPRETTES) 16.2-60 MG suppository 1 suppository (1 suppository Rectal Given 04/23/18 0539)     Initial Impression / Assessment and Plan / ED Course  I have reviewed the triage vital signs and the nursing notes.  Pertinent labs & imaging results that were available during my care of the patient were reviewed by me and considered in my medical decision making (see chart for details).     Patient presents to the emergency department for evaluation of bilateral flank pain, lower abdominal pain and constipation.  Patient has a history of recently diagnosed pelvic mass that was compressing his ureters, had ureteral stents placed bilaterally.  He has noticed increased urinary frequency.  Urinalysis suspicious for infection.  No significant leukocytosis, afebrile.  No concern for sepsis at this time.  Patient administered IV Rocephin, culture pending.  CT scan performed to evaluate  for recurrent hydronephrosis, stent dislodgment.  He has the known mass seen, stents appear to be in place, kidneys are decompressed.  No evidence of obstruction.  He does have stool in the right colon but no significant distal colon or fecal impaction noted.  I suspect that a lot of his discomfort is coming from the mass.  The mass is displacing his rectum and likely causing some of his tenesmus.  He also is likely experiencing some bladder spasms secondary to the ureteral stents and the urinary infection.  He was administered a BNO suppository for this.  Patient is utilizing oxycodone for pain.  Continue stool softener for bowel regimen, MiraLAX as needed.  Final Clinical Impressions(s) / ED Diagnoses   Final diagnoses:  Pelvic mass  Urinary tract infection without hematuria, site unspecified  Bladder spasm    ED Discharge Orders         Ordered    cefUROXime (CEFTIN) 250 MG tablet  2 times daily with meals     04/23/18 0450    belladonna-opium (B&O SUPPRETTES) 16.2-30 MG suppository  Every 8 hours PRN     04/23/18 0450    polyethylene glycol (MIRALAX / GLYCOLAX) packet  2 times daily PRN     04/23/18 0450           Orpah Greek, MD 04/23/18 [phone]

## 2018-04-23 NOTE — Consult Note (Signed)
Urology Consult Note   Requesting Attending Physician:  Dorie Rank, MD Service Providing Consult: Urology  Consulting Attending: Alexis Frock, MD   Reason for Consult: Urinary retention  HPI: [facility] is seen in consultation for reasons noted above at the request of Dorie Rank, MD.  Patient is a 52 y.o. male admitted on 04/13/2018 with a diagnosis of a prostatic/pelvic mass with bilateral ureteral obstruction and hematuria.  He was taken to the OR on 04/13/18 and had TUR and TRUS biopsies of the prostate and bilateral ureteral stent insertion.  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  MRI of the abdomen and pelvis should no adenopathy or abdominal metastases, but CT Chest showed multiple pulmonary nodules consistent with metastatic disease. His Foley catheter was removed and he was discharged on 8/14 with plans to follow up with medical oncology for consideration of upfront chemotherapy, with the thought that he may benefit from cystoprostatectomy if favorable response. He is scheduled to see medical oncology tomorrow.  Patient returns to ED today with difficulty urinating and found to be in urinary retention with discomfort and bladder scans of ~500 mL. ED team with difficulty placing Foley. Urology requested for assistance.   Past Medical History: Past Medical History:  Diagnosis Date  . Hypertension   . MI (myocardial infarction) ([place])    2007    Past Surgical History:  Past Surgical History:  Procedure Laterality Date  . CYSTOSCOPY W/ URETERAL STENT PLACEMENT Bilateral 04/13/2018   Procedure: CYSTOSCOPY WITH BILATERAL STENT REPLACEMENT;  Surgeon: Irine Seal, MD;  Location: [facility];  Service: Urology;  Laterality: Bilateral;  . eye removed    . HERNIA REPAIR    . INTRAOCULAR PROSTHESES INSERTION    . PROSTATE BIOPSY [name] 04/13/2018   Procedure: ULTRASOUND GUIDED PROSTATE BIOPSY;  Surgeon: Irine Seal, MD;  Location: [facility];  Service: Urology;   Laterality: [name];  . TOOTH EXTRACTION Right 04/21/2013   Procedure: EXTRACTION MOLARS;  Surgeon: [name], DDS;  Location: [place];  Service: Oral Surgery;  Laterality: Right;  . TRANSURETHRAL RESECTION OF PROSTATE  04/13/2018   Procedure: TRANSURETHRAL RESECTION OF THE PROSTATE (TURP);  Surgeon: Irine Seal, MD;  Location[facility];  Service: Urology;;    Medication: Current Facility-Administered Medications  Medication Dose Route Frequency Provider Last Rate Last Dose  . lidocaine (XYLOCAINE) 2 % jelly 1 application  1 application Urethral Once Carmin Muskrat, MD       Current Outpatient Medications  Medication Sig Dispense Refill  . amLODipine (NORVASC) 10 MG tablet Take 1 tablet (10 mg total) by mouth daily. 30 tablet 1  . belladonna-opium (B&O SUPPRETTES) 16.2-30 MG suppository Place 1 suppository (30 mg total) rectally every 8 (eight) hours as needed for pain. 20 suppository 0  . cefUROXime (CEFTIN) 250 MG tablet Take 1 tablet (250 mg total) by mouth 2 (two) times daily with a meal. 20 tablet 0  . cetirizine (ZYRTEC) 10 MG tablet Take 10 mg by mouth daily as needed for allergies.    . fluticasone (FLONASE) 50 MCG/ACT nasal spray PLACE 2 SPRAYS INTO BOTH NOSTRILS DAILY. (Patient not taking: Reported on 04/13/2018) 16 g 9  . HYDROcodone-acetaminophen (NORCO/VICODIN) 5-325 MG tablet Take 1 tablet by mouth every 6 (six) hours as needed for moderate pain. 12 tablet 0  . hydrocortisone cream 1 % Apply to affected area 2 times daily (Patient not taking: Reported on 04/13/2018) 15 g 0  . hydrOXYzine (VISTARIL) 100 MG capsule TAKE  ONE CAPSULE BY MOUTH AT BEDTIME AS NEEDED FOR ALLERGY (Patient taking differently: Take 100 mg by mouth at bedtime as needed (allergies). ) 30 capsule 5  . Hyoscyamine Sulfate SL (LEVSIN/SL) 0.125 MG SUBL Place 0.125 mg under the tongue every 8 (eight) hours as needed. (Patient taking differently: Place 0.125 mg under the tongue every 8 (eight) hours as needed (cramping). )  120 each 0  . metoprolol tartrate (LOPRESSOR) 50 MG tablet Take 1 tablet (50 mg total) by mouth 2 (two) times daily. 60 tablet 1  . Multiple Vitamin (MULTIVITAMIN) tablet Take 1 tablet by mouth daily.    . phenazopyridine (PYRIDIUM) 200 MG tablet Take 1 tablet (200 mg total) by mouth 3 (three) times daily as needed (burning). (Patient not taking: Reported on 04/23/2018) 30 tablet 0  . polyethylene glycol (MIRALAX / GLYCOLAX) packet Take 17 g by mouth 2 (two) times daily as needed for mild constipation or moderate constipation. 14 each 0    Allergies: Allergies  Allergen Reactions  . Pollen Extract Other (See Comments)    Sneezing and shortness of breath    Social History: Social History   Tobacco Use  . Smoking status: Never Smoker  . Smokeless tobacco: Never Used  Substance Use Topics  . Alcohol use: No  . Drug use: No    Family History Family History  Problem Relation Age of Onset  . Hypertension Father   . Depression Brother     Review of Systems 10 systems were reviewed and are negative except as noted specifically in the HPI.  Objective   Vital signs in last 24 hours: BP 128/87   Pulse (!) 102   Temp 97.9 F (36.6 C) (Oral)   Resp 16   SpO2 93%   Physical Exam General: NAD, A&O, resting, appropriate HEENT: [place]/AT Pulmonary: Normal work of breathing Cardiovascular: HDS, adequate peripheral perfusion Abdomen: Soft, NTTP, nondistended GU: Foley catheter draining orange urine Extremities: warm and well perfused Neuro: Appropriate, no focal neurological deficits  Most Recent Labs: Lab Results  Component Value Date   WBC 11.5 (H) 04/23/2018   HGB 12.2 (L) 04/23/2018   HCT 37.5 (L) 04/23/2018   PLT 359 04/23/2018    Lab Results  Component Value Date   NA 141 04/23/2018   K 3.6 04/23/2018   CL 103 04/23/2018   CO2 29 04/23/2018   BUN 14 04/23/2018   CREATININE 1.28 (H) 04/23/2018   CALCIUM 9.5 04/23/2018   MG 2.0 04/17/2018    No results found  for: INR, APTT   IMAGING: Ct Renal Stone Study  Result Date: 04/23/2018 CLINICAL DATA:  Lower abdominal pain and constipation for 24 hours. Trans urethral prostate resection and stent on 04/19/2018. EXAM: CT ABDOMEN AND PELVIS WITHOUT CONTRAST TECHNIQUE: Multidetector CT imaging of the abdomen and pelvis was performed following the standard protocol without IV contrast. COMPARISON:  MRI pelvis 04/15/2018. CT abdomen and pelvis 04/12/2018. FINDINGS: Lower chest: Circumscribed noncalcified nodule in the left lower lung measuring 9 mm diameter. Hepatobiliary: Low-attenuation nodule in segment 5 of the liver measuring 13 mm diameter and in segment 6 measuring 6 mm diameter. These lesions are present on the previous study. There are too small to characterize by CT, but were shown to represent benign cysts at MRI. Gallbladder and bile ducts are unremarkable. Pancreas: Unremarkable. No pancreatic ductal dilatation or surrounding inflammatory changes. Spleen: Normal in size without focal abnormality. Adrenals/Urinary Tract: No adrenal gland nodules. Bilateral ureteral stents with proximal pigtails in the renal  pelvis ease and distal pigtails in the bladder. Mild residual ureteral dilatation. Stranding around the ureters. Bladder wall is not thickened and no bladder filling defects are identified. Stomach/Bowel: Stomach, small bowel, and colon are not abnormally distended. Stool fills the colon. No wall thickening is identified although under distention limits evaluation. Appendix is normal. There is a small periumbilical hernia containing fat with a small knuckle of small bowel. No proximal obstruction. Vascular/Lymphatic: No significant vascular findings are present. No enlarged abdominal or pelvic lymph nodes. Reproductive: There is a large pelvic mass likely arising from the prostate gland, measuring 8.9 x 10.2 cm in diameter. Other: No free air or free fluid in the abdomen. Musculoskeletal: Mild degenerative  changes in the spine. No destructive bone lesions. IMPRESSION: 1. Large pelvic mass likely arising from the prostate gland, measuring 8.9 x 10.2 cm in diameter. 2. Small periumbilical hernia containing fat with a small knuckle of small bowel. No proximal obstruction. 3. Low-attenuation lesions in segment 5 of the liver are too small to characterize by CT but were shown to represent benign cysts at MRI. 4. 9 mm nodule in the left lower lung. Non-contrast chest CT at 3-6 months is recommended. If the nodules are stable at time of repeat CT, then future CT at 18-24 months (from today's scan) is considered optional for low-risk patients, but is recommended for high-risk patients. This recommendation follows the consensus statement: Guidelines for Management of Incidental Pulmonary Nodules Detected on CT Images: From the Fleischner Society 2017; Radiology 2017; 284:228-243. 5. Bilateral ureteral stents. Stranding around the ureters may represent inflammatory change. Infection not excluded. Electronically Signed   By: Lucienne Capers M.D.   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 18Fr Council-tip catheter placed by Urology with assistance of flexible cystoscope and glidewire (procedure note below).   Recommendations: - OK to discharge from urologic perspective with Foley catheter in place - Patient to follow up with medical oncology tomorrow for further work up and consideration of systemic therapy. - He is scheduled for follow up with Urology in early September at which point he is interested in discussing the option of SP tube for management of his outlet obstruction.  Thank you for this consult. Please contact the urology consult pager with any further questions/concerns.  PROCEDURE NOTE:  DATE: 04/23/2018  INDICATIONS: See above  DESCRIPTION: Patient prepped and  draped in the usual sterile fashion. 17Fr flexible cystoscope inserted per urethra with copious lubrication and irrigation running. Anterior urethra normal. Posterior urethra with abutting/obstructing lateral lobes and high riding bladder neck. The cystoscope was successfully advanced into the bladder with visualization somewhat obscured from his discolored urine. A hydrophillic glidewire was passed through the cystoscope and visually confirmed to be within the bladder. The cystoscope was withdrawn over the wire. An 18Fr 2-way Council-tip catheter was then passed over the glidewire with immediate return of urine. 10 mL sterile water instilled into the balloon. Drainage bag connected and stat lock applied. Patient tolerated the procedure well.  PLAN: See above

## 2018-04-23 NOTE — ED Provider Notes (Signed)
Patient required consultation from our urology colleagues for placement of a Foley catheter prior to discharge.  Instructed to continue taking his antibiotics Patient has follow-up scheduled for tomorrow.   Carmin Muskrat, MD 04/23/18 [phone]

## 2018-04-23 NOTE — Discharge Instructions (Addendum)
Follow up with urology and oncology as planned, pick up your medications and take them as prescribed, return as needed for worsening symptoms  Continue to use the catheter until cleared by Dr. Jeffie Pollock

## 2018-04-23 NOTE — ED Triage Notes (Signed)
Patient arrives by [facility] complaining of lower abdominal pain and constipation x 24 hours. Patient with discharge 8/14 after a TURP and a stent and has been taking Norco for pain. Took stool softners today with no relief.

## 2018-04-23 NOTE — Discharge Instructions (Signed)
If you have worsening pain, cannot pass your urine or you develop fever, please return to the ER immediately.

## 2018-04-23 NOTE — ED Provider Notes (Addendum)
[facility] DEPT Provider Note   CSN: [id number] Arrival date & time: 04/23/18  1258     History   Chief Complaint Chief Complaint  Patient presents with  . Dysuria    HPI Dylan [facility] is a 52 y.o. male.  HPI Patient presented to the emergency room last evening for evaluation of abdominal pain flank pain urinary frequency and constipation.  Patient had laboratory tests and a CT scan.  Patient scan showed a large pelvic mass arising from the prostate current measuring 9 x 10 cm in diameter which was not a new finding for him.  Patient also had laboratory tests that were notable for urinalysis consistent with a urinary tract infection.  Patient was treated with IV antibiotics.  Patient was discharged with a prescription for antibiotics.  He was also prescribed stool softeners.  Patient states he return to the emergency room because he still having urinary discomfort.  He is not able to quite get comfortable but when he tries to sit and he feels like his mouth is dry.  He denies any fevers or chills.  No vomiting or diarrhea. Past Medical History:  Diagnosis Date  . Hypertension   . MI (myocardial infarction) [facility])    2007    Patient Active Problem List   Diagnosis Date Noted  . Bilateral ureteral obstruction 04/19/2018  . Malignant neoplasm metastatic to lung ([facility]) 04/19/2018  . Primary leiomyosarcoma of intra-abdominal site ([facility]) 04/18/2018  . Prostate neoplasm 04/13/2018  . Hypertension 12/26/2011  . 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 CYSTOSCOPY W/ URETERAL STENT PLACEMENT Bilateral 04/13/2018   Procedure: CYSTOSCOPY WITH BILATERAL STENT REPLACEMENT;  Surgeon: Irine Seal, MD;  [facility];  Service: Urology;  Laterality: Bilateral;  . eye removed    . HERNIA REPAIR    . INTRAOCULAR PROSTHESES INSERTION    . PROSTATE BIOPSY [name] 04/13/2018   Procedure: ULTRASOUND GUIDED  PROSTATE BIOPSY;  Surgeon: Irine Seal, MD;  [facility];  Service: Urology;  Laterality: [name];  . TOOTH EXTRACTION Right 04/21/2013   Procedure: EXTRACTION MOLARS;  Surgeon: [facility], DDS;  Location: [facility];  Service: Oral Surgery;  Laterality: Right;  . TRANSURETHRAL RESECTION OF PROSTATE  04/13/2018   Procedure: TRANSURETHRAL RESECTION OF THE PROSTATE (TURP);  Surgeon: Irine Seal, MD;  [facility];  Service: Urology;;        Home Medications    Prior to Admission medications   Medication Sig Start Date End Date Taking? Authorizing Provider  amLODipine (NORVASC) 10 MG tablet Take 1 tablet (10 mg total) by mouth daily. 04/19/18   Georgette Shell, MD  belladonna-opium (Gonzalez&O SUPPRETTES) 16.2-30 MG suppository Place 1 suppository (30 mg total) rectally every 8 (eight) hours as needed for pain. 04/23/18   Dylan Greek, MD  cefUROXime (CEFTIN) 250 MG tablet Take 1 tablet (250 mg total) by mouth 2 (two) times daily with a meal. 04/23/18   Pollina, Gwenyth Allegra, MD  cetirizine (ZYRTEC) 10 MG tablet Take 10 mg by mouth daily as needed for allergies.    [provider]  fluticasone (FLONASE) 50 MCG/ACT nasal spray PLACE 2 SPRAYS INTO BOTH NOSTRILS DAILY. Patient not taking: Reported on 04/13/2018 07/22/14   [facility], Dylan P, DO  HYDROcodone-acetaminophen (NORCO/VICODIN) 5-325 MG tablet Take 1 tablet by mouth every 6 (six) hours as needed for moderate pain. 04/13/18 04/13/19  Irine Seal, MD  hydrocortisone cream 1 %  Apply to affected area 2 times daily Patient not taking: Reported on 04/13/2018 08/05/17   Dylan Masker B, PA-C  hydrOXYzine (VISTARIL) 100 MG capsule TAKE ONE CAPSULE BY MOUTH AT BEDTIME AS NEEDED FOR ALLERGY Patient taking differently: Take 100 mg by mouth at bedtime as needed (allergies).  10/12/15   Dylan Haber, MD  Hyoscyamine Sulfate SL (LEVSIN/SL) 0.125 MG SUBL Place 0.125 mg under the tongue every 8 (eight) hours as needed. Patient taking differently:  Place 0.125 mg under the tongue every 8 (eight) hours as needed (cramping).  04/19/18   Georgette Shell, MD  metoprolol tartrate (LOPRESSOR) 50 MG tablet Take 1 tablet (50 mg total) by mouth 2 (two) times daily. 04/19/18   Georgette Shell, MD  Multiple Vitamin (MULTIVITAMIN) tablet Take 1 tablet by mouth daily.    [provider]  phenazopyridine (PYRIDIUM) 200 MG tablet Take 1 tablet (200 mg total) by mouth 3 (three) times daily as needed (burning). Patient not taking: Reported on 04/23/2018 04/19/18   Georgette Shell, MD  polyethylene glycol [facility]) packet Take 17 g by mouth 2 (two) times daily as needed for mild constipation or moderate constipation. 04/23/18   Dylan Greek, MD    Family History Family History  Problem Relation Age of Onset  . Hypertension Father   . Depression Brother     Social History Social History   Tobacco Use  . Smoking status: Never Smoker  . Smokeless tobacco: Never Used  Substance Use Topics  . Alcohol use: No  . Drug use: No     Allergies   Pollen extract   Review of Systems Review of Systems  All other systems reviewed and are negative.    Physical Exam Updated Vital Signs BP 128/87   Pulse (!) 102   Temp 97.9 F (36.6 C) (Oral)   Resp 16   SpO2 93%   Physical Exam  HENT:  Head: Normocephalic and atraumatic.  Right Ear: External ear normal.  Left Ear: External ear normal.  Eyes: Conjunctivae are normal. Right eye exhibits no discharge. Left eye exhibits no discharge. No scleral icterus.  Neck: Neck supple. No tracheal deviation present.  Cardiovascular: Normal rate, regular rhythm and intact distal pulses.  Pulmonary/Chest: Effort normal and breath sounds normal. No stridor. No respiratory distress. He has no wheezes. He has no rales.  Abdominal: Soft. Bowel sounds are normal. He exhibits no distension. There is tenderness in the suprapubic area. There is no rebound and no guarding.    Musculoskeletal: He exhibits no edema or tenderness.  Neurological: He is alert. He has normal strength. No cranial nerve deficit (no facial droop, extraocular movements intact, no slurred speech) or sensory deficit. He exhibits normal muscle tone. He displays no seizure activity. Coordination normal.  Skin: Skin is warm and dry. No rash noted. He is not diaphoretic.  Psychiatric: He has a normal mood and affect.  Nursing note and vitals reviewed.    ED Treatments / Results  Labs (all labs ordered are listed, but only abnormal results are displayed) Labs Reviewed  CBC - Abnormal; Notable for the following components:      Result Value   WBC 11.5 (*)    RBC 3.87 (*)    Hemoglobin 12.2 (*)    HCT 37.5 (*)    All other components within normal limits  BASIC METABOLIC PANEL - Abnormal; Notable for the following components:   Glucose, Bld 139 (*)    Creatinine, Ser 1.28 (*)  All other components within normal limits    EKG None  Radiology Ct Renal Stone Study  Result Date: 04/23/2018 CLINICAL DATA:  Lower abdominal pain and constipation for 24 hours. Trans urethral prostate resection and stent on 04/19/2018. EXAM: CT ABDOMEN AND PELVIS WITHOUT CONTRAST TECHNIQUE: Multidetector CT imaging of the abdomen and pelvis was performed following the standard protocol without IV contrast. COMPARISON:  MRI pelvis 04/15/2018. CT abdomen and pelvis 04/12/2018. FINDINGS: Lower chest: Circumscribed noncalcified nodule in the left lower lung measuring 9 mm diameter. Hepatobiliary: Low-attenuation nodule in segment 5 of the liver measuring 13 mm diameter and in segment 6 measuring 6 mm diameter. These lesions are present on the previous study. There are too small to characterize by CT, but were shown to represent benign cysts at MRI. Gallbladder and bile ducts are unremarkable. Pancreas: Unremarkable. No pancreatic ductal dilatation or surrounding inflammatory changes. Spleen: Normal in size without  focal abnormality. Adrenals/Urinary Tract: No adrenal gland nodules. Bilateral ureteral stents with proximal pigtails in the renal pelvis ease and distal pigtails in the bladder. Mild residual ureteral dilatation. Stranding around the ureters. Bladder wall is not thickened and no bladder filling defects are identified. Stomach/Bowel: Stomach, small bowel, and colon are not abnormally distended. Stool fills the colon. No wall thickening is identified although under distention limits evaluation. Appendix is normal. There is a small periumbilical hernia containing fat with a small knuckle of small bowel. No proximal obstruction. Vascular/Lymphatic: No significant vascular findings are present. No enlarged abdominal or pelvic lymph nodes. Reproductive: There is a large pelvic mass likely arising from the prostate gland, measuring 8.9 x 10.2 cm in diameter. Other: No free air or free fluid in the abdomen. Musculoskeletal: Mild degenerative changes in the spine. No destructive bone lesions. IMPRESSION: 1. Large pelvic mass likely arising from the prostate gland, measuring 8.9 x 10.2 cm in diameter. 2. Small periumbilical hernia containing fat with a small knuckle of small bowel. No proximal obstruction. 3. Low-attenuation lesions in segment 5 of the liver are too small to characterize by CT but were shown to represent benign cysts at MRI. 4. 9 mm nodule in the left lower lung. Non-contrast chest CT at 3-6 months is recommended. If the nodules are stable at time of repeat CT, then future CT at 18-24 months (from today's scan) is considered optional for low-risk patients, but is recommended for high-risk patients. This recommendation follows the consensus statement: Guidelines for Management of Incidental Pulmonary Nodules Detected on CT Images: From the Fleischner Society 2017; Radiology 2017; 284:228-243. 5. Bilateral ureteral stents. Stranding around the ureters may represent inflammatory change. Infection not excluded.  Electronically Signed   By: Lucienne Capers M.D.   On: 04/23/2018 03:22    Procedures Procedures (including critical care time)  Medications Ordered in ED Medications  sodium chloride 0.9 % bolus 1,000 mL ( Intravenous Stopped 04/23/18 1603)  morphine 4 MG/ML injection 4 mg (4 mg Intravenous Given 04/23/18 1606)     Initial Impression / Assessment and Plan / ED Course  I have reviewed the triage vital signs and the nursing notes.  Pertinent labs & imaging results that were available during my care of the patient were reviewed by me and considered in my medical decision making (see chart for details).  Clinical Course as of Apr 24 1627  Nancy Fetter Apr 23, 2018  1618 Bladder scan 524 cc of urine after patient urinated   [JK]    Clinical Course User Index [JK] Dorie Rank, MD  Patient presented to the emergency room for persistent urinary discomfort after being in the hospital last evening.  Patient unfortunately has a complex medical history which includes a known malignant pelvic mass.  Pt had a CT scan that showed the persistent mass in the pelvic region.  Pt left but then return to the ED.  Labs are unchanged.  Bladder scan does show urinary retention which is likely contributing to his discomfort.  Will place a foley catheter.  Will have him follow up with urology  Final Clinical Impressions(s) / ED Diagnoses   Final diagnoses:  Urinary tract infection with hematuria, site unspecified  Pelvic mass  Bladder spasms  Urinary retention    ED Discharge Orders    None          Dorie Rank, MD 04/23/18 [phone]

## 2018-04-24 ENCOUNTER — Other Ambulatory Visit: Payer: Self-pay

## 2018-04-24 ENCOUNTER — Inpatient Hospital Stay: Payer: BLUE CROSS/BLUE SHIELD | Attending: Oncology | Admitting: Oncology

## 2018-04-24 ENCOUNTER — Telehealth: Payer: Self-pay | Admitting: Oncology

## 2018-04-24 ENCOUNTER — Encounter: Payer: Self-pay | Admitting: Oncology

## 2018-04-24 ENCOUNTER — Other Ambulatory Visit: Payer: Self-pay | Admitting: Oncology

## 2018-04-24 VITALS — BP 129/87 | HR 113 | Temp 98.3°F | Resp 18 | Ht 71.0 in | Wt 218.7 lb

## 2018-04-24 DIAGNOSIS — R918 Other nonspecific abnormal finding of lung field: Secondary | ICD-10-CM | POA: Diagnosis not present

## 2018-04-24 DIAGNOSIS — R52 Pain, unspecified: Secondary | ICD-10-CM

## 2018-04-24 DIAGNOSIS — I252 Old myocardial infarction: Secondary | ICD-10-CM | POA: Diagnosis not present

## 2018-04-24 DIAGNOSIS — C61 Malignant neoplasm of prostate: Secondary | ICD-10-CM

## 2018-04-24 DIAGNOSIS — I10 Essential (primary) hypertension: Secondary | ICD-10-CM

## 2018-04-24 DIAGNOSIS — R339 Retention of urine, unspecified: Secondary | ICD-10-CM

## 2018-04-24 DIAGNOSIS — C7800 Secondary malignant neoplasm of unspecified lung: Secondary | ICD-10-CM

## 2018-04-24 DIAGNOSIS — C494 Malignant neoplasm of connective and soft tissue of abdomen: Secondary | ICD-10-CM

## 2018-04-24 MED ORDER — HYDROCODONE-ACETAMINOPHEN 5-325 MG PO TABS: 1.0000 | ORAL_TABLET | Freq: Four times a day (QID) | ORAL | 0 refills | Status: DC | PRN

## 2018-04-24 NOTE — Telephone Encounter (Signed)
Appts scheduled AVS/Calendar printed per 8/19 los

## 2018-04-24 NOTE — Progress Notes (Signed)
Reason for the request:    Prostate sarcoma  HPI: I was asked by Dr. Jeffie Pollock to evaluate [name] for new diagnosis of pelvic mass.  He is a 52 year old man with history of hypertension who presented with hematuria for urological evaluation by Dr. Jeffie Pollock.  This was noted in June 2019 and was referred by his primary care physician to Dr. Jeffie Pollock for further evaluation.  At that time MRI of the abdomen obtained on 04/15/2018 showed an 8.9 cm mass arising from the prostate.  This on these findings, he underwent a transrectal prostate ultrasound and a prostate biopsy as well as a cystoscopy with insertion of double-J stent and transurethral resection of the prostate completed by Dr. Jeffie Pollock on April 13, 2018.  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  Immunohistochemical stains showed positive for desmin, smooth muscle actin, muscle specific actin and negative for PSA.  He was also negative for cytokeratin 7, 903, CD117 and CD34.  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  CT scan of the chest showed small pulmonary nodules that are suspicious for pulmonary metastasis.  Clinically, he has issues with urination and developed urinary retention and required emergency department visit and a Foley catheter insertion at the time.  He does take Norco for pelvic discomfort and he does take this medication few times a day for pain relief.  His appetite has been reasonable although of lost some weight.  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  He does not report any headaches, blurry vision, syncope or seizures. Does not report any fevers, chills or sweats.  Does not report any cough, wheezing or hemoptysis.  Does not report any chest pain, palpitation, orthopnea or leg edema.  Does not report any nausea, vomiting or abdominal pain.  Does not report any  constipation or diarrhea.  Does not report any skeletal complaints.    Does not report frequency, urgency or hematuria.  Does not report any skin rashes or lesions. Does not report any heat or cold intolerance.  Does not report any lymphadenopathy or petechiae.  Does not report any anxiety or depression.  Remaining review of systems is negative.    Past Medical History:  Diagnosis Date  . Hypertension   . MI (myocardial infarction) ([facility])    2007  :  Past Surgical History:  Procedure Laterality Date  . CYSTOSCOPY W/ URETERAL STENT PLACEMENT Bilateral 04/13/2018   Procedure: CYSTOSCOPY WITH BILATERAL STENT REPLACEMENT;  Surgeon: Irine Seal, MD;  Location: [facility];  Service: Urology;  Laterality: Bilateral;  . eye removed    . HERNIA REPAIR    . INTRAOCULAR PROSTHESES INSERTION    . PROSTATE BIOPSY [name] 04/13/2018   Procedure: ULTRASOUND GUIDED PROSTATE BIOPSY;  Surgeon: Irine Seal, MD;  Location: [facility];  Service: Urology;  Laterality: [name];  . TOOTH EXTRACTION Right 04/21/2013   Procedure: EXTRACTION MOLARS;  Surgeon: [name], DDS;  Location: [facility];  Service: Oral Surgery;  Laterality: Right;  . TRANSURETHRAL RESECTION OF PROSTATE  04/13/2018   Procedure: TRANSURETHRAL RESECTION OF THE PROSTATE (TURP);  Surgeon: Irine Seal, MD;  Location: [facility];  Service: Urology;;  :   Current Outpatient Medications:  .  amLODipine (NORVASC) 10 MG tablet, Take 1 tablet (10 mg total) by mouth daily., Disp: 30 tablet, Rfl: 1 .  belladonna-opium (B&O SUPPRETTES) 16.2-30 MG suppository, Place  1 suppository (30 mg total) rectally every 8 (eight) hours as needed for pain., Disp: 20 suppository, Rfl: 0 .  cefUROXime (CEFTIN) 250 MG tablet, Take 1 tablet (250 mg total) by mouth 2 (two) times daily with a meal., Disp: 20 tablet, Rfl: 0 .  cetirizine (ZYRTEC) 10 MG tablet, Take 10 mg by mouth daily as needed for allergies., Disp: , Rfl:  .  fluticasone (FLONASE) 50 MCG/ACT nasal spray, PLACE 2 SPRAYS INTO BOTH  NOSTRILS DAILY. (Patient not taking: Reported on 04/13/2018), Disp: 16 g, Rfl: 9 .  HYDROcodone-acetaminophen (NORCO/VICODIN) 5-325 MG tablet, Take 1 tablet by mouth every 6 (six) hours as needed for moderate pain., Disp: 12 tablet, Rfl: 0 .  hydrocortisone cream 1 %, Apply to affected area 2 times daily (Patient not taking: Reported on 04/13/2018), Disp: 15 g, Rfl: 0 .  hydrOXYzine (VISTARIL) 100 MG capsule, TAKE ONE CAPSULE BY MOUTH AT BEDTIME AS NEEDED FOR ALLERGY (Patient taking differently: Take 100 mg by mouth at bedtime as needed (allergies). ), Disp: 30 capsule, Rfl: 5 .  Hyoscyamine Sulfate SL (LEVSIN/SL) 0.125 MG SUBL, Place 0.125 mg under the tongue every 8 (eight) hours as needed. (Patient taking differently: Place 0.125 mg under the tongue every 8 (eight) hours as needed (cramping). ), Disp: 120 each, Rfl: 0 .  metoprolol tartrate (LOPRESSOR) 50 MG tablet, Take 1 tablet (50 mg total) by mouth 2 (two) times daily., Disp: 60 tablet, Rfl: 1 .  Multiple Vitamin (MULTIVITAMIN) tablet, Take 1 tablet by mouth daily., Disp: , Rfl:  .  phenazopyridine (PYRIDIUM) 200 MG tablet, Take 1 tablet (200 mg total) by mouth 3 (three) times daily as needed (burning). (Patient not taking: Reported on 04/23/2018), Disp: 30 tablet, Rfl: 0 .  polyethylene glycol (MIRALAX / GLYCOLAX) packet, Take 17 g by mouth 2 (two) times daily as needed for mild constipation or moderate constipation., Disp: 14 each, Rfl: 0:  Allergies  Allergen Reactions  . Pollen Extract Other (See Comments)    Sneezing and shortness of breath  :  Family History  Problem Relation Age of Onset  . Hypertension Father   . Depression Brother   :  Social History   Socioeconomic History  . Marital status: Married    Spouse name: Not on file  . Number of children: Not on file  . Years of education: Not on file  . Highest education level: Not on file  Occupational History  . Not on file  Social Needs  . Financial resource strain: Not on  file  . Food insecurity:    Worry: Not on file    Inability: Not on file  . Transportation needs:    Medical: Not on file    Non-medical: Not on file  Tobacco Use  . Smoking status: Never Smoker  . Smokeless tobacco: Never Used  Substance and Sexual Activity  . Alcohol use: No  . Drug use: No  . Sexual activity: Not Currently  Lifestyle  . Physical activity:    Days per week: Not on file    Minutes per session: Not on file  . Stress: Not on file  Relationships  . Social connections:    Talks on phone: Not on file    Gets together: Not on file    Attends religious service: Not on file    Active member of club or organization: Not on file    Attends meetings of clubs or organizations: Not on file    Relationship status: Not on  file  . Intimate partner violence:    Fear of current or ex partner: Not on file    Emotionally abused: Not on file    Physically abused: Not on file    Forced sexual activity: Not on file  Other Topics Concern  . Not on file  Social History Narrative  . Not on file  :  Pertinent items are noted in HPI.  Exam: Blood pressure 129/87, pulse (!) 113, temperature 98.3 F (36.8 C), temperature source Oral, resp. rate 18, height _0  (1.803 m), weight 218 lb 11.2 oz (99.2 kg), SpO2 98 %.  ECOG 0 General appearance: alert and cooperative appeared without distress. Head: atraumatic without any abnormalities. Eyes: conjunctivae/corneas clear. PERRL.  Sclera anicteric. Throat: lips, mucosa, and tongue normal; without oral thrush or ulcers. Resp: clear to auscultation bilaterally without rhonchi, wheezes or dullness to percussion. Cardio: regular rate and rhythm, S1, S2 normal, no murmur, click, rub or gallop GI: soft, non-tender; bowel sounds normal; no masses,  no organomegaly GU exam: Foley catheter in place. Skin: Skin color, texture, turgor normal. No rashes or lesions Lymph nodes: Cervical, supraclavicular, and axillary nodes  normal. Neurologic: Grossly normal without any motor, sensory or deep tendon reflexes. Musculoskeletal: No joint deformity or effusion.  Recent Labs    04/23/18 0146 04/23/18 1503  WBC 10.9* 11.5*  HGB 11.5* 12.2*  HCT 35.2* 37.5*  PLT 305 359   Recent Labs    04/23/18 0146 04/23/18 1503  NA 138 141  K 3.8 3.6  CL 102 103  CO2 26 29  GLUCOSE 118* 139*  BUN 16 14  CREATININE 1.26* 1.28*  CALCIUM 9.3 9.5     Ct Chest Wo Contrast  Result Date: 04/18/2018 CLINICAL DATA:  Trans urethral resection of the prostate with cystoscopy and bilateral ureteral stent placement for prostate leiomyosarcoma on 04/13/2018. EXAM: CT CHEST WITHOUT CONTRAST TECHNIQUE: Multidetector CT imaging of the chest was performed following the standard protocol without IV contrast. COMPARISON:  Chest radiographs 02/22/2017. Abdominopelvic CT 04/12/2018. FINDINGS: Cardiovascular: Mild aortic atherosclerosis. No acute vascular findings on noncontrast imaging. The heart size is normal. There is no pericardial effusion. Mediastinum/Nodes: There are no enlarged mediastinal, hilar or axillary lymph nodes.Hilar assessment is limited by the lack of intravenous contrast. The thyroid gland, trachea and esophagus demonstrate no significant findings. Lungs/Pleura: There is no pleural effusion. There are bilateral noncalcified pulmonary nodules, suspicious for metastatic disease. As seen on prior abdominal CT, there is an 8 x 9 mm nodule anteriorly in the left lower lobe on image 107/7. There is a 3 mm left upper lobe nodule on image 57/7. There is a 6 x 6 mm right upper lobe nodule on image 37/7 and a right lower lobe nodule measuring 7 mm on image 74/7. Upper abdomen: Stable 14 mm low-density lesion anteriorly in the liver (image 144/2). No acute findings seen within the visualized upper abdomen. Musculoskeletal/Chest wall: There is no chest wall mass or suspicious osseous finding. Small cervical ribs bilaterally. IMPRESSION: 1.  Bilateral pulmonary nodules suspicious for metastatic disease in light of prostatic leiomyosarcoma. 2. No evidence of mediastinal adenopathy or acute thoracic findings. 3. Mild Aortic Atherosclerosis (ICD10-I70.0). Electronically Signed   By: Richardean Sale M.D.   On: 04/18/2018 15:20   Mr Pelvis W Wo Contrast  Result Date: 04/15/2018 CLINICAL DATA:  Large prostate mass, liver lesions on CT EXAM: MRI ABDOMEN AND PELVIS WITHOUT AND WITH CONTRAST TECHNIQUE: Multiplanar multisequence MR imaging of the abdomen and pelvis was  performed both before and after the administration of intravenous contrast. CONTRAST:  26m MULTIHANCE GADOBENATE DIMEGLUMINE 529 MG/ML IV SOLN COMPARISON:  CT abdomen/pelvis dated 04/12/2008 FINDINGS: Lower chest: Lung bases are essentially clear. The 7 mm subpleural nodule on recent CT is not conspicuous on MRI. Hepatobiliary: 14 mm bilobed cyst in the anterior right hepatic lobe (series 12/image 21), without enhancement, benign. Two additional sub 5 mm cysts in the lateral segment left hepatic lobe (series 12/images 18 and 20). No suspicious/enhancing hepatic lesions. Gallbladder is unremarkable. No intrahepatic or extrahepatic ductal dilatation. Pancreas:  Within normal limits. Spleen:  Within normal limits. Adrenals/Urinary Tract:  Adrenal glands are within normal limits. Kidneys are within normal limits.  No hydronephrosis. Bladder is mildly thick-walled although underdistended. Stomach/Bowel: Stomach is within normal limits. Visualized bowel is unremarkable. No evidence of bowel obstruction. Normal appendix (series 5/image 35). Vascular/Lymphatic:  No evidence of aneurysm. No suspicious abdominopelvic lymphadenopathy. Reproductive: Marked enlargement of the prostate. While the peripheral zone appears normal at the prostate apex (series 5/image 11), the central gland and right posterior peripheral zone begins to appear abnormal at the level of the mid gland (series 5/image 12), with  associated 9.8 x 8.9 x 9.4 cm mass arising exophytically superiorly from the gland (series 5/image 19; series 4/image 13). The mass is favored to be of prostate origin rather than a secondary metastasis to the prostate based on the imaging appearance. In the setting of a normal PSA, primary differential considerations include prostate neuroendocrine carcinoma versus prostate sarcoma/sarcomatoid tumor. Other:  No abdominopelvic ascites. Musculoskeletal: No focal osseous lesions. IMPRESSION: 9.8 cm mass arising exophytically superiorly from the gland, favored to be a prostate origin rather than a secondary metastasis to the prostate. In the setting of a normal PSA, primary differential considerations include prostate neuroendocrine carcinoma versus prostate sarcoma/sarcomatoid tumor. Correlate with pending biopsy results. Benign hepatic cysts, including a dominant 14 mm cyst in the anterior right hepatic lobe. No suspicious/enhancing hepatic lesions. No evidence of metastatic disease in the abdomen/pelvis. The 7 mm subpleural nodule on recent CT is not evident on MRI. Assuming that this patient's suspected malignancy is confirmed, please note that Fleischner Society guidelines do not apply. Dedicated CT chest may be prudent for complete chest assessment/staging following confirmed tissue diagnosis. Electronically Signed   By: SJulian HyM.D.   On: 04/15/2018 15:50   Mr Abdomen W Wo Contrast  Result Date: 04/15/2018 CLINICAL DATA:  Large prostate mass, liver lesions on CT EXAM: MRI ABDOMEN AND PELVIS WITHOUT AND WITH CONTRAST TECHNIQUE: Multiplanar multisequence MR imaging of the abdomen and pelvis was performed both before and after the administration of intravenous contrast. CONTRAST:  24mMULTIHANCE GADOBENATE DIMEGLUMINE 529 MG/ML IV SOLN COMPARISON:  CT abdomen/pelvis dated 04/12/2008 FINDINGS: Lower chest: Lung bases are essentially clear. The 7 mm subpleural nodule on recent CT is not conspicuous on  MRI. Hepatobiliary: 14 mm bilobed cyst in the anterior right hepatic lobe (series 12/image 21), without enhancement, benign. Two additional sub 5 mm cysts in the lateral segment left hepatic lobe (series 12/images 18 and 20). No suspicious/enhancing hepatic lesions. Gallbladder is unremarkable. No intrahepatic or extrahepatic ductal dilatation. Pancreas:  Within normal limits. Spleen:  Within normal limits. Adrenals/Urinary Tract:  Adrenal glands are within normal limits. Kidneys are within normal limits.  No hydronephrosis. Bladder is mildly thick-walled although underdistended. Stomach/Bowel: Stomach is within normal limits. Visualized bowel is unremarkable. No evidence of bowel obstruction. Normal appendix (series 5/image 35). Vascular/Lymphatic:  No evidence of aneurysm. No  suspicious abdominopelvic lymphadenopathy. Reproductive: Marked enlargement of the prostate. While the peripheral zone appears normal at the prostate apex (series 5/image 11), the central gland and right posterior peripheral zone begins to appear abnormal at the level of the mid gland (series 5/image 12), with associated 9.8 x 8.9 x 9.4 cm mass arising exophytically superiorly from the gland (series 5/image 19; series 4/image 13). The mass is favored to be of prostate origin rather than a secondary metastasis to the prostate based on the imaging appearance. In the setting of a normal PSA, primary differential considerations include prostate neuroendocrine carcinoma versus prostate sarcoma/sarcomatoid tumor. Other:  No abdominopelvic ascites. Musculoskeletal: No focal osseous lesions. IMPRESSION: 9.8 cm mass arising exophytically superiorly from the gland, favored to be a prostate origin rather than a secondary metastasis to the prostate. In the setting of a normal PSA, primary differential considerations include prostate neuroendocrine carcinoma versus prostate sarcoma/sarcomatoid tumor. Correlate with pending biopsy results. Benign hepatic  cysts, including a dominant 14 mm cyst in the anterior right hepatic lobe. No suspicious/enhancing hepatic lesions. No evidence of metastatic disease in the abdomen/pelvis. The 7 mm subpleural nodule on recent CT is not evident on MRI. Assuming that this patient's suspected malignancy is confirmed, please note that Fleischner Society guidelines do not apply. Dedicated CT chest may be prudent for complete chest assessment/staging following confirmed tissue diagnosis. Electronically Signed   By: Julian Hy M.D.   On: 04/15/2018 15:50    Ct Renal Stone Study  Result Date: 04/23/2018 CLINICAL DATA:  Lower abdominal pain and constipation for 24 hours. Trans urethral prostate resection and stent on 04/19/2018. EXAM: CT ABDOMEN AND PELVIS WITHOUT CONTRAST TECHNIQUE: Multidetector CT imaging of the abdomen and pelvis was performed following the standard protocol without IV contrast. COMPARISON:  MRI pelvis 04/15/2018. CT abdomen and pelvis 04/12/2018. FINDINGS: Lower chest: Circumscribed noncalcified nodule in the left lower lung measuring 9 mm diameter. Hepatobiliary: Low-attenuation nodule in segment 5 of the liver measuring 13 mm diameter and in segment 6 measuring 6 mm diameter. These lesions are present on the previous study. There are too small to characterize by CT, but were shown to represent benign cysts at MRI. Gallbladder and bile ducts are unremarkable. Pancreas: Unremarkable. No pancreatic ductal dilatation or surrounding inflammatory changes. Spleen: Normal in size without focal abnormality. Adrenals/Urinary Tract: No adrenal gland nodules. Bilateral ureteral stents with proximal pigtails in the renal pelvis ease and distal pigtails in the bladder. Mild residual ureteral dilatation. Stranding around the ureters. Bladder wall is not thickened and no bladder filling defects are identified. Stomach/Bowel: Stomach, small bowel, and colon are not abnormally distended. Stool fills the colon. No wall  thickening is identified although under distention limits evaluation. Appendix is normal. There is a small periumbilical hernia containing fat with a small knuckle of small bowel. No proximal obstruction. Vascular/Lymphatic: No significant vascular findings are present. No enlarged abdominal or pelvic lymph nodes. Reproductive: There is a large pelvic mass likely arising from the prostate gland, measuring 8.9 x 10.2 cm in diameter. Other: No free air or free fluid in the abdomen. Musculoskeletal: Mild degenerative changes in the spine. No destructive bone lesions. IMPRESSION: 1. Large pelvic mass likely arising from the prostate gland, measuring 8.9 x 10.2 cm in diameter. 2. Small periumbilical hernia containing fat with a small knuckle of small bowel. No proximal obstruction. 3. Low-attenuation lesions in segment 5 of the liver are too small to characterize by CT but were shown to represent benign cysts at MRI.  4. 9 mm nodule in the left lower lung. Non-contrast chest CT at 3-6 months is recommended. If the nodules are stable at time of repeat CT, then future CT at 18-24 months (from today's scan) is considered optional for low-risk patients, but is recommended for high-risk patients. This recommendation follows the consensus statement: Guidelines for Management of Incidental Pulmonary Nodules Detected on CT Images: From the Fleischner Society 2017; Radiology 2017; 284:228-243. 5. Bilateral ureteral stents. Stranding around the ureters may represent inflammatory change. Infection not excluded. Electronically Signed   By: Lucienne Capers M.D.   On: 04/23/2018 03:22    Assessment and Plan:   52 year old man with the following:  1.  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  He presented with a large pelvic mass measuring 8.9 x 10.2 cm causing urinary retention.  He is status post prostate biopsy as well as cystoscopy and double-J stent placement.  The natural course of  this disease was discussed today with the patient and his family that accompanied him today.  I also spoke with his wife over the phone.  These findings are presented rather rare tumor of the prostate presenting with locally advanced disease and potentially lung metastasis.  Treatment modalities would include primary surgical therapy, chemotherapy and radiation are all 3 of them were discussed today in detail.  Given the size of the tumor, I anticipate that he will require neoadjuvant treatment with chemotherapy plus or minus radiation and attempt to shrink his tumor to give him an outside chance of this being resectable.  I think this is a rather unlikely scenario but that would be his best chance of long-term disease control.  Palliative chemotherapy alone would also be an option which is realistically will be the best course of action.  Given the rarity of this tumor and his young age.  I have recommended a consultation with Dr. Angelina Ok at [facility] for evaluation and recommendation of best treatment approach.  He is agreeable to have this done at this time.  I am more than happy to administer his chemotherapy locally if he wishes to do so.  2.  Urinary retention: Foley catheter in place and I recommended follow-up with urology regarding this issue.  3.  Pain: Prescription for Norco was refilled for him today.  4.  Prognosis: This was discussed today in details with the patient and his family.  I fear the prognosis from this tumor is rather poor despite his young age and performance status is excellent the outcome is likely is not curative.  5.  Follow-up: We will be in the next few weeks to follow his progress.  60  minutes was spent with the patient face-to-face today.  More than 50% of time was dedicated to discussing the natural course of this disease, reviewing imaging studies and discussing the plan of care with the patient, his wife and family members.   Thank you for the  referral.  A copy of this consult has been forwarded to the requesting physician.

## 2018-04-25 ENCOUNTER — Emergency Department (HOSPITAL_COMMUNITY)
Admission: EM | Admit: 2018-04-25 | Discharge: 2018-04-25 | Disposition: A | Discharge status: Home / Self Care | Payer: BLUE CROSS/BLUE SHIELD | Attending: Emergency Medicine | Admitting: Emergency Medicine

## 2018-04-25 ENCOUNTER — Other Ambulatory Visit: Payer: Self-pay

## 2018-04-25 ENCOUNTER — Encounter (HOSPITAL_COMMUNITY): Payer: Self-pay

## 2018-04-25 DIAGNOSIS — Y731 Therapeutic (nonsurgical) and rehabilitative gastroenterology and urology devices associated with adverse incidents: Secondary | ICD-10-CM | POA: Diagnosis not present

## 2018-04-25 DIAGNOSIS — T83098A Other mechanical complication of other indwelling urethral catheter, initial encounter: Secondary | ICD-10-CM | POA: Diagnosis not present

## 2018-04-25 DIAGNOSIS — I10 Essential (primary) hypertension: Secondary | ICD-10-CM | POA: Diagnosis not present

## 2018-04-25 DIAGNOSIS — T839XXA Unspecified complication of genitourinary prosthetic device, implant and graft, initial encounter: Secondary | ICD-10-CM | POA: Insufficient documentation

## 2018-04-25 MED ORDER — LIDOCAINE HCL URETHRAL/MUCOSAL 2 % EX GEL
1.0000 "application " | Freq: Once | CUTANEOUS | Status: AC
  Administered 2018-04-25: 1 via URETHRAL
  Filled 2018-04-25: qty 5

## 2018-04-25 NOTE — ED Notes (Signed)
Pt was shown how to change the drainage bag to a leg bag. Will follow up with urology

## 2018-04-25 NOTE — ED Provider Notes (Signed)
[facility] DEPT Provider Note   CSN: [id number] Arrival date & time: 04/25/18  1128     History   Chief Complaint Chief Complaint  Patient presents with  . foley cath issues    HPI Dylan Gonzalez is a 52 y.o. male.  Who presents emergency department with chief complaint of Foley catheter issues.  Patient was recently diagnosed with stage IV prostate cancer and has a Foley catheter in place secondary to urinary retention from bladder outlet obstruction due to mass.  The patient states that tripped on the Foley catheter which tunneled at his penis.  He complains that he is having some bleeding and needs something to secure the catheter back to his leg.  He was also discharged with a leg bag and would like to have one.  He denies any discharge, fevers, chills, flank pain.  He was not having any bleeding or significant pain prior to tugging at the catheter.  HPI  Past Medical History:  Diagnosis Date  . Hypertension   . MI (myocardial infarction) [facility])    2007    Patient Active Problem List   Diagnosis Date Noted  . Bilateral ureteral obstruction 04/19/2018  . Malignant neoplasm metastatic to lung ([place]) 04/19/2018  . 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 Prostate neoplasm 04/13/2018  . Hypertension 12/26/2011  . 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 CYSTOSCOPY W/ URETERAL STENT PLACEMENT Bilateral 04/13/2018   Procedure: CYSTOSCOPY WITH BILATERAL STENT REPLACEMENT;  Surgeon: Irine Seal, MD;  Location: [facility];  Service: Urology;  Laterality: Bilateral;  . eye removed    . HERNIA REPAIR    . INTRAOCULAR PROSTHESES INSERTION    . PROSTATE BIOPSY [name] 04/13/2018   Procedure: ULTRASOUND GUIDED PROSTATE BIOPSY;  Surgeon: Irine Seal, MD;  Location: [facility];  Service: Urology;  Laterality: [name];  . TOOTH EXTRACTION Right 04/21/2013   Procedure: EXTRACTION MOLARS;   Surgeon: [name], DDS;  Location: [facility];  Service: Oral Surgery;  Laterality: Right;  . TRANSURETHRAL RESECTION OF PROSTATE  04/13/2018   Procedure: TRANSURETHRAL RESECTION OF THE PROSTATE (TURP);  Surgeon: Irine Seal, MD;  Location: [facility];  Service: Urology;;        Home Medications    Prior to Admission medications   Medication Sig Start Date End Date Taking? Authorizing Provider  amLODipine (NORVASC) 10 MG tablet Take 1 tablet (10 mg total) by mouth daily. 04/19/18   Georgette Shell, MD  belladonna-opium (B&O SUPPRETTES) 16.2-30 MG suppository Place 1 suppository (30 mg total) rectally every 8 (eight) hours as needed for pain. 04/23/18   Orpah Greek, MD  cefUROXime (CEFTIN) 250 MG tablet Take 1 tablet (250 mg total) by mouth 2 (two) times daily with a meal. 04/23/18   Pollina, Gwenyth Allegra, MD  cetirizine (ZYRTEC) 10 MG tablet Take 10 mg by mouth daily as needed for allergies.    [provider]  fluticasone (FLONASE) 50 MCG/ACT nasal spray PLACE 2 SPRAYS INTO BOTH NOSTRILS DAILY. Patient not taking: Reported on 04/13/2018 07/22/14   Le, Thao P, DO  HYDROcodone-acetaminophen (NORCO/VICODIN) 5-325 MG tablet Take 1 tablet by mouth every 6 (six) hours as needed for moderate pain. 04/24/18 04/24/19  [name], MD  hydrocortisone cream 1 % Apply to affected area 2 times daily Patient not taking: Reported on 04/13/2018 08/05/17   Langston Masker B, PA-C  hydrOXYzine (VISTARIL) 100 MG capsule TAKE  ONE CAPSULE BY MOUTH AT BEDTIME AS NEEDED FOR ALLERGY Patient taking differently: Take 100 mg by mouth at bedtime as needed (allergies).  10/12/15   Robyn Haber, MD  Hyoscyamine Sulfate SL (LEVSIN/SL) 0.125 MG SUBL Place 0.125 mg under the tongue every 8 (eight) hours as needed. Patient taking differently: Place 0.125 mg under the tongue every 8 (eight) hours as needed (cramping).  04/19/18   Georgette Shell, MD  metoprolol tartrate (LOPRESSOR) 50 MG tablet Take 1  tablet (50 mg total) by mouth 2 (two) times daily. 04/19/18   Georgette Shell, MD  Multiple Vitamin (MULTIVITAMIN) tablet Take 1 tablet by mouth daily.    [provider]  phenazopyridine (PYRIDIUM) 200 MG tablet Take 1 tablet (200 mg total) by mouth 3 (three) times daily as needed (burning). Patient not taking: Reported on 04/23/2018 04/19/18   Georgette Shell, MD  polyethylene glycol [facility] Floria Raveling) packet Take 17 g by mouth 2 (two) times daily as needed for mild constipation or moderate constipation. 04/23/18   Orpah Greek, MD    Family History Family History  Problem Relation Age of Onset  . Hypertension Father   . Depression Brother     Social History Social History   Tobacco Use  . Smoking status: Never Smoker  . Smokeless tobacco: Never Used  Substance Use Topics  . Alcohol use: No  . Drug use: No     Allergies   Pollen extract   Review of Systems Review of Systems Ten systems reviewed and are negative for acute change, except as noted in the HPI.    Physical Exam Updated Vital Signs BP (!) 134/93 (BP Location: Right Arm)   Pulse 91   Temp 98.4 F (36.9 C) (Oral)   Resp 18   Ht 5\' 11"  (1.803 m)   Wt 98.9 kg   SpO2 97%   BMI 30.40 kg/m   Physical Exam  Constitutional: He appears well-developed and well-nourished. No distress.  HENT:  Head: Normocephalic and atraumatic.  Eyes: Conjunctivae are normal. No scleral icterus.  Neck: Normal range of motion. Neck supple.  Cardiovascular: Normal rate, regular rhythm and normal heart sounds.  Pulmonary/Chest: Effort normal and breath sounds normal. No respiratory distress.  Abdominal: Soft. There is no tenderness.  Genitourinary:  Genitourinary Comments: Normal male anatomy, circumcised.  There is a Foley catheter exiting the urethral meatus without any overt blood.  Minimal tenderness at the urethral meatus without discharge.  Urine is blood-tinged in the bag  Musculoskeletal: He  exhibits no edema.  Neurological: He is alert.  Skin: Skin is warm and dry. He is not diaphoretic.  Psychiatric: His behavior is normal.  Nursing note and vitals reviewed.    ED Treatments / Results  Labs (all labs ordered are listed, but only abnormal results are displayed) Labs Reviewed - No data to display  EKG None  Radiology No results found.  Procedures Procedures (including critical care time)  Medications Ordered in ED Medications - No data to display   Initial Impression / Assessment and Plan / ED Course  I have reviewed the triage vital signs and the nursing notes.  Pertinent labs & imaging results that were available during my care of the patient were reviewed by me and considered in my medical decision making (see chart for details).     Patient's catheter checked by Karsten Ro.  Please see her notes for evaluation.  He does not appear to have the balloon lodged in the  urethra.  Urine is flowing normally with a little bit of blood-tinged urine.  He is not complaining any the symptoms of urinary retention or fever suggestive of infection.  This bleeding is likely due to trauma secondary to tugging at the catheter.  Patient leg bag secured and catheter secured to the leg.  He appears appropriate for discharge at this time he follow-up with [facility]. Final Clinical Impressions(s) / ED Diagnoses   Final diagnoses:  None    ED Discharge Orders    None       Margarita Mail, PA-C 04/25/18 1727    Charlesetta Shanks, MD 04/28/18 [phone]

## 2018-04-25 NOTE — ED Triage Notes (Signed)
Patient presented with a foley cath. Patient states he was laying in bed last night and the tape came off, pulling his foley cath some. Patient has urine that is draining in his foley cath bag. Patient also asking for a leg bag.Dylan Gonzalez

## 2018-04-25 NOTE — ED Notes (Signed)
Bed: WTR5 Expected date:  Expected time:  Means of arrival:  Comments: 

## 2018-04-25 NOTE — Discharge Instructions (Addendum)
Contact a health care provider if: Your urine is cloudy. Your urine smells unusually bad. Your urine is not draining into the bag. Your catheter gets clogged. Your catheter starts to leak. Your bladder feels full. Get help right away if: You have redness, swelling, or pain where the catheter enters your body. You have fluid, pus, or a bad smell coming from the area where the catheter enters your body. The area where the catheter enters your body feels warm to the touch. You have a fever. You have pain in your abdomen, legs, lower back, or bladder. You see blood fill the catheter. Your urine is pink or red. You have nausea, vomiting, or chills. Your catheter gets pulled out.

## 2018-04-26 DIAGNOSIS — N131 Hydronephrosis with ureteral stricture, not elsewhere classified: Secondary | ICD-10-CM | POA: Diagnosis not present

## 2018-04-26 DIAGNOSIS — R339 Retention of urine, unspecified: Secondary | ICD-10-CM | POA: Diagnosis not present

## 2018-04-26 DIAGNOSIS — C495 Malignant neoplasm of connective and soft tissue of pelvis: Secondary | ICD-10-CM | POA: Diagnosis not present

## 2018-04-26 DIAGNOSIS — N403 Nodular prostate with lower urinary tract symptoms: Secondary | ICD-10-CM | POA: Diagnosis not present

## 2018-04-26 DIAGNOSIS — R1900 Intra-abdominal and pelvic swelling, mass and lump, unspecified site: Secondary | ICD-10-CM | POA: Diagnosis not present

## 2018-04-26 DIAGNOSIS — R609 Edema, unspecified: Secondary | ICD-10-CM | POA: Diagnosis not present

## 2018-04-26 LAB — URINE CULTURE

## 2018-04-27 ENCOUNTER — Telehealth: Payer: Self-pay | Admitting: *Deleted

## 2018-04-27 DIAGNOSIS — Z85840 Personal history of malignant neoplasm of eye: Secondary | ICD-10-CM | POA: Diagnosis not present

## 2018-04-27 DIAGNOSIS — R918 Other nonspecific abnormal finding of lung field: Secondary | ICD-10-CM | POA: Diagnosis not present

## 2018-04-27 DIAGNOSIS — Z7951 Long term (current) use of inhaled steroids: Secondary | ICD-10-CM | POA: Diagnosis not present

## 2018-04-27 DIAGNOSIS — I252 Old myocardial infarction: Secondary | ICD-10-CM | POA: Diagnosis not present

## 2018-04-27 DIAGNOSIS — Z466 Encounter for fitting and adjustment of urinary device: Secondary | ICD-10-CM | POA: Diagnosis not present

## 2018-04-27 DIAGNOSIS — R339 Retention of urine, unspecified: Secondary | ICD-10-CM | POA: Diagnosis not present

## 2018-04-27 DIAGNOSIS — C61 Malignant neoplasm of prostate: Secondary | ICD-10-CM | POA: Diagnosis not present

## 2018-04-27 DIAGNOSIS — G893 Neoplasm related pain (acute) (chronic): Secondary | ICD-10-CM | POA: Diagnosis not present

## 2018-04-27 DIAGNOSIS — I700 Atherosclerosis of aorta: Secondary | ICD-10-CM | POA: Diagnosis not present

## 2018-04-27 DIAGNOSIS — I10 Essential (primary) hypertension: Secondary | ICD-10-CM | POA: Diagnosis not present

## 2018-04-27 NOTE — Progress Notes (Signed)
ED Antimicrobial Stewardship Positive Culture Follow Up   Dylan Gonzalez is an 52 y.o. male who presented to [facility] on 04/25/2018 with a chief complaint of  Chief Complaint  Patient presents with  . foley cath issues    Recent Results (from the past 720 hour(s))  Urine C&S     Status: None   Collection Time: 03/29/18 10:48 PM  Result Value Ref Range Status   Specimen Description URINE, CLEAN CATCH  Final   Special Requests NONE  Final   Culture   Final    NO GROWTH Performed at [facility], [address] [place], [place] [address]    Report Status 03/31/2018 FINAL  Final  Urine Culture     Status: Abnormal   Collection Time: 04/23/18  1:12 AM  Result Value Ref Range Status   Specimen Description   Final    URINE, RANDOM Performed at [facility] [address] [place], [place] [address]    Special Requests   Final    NONE Performed at [facility], [place] [address] [place], [place] [address]    Culture (A)  Final    70,000 COLONIES/mL COMAMONAS ACIDOVORANS 70,000 COLONIES/mL CHRYSEOBACTERIUM GLEUM    Report Status 04/26/2018 FINAL  Final   Organism ID, Bacteria COMAMONAS ACIDOVORANS (A)  Final   Organism ID, Bacteria CHRYSEOBACTERIUM GLEUM (A)  Final      Susceptibility   Chryseobacterium gleum - MIC*    CEFAZOLIN >=64 RESISTANT Resistant     CEFTRIAXONE 16 INTERMEDIATE Intermediate     CIPROFLOXACIN 1 SENSITIVE Sensitive     GENTAMICIN >=16 RESISTANT Resistant     IMIPENEM <=0.25 SENSITIVE Sensitive     TRIMETH/SULFA <=20 SENSITIVE Sensitive     PIP/TAZO 8 SENSITIVE Sensitive     * 70,000 COLONIES/mL CHRYSEOBACTERIUM GLEUM   Comamonas acidovorans - MIC*    CEFAZOLIN 32 INTERMEDIATE Intermediate     CEFTRIAXONE <=1 SENSITIVE Sensitive     CIPROFLOXACIN <=0.25 SENSITIVE Sensitive     GENTAMICIN >=16 RESISTANT Resistant     IMIPENEM <=0.25 SENSITIVE Sensitive     TRIMETH/SULFA <=20 SENSITIVE Sensitive    PIP/TAZO <=4 SENSITIVE Sensitive     * 70,000 COLONIES/mL COMAMONAS ACIDOVORANS    [x]  Treated with Cefuroxime, organism resistant to prescribed antimicrobial []  Patient discharged originally without antimicrobial agent and treatment is now indicated  New antibiotic prescription: No further treatment necessary for asymptomatic bacteriuria. Patient already scheduled for f/u with Urology. Further antibiotic recommendations per Urology.  ED Provider: Nuala Alpha, PA-C   Romona Curls 04/27/2018, 9:37 AM Clinical Pharmacist Monday - Friday phone -  [phone] Saturday - Sunday phone - [phone]

## 2018-04-27 NOTE — Telephone Encounter (Signed)
Post ED Visit - Positive Culture Follow-up  Culture report reviewed by antimicrobial stewardship pharmacist:  []  Elenor Quinones, Pharm.D. []  Heide Guile, Pharm.D., BCPS AQ-ID []  Parks Neptune, Pharm.D., BCPS []  Alycia Rossetti, Pharm.D., BCPS []  [place], Pharm.D., BCPS, AAHIVP []  Legrand Como, Pharm.D., BCPS, AAHIVP []  Salome Arnt, PharmD, BCPS []  Johnnette Gourd, PharmD, BCPS []  Hughes Better, PharmD, BCPS []  Leeroy Cha, PharmD  Positive urine culture, reviewed by Nuala Alpha, PA-C Treated with Cefuroxime Axetil, asymptomatic bacteriuria and no further patient follow-up is required at this time.  Harlon [facility] 04/27/2018, 10:46 AM

## 2018-04-28 ENCOUNTER — Telehealth: Payer: Self-pay | Admitting: *Deleted

## 2018-04-28 NOTE — Telephone Encounter (Signed)
Returned patient's phone call regarding referral to [facility]. Informed him that the referral had been made and that [facility] should be calling to arrange the appointment date and time. He verbalized understanding.

## 2018-05-02 ENCOUNTER — Telehealth: Payer: Self-pay | Admitting: *Deleted

## 2018-05-02 NOTE — Telephone Encounter (Signed)
Referral made for patient to see dr riedel at [facility]. Faxed demographics, labs,o.v. Notes, pathology and scan results to [phone]. Attn: NP coordinator cateria halloway

## 2018-05-04 DIAGNOSIS — Z466 Encounter for fitting and adjustment of urinary device: Secondary | ICD-10-CM | POA: Diagnosis not present

## 2018-05-04 DIAGNOSIS — I700 Atherosclerosis of aorta: Secondary | ICD-10-CM | POA: Diagnosis not present

## 2018-05-04 DIAGNOSIS — R339 Retention of urine, unspecified: Secondary | ICD-10-CM | POA: Diagnosis not present

## 2018-05-04 DIAGNOSIS — Z7951 Long term (current) use of inhaled steroids: Secondary | ICD-10-CM | POA: Diagnosis not present

## 2018-05-04 DIAGNOSIS — R918 Other nonspecific abnormal finding of lung field: Secondary | ICD-10-CM | POA: Diagnosis not present

## 2018-05-04 DIAGNOSIS — G893 Neoplasm related pain (acute) (chronic): Secondary | ICD-10-CM | POA: Diagnosis not present

## 2018-05-04 DIAGNOSIS — Z85840 Personal history of malignant neoplasm of eye: Secondary | ICD-10-CM | POA: Diagnosis not present

## 2018-05-04 DIAGNOSIS — I10 Essential (primary) hypertension: Secondary | ICD-10-CM | POA: Diagnosis not present

## 2018-05-04 DIAGNOSIS — I252 Old myocardial infarction: Secondary | ICD-10-CM | POA: Diagnosis not present

## 2018-05-04 DIAGNOSIS — C61 Malignant neoplasm of prostate: Secondary | ICD-10-CM | POA: Diagnosis not present

## 2018-05-05 DIAGNOSIS — R918 Other nonspecific abnormal finding of lung field: Secondary | ICD-10-CM | POA: Diagnosis not present

## 2018-05-05 DIAGNOSIS — Z466 Encounter for fitting and adjustment of urinary device: Secondary | ICD-10-CM | POA: Diagnosis not present

## 2018-05-05 DIAGNOSIS — I10 Essential (primary) hypertension: Secondary | ICD-10-CM | POA: Diagnosis not present

## 2018-05-05 DIAGNOSIS — I700 Atherosclerosis of aorta: Secondary | ICD-10-CM | POA: Diagnosis not present

## 2018-05-05 DIAGNOSIS — C61 Malignant neoplasm of prostate: Secondary | ICD-10-CM | POA: Diagnosis not present

## 2018-05-05 DIAGNOSIS — Z7951 Long term (current) use of inhaled steroids: Secondary | ICD-10-CM | POA: Diagnosis not present

## 2018-05-05 DIAGNOSIS — Z85840 Personal history of malignant neoplasm of eye: Secondary | ICD-10-CM | POA: Diagnosis not present

## 2018-05-05 DIAGNOSIS — R339 Retention of urine, unspecified: Secondary | ICD-10-CM | POA: Diagnosis not present

## 2018-05-05 DIAGNOSIS — I252 Old myocardial infarction: Secondary | ICD-10-CM | POA: Diagnosis not present

## 2018-05-05 DIAGNOSIS — G893 Neoplasm related pain (acute) (chronic): Secondary | ICD-10-CM | POA: Diagnosis not present

## 2018-05-05 NOTE — Telephone Encounter (Signed)
Returned patient's phone call regarding referral to [facility]. Informed him that the referral has been made and that [facility] will be calling him to arrange the appointment. He verbalized understanding.

## 2018-05-09 ENCOUNTER — Other Ambulatory Visit: Payer: Self-pay

## 2018-05-09 ENCOUNTER — Telehealth: Payer: Self-pay

## 2018-05-09 MED ORDER — HYDROCODONE-ACETAMINOPHEN 5-325 MG PO TABS: 1.0000 | ORAL_TABLET | Freq: Four times a day (QID) | ORAL | 0 refills | Status: DC | PRN

## 2018-05-09 NOTE — Telephone Encounter (Signed)
Follow up call to patient and he stated that he also called his PCP which has already called a prescription for the Norco to his pharmacy. Will shred the current prescription.

## 2018-05-09 NOTE — Telephone Encounter (Signed)
-----   Message from [name], MD sent at 05/09/2018 11:55 AM EDT ----- Regarding: RE: Norco refill Ok to refill.  ----- Message ----- From: Scot Dock, RN Sent: 05/09/2018  11:43 AM EDT To: [name], MD Subject: Norco refill                                   Patient is requesting a refill of Norco. Please advise. Thanks

## 2018-05-15 ENCOUNTER — Inpatient Hospital Stay: Payer: BLUE CROSS/BLUE SHIELD | Attending: Oncology | Admitting: Oncology

## 2018-05-15 ENCOUNTER — Telehealth: Payer: Self-pay | Admitting: Oncology

## 2018-05-15 VITALS — BP 159/104 | HR 88 | Temp 97.8°F | Resp 18 | Ht 71.0 in | Wt 216.1 lb

## 2018-05-15 DIAGNOSIS — Z7189 Other specified counseling: Secondary | ICD-10-CM

## 2018-05-15 DIAGNOSIS — C419 Malignant neoplasm of bone and articular cartilage, unspecified: Secondary | ICD-10-CM

## 2018-05-15 DIAGNOSIS — R52 Pain, unspecified: Secondary | ICD-10-CM | POA: Diagnosis not present

## 2018-05-15 DIAGNOSIS — R339 Retention of urine, unspecified: Secondary | ICD-10-CM | POA: Diagnosis not present

## 2018-05-15 DIAGNOSIS — Z5111 Encounter for antineoplastic chemotherapy: Secondary | ICD-10-CM | POA: Diagnosis not present

## 2018-05-15 DIAGNOSIS — Z79899 Other long term (current) drug therapy: Secondary | ICD-10-CM | POA: Diagnosis not present

## 2018-05-15 DIAGNOSIS — N403 Nodular prostate with lower urinary tract symptoms: Secondary | ICD-10-CM | POA: Diagnosis not present

## 2018-05-15 DIAGNOSIS — C494 Malignant neoplasm of connective and soft tissue of abdomen: Secondary | ICD-10-CM

## 2018-05-15 DIAGNOSIS — C61 Malignant neoplasm of prostate: Secondary | ICD-10-CM

## 2018-05-15 DIAGNOSIS — R338 Other retention of urine: Secondary | ICD-10-CM | POA: Diagnosis not present

## 2018-05-15 MED ORDER — PROCHLORPERAZINE MALEATE 10 MG PO TABS: 10.0000 mg | ORAL_TABLET | Freq: Four times a day (QID) | ORAL | 0 refills | Status: DC | PRN

## 2018-05-15 MED ORDER — HYDROCODONE-ACETAMINOPHEN 5-325 MG PO TABS: 1.0000 | ORAL_TABLET | Freq: Four times a day (QID) | ORAL | 0 refills | Status: DC | PRN

## 2018-05-15 NOTE — Progress Notes (Signed)
Hematology and Oncology Follow Up Visit  SAGE KOPERA [id number] 10-21-1965 52 y.o. 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  He presented with a large pelvic mass measuring 8 x 10 cm.  Prior Therapy: He is S/P transrectal prostate ultrasound and a prostate biopsy as well as a cystoscopy with insertion of double-J stent and transurethral resection of the prostate completed by Dr. Jeffie Pollock on April 13, 2018.  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  Immunohistochemical stains showed positive for desmin, smooth muscle actin, muscle specific actin and negative for PSA.  He was also negative for cytokeratin 7, 903, CD117 and CD34.     Current therapy: Under evaluation for systemic therapy.  Interim History: Mr. Staubs presents today for a follow-up.  Since her last visit, he reports no major changes or complaints.  He still has an indwelling Foley catheter with intermittent pain for which he uses hydrocodone every 4-6 hours.  He has been sleeping reasonably well without any disruption in ability to do so.  His appetite remains about the same without any major changes.  He denies any back pain or pathological fractures.  His appetite and weight remain the same.  He does not report any headaches, blurry vision, syncope or seizures. Does not report any fevers, chills or sweats.  Does not report any cough, wheezing or hemoptysis.  Does not report any chest pain, palpitation, orthopnea or leg edema.  Does not report any nausea, vomiting or abdominal pain.  Does not report any constipation or diarrhea.  Does not report any skeletal complaints.    Does not report frequency, urgency or hematuria.  Does not report any skin rashes or lesions. Does not report any heat or cold intolerance.  Does not  report any lymphadenopathy or petechiae.  Does not report any anxiety or depression.  Remaining review of systems is negative.    Medications: I have reviewed the patient's current medications.  Current Outpatient Medications  Medication Sig Dispense Refill  . amLODipine (NORVASC) 10 MG tablet Take 1 tablet (10 mg total) by mouth daily. 30 tablet 1  . belladonna-opium (B&O SUPPRETTES) 16.2-30 MG suppository Place 1 suppository (30 mg total) rectally every 8 (eight) hours as needed for pain. 20 suppository 0  . cefUROXime (CEFTIN) 250 MG tablet Take 1 tablet (250 mg total) by mouth 2 (two) times daily with a meal. 20 tablet 0  . cetirizine (ZYRTEC) 10 MG tablet Take 10 mg by mouth daily as needed for allergies.    . fluticasone (FLONASE) 50 MCG/ACT nasal spray PLACE 2 SPRAYS INTO BOTH NOSTRILS DAILY. (Patient not taking: Reported on 04/13/2018) 16 g 9  . HYDROcodone-acetaminophen (NORCO/VICODIN) 5-325 MG tablet Take 1 tablet by mouth every 6 (six) hours as needed for moderate pain. 30 tablet 0  . hydrocortisone cream 1 % Apply to affected area 2 times daily (Patient not taking: Reported on 04/13/2018) 15 g 0  . hydrOXYzine (VISTARIL) 100 MG capsule TAKE ONE CAPSULE BY MOUTH AT BEDTIME AS NEEDED FOR ALLERGY (Patient taking differently: Take 100 mg by mouth at bedtime as needed (allergies). ) 30 capsule 5  . Hyoscyamine Sulfate SL (LEVSIN/SL) 0.125 MG SUBL Place 0.125 mg under the tongue every 8 (eight) hours as needed. (Patient taking differently: Place 0.125 mg under the tongue every 8 (eight) hours as  needed (cramping). ) 120 each 0  . metoprolol tartrate (LOPRESSOR) 50 MG tablet Take 1 tablet (50 mg total) by mouth 2 (two) times daily. 60 tablet 1  . Multiple Vitamin (MULTIVITAMIN) tablet Take 1 tablet by mouth daily.    . phenazopyridine (PYRIDIUM) 200 MG tablet Take 1 tablet (200 mg total) by mouth 3 (three) times daily as needed (burning). (Patient not taking: Reported on 04/23/2018) 30 tablet 0  .  polyethylene glycol (MIRALAX / GLYCOLAX) packet Take 17 g by mouth 2 (two) times daily as needed for mild constipation or moderate constipation. 14 each 0   No current facility-administered medications for this visit.      Allergies:  Allergies  Allergen Reactions  . Pollen Extract Other (See Comments)    Sneezing and shortness of breath    Past Medical History, Surgical history, Social history, and Family History were reviewed and updated.    Physical Exam: Blood pressure (!) 159/104, pulse 88, temperature 97.8 F (36.6 C), temperature source Oral, resp. rate 18, height _0  (1.803 m), weight 216 lb 1.6 oz (98 kg), SpO2 98 %.   ECOG: 1 General appearance: alert and cooperative appeared with mild discomfort. Head: Normocephalic, without obvious abnormality Oropharynx: No oral thrush or ulcers. Eyes: No scleral icterus.  Pupils are equal and round reactive to light. Lymph nodes: Cervical, supraclavicular, and axillary nodes normal. Heart:regular rate and rhythm, S1, S2 normal, no murmur, click, rub or gallop Lung:chest clear, no wheezing, rales, normal symmetric air entry Abdomin: soft, non-tender, without masses or organomegaly. Neurological: No motor, sensory deficits.  Intact deep tendon reflexes. Skin: No rashes or lesions.  No ecchymosis or petechiae. Musculoskeletal: No joint deformity or effusion. Psychiatric: Mood and affect are appropriate.    Lab Results: Lab Results  Component Value Date   WBC 11.5 (H) 04/23/2018   HGB 12.2 (L) 04/23/2018   HCT 37.5 (L) 04/23/2018   MCV 96.9 04/23/2018   PLT 359 04/23/2018     Chemistry      Component Value Date/Time   NA 141 04/23/2018 1503   K 3.6 04/23/2018 1503   CL 103 04/23/2018 1503   CO2 29 04/23/2018 1503   BUN 14 04/23/2018 1503   CREATININE 1.28 (H) 04/23/2018 1503   CREATININE 1.07 06/01/2014 1257      Component Value Date/Time   CALCIUM 9.5 04/23/2018 1503   ALKPHOS 42 04/23/2018 0146   AST 28  04/23/2018 0146   ALT 21 04/23/2018 0146   BILITOT 1.1 04/23/2018 0146         Impression and Plan:  52 year old man with:  1.  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    This disease process was discussed today with the patient and his father today in detail.  I have referred him to [facility] for an opinion regarding this unusual tumor unfortunately no appointment has been made yet.  Given the urgent nature of treating this disease, I have recommended starting systemic chemotherapy as soon as possible.  Given the rarity of this tumor, there is really no standard approach to treat this entity and relying on case studies would be our best bet.  Systemic chemotherapy can the form of Adriamycin, ifosfamide regimen versus docetaxel and gemcitabine chemotherapy.  Risks and benefits of systemic chemotherapy and long-term complications were reviewed.    Complication associated with chemotherapy include nausea, vomiting, myelosuppression  specifically peripheral neuropathy, infusion related toxicity with Taxotere.  Myelosuppression, bleeding and neutropenic infection is also common especially in the setting of gemcitabine.  I will restage him with a CT scan in the immediate future.  We will proceed with Taxotere and gemcitabine chemotherapy regimen and we we will continue our effort to get him scheduled to see at [facility] for a second opinion regarding best treatment approach.  He will also attend chemo education class for the start of chemotherapy.  2.  Urinary retention: Foley catheter in place without any issue or hematuria at this time.  3.  Pain: Manageable with hydrocodone which I have refilled for him today.  4. Antiemetics: Prescription for Compazine was given to him today.  5.  IV access: Risks and benefits of Port-A-Cath  insertion was reviewed today.  Long-term complications associated with this including infection, bleeding and thrombosis.  After discussion today he is agreeable to proceed.  5.  Prognosis: I do not think there is a curative option at this point.  Any treatment is palliative.  6.  Follow-up: We will be in the future to start chemotherapy.  25  minutes was spent with the patient face-to-face today.  More than 50% of time was dedicated to discussing his disease process, treatment options and future management plan.     [name], MD 9/9/20199:52 AM

## 2018-05-15 NOTE — Progress Notes (Signed)
START OFF PATHWAY REGIMEN - [Other Dx]   [id number]:Docetaxel + Gemcitabine (100/900) q21d:   A cycle is every 21 days:     Gemcitabine      Docetaxel   **Always confirm dose/schedule in your pharmacy ordering system**  Patient Characteristics: Intent of Therapy: Non-Curative / Palliative Intent, Discussed with Patient

## 2018-05-15 NOTE — Telephone Encounter (Signed)
Gave pt avs and calendar  °

## 2018-05-16 ENCOUNTER — Telehealth: Payer: Self-pay | Admitting: *Deleted

## 2018-05-16 ENCOUNTER — Other Ambulatory Visit: Payer: BLUE CROSS/BLUE SHIELD

## 2018-05-16 ENCOUNTER — Telehealth: Payer: Self-pay | Admitting: Oncology

## 2018-05-16 DIAGNOSIS — R339 Retention of urine, unspecified: Secondary | ICD-10-CM | POA: Diagnosis not present

## 2018-05-16 DIAGNOSIS — C61 Malignant neoplasm of prostate: Secondary | ICD-10-CM | POA: Diagnosis not present

## 2018-05-16 NOTE — Telephone Encounter (Signed)
"  Dylan Gonzalez returning call I missed from this number an hour ago to find out what it was in reference to."  No call notes at this time.  Reviewed 05-18-2018 appointment information.  Message left for collaborative to return call if tryng to reach patient.

## 2018-05-16 NOTE — Telephone Encounter (Signed)
Faxed medical records to [facility], Release ID: [id number]

## 2018-05-17 ENCOUNTER — Telehealth: Payer: Self-pay | Admitting: *Deleted

## 2018-05-17 NOTE — Telephone Encounter (Signed)
Spoke with clara at dr riedel's office. Referral appts 05/24/18 @ 10:30 with GU surgeon dr Dimas Millin, 11:00 with sarcoma oncologist dr Angelina Ok. On 05/25/18 appt with radiation oncologist. They will mail patient a packet with directions for parking and building locations. Patient verbalized understanding.

## 2018-05-18 ENCOUNTER — Inpatient Hospital Stay: Payer: BLUE CROSS/BLUE SHIELD

## 2018-05-18 ENCOUNTER — Telehealth: Payer: Self-pay | Admitting: *Deleted

## 2018-05-18 NOTE — Telephone Encounter (Signed)
Spoke with patient, per dr Alen Blew, cancel port placement and chemotherapy, patient will go to [facility] 9/18 and 9/19 to see dr riedel

## 2018-05-22 ENCOUNTER — Ambulatory Visit (HOSPITAL_COMMUNITY)
Admission: RE | Admit: 2018-05-22 | Discharge: 2018-05-22 | Disposition: A | Discharge status: Home / Self Care | Payer: BLUE CROSS/BLUE SHIELD | Source: Ambulatory Visit | Attending: Oncology | Admitting: Oncology

## 2018-05-22 DIAGNOSIS — C7802 Secondary malignant neoplasm of left lung: Secondary | ICD-10-CM | POA: Insufficient documentation

## 2018-05-22 DIAGNOSIS — C7800 Secondary malignant neoplasm of unspecified lung: Secondary | ICD-10-CM | POA: Diagnosis not present

## 2018-05-22 DIAGNOSIS — C494 Malignant neoplasm of connective and soft tissue of abdomen: Secondary | ICD-10-CM | POA: Diagnosis not present

## 2018-05-22 DIAGNOSIS — C419 Malignant neoplasm of bone and articular cartilage, unspecified: Secondary | ICD-10-CM

## 2018-05-22 DIAGNOSIS — R1900 Intra-abdominal and pelvic swelling, mass and lump, unspecified site: Secondary | ICD-10-CM | POA: Diagnosis not present

## 2018-05-22 DIAGNOSIS — K769 Liver disease, unspecified: Secondary | ICD-10-CM | POA: Insufficient documentation

## 2018-05-22 DIAGNOSIS — C7801 Secondary malignant neoplasm of right lung: Secondary | ICD-10-CM | POA: Diagnosis not present

## 2018-05-22 DIAGNOSIS — C61 Malignant neoplasm of prostate: Secondary | ICD-10-CM | POA: Diagnosis not present

## 2018-05-22 MED ORDER — IOHEXOL 300 MG/ML  SOLN
100.0000 mL | Freq: Once | INTRAMUSCULAR | Status: AC | PRN
  Administered 2018-05-22: 100 mL via INTRAVENOUS

## 2018-05-23 ENCOUNTER — Inpatient Hospital Stay: Payer: BLUE CROSS/BLUE SHIELD

## 2018-05-23 DIAGNOSIS — C499 Malignant neoplasm of connective and soft tissue, unspecified: Secondary | ICD-10-CM | POA: Insufficient documentation

## 2018-05-24 ENCOUNTER — Ambulatory Visit (HOSPITAL_COMMUNITY): Payer: BLUE CROSS/BLUE SHIELD

## 2018-05-24 ENCOUNTER — Other Ambulatory Visit (HOSPITAL_COMMUNITY): Payer: BLUE CROSS/BLUE SHIELD

## 2018-05-24 DIAGNOSIS — N135 Crossing vessel and stricture of ureter without hydronephrosis: Secondary | ICD-10-CM | POA: Diagnosis not present

## 2018-05-24 DIAGNOSIS — R918 Other nonspecific abnormal finding of lung field: Secondary | ICD-10-CM | POA: Insufficient documentation

## 2018-05-24 DIAGNOSIS — C499 Malignant neoplasm of connective and soft tissue, unspecified: Secondary | ICD-10-CM | POA: Diagnosis not present

## 2018-05-25 ENCOUNTER — Telehealth: Payer: Self-pay | Admitting: *Deleted

## 2018-05-25 ENCOUNTER — Other Ambulatory Visit: Payer: Self-pay | Admitting: Oncology

## 2018-05-25 ENCOUNTER — Telehealth: Payer: Self-pay | Admitting: Oncology

## 2018-05-25 ENCOUNTER — Other Ambulatory Visit: Payer: Self-pay | Admitting: *Deleted

## 2018-05-25 DIAGNOSIS — C494 Malignant neoplasm of connective and soft tissue of abdomen: Secondary | ICD-10-CM

## 2018-05-25 DIAGNOSIS — C7801 Secondary malignant neoplasm of right lung: Secondary | ICD-10-CM | POA: Diagnosis not present

## 2018-05-25 DIAGNOSIS — C499 Malignant neoplasm of connective and soft tissue, unspecified: Secondary | ICD-10-CM | POA: Diagnosis not present

## 2018-05-25 DIAGNOSIS — C7802 Secondary malignant neoplasm of left lung: Secondary | ICD-10-CM | POA: Diagnosis not present

## 2018-05-25 DIAGNOSIS — C7800 Secondary malignant neoplasm of unspecified lung: Secondary | ICD-10-CM

## 2018-05-25 DIAGNOSIS — C495 Malignant neoplasm of connective and soft tissue of pelvis: Secondary | ICD-10-CM | POA: Diagnosis not present

## 2018-05-25 NOTE — Telephone Encounter (Signed)
Received voice mail message from patient stating,"I just left Dr. Ardelle Anton office at [facility] for a second opinion, and he agrees with Dr. Alen Blew. So, let's get this ball rolling. I need to get a port-a-cath placed and start chemotherapy. My return number is [phone]."

## 2018-05-25 NOTE — Telephone Encounter (Signed)
He is scheduled to start chemo on 9/24. Please call IR to have port placed. Orders are in.

## 2018-05-25 NOTE — Telephone Encounter (Signed)
Appts added to current schedule per 9/19 sch msg

## 2018-05-26 ENCOUNTER — Telehealth: Payer: Self-pay | Admitting: *Deleted

## 2018-05-26 DIAGNOSIS — C499 Malignant neoplasm of connective and soft tissue, unspecified: Secondary | ICD-10-CM | POA: Diagnosis not present

## 2018-05-26 DIAGNOSIS — C61 Malignant neoplasm of prostate: Secondary | ICD-10-CM | POA: Diagnosis not present

## 2018-05-26 NOTE — Telephone Encounter (Signed)
Returned patient's phone call regarding left foot and ankle is swollen more than the right foot. I instructed the patient to keep both of his feet elevated over the weekend. He is taking his blood pressure medicine. He can put his socks and shoes on without difficulty. I also instructed him to go to the ER over the weekend if he noticed increasing swelling, became painful, couldn't walk on his foot. He verbalized understanding.

## 2018-05-30 ENCOUNTER — Inpatient Hospital Stay: Payer: BLUE CROSS/BLUE SHIELD

## 2018-05-30 ENCOUNTER — Other Ambulatory Visit: Payer: Self-pay | Admitting: *Deleted

## 2018-05-30 ENCOUNTER — Telehealth: Payer: Self-pay | Admitting: *Deleted

## 2018-05-30 ENCOUNTER — Telehealth: Payer: Self-pay | Admitting: Oncology

## 2018-05-30 VITALS — BP 141/97 | HR 91 | Temp 97.6°F | Resp 18

## 2018-05-30 DIAGNOSIS — R338 Other retention of urine: Secondary | ICD-10-CM | POA: Diagnosis not present

## 2018-05-30 DIAGNOSIS — Z79899 Other long term (current) drug therapy: Secondary | ICD-10-CM | POA: Diagnosis not present

## 2018-05-30 DIAGNOSIS — C494 Malignant neoplasm of connective and soft tissue of abdomen: Secondary | ICD-10-CM

## 2018-05-30 DIAGNOSIS — C61 Malignant neoplasm of prostate: Secondary | ICD-10-CM | POA: Diagnosis not present

## 2018-05-30 DIAGNOSIS — Z5111 Encounter for antineoplastic chemotherapy: Secondary | ICD-10-CM | POA: Diagnosis not present

## 2018-05-30 LAB — CBC WITH DIFFERENTIAL (CANCER CENTER ONLY)
BASOS PCT: 1 %
Basophils Absolute: 0.1 10*3/uL (ref 0.0–0.1)
EOS ABS: 0.5 10*3/uL (ref 0.0–0.5)
EOS PCT: 5 %
HCT: 37.7 % — ABNORMAL LOW (ref 38.4–49.9)
Hemoglobin: 12 g/dL — ABNORMAL LOW (ref 13.0–17.1)
Lymphocytes Relative: 17 %
Lymphs Abs: 1.6 10*3/uL (ref 0.9–3.3)
MCH: 31.7 pg (ref 27.2–33.4)
MCHC: 31.8 g/dL — ABNORMAL LOW (ref 32.0–36.0)
MCV: 99.5 fL — ABNORMAL HIGH (ref 79.3–98.0)
Monocytes Absolute: 0.8 10*3/uL (ref 0.1–0.9)
Monocytes Relative: 9 %
Neutro Abs: 6.3 10*3/uL (ref 1.5–6.5)
Neutrophils Relative %: 68 %
PLATELETS: 234 10*3/uL (ref 140–400)
RBC: 3.79 MIL/uL — AB (ref 4.20–5.82)
RDW: 13 % (ref 11.0–14.6)
WBC: 9.2 10*3/uL (ref 4.0–10.3)

## 2018-05-30 LAB — CMP (CANCER CENTER ONLY)
ALBUMIN: 3.8 g/dL (ref 3.5–5.0)
ALT: 13 U/L (ref 0–44)
ANION GAP: 11 (ref 5–15)
AST: 21 U/L (ref 15–41)
Alkaline Phosphatase: 57 U/L (ref 38–126)
BUN: 14 mg/dL (ref 6–20)
CHLORIDE: 103 mmol/L (ref 98–111)
CO2: 30 mmol/L (ref 22–32)
Calcium: 9.5 mg/dL (ref 8.9–10.3)
Creatinine: 1.29 mg/dL — ABNORMAL HIGH (ref 0.61–1.24)
GFR, Estimated: >60 mL/min (ref >60)
Glucose, Bld: 116 mg/dL — ABNORMAL HIGH (ref 70–99)
Potassium: 3.4 mmol/L — ABNORMAL LOW (ref 3.5–5.1)
SODIUM: 144 mmol/L (ref 135–145)
Total Bilirubin: 0.4 mg/dL (ref 0.3–1.2)
Total Protein: 7.1 g/dL (ref 6.5–8.1)

## 2018-05-30 MED ORDER — SODIUM CHLORIDE 0.9 % IV SOLN
Freq: Once | INTRAVENOUS | Status: AC
  Administered 2018-05-30: 09:00:00 via INTRAVENOUS
  Filled 2018-05-30: qty 250

## 2018-05-30 MED ORDER — PROCHLORPERAZINE MALEATE 10 MG PO TABS
ORAL_TABLET | ORAL | Status: AC
  Filled 2018-05-30: qty 1

## 2018-05-30 MED ORDER — PROCHLORPERAZINE MALEATE 10 MG PO TABS
10.0000 mg | ORAL_TABLET | Freq: Once | ORAL | Status: AC
  Administered 2018-05-30: 10 mg via ORAL

## 2018-05-30 MED ORDER — OXYCODONE HCL 5 MG PO TABS: 5.0000 mg | ORAL_TABLET | ORAL | 0 refills | Status: DC | PRN

## 2018-05-30 MED ORDER — SODIUM CHLORIDE 0.9 % IV SOLN
2000.0000 mg | Freq: Once | INTRAVENOUS | Status: DC
  Filled 2018-05-30: qty 53

## 2018-05-30 MED ORDER — SODIUM CHLORIDE 0.9 % IV SOLN
2000.0000 mg | Freq: Once | INTRAVENOUS | Status: AC
  Administered 2018-05-30: 2000 mg via INTRAVENOUS
  Filled 2018-05-30: qty 52.6

## 2018-05-30 NOTE — Telephone Encounter (Signed)
Oxycodon 5 mg. One to two tablets every 4 hours. #60.

## 2018-05-30 NOTE — Telephone Encounter (Signed)
Patient left voice mail saying that his ride was late to pick him up as his ride had the wrong time.  Will be here asap.  Note:  Voicemail received after patient already arrived for appt today.

## 2018-05-30 NOTE — Progress Notes (Signed)
Patient verbalized increased pain related to foley catheter and scrotal swelling. He states that when he does take 2 Norco tablets it helps but it is not prescribed that way. Communicated the request for increased pain management to [facility]. Patient also has burning with Gemzar administration so applied heat and slowed the rate. Communicated this to pharmacy for greater dilution for the next treatment.

## 2018-05-30 NOTE — Patient Instructions (Signed)
[facility] Discharge Instructions for Patients Receiving Chemotherapy  Today you received the following chemotherapy agents:  Gemzar.  To help prevent nausea and vomiting after your treatment, we encourage you to take your nausea medication as directed.   If you develop nausea and vomiting that is not controlled by your nausea medication, call the clinic.   BELOW ARE SYMPTOMS THAT SHOULD BE REPORTED IMMEDIATELY:  *FEVER GREATER THAN 100.5 F  *CHILLS WITH OR WITHOUT FEVER  NAUSEA AND VOMITING THAT IS NOT CONTROLLED WITH YOUR NAUSEA MEDICATION  *UNUSUAL SHORTNESS OF BREATH  *UNUSUAL BRUISING OR BLEEDING  TENDERNESS IN MOUTH AND THROAT WITH OR WITHOUT PRESENCE OF ULCERS  *URINARY PROBLEMS  *BOWEL PROBLEMS  UNUSUAL RASH Items with * indicate a potential emergency and should be followed up as soon as possible.  Feel free to call the clinic should you have any questions or concerns. The clinic phone number is [phone].  Please show the CHEMO ALERT CARD at check-in to the Emergency Department and triage nurse.  Gemcitabine injection What is this medicine? GEMCITABINE (jem SIT a been) is a chemotherapy drug. This medicine is used to treat many types of cancer like breast cancer, lung cancer, pancreatic cancer, and ovarian cancer. This medicine may be used for other purposes; ask your health care provider or pharmacist if you have questions. COMMON BRAND NAME(S): Gemzar What should I tell my health care provider before I take this medicine? They need to know if you have any of these conditions: -blood disorders -infection -kidney disease -liver disease -recent or ongoing radiation therapy -an unusual or allergic reaction to gemcitabine, other chemotherapy, other medicines, foods, dyes, or preservatives -pregnant or trying to get pregnant -breast-feeding How should I use this medicine? This drug is given as an infusion into a vein. It is administered in a  hospital or clinic by a specially trained health care professional. Talk to your pediatrician regarding the use of this medicine in children. Special care may be needed. Overdosage: If you think you have taken too much of this medicine contact a poison control center or emergency room at once. NOTE: This medicine is only for you. Do not share this medicine with others. What if I miss a dose? It is important not to miss your dose. Call your doctor or health care professional if you are unable to keep an appointment. What may interact with this medicine? -medicines to increase blood counts like filgrastim, pegfilgrastim, sargramostim -some other chemotherapy drugs like cisplatin -vaccines Talk to your doctor or health care professional before taking any of these medicines: -acetaminophen -aspirin -ibuprofen -ketoprofen -naproxen This list may not describe all possible interactions. Give your health care provider a list of all the medicines, herbs, non-prescription drugs, or dietary supplements you use. Also tell them if you smoke, drink alcohol, or use illegal drugs. Some items may interact with your medicine. What should I watch for while using this medicine? Visit your doctor for checks on your progress. This drug may make you feel generally unwell. This is not uncommon, as chemotherapy can affect healthy cells as well as cancer cells. Report any side effects. Continue your course of treatment even though you feel ill unless your doctor tells you to stop. In some cases, you may be given additional medicines to help with side effects. Follow all directions for their use. Call your doctor or health care professional for advice if you get a fever, chills or sore throat, or other symptoms of a cold   or flu. Do not treat yourself. This drug decreases your body's ability to fight infections. Try to avoid being around people who are sick. This medicine may increase your risk to bruise or bleed. Call  your doctor or health care professional if you notice any unusual bleeding. Be careful brushing and flossing your teeth or using a toothpick because you may get an infection or bleed more easily. If you have any dental work done, tell your dentist you are receiving this medicine. Avoid taking products that contain aspirin, acetaminophen, ibuprofen, naproxen, or ketoprofen unless instructed by your doctor. These medicines may hide a fever. Women should inform their doctor if they wish to become pregnant or think they might be pregnant. There is a potential for serious side effects to an unborn child. Talk to your health care professional or pharmacist for more information. Do not breast-feed an infant while taking this medicine. What side effects may I notice from receiving this medicine? Side effects that you should report to your doctor or health care professional as soon as possible: -allergic reactions like skin rash, itching or hives, swelling of the face, lips, or tongue -low blood counts - this medicine may decrease the number of white blood cells, red blood cells and platelets. You may be at increased risk for infections and bleeding. -signs of infection - fever or chills, cough, sore throat, pain or difficulty passing urine -signs of decreased platelets or bleeding - bruising, pinpoint red spots on the skin, black, tarry stools, blood in the urine -signs of decreased red blood cells - unusually weak or tired, fainting spells, lightheadedness -breathing problems -chest pain -mouth sores -nausea and vomiting -pain, swelling, redness at site where injected -pain, tingling, numbness in the hands or feet -stomach pain -swelling of ankles, feet, hands -unusual bleeding Side effects that usually do not require medical attention (report to your doctor or health care professional if they continue or are bothersome): -constipation -diarrhea -hair loss -loss of appetite -stomach upset This  list may not describe all possible side effects. Call your doctor for medical advice about side effects. You may report side effects to FDA at 1-800-FDA-1088. Where should I keep my medicine? This drug is given in a hospital or clinic and will not be stored at home. NOTE: This sheet is a summary. It may not cover all possible information. If you have questions about this medicine, talk to your doctor, pharmacist, or health care provider.  2018 Elsevier/Gold Standard (2008-01-02 18:45:54)    

## 2018-05-30 NOTE — Telephone Encounter (Signed)
Patient in infusion for treatment. Nurse Seth Bake States he is having increased pain in groin, testicles swollen and must sit on a pillow. Taking norco for pain and it is not helping. Would like something stronger.

## 2018-06-01 ENCOUNTER — Ambulatory Visit (HOSPITAL_COMMUNITY): Payer: BLUE CROSS/BLUE SHIELD

## 2018-06-01 ENCOUNTER — Encounter: Payer: Self-pay | Admitting: General Practice

## 2018-06-01 ENCOUNTER — Inpatient Hospital Stay: Payer: BLUE CROSS/BLUE SHIELD

## 2018-06-01 NOTE — Progress Notes (Signed)
[facility] CSW Progress Notes  Call from wife, Dylan Gonzalez [phone]).  "I am disabled but I help him as much as I can, he has problems because sometimes he cannot get up, I tried to help him to the bathroom yesterday but I almost dropped him because I have MS."  Patient is on short term disability and leave from his job, per wife does not qualify for Medicaid.  Wife has already called insurance company, "they are only able to send someone out to take his blood pressure."  No additional in home support services available for patient.  Family does have some support from adult children who do live in the area.  CSW discussed option for linkage with non medical personal care services in the home, service is typically paid for out of pocket and not a covered service.  Wife declines referral, states she has DME due to her own illness/recovery, is able to enlist help from church and family, and would like to manage on her own at this point.  Would like information on food pantries and similar for help w food needs, CSW will send these resources.  Edwyna Shell, LCSW Clinical Social Worker Phone:  [phone]

## 2018-06-02 ENCOUNTER — Other Ambulatory Visit: Payer: Self-pay | Admitting: Radiology

## 2018-06-05 ENCOUNTER — Telehealth: Payer: Self-pay | Admitting: *Deleted

## 2018-06-05 ENCOUNTER — Inpatient Hospital Stay (HOSPITAL_COMMUNITY): Admission: RE | Admit: 2018-06-05 | Payer: BLUE CROSS/BLUE SHIELD | Source: Ambulatory Visit

## 2018-06-05 ENCOUNTER — Ambulatory Visit (HOSPITAL_COMMUNITY): Payer: BLUE CROSS/BLUE SHIELD

## 2018-06-05 NOTE — Telephone Encounter (Signed)
Tiffany from IR called to say patient ate oatmeal today at 1030. Pt and wife had been instructed for him to remain NPO. They will be rescheduled.

## 2018-06-06 DIAGNOSIS — Z86718 Personal history of other venous thrombosis and embolism: Secondary | ICD-10-CM

## 2018-06-06 HISTORY — DX: Personal history of other venous thrombosis and embolism: Z86.718

## 2018-06-07 ENCOUNTER — Inpatient Hospital Stay: Payer: BLUE CROSS/BLUE SHIELD

## 2018-06-07 ENCOUNTER — Inpatient Hospital Stay: Payer: BLUE CROSS/BLUE SHIELD | Attending: Oncology

## 2018-06-07 VITALS — BP 128/89 | HR 95 | Temp 99.0°F | Resp 18 | Wt 213.8 lb

## 2018-06-07 DIAGNOSIS — C494 Malignant neoplasm of connective and soft tissue of abdomen: Secondary | ICD-10-CM | POA: Diagnosis not present

## 2018-06-07 DIAGNOSIS — Z79899 Other long term (current) drug therapy: Secondary | ICD-10-CM | POA: Diagnosis not present

## 2018-06-07 DIAGNOSIS — Z5111 Encounter for antineoplastic chemotherapy: Secondary | ICD-10-CM | POA: Insufficient documentation

## 2018-06-07 DIAGNOSIS — R000 Tachycardia, unspecified: Secondary | ICD-10-CM | POA: Diagnosis not present

## 2018-06-07 DIAGNOSIS — I219 Acute myocardial infarction, unspecified: Secondary | ICD-10-CM | POA: Insufficient documentation

## 2018-06-07 DIAGNOSIS — I82412 Acute embolism and thrombosis of left femoral vein: Secondary | ICD-10-CM | POA: Diagnosis not present

## 2018-06-07 DIAGNOSIS — R072 Precordial pain: Secondary | ICD-10-CM | POA: Diagnosis not present

## 2018-06-07 DIAGNOSIS — D649 Anemia, unspecified: Secondary | ICD-10-CM | POA: Diagnosis not present

## 2018-06-07 DIAGNOSIS — Z5189 Encounter for other specified aftercare: Secondary | ICD-10-CM | POA: Diagnosis not present

## 2018-06-07 DIAGNOSIS — R310 Gross hematuria: Secondary | ICD-10-CM | POA: Diagnosis not present

## 2018-06-07 DIAGNOSIS — Z7901 Long term (current) use of anticoagulants: Secondary | ICD-10-CM | POA: Insufficient documentation

## 2018-06-07 DIAGNOSIS — I252 Old myocardial infarction: Secondary | ICD-10-CM | POA: Insufficient documentation

## 2018-06-07 DIAGNOSIS — R319 Hematuria, unspecified: Secondary | ICD-10-CM | POA: Diagnosis not present

## 2018-06-07 DIAGNOSIS — R0602 Shortness of breath: Secondary | ICD-10-CM | POA: Diagnosis not present

## 2018-06-07 DIAGNOSIS — I10 Essential (primary) hypertension: Secondary | ICD-10-CM | POA: Insufficient documentation

## 2018-06-07 DIAGNOSIS — R079 Chest pain, unspecified: Secondary | ICD-10-CM | POA: Diagnosis not present

## 2018-06-07 LAB — CBC WITH DIFFERENTIAL (CANCER CENTER ONLY)
BASOS PCT: 0 %
Basophils Absolute: 0 10*3/uL (ref 0.0–0.1)
EOS ABS: 0.1 10*3/uL (ref 0.0–0.5)
EOS PCT: 1 %
HCT: 37.2 % — ABNORMAL LOW (ref 38.4–49.9)
HEMOGLOBIN: 12 g/dL — AB (ref 13.0–17.1)
LYMPHS ABS: 1.5 10*3/uL (ref 0.9–3.3)
Lymphocytes Relative: 26 %
MCH: 31.4 pg (ref 27.2–33.4)
MCHC: 32.3 g/dL (ref 32.0–36.0)
MCV: 97.4 fL (ref 79.3–98.0)
MONOS PCT: 11 %
Monocytes Absolute: 0.6 10*3/uL (ref 0.1–0.9)
NEUTROS PCT: 62 %
Neutro Abs: 3.6 10*3/uL (ref 1.5–6.5)
PLATELETS: 180 10*3/uL (ref 140–400)
RBC: 3.82 MIL/uL — AB (ref 4.20–5.82)
RDW: 12.7 % (ref 11.0–14.6)
WBC: 5.8 10*3/uL (ref 4.0–10.3)

## 2018-06-07 LAB — CMP (CANCER CENTER ONLY)
ALBUMIN: 3.7 g/dL (ref 3.5–5.0)
ALT: 32 U/L (ref 0–44)
ANION GAP: 12 (ref 5–15)
AST: 30 U/L (ref 15–41)
Alkaline Phosphatase: 64 U/L (ref 38–126)
BUN: 10 mg/dL (ref 6–20)
CO2: 27 mmol/L (ref 22–32)
Calcium: 9.7 mg/dL (ref 8.9–10.3)
Chloride: 105 mmol/L (ref 98–111)
Creatinine: 1.25 mg/dL — ABNORMAL HIGH (ref 0.61–1.24)
GFR, Estimated: >60 mL/min (ref >60)
GLUCOSE: 159 mg/dL — AB (ref 70–99)
Potassium: 3.6 mmol/L (ref 3.5–5.1)
SODIUM: 144 mmol/L (ref 135–145)
Total Bilirubin: 0.5 mg/dL (ref 0.3–1.2)
Total Protein: 7.6 g/dL (ref 6.5–8.1)

## 2018-06-07 MED ORDER — DEXAMETHASONE SODIUM PHOSPHATE 10 MG/ML IJ SOLN
INTRAMUSCULAR | Status: AC
  Filled 2018-06-07: qty 1

## 2018-06-07 MED ORDER — DEXAMETHASONE SODIUM PHOSPHATE 10 MG/ML IJ SOLN
10.0000 mg | Freq: Once | INTRAMUSCULAR | Status: AC
  Administered 2018-06-07: 10 mg via INTRAVENOUS

## 2018-06-07 MED ORDER — SODIUM CHLORIDE 0.9 % IV SOLN
Freq: Once | INTRAVENOUS | Status: AC
  Administered 2018-06-07: 12:00:00 via INTRAVENOUS
  Filled 2018-06-07: qty 250

## 2018-06-07 MED ORDER — SODIUM CHLORIDE 0.9 % IV SOLN
100.0000 mg/m2 | Freq: Once | INTRAVENOUS | Status: AC
  Administered 2018-06-07: 220 mg via INTRAVENOUS
  Filled 2018-06-07: qty 22

## 2018-06-07 MED ORDER — SODIUM CHLORIDE 0.9 % IV SOLN
2000.0000 mg | Freq: Once | INTRAVENOUS | Status: AC
  Administered 2018-06-07: 2000 mg via INTRAVENOUS
  Filled 2018-06-07: qty 52.6

## 2018-06-07 NOTE — Patient Instructions (Signed)
[facility] Discharge Instructions for Patients Receiving Chemotherapy  Today you received the following chemotherapy agents Gemzar, Taxotere  To help prevent nausea and vomiting after your treatment, we encourage you to take your nausea medication as directed   If you develop nausea and vomiting that is not controlled by your nausea medication, call the clinic.   BELOW ARE SYMPTOMS THAT SHOULD BE REPORTED IMMEDIATELY:  *FEVER GREATER THAN 100.5 F  *CHILLS WITH OR WITHOUT FEVER  NAUSEA AND VOMITING THAT IS NOT CONTROLLED WITH YOUR NAUSEA MEDICATION  *UNUSUAL SHORTNESS OF BREATH  *UNUSUAL BRUISING OR BLEEDING  TENDERNESS IN MOUTH AND THROAT WITH OR WITHOUT PRESENCE OF ULCERS  *URINARY PROBLEMS  *BOWEL PROBLEMS  UNUSUAL RASH Items with * indicate a potential emergency and should be followed up as soon as possible.  Feel free to call the clinic should you have any questions or concerns. The clinic phone number is [phone].  Please show the [place] at check-in to the Emergency Department and triage nurse.   Docetaxel injection What is this medicine? DOCETAXEL (doe se TAX el) is a chemotherapy drug. It targets fast dividing cells, like cancer cells, and causes these cells to die. This medicine is used to treat many types of cancers like breast cancer, certain stomach cancers, head and neck cancer, lung cancer, and prostate cancer. This medicine may be used for other purposes; ask your health care provider or pharmacist if you have questions. COMMON BRAND NAME(S): Docefrez, Taxotere What should I tell my health care provider before I take this medicine? They need to know if you have any of these conditions: -infection (especially a virus infection such as chickenpox, cold sores, or herpes) -liver disease -low blood counts, like low white cell, platelet, or red cell counts -an unusual or allergic reaction to docetaxel, polysorbate 80, other  chemotherapy agents, other medicines, foods, dyes, or preservatives -pregnant or trying to get pregnant -breast-feeding How should I use this medicine? This drug is given as an infusion into a vein. It is administered in a hospital or clinic by a specially trained health care professional. Talk to your pediatrician regarding the use of this medicine in children. Special care may be needed. Overdosage: If you think you have taken too much of this medicine contact a poison control center or emergency room at once. NOTE: This medicine is only for you. Do not share this medicine with others. What if I miss a dose? It is important not to miss your dose. Call your doctor or health care professional if you are unable to keep an appointment. What may interact with this medicine? -cyclosporine -erythromycin -ketoconazole -medicines to increase blood counts like filgrastim, pegfilgrastim, sargramostim -vaccines Talk to your doctor or health care professional before taking any of these medicines: -acetaminophen -aspirin -ibuprofen -ketoprofen -naproxen This list may not describe all possible interactions. Give your health care provider a list of all the medicines, herbs, non-prescription drugs, or dietary supplements you use. Also tell them if you smoke, drink alcohol, or use illegal drugs. Some items may interact with your medicine. What should I watch for while using this medicine? Your condition will be monitored carefully while you are receiving this medicine. You will need important blood work done while you are taking this medicine. This drug may make you feel generally unwell. This is not uncommon, as chemotherapy can affect healthy cells as well as cancer cells. Report any side effects. Continue your course of treatment even though you feel  ill unless your doctor tells you to stop. In some cases, you may be given additional medicines to help with side effects. Follow all directions for their  use. Call your doctor or health care professional for advice if you get a fever, chills or sore throat, or other symptoms of a cold or flu. Do not treat yourself. This drug decreases your body's ability to fight infections. Try to avoid being around people who are sick. This medicine may increase your risk to bruise or bleed. Call your doctor or health care professional if you notice any unusual bleeding. This medicine may contain alcohol in the product. You may get drowsy or dizzy. Do not drive, use machinery, or do anything that needs mental alertness until you know how this medicine affects you. Do not stand or sit up quickly, especially if you are an older patient. This reduces the risk of dizzy or fainting spells. Avoid alcoholic drinks. Do not become pregnant while taking this medicine. Women should inform their doctor if they wish to become pregnant or think they might be pregnant. There is a potential for serious side effects to an unborn child. Talk to your health care professional or pharmacist for more information. Do not breast-feed an infant while taking this medicine. What side effects may I notice from receiving this medicine? Side effects that you should report to your doctor or health care professional as soon as possible: -allergic reactions like skin rash, itching or hives, swelling of the face, lips, or tongue -low blood counts - This drug may decrease the number of white blood cells, red blood cells and platelets. You may be at increased risk for infections and bleeding. -signs of infection - fever or chills, cough, sore throat, pain or difficulty passing urine -signs of decreased platelets or bleeding - bruising, pinpoint red spots on the skin, black, tarry stools, nosebleeds -signs of decreased red blood cells - unusually weak or tired, fainting spells, lightheadedness -breathing problems -fast or irregular heartbeat -low blood pressure -mouth sores -nausea and vomiting -pain,  swelling, redness or irritation at the injection site -pain, tingling, numbness in the hands or feet -swelling of the ankle, feet, hands -weight gain Side effects that usually do not require medical attention (report to your doctor or health care professional if they continue or are bothersome): -bone pain -complete hair loss including hair on your head, underarms, pubic hair, eyebrows, and eyelashes -diarrhea -excessive tearing -changes in the color of fingernails -loosening of the fingernails -nausea -muscle pain -red flush to skin -sweating -weak or tired This list may not describe all possible side effects. Call your doctor for medical advice about side effects. You may report side effects to FDA at 1-800-FDA-1088. Where should I keep my medicine? This drug is given in a hospital or clinic and will not be stored at home. NOTE: This sheet is a summary. It may not cover all possible information. If you have questions about this medicine, talk to your doctor, pharmacist, or health care provider.  2018 Elsevier/Gold Standard (2015-09-25 12:32:56)

## 2018-06-07 NOTE — Progress Notes (Unsigned)
Pt's lab today were drawn peripherly, pt has no Port-A-Cath at this time.

## 2018-06-08 ENCOUNTER — Telehealth: Payer: Self-pay | Admitting: *Deleted

## 2018-06-08 NOTE — Telephone Encounter (Signed)
Returned wife's phone call regarding a low grade fever and swelling in bilateral lower legs. Instructed her to call [phone] to discuss above issues.

## 2018-06-09 ENCOUNTER — Inpatient Hospital Stay: Payer: BLUE CROSS/BLUE SHIELD

## 2018-06-09 DIAGNOSIS — C494 Malignant neoplasm of connective and soft tissue of abdomen: Secondary | ICD-10-CM

## 2018-06-09 MED ORDER — PEGFILGRASTIM-CBQV 6 MG/0.6ML ~~LOC~~ SOSY
6.0000 mg | PREFILLED_SYRINGE | Freq: Once | SUBCUTANEOUS | Status: AC
  Administered 2018-06-09: 6 mg via SUBCUTANEOUS

## 2018-06-09 NOTE — Patient Instructions (Signed)
Pegfilgrastim injection What is this medicine? PEGFILGRASTIM (PEG fil gra stim) is a long-acting granulocyte colony-stimulating factor that stimulates the growth of neutrophils, a type of white blood cell important in the body's fight against infection. It is used to reduce the incidence of fever and infection in patients with certain types of cancer who are receiving chemotherapy that affects the bone marrow, and to increase survival after being exposed to high doses of radiation. This medicine may be used for other purposes; ask your health care provider or pharmacist if you have questions. COMMON BRAND NAME(S): Neulasta What should I tell my health care provider before I take this medicine? They need to know if you have any of these conditions: -kidney disease -latex allergy -ongoing radiation therapy -sickle cell disease -skin reactions to acrylic adhesives (On-Body Injector only) -an unusual or allergic reaction to pegfilgrastim, filgrastim, other medicines, foods, dyes, or preservatives -pregnant or trying to get pregnant -breast-feeding How should I use this medicine? This medicine is for injection under the skin. If you get this medicine at home, you will be taught how to prepare and give the pre-filled syringe or how to use the On-body Injector. Refer to the patient Instructions for Use for detailed instructions. Use exactly as directed. Tell your healthcare provider immediately if you suspect that the On-body Injector may not have performed as intended or if you suspect the use of the On-body Injector resulted in a missed or partial dose. It is important that you put your used needles and syringes in a special sharps container. Do not put them in a trash can. If you do not have a sharps container, call your pharmacist or healthcare provider to get one. Talk to your pediatrician regarding the use of this medicine in children. While this drug may be prescribed for selected conditions,  precautions do apply. Overdosage: If you think you have taken too much of this medicine contact a poison control center or emergency room at once. NOTE: This medicine is only for you. Do not share this medicine with others. What if I miss a dose? It is important not to miss your dose. Call your doctor or health care professional if you miss your dose. If you miss a dose due to an On-body Injector failure or leakage, a new dose should be administered as soon as possible using a single prefilled syringe for manual use. What may interact with this medicine? Interactions have not been studied. Give your health care provider a list of all the medicines, herbs, non-prescription drugs, or dietary supplements you use. Also tell them if you smoke, drink alcohol, or use illegal drugs. Some items may interact with your medicine. This list may not describe all possible interactions. Give your health care provider a list of all the medicines, herbs, non-prescription drugs, or dietary supplements you use. Also tell them if you smoke, drink alcohol, or use illegal drugs. Some items may interact with your medicine. What should I watch for while using this medicine? You may need blood work done while you are taking this medicine. If you are going to need a MRI, CT scan, or other procedure, tell your doctor that you are using this medicine (On-Body Injector only). What side effects may I notice from receiving this medicine? Side effects that you should report to your doctor or health care professional as soon as possible: -allergic reactions like skin rash, itching or hives, swelling of the face, lips, or tongue -dizziness -fever -pain, redness, or irritation at site   where injected -pinpoint red spots on the skin -red or dark-brown urine -shortness of breath or breathing problems -stomach or side pain, or pain at the shoulder -swelling -tiredness -trouble passing urine or change in the amount of urine Side  effects that usually do not require medical attention (report to your doctor or health care professional if they continue or are bothersome): -bone pain -muscle pain This list may not describe all possible side effects. Call your doctor for medical advice about side effects. You may report side effects to FDA at 1-800-FDA-1088. Where should I keep my medicine? Keep out of the reach of children. Store pre-filled syringes in a refrigerator between 2 and 8 degrees C (36 and 46 degrees F). Do not freeze. Keep in carton to protect from light. Throw away this medicine if it is left out of the refrigerator for more than 48 hours. Throw away any unused medicine after the expiration date. NOTE: This sheet is a summary. It may not cover all possible information. If you have questions about this medicine, talk to your doctor, pharmacist, or health care provider.  2018 Elsevier/Gold Standard (2016-08-19 12:58:03)  

## 2018-06-12 ENCOUNTER — Inpatient Hospital Stay: Payer: BLUE CROSS/BLUE SHIELD

## 2018-06-12 ENCOUNTER — Ambulatory Visit: Payer: BLUE CROSS/BLUE SHIELD

## 2018-06-12 ENCOUNTER — Other Ambulatory Visit: Payer: BLUE CROSS/BLUE SHIELD

## 2018-06-12 ENCOUNTER — Inpatient Hospital Stay (HOSPITAL_BASED_OUTPATIENT_CLINIC_OR_DEPARTMENT_OTHER): Payer: BLUE CROSS/BLUE SHIELD | Admitting: Oncology

## 2018-06-12 ENCOUNTER — Telehealth: Payer: Self-pay | Admitting: Oncology

## 2018-06-12 VITALS — BP 130/82 | HR 103 | Temp 98.8°F | Resp 18 | Ht 71.0 in | Wt 208.1 lb

## 2018-06-12 DIAGNOSIS — C61 Malignant neoplasm of prostate: Secondary | ICD-10-CM | POA: Diagnosis not present

## 2018-06-12 DIAGNOSIS — C494 Malignant neoplasm of connective and soft tissue of abdomen: Secondary | ICD-10-CM

## 2018-06-12 DIAGNOSIS — R52 Pain, unspecified: Secondary | ICD-10-CM | POA: Diagnosis not present

## 2018-06-12 DIAGNOSIS — R339 Retention of urine, unspecified: Secondary | ICD-10-CM | POA: Diagnosis not present

## 2018-06-12 LAB — CMP (CANCER CENTER ONLY)
ALBUMIN: 3.8 g/dL (ref 3.5–5.0)
ALK PHOS: 91 U/L (ref 38–126)
ALT: 90 U/L — AB (ref 0–44)
AST: 53 U/L — AB (ref 15–41)
Anion gap: 10 (ref 5–15)
BUN: 12 mg/dL (ref 6–20)
CHLORIDE: 104 mmol/L (ref 98–111)
CO2: 30 mmol/L (ref 22–32)
CREATININE: 1.19 mg/dL (ref 0.61–1.24)
Calcium: 9.6 mg/dL (ref 8.9–10.3)
GFR, Estimated: >60 mL/min (ref >60)
GLUCOSE: 116 mg/dL — AB (ref 70–99)
Potassium: 4 mmol/L (ref 3.5–5.1)
SODIUM: 144 mmol/L (ref 135–145)
Total Bilirubin: 0.6 mg/dL (ref 0.3–1.2)
Total Protein: 7.5 g/dL (ref 6.5–8.1)

## 2018-06-12 LAB — CBC WITH DIFFERENTIAL (CANCER CENTER ONLY)
Basophils Absolute: 0.1 10*3/uL (ref 0.0–0.1)
Basophils Relative: 1 %
EOS ABS: 0.3 10*3/uL (ref 0.0–0.5)
Eosinophils Relative: 4 %
HCT: 34.2 % — ABNORMAL LOW (ref 38.4–49.9)
Hemoglobin: 11.1 g/dL — ABNORMAL LOW (ref 13.0–17.1)
LYMPHS ABS: 1.3 10*3/uL (ref 0.9–3.3)
Lymphocytes Relative: 18 %
MCH: 31.6 pg (ref 27.2–33.4)
MCHC: 32.5 g/dL (ref 32.0–36.0)
MCV: 97.2 fL (ref 79.3–98.0)
MONO ABS: 0.4 10*3/uL (ref 0.1–0.9)
MONOS PCT: 6 %
NEUTROS PCT: 71 %
Neutro Abs: 5.2 10*3/uL (ref 1.5–6.5)
PLATELETS: 141 10*3/uL (ref 140–400)
RBC: 3.52 MIL/uL — ABNORMAL LOW (ref 4.20–5.82)
RDW: 13.8 % (ref 11.0–14.6)
WBC Count: 7.3 10*3/uL (ref 4.0–10.3)

## 2018-06-12 MED ORDER — OXYCODONE HCL 5 MG PO TABS: 5.0000 mg | ORAL_TABLET | ORAL | 0 refills | Status: DC | PRN

## 2018-06-12 NOTE — Progress Notes (Signed)
Hematology and Oncology Follow Up Visit  Dylan Gonzalez [id number] [birth year] 52 y.o. 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  m.  Prior Therapy: He is S/P transrectal prostate ultrasound and a prostate biopsy as well as a cystoscopy with insertion of double-J stent and transurethral resection of the prostate completed by Dr. Jeffie Pollock on April 13, 2018.  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  Immunohistochemical stains showed positive for desmin, smooth muscle actin, muscle specific actin and negative for PSA.  He was also negative for cytokeratin 7, 903, CD117 and CD34.     Current therapy: Taxotere with gemcitabine started on 05/30/2018.  He completed the first cycle of therapy.  Interim History: Mr. Beckles returns today for a follow-up.  Since her last visit, he completed the first cycle of chemotherapy without any major complications.  He denies any nausea, vomiting or excessive fatigue.  He denies any infusion related complications.  He denies any worsening neuropathy or alteration of his mental status.  He continues to have issues with pelvic pain and oxycodone have helped his pain at this time.  He did take this medication as needed about 2-3 times a day.  He still has a urinary catheter in place without any discomfort.  He is able to sleep better at this time.  He does not report any headaches, blurry vision, syncope or seizures.  He denies any confusion.  Does not report any fevers, chills or sweats.  Does not report any cough, wheezing or hemoptysis.  Does not report any chest pain, palpitation, orthopnea or leg edema.  Does not report any nausea, vomiting or abdominal pain.  Does not report any change in his bowel habits.  Denies any  arthralgias or myalgias.   Does not report frequency, urgency or hematuria.  Does not report any skin rashes or lesions. Does not report any easy bruising or bleeding tendency.  Does not report any lymphadenopathy or petechiae.  Does not report any mood changes.  Remaining review of systems is negative.    Medications: I have reviewed the patient's current medications.  Current Outpatient Medications  Medication Sig Dispense Refill  . amLODipine (NORVASC) 10 MG tablet Take 1 tablet (10 mg total) by mouth daily. 30 tablet 1  . belladonna-opium (B&O SUPPRETTES) 16.2-30 MG suppository Place 1 suppository (30 mg total) rectally every 8 (eight) hours as needed for pain. 20 suppository 0  . cefUROXime (CEFTIN) 250 MG tablet Take 1 tablet (250 mg total) by mouth 2 (two) times daily with a meal. 20 tablet 0  . cetirizine (ZYRTEC) 10 MG tablet Take 10 mg by mouth daily as needed for allergies.    . fluticasone (FLONASE) 50 MCG/ACT nasal spray PLACE 2 SPRAYS INTO BOTH NOSTRILS DAILY. (Patient not taking: Reported on 04/13/2018) 16 g 9  . HYDROcodone-acetaminophen (NORCO/VICODIN) 5-325 MG tablet Take 1 tablet by mouth every 6 (six) hours as needed for moderate pain. 60 tablet 0  . hydrocortisone cream 1 % Apply to affected area 2 times daily (Patient not taking: Reported on 04/13/2018) 15 g 0  . hydrOXYzine (VISTARIL) 100 MG capsule TAKE ONE CAPSULE BY MOUTH AT BEDTIME AS NEEDED FOR ALLERGY (Patient taking differently: Take 100 mg by mouth at bedtime as needed (allergies). ) 30 capsule 5  .  Hyoscyamine Sulfate SL (LEVSIN/SL) 0.125 MG SUBL Place 0.125 mg under the tongue every 8 (eight) hours as needed. (Patient taking differently: Place 0.125 mg under the tongue every 8 (eight) hours as needed (cramping). ) 120 each 0  . metoprolol tartrate (LOPRESSOR) 50 MG tablet Take 1 tablet (50 mg total) by mouth 2 (two) times daily. 60 tablet 1  . Multiple Vitamin (MULTIVITAMIN) tablet Take 1 tablet by mouth daily.    Marland Kitchen  oxyCODONE (OXY IR/ROXICODONE) 5 MG immediate release tablet Take 1-2 tablets (5-10 mg total) by mouth every 4 (four) hours as needed for severe pain. 60 tablet 0  . phenazopyridine (PYRIDIUM) 200 MG tablet Take 1 tablet (200 mg total) by mouth 3 (three) times daily as needed (burning). (Patient not taking: Reported on 04/23/2018) 30 tablet 0  . polyethylene glycol (MIRALAX / GLYCOLAX) packet Take 17 g by mouth 2 (two) times daily as needed for mild constipation or moderate constipation. 14 each 0  . prochlorperazine (COMPAZINE) 10 MG tablet Take 1 tablet (10 mg total) by mouth every 6 (six) hours as needed for nausea or vomiting. 30 tablet 0   No current facility-administered medications for this visit.      Allergies:  Allergies  Allergen Reactions  . Pollen Extract Other (See Comments)    Sneezing and shortness of breath    Past Medical History, Surgical history, Social history, and Family History were reviewed and updated.    Physical Exam: Blood pressure 130/82, pulse (!) 103, temperature 98.8 F (37.1 C), temperature source Oral, resp. rate 18, height 5' 11"  (1.803 m), weight 208 lb 1.6 oz (94.4 kg), SpO2 96 %.    ECOG: 1   General appearance: Comfortable appearing without any discomfort Head: Normocephalic without any trauma Oropharynx: Mucous membranes are moist and pink without any thrush or ulcers. Eyes: Pupils are equal and round reactive to light. Lymph nodes: No cervical, supraclavicular, inguinal or axillary lymphadenopathy.   Heart:regular rate and rhythm.  S1 and S2 without leg edema. Lung: Clear without any rhonchi or wheezes.  No dullness to percussion. Abdomin: Soft, nontender, nondistended with good bowel sounds.  No hepatosplenomegaly. Musculoskeletal: No joint deformity or effusion.  Full range of motion noted. Neurological: No deficits noted on motor, sensory and deep tendon reflex exam. Skin: No petechial rash or dryness.  Appeared moist.      Lab  Results: Lab Results  Component Value Date   WBC 5.8 06/07/2018   HGB 12.0 (L) 06/07/2018   HCT 37.2 (L) 06/07/2018   MCV 97.4 06/07/2018   PLT 180 06/07/2018     Chemistry      Component Value Date/Time   NA 144 06/07/2018 1039   K 3.6 06/07/2018 1039   CL 105 06/07/2018 1039   CO2 27 06/07/2018 1039   BUN 10 06/07/2018 1039   CREATININE 1.25 (H) 06/07/2018 1039   CREATININE 1.07 06/01/2014 1257      Component Value Date/Time   CALCIUM 9.7 06/07/2018 1039   ALKPHOS 64 06/07/2018 1039   AST 30 06/07/2018 1039   ALT 32 06/07/2018 1039   BILITOT 0.5 06/07/2018 1039         Impression and Plan:  52 year old man with:  1.  Leiomyosarcoma arising from the prostate diagnosed in August 2019 who presented with a large pelvic mass found to have a high-grade variant.  He has a pulmonary metastasis suspected.  He is currently receiving neoadjuvant chemotherapy utilizing Taxotere and gemcitabine without any complications.  The plan  is to complete 3 cycles of therapy before repeating imaging studies.  If he has a excellent response to therapy without any systemic disease, consideration for resection would be considered.  After discussion today, he is agreeable to proceed.  2.  Urinary retention: Foley catheter remains in place.  3.  Pain: He has been prescribed oxycodone.  This medication has been effective in controlling his pain which was refilled for him today.  4. Antiemetics: Compazine has been available to him at this time.  No nausea or vomiting reported at this time.  5.  IV access: Peripheral veins have been used for the time being and a Port-A-Cath will be inserted in the near future.  5.  Prognosis: Treatment is palliative at this time unlikely to be cured.  His performance status is excellent and aggressive therapy is recommended.  6.  Follow-up: In 1 week to start cycle 2 of chemotherapy.  25  minutes was spent with the patient face-to-face today.  More than  50% of time was dedicated to reviewing the natural course of his disease, treatment options and coordinating plan of care.     [name], MD 10/7/201911:18 AM

## 2018-06-12 NOTE — Telephone Encounter (Signed)
appts scheduled avs/calendar printed per 10/7 los

## 2018-06-13 ENCOUNTER — Other Ambulatory Visit: Payer: Self-pay | Admitting: Student

## 2018-06-13 ENCOUNTER — Telehealth: Payer: Self-pay | Admitting: *Deleted

## 2018-06-13 NOTE — Telephone Encounter (Signed)
Patient calling to say he has an appt for a port insertion in IR tomorrow. States he does not have transportation and asking for a Lucianne Lei or taxi to be provided. Per IR he must have a friend or family member to accompany him, d/t sedation. Patient given # to IR to r/s when he can arrange transportation.

## 2018-06-14 ENCOUNTER — Ambulatory Visit (HOSPITAL_COMMUNITY): Payer: BLUE CROSS/BLUE SHIELD

## 2018-06-14 ENCOUNTER — Other Ambulatory Visit (HOSPITAL_COMMUNITY): Payer: BLUE CROSS/BLUE SHIELD

## 2018-06-14 DIAGNOSIS — R338 Other retention of urine: Secondary | ICD-10-CM | POA: Diagnosis not present

## 2018-06-14 DIAGNOSIS — C495 Malignant neoplasm of connective and soft tissue of pelvis: Secondary | ICD-10-CM | POA: Diagnosis not present

## 2018-06-14 DIAGNOSIS — R300 Dysuria: Secondary | ICD-10-CM | POA: Diagnosis not present

## 2018-06-19 ENCOUNTER — Other Ambulatory Visit: Payer: BLUE CROSS/BLUE SHIELD

## 2018-06-19 ENCOUNTER — Inpatient Hospital Stay: Payer: BLUE CROSS/BLUE SHIELD

## 2018-06-20 ENCOUNTER — Ambulatory Visit: Payer: BLUE CROSS/BLUE SHIELD

## 2018-06-20 ENCOUNTER — Other Ambulatory Visit: Payer: BLUE CROSS/BLUE SHIELD

## 2018-06-20 ENCOUNTER — Other Ambulatory Visit: Payer: Self-pay | Admitting: Physician Assistant

## 2018-06-21 ENCOUNTER — Ambulatory Visit (HOSPITAL_COMMUNITY)
Admission: RE | Admit: 2018-06-21 | Discharge: 2018-06-21 | Disposition: A | Discharge status: Home / Self Care | Payer: BLUE CROSS/BLUE SHIELD | Source: Ambulatory Visit | Attending: Oncology | Admitting: Oncology

## 2018-06-21 ENCOUNTER — Encounter (HOSPITAL_COMMUNITY): Payer: Self-pay

## 2018-06-21 ENCOUNTER — Other Ambulatory Visit: Payer: Self-pay

## 2018-06-21 DIAGNOSIS — Z7951 Long term (current) use of inhaled steroids: Secondary | ICD-10-CM | POA: Diagnosis not present

## 2018-06-21 DIAGNOSIS — Z9109 Other allergy status, other than to drugs and biological substances: Secondary | ICD-10-CM | POA: Insufficient documentation

## 2018-06-21 DIAGNOSIS — C7800 Secondary malignant neoplasm of unspecified lung: Secondary | ICD-10-CM

## 2018-06-21 DIAGNOSIS — Z9889 Other specified postprocedural states: Secondary | ICD-10-CM | POA: Insufficient documentation

## 2018-06-21 DIAGNOSIS — C494 Malignant neoplasm of connective and soft tissue of abdomen: Secondary | ICD-10-CM | POA: Insufficient documentation

## 2018-06-21 DIAGNOSIS — Z960 Presence of urogenital implants: Secondary | ICD-10-CM | POA: Insufficient documentation

## 2018-06-21 DIAGNOSIS — Z79899 Other long term (current) drug therapy: Secondary | ICD-10-CM | POA: Diagnosis not present

## 2018-06-21 DIAGNOSIS — Z8249 Family history of ischemic heart disease and other diseases of the circulatory system: Secondary | ICD-10-CM | POA: Diagnosis not present

## 2018-06-21 DIAGNOSIS — Z95828 Presence of other vascular implants and grafts: Secondary | ICD-10-CM

## 2018-06-21 DIAGNOSIS — C7989 Secondary malignant neoplasm of other specified sites: Secondary | ICD-10-CM | POA: Diagnosis not present

## 2018-06-21 DIAGNOSIS — I252 Old myocardial infarction: Secondary | ICD-10-CM | POA: Diagnosis not present

## 2018-06-21 DIAGNOSIS — I10 Essential (primary) hypertension: Secondary | ICD-10-CM | POA: Insufficient documentation

## 2018-06-21 DIAGNOSIS — C61 Malignant neoplasm of prostate: Secondary | ICD-10-CM | POA: Diagnosis not present

## 2018-06-21 DIAGNOSIS — Z452 Encounter for adjustment and management of vascular access device: Secondary | ICD-10-CM | POA: Diagnosis not present

## 2018-06-21 HISTORY — DX: Presence of other vascular implants and grafts: Z95.828

## 2018-06-21 HISTORY — PX: IR IMAGING GUIDED PORT INSERTION: IMG5740

## 2018-06-21 LAB — CBC WITH DIFFERENTIAL/PLATELET
Abs Immature Granulocytes: 0.92 10*3/uL — ABNORMAL HIGH (ref 0.00–0.07)
BASOS PCT: 1 %
Basophils Absolute: 0.1 10*3/uL (ref 0.0–0.1)
EOS PCT: 0 %
Eosinophils Absolute: 0.1 10*3/uL (ref 0.0–0.5)
HCT: 34.9 % — ABNORMAL LOW (ref 39.0–52.0)
HEMOGLOBIN: 10.7 g/dL — AB (ref 13.0–17.0)
Immature Granulocytes: 6 %
Lymphocytes Relative: 14 %
Lymphs Abs: 2.4 10*3/uL (ref 0.7–4.0)
MCH: 31 pg (ref 26.0–34.0)
MCHC: 30.7 g/dL (ref 30.0–36.0)
MCV: 101.2 fL — AB (ref 80.0–100.0)
MONO ABS: 1.5 10*3/uL — AB (ref 0.1–1.0)
MONOS PCT: 9 %
Neutro Abs: 11.5 10*3/uL — ABNORMAL HIGH (ref 1.7–7.7)
Neutrophils Relative %: 70 %
PLATELETS: 271 10*3/uL (ref 150–400)
RBC: 3.45 MIL/uL — AB (ref 4.22–5.81)
RDW: 14.7 % (ref 11.5–15.5)
WBC: 16.4 10*3/uL — AB (ref 4.0–10.5)
nRBC: 3.1 % — ABNORMAL HIGH (ref 0.0–0.2)

## 2018-06-21 LAB — PROTIME-INR
INR: 0.92
PROTHROMBIN TIME: 12.3 s (ref 11.4–15.2)

## 2018-06-21 LAB — APTT: APTT: 29 s (ref 24–36)

## 2018-06-21 LAB — BASIC METABOLIC PANEL
Anion gap: 13 (ref 5–15)
BUN: 14 mg/dL (ref 6–20)
CO2: 28 mmol/L (ref 22–32)
CREATININE: 1.16 mg/dL (ref 0.61–1.24)
Calcium: 9.6 mg/dL (ref 8.9–10.3)
Chloride: 105 mmol/L (ref 98–111)
GFR calc Af Amer: >60 mL/min (ref >60)
GLUCOSE: 122 mg/dL — AB (ref 70–99)
Potassium: 3.5 mmol/L (ref 3.5–5.1)
SODIUM: 146 mmol/L — AB (ref 135–145)

## 2018-06-21 MED ORDER — CEFAZOLIN SODIUM-DEXTROSE 2-4 GM/100ML-% IV SOLN
INTRAVENOUS | Status: AC
  Administered 2018-06-21: 2 g via INTRAVENOUS
  Filled 2018-06-21: qty 100

## 2018-06-21 MED ORDER — LIDOCAINE-EPINEPHRINE (PF) 1 %-1:200000 IJ SOLN
INTRAMUSCULAR | Status: AC | PRN
  Administered 2018-06-21: 10 mL

## 2018-06-21 MED ORDER — CEFAZOLIN SODIUM-DEXTROSE 2-4 GM/100ML-% IV SOLN
2.0000 g | INTRAVENOUS | Status: AC
  Administered 2018-06-21: 2 g via INTRAVENOUS

## 2018-06-21 MED ORDER — FENTANYL CITRATE (PF) 100 MCG/2ML IJ SOLN
INTRAMUSCULAR | Status: AC | PRN
  Administered 2018-06-21 (×2): 50 ug via INTRAVENOUS

## 2018-06-21 MED ORDER — HEPARIN SOD (PORK) LOCK FLUSH 100 UNIT/ML IV SOLN
INTRAVENOUS | Status: AC | PRN
  Administered 2018-06-21: 500 [IU] via INTRAVENOUS

## 2018-06-21 MED ORDER — FENTANYL CITRATE (PF) 100 MCG/2ML IJ SOLN
INTRAMUSCULAR | Status: AC
  Filled 2018-06-21: qty 2

## 2018-06-21 MED ORDER — LIDOCAINE HCL 1 % IJ SOLN
INTRAMUSCULAR | Status: AC
  Filled 2018-06-21: qty 20

## 2018-06-21 MED ORDER — MIDAZOLAM HCL 2 MG/2ML IJ SOLN
INTRAMUSCULAR | Status: AC
  Filled 2018-06-21: qty 4

## 2018-06-21 MED ORDER — MIDAZOLAM HCL 2 MG/2ML IJ SOLN
INTRAMUSCULAR | Status: AC | PRN
  Administered 2018-06-21 (×2): 1 mg via INTRAVENOUS

## 2018-06-21 MED ORDER — LIDOCAINE-EPINEPHRINE (PF) 2 %-1:200000 IJ SOLN
INTRAMUSCULAR | Status: AC
  Filled 2018-06-21: qty 20

## 2018-06-21 MED ORDER — SODIUM CHLORIDE 0.9 % IV SOLN: INTRAVENOUS | Status: DC

## 2018-06-21 MED ORDER — SODIUM CHLORIDE 0.9 % IV SOLN
INTRAVENOUS | Status: DC
  Administered 2018-06-21: 12:00:00 via INTRAVENOUS

## 2018-06-21 MED ORDER — CEFAZOLIN SODIUM-DEXTROSE 2-4 GM/100ML-% IV SOLN: 2.0000 g | Freq: Once | INTRAVENOUS | Status: AC

## 2018-06-21 MED ORDER — LIDOCAINE HCL (PF) 1 % IJ SOLN
INTRAMUSCULAR | Status: AC | PRN
  Administered 2018-06-21: 10 mL via SUBCUTANEOUS

## 2018-06-21 MED ORDER — HEPARIN SOD (PORK) LOCK FLUSH 100 UNIT/ML IV SOLN
INTRAVENOUS | Status: AC
  Filled 2018-06-21: qty 5

## 2018-06-21 NOTE — Procedures (Signed)
Placement of right IJ port.  Tip at SVC/RA junction.  Minimal blood loss and no immediate complication.  See full report in Imaging.

## 2018-06-21 NOTE — Sedation Documentation (Signed)
Patient is resting comfortably with eyes closed in NAD. 

## 2018-06-21 NOTE — H&P (Signed)
Chief Complaint: Patient was seen in consultation today for port placement at the request of Dylan Gonzalez,Dylan Gonzalez  Referring Physician(s): Dylan Gonzalez  Supervising Physician: Dylan Gonzalez  Patient Status: [facility] - Out-pt  History of Present Illness: Dylan Gonzalez is a 52 y.o. 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 He now has metastatic disease. Has been receiving treatment via PIV but access has become an issue. He is referred for port placement. PMHx, meds, labs, imaging, allergies reviewed. Feels well, no recent fevers, chills, illness. Has been NPO today as directed. Family at bedside.    Past Medical History:  Diagnosis Date  . Hypertension   . MI (myocardial infarction) ([place])    2007    Past Surgical History:  Procedure Laterality Date  . CYSTOSCOPY W/ URETERAL STENT PLACEMENT Bilateral 04/13/2018   Procedure: CYSTOSCOPY WITH BILATERAL STENT REPLACEMENT;  Surgeon: Dylan Seal, MD;  Location: [facility];  Service: Urology;  Laterality: Bilateral;  . eye removed    . HERNIA REPAIR    . INTRAOCULAR PROSTHESES INSERTION    . PROSTATE BIOPSY Gonzalez/A 04/13/2018   Procedure: ULTRASOUND GUIDED PROSTATE BIOPSY;  Surgeon: Dylan Seal, MD;  Location: [facility];  Service: Urology;  Laterality: Gonzalez/A;  . TOOTH EXTRACTION Right 04/21/2013   Procedure: EXTRACTION MOLARS;  Surgeon: Dylan Gonzalez, DDS;  Location: [facility];  Service: Oral Surgery;  Laterality: Right;  . TRANSURETHRAL RESECTION OF PROSTATE  04/13/2018   Procedure: TRANSURETHRAL RESECTION OF THE PROSTATE (TURP);  Surgeon: Dylan Seal, MD;  Location: [facility];  Service: Urology;;    Allergies: Pollen extract  Medications: Prior to Admission medications   Medication Sig Start Date End Date Taking? Authorizing Provider  amLODipine (NORVASC) 10 MG tablet Take 1 tablet (10 mg total) by mouth daily. 04/19/18  Yes Georgette Shell, MD  HYDROcodone-acetaminophen (NORCO/VICODIN) 5-325 MG tablet Take 1 tablet by mouth  every 6 (six) hours as needed for moderate pain. 05/15/18 05/15/19 Yes Dylan Portela, MD  hydrocortisone cream 1 % Apply to affected area 2 times daily 08/05/17  Yes Valere Dross, Alyssa B, PA-C  Hyoscyamine Sulfate SL (LEVSIN/SL) 0.125 MG SUBL Place 0.125 mg under the tongue every 8 (eight) hours as needed. Patient taking differently: Place 0.125 mg under the tongue every 8 (eight) hours as needed (cramping).  04/19/18  Yes Georgette Shell, MD  metoprolol tartrate (LOPRESSOR) 50 MG tablet Take 1 tablet (50 mg total) by mouth 2 (two) times daily. 04/19/18  Yes Georgette Shell, MD  Multiple Vitamin (MULTIVITAMIN) tablet Take 1 tablet by mouth daily.   Yes [provider]  oxyCODONE (OXY IR/ROXICODONE) 5 MG immediate release tablet Take 1-2 tablets (5-10 mg total) by mouth every 4 (four) hours as needed for severe pain. 06/12/18  Yes Dylan Portela, MD  phenazopyridine (PYRIDIUM) 200 MG tablet Take 1 tablet (200 mg total) by mouth 3 (three) times daily as needed (burning). 04/19/18  Yes Georgette Shell, MD  polyethylene glycol [facility] Floria Raveling) packet Take 17 g by mouth 2 (two) times daily as needed for mild constipation or moderate constipation. 04/23/18  Yes Pollina, Dylan Allegra, MD  prochlorperazine (COMPAZINE) 10 MG tablet Take 1 tablet (10 mg total) by mouth every 6 (six) hours as needed for nausea or vomiting. 05/15/18  Yes Dylan Gonzalez, Dylan Dad, MD  belladonna-opium (B&O SUPPRETTES) 16.2-30 MG suppository Place 1 suppository (30 mg total) rectally every 8 (eight) hours as needed for pain. 04/23/18   Dylan Greek, MD  cefUROXime (CEFTIN) 250 MG tablet Take 1 tablet (250 mg total) by mouth 2 (two) times daily with a meal. 04/23/18   Pollina, Dylan Allegra, MD  cetirizine (ZYRTEC) 10 MG tablet Take 10 mg by mouth daily as needed for allergies.    [provider]  fluticasone (FLONASE) 50 MCG/ACT nasal spray PLACE 2 SPRAYS INTO BOTH NOSTRILS DAILY. 07/22/14   Le, Dylan P, DO    hydrOXYzine (VISTARIL) 100 MG capsule TAKE ONE CAPSULE BY MOUTH AT BEDTIME AS NEEDED FOR ALLERGY Patient taking differently: Take 100 mg by mouth at bedtime as needed (allergies).  10/12/15   Robyn Haber, MD     Family History  Problem Relation Age of Onset  . Hypertension Father   . Depression Brother     Social History   Socioeconomic History  . Marital status: Married    Spouse name: Not on file  . Number of children: Not on file  . Years of education: Not on file  . Highest education level: Not on file  Occupational History  . Not on file  Social Needs  . Financial resource strain: Not on file  . Food insecurity:    Worry: Not on file    Inability: Not on file  . Transportation needs:    Medical: Not on file    Non-medical: Not on file  Tobacco Use  . Smoking status: Never Smoker  . Smokeless tobacco: Never Used  Substance and Sexual Activity  . Alcohol use: No  . Drug use: No  . Sexual activity: Not Currently  Lifestyle  . Physical activity:    Days per week: Not on file    Minutes per session: Not on file  . Stress: Not on file  Relationships  . Social connections:    Talks on phone: Not on file    Gets together: Not on file    Attends religious service: Not on file    Active member of club or organization: Not on file    Attends meetings of clubs or organizations: Not on file    Relationship status: Not on file  Other Topics Concern  . Not on file  Social History Narrative  . Not on file     Review of Systems: A 12 point ROS discussed and pertinent positives are indicated in the HPI above.  All other systems are negative.  Review of Systems  Vital Signs: BP (!) 130/96 (BP Location: Left Arm)   Pulse 99   Temp 98.2 F (36.8 C) (Oral)   Resp 18   SpO2 95%   Physical Exam  Constitutional: He is oriented to person, place, and time. He appears well-developed. No distress.  HENT:  Head: Normocephalic.  Mouth/Throat: Oropharynx is clear and  moist.  Neck: Normal range of motion. No JVD present.  Cardiovascular: Normal rate, regular rhythm and normal heart sounds.  Pulmonary/Chest: Effort normal and breath sounds normal. No respiratory distress.  Neurological: He is alert and oriented to person, place, and time.  Skin: Skin is warm and dry.  Psychiatric: He has a normal mood and affect.    Imaging: Ct Chest W Contrast  Result Date: 05/22/2018 CLINICAL DATA:  High-grade leiomyosarcoma from the prostate gland diagnosed 2009. EXAM: CT CHEST, ABDOMEN, AND PELVIS WITH CONTRAST TECHNIQUE: Multidetector CT imaging of the chest, abdomen and pelvis was performed following the standard protocol during bolus administration of intravenous contrast. CONTRAST:  124mL OMNIPAQUE IOHEXOL 300 MG/ML  SOLN COMPARISON:  CT 04/12/2018 FINDINGS: CT CHEST  FINDINGS Cardiovascular: No significant vascular findings. Normal heart size. No pericardial effusion. Mediastinum/Nodes: No axillary supraclavicular adenopathy. No mediastinal adenopathy. Lungs/Pleura: RIGHT upper lobe pulmonary nodule measuring 7 mm compared to 6 mm (image 35/4). LEFT lower lobe pulmonary nodule measures 8 mm (image 101/4) not changed from 8 mm. No new pulmonary nodules. Musculoskeletal: No aggressive osseous lesion. CT ABDOMEN AND PELVIS FINDINGS Hepatobiliary: Low-density lesion in the RIGHT central liver not changed favored a benign cyst. Additional low-density lesion in the inferior RIGHT hepatic lobe (image 68/2) is also unchanged. Pancreas: Pancreas is normal. No ductal dilatation. No pancreatic inflammation. Spleen: Normal spleen Adrenals/urinary tract: Adrenal glands normal. Bilateral ureteral stents in place. No hydronephrosis. Normal renal cortical enhancement. The bladder is displaced anteriorly by the pelvic mass. Foley catheter within lumen of the bladder. Stomach/Bowel: Stomach, small bowel, appendix, and cecum are normal. The colon and rectosigmoid colon are normal.  Vascular/Lymphatic: Abdominal aorta is normal caliber. There is no retroperitoneal or periportal lymphadenopathy. No pelvic lymphadenopathy. Reproductive: Large pelvic mass has increased in volume in the short interval measuring 13.0 x 10.3 by 9.6 cm (volume = 670 cm^3) compared with 11.3 x 9.0 by 8.8 cm (volume = 470 cm^3). Mass elevates the bladder. Heterogeneous peripheral enhancement. Other: No free fluid. Musculoskeletal: No aggressive osseous lesion. IMPRESSION: Chest Impression: 1. Stable bilateral pulmonary metastasis. 2. No new pulmonary metastasis.  No lymphadenopathy Abdomen / Pelvis Impression: 1. Interval enlargement of pelvic mass which compresses the bladder. 2. Bilateral ureteral stents in place without hydronephrosis. 3. Small hypodense lesions in the liver are favored benign cysts Electronically Signed   By: Suzy Bouchard M.D.   On: 05/22/2018 16:13   Ct Abdomen Pelvis W Contrast  Result Date: 05/22/2018 CLINICAL DATA:  High-grade leiomyosarcoma from the prostate gland diagnosed 2009. EXAM: CT CHEST, ABDOMEN, AND PELVIS WITH CONTRAST TECHNIQUE: Multidetector CT imaging of the chest, abdomen and pelvis was performed following the standard protocol during bolus administration of intravenous contrast. CONTRAST:  134mL OMNIPAQUE IOHEXOL 300 MG/ML  SOLN COMPARISON:  CT 04/12/2018 FINDINGS: CT CHEST FINDINGS Cardiovascular: No significant vascular findings. Normal heart size. No pericardial effusion. Mediastinum/Nodes: No axillary supraclavicular adenopathy. No mediastinal adenopathy. Lungs/Pleura: RIGHT upper lobe pulmonary nodule measuring 7 mm compared to 6 mm (image 35/4). LEFT lower lobe pulmonary nodule measures 8 mm (image 101/4) not changed from 8 mm. No new pulmonary nodules. Musculoskeletal: No aggressive osseous lesion. CT ABDOMEN AND PELVIS FINDINGS Hepatobiliary: Low-density lesion in the RIGHT central liver not changed favored a benign cyst. Additional low-density lesion in the  inferior RIGHT hepatic lobe (image 68/2) is also unchanged. Pancreas: Pancreas is normal. No ductal dilatation. No pancreatic inflammation. Spleen: Normal spleen Adrenals/urinary tract: Adrenal glands normal. Bilateral ureteral stents in place. No hydronephrosis. Normal renal cortical enhancement. The bladder is displaced anteriorly by the pelvic mass. Foley catheter within lumen of the bladder. Stomach/Bowel: Stomach, small bowel, appendix, and cecum are normal. The colon and rectosigmoid colon are normal. Vascular/Lymphatic: Abdominal aorta is normal caliber. There is no retroperitoneal or periportal lymphadenopathy. No pelvic lymphadenopathy. Reproductive: Large pelvic mass has increased in volume in the short interval measuring 13.0 x 10.3 by 9.6 cm (volume = 670 cm^3) compared with 11.3 x 9.0 by 8.8 cm (volume = 470 cm^3). Mass elevates the bladder. Heterogeneous peripheral enhancement. Other: No free fluid. Musculoskeletal: No aggressive osseous lesion. IMPRESSION: Chest Impression: 1. Stable bilateral pulmonary metastasis. 2. No new pulmonary metastasis.  No lymphadenopathy Abdomen / Pelvis Impression: 1. Interval enlargement of pelvic mass which  compresses the bladder. 2. Bilateral ureteral stents in place without hydronephrosis. 3. Small hypodense lesions in the liver are favored benign cysts Electronically Signed   By: Suzy Bouchard M.D.   On: 05/22/2018 16:13    Labs:  CBC: Recent Labs    04/23/18 1503 05/30/18 0815 06/07/18 1039 06/12/18 1100  WBC 11.5* 9.2 5.8 7.3  HGB 12.2* 12.0* 12.0* 11.1*  HCT 37.5* 37.7* 37.2* 34.2*  PLT 359 234 180 141    COAGS: No results for input(s): INR, APTT in the last 8760 hours.  BMP: Recent Labs    04/23/18 1503 05/30/18 0815 06/07/18 1039 06/12/18 1100  NA 141 144 144 144  K 3.6 3.4* 3.6 4.0  CL 103 103 105 104  CO2 29 30 27 30   GLUCOSE 139* 116* 159* 116*  BUN 14 14 10 12   CALCIUM 9.5 9.5 9.7 9.6  CREATININE 1.28* 1.29* 1.25* 1.19    GFRNONAA >60 >60 >60 >60  GFRAA >60 >60 >60 >60    LIVER FUNCTION TESTS: Recent Labs    04/23/18 0146 05/30/18 0815 06/07/18 1039 06/12/18 1100  BILITOT 1.1 0.4 0.5 0.6  AST 28 21 30  53*  ALT 21 13 32 90*  ALKPHOS 42 57 64 91  PROT 7.4 7.1 7.6 7.5  ALBUMIN 4.1 3.8 3.7 3.8    TUMOR MARKERS: No results for input(s): AFPTM, CEA, CA199, CHROMGRNA in the last 8760 hours.  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 For port placement Labs pending Risks and benefits of image guided port-a-catheter placement was discussed with the patient including, but not limited to bleeding, infection, pneumothorax, or fibrin sheath development and need for additional procedures.  All of the patient's questions were answered, patient is agreeable to proceed. Consent signed and in chart.    Thank you for this interesting consult.  I greatly enjoyed meeting ULYSESS WITZ and look forward to participating in their care.  A copy of this report was sent to the requesting provider on this date.  Electronically Signed: Ascencion Dike, PA-C 06/21/2018, 12:25 PM   I spent a total of 20 minutes in face to face in clinical consultation, greater than 50% of which was counseling/coordinating care for port

## 2018-06-21 NOTE — Discharge Instructions (Signed)
Implanted Port Home Guide °An implanted port is a type of central line that is placed under the skin. Central lines are used to provide IV access when treatment or nutrition needs to be given through a person’s veins. Implanted ports are used for long-term IV access. An implanted port may be placed because: °· You need IV medicine that would be irritating to the small veins in your hands or arms. °· You need long-term IV medicines, such as antibiotics. °· You need IV nutrition for a long period. °· You need frequent blood draws for lab tests. °· You need dialysis. ° °Implanted ports are usually placed in the chest area, but they can also be placed in the upper arm, the abdomen, or the leg. An implanted port has two main parts: °· Reservoir. The reservoir is round and will appear as a small, raised area under your skin. The reservoir is the part where a needle is inserted to give medicines or draw blood. °· Catheter. The catheter is a thin, flexible tube that extends from the reservoir. The catheter is placed into a large vein. Medicine that is inserted into the reservoir goes into the catheter and then into the vein. ° °How will I care for my incision site? °Do not get the incision site wet. Bathe or shower as directed by your health care provider. °How is my port accessed? °Special steps must be taken to access the port: °· Before the port is accessed, a numbing cream can be placed on the skin. This helps numb the skin over the port site. °· Your health care provider uses a sterile technique to access the port. °? Your health care provider must put on a mask and sterile gloves. °? The skin over your port is cleaned carefully with an antiseptic and allowed to dry. °? The port is gently pinched between sterile gloves, and a needle is inserted into the port. °· Only "non-coring" port needles should be used to access the port. Once the port is accessed, a blood return should be checked. This helps ensure that the port  is in the vein and is not clogged. °· If your port needs to remain accessed for a constant infusion, a clear (transparent) bandage will be placed over the needle site. The bandage and needle will need to be changed every week, or as directed by your health care provider. °· Keep the bandage covering the needle clean and dry. Do not get it wet. Follow your health care provider’s instructions on how to take a shower or bath while the port is accessed. °· If your port does not need to stay accessed, no bandage is needed over the port. ° °What is flushing? °Flushing helps keep the port from getting clogged. Follow your health care provider’s instructions on how and when to flush the port. Ports are usually flushed with saline solution or a medicine called heparin. The need for flushing will depend on how the port is used. °· If the port is used for intermittent medicines or blood draws, the port will need to be flushed: °? After medicines have been given. °? After blood has been drawn. °? As part of routine maintenance. °· If a constant infusion is running, the port may not need to be flushed. ° °How long will my port stay implanted? °The port can stay in for as long as your health care provider thinks it is needed. When it is time for the port to come out, surgery will be   done to remove it. The procedure is similar to the one performed when the port was put in. When should I seek immediate medical care? When you have an implanted port, you should seek immediate medical care if:  You notice a bad smell coming from the incision site.  You have swelling, redness, or drainage at the incision site.  You have more swelling or pain at the port site or the surrounding area.  You have a fever that is not controlled with medicine.  This information is not intended to replace advice given to you by your health care provider. Make sure you discuss any questions you have with your health care provider. Document  Released: 08/23/2005 Document Revised: 01/29/2016 Document Reviewed: 04/30/2013 Elsevier Interactive Patient Education  2017 [place]. Moderate Conscious Sedation, Adult, Care After These instructions provide you with information about caring for yourself after your procedure. Your health care provider may also give you more specific instructions. Your treatment has been planned according to current medical practices, but problems sometimes occur. Call your health care provider if you have any problems or questions after your procedure. What can I expect after the procedure? After your procedure, it is common:  To feel sleepy for several hours.  To feel clumsy and have poor balance for several hours.  To have poor judgment for several hours.  To vomit if you eat too soon.  Follow these instructions at home: For at least 24 hours after the procedure:   Do not: ? Participate in activities where you could fall or become injured. ? Drive. ? Use heavy machinery. ? Drink alcohol. ? Take sleeping pills or medicines that cause drowsiness. ? Make important decisions or sign legal documents. ? Take care of children on your own.  Rest. Eating and drinking  Follow the diet recommended by your health care provider.  If you vomit: ? Drink water, juice, or soup when you can drink without vomiting. ? Make sure you have little or no nausea before eating solid foods. General instructions  Have a responsible adult stay with you until you are awake and alert.  Take over-the-counter and prescription medicines only as told by your health care provider.  If you smoke, do not smoke without supervision.  Keep all follow-up visits as told by your health care provider. This is important. Contact a health care provider if:  You keep feeling nauseous or you keep vomiting.  You feel light-headed.  You develop a rash.  You have a fever. Get help right away if:  You have trouble  breathing. This information is not intended to replace advice given to you by your health care provider. Make sure you discuss any questions you have with your health care provider. Document Released: 06/13/2013 Document Revised: 01/26/2016 Document Reviewed: 12/13/2015 Elsevier Interactive Patient Education  [employer].

## 2018-06-22 ENCOUNTER — Other Ambulatory Visit: Payer: Self-pay

## 2018-06-22 ENCOUNTER — Encounter: Payer: Self-pay | Admitting: Oncology

## 2018-06-22 ENCOUNTER — Inpatient Hospital Stay: Payer: BLUE CROSS/BLUE SHIELD

## 2018-06-22 ENCOUNTER — Ambulatory Visit: Payer: BLUE CROSS/BLUE SHIELD

## 2018-06-22 ENCOUNTER — Other Ambulatory Visit: Payer: Self-pay | Admitting: Oncology

## 2018-06-22 VITALS — BP 122/80 | HR 90 | Temp 98.6°F | Resp 17

## 2018-06-22 DIAGNOSIS — C7800 Secondary malignant neoplasm of unspecified lung: Secondary | ICD-10-CM

## 2018-06-22 DIAGNOSIS — Z95828 Presence of other vascular implants and grafts: Secondary | ICD-10-CM

## 2018-06-22 DIAGNOSIS — C494 Malignant neoplasm of connective and soft tissue of abdomen: Secondary | ICD-10-CM

## 2018-06-22 LAB — CMP (CANCER CENTER ONLY)
ALBUMIN: 3.4 g/dL — AB (ref 3.5–5.0)
ALK PHOS: 84 U/L (ref 38–126)
ALT: 93 U/L — AB (ref 0–44)
AST: 57 U/L — ABNORMAL HIGH (ref 15–41)
Anion gap: 10 (ref 5–15)
BILIRUBIN TOTAL: 0.4 mg/dL (ref 0.3–1.2)
BUN: 11 mg/dL (ref 6–20)
CALCIUM: 9 mg/dL (ref 8.9–10.3)
CO2: 29 mmol/L (ref 22–32)
CREATININE: 1 mg/dL (ref 0.61–1.24)
Chloride: 105 mmol/L (ref 98–111)
GFR, Est AFR Am: >60 mL/min (ref >60)
GFR, Estimated: >60 mL/min (ref >60)
GLUCOSE: 130 mg/dL — AB (ref 70–99)
Potassium: 3.7 mmol/L (ref 3.5–5.1)
SODIUM: 144 mmol/L (ref 135–145)
TOTAL PROTEIN: 6.7 g/dL (ref 6.5–8.1)

## 2018-06-22 LAB — CBC WITH DIFFERENTIAL (CANCER CENTER ONLY)
Abs Immature Granulocytes: 0.21 10*3/uL — ABNORMAL HIGH (ref 0.00–0.07)
BASOS ABS: 0.1 10*3/uL (ref 0.0–0.1)
Basophils Relative: 1 %
EOS ABS: 0 10*3/uL (ref 0.0–0.5)
EOS PCT: 0 %
HCT: 31.4 % — ABNORMAL LOW (ref 39.0–52.0)
HEMOGLOBIN: 9.9 g/dL — AB (ref 13.0–17.0)
Immature Granulocytes: 2 %
Lymphocytes Relative: 17 %
Lymphs Abs: 2 10*3/uL (ref 0.7–4.0)
MCH: 31.5 pg (ref 26.0–34.0)
MCHC: 31.5 g/dL (ref 30.0–36.0)
MCV: 100 fL (ref 80.0–100.0)
MONO ABS: 1.2 10*3/uL — AB (ref 0.1–1.0)
Monocytes Relative: 10 %
NRBC: 1.4 % — AB (ref 0.0–0.2)
Neutro Abs: 8.4 10*3/uL — ABNORMAL HIGH (ref 1.7–7.7)
Neutrophils Relative %: 70 %
Platelet Count: 336 10*3/uL (ref 150–400)
RBC: 3.14 MIL/uL — AB (ref 4.22–5.81)
RDW: 14.8 % (ref 11.5–15.5)
WBC: 12 10*3/uL — AB (ref 4.0–10.5)

## 2018-06-22 MED ORDER — SODIUM CHLORIDE 0.9 % IV SOLN
2000.0000 mg | Freq: Once | INTRAVENOUS | Status: AC
  Administered 2018-06-22: 2000 mg via INTRAVENOUS
  Filled 2018-06-22: qty 52.6

## 2018-06-22 MED ORDER — SODIUM CHLORIDE 0.9% FLUSH
10.0000 mL | INTRAVENOUS | Status: DC | PRN
  Administered 2018-06-22: 10 mL via INTRAVENOUS
  Filled 2018-06-22: qty 10

## 2018-06-22 MED ORDER — SODIUM CHLORIDE 0.9% FLUSH
10.0000 mL | INTRAVENOUS | Status: DC | PRN
  Administered 2018-06-22: 10 mL
  Filled 2018-06-22: qty 10

## 2018-06-22 MED ORDER — PROCHLORPERAZINE MALEATE 10 MG PO TABS
10.0000 mg | ORAL_TABLET | Freq: Once | ORAL | Status: AC
  Administered 2018-06-22: 10 mg via ORAL

## 2018-06-22 MED ORDER — PROCHLORPERAZINE MALEATE 10 MG PO TABS
ORAL_TABLET | ORAL | Status: AC
  Filled 2018-06-22: qty 1

## 2018-06-22 MED ORDER — HEPARIN SOD (PORK) LOCK FLUSH 100 UNIT/ML IV SOLN
500.0000 [IU] | Freq: Once | INTRAVENOUS | Status: AC | PRN
  Administered 2018-06-22: 500 [IU]
  Filled 2018-06-22: qty 5

## 2018-06-22 MED ORDER — SODIUM CHLORIDE 0.9 % IV SOLN
Freq: Once | INTRAVENOUS | Status: AC
  Administered 2018-06-22: 13:00:00 via INTRAVENOUS
  Filled 2018-06-22: qty 250

## 2018-06-22 MED ORDER — LIDOCAINE-PRILOCAINE 2.5-2.5 % EX CREA: 1.0000 "application " | TOPICAL_CREAM | CUTANEOUS | 0 refills | Status: DC | PRN

## 2018-06-22 NOTE — Progress Notes (Signed)
Per Dr. Dahlia Byes, it is ok to treat with ALT of 93.

## 2018-06-22 NOTE — Progress Notes (Signed)
Met with patient in my office to introduce myself as Arboriculturist.   Discussed one-time $500 [place] to assist with personal expenses while going through treatment such as transportation in forms of gas cards, medication copays; etc.Asked patient about income and he states he is receiving short term disability. Asked if he is able to bring proof of income such as award letter or bank statement on Thurs 06/29/18. He states he should be able to.   Gave my card for any additional financial questions or concerns.

## 2018-06-22 NOTE — Patient Instructions (Signed)
Cancer Center °Discharge Instructions for Patients Receiving Chemotherapy ° °Today you received the following chemotherapy agents Gemzar ° °To help prevent nausea and vomiting after your treatment, we encourage you to take your nausea medication as directed. °  °If you develop nausea and vomiting that is not controlled by your nausea medication, call the clinic.  ° °BELOW ARE SYMPTOMS THAT SHOULD BE REPORTED IMMEDIATELY: °· *FEVER GREATER THAN 100.5 F °· *CHILLS WITH OR WITHOUT FEVER °· NAUSEA AND VOMITING THAT IS NOT CONTROLLED WITH YOUR NAUSEA MEDICATION °· *UNUSUAL SHORTNESS OF BREATH °· *UNUSUAL BRUISING OR BLEEDING °· TENDERNESS IN MOUTH AND THROAT WITH OR WITHOUT PRESENCE OF ULCERS °· *URINARY PROBLEMS °· *BOWEL PROBLEMS °· UNUSUAL RASH °Items with * indicate a potential emergency and should be followed up as soon as possible. ° °Feel free to call the clinic should you have any questions or concerns. The clinic phone number is [phone]. ° °Please show the CHEMO ALERT CARD at check-in to the Emergency Department and triage nurse. ° ° °

## 2018-06-23 ENCOUNTER — Emergency Department (HOSPITAL_COMMUNITY)
Admission: EM | Admit: 2018-06-23 | Discharge: 2018-06-23 | Disposition: A | Discharge status: Home / Self Care | Payer: BLUE CROSS/BLUE SHIELD | Attending: Emergency Medicine | Admitting: Emergency Medicine

## 2018-06-23 ENCOUNTER — Ambulatory Visit (HOSPITAL_BASED_OUTPATIENT_CLINIC_OR_DEPARTMENT_OTHER)
Admission: RE | Admit: 2018-06-23 | Discharge: 2018-06-23 | Disposition: A | Discharge status: Home / Self Care | Payer: BLUE CROSS/BLUE SHIELD | Source: Ambulatory Visit | Attending: Oncology | Admitting: Oncology

## 2018-06-23 ENCOUNTER — Other Ambulatory Visit: Payer: Self-pay

## 2018-06-23 ENCOUNTER — Encounter (HOSPITAL_COMMUNITY): Payer: Self-pay

## 2018-06-23 ENCOUNTER — Other Ambulatory Visit: Payer: Self-pay | Admitting: Oncology

## 2018-06-23 ENCOUNTER — Inpatient Hospital Stay (HOSPITAL_BASED_OUTPATIENT_CLINIC_OR_DEPARTMENT_OTHER): Payer: BLUE CROSS/BLUE SHIELD | Admitting: Oncology

## 2018-06-23 DIAGNOSIS — I82402 Acute embolism and thrombosis of unspecified deep veins of left lower extremity: Secondary | ICD-10-CM

## 2018-06-23 DIAGNOSIS — C61 Malignant neoplasm of prostate: Secondary | ICD-10-CM

## 2018-06-23 DIAGNOSIS — Z79899 Other long term (current) drug therapy: Secondary | ICD-10-CM | POA: Insufficient documentation

## 2018-06-23 DIAGNOSIS — C7800 Secondary malignant neoplasm of unspecified lung: Secondary | ICD-10-CM

## 2018-06-23 DIAGNOSIS — I10 Essential (primary) hypertension: Secondary | ICD-10-CM | POA: Insufficient documentation

## 2018-06-23 DIAGNOSIS — I252 Old myocardial infarction: Secondary | ICD-10-CM | POA: Insufficient documentation

## 2018-06-23 DIAGNOSIS — Z7901 Long term (current) use of anticoagulants: Secondary | ICD-10-CM | POA: Diagnosis not present

## 2018-06-23 DIAGNOSIS — R58 Hemorrhage, not elsewhere classified: Secondary | ICD-10-CM | POA: Diagnosis not present

## 2018-06-23 DIAGNOSIS — I82412 Acute embolism and thrombosis of left femoral vein: Secondary | ICD-10-CM

## 2018-06-23 DIAGNOSIS — R310 Gross hematuria: Secondary | ICD-10-CM

## 2018-06-23 DIAGNOSIS — R319 Hematuria, unspecified: Secondary | ICD-10-CM | POA: Diagnosis not present

## 2018-06-23 DIAGNOSIS — I824Z2 Acute embolism and thrombosis of unspecified deep veins of left distal lower extremity: Secondary | ICD-10-CM

## 2018-06-23 LAB — CBC WITH DIFFERENTIAL/PLATELET
Abs Immature Granulocytes: 0.03 10*3/uL (ref 0.00–0.07)
BASOS PCT: 0 %
Basophils Absolute: 0 10*3/uL (ref 0.0–0.1)
EOS ABS: 0 10*3/uL (ref 0.0–0.5)
Eosinophils Relative: 1 %
HEMATOCRIT: 30.4 % — AB (ref 39.0–52.0)
Hemoglobin: 9.6 g/dL — ABNORMAL LOW (ref 13.0–17.0)
IMMATURE GRANULOCYTES: 0 %
LYMPHS ABS: 1.3 10*3/uL (ref 0.7–4.0)
Lymphocytes Relative: 15 %
MCH: 32.1 pg (ref 26.0–34.0)
MCHC: 31.6 g/dL (ref 30.0–36.0)
MCV: 101.7 fL — ABNORMAL HIGH (ref 80.0–100.0)
Monocytes Absolute: 1 10*3/uL (ref 0.1–1.0)
Monocytes Relative: 12 %
NEUTROS PCT: 72 %
NRBC: 0 % (ref 0.0–0.2)
Neutro Abs: 6.1 10*3/uL (ref 1.7–7.7)
PLATELETS: 411 10*3/uL — AB (ref 150–400)
RBC: 2.99 MIL/uL — ABNORMAL LOW (ref 4.22–5.81)
RDW: 15 % (ref 11.5–15.5)
WBC: 8.5 10*3/uL (ref 4.0–10.5)

## 2018-06-23 LAB — URINALYSIS, ROUTINE W REFLEX MICROSCOPIC
Bilirubin Urine: NEGATIVE
Glucose, UA: NEGATIVE mg/dL
KETONES UR: NEGATIVE mg/dL
NITRITE: NEGATIVE
PH: 7 (ref 5.0–8.0)
Protein, ur: >=300 mg/dL — AB
RBC / HPF: >50 RBC/hpf — ABNORMAL HIGH (ref 0–5)
Specific Gravity, Urine: 1.011 (ref 1.005–1.030)

## 2018-06-23 MED ORDER — RIVAROXABAN (XARELTO) VTE STARTER PACK (15 & 20 MG): ORAL_TABLET | ORAL | 0 refills | Status: DC

## 2018-06-23 MED ORDER — CIPROFLOXACIN HCL 500 MG PO TABS
500.0000 mg | ORAL_TABLET | Freq: Once | ORAL | Status: AC
  Administered 2018-06-23: 500 mg via ORAL
  Filled 2018-06-23: qty 1

## 2018-06-23 MED ORDER — CIPROFLOXACIN HCL 500 MG PO TABS: 500.0000 mg | ORAL_TABLET | Freq: Two times a day (BID) | ORAL | 0 refills | Status: DC

## 2018-06-23 MED ORDER — OXYCODONE-ACETAMINOPHEN 5-325 MG PO TABS
2.0000 | ORAL_TABLET | Freq: Once | ORAL | Status: AC
  Administered 2018-06-23: 2 via ORAL
  Filled 2018-06-23: qty 2

## 2018-06-23 MED FILL — XARELTO STARTER PACK: 15 & 20 | 30 days supply | Qty: 51 | Fill #0

## 2018-06-23 NOTE — Discharge Instructions (Addendum)
Your urine shows that you have a UTI. I have prescribed you an antibiotic (Ciprofloxacin) to treat this. Please take the full seven (7) days worth to ensure the infection is fully treated.  Please call your urologist or oncologist ASAP to discuss today's emergency room visit and if you still continue to have bloody urine. Continue taking the Xarelto as prescribed for your blood clot.  Follow-up with a medical provider sooner if you experience the following: blood loss from other body parts (mouth, nose, rectum, in stools, etc); dizziness/lightheadedness; catheter stops working.  Thank you for allowing [place] to take care of you today.

## 2018-06-23 NOTE — ED Triage Notes (Signed)
Patient c/o blood in the urine. He currently has a urinary catheter in place and has noticed blood in his urine for 1 hour. Due to a blood clot in the groin, he started xarelto today. He also complains of pain in his abdomin around the navel region he rates as a 5/10.  150/96 BP 18 Resp rate 90 HR

## 2018-06-23 NOTE — ED Provider Notes (Signed)
[facility] DEPT Provider Note  CSN: [id number] Arrival date & time: 06/23/18  1540  History   Chief Complaint Chief Complaint  Patient presents with  . Hematuria   HPI Dylan Gonzalez is a 52 y.o. male with a medical history of HTN, MI and prostate cancer with lung mets who presented to the ED for hematuria x1 day. Patient states that he noticed hematuria this afternoon in his catheter bag. Associated symptoms: suprapubic discomfort, penile discharge and scrotal pain which he states have been present for the last week. Denies fever, chills, change in bowel habits, N/V or issues with function of the catheter. He has not had any intervention or spoken to his urologist, Dr. Roni Bread, about this.  Additional history obtained by medical chart. Patient seen by his oncologist earlier today following DVT study which showed DVT in left groin and was started on Xarelto. At his oncologist visit, there was no hematuria.  Past Medical History:  Diagnosis Date  . Hypertension   . MI (myocardial infarction) [facility])    2007    Patient Active Problem List   Diagnosis Date Noted  . Goals of care, counseling/discussion 05/15/2018  . Bilateral ureteral obstruction 04/19/2018  . 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 Prostate neoplasm 04/13/2018  . Hypertension 12/26/2011  . 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 CYSTOSCOPY W/ URETERAL STENT PLACEMENT Bilateral 04/13/2018   Procedure: CYSTOSCOPY WITH BILATERAL STENT REPLACEMENT;  Surgeon: Irine Seal, MD;  Location: [facility];  Service: Urology;  Laterality: Bilateral;  . eye removed    . HERNIA REPAIR    . INTRAOCULAR PROSTHESES INSERTION    . IR IMAGING GUIDED PORT INSERTION  06/21/2018  . PROSTATE BIOPSY [name] 04/13/2018   Procedure: ULTRASOUND GUIDED PROSTATE BIOPSY;  Surgeon:  Irine Seal, MD;  Location: [facility];  Service: Urology;  Laterality: [name];  . TOOTH EXTRACTION Right 04/21/2013   Procedure: EXTRACTION MOLARS;  Surgeon: [name], DDS;  Location: [facility];  Service: Oral Surgery;  Laterality: Right;  . TRANSURETHRAL RESECTION OF PROSTATE  04/13/2018   Procedure: TRANSURETHRAL RESECTION OF THE PROSTATE (TURP);  Surgeon: Irine Seal, MD;  Location: [facility];  Service: Urology;;        Home Medications    Prior to Admission medications   Medication Sig Start Date End Date Taking? Authorizing Provider  ALPRAZolam Duanne Moron) 0.5 MG tablet Take 0.5 mg by mouth 2 (two) times daily as needed for anxiety.  05/31/18  Yes [provider]  amLODipine (NORVASC) 10 MG tablet Take 1 tablet (10 mg total) by mouth daily. 04/19/18  Yes Georgette Shell, MD  hydrochlorothiazide (HYDRODIURIL) 25 MG tablet Take 25 mg by mouth daily.  05/02/18  Yes [provider]  metoprolol succinate (TOPROL-XL) 50 MG 24 hr tablet Take 50 mg by mouth daily. 05/17/18  Yes [provider]  Multiple Vitamin (MULTIVITAMIN) tablet Take 1 tablet by mouth daily.   Yes [provider]  oxybutynin (DITROPAN) 5 MG tablet Take 5 mg by mouth 3 (three) times daily. 06/04/18  Yes [provider]  oxyCODONE (OXY IR/ROXICODONE) 5 MG immediate release tablet Take 1-2 tablets (5-10 mg total) by mouth every 4 (four) hours as needed for severe pain. 06/12/18  Yes [name], MD  polyethylene glycol (MIRALAX / GLYCOLAX) packet Take 17 g by mouth 2 (two) times daily as  needed for mild constipation or moderate constipation. 04/23/18  Yes Pollina, Gwenyth Allegra, MD  Rivaroxaban 15 & 20 MG TBPK Take as directed on package: Start with one 15mg  tablet by mouth twice a day with food. On Day 22, switch to one 20mg  tablet once a day with food. 06/23/18  Yes [name], MD  belladonna-opium (B&O SUPPRETTES) 16.2-30 MG suppository Place 1 suppository (30 mg total) rectally every 8  (eight) hours as needed for pain. 04/23/18   Orpah Greek, MD  cefUROXime (CEFTIN) 250 MG tablet Take 1 tablet (250 mg total) by mouth 2 (two) times daily with a meal. Patient not taking: Reported on 06/23/2018 04/23/18   Orpah Greek, MD  cetirizine (ZYRTEC) 10 MG tablet Take 10 mg by mouth daily as needed for allergies.    [provider]  ciprofloxacin (CIPRO) 500 MG tablet Take 1 tablet (500 mg total) by mouth 2 (two) times daily for 7 days. 06/23/18 06/30/18  Valerye Kobus, Alvie Heidelberg I, PA-C  [facility] PURELAX powder Take 0.5 Containers by mouth daily as needed for mild constipation.  04/23/18   [provider]  fluticasone (FLONASE) 50 MCG/ACT nasal spray PLACE 2 SPRAYS INTO BOTH NOSTRILS DAILY. Patient taking differently: Place 1 spray into both nostrils daily as needed for allergies.  07/22/14   Le, Thao P, DO  HYDROcodone-acetaminophen (NORCO/VICODIN) 5-325 MG tablet Take 1 tablet by mouth every 6 (six) hours as needed for moderate pain. Patient not taking: Reported on 06/23/2018 05/15/18 05/15/19  [name], MD  hydrocortisone cream 1 % Apply to affected area 2 times daily Patient taking differently: Apply 1 application topically 2 (two) times daily as needed for itching (dr skin).  08/05/17   Langston Masker B, PA-C  hydrOXYzine (VISTARIL) 100 MG capsule TAKE ONE CAPSULE BY MOUTH AT BEDTIME AS NEEDED FOR ALLERGY Patient taking differently: Take 100 mg by mouth at bedtime as needed (allergies).  10/12/15   Robyn Haber, MD  Hyoscyamine Sulfate SL (LEVSIN/SL) 0.125 MG SUBL Place 0.125 mg under the tongue every 8 (eight) hours as needed. Patient taking differently: Place 0.125 mg under the tongue every 8 (eight) hours as needed (cramping).  04/19/18   Georgette Shell, MD  lidocaine-prilocaine (EMLA) cream Apply 1 application topically as needed. 06/22/18   [name], MD  metoprolol tartrate (LOPRESSOR) 50 MG tablet Take 1 tablet (50 mg total) by mouth 2  (two) times daily. Patient not taking: Reported on 06/23/2018 04/19/18   Georgette Shell, MD  phenazopyridine (PYRIDIUM) 200 MG tablet Take 1 tablet (200 mg total) by mouth 3 (three) times daily as needed (burning). 04/19/18   Georgette Shell, MD  prochlorperazine (COMPAZINE) 10 MG tablet Take 1 tablet (10 mg total) by mouth every 6 (six) hours as needed for nausea or vomiting. 05/15/18   [name], MD    Family History Family History  Problem Relation Age of Onset  . Hypertension Father   . Depression Brother     Social History Social History   Tobacco Use  . Smoking status: Never Smoker  . Smokeless tobacco: Never Used  Substance Use Topics  . Alcohol use: No  . Drug use: No     Allergies   Pollen extract   Review of Systems Review of Systems  Constitutional: Negative.   Gastrointestinal: Negative for abdominal pain, constipation, diarrhea, nausea and vomiting.  Genitourinary: Positive for discharge, hematuria and testicular pain.  Skin: Negative.   Hematological: Bruises/bleeds easily (Began  Xarelto today).  All other systems reviewed and are negative.  Physical Exam Updated Vital Signs BP (!) 143/96   Pulse 96   Temp 98.4 F (36.9 C) (Oral)   Resp 16   Ht 5\' 11"  (1.803 m)   Wt 94.4 kg   SpO2 100%   BMI 29.03 kg/m   Physical Exam  Constitutional: He appears well-developed and well-nourished. He is cooperative.  Cardiovascular: Normal rate, regular rhythm and normal heart sounds.  Pulmonary/Chest: Effort normal and breath sounds normal.  Abdominal: Soft. Bowel sounds are normal. He exhibits no distension. There is no tenderness.  Genitourinary: Prostate is enlarged. Prostate is not tender. Uncircumcised. No penile erythema or penile tenderness. Discharge found.  Genitourinary Comments: GU exam performed with RN chaperone. Foley catheter in place. Dark brown urine seen in leg bag. Yellow discharge seen coming from penis. Scrotum tender to  palpation. Prostate enlarged and hard to palpation.  Lymphadenopathy: No inguinal adenopathy noted on the right or left side.  Neurological: He is alert.  Nursing note and vitals reviewed.  ED Treatments / Results  Labs (all labs ordered are listed, but only abnormal results are displayed) Labs Reviewed  URINALYSIS, ROUTINE W REFLEX MICROSCOPIC - Abnormal; Notable for the following components:      Result Value   Color, Urine AMBER (*)    APPearance CLOUDY (*)    Hgb urine dipstick LARGE (*)    Protein, ur >=300 (*)    Leukocytes, UA SMALL (*)    RBC / HPF >50 (*)    Bacteria, UA RARE (*)    All other components within normal limits  CBC WITH DIFFERENTIAL/PLATELET - Abnormal; Notable for the following components:   RBC 2.99 (*)    Hemoglobin 9.6 (*)    HCT 30.4 (*)    MCV 101.7 (*)    Platelets 411 (*)    All other components within normal limits  URINE CULTURE    EKG None  Radiology No results found.  Procedures Procedures (including critical care time)  Medications Ordered in ED Medications  ciprofloxacin (CIPRO) tablet 500 mg (has no administration in time range)  oxyCODONE-acetaminophen (PERCOCET/ROXICET) 5-325 MG per tablet 2 tablet (2 tablets Oral Given 06/23/18 1846)   Initial Impression / Assessment and Plan / ED Course  Triage vital signs and the nursing notes have been reviewed.  Pertinent labs & imaging results that were available during care of the patient were reviewed and considered in medical decision making (see chart for details).  Patient who is s/p TURP presents to the ED with hematuria. This began approx. 2 hours after patient was started on an anticoagulant for a DVT. He also endorses penile discharge and scrotal pain which has been present for at least the last week. It is likely that hematuria is coming from a UTI and possible traumatic injury to urethra.    Clinical Course as of Jun 23 2053  Fri Jun 23, 2018  1938 Hgb slightly decreased at  9.6. Baseline Hgb is ~10. He has no s/s of anemia currently.   [GM]  2036 Urine culture sent and pending. Given penile discharge, scrotal pain and hematuria along with + leukocytes in urine, will treat as UTI. Patient stable to be followed as outpatient with urology follow-up.   [GM]    Clinical Course User Index [GM] Sible Straley, Jonelle Sports, PA-C    Final Clinical Impressions(s) / ED Diagnoses  1. Hematuria. Most likely due to a combination of factors including UTI, recent anticoagulant  use and recent prostate biopsy. Rx for Cipro given for UTI. Education provided on s/s that warrant sooner medical follow-up. Advised to follow-up with urologist and/or oncologist soon.  Dispo: Home. After thorough clinical evaluation, this patient is determined to be medically stable and can be safely discharged with the previously mentioned treatment and/or outpatient follow-up/referral(s). At this time, there are no other apparent medical conditions that require further screening, evaluation or treatment.  Final diagnoses:  Gross hematuria    ED Discharge Orders         Ordered    ciprofloxacin (CIPRO) 500 MG tablet  2 times daily     06/23/18 2047            Junita Push 06/23/18 2054    Tegeler, Gwenyth Allegra, MD 06/24/18 [phone]

## 2018-06-23 NOTE — Progress Notes (Signed)
*  Preliminary Results* Left lower extremity venous duplex completed. Left lower extremity is positive for acute deep vein thrombosis involving the left common femoral vein and left saphenofemoral junction. There is no evidence of left Baker's cyst.  IVC/Iliac vein duplex completed. There is no obvious evidence of thrombosis involving the IVC, bilateral common iliac veins, or right external iliac vein. There is evidence of acute thrombosis involving the left external iliac vein.  Preliminary results discussed with Seth Bake and Dr. Alen Blew.  06/23/2018 11:06 AM  Maudry Mayhew, MHA, RVT, RDCS, RDMS

## 2018-06-23 NOTE — ED Notes (Signed)
Bed: YM41 Expected date:  Expected time:  Means of arrival:  Comments: EMS-bleeding around cath

## 2018-06-23 NOTE — Progress Notes (Signed)
Hematology and Oncology Follow Up Visit  Dylan Gonzalez [id number] [birth year] 52 y.o. 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  He presented with large pelvic mass diagnosed in August 2019.   Prior Therapy: He is S/P transrectal prostate ultrasound and a prostate biopsy as well as a cystoscopy with insertion of double-J stent and transurethral resection of the prostate completed by Dr. Jeffie Pollock on April 13, 2018.  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  Immunohistochemical stains showed positive for desmin, smooth muscle actin, muscle specific actin and negative for PSA.  He was also negative for cytokeratin 7, 903, CD117 and CD34.     Current therapy: Taxotere with gemcitabine started on 05/30/2018.  He is currently undergoing cycle 2 of therapy.  Interim History: [name] presents today for repeat evaluation after complaining of left ower extremity swelling.  He had an urgent ultrasound Doppler evaluation which showed acute deep vein thrombosis involving the left common femoral vein and the left saphenous vein.  He reports overnight the swelling has decreased after elevating it after noticing bilateral swelling as well.  He denies any chest pain or shortness of breath.  He had a Port-A-Cath placement without any complications.  He has tolerated chemotherapy reasonably well.  He does not report any headaches, blurry vision, syncope or seizures.  Does not report any fevers, chills or sweats.  Does not report any cough, wheezing or hemoptysis.  Does not report any chest pain, palpitation, orthopnea or leg edema.  Does not report any nausea, vomiting or abdominal pain.  Denies any bone pain or fractures.   Does not report frequency, urgency or hematuria.  Does not report any skin  rashes or lesions.  No history of bleeding or thrombosis in the past.  Does not report anxiety or depression.  Remaining review of systems is negative.    Medications: I have reviewed the patient's current medications.  Current Outpatient Medications  Medication Sig Dispense Refill  . amLODipine (NORVASC) 10 MG tablet Take 1 tablet (10 mg total) by mouth daily. 30 tablet 1  . belladonna-opium (B&O SUPPRETTES) 16.2-30 MG suppository Place 1 suppository (30 mg total) rectally every 8 (eight) hours as needed for pain. 20 suppository 0  . cefUROXime (CEFTIN) 250 MG tablet Take 1 tablet (250 mg total) by mouth 2 (two) times daily with a meal. 20 tablet 0  . cetirizine (ZYRTEC) 10 MG tablet Take 10 mg by mouth daily as needed for allergies.    . fluticasone (FLONASE) 50 MCG/ACT nasal spray PLACE 2 SPRAYS INTO BOTH NOSTRILS DAILY. 16 g 9  . HYDROcodone-acetaminophen (NORCO/VICODIN) 5-325 MG tablet Take 1 tablet by mouth every 6 (six) hours as needed for moderate pain. 60 tablet 0  . hydrocortisone cream 1 % Apply to affected area 2 times daily 15 g 0  . hydrOXYzine (VISTARIL) 100 MG capsule TAKE ONE CAPSULE BY MOUTH AT BEDTIME AS NEEDED FOR ALLERGY (Patient taking differently: Take 100 mg by mouth at bedtime as needed (allergies). ) 30 capsule 5  . Hyoscyamine Sulfate SL (LEVSIN/SL) 0.125 MG SUBL Place 0.125 mg under the tongue every 8 (eight) hours as needed. (Patient taking differently: Place 0.125 mg under the tongue every 8 (eight) hours as needed (cramping). ) 120 each 0  . lidocaine-prilocaine (EMLA) cream Apply 1 application topically as needed.  30 g 0  . metoprolol tartrate (LOPRESSOR) 50 MG tablet Take 1 tablet (50 mg total) by mouth 2 (two) times daily. 60 tablet 1  . Multiple Vitamin (MULTIVITAMIN) tablet Take 1 tablet by mouth daily.    Marland Kitchen oxyCODONE (OXY IR/ROXICODONE) 5 MG immediate release tablet Take 1-2 tablets (5-10 mg total) by mouth every 4 (four) hours as needed for severe pain. 60  tablet 0  . phenazopyridine (PYRIDIUM) 200 MG tablet Take 1 tablet (200 mg total) by mouth 3 (three) times daily as needed (burning). 30 tablet 0  . polyethylene glycol (MIRALAX / GLYCOLAX) packet Take 17 g by mouth 2 (two) times daily as needed for mild constipation or moderate constipation. 14 each 0  . prochlorperazine (COMPAZINE) 10 MG tablet Take 1 tablet (10 mg total) by mouth every 6 (six) hours as needed for nausea or vomiting. 30 tablet 0  . Rivaroxaban 15 & 20 MG TBPK Take as directed on package: Start with one 52m tablet by mouth twice a day with food. On Day 22, switch to one 261mtablet once a day with food. 51 each 0   No current facility-administered medications for this visit.      Allergies:  Allergies  Allergen Reactions  . Pollen Extract Other (See Comments)    Sneezing and shortness of breath    Past Medical History, Surgical history, Social history, and Family History were reviewed and updated.    Physical Exam: There were no vitals taken for this visit.    ECOG: 1    General appearance: Alert, awake without any distress. Head: Atraumatic without abnormalities Oropharynx: Without any thrush or ulcers. Eyes: No scleral icterus. Lymph nodes: No lymphadenopathy noted in the cervical, supraclavicular, or axillary nodes Heart:regular rate and rhythm, without any murmurs or gallops.    Left lower extremity swelling noted more than the right. Lung: Clear to auscultation without any rhonchi, wheezes or dullness to percussion. Abdomin: Soft, nontender without any shifting dullness or ascites. Musculoskeletal: No clubbing or cyanosis. Neurological: No motor or sensory deficits. Skin: No rashes or lesions.     Lab Results: Lab Results  Component Value Date   WBC 12.0 (H) 06/22/2018   HGB 9.9 (L) 06/22/2018   HCT 31.4 (L) 06/22/2018   MCV 100.0 06/22/2018   PLT 336 06/22/2018     Chemistry      Component Value Date/Time   NA 144 06/22/2018 1126   K  3.7 06/22/2018 1126   CL 105 06/22/2018 1126   CO2 29 06/22/2018 1126   BUN 11 06/22/2018 1126   CREATININE 1.00 06/22/2018 1126   CREATININE 1.07 06/01/2014 1257      Component Value Date/Time   CALCIUM 9.0 06/22/2018 1126   ALKPHOS 84 06/22/2018 1126   AST 57 (H) 06/22/2018 1126   ALT 93 (H) 06/22/2018 1126   BILITOT 0.4 06/22/2018 1126         Impression and Plan:  [age]ear old man with:  1.  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  He has tolerated chemotherapy reasonably well and received day 1 of cycle 2 without any issues.  The plan is to proceed with the aid of therapy as scheduled on 06/29/2018.  2.  IV access: Port-A-Cath placed without any complications.  The cytopenias clean dry without any drainage.  We have discussed management strategy for his Port-A-Cath in the future.  3.  Acute deep vein thrombosis involving the left common femoral vein:  I will start him on Xarelto currently at15 mg twice a day for 3 weeks and subsequently 20 mg daily.  The duration of anticoagulation possibly 6 months or longer.  Complication associated with this medication include bleeding among others were reviewed.  He is agreeable to proceed.  I given clear instructions to report to emergency department if he develops acute shortness of breath.  4.  Follow-up: October 24 for day 8 of cycle 2 of therapy.  15  minutes was spent with the patient face-to-face today.  More than 50% of time was dedicated to discuss the treatment and management of acute deep vein thrombosis, coordinating plan of care and Port-A-Cath management recommendations.     [name], MD 10/18/201911:46 AM

## 2018-06-24 ENCOUNTER — Ambulatory Visit: Payer: BLUE CROSS/BLUE SHIELD

## 2018-06-25 LAB — URINE CULTURE: Culture: <10000 — AB

## 2018-06-26 ENCOUNTER — Telehealth: Payer: Self-pay

## 2018-06-26 ENCOUNTER — Other Ambulatory Visit: Payer: BLUE CROSS/BLUE SHIELD

## 2018-06-26 ENCOUNTER — Ambulatory Visit: Payer: BLUE CROSS/BLUE SHIELD

## 2018-06-26 ENCOUNTER — Telehealth: Payer: Self-pay | Admitting: *Deleted

## 2018-06-26 NOTE — Telephone Encounter (Signed)
rec'd VM stating patient is having hematuria in foley. Patient states he was recently put on [place] patient. Got VM. LM for patient to call me.

## 2018-06-26 NOTE — Telephone Encounter (Signed)
Printed avs and calender of upcoming appointment. Per 10/18 los 

## 2018-06-27 ENCOUNTER — Emergency Department (HOSPITAL_COMMUNITY)
Admission: EM | Admit: 2018-06-27 | Discharge: 2018-06-27 | Disposition: A | Discharge status: Home / Self Care | Payer: BLUE CROSS/BLUE SHIELD | Attending: Emergency Medicine | Admitting: Emergency Medicine

## 2018-06-27 ENCOUNTER — Other Ambulatory Visit: Payer: Self-pay

## 2018-06-27 ENCOUNTER — Telehealth: Payer: Self-pay | Admitting: *Deleted

## 2018-06-27 ENCOUNTER — Emergency Department (HOSPITAL_COMMUNITY): Payer: BLUE CROSS/BLUE SHIELD

## 2018-06-27 ENCOUNTER — Encounter (HOSPITAL_COMMUNITY): Payer: Self-pay

## 2018-06-27 DIAGNOSIS — I252 Old myocardial infarction: Secondary | ICD-10-CM | POA: Insufficient documentation

## 2018-06-27 DIAGNOSIS — Z79899 Other long term (current) drug therapy: Secondary | ICD-10-CM | POA: Insufficient documentation

## 2018-06-27 DIAGNOSIS — R310 Gross hematuria: Secondary | ICD-10-CM | POA: Diagnosis not present

## 2018-06-27 DIAGNOSIS — R319 Hematuria, unspecified: Secondary | ICD-10-CM | POA: Diagnosis not present

## 2018-06-27 DIAGNOSIS — Z7901 Long term (current) use of anticoagulants: Secondary | ICD-10-CM | POA: Insufficient documentation

## 2018-06-27 DIAGNOSIS — N1339 Other hydronephrosis: Secondary | ICD-10-CM | POA: Diagnosis not present

## 2018-06-27 DIAGNOSIS — C7982 Secondary malignant neoplasm of genital organs: Secondary | ICD-10-CM | POA: Insufficient documentation

## 2018-06-27 DIAGNOSIS — I10 Essential (primary) hypertension: Secondary | ICD-10-CM | POA: Diagnosis not present

## 2018-06-27 LAB — CBC
HEMATOCRIT: 27.4 % — AB (ref 39.0–52.0)
Hemoglobin: 8.6 g/dL — ABNORMAL LOW (ref 13.0–17.0)
MCH: 32 pg (ref 26.0–34.0)
MCHC: 31.4 g/dL (ref 30.0–36.0)
MCV: 101.9 fL — ABNORMAL HIGH (ref 80.0–100.0)
NRBC: 0 % (ref 0.0–0.2)
Platelets: 564 10*3/uL — ABNORMAL HIGH (ref 150–400)
RBC: 2.69 MIL/uL — ABNORMAL LOW (ref 4.22–5.81)
RDW: 14.8 % (ref 11.5–15.5)
WBC: 7.7 10*3/uL (ref 4.0–10.5)

## 2018-06-27 LAB — BASIC METABOLIC PANEL
ANION GAP: 9 (ref 5–15)
BUN: 15 mg/dL (ref 6–20)
CO2: 28 mmol/L (ref 22–32)
CREATININE: 0.93 mg/dL (ref 0.61–1.24)
Calcium: 8.6 mg/dL — ABNORMAL LOW (ref 8.9–10.3)
Chloride: 106 mmol/L (ref 98–111)
GFR calc Af Amer: >60 mL/min (ref >60)
GFR calc non Af Amer: >60 mL/min (ref >60)
Glucose, Bld: 118 mg/dL — ABNORMAL HIGH (ref 70–99)
Potassium: 3.3 mmol/L — ABNORMAL LOW (ref 3.5–5.1)
Sodium: 143 mmol/L (ref 135–145)

## 2018-06-27 LAB — HEPATIC FUNCTION PANEL
ALT: 54 U/L — ABNORMAL HIGH (ref 0–44)
AST: 35 U/L (ref 15–41)
Albumin: 3.4 g/dL — ABNORMAL LOW (ref 3.5–5.0)
Alkaline Phosphatase: 53 U/L (ref 38–126)
BILIRUBIN DIRECT: 0.1 mg/dL (ref 0.0–0.2)
BILIRUBIN TOTAL: 0.6 mg/dL (ref 0.3–1.2)
Indirect Bilirubin: 0.5 mg/dL (ref 0.3–0.9)
Total Protein: 6.6 g/dL (ref 6.5–8.1)

## 2018-06-27 LAB — URINALYSIS, MICROSCOPIC (REFLEX)

## 2018-06-27 LAB — URINALYSIS, ROUTINE W REFLEX MICROSCOPIC

## 2018-06-27 LAB — APTT: aPTT: 55 seconds — ABNORMAL HIGH (ref 24–36)

## 2018-06-27 LAB — PROTIME-INR
INR: 2.24
Prothrombin Time: 24.5 seconds — ABNORMAL HIGH (ref 11.4–15.2)

## 2018-06-27 MED ORDER — SULFAMETHOXAZOLE-TRIMETHOPRIM 800-160 MG PO TABS: 1.0000 | ORAL_TABLET | Freq: Two times a day (BID) | ORAL | 0 refills | Status: AC

## 2018-06-27 MED ORDER — SULFAMETHOXAZOLE-TRIMETHOPRIM 800-160 MG PO TABS
1.0000 | ORAL_TABLET | Freq: Once | ORAL | Status: AC
  Administered 2018-06-27: 1 via ORAL
  Filled 2018-06-27: qty 1

## 2018-06-27 NOTE — Telephone Encounter (Signed)
Per dr Alen Blew, d/t hematuria,he should report to the ED. pateint verbalizes understanding.

## 2018-06-27 NOTE — ED Triage Notes (Signed)
Pt states that on Friday, he was dx with a blood clot in his groin. Pt states he has had consistent blood in his urine since then. Pt was seen for same on Firday after ahving it for an hour, but it has continued throughout the weekend and today.

## 2018-06-27 NOTE — ED Notes (Signed)
Foley irrigated and leg bag changed

## 2018-06-27 NOTE — Consult Note (Signed)
Urology Consult Note   Requesting Attending Physician:  Sharlett Iles, MD Service Providing Consult: Urology  Consulting Attending: Dr. Louis Meckel   Reason for Consult:  Gross hematuria  HPI: Dylan Gonzalez is seen in consultation for reasons noted above at the request of Little, Wenda Overland, MD for evaluation of gross hematuria.  This is a 52 y.o. male with an unfortunate diagnosis of metastatic pelvic leiomyosarcoma s/p recent TURP, TRUS biopsies of prostate and bilateral ureteral stent insertion in Aug 2019. He has had a chronic indwelling foley catheter since then which has been reportedly exchanged monthly. He has been undergoing systemic chemotherapy for his metastatic disease, recently s/p cycle 3 on Thursday last week.   He was seen in ED on Friday last week and diagnosed with a LE DVT and started on anticoagulation. Also noted to have gross hematuria through foley catheter at this time. Ultimately attributed to a UTI for which he was prescribed oral ciprofloxacin which he is still taking. He was having new suprapubic tenderness, dysuria at this time.   He returned to ED tonight for similar complaint. Gross hematuria present although no clots. Urine ranging from dark thin merlot to pink and thin without clots on irrigation. His Hgb is 8.6 from 9.6 and with stable vital signs and no complaints of symptomatic anemia. His creaitnine is 0.93.   A formal bladder US demonstrated an appropriately positioned catheter, decompressed bladder and no signs of intravesical clot formation.   Past Medical History: Past Medical History:  Diagnosis Date  . Hypertension   . MI (myocardial infarction) ([place])    2007    Past Surgical History:  Past Surgical History:  Procedure Laterality Date  . CYSTOSCOPY W/ URETERAL STENT PLACEMENT Bilateral 04/13/2018   Procedure: CYSTOSCOPY WITH BILATERAL STENT REPLACEMENT;  Surgeon: Irine Seal, MD;  Location: [facility];  Service: Urology;  Laterality:  Bilateral;  . eye removed    . HERNIA REPAIR    . INTRAOCULAR PROSTHESES INSERTION    . IR IMAGING GUIDED PORT INSERTION  06/21/2018  . PROSTATE BIOPSY [name] 04/13/2018   Procedure: ULTRASOUND GUIDED PROSTATE BIOPSY;  Surgeon: Irine Seal, MD;  Location: [facility];  Service: Urology;  Laterality: [name];  . TOOTH EXTRACTION Right 04/21/2013   Procedure: EXTRACTION MOLARS;  Surgeon: [name], DDS;  Location: [facility];  Service: Oral Surgery;  Laterality: Right;  . TRANSURETHRAL RESECTION OF PROSTATE  04/13/2018   Procedure: TRANSURETHRAL RESECTION OF THE PROSTATE (TURP);  Surgeon: Irine Seal, MD;  Location: [facility];  Service: Urology;;    Medication: No current facility-administered medications for this encounter.    Current Outpatient Medications  Medication Sig Dispense Refill  . ALPRAZolam (XANAX) 0.5 MG tablet Take 0.5 mg by mouth 2 (two) times daily as needed for anxiety.   0  . amLODipine (NORVASC) 10 MG tablet Take 1 tablet (10 mg total) by mouth daily. 30 tablet 1  . ciprofloxacin (CIPRO) 500 MG tablet Take 1 tablet (500 mg total) by mouth 2 (two) times daily for 7 days. 10 tablet 0  . Docusate Calcium (STOOL SOFTENER PO) Take 1 tablet by mouth daily.    . hydrochlorothiazide (HYDRODIURIL) 25 MG tablet Take 25 mg by mouth daily.   0  . HYDROcodone-acetaminophen (NORCO/VICODIN) 5-325 MG tablet Take 1 tablet by mouth every 6 (six) hours as needed for moderate pain. 60 tablet 0  . Hyoscyamine Sulfate SL (LEVSIN/SL) 0.125 MG SUBL Place 0.125 mg under the tongue every 8 (eight) hours as  needed. (Patient taking differently: Place 0.125 mg under the tongue every 8 (eight) hours as needed (cramping). ) 120 each 0  . metoprolol succinate (TOPROL-XL) 50 MG 24 hr tablet Take 50 mg by mouth daily.  1  . metoprolol tartrate (LOPRESSOR) 50 MG tablet Take 1 tablet (50 mg total) by mouth 2 (two) times daily. 60 tablet 1  . Multiple Vitamin (MULTIVITAMIN) tablet Take 1 tablet by mouth daily.    Marland Kitchen oxybutynin  (DITROPAN) 5 MG tablet Take 5 mg by mouth 3 (three) times daily.  3  . oxyCODONE (OXY IR/ROXICODONE) 5 MG immediate release tablet Take 1-2 tablets (5-10 mg total) by mouth every 4 (four) hours as needed for severe pain. 60 tablet 0  . polyethylene glycol (MIRALAX / GLYCOLAX) packet Take 17 g by mouth 2 (two) times daily as needed for mild constipation or moderate constipation. 14 each 0  . Rivaroxaban 15 & 20 MG TBPK Take as directed on package: Start with one 15mg  tablet by mouth twice a day with food. On Day 22, switch to one 20mg  tablet once a day with food. 51 each 0  . belladonna-opium (B&O SUPPRETTES) 16.2-30 MG suppository Place 1 suppository (30 mg total) rectally every 8 (eight) hours as needed for pain. (Patient not taking: Reported on 06/27/2018) 20 suppository 0  . cefUROXime (CEFTIN) 250 MG tablet Take 1 tablet (250 mg total) by mouth 2 (two) times daily with a meal. (Patient not taking: Reported on 06/27/2018) 20 tablet 0  . fluticasone (FLONASE) 50 MCG/ACT nasal spray PLACE 2 SPRAYS INTO BOTH NOSTRILS DAILY. (Patient taking differently: Place 2 sprays into both nostrils daily. ) 16 g 9  . hydrocortisone cream 1 % Apply to affected area 2 times daily (Patient not taking: Reported on 06/27/2018) 15 g 0  . hydrOXYzine (VISTARIL) 100 MG capsule TAKE ONE CAPSULE BY MOUTH AT BEDTIME AS NEEDED FOR ALLERGY (Patient taking differently: Take 100 mg by mouth at bedtime as needed (allergies). ) 30 capsule 5  . lidocaine-prilocaine (EMLA) cream Apply 1 application topically as needed. 30 g 0  . phenazopyridine (PYRIDIUM) 200 MG tablet Take 1 tablet (200 mg total) by mouth 3 (three) times daily as needed (burning). (Patient not taking: Reported on 06/27/2018) 30 tablet 0  . prochlorperazine (COMPAZINE) 10 MG tablet Take 1 tablet (10 mg total) by mouth every 6 (six) hours as needed for nausea or vomiting. (Patient not taking: Reported on 06/27/2018) 30 tablet 0    Allergies: Allergies  Allergen  Reactions  . Pollen Extract Other (See Comments)    Sneezing and shortness of breath    Social History: Social History   Tobacco Use  . Smoking status: Never Smoker  . Smokeless tobacco: Never Used  Substance Use Topics  . Alcohol use: No  . Drug use: No    Family History Family History  Problem Relation Age of Onset  . Hypertension Father   . Depression Brother     Review of Systems 10 systems were reviewed and are negative except as noted specifically in the HPI.  Objective   Vital signs in last 24 hours: BP 127/86 (BP Location: Right Arm)   Pulse 92   Temp 97.9 F (36.6 C) (Oral)   Resp 14   Ht 5\' 11"  (1.803 m)   Wt 94.4 kg   SpO2 100%   BMI 29.03 kg/m   Physical Exam General: NAD, A&O, resting, appropriate HEENT: [place]/AT, EOMI, MMM Pulmonary: Normal work of breathing Cardiovascular: HDS, adequate  peripheral perfusion Abdomen: Soft, NTTP, nondistended, mild to moderate suprapubic tenderness on palpation. GU: 72F 2 way catheter draining pink to medium dark red urine without clots. No clots with hand irrigation. Light to pink effluent with irrigation Extremities: warm and well perfused Neuro: Appropriate, no focal neurological deficits  Most Recent Labs: Lab Results  Component Value Date   WBC 7.7 06/27/2018   HGB 8.6 (L) 06/27/2018   HCT 27.4 (L) 06/27/2018   PLT 564 (H) 06/27/2018    Lab Results  Component Value Date   NA 143 06/27/2018   K 3.3 (L) 06/27/2018   CL 106 06/27/2018   CO2 28 06/27/2018   BUN 15 06/27/2018   CREATININE 0.93 06/27/2018   CALCIUM 8.6 (L) 06/27/2018   MG 2.0 04/17/2018    Lab Results  Component Value Date   INR 2.24 06/27/2018   APTT 55 (H) 06/27/2018     IMAGING: [place] [facility] [employer]  Result Date: 06/27/2018 CLINICAL DATA:  Prostate leiomyosarcoma. Bilateral nephroureteral stents. Request to evaluate bladder for Foley catheter position and for bladder clots. EXAM: ULTRASOUND RETROPERITONEAL LIMITED  TECHNIQUE: Ultrasound examination of the abdominal aorta was performed to evaluate for abdominal aortic aneurysm. The common iliac arteries, IVC, and kidneys were also evaluated. COMPARISON:  05/22/2018 CT abdomen/pelvis. FINDINGS: Cursory views of kidneys bilaterally demonstrate mild bilateral hydronephrosis, which appears similar to slightly worsened from the recent CT. The bladder is decompressed with indwelling Foley catheter. No mass is seen within the bladder. Visualization of the bladder is limited by decompressed state and by mass effect from the large posterior pelvic mass. Redemonstrated is a large 15.2 x 10.0 x 8.9 cm mass posterior to the bladder with mixed cystic and solid internal echoes. IMPRESSION: 1. Bladder decompressed by indwelling Foley catheter. No mass/clot demonstrated within the bladder. 2. Redemonstration of large heterogeneous pelvic mass posterior to the bladder. 3. Mild bilateral hydronephrosis, similar to slightly worsened from 05/22/2018 CT. Electronically Signed   By: Ilona Sorrel M.D.   On: 06/27/2018 20:30    ------  Assessment:  52 y.o. male with a history of metastatic pelvic leiomyosarcoma undergoing systemic therapy, recently started on therapeutic anticoagulation for DVT, now with gross hematuria via chronic indwelling foley catheter x 4-5 days.   Based on clinical history and exam, hematuria is likely still attributable to UTI (possibly undertreated) vs catheter irritation vs bilateral ureteral stent irritation in the setting of new anticoagulation. The catheter is draining well, without clots, and there is no evidence of clot within the bladder. As his Hgb is fairly stable, vitals stable, and he does not shows signs of symptomatic anemia, I would not push for any further interventions at this time. I did talk to him about the indications for transfusion, 3-way catheter placement and CBI, as well as cystoscopy and fulguration, all of which may be premature at this  time.    Recommendations: - Please send new urine culture  - May consider switching to a different antibiotic as prior culture data was inconclusive. Consider PO Bactrim treatment until new culture returns. He may have undertreated UTI - Maintain 72F foley catheter for now. He is okay to discharge with this in place. As long as catheter draining well and he remains asymptomatic from anemic standpoint it is reasonable to watch.  - I discussed that arranging clinic follow up with [place] or oncologist next week for catheter check +/- repeat Hgb would be recommended   Thank you for this consult. Please contact the urology consult  pager with any further questions/concerns.

## 2018-06-27 NOTE — Telephone Encounter (Signed)
He needs an ED evaluation.

## 2018-06-27 NOTE — Discharge Instructions (Addendum)
Follow-up with a urologist or oncologist within about a week for repeat evaluation and repeat testing of the hemoglobin.  Stop using the Cipro.  Begin taking the Bactrim. Please take all of your antibiotics until finished!   You may develop abdominal discomfort or diarrhea from the antibiotic.  You may help offset this with probiotics which you can buy or get in yogurt. Do not eat or take the probiotics until 2 hours after your antibiotic.   Return to the ED for shortness of breath, chest pain, fever, abdominal pain, dizziness, passing out, clots in the urine, worsening bleeding, or any other major concerns.

## 2018-06-27 NOTE — ED Provider Notes (Signed)
[facility] DEPT Provider Note   CSN: [id number] Arrival date & time: 06/27/18  1609     History   Chief Complaint Chief Complaint  Patient presents with  . Hematuria    HPI Dylan Gonzalez is a 52 y.o. male.  HPI   Dylan Gonzalez is a 52 y.o. 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  Patient states he was placed on Xarelto starting the same day due to a DVT found in the left upper leg.  That evening, he began to have frank hematuria in his Foley catheter bag.  Feels as though it is worsening.  He states he tried to contact his urologist, Dr. Roni Bread, without success. He contacted the office of his oncologist, Dr. Osker Mason, and was told to come to the ED. He was seen in the ED on Oct 18 due to hematuria. There was some additional concern for infection due to penile discharge and suprapubic discomfort. His foley was changed out and he was placed on cipro, with which he has been compliant.  Denies fever, abdominal pain, shortness of breath, chest pain, pain with bowel movements, N/V/D, bleeding from anywhere else, or any other complaints.     Past Medical History:  Diagnosis Date  . Hypertension   . MI (myocardial infarction) [facility])    2007    Patient Active Problem List   Diagnosis Date Noted  . Port-A-Cath in place 06/29/2018  . Goals of care, counseling/discussion 05/15/2018  . Bilateral ureteral obstruction 04/19/2018  . Malignant neoplasm metastatic to lung ([place]) 04/19/2018  . Primary leiomyosarcoma of intra-abdominal site ([facility]) 04/18/2018  . Prostate neoplasm 04/13/2018  . Hypertension 12/26/2011  . 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 CYSTOSCOPY W/ URETERAL STENT PLACEMENT Bilateral 04/13/2018   Procedure: CYSTOSCOPY WITH BILATERAL STENT  REPLACEMENT;  Surgeon: Irine Seal, MD;  Location: [facility];  Service: Urology;  Laterality: Bilateral;  . eye removed    . HERNIA REPAIR    . INTRAOCULAR PROSTHESES INSERTION    . IR IMAGING GUIDED PORT INSERTION  06/21/2018  . PROSTATE BIOPSY [name] 04/13/2018   Procedure: ULTRASOUND GUIDED PROSTATE BIOPSY;  Surgeon: Irine Seal, MD;  Location: [facility];  Service: Urology;  Laterality: [name];  . TOOTH EXTRACTION Right 04/21/2013   Procedure: EXTRACTION MOLARS;  Surgeon: [name], DDS;  Location: [facility];  Service: Oral Surgery;  Laterality: Right;  . TRANSURETHRAL RESECTION OF PROSTATE  04/13/2018   Procedure: TRANSURETHRAL RESECTION OF THE PROSTATE (TURP);  Surgeon: Irine Seal, MD;  Location: [facility];  Service: Urology;;        Home Medications    Prior to Admission medications   Medication Sig Start Date End Date Taking? Authorizing Provider  ALPRAZolam Duanne Moron) 0.5 MG tablet Take 0.5 mg by mouth 2 (two) times daily as needed for anxiety.  05/31/18  Yes [provider]  amLODipine (NORVASC) 10 MG tablet Take 1 tablet (10 mg total) by mouth daily. 04/19/18  Yes Georgette Shell, MD  Docusate Calcium (STOOL SOFTENER PO) Take 1 tablet by mouth daily.   Yes [provider]  hydrochlorothiazide (HYDRODIURIL) 25 MG tablet Take 25 mg by mouth daily.  05/02/18  Yes [provider]  HYDROcodone-acetaminophen (NORCO/VICODIN) 5-325 MG tablet Take 1 tablet by mouth every 6 (six) hours as needed for  moderate pain. 05/15/18 05/15/19 Yes [name], Mathis Dad, MD  Hyoscyamine Sulfate SL (LEVSIN/SL) 0.125 MG SUBL Place 0.125 mg under the tongue every 8 (eight) hours as needed. Patient taking differently: Place 0.125 mg under the tongue every 8 (eight) hours as needed (cramping).  04/19/18  Yes Georgette Shell, MD  metoprolol succinate (TOPROL-XL) 50 MG 24 hr tablet Take 50 mg by mouth daily. 05/17/18  Yes [provider]  metoprolol tartrate (LOPRESSOR) 50 MG tablet Take 1 tablet  (50 mg total) by mouth 2 (two) times daily. 04/19/18  Yes Georgette Shell, MD  Multiple Vitamin (MULTIVITAMIN) tablet Take 2 tablets by mouth daily.    Yes [provider]  oxybutynin (DITROPAN) 5 MG tablet Take 5 mg by mouth 3 (three) times daily. 06/04/18  Yes [provider]  polyethylene glycol (MIRALAX / GLYCOLAX) packet Take 17 g by mouth 2 (two) times daily as needed for mild constipation or moderate constipation. 04/23/18  Yes Pollina, Gwenyth Allegra, MD  Rivaroxaban 15 & 20 MG TBPK Take as directed on package: Start with one 15mg  tablet by mouth twice a day with food. On Day 22, switch to one 20mg  tablet once a day with food. 06/23/18  Yes [name], MD  albuterol (PROVENTIL HFA) 108 (90 Base) MCG/ACT inhaler Inhale 1-2 puffs into the lungs every 6 (six) hours as needed for wheezing or shortness of breath.    [provider]  belladonna-opium (B&O SUPPRETTES) 16.2-30 MG suppository Place 1 suppository (30 mg total) rectally every 8 (eight) hours as needed for pain. Patient not taking: Reported on 06/27/2018 04/23/18   Orpah Greek, MD  cefUROXime (CEFTIN) 250 MG tablet Take 1 tablet (250 mg total) by mouth 2 (two) times daily with a meal. Patient not taking: Reported on 06/29/2018 04/23/18   Orpah Greek, MD  fluticasone (FLONASE) 50 MCG/ACT nasal spray PLACE 2 SPRAYS INTO BOTH NOSTRILS DAILY. Patient taking differently: Place 2 sprays into both nostrils daily.  07/22/14   Le, Thao P, DO  hydrocortisone cream 1 % Apply to affected area 2 times daily Patient not taking: Reported on 06/27/2018 08/05/17   Langston Masker B, PA-C  hydrOXYzine (VISTARIL) 100 MG capsule TAKE ONE CAPSULE BY MOUTH AT BEDTIME AS NEEDED FOR ALLERGY Patient not taking: Reported on 06/29/2018 10/12/15   Robyn Haber, MD  lidocaine-prilocaine (EMLA) cream Apply 1 application topically as needed. 06/22/18   [name], MD  oxyCODONE (OXY IR/ROXICODONE) 5 MG  immediate release tablet Take 1-2 tablets (5-10 mg total) by mouth every 4 (four) hours as needed for severe pain. 06/29/18   [name], MD  phenazopyridine (PYRIDIUM) 200 MG tablet Take 1 tablet (200 mg total) by mouth 3 (three) times daily as needed (burning). 04/19/18   Georgette Shell, MD  prochlorperazine (COMPAZINE) 10 MG tablet Take 1 tablet (10 mg total) by mouth every 6 (six) hours as needed for nausea or vomiting. 05/15/18   [name], MD  sulfamethoxazole-trimethoprim (BACTRIM DS,SEPTRA DS) 800-160 MG tablet Take 1 tablet by mouth 2 (two) times daily for 10 days. 06/27/18 07/07/18  Lorayne Bender, PA-C    Family History Family History  Problem Relation Age of Onset  . Hypertension Father   . Depression Brother     Social History Social History   Tobacco Use  . Smoking status: Never Smoker  . Smokeless tobacco: Never Used  Substance Use Topics  . Alcohol use: No  . Drug use: No  Allergies   Pollen extract   Review of Systems Review of Systems  Constitutional: Negative for fever.  Respiratory: Negative for shortness of breath.   Cardiovascular: Negative for chest pain.  Gastrointestinal: Negative for abdominal pain, diarrhea, nausea and vomiting.  Genitourinary: Positive for hematuria. Negative for penile pain, scrotal swelling and testicular pain.  Neurological: Negative for syncope and weakness.  All other systems reviewed and are negative.    Physical Exam Updated Vital Signs BP 118/80 (BP Location: Left Arm)   Pulse 98   Temp 97.9 F (36.6 C) (Oral)   Resp 16   Ht 5\' 11"  (1.803 m)   Wt 94.4 kg   SpO2 96%   BMI 29.03 kg/m   Physical Exam  Constitutional: He appears well-developed and well-nourished. No distress.  HENT:  Head: Normocephalic and atraumatic.  Eyes: Conjunctivae are normal.  Neck: Neck supple.  Cardiovascular: Normal rate, regular rhythm, normal heart sounds and intact distal pulses.  Pulmonary/Chest: Effort normal  and breath sounds normal. No respiratory distress.  Abdominal: Soft. There is no tenderness. There is no guarding.  Genitourinary:  Genitourinary Comments: Patient has a Foley catheter in place with a leg bag.  There is what appears to be frank hematuria in the collection bag. There is no noted penile discharge from around the Foley.  Penis, scrotum, and testicles are nontender and without noted swelling.  Musculoskeletal: He exhibits no edema.  Lymphadenopathy:    He has no cervical adenopathy.  Neurological: He is alert.  Skin: Skin is warm and dry. He is not diaphoretic.  Psychiatric: He has a normal mood and affect. His behavior is normal.  Nursing note and vitals reviewed.  Collected over the course of an hour:     ED Treatments / Results  Labs (all labs ordered are listed, but only abnormal results are displayed) Labs Reviewed  URINALYSIS, ROUTINE W REFLEX MICROSCOPIC - Abnormal; Notable for the following components:      Result Value   Color, Urine RED (*)    APPearance TURBID (*)    Glucose, UA   (*)    Value: TEST NOT REPORTED DUE TO COLOR INTERFERENCE OF URINE PIGMENT   Hgb urine dipstick   (*)    Value: TEST NOT REPORTED DUE TO COLOR INTERFERENCE OF URINE PIGMENT   Bilirubin Urine   (*)    Value: TEST NOT REPORTED DUE TO COLOR INTERFERENCE OF URINE PIGMENT   Ketones, ur   (*)    Value: TEST NOT REPORTED DUE TO COLOR INTERFERENCE OF URINE PIGMENT   Protein, ur   (*)    Value: TEST NOT REPORTED DUE TO COLOR INTERFERENCE OF URINE PIGMENT   Nitrite   (*)    Value: TEST NOT REPORTED DUE TO COLOR INTERFERENCE OF URINE PIGMENT   Leukocytes, UA   (*)    Value: TEST NOT REPORTED DUE TO COLOR INTERFERENCE OF URINE PIGMENT   All other components within normal limits  BASIC METABOLIC PANEL - Abnormal; Notable for the following components:   Potassium 3.3 (*)    Glucose, Bld 118 (*)    Calcium 8.6 (*)    All other components within normal limits  CBC - Abnormal; Notable  for the following components:   RBC 2.69 (*)    Hemoglobin 8.6 (*)    HCT 27.4 (*)    MCV 101.9 (*)    Platelets 564 (*)    All other components within normal limits  HEPATIC FUNCTION PANEL - Abnormal; Notable  for the following components:   Albumin 3.4 (*)    ALT 54 (*)    All other components within normal limits  PROTIME-INR - Abnormal; Notable for the following components:   Prothrombin Time 24.5 (*)    All other components within normal limits  APTT - Abnormal; Notable for the following components:   aPTT 55 (*)    All other components within normal limits  URINALYSIS, MICROSCOPIC (REFLEX) - Abnormal; Notable for the following components:   Bacteria, UA FIELD OBSCURED BY RBC'S (*)    All other components within normal limits    Hemoglobin  Date Value Ref Range Status  06/27/2018 8.6 (L) 13.0 - 17.0 g/dL Final  06/23/2018 9.6 (L) 13.0 - 17.0 g/dL Final  06/22/2018 9.9 (L) 13.0 - 17.0 g/dL Final  06/21/2018 10.7 (L) 13.0 - 17.0 g/dL Final  06/12/2018 11.1 (L) 13.0 - 17.1 g/dL Final  06/07/2018 12.0 (L) 13.0 - 17.1 g/dL Final  05/30/2018 12.0 (L) 13.0 - 17.1 g/dL Final    EKG None  Radiology  [employer] [facility]  Result Date: 06/27/2018 CLINICAL DATA:  Prostate leiomyosarcoma. Bilateral nephroureteral stents. Request to evaluate bladder for Foley catheter position and for bladder clots. EXAM: ULTRASOUND RETROPERITONEAL LIMITED TECHNIQUE: Ultrasound examination of the abdominal aorta was performed to evaluate for abdominal aortic aneurysm. The common iliac arteries, IVC, and kidneys were also evaluated. COMPARISON:  05/22/2018 CT abdomen/pelvis. FINDINGS: Cursory views of kidneys bilaterally demonstrate mild bilateral hydronephrosis, which appears similar to slightly worsened from the recent CT. The bladder is decompressed with indwelling Foley catheter. No mass is seen within the bladder. Visualization of the bladder is limited by decompressed state and by mass effect  from the large posterior pelvic mass. Redemonstrated is a large 15.2 x 10.0 x 8.9 cm mass posterior to the bladder with mixed cystic and solid internal echoes. IMPRESSION: 1. Bladder decompressed by indwelling Foley catheter. No mass/clot demonstrated within the bladder. 2. Redemonstration of large heterogeneous pelvic mass posterior to the bladder. 3. Mild bilateral hydronephrosis, similar to slightly worsened from 05/22/2018 CT. Electronically Signed   By: Ilona Sorrel M.D.   On: 06/27/2018 20:30    Procedures Procedures (including critical care time)  Medications Ordered in ED Medications  sulfamethoxazole-trimethoprim (BACTRIM DS,SEPTRA DS) 800-160 MG per tablet 1 tablet (1 tablet Oral Given 06/27/18 2148)     Initial Impression / Assessment and Plan / ED Course  I have reviewed the triage vital signs and the nursing notes.  Pertinent labs & imaging results that were available during my care of the patient were reviewed by me and considered in my medical decision making (see chart for details).  Clinical Course as of Jul 01 915  Tue Jun 27, 2018  [place] with Basilio Cairo, urology resident.  Recommends focused ultrasound of the bladder to assess for clots.  Irrigate the Foley in the bladder to also assess for clots.  If clots are present, can put in a three-way catheter.  Otherwise can be discharged from his standpoint and follow-up in the office.   [SJ]  1904 Spoke with [place] tech after [place] was performed. States she noted some mild hydronephrosis bilaterally.   [SJ]  2026 Erlene Quan called to check on patient. Requests foley cart. He will come assess patient.   [SJ]  2108 Spoke with Erlene Quan. States patient can be discharged.  Follow-up in the office with urology or oncology within a week. Discontinue Cipro, but give the patient prescription for 10 days of Bactrim.   [  SJ]    Clinical Course User Index [SJ] Aiko Belko C, PA-C   Patient presents with hematuria.  He denies other  complaints.  He does have a change in his hemoglobin from his ED visit a few days ago, however, he does not seem to be symptomatic to this.  Patient is nontoxic appearing, afebrile, not tachycardic, not tachypneic, not hypotensive, maintains excellent SPO2 on room air, and is in no apparent distress.  Extensive discussion was had between myself, urology resident, and the patient.  We agreed upon a plan for discharge with patient to follow-up within the next week. Return precautions discussed. Patient voices understanding of these instructions, accepts the plan, and is comfortable with discharge.  Findings and plan of care discussed with Theotis Burrow, MD.  Vitals:   06/27/18 1941 06/27/18 2013 06/27/18 2044 06/27/18 2114  BP: 115/72 127/86 127/86 127/86  Pulse: 87 (!) 106 92 82  Resp: 20 (!) 23 14 (!) 22  Temp:      TempSrc:      SpO2: 100% 100% 100% 98%  Weight:      Height:         Final Clinical Impressions(s) / ED Diagnoses   Final diagnoses:  Gross hematuria    ED Discharge Orders         Ordered    sulfamethoxazole-trimethoprim (BACTRIM DS,SEPTRA DS) 800-160 MG tablet  2 times daily     06/27/18 2131           Lorayne Bender, PA-C 06/30/18 0920    Little, Wenda Overland, MD 07/02/18 1024

## 2018-06-27 NOTE — Telephone Encounter (Signed)
Patient calling to say he has blood in his urine. States he is on xaralto.

## 2018-06-28 ENCOUNTER — Ambulatory Visit: Payer: BLUE CROSS/BLUE SHIELD

## 2018-06-29 ENCOUNTER — Encounter: Payer: Self-pay | Admitting: Oncology

## 2018-06-29 ENCOUNTER — Inpatient Hospital Stay: Payer: BLUE CROSS/BLUE SHIELD

## 2018-06-29 ENCOUNTER — Telehealth: Payer: Self-pay | Admitting: Medical

## 2018-06-29 ENCOUNTER — Other Ambulatory Visit: Payer: Self-pay

## 2018-06-29 ENCOUNTER — Emergency Department (HOSPITAL_COMMUNITY)
Admission: EM | Admit: 2018-06-29 | Discharge: 2018-06-29 | Disposition: A | Discharge status: Home / Self Care | Payer: BLUE CROSS/BLUE SHIELD | Source: Home / Self Care | Attending: Emergency Medicine | Admitting: Emergency Medicine

## 2018-06-29 ENCOUNTER — Emergency Department (HOSPITAL_COMMUNITY): Payer: BLUE CROSS/BLUE SHIELD

## 2018-06-29 ENCOUNTER — Inpatient Hospital Stay (HOSPITAL_BASED_OUTPATIENT_CLINIC_OR_DEPARTMENT_OTHER): Payer: BLUE CROSS/BLUE SHIELD | Admitting: Medical

## 2018-06-29 VITALS — BP 125/87 | HR 98 | Temp 98.4°F | Resp 18

## 2018-06-29 DIAGNOSIS — R000 Tachycardia, unspecified: Secondary | ICD-10-CM | POA: Diagnosis not present

## 2018-06-29 DIAGNOSIS — C494 Malignant neoplasm of connective and soft tissue of abdomen: Secondary | ICD-10-CM

## 2018-06-29 DIAGNOSIS — C7800 Secondary malignant neoplasm of unspecified lung: Secondary | ICD-10-CM

## 2018-06-29 DIAGNOSIS — C61 Malignant neoplasm of prostate: Secondary | ICD-10-CM

## 2018-06-29 DIAGNOSIS — R072 Precordial pain: Secondary | ICD-10-CM

## 2018-06-29 DIAGNOSIS — Z79899 Other long term (current) drug therapy: Secondary | ICD-10-CM

## 2018-06-29 DIAGNOSIS — R0602 Shortness of breath: Secondary | ICD-10-CM | POA: Diagnosis not present

## 2018-06-29 DIAGNOSIS — I82412 Acute embolism and thrombosis of left femoral vein: Secondary | ICD-10-CM

## 2018-06-29 DIAGNOSIS — I10 Essential (primary) hypertension: Secondary | ICD-10-CM

## 2018-06-29 DIAGNOSIS — R079 Chest pain, unspecified: Secondary | ICD-10-CM | POA: Diagnosis not present

## 2018-06-29 DIAGNOSIS — R319 Hematuria, unspecified: Secondary | ICD-10-CM

## 2018-06-29 DIAGNOSIS — I252 Old myocardial infarction: Secondary | ICD-10-CM

## 2018-06-29 DIAGNOSIS — I219 Acute myocardial infarction, unspecified: Secondary | ICD-10-CM

## 2018-06-29 DIAGNOSIS — Z95828 Presence of other vascular implants and grafts: Secondary | ICD-10-CM | POA: Insufficient documentation

## 2018-06-29 DIAGNOSIS — R0789 Other chest pain: Secondary | ICD-10-CM

## 2018-06-29 DIAGNOSIS — D649 Anemia, unspecified: Secondary | ICD-10-CM

## 2018-06-29 LAB — CBC WITH DIFFERENTIAL/PLATELET
Abs Immature Granulocytes: 0.07 10*3/uL (ref 0.00–0.07)
BASOS PCT: 1 %
Basophils Absolute: 0.1 10*3/uL (ref 0.0–0.1)
Eosinophils Absolute: 0 10*3/uL (ref 0.0–0.5)
Eosinophils Relative: 0 %
HCT: 26.2 % — ABNORMAL LOW (ref 39.0–52.0)
Hemoglobin: 7.9 g/dL — ABNORMAL LOW (ref 13.0–17.0)
IMMATURE GRANULOCYTES: 1 %
Lymphocytes Relative: 9 %
Lymphs Abs: 0.8 10*3/uL (ref 0.7–4.0)
MCH: 31.7 pg (ref 26.0–34.0)
MCHC: 30.2 g/dL (ref 30.0–36.0)
MCV: 105.2 fL — ABNORMAL HIGH (ref 80.0–100.0)
Monocytes Absolute: 0.4 10*3/uL (ref 0.1–1.0)
Monocytes Relative: 5 %
NEUTROS ABS: 7 10*3/uL (ref 1.7–7.7)
NEUTROS PCT: 84 %
NRBC: 0.2 % (ref 0.0–0.2)
Platelets: 393 10*3/uL (ref 150–400)
RBC: 2.49 MIL/uL — AB (ref 4.22–5.81)
RDW: 14.8 % (ref 11.5–15.5)
WBC: 8.3 10*3/uL (ref 4.0–10.5)

## 2018-06-29 LAB — CMP (CANCER CENTER ONLY)
ALBUMIN: 3.2 g/dL — AB (ref 3.5–5.0)
ALT: 40 U/L (ref 0–44)
AST: 25 U/L (ref 15–41)
Alkaline Phosphatase: 64 U/L (ref 38–126)
Anion gap: 13 (ref 5–15)
BUN: 9 mg/dL (ref 6–20)
CHLORIDE: 107 mmol/L (ref 98–111)
CO2: 24 mmol/L (ref 22–32)
Calcium: 8.8 mg/dL — ABNORMAL LOW (ref 8.9–10.3)
Creatinine: 1.21 mg/dL (ref 0.61–1.24)
GFR, Est AFR Am: >60 mL/min (ref >60)
GLUCOSE: 159 mg/dL — AB (ref 70–99)
POTASSIUM: 3.4 mmol/L — AB (ref 3.5–5.1)
SODIUM: 144 mmol/L (ref 135–145)
Total Bilirubin: 0.3 mg/dL (ref 0.3–1.2)
Total Protein: 6.4 g/dL — ABNORMAL LOW (ref 6.5–8.1)

## 2018-06-29 LAB — I-STAT CHEM 8, ED
BUN: 7 mg/dL (ref 6–20)
CALCIUM ION: 1.18 mmol/L (ref 1.15–1.40)
CHLORIDE: 107 mmol/L (ref 98–111)
Creatinine, Ser: 1.2 mg/dL (ref 0.61–1.24)
Glucose, Bld: 145 mg/dL — ABNORMAL HIGH (ref 70–99)
HEMATOCRIT: 23 % — AB (ref 39.0–52.0)
Hemoglobin: 7.8 g/dL — ABNORMAL LOW (ref 13.0–17.0)
Potassium: 3.5 mmol/L (ref 3.5–5.1)
Sodium: 142 mmol/L (ref 135–145)
TCO2: 26 mmol/L (ref 22–32)

## 2018-06-29 LAB — CBC WITH DIFFERENTIAL (CANCER CENTER ONLY)
ABS IMMATURE GRANULOCYTES: 0.03 10*3/uL (ref 0.00–0.07)
Basophils Absolute: 0.1 10*3/uL (ref 0.0–0.1)
Basophils Relative: 2 %
Eosinophils Absolute: 0.1 10*3/uL (ref 0.0–0.5)
Eosinophils Relative: 1 %
HCT: 26.5 % — ABNORMAL LOW (ref 39.0–52.0)
HEMOGLOBIN: 8.4 g/dL — AB (ref 13.0–17.0)
Immature Granulocytes: 1 %
LYMPHS PCT: 35 %
Lymphs Abs: 1.7 10*3/uL (ref 0.7–4.0)
MCH: 32.1 pg (ref 26.0–34.0)
MCHC: 31.7 g/dL (ref 30.0–36.0)
MCV: 101.1 fL — ABNORMAL HIGH (ref 80.0–100.0)
MONO ABS: 0.6 10*3/uL (ref 0.1–1.0)
Monocytes Relative: 12 %
NEUTROS ABS: 2.4 10*3/uL (ref 1.7–7.7)
Neutrophils Relative %: 49 %
Platelet Count: 418 10*3/uL — ABNORMAL HIGH (ref 150–400)
RBC: 2.62 MIL/uL — AB (ref 4.22–5.81)
RDW: 14.6 % (ref 11.5–15.5)
WBC: 4.8 10*3/uL (ref 4.0–10.5)
nRBC: 0.4 % — ABNORMAL HIGH (ref 0.0–0.2)

## 2018-06-29 LAB — I-STAT TROPONIN, ED: Troponin i, poc: 0 ng/mL (ref 0.00–0.08)

## 2018-06-29 MED ORDER — ASPIRIN 325 MG PO TABS
ORAL_TABLET | ORAL | Status: AC
  Filled 2018-06-29: qty 1

## 2018-06-29 MED ORDER — HEPARIN SOD (PORK) LOCK FLUSH 100 UNIT/ML IV SOLN
500.0000 [IU] | Freq: Once | INTRAVENOUS | Status: DC | PRN
  Filled 2018-06-29: qty 5

## 2018-06-29 MED ORDER — HEPARIN SOD (PORK) LOCK FLUSH 100 UNIT/ML IV SOLN
500.0000 [IU] | Freq: Once | INTRAVENOUS | Status: AC
  Administered 2018-06-29: 500 [IU]
  Filled 2018-06-29: qty 5

## 2018-06-29 MED ORDER — SODIUM CHLORIDE 0.9% FLUSH
10.0000 mL | INTRAVENOUS | Status: DC | PRN
  Filled 2018-06-29: qty 10

## 2018-06-29 MED ORDER — DEXAMETHASONE SODIUM PHOSPHATE 10 MG/ML IJ SOLN
10.0000 mg | Freq: Once | INTRAMUSCULAR | Status: AC
  Administered 2018-06-29: 10 mg via INTRAVENOUS

## 2018-06-29 MED ORDER — ASPIRIN 325 MG PO TABS
325.0000 mg | ORAL_TABLET | Freq: Every day | ORAL | Status: DC
  Administered 2018-06-29: 325 mg via ORAL

## 2018-06-29 MED ORDER — OXYCODONE HCL 5 MG PO TABS: 5.0000 mg | ORAL_TABLET | ORAL | 0 refills | Status: DC | PRN

## 2018-06-29 MED ORDER — SODIUM CHLORIDE 0.9% FLUSH
10.0000 mL | Freq: Once | INTRAVENOUS | Status: AC
  Administered 2018-06-29: 10 mL
  Filled 2018-06-29: qty 10

## 2018-06-29 MED ORDER — SODIUM CHLORIDE 0.9 % IV SOLN
2000.0000 mg | Freq: Once | INTRAVENOUS | Status: DC
  Filled 2018-06-29: qty 53

## 2018-06-29 MED ORDER — DEXAMETHASONE SODIUM PHOSPHATE 10 MG/ML IJ SOLN
INTRAMUSCULAR | Status: AC
  Filled 2018-06-29: qty 1

## 2018-06-29 MED ORDER — SODIUM CHLORIDE 0.9 % IV SOLN
Freq: Once | INTRAVENOUS | Status: AC
  Administered 2018-06-29: 09:00:00 via INTRAVENOUS
  Filled 2018-06-29: qty 250

## 2018-06-29 MED ORDER — NITROGLYCERIN 0.4 MG SL SUBL
SUBLINGUAL_TABLET | SUBLINGUAL | Status: AC
  Filled 2018-06-29: qty 1

## 2018-06-29 MED ORDER — NITROGLYCERIN 0.4 MG SL SUBL
0.4000 mg | SUBLINGUAL_TABLET | SUBLINGUAL | Status: DC | PRN
  Administered 2018-06-29: 0.4 mg via SUBLINGUAL

## 2018-06-29 MED ORDER — SODIUM CHLORIDE 0.9 % IV SOLN
100.0000 mg/m2 | Freq: Once | INTRAVENOUS | Status: AC
  Administered 2018-06-29: 220 mg via INTRAVENOUS
  Filled 2018-06-29: qty 22

## 2018-06-29 NOTE — Progress Notes (Signed)
10:08 - Patient pushed the call button.  Patient stated that he had chest pain and tightness of 7/10.  He also stated having difficulty breathing.  10:11 - Patients RN read vitals of 152/99, 96% O2 on RA, HR of 100.  Sandi Mealy was at bedside and an EKG was started.  10:15 - Patient was given 325 Aspirin and 0.3 Nitro.  10:16 - Vitals read 144/98, HR 102, O2 of 98%  10:19 - Patient stated 0/10 pain  Sandi Mealy spoke with Dr. Alen Blew and the decision was made to take the patient to the ED.

## 2018-06-29 NOTE — ED Notes (Signed)
Bed: PU92 Expected date:  Expected time:  Means of arrival:  Comments: [facility]

## 2018-06-29 NOTE — Progress Notes (Signed)
Went to visit patient in infusion to inquire about income documents for grant. Patient states he forgot and is not feeling well. Advised him he has my card for any questions or concerns once feeling better to revisit. He verbalized understanding.

## 2018-06-29 NOTE — ED Notes (Signed)
RN ambulated pt in hall on portable O2 monitor. Pt's O2 stayed between 96-100% on RA. Pt's HR went up to 125 while RN was assisting pt and he reported feeling like his legs were heavy and fatigued.  Pt assisted back to bed and his HR returned down to 94 within 2 minutes. Pt reports he has no pain at this time and did not feel SOB.

## 2018-06-29 NOTE — Progress Notes (Signed)
[facility] Progress Note   Dylan Gonzalez [id number] 09/10/1965 52 y.o.  Dylan Gonzalez is managed by Dr. Alen Blew  Actively treated with chemotherapy/immunotherapy: yes  Current Therapy: Docetaxel  Last Treated: 06/22/2018 (cycle 2, day 1)  Assessment: Plan:    Primary leiomyosarcoma of intra-abdominal site [facility])  Precordial pain  Myocardial infarction, unspecified MI type, unspecified artery [facility])   Primary leiomyosarcoma: Mr. Goodrich is followed by Dr. Alen Blew and is status post cycle 2-day 1 of docetaxel which was dosed on 06/22/2018.  Chest pain with a distant history of a myocardial infarction: An EKG was completed on the patient today which showed sinus tachycardia with no acute findings. Mr. Ehle was given sublingual nitro glycerin 0.4 mg x 1 and aspirin 325 mg to chew x1.  The patient was transported to the emergency room for further evaluation and management.  Please see After Visit Summary for patient specific instructions.  Future Appointments  Date Time Provider [place]  07/01/2018 11:30 AM [facility] [place] FLUSH [facility] None  07/10/2018  8:45 AM [facility] LAB 5 [facility] None  07/10/2018  9:00 AM [facility] [place] FLUSH [facility] None  07/10/2018  9:30 AM [name], Mathis Dad, MD [facility] None  07/10/2018 10:15 AM [facility] INFUSION [facility] None  07/17/2018  8:15 AM [facility] LAB 2 [facility] None  07/17/2018  8:30 AM [facility] [place] FLUSH [facility] None  07/17/2018  9:00 AM [facility] INFUSION [facility] None  07/19/2018 10:00 AM [facility] [place] FLUSH [facility] None    No orders of the defined types were placed in this encounter.      Subjective:   Patient ID:  Dylan Gonzalez is a 52 y.o. (DOB June 07, 1966) male.  Chief Complaint: No chief complaint on file.   HPI Dylan Gonzalez is a 52 year old male with a biopsy-proven high-grade leiomyosarcoma of the prostate.  He is followed by Dr. Alen Blew and is status post cycle 2,  day 1 of docetaxel which was given on 06/22/2018.  The patient additionally has a history of an acute DVT.  He is currently on Xarelto.  He was seen urgently in the infusion room today as he was receiving fluids prior to beginning cycle 2, day 8 of chemotherapy.  He was seen for 7/10 chest pressure with shortness of breath which developed acutely.  He has a history of an MI dating to approximately 10 years ago.  He is not followed by cardiology.  He denies radiation of his pain and is not diaphoretic.  The patient's blood pressure initially was noted to be 152/99 with a pulse of 103 and oxygen saturation of 98% on room air.  He was given sublingual nitroglycerin 0.4 mg x 1 and was given aspirin 325 mg to chew.  Within approximately 5 minutes his pain had lessened to 2/10.  His vital signs continued to show mild diastolic hypertension at 174/08 with the patient slightly tachycardic at 102.  His oxygen saturation continued to be 97% on room air.  His pain ultimately resolved completely at which time the case was discussed with Dr. Alen Blew and it was decided that the patient should be transported to the emergency room for further evaluation and management.  Medications: I have reviewed the patient's current medications.  Allergies:  Allergies  Allergen Reactions  . Pollen Extract Other (See Comments)    Sneezing and shortness of breath    Past Medical History:  Diagnosis Date  . Hypertension   . MI (myocardial infarction) ([place])    2007  Past Surgical History:  Procedure Laterality Date  . CYSTOSCOPY W/ URETERAL STENT PLACEMENT Bilateral 04/13/2018   Procedure: CYSTOSCOPY WITH BILATERAL STENT REPLACEMENT;  Surgeon: Irine Seal, MD;  Location: [facility];  Service: Urology;  Laterality: Bilateral;  . eye removed    . HERNIA REPAIR    . INTRAOCULAR PROSTHESES INSERTION    . IR IMAGING GUIDED PORT INSERTION  06/21/2018  . PROSTATE BIOPSY [name] 04/13/2018   Procedure: ULTRASOUND GUIDED PROSTATE BIOPSY;   Surgeon: Irine Seal, MD;  Location: [facility];  Service: Urology;  Laterality: [name];  . TOOTH EXTRACTION Right 04/21/2013   Procedure: EXTRACTION MOLARS;  Surgeon: [name], DDS;  Location: [place];  Service: Oral Surgery;  Laterality: Right;  . TRANSURETHRAL RESECTION OF PROSTATE  04/13/2018   Procedure: TRANSURETHRAL RESECTION OF THE PROSTATE (TURP);  Surgeon: Irine Seal, MD;  Location: [facility];  Service: Urology;;    Family History  Problem Relation Age of Onset  . Hypertension Father   . Depression Brother     Social History   Socioeconomic History  . Marital status: Married    Spouse name: Not on file  . Number of children: Not on file  . Years of education: Not on file  . Highest education level: Not on file  Occupational History  . Not on file  Social Needs  . Financial resource strain: Not on file  . Food insecurity:    Worry: Not on file    Inability: Not on file  . Transportation needs:    Medical: Yes    Non-medical: Yes  Tobacco Use  . Smoking status: Never Smoker  . Smokeless tobacco: Never Used  Substance and Sexual Activity  . Alcohol use: No  . Drug use: No  . Sexual activity: Not Currently  Lifestyle  . Physical activity:    Days per week: Not on file    Minutes per session: Not on file  . Stress: Not on file  Relationships  . Social connections:    Talks on phone: Not on file    Gets together: Not on file    Attends religious service: Not on file    Active member of club or organization: Not on file    Attends meetings of clubs or organizations: Not on file    Relationship status: Not on file  . Intimate partner violence:    Fear of current or ex partner: Not on file    Emotionally abused: Not on file    Physically abused: Not on file    Forced sexual activity: Not on file  Other Topics Concern  . Not on file  Social History Narrative  . Not on file    Past Medical History, Surgical history, Social history, and Family history were reviewed  and updated as appropriate.   Please see review of systems for further details on the patient's review from today.   Review of Systems:  Review of Systems  Constitutional: Negative for chills, diaphoresis and fever.  HENT: Negative for trouble swallowing and voice change.   Respiratory: Positive for shortness of breath. Negative for cough, chest tightness and wheezing.   Cardiovascular: Positive for chest pain (7/10 chest pressure). Negative for palpitations.  Gastrointestinal: Negative for abdominal pain, constipation, diarrhea, nausea and vomiting.  Musculoskeletal: Negative for back pain and myalgias.  Neurological: Negative for dizziness, light-headedness and headaches.    Objective:   Physical Exam:  There were no vitals taken for this visit. ECOG: 0  Physical Exam  Constitutional: No distress.  HENT:  Head: Normocephalic and atraumatic.  Cardiovascular: Regular rhythm and normal heart sounds. Tachycardia present. Exam reveals no gallop and no friction rub.  No murmur heard. And accessed right chest wall Port-A-Cath was noted.  Pulmonary/Chest: Effort normal and breath sounds normal. No respiratory distress. He has no wheezes. He has no rales.  Neurological: He is alert.  Skin: Skin is warm and dry. No rash noted. He is not diaphoretic. No erythema.    Lab Review:     Component Value Date/Time   NA 142 06/29/2018 1130   K 3.5 06/29/2018 1130   CL 107 06/29/2018 1130   CO2 24 06/29/2018 0758   GLUCOSE 145 (H) 06/29/2018 1130   BUN 7 06/29/2018 1130   CREATININE 1.20 06/29/2018 1130   CREATININE 1.21 06/29/2018 0758   CREATININE 1.07 06/01/2014 1257   CALCIUM 8.8 (L) 06/29/2018 0758   PROT 6.4 (L) 06/29/2018 0758   ALBUMIN 3.2 (L) 06/29/2018 0758   AST 25 06/29/2018 0758   ALT 40 06/29/2018 0758   ALKPHOS 64 06/29/2018 0758   BILITOT 0.3 06/29/2018 0758   GFRNONAA >60 06/29/2018 0758   GFRNONAA 82 06/01/2014 1257   GFRAA >60 06/29/2018 0758   GFRAA >89  06/01/2014 1257       Component Value Date/Time   WBC 8.3 06/29/2018 1124   RBC 2.49 (L) 06/29/2018 1124   HGB 7.8 (L) 06/29/2018 1130   HGB 8.4 (L) 06/29/2018 0758   HCT 23.0 (L) 06/29/2018 1130   PLT 393 06/29/2018 1124   PLT 418 (H) 06/29/2018 0758   MCV 105.2 (H) 06/29/2018 1124   MCV 94.5 12/26/2011 1614   MCH 31.7 06/29/2018 1124   MCHC 30.2 06/29/2018 1124   RDW 14.8 06/29/2018 1124   LYMPHSABS 0.8 06/29/2018 1124   MONOABS 0.4 06/29/2018 1124   EOSABS 0.0 06/29/2018 1124   BASOSABS 0.1 06/29/2018 1124   -------------------------------  Imaging from last 24 hours (if applicable):  Radiology interpretation: Dg Chest Port 1 View  Result Date: 06/29/2018 CLINICAL DATA:  Chest pain shortness of Breath EXAM: PORTABLE CHEST 1 VIEW COMPARISON:  05/22/2018 FINDINGS: Right-sided chest wall port is noted in satisfactory position. This is new from the prior CT examination. Cardiac shadow is within normal limits. The known pulmonary nodule seen on prior CT examination are not well appreciated on this exam. No focal infiltrate is seen. No pneumothorax is noted. IMPRESSION: New right chest wall port in satisfactory position. No other focal abnormality is noted. Electronically Signed   By: Inez Catalina M.D.   On: 06/29/2018 12:17   Ir Imaging Guided Port Insertion  Result Date: 06/21/2018 INDICATION: 52 year old with leiomyosarcoma from prostate and metastatic disease. Port-A-Cath needed for treatment. EXAM: FLUOROSCOPIC AND ULTRASOUND GUIDED PLACEMENT OF A SUBCUTANEOUS PORT COMPARISON:  None. MEDICATIONS: Ancef 2 g; The antibiotic was administered within an appropriate time interval prior to skin puncture. ANESTHESIA/SEDATION: Versed 2.0 mg IV; Fentanyl 100 mcg IV; Moderate Sedation Time:  36 minutes The patient was continuously monitored during the procedure by the interventional radiology nurse under my direct supervision. FLUOROSCOPY TIME:  18 seconds, 5 mGy COMPLICATIONS: None  immediate. PROCEDURE: The procedure, risks, benefits, and alternatives were explained to the patient. Questions regarding the procedure were encouraged and answered. The patient understands and consents to the procedure. Patient was placed supine on the interventional table. Ultrasound confirmed a patent right internal jugular vein. Ultrasound image was saved for documentation. The right chest and neck were cleaned with  a skin antiseptic and a sterile drape was placed. Maximal barrier sterile technique was utilized including caps, mask, sterile gowns, sterile gloves, sterile drape, hand hygiene and skin antiseptic. The right neck was anesthetized with 1% lidocaine. Small incision was made in the right neck with a blade. Micropuncture set was placed in the right internal jugular vein with ultrasound guidance. The micropuncture wire was used for measurement purposes. The right chest was anesthetized with 1% lidocaine with epinephrine. #15 blade was used to make an incision and a subcutaneous port pocket was formed. [place] was assembled. Subcutaneous tunnel was formed with a stiff tunneling device. The port catheter was brought through the subcutaneous tunnel. The port was placed in the subcutaneous pocket and sutured in place. The micropuncture set was exchanged for a peel-away sheath. The catheter was placed through the peel-away sheath and the tip was positioned SVC and right atrium junction. Catheter placement was confirmed with fluoroscopy. The port was accessed and flushed with heparinized saline. The port pocket was closed using two layers of absorbable sutures and Dermabond. The vein skin site was closed using a single layer of absorbable suture and Dermabond. Sterile dressings were applied. Patient tolerated the procedure well without an immediate complication. Ultrasound and fluoroscopic images were taken and saved for this procedure. IMPRESSION: Placement of a CT injectable port device. Catheter  tip at the SVC and right atrium junction. Electronically Signed   By: Markus Daft M.D.   On: 06/21/2018 14:24   [employer] [facility]  Result Date: 06/27/2018 CLINICAL DATA:  Prostate leiomyosarcoma. Bilateral nephroureteral stents. Request to evaluate bladder for Foley catheter position and for bladder clots. EXAM: ULTRASOUND RETROPERITONEAL LIMITED TECHNIQUE: Ultrasound examination of the abdominal aorta was performed to evaluate for abdominal aortic aneurysm. The common iliac arteries, IVC, and kidneys were also evaluated. COMPARISON:  05/22/2018 CT abdomen/pelvis. FINDINGS: Cursory views of kidneys bilaterally demonstrate mild bilateral hydronephrosis, which appears similar to slightly worsened from the recent CT. The bladder is decompressed with indwelling Foley catheter. No mass is seen within the bladder. Visualization of the bladder is limited by decompressed state and by mass effect from the large posterior pelvic mass. Redemonstrated is a large 15.2 x 10.0 x 8.9 cm mass posterior to the bladder with mixed cystic and solid internal echoes. IMPRESSION: 1. Bladder decompressed by indwelling Foley catheter. No mass/clot demonstrated within the bladder. 2. Redemonstration of large heterogeneous pelvic mass posterior to the bladder. 3. Mild bilateral hydronephrosis, similar to slightly worsened from 05/22/2018 CT. Electronically Signed   By: Ilona Sorrel M.D.   On: 06/27/2018 20:30        This case was discussed Dr. Alen Blew. He expressed agreement with my management of this patient.

## 2018-06-29 NOTE — ED Triage Notes (Signed)
Pt from cancer center w/ hx of sarcoma of prostate and MI in 2015. Pt complains of substernal chest pain prior to treatment at cancer center. Pt was given 0.3 sublingual nitrogen and aspirin. Pt states pain improved.

## 2018-06-29 NOTE — Telephone Encounter (Signed)
Pt sched per 10/24 sch message

## 2018-06-29 NOTE — ED Provider Notes (Signed)
[facility] DEPT Provider Note   CSN: [id number] Arrival date & time: 06/29/18  1101     History   Chief Complaint Chief Complaint  Patient presents with  . Chest Pain    HPI Dylan Gonzalez is a 52 y.o. male.  HPI   He developed chest pain this morning prior to chemotherapy.  He was treated with aspirin, and sublingual nitroglycerin with resolution of his pain.  He is being treated for prostate cancer with a pelvic mass.   Past Medical History:  Diagnosis Date  . Hypertension   . MI (myocardial infarction) [facility])    2007    Patient Active Problem List   Diagnosis Date Noted  . Port-A-Cath in place 06/29/2018  . Goals of care, counseling/discussion 05/15/2018  . Bilateral ureteral obstruction 04/19/2018  . 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 Prostate neoplasm 04/13/2018  . Hypertension 12/26/2011  . 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 CYSTOSCOPY W/ URETERAL STENT PLACEMENT Bilateral 04/13/2018   Procedure: CYSTOSCOPY WITH BILATERAL STENT REPLACEMENT;  Surgeon: Irine Seal, MD;  Location: [facility];  Service: Urology;  Laterality: Bilateral;  . eye removed    . HERNIA REPAIR    . INTRAOCULAR PROSTHESES INSERTION    . IR IMAGING GUIDED PORT INSERTION  06/21/2018  . PROSTATE BIOPSY [name] 04/13/2018   Procedure: ULTRASOUND GUIDED PROSTATE BIOPSY;  Surgeon: Irine Seal, MD;  Location: [facility];  Service: Urology;  Laterality: [name];  . TOOTH EXTRACTION Right 04/21/2013   Procedure: EXTRACTION MOLARS;  Surgeon: [name], DDS;  Location: [place];  Service: Oral Surgery;  Laterality: Right;  . TRANSURETHRAL RESECTION OF PROSTATE  04/13/2018   Procedure: TRANSURETHRAL RESECTION OF THE PROSTATE (TURP);  Surgeon: Irine Seal, MD;  Location: [facility];  Service: Urology;;        Home Medications     Prior to Admission medications   Medication Sig Start Date End Date Taking? Authorizing Provider  albuterol (PROVENTIL HFA) 108 (90 Base) MCG/ACT inhaler Inhale 1-2 puffs into the lungs every 6 (six) hours as needed for wheezing or shortness of breath.   Yes [provider]  ALPRAZolam Duanne Moron) 0.5 MG tablet Take 0.5 mg by mouth 2 (two) times daily as needed for anxiety.  05/31/18  Yes [provider]  amLODipine (NORVASC) 10 MG tablet Take 1 tablet (10 mg total) by mouth daily. 04/19/18  Yes Georgette Shell, MD  Docusate Calcium (STOOL SOFTENER PO) Take 1 tablet by mouth daily.   Yes [provider]  fluticasone (FLONASE) 50 MCG/ACT nasal spray PLACE 2 SPRAYS INTO BOTH NOSTRILS DAILY. Patient taking differently: Place 2 sprays into both nostrils daily.  07/22/14  Yes Le, Thao P, DO  hydrochlorothiazide (HYDRODIURIL) 25 MG tablet Take 25 mg by mouth daily.  05/02/18  Yes [provider]  HYDROcodone-acetaminophen (NORCO/VICODIN) 5-325 MG tablet Take 1 tablet by mouth every 6 (six) hours as needed for moderate pain. 05/15/18 05/15/19 Yes [name], Mathis Dad, MD  Hyoscyamine Sulfate SL (LEVSIN/SL) 0.125 MG SUBL Place 0.125 mg under the tongue every 8 (eight) hours as needed. Patient taking differently: Place 0.125 mg under the tongue every 8 (eight) hours as needed (cramping).  04/19/18  Yes Georgette Shell, MD  lidocaine-prilocaine (EMLA) cream Apply 1 application topically as needed. 06/22/18  Yes [name], MD  metoprolol succinate (TOPROL-XL) 50  MG 24 hr tablet Take 50 mg by mouth daily. 05/17/18  Yes [provider]  Multiple Vitamin (MULTIVITAMIN) tablet Take 2 tablets by mouth daily.    Yes [provider]  oxybutynin (DITROPAN) 5 MG tablet Take 5 mg by mouth 3 (three) times daily. 06/04/18  Yes [provider]  phenazopyridine (PYRIDIUM) 200 MG tablet Take 1 tablet (200 mg total) by mouth 3 (three) times daily as needed  (burning). 04/19/18  Yes Georgette Shell, MD  polyethylene glycol [facility] Floria Raveling) packet Take 17 g by mouth 2 (two) times daily as needed for mild constipation or moderate constipation. 04/23/18  Yes Pollina, Gwenyth Allegra, MD  prochlorperazine (COMPAZINE) 10 MG tablet Take 1 tablet (10 mg total) by mouth every 6 (six) hours as needed for nausea or vomiting. 05/15/18  Yes [name], MD  Rivaroxaban 15 & 20 MG TBPK Take as directed on package: Start with one 15mg  tablet by mouth twice a day with food. On Day 22, switch to one 20mg  tablet once a day with food. 06/23/18  Yes [name], MD  sulfamethoxazole-trimethoprim (BACTRIM DS,SEPTRA DS) 800-160 MG tablet Take 1 tablet by mouth 2 (two) times daily for 10 days. 06/27/18 07/07/18 Yes Joy, Shawn C, PA-C  belladonna-opium (B&O SUPPRETTES) 16.2-30 MG suppository Place 1 suppository (30 mg total) rectally every 8 (eight) hours as needed for pain. Patient not taking: Reported on 06/27/2018 04/23/18   Orpah Greek, MD  cefUROXime (CEFTIN) 250 MG tablet Take 1 tablet (250 mg total) by mouth 2 (two) times daily with a meal. Patient not taking: Reported on 06/29/2018 04/23/18   Orpah Greek, MD  hydrocortisone cream 1 % Apply to affected area 2 times daily Patient not taking: Reported on 06/27/2018 08/05/17   Langston Masker B, PA-C  hydrOXYzine (VISTARIL) 100 MG capsule TAKE ONE CAPSULE BY MOUTH AT BEDTIME AS NEEDED FOR ALLERGY Patient not taking: Reported on 06/29/2018 10/12/15   Robyn Haber, MD  metoprolol tartrate (LOPRESSOR) 50 MG tablet Take 1 tablet (50 mg total) by mouth 2 (two) times daily. 04/19/18   Georgette Shell, MD  oxyCODONE (OXY IR/ROXICODONE) 5 MG immediate release tablet Take 1-2 tablets (5-10 mg total) by mouth every 4 (four) hours as needed for severe pain. 06/29/18   [name], MD    Family History Family History  Problem Relation Age of Onset  . Hypertension Father   . Depression  Brother     Social History Social History   Tobacco Use  . Smoking status: Never Smoker  . Smokeless tobacco: Never Used  Substance Use Topics  . Alcohol use: No  . Drug use: No     Allergies   Pollen extract   Review of Systems Review of Systems  All other systems reviewed and are negative.    Physical Exam Updated Vital Signs BP (!) 130/91   Pulse 89   Temp 97.6 F (36.4 C) (Oral)   Resp 16   SpO2 99%   Physical Exam  Constitutional: He is oriented to person, place, and time. He appears well-developed and well-nourished. No distress.  HENT:  Head: Normocephalic and atraumatic.  Right Ear: External ear normal.  Left Ear: External ear normal.  Eyes: Pupils are equal, round, and reactive to light. Conjunctivae and EOM are normal.  Neck: Normal range of motion and phonation normal. Neck supple.  Cardiovascular: Normal rate, regular rhythm and normal heart sounds.  Pulmonary/Chest: Effort normal and breath sounds normal.  He exhibits no bony tenderness.  Abdominal: Soft. There is no tenderness.  Musculoskeletal: Normal range of motion.  Neurological: He is alert and oriented to person, place, and time. No cranial nerve deficit or sensory deficit. He exhibits normal muscle tone. Coordination normal.  Skin: Skin is warm, dry and intact.  Psychiatric: He has a normal mood and affect. His behavior is normal. Judgment and thought content normal.  Nursing note and vitals reviewed.    ED Treatments / Results  Labs (all labs ordered are listed, but only abnormal results are displayed) Labs Reviewed  CBC WITH DIFFERENTIAL/PLATELET - Abnormal; Notable for the following components:      Result Value   RBC 2.49 (*)    Hemoglobin 7.9 (*)    HCT 26.2 (*)    MCV 105.2 (*)    All other components within normal limits  I-STAT CHEM 8, ED - Abnormal; Notable for the following components:   Glucose, Bld 145 (*)    Hemoglobin 7.8 (*)    HCT 23.0 (*)    All other components  within normal limits  I-STAT TROPONIN, ED    EKG EKG Interpretation  Date/Time:  Thursday June 29 2018 11:22:39 EDT Ventricular Rate:  87 PR Interval:    QRS Duration: 81 QT Interval:  364 QTC Calculation: 438 R Axis:   51 Text Interpretation:  Sinus rhythm Abnormal R-wave progression, early transition since last tracing no significant change Confirmed by Daleen Bo [phone]) on 06/29/2018 11:30:17 AM   Radiology Dg Chest Port 1 View  Result Date: 06/29/2018 CLINICAL DATA:  Chest pain shortness of Breath EXAM: PORTABLE CHEST 1 VIEW COMPARISON:  05/22/2018 FINDINGS: Right-sided chest wall port is noted in satisfactory position. This is new from the prior CT examination. Cardiac shadow is within normal limits. The known pulmonary nodule seen on prior CT examination are not well appreciated on this exam. No focal infiltrate is seen. No pneumothorax is noted. IMPRESSION: New right chest wall port in satisfactory position. No other focal abnormality is noted. Electronically Signed   By: Inez Catalina M.D.   On: 06/29/2018 12:17   [facility]  Result Date: 06/27/2018 CLINICAL DATA:  Prostate leiomyosarcoma. Bilateral nephroureteral stents. Request to evaluate bladder for Foley catheter position and for bladder clots. EXAM: ULTRASOUND RETROPERITONEAL LIMITED TECHNIQUE: Ultrasound examination of the abdominal aorta was performed to evaluate for abdominal aortic aneurysm. The common iliac arteries, IVC, and kidneys were also evaluated. COMPARISON:  05/22/2018 CT abdomen/pelvis. FINDINGS: Cursory views of kidneys bilaterally demonstrate mild bilateral hydronephrosis, which appears similar to slightly worsened from the recent CT. The bladder is decompressed with indwelling Foley catheter. No mass is seen within the bladder. Visualization of the bladder is limited by decompressed state and by mass effect from the large posterior pelvic mass. Redemonstrated is a large 15.2 x 10.0 x 8.9 cm  mass posterior to the bladder with mixed cystic and solid internal echoes. IMPRESSION: 1. Bladder decompressed by indwelling Foley catheter. No mass/clot demonstrated within the bladder. 2. Redemonstration of large heterogeneous pelvic mass posterior to the bladder. 3. Mild bilateral hydronephrosis, similar to slightly worsened from 05/22/2018 CT. Electronically Signed   By: Ilona Sorrel M.D.   On: 06/27/2018 20:30    Procedures Procedures (including critical care time)  Medications Ordered in ED Medications - No data to display   Initial Impression / Assessment and Plan / ED Course  I have reviewed the triage vital signs and the nursing notes.  Pertinent labs & imaging  results that were available during my care of the patient were reviewed by me and considered in my medical decision making (see chart for details).  Clinical Course as of Jun 29 1432  Thu Jun 29, 2018  1428 Normal except glucose high, hemoglobin low  I-stat Chem 8, ED(!) [EW]  1428 Normal  I-stat troponin, ED [EW]  1428 Normal except hemoglobin low  CBC with Differential(!) [EW]  1428 No acute abnormality, images reviewed by me  DG Chest Port 1 View [EW]  1429 CBC with Differential(!) [EW]    Clinical Course User Index [EW] Daleen Bo, MD     Patient Vitals for the past 24 hrs:  BP Temp Temp src Pulse Resp SpO2  06/29/18 1300 (!) 130/91 - - 89 16 99 %  06/29/18 1230 118/84 - - 88 (!) 22 95 %  06/29/18 1200 116/84 - - 83 (!) 22 98 %  06/29/18 1130 (!) 127/95 - - 92 - 99 %  06/29/18 1101 114/78 97.6 F (36.4 C) Oral 90 18 100 %    2:33 PM Reevaluation with update and discussion. After initial assessment and treatment, an updated evaluation reveals he remains comfortable.  He has been able to ambulate without significant weakness.  Findings discussed with the patient all questions were answered. Daleen Bo   Medical Decision Making: Nonspecific chest pain, atypical for coronary disease with reassuring  evaluation.  Patient reports history of MI but he does not have ongoing management by cardiology.  This was a remote event, approximately 10 years ago.  Doubt ACS, PE or pneumonia.  Patient stable for discharge and outpatient management.  Ongoing hematuria, currently being treated with Xarelto for DVT.  Hemoglobin low but stable.  Patient hemodynamically stable.  CRITICAL CARE-no Performed by: Daleen Bo  Nursing Notes Reviewed/ Care Coordinated Applicable Imaging Reviewed Interpretation of Laboratory Data incorporated into ED treatment  The patient appears reasonably screened and/or stabilized for discharge and I doubt any other medical condition or other [facility] requiring further screening, evaluation, or treatment in the ED at this time prior to discharge.  Plan: Home Medications- usual; Home Treatments- rest, fluids; return here if the recommended treatment, does not improve the symptoms; Recommended follow up- PCP, Onc as scheduled. Cardiology 1 week for risk evaluation   Final Clinical Impressions(s) / ED Diagnoses   Final diagnoses:  Nonspecific chest pain  Anemia, unspecified type  Hematuria, unspecified type    ED Discharge Orders    None       Daleen Bo, MD 06/30/18 1026

## 2018-06-29 NOTE — Discharge Instructions (Addendum)
The testing today did not show any significant problems with your heart.  Follow-up with your oncologist as planned for further infusions.  Call the on-call cardiologist for an appointment to be seen for further testing for possible heart disease.  Return here, if needed, for problems.

## 2018-06-30 NOTE — Progress Notes (Signed)
FMLA successfully faxed to [employer] at [phone]. Mailed copy to patient address on file.

## 2018-07-01 ENCOUNTER — Inpatient Hospital Stay: Payer: BLUE CROSS/BLUE SHIELD

## 2018-07-03 ENCOUNTER — Other Ambulatory Visit: Payer: Self-pay | Admitting: Oncology

## 2018-07-03 ENCOUNTER — Telehealth: Payer: Self-pay | Admitting: Oncology

## 2018-07-03 ENCOUNTER — Inpatient Hospital Stay: Payer: BLUE CROSS/BLUE SHIELD

## 2018-07-03 VITALS — BP 126/85 | HR 78 | Temp 98.0°F | Resp 18

## 2018-07-03 DIAGNOSIS — C494 Malignant neoplasm of connective and soft tissue of abdomen: Secondary | ICD-10-CM

## 2018-07-03 DIAGNOSIS — C7800 Secondary malignant neoplasm of unspecified lung: Secondary | ICD-10-CM

## 2018-07-03 DIAGNOSIS — Z95828 Presence of other vascular implants and grafts: Secondary | ICD-10-CM

## 2018-07-03 LAB — CBC WITH DIFFERENTIAL (CANCER CENTER ONLY)
Abs Immature Granulocytes: 0.03 10*3/uL (ref 0.00–0.07)
BASOS ABS: 0 10*3/uL (ref 0.0–0.1)
BASOS PCT: 0 %
EOS ABS: 0.3 10*3/uL (ref 0.0–0.5)
EOS PCT: 3 %
HCT: 27.9 % — ABNORMAL LOW (ref 39.0–52.0)
Hemoglobin: 8.6 g/dL — ABNORMAL LOW (ref 13.0–17.0)
Immature Granulocytes: 0 %
LYMPHS PCT: 17 %
Lymphs Abs: 1.4 10*3/uL (ref 0.7–4.0)
MCH: 31.7 pg (ref 26.0–34.0)
MCHC: 30.8 g/dL (ref 30.0–36.0)
MCV: 103 fL — ABNORMAL HIGH (ref 80.0–100.0)
Monocytes Absolute: 0.9 10*3/uL (ref 0.1–1.0)
Monocytes Relative: 11 %
Neutro Abs: 5.8 10*3/uL (ref 1.7–7.7)
Neutrophils Relative %: 69 %
PLATELETS: 289 10*3/uL (ref 150–400)
RBC: 2.71 MIL/uL — AB (ref 4.22–5.81)
RDW: 16 % — ABNORMAL HIGH (ref 11.5–15.5)
WBC: 8.4 10*3/uL (ref 4.0–10.5)
nRBC: 0.2 % (ref 0.0–0.2)

## 2018-07-03 LAB — CMP (CANCER CENTER ONLY)
ALT: 29 U/L (ref 0–44)
AST: 19 U/L (ref 15–41)
Albumin: 3.5 g/dL (ref 3.5–5.0)
Alkaline Phosphatase: 55 U/L (ref 38–126)
Anion gap: 9 (ref 5–15)
BUN: 14 mg/dL (ref 6–20)
CALCIUM: 9.2 mg/dL (ref 8.9–10.3)
CHLORIDE: 107 mmol/L (ref 98–111)
CO2: 27 mmol/L (ref 22–32)
CREATININE: 1.17 mg/dL (ref 0.61–1.24)
Glucose, Bld: 102 mg/dL — ABNORMAL HIGH (ref 70–99)
Potassium: 3.9 mmol/L (ref 3.5–5.1)
Sodium: 143 mmol/L (ref 135–145)
TOTAL PROTEIN: 6.6 g/dL (ref 6.5–8.1)
Total Bilirubin: 0.4 mg/dL (ref 0.3–1.2)

## 2018-07-03 MED ORDER — SODIUM CHLORIDE 0.9 % IV SOLN
Freq: Once | INTRAVENOUS | Status: AC
  Administered 2018-07-03: 11:00:00 via INTRAVENOUS
  Filled 2018-07-03: qty 250

## 2018-07-03 MED ORDER — HEPARIN SOD (PORK) LOCK FLUSH 100 UNIT/ML IV SOLN
500.0000 [IU] | Freq: Once | INTRAVENOUS | Status: AC
  Administered 2018-07-03: 500 [IU]
  Filled 2018-07-03: qty 5

## 2018-07-03 MED ORDER — SODIUM CHLORIDE 0.9% FLUSH
10.0000 mL | Freq: Once | INTRAVENOUS | Status: AC
  Administered 2018-07-03: 10 mL
  Filled 2018-07-03: qty 10

## 2018-07-03 MED ORDER — SODIUM CHLORIDE 0.9 % IV SOLN
100.0000 mg/m2 | Freq: Once | INTRAVENOUS | Status: AC
  Administered 2018-07-03: 220 mg via INTRAVENOUS
  Filled 2018-07-03: qty 22

## 2018-07-03 MED ORDER — SODIUM CHLORIDE 0.9 % IV SOLN
2000.0000 mg | Freq: Once | INTRAVENOUS | Status: AC
  Administered 2018-07-03: 2000 mg via INTRAVENOUS
  Filled 2018-07-03: qty 52.6

## 2018-07-03 MED ORDER — DEXAMETHASONE SODIUM PHOSPHATE 10 MG/ML IJ SOLN
INTRAMUSCULAR | Status: AC
  Filled 2018-07-03: qty 1

## 2018-07-03 MED ORDER — DEXAMETHASONE SODIUM PHOSPHATE 10 MG/ML IJ SOLN
10.0000 mg | Freq: Once | INTRAMUSCULAR | Status: AC
  Administered 2018-07-03: 10 mg via INTRAVENOUS

## 2018-07-03 NOTE — Telephone Encounter (Signed)
Scheduled apt per 10/28 sch message - pt is aware of appt added.

## 2018-07-03 NOTE — Patient Instructions (Signed)
[facility] Discharge Instructions for Patients Receiving Chemotherapy  Today you received the following chemotherapy agents :  Gemcitabine, Taxotere.  To help prevent nausea and vomiting after your treatment, we encourage you to take your nausea medication as prescribed.   If you develop nausea and vomiting that is not controlled by your nausea medication, call the clinic.   BELOW ARE SYMPTOMS THAT SHOULD BE REPORTED IMMEDIATELY:  *FEVER GREATER THAN 100.5 F  *CHILLS WITH OR WITHOUT FEVER  NAUSEA AND VOMITING THAT IS NOT CONTROLLED WITH YOUR NAUSEA MEDICATION  *UNUSUAL SHORTNESS OF BREATH  *UNUSUAL BRUISING OR BLEEDING  TENDERNESS IN MOUTH AND THROAT WITH OR WITHOUT PRESENCE OF ULCERS  *URINARY PROBLEMS  *BOWEL PROBLEMS  UNUSUAL RASH Items with * indicate a potential emergency and should be followed up as soon as possible.  Feel free to call the clinic should you have any questions or concerns. The clinic phone number is [phone].  Please show the [place] at check-in to the Emergency Department and triage nurse.

## 2018-07-05 ENCOUNTER — Inpatient Hospital Stay: Payer: BLUE CROSS/BLUE SHIELD

## 2018-07-05 ENCOUNTER — Telehealth: Payer: Self-pay

## 2018-07-05 DIAGNOSIS — C7800 Secondary malignant neoplasm of unspecified lung: Secondary | ICD-10-CM | POA: Diagnosis not present

## 2018-07-05 DIAGNOSIS — R339 Retention of urine, unspecified: Secondary | ICD-10-CM | POA: Diagnosis not present

## 2018-07-05 DIAGNOSIS — D4959 Neoplasm of unspecified behavior of other genitourinary organ: Secondary | ICD-10-CM | POA: Diagnosis not present

## 2018-07-05 DIAGNOSIS — C494 Malignant neoplasm of connective and soft tissue of abdomen: Secondary | ICD-10-CM

## 2018-07-05 DIAGNOSIS — C61 Malignant neoplasm of prostate: Secondary | ICD-10-CM | POA: Diagnosis not present

## 2018-07-05 MED ORDER — PEGFILGRASTIM-CBQV 6 MG/0.6ML ~~LOC~~ SOSY
PREFILLED_SYRINGE | SUBCUTANEOUS | Status: AC
  Filled 2018-07-05: qty 0.6

## 2018-07-05 MED ORDER — PEGFILGRASTIM-CBQV 6 MG/0.6ML ~~LOC~~ SOSY
6.0000 mg | PREFILLED_SYRINGE | Freq: Once | SUBCUTANEOUS | Status: AC
  Administered 2018-07-05: 6 mg via SUBCUTANEOUS

## 2018-07-05 NOTE — Telephone Encounter (Signed)
Received a call from patient requesting a bed from [facility]. This [place] contacted Santiago Glad with [facility], appropriate documentation was filled out, signed by MD, and returned to [facility] rep. Left VM for patient with the above information and that he should be contacted by [facility] fore delivery of the bed.

## 2018-07-05 NOTE — Patient Instructions (Signed)
Pegfilgrastim injection What is this medicine? PEGFILGRASTIM (PEG fil gra stim) is a long-acting granulocyte colony-stimulating factor that stimulates the growth of neutrophils, a type of white blood cell important in the body's fight against infection. It is used to reduce the incidence of fever and infection in patients with certain types of cancer who are receiving chemotherapy that affects the bone marrow, and to increase survival after being exposed to high doses of radiation. This medicine may be used for other purposes; ask your health care provider or pharmacist if you have questions. COMMON BRAND NAME(S): Neulasta What should I tell my health care provider before I take this medicine? They need to know if you have any of these conditions: -kidney disease -latex allergy -ongoing radiation therapy -sickle cell disease -skin reactions to acrylic adhesives (On-Body Injector only) -an unusual or allergic reaction to pegfilgrastim, filgrastim, other medicines, foods, dyes, or preservatives -pregnant or trying to get pregnant -breast-feeding How should I use this medicine? This medicine is for injection under the skin. If you get this medicine at home, you will be taught how to prepare and give the pre-filled syringe or how to use the On-body Injector. Refer to the patient Instructions for Use for detailed instructions. Use exactly as directed. Tell your healthcare provider immediately if you suspect that the On-body Injector may not have performed as intended or if you suspect the use of the On-body Injector resulted in a missed or partial dose. It is important that you put your used needles and syringes in a special sharps container. Do not put them in a trash can. If you do not have a sharps container, call your pharmacist or healthcare provider to get one. Talk to your pediatrician regarding the use of this medicine in children. While this drug may be prescribed for selected conditions,  precautions do apply. Overdosage: If you think you have taken too much of this medicine contact a poison control center or emergency room at once. NOTE: This medicine is only for you. Do not share this medicine with others. What if I miss a dose? It is important not to miss your dose. Call your doctor or health care professional if you miss your dose. If you miss a dose due to an On-body Injector failure or leakage, a new dose should be administered as soon as possible using a single prefilled syringe for manual use. What may interact with this medicine? Interactions have not been studied. Give your health care provider a list of all the medicines, herbs, non-prescription drugs, or dietary supplements you use. Also tell them if you smoke, drink alcohol, or use illegal drugs. Some items may interact with your medicine. This list may not describe all possible interactions. Give your health care provider a list of all the medicines, herbs, non-prescription drugs, or dietary supplements you use. Also tell them if you smoke, drink alcohol, or use illegal drugs. Some items may interact with your medicine. What should I watch for while using this medicine? You may need blood work done while you are taking this medicine. If you are going to need a MRI, CT scan, or other procedure, tell your doctor that you are using this medicine (On-Body Injector only). What side effects may I notice from receiving this medicine? Side effects that you should report to your doctor or health care professional as soon as possible: -allergic reactions like skin rash, itching or hives, swelling of the face, lips, or tongue -dizziness -fever -pain, redness, or irritation at site   where injected -pinpoint red spots on the skin -red or dark-brown urine -shortness of breath or breathing problems -stomach or side pain, or pain at the shoulder -swelling -tiredness -trouble passing urine or change in the amount of urine Side  effects that usually do not require medical attention (report to your doctor or health care professional if they continue or are bothersome): -bone pain -muscle pain This list may not describe all possible side effects. Call your doctor for medical advice about side effects. You may report side effects to FDA at 1-800-FDA-1088. Where should I keep my medicine? Keep out of the reach of children. Store pre-filled syringes in a refrigerator between 2 and 8 degrees C (36 and 46 degrees F). Do not freeze. Keep in carton to protect from light. Throw away this medicine if it is left out of the refrigerator for more than 48 hours. Throw away any unused medicine after the expiration date. NOTE: This sheet is a summary. It may not cover all possible information. If you have questions about this medicine, talk to your doctor, pharmacist, or health care provider.  2018 Elsevier/Gold Standard (2016-08-19 12:58:03)  

## 2018-07-07 DIAGNOSIS — I5032 Chronic diastolic (congestive) heart failure: Secondary | ICD-10-CM

## 2018-07-07 HISTORY — DX: Chronic diastolic (congestive) heart failure: I50.32

## 2018-07-10 ENCOUNTER — Other Ambulatory Visit: Payer: BLUE CROSS/BLUE SHIELD

## 2018-07-10 ENCOUNTER — Ambulatory Visit: Payer: BLUE CROSS/BLUE SHIELD

## 2018-07-10 ENCOUNTER — Ambulatory Visit: Payer: BLUE CROSS/BLUE SHIELD | Admitting: Oncology

## 2018-07-13 ENCOUNTER — Other Ambulatory Visit: Payer: Self-pay | Admitting: *Deleted

## 2018-07-13 ENCOUNTER — Telehealth: Payer: Self-pay | Admitting: *Deleted

## 2018-07-13 MED ORDER — OXYCODONE HCL 5 MG PO TABS: 5.0000 mg | ORAL_TABLET | ORAL | 0 refills | Status: DC | PRN

## 2018-07-13 NOTE — Telephone Encounter (Signed)
Ok to refill 

## 2018-07-13 NOTE — Telephone Encounter (Signed)
Patient calling to say he would like a refill on his oxycodone 5 mg tablets. C/o increased pain in his groin area.  States he is taking 2 tablets every 4 hours and is running out. Would like more that 60 tablets.

## 2018-07-17 ENCOUNTER — Inpatient Hospital Stay: Payer: BLUE CROSS/BLUE SHIELD

## 2018-07-17 ENCOUNTER — Inpatient Hospital Stay: Payer: BLUE CROSS/BLUE SHIELD | Attending: Oncology

## 2018-07-17 ENCOUNTER — Inpatient Hospital Stay (HOSPITAL_BASED_OUTPATIENT_CLINIC_OR_DEPARTMENT_OTHER): Payer: BLUE CROSS/BLUE SHIELD | Admitting: Oncology

## 2018-07-17 ENCOUNTER — Telehealth: Payer: Self-pay | Admitting: Oncology

## 2018-07-17 DIAGNOSIS — C494 Malignant neoplasm of connective and soft tissue of abdomen: Secondary | ICD-10-CM

## 2018-07-17 DIAGNOSIS — G629 Polyneuropathy, unspecified: Secondary | ICD-10-CM

## 2018-07-17 DIAGNOSIS — Z5189 Encounter for other specified aftercare: Secondary | ICD-10-CM | POA: Insufficient documentation

## 2018-07-17 DIAGNOSIS — Z79899 Other long term (current) drug therapy: Secondary | ICD-10-CM | POA: Insufficient documentation

## 2018-07-17 DIAGNOSIS — Z86718 Personal history of other venous thrombosis and embolism: Secondary | ICD-10-CM | POA: Insufficient documentation

## 2018-07-17 DIAGNOSIS — Z7901 Long term (current) use of anticoagulants: Secondary | ICD-10-CM

## 2018-07-17 DIAGNOSIS — C419 Malignant neoplasm of bone and articular cartilage, unspecified: Secondary | ICD-10-CM

## 2018-07-17 DIAGNOSIS — Z95828 Presence of other vascular implants and grafts: Secondary | ICD-10-CM

## 2018-07-17 DIAGNOSIS — C7800 Secondary malignant neoplasm of unspecified lung: Secondary | ICD-10-CM

## 2018-07-17 LAB — CBC WITH DIFFERENTIAL (CANCER CENTER ONLY)
Abs Immature Granulocytes: 0.4 10*3/uL — ABNORMAL HIGH (ref 0.00–0.07)
BASOS ABS: 0.1 10*3/uL (ref 0.0–0.1)
Basophils Relative: 0 %
EOS PCT: 0 %
Eosinophils Absolute: 0 10*3/uL (ref 0.0–0.5)
HEMATOCRIT: 26.6 % — AB (ref 39.0–52.0)
HEMOGLOBIN: 8.3 g/dL — AB (ref 13.0–17.0)
Immature Granulocytes: 3 %
LYMPHS ABS: 2.1 10*3/uL (ref 0.7–4.0)
LYMPHS PCT: 15 %
MCH: 33.1 pg (ref 26.0–34.0)
MCHC: 31.2 g/dL (ref 30.0–36.0)
MCV: 106 fL — ABNORMAL HIGH (ref 80.0–100.0)
MONO ABS: 1.2 10*3/uL — AB (ref 0.1–1.0)
Monocytes Relative: 9 %
NRBC: 10.4 % — AB (ref 0.0–0.2)
Neutro Abs: 10.4 10*3/uL — ABNORMAL HIGH (ref 1.7–7.7)
Neutrophils Relative %: 73 %
Platelet Count: 176 10*3/uL (ref 150–400)
RBC: 2.51 MIL/uL — AB (ref 4.22–5.81)
RDW: 17.9 % — AB (ref 11.5–15.5)
WBC: 14.2 10*3/uL — AB (ref 4.0–10.5)

## 2018-07-17 LAB — CMP (CANCER CENTER ONLY)
ALK PHOS: 81 U/L (ref 38–126)
ALT: 49 U/L — AB (ref 0–44)
AST: 38 U/L (ref 15–41)
Albumin: 3.3 g/dL — ABNORMAL LOW (ref 3.5–5.0)
Anion gap: 10 (ref 5–15)
BUN: 16 mg/dL (ref 6–20)
CALCIUM: 9 mg/dL (ref 8.9–10.3)
CHLORIDE: 105 mmol/L (ref 98–111)
CO2: 28 mmol/L (ref 22–32)
CREATININE: 1.18 mg/dL (ref 0.61–1.24)
Glucose, Bld: 131 mg/dL — ABNORMAL HIGH (ref 70–99)
Potassium: 3.5 mmol/L (ref 3.5–5.1)
Sodium: 143 mmol/L (ref 135–145)
Total Bilirubin: 0.3 mg/dL (ref 0.3–1.2)
Total Protein: 6.2 g/dL — ABNORMAL LOW (ref 6.5–8.1)

## 2018-07-17 MED ORDER — RIVAROXABAN 20 MG PO TABS: 20.0000 mg | ORAL_TABLET | Freq: Every day | ORAL | 3 refills | Status: DC

## 2018-07-17 MED ORDER — PROCHLORPERAZINE MALEATE 10 MG PO TABS
ORAL_TABLET | ORAL | Status: AC
  Filled 2018-07-17: qty 1

## 2018-07-17 MED ORDER — SODIUM CHLORIDE 0.9 % IV SOLN
Freq: Once | INTRAVENOUS | Status: AC
  Administered 2018-07-17: 10:00:00 via INTRAVENOUS
  Filled 2018-07-17: qty 250

## 2018-07-17 MED ORDER — SODIUM CHLORIDE 0.9 % IV SOLN
2000.0000 mg | Freq: Once | INTRAVENOUS | Status: AC
  Administered 2018-07-17: 2000 mg via INTRAVENOUS
  Filled 2018-07-17: qty 52.6

## 2018-07-17 MED ORDER — SODIUM CHLORIDE 0.9% FLUSH
10.0000 mL | INTRAVENOUS | Status: DC | PRN
  Administered 2018-07-17: 10 mL
  Filled 2018-07-17: qty 10

## 2018-07-17 MED ORDER — SODIUM CHLORIDE 0.9% FLUSH
10.0000 mL | Freq: Once | INTRAVENOUS | Status: AC
  Administered 2018-07-17: 10 mL
  Filled 2018-07-17: qty 10

## 2018-07-17 MED ORDER — HEPARIN SOD (PORK) LOCK FLUSH 100 UNIT/ML IV SOLN
500.0000 [IU] | Freq: Once | INTRAVENOUS | Status: AC | PRN
  Administered 2018-07-17: 500 [IU]
  Filled 2018-07-17: qty 5

## 2018-07-17 MED ORDER — PROCHLORPERAZINE MALEATE 10 MG PO TABS
10.0000 mg | ORAL_TABLET | Freq: Once | ORAL | Status: AC
  Administered 2018-07-17: 10 mg via ORAL

## 2018-07-17 MED FILL — XARELTO 20 MG TABLET: 20 | 30 days supply | Qty: 30 | Fill #0

## 2018-07-17 NOTE — Telephone Encounter (Signed)
Scheduled appt per 11/11 los- gave patient AVS and calender per los.

## 2018-07-17 NOTE — Progress Notes (Signed)
Hematology and Oncology Follow Up Visit  Dylan Gonzalez [id number] April 14, 1966 52 y.o. 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  He has locally advanced disease and potentially pulmonary metastasis.  Prior Therapy: He is S/P transrectal prostate ultrasound and a prostate biopsy as well as a cystoscopy with insertion of double-J stent and transurethral resection of the prostate completed by Dr. Jeffie Pollock on April 13, 2018.  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  Immunohistochemical stains showed positive for desmin, smooth muscle actin, muscle specific actin and negative for PSA.  He was also negative for cytokeratin 7, 903, CD117 and CD34.     Current therapy: Taxotere with gemcitabine started on 05/30/2018.  He is status post 2 cycles of therapy he is here for day 1 cycle 3.  Interim History: Dylan Gonzalez returns today for a repeat evaluation.  Since last visit, he reports feeling reasonably well without any major complaints.  He has tolerated chemotherapy without any major complications.  He has faint grade 1 neuropathy that has been intermittent in nature.  He denies any excessive nausea, fatigue or vomiting.  He does report intermittent hematuria and has a Foley catheter in place.  He is ambulating without any difficulties with the help of a cane.  He denies any falls or syncope.  His performance status is improving and his pain level is also much improved.  He does not report any headaches, blurry vision, syncope or seizures.  He denies any alteration in mental status or confusion.  Does not report any fevers, chills or sweats.  Does not report any cough, wheezing or hemoptysis.  Does not report any chest pain, palpitation, orthopnea or leg edema.  Does not report any nausea,  vomiting or abdominal pain.  Denies any change in his bowel habits.  Denies any arthralgias or myalgias.   Does not report frequency, urgency or hematuria.  Does not report any ecchymosis or petechiae.  No heat or cold intolerance.  Does not report changes in his mood.  Remaining review of systems is negative.    Medications: I have reviewed the patient's current medications.  Current Outpatient Medications  Medication Sig Dispense Refill  . albuterol (PROVENTIL HFA) 108 (90 Base) MCG/ACT inhaler Inhale 1-2 puffs into the lungs every 6 (six) hours as needed for wheezing or shortness of breath.    . ALPRAZolam (XANAX) 0.5 MG tablet Take 0.5 mg by mouth 2 (two) times daily as needed for anxiety.   0  . amLODipine (NORVASC) 10 MG tablet Take 1 tablet (10 mg total) by mouth daily. 30 tablet 1  . belladonna-opium (B&O SUPPRETTES) 16.2-30 MG suppository Place 1 suppository (30 mg total) rectally every 8 (eight) hours as needed for pain. (Patient not taking: Reported on 06/27/2018) 20 suppository 0  . cefUROXime (CEFTIN) 250 MG tablet Take 1 tablet (250 mg total) by mouth 2 (two) times daily with a meal. (Patient not taking: Reported on 06/29/2018) 20 tablet 0  . Docusate Calcium (STOOL SOFTENER PO) Take 1 tablet by mouth daily.    . fluticasone (FLONASE) 50 MCG/ACT nasal spray PLACE 2 SPRAYS INTO BOTH NOSTRILS DAILY. (Patient taking differently: Place 2 sprays into both nostrils daily. ) 16 g 9  . hydrochlorothiazide (HYDRODIURIL) 25 MG tablet Take 25 mg by mouth daily.   0  .  HYDROcodone-acetaminophen (NORCO/VICODIN) 5-325 MG tablet Take 1 tablet by mouth every 6 (six) hours as needed for moderate pain. 60 tablet 0  . hydrocortisone cream 1 % Apply to affected area 2 times daily (Patient not taking: Reported on 06/27/2018) 15 g 0  . hydrOXYzine (VISTARIL) 100 MG capsule TAKE ONE CAPSULE BY MOUTH AT BEDTIME AS NEEDED FOR ALLERGY (Patient not taking: Reported on 06/29/2018) 30 capsule 5  . Hyoscyamine  Sulfate SL (LEVSIN/SL) 0.125 MG SUBL Place 0.125 mg under the tongue every 8 (eight) hours as needed. (Patient taking differently: Place 0.125 mg under the tongue every 8 (eight) hours as needed (cramping). ) 120 each 0  . lidocaine-prilocaine (EMLA) cream Apply 1 application topically as needed. 30 g 0  . metoprolol succinate (TOPROL-XL) 50 MG 24 hr tablet Take 50 mg by mouth daily.  1  . metoprolol tartrate (LOPRESSOR) 50 MG tablet Take 1 tablet (50 mg total) by mouth 2 (two) times daily. 60 tablet 1  . Multiple Vitamin (MULTIVITAMIN) tablet Take 2 tablets by mouth daily.     Marland Kitchen oxybutynin (DITROPAN) 5 MG tablet Take 5 mg by mouth 3 (three) times daily.  3  . oxyCODONE (OXY IR/ROXICODONE) 5 MG immediate release tablet Take 1-2 tablets (5-10 mg total) by mouth every 4 (four) hours as needed for severe pain. 90 tablet 0  . phenazopyridine (PYRIDIUM) 200 MG tablet Take 1 tablet (200 mg total) by mouth 3 (three) times daily as needed (burning). 30 tablet 0  . polyethylene glycol (MIRALAX / GLYCOLAX) packet Take 17 g by mouth 2 (two) times daily as needed for mild constipation or moderate constipation. 14 each 0  . prochlorperazine (COMPAZINE) 10 MG tablet Take 1 tablet (10 mg total) by mouth every 6 (six) hours as needed for nausea or vomiting. 30 tablet 0  . Rivaroxaban 15 & 20 MG TBPK Take as directed on package: Start with one 73m tablet by mouth twice a day with food. On Day 22, switch to one 280mtablet once a day with food. 51 each 0   No current facility-administered medications for this visit.      Allergies:  Allergies  Allergen Reactions  . Pollen Extract Other (See Comments)    Sneezing and shortness of breath    Past Medical History, Surgical history, Social history, and Family History were reviewed and updated.    Physical Exam:  There were no vitals taken for this visit.    ECOG: 1    General appearance: Comfortable appearing without any discomfort Head:  Normocephalic without any trauma Oropharynx: Mucous membranes are moist and pink without any thrush or ulcers. Eyes: Pupils are equal and round reactive to light. Lymph nodes: No cervical, supraclavicular, inguinal or axillary lymphadenopathy.   Heart:regular rate and rhythm.  S1 and S2 without leg edema. Lung: Clear without any rhonchi or wheezes.  No dullness to percussion. Abdomin: Soft, nontender, nondistended with good bowel sounds.  No hepatosplenomegaly. Musculoskeletal: No joint deformity or effusion.  Full range of motion noted. Neurological: No deficits noted on motor, sensory and deep tendon reflex exam. Skin: No petechial rash or dryness.  Appeared moist.       Lab Results: Lab Results  Component Value Date   WBC 8.4 07/03/2018   HGB 8.6 (L) 07/03/2018   HCT 27.9 (L) 07/03/2018   MCV 103.0 (H) 07/03/2018   PLT 289 07/03/2018     Chemistry      Component Value Date/Time   NA 143 07/03/2018 0818  K 3.9 07/03/2018 0818   CL 107 07/03/2018 0818   CO2 27 07/03/2018 0818   BUN 14 07/03/2018 0818   CREATININE 1.17 07/03/2018 0818   CREATININE 1.07 06/01/2014 1257      Component Value Date/Time   CALCIUM 9.2 07/03/2018 0818   ALKPHOS 55 07/03/2018 0818   AST 19 07/03/2018 0818   ALT 29 07/03/2018 0818   BILITOT 0.4 07/03/2018 0818         Impression and Plan:  52 year old man with:  1.  Leiomyosarcoma of the prostate diagnosed in August 2019.  He presented with a large pelvic mass possible lung metastasis.  Marland Kitchen  He is currently receiving chemotherapy utilizing gemcitabine and Taxotere without any major complications.  He completed 2 cycles of therapy and ready to proceed for cycle 3.  Risks and benefits of this treatment was reviewed again long-term complications were reiterated.  The plan is to complete 3 cycles of therapy and repeat imaging studies for potential 6 cycles.  He is agreeable to proceed at this time.  2.  IV access: Port-A-Cath remains in  use without any issues.  3.  Left common femoral vein DVT: This was diagnosed in October 2019.  He was started on Xarelto and has not had any issues at this time.  I recommended continuing this at least for 6 months.  4.  Urinary obstruction: He has a Foley catheter in place and follow-up with urology in the near future.  5.  Pelvic pain: He has hydrocodone which she uses intermittently.  Pain is under control after the start of chemotherapy.  6.  Follow-up: In 1 week for day 8 of therapy and in 3 weeks for the start of cycle 4  25 minutes was spent with the patient face-to-face today.  More than 50% of time was dedicated to reviewing his disease status, treatment approach and complications associated with therapy     [name], MD 11/11/20198:55 AM

## 2018-07-17 NOTE — Addendum Note (Signed)
Addended by: [name] on: 07/17/2018 10:25 AM   Modules accepted: Orders

## 2018-07-17 NOTE — Patient Instructions (Signed)
[facility] Discharge Instructions for Patients Receiving Chemotherapy  Today you received the following chemotherapy agents:  Gemcitabine  To help prevent nausea and vomiting after your treatment, we encourage you to take your nausea medication as prescribed.    If you develop nausea and vomiting that is not controlled by your nausea medication, call the clinic.   BELOW ARE SYMPTOMS THAT SHOULD BE REPORTED IMMEDIATELY:  *FEVER GREATER THAN 100.5 F  *CHILLS WITH OR WITHOUT FEVER  NAUSEA AND VOMITING THAT IS NOT CONTROLLED WITH YOUR NAUSEA MEDICATION  *UNUSUAL SHORTNESS OF BREATH  *UNUSUAL BRUISING OR BLEEDING  TENDERNESS IN MOUTH AND THROAT WITH OR WITHOUT PRESENCE OF ULCERS  *URINARY PROBLEMS  *BOWEL PROBLEMS  UNUSUAL RASH Items with * indicate a potential emergency and should be followed up as soon as possible.  Feel free to call the clinic should you have any questions or concerns. The clinic phone number is [phone].  Please show the CHEMO ALERT CARD at check-in to the Emergency Department and triage nurse.   

## 2018-07-19 ENCOUNTER — Ambulatory Visit: Payer: BLUE CROSS/BLUE SHIELD

## 2018-07-19 DIAGNOSIS — D400 Neoplasm of uncertain behavior of prostate: Secondary | ICD-10-CM | POA: Diagnosis not present

## 2018-07-19 DIAGNOSIS — R338 Other retention of urine: Secondary | ICD-10-CM | POA: Diagnosis not present

## 2018-07-24 ENCOUNTER — Other Ambulatory Visit: Payer: Self-pay

## 2018-07-24 ENCOUNTER — Telehealth: Payer: Self-pay | Admitting: Oncology

## 2018-07-24 ENCOUNTER — Inpatient Hospital Stay: Payer: BLUE CROSS/BLUE SHIELD

## 2018-07-24 VITALS — BP 126/81 | HR 89 | Temp 98.3°F | Resp 18

## 2018-07-24 DIAGNOSIS — Z95828 Presence of other vascular implants and grafts: Secondary | ICD-10-CM

## 2018-07-24 DIAGNOSIS — Z79899 Other long term (current) drug therapy: Secondary | ICD-10-CM | POA: Diagnosis not present

## 2018-07-24 DIAGNOSIS — C494 Malignant neoplasm of connective and soft tissue of abdomen: Secondary | ICD-10-CM

## 2018-07-24 DIAGNOSIS — Z86718 Personal history of other venous thrombosis and embolism: Secondary | ICD-10-CM | POA: Diagnosis not present

## 2018-07-24 DIAGNOSIS — C7800 Secondary malignant neoplasm of unspecified lung: Secondary | ICD-10-CM

## 2018-07-24 DIAGNOSIS — G629 Polyneuropathy, unspecified: Secondary | ICD-10-CM | POA: Diagnosis not present

## 2018-07-24 DIAGNOSIS — Z7901 Long term (current) use of anticoagulants: Secondary | ICD-10-CM | POA: Diagnosis not present

## 2018-07-24 DIAGNOSIS — Z5189 Encounter for other specified aftercare: Secondary | ICD-10-CM | POA: Diagnosis not present

## 2018-07-24 LAB — CBC WITH DIFFERENTIAL (CANCER CENTER ONLY)
ABS IMMATURE GRANULOCYTES: 0.02 10*3/uL (ref 0.00–0.07)
BASOS PCT: 1 %
Basophils Absolute: 0.1 10*3/uL (ref 0.0–0.1)
Eosinophils Absolute: 0 10*3/uL (ref 0.0–0.5)
Eosinophils Relative: 1 %
HCT: 23.2 % — ABNORMAL LOW (ref 39.0–52.0)
HEMOGLOBIN: 7.2 g/dL — AB (ref 13.0–17.0)
IMMATURE GRANULOCYTES: 0 %
LYMPHS ABS: 1.7 10*3/uL (ref 0.7–4.0)
LYMPHS PCT: 28 %
MCH: 32.3 pg (ref 26.0–34.0)
MCHC: 31 g/dL (ref 30.0–36.0)
MCV: 104 fL — AB (ref 80.0–100.0)
Monocytes Absolute: 0.7 10*3/uL (ref 0.1–1.0)
Monocytes Relative: 11 %
NEUTROS ABS: 3.8 10*3/uL (ref 1.7–7.7)
NEUTROS PCT: 59 %
PLATELETS: 329 10*3/uL (ref 150–400)
RBC: 2.23 MIL/uL — ABNORMAL LOW (ref 4.22–5.81)
RDW: 16.5 % — ABNORMAL HIGH (ref 11.5–15.5)
WBC Count: 6.3 10*3/uL (ref 4.0–10.5)
nRBC: 0.3 % — ABNORMAL HIGH (ref 0.0–0.2)

## 2018-07-24 LAB — CMP (CANCER CENTER ONLY)
ALK PHOS: 54 U/L (ref 38–126)
ALT: 16 U/L (ref 0–44)
ANION GAP: 10 (ref 5–15)
AST: 17 U/L (ref 15–41)
Albumin: 2.9 g/dL — ABNORMAL LOW (ref 3.5–5.0)
BILIRUBIN TOTAL: 0.3 mg/dL (ref 0.3–1.2)
BUN: 13 mg/dL (ref 6–20)
CHLORIDE: 107 mmol/L (ref 98–111)
CO2: 28 mmol/L (ref 22–32)
Calcium: 8.6 mg/dL — ABNORMAL LOW (ref 8.9–10.3)
Creatinine: 1.03 mg/dL (ref 0.61–1.24)
GFR, Est AFR Am: >60 mL/min (ref >60)
GFR, Estimated: >60 mL/min (ref >60)
Glucose, Bld: 124 mg/dL — ABNORMAL HIGH (ref 70–99)
POTASSIUM: 3.5 mmol/L (ref 3.5–5.1)
Sodium: 145 mmol/L (ref 135–145)
TOTAL PROTEIN: 5.9 g/dL — AB (ref 6.5–8.1)

## 2018-07-24 MED ORDER — SODIUM CHLORIDE 0.9 % IV SOLN
Freq: Once | INTRAVENOUS | Status: AC
  Administered 2018-07-24: 09:00:00 via INTRAVENOUS
  Filled 2018-07-24: qty 250

## 2018-07-24 MED ORDER — SODIUM CHLORIDE 0.9 % IV SOLN
2000.0000 mg | Freq: Once | INTRAVENOUS | Status: AC
  Administered 2018-07-24: 2000 mg via INTRAVENOUS
  Filled 2018-07-24: qty 52.6

## 2018-07-24 MED ORDER — DEXAMETHASONE SODIUM PHOSPHATE 10 MG/ML IJ SOLN
10.0000 mg | Freq: Once | INTRAMUSCULAR | Status: AC
  Administered 2018-07-24: 10 mg via INTRAVENOUS

## 2018-07-24 MED ORDER — SODIUM CHLORIDE 0.9 % IV SOLN
100.0000 mg/m2 | Freq: Once | INTRAVENOUS | Status: AC
  Administered 2018-07-24: 220 mg via INTRAVENOUS
  Filled 2018-07-24: qty 22

## 2018-07-24 MED ORDER — SODIUM CHLORIDE 0.9% FLUSH
10.0000 mL | Freq: Once | INTRAVENOUS | Status: AC
  Administered 2018-07-24: 10 mL
  Filled 2018-07-24: qty 10

## 2018-07-24 MED ORDER — DEXAMETHASONE SODIUM PHOSPHATE 10 MG/ML IJ SOLN
INTRAMUSCULAR | Status: AC
  Filled 2018-07-24: qty 1

## 2018-07-24 MED ORDER — SODIUM CHLORIDE 0.9% FLUSH
10.0000 mL | INTRAVENOUS | Status: DC | PRN
  Filled 2018-07-24: qty 10

## 2018-07-24 MED ORDER — HEPARIN SOD (PORK) LOCK FLUSH 100 UNIT/ML IV SOLN
500.0000 [IU] | Freq: Once | INTRAVENOUS | Status: DC | PRN
  Filled 2018-07-24: qty 5

## 2018-07-24 NOTE — Patient Instructions (Signed)
[place] Discharge Instructions for Patients Receiving Chemotherapy  Today you received the following chemotherapy agents :  gemcitabine (Gemzar) and docetaxel (Taxotere)  To help prevent nausea and vomiting after your treatment, we encourage you to take your nausea medication as prescribed.   If you develop nausea and vomiting that is not controlled by your nausea medication, call the clinic.   BELOW ARE SYMPTOMS THAT SHOULD BE REPORTED IMMEDIATELY:  *FEVER GREATER THAN 100.5 F  *CHILLS WITH OR WITHOUT FEVER  NAUSEA AND VOMITING THAT IS NOT CONTROLLED WITH YOUR NAUSEA MEDICATION  *UNUSUAL SHORTNESS OF BREATH  *UNUSUAL BRUISING OR BLEEDING  TENDERNESS IN MOUTH AND THROAT WITH OR WITHOUT PRESENCE OF ULCERS  *URINARY PROBLEMS  *BOWEL PROBLEMS  UNUSUAL RASH Items with * indicate a potential emergency and should be followed up as soon as possible.  Feel free to call the clinic should you have any questions or concerns. The clinic phone number is [phone].  Please show the [place] at check-in to the Emergency Department and triage nurse.

## 2018-07-24 NOTE — Patient Instructions (Signed)
Implanted Port Home Guide An implanted port is a type of central line that is placed under the skin. Central lines are used to provide IV access when treatment or nutrition needs to be given through a person's veins. Implanted ports are used for long-term IV access. An implanted port may be placed because:  You need IV medicine that would be irritating to the small veins in your hands or arms.  You need long-term IV medicines, such as antibiotics.  You need IV nutrition for a long period.  You need frequent blood draws for lab tests.  You need dialysis.  Implanted ports are usually placed in the chest area, but they can also be placed in the upper arm, the abdomen, or the leg. An implanted port has two main parts:  Reservoir. The reservoir is round and will appear as a small, raised area under your skin. The reservoir is the part where a needle is inserted to give medicines or draw blood.  Catheter. The catheter is a thin, flexible tube that extends from the reservoir. The catheter is placed into a large vein. Medicine that is inserted into the reservoir goes into the catheter and then into the vein.  How will I care for my incision site? Do not get the incision site wet. Bathe or shower as directed by your health care provider. How is my port accessed? Special steps must be taken to access the port:  Before the port is accessed, a numbing cream can be placed on the skin. This helps numb the skin over the port site.  Your health care provider uses a sterile technique to access the port. ? Your health care provider must put on a mask and sterile gloves. ? The skin over your port is cleaned carefully with an antiseptic and allowed to dry. ? The port is gently pinched between sterile gloves, and a needle is inserted into the port.  Only "non-coring" port needles should be used to access the port. Once the port is accessed, a blood return should be checked. This helps ensure that the port  is in the vein and is not clogged.  If your port needs to remain accessed for a constant infusion, a clear (transparent) bandage will be placed over the needle site. The bandage and needle will need to be changed every week, or as directed by your health care provider.  Keep the bandage covering the needle clean and dry. Do not get it wet. Follow your health care provider's instructions on how to take a shower or bath while the port is accessed.  If your port does not need to stay accessed, no bandage is needed over the port.  What is flushing? Flushing helps keep the port from getting clogged. Follow your health care provider's instructions on how and when to flush the port. Ports are usually flushed with saline solution or a medicine called heparin. The need for flushing will depend on how the port is used.  If the port is used for intermittent medicines or blood draws, the port will need to be flushed: ? After medicines have been given. ? After blood has been drawn. ? As part of routine maintenance.  If a constant infusion is running, the port may not need to be flushed.  How long will my port stay implanted? The port can stay in for as long as your health care provider thinks it is needed. When it is time for the port to come out, surgery will be   done to remove it. The procedure is similar to the one performed when the port was put in. When should I seek immediate medical care? When you have an implanted port, you should seek immediate medical care if:  You notice a bad smell coming from the incision site.  You have swelling, redness, or drainage at the incision site.  You have more swelling or pain at the port site or the surrounding area.  You have a fever that is not controlled with medicine.  This information is not intended to replace advice given to you by your health care provider. Make sure you discuss any questions you have with your health care provider. Document  Released: 08/23/2005 Document Revised: 01/29/2016 Document Reviewed: 04/30/2013 Elsevier Interactive Patient Education  2017 [employer]  

## 2018-07-24 NOTE — Telephone Encounter (Signed)
Scheduled appt per 11/18 sch message - pt is aware of appt date and time.

## 2018-07-24 NOTE — Progress Notes (Signed)
Per Dr. Alen Blew ok to tx with Hgb 7.2. Pt to receive 2U PRBCs later in the week. Gemzar to be given prior to taxotere per Larene Beach, [facility]. Taxotere recently added to tx plan. Order appropriate in [facility], but taxotere prepared first.  Post discussion with Leanne Chang, RN pt encouraged to call Wednesday morning to confirm time for lab appt if not already notified of lab add-on time prior to injection appt. Type and Screen to be drawn at that time. Pt to be schedule for blood Thursday, Friday, or Saturday. Pt verbalized understanding of plan.

## 2018-07-25 ENCOUNTER — Other Ambulatory Visit: Payer: Self-pay | Admitting: *Deleted

## 2018-07-25 ENCOUNTER — Telehealth: Payer: Self-pay | Admitting: *Deleted

## 2018-07-25 ENCOUNTER — Telehealth: Payer: Self-pay | Admitting: Oncology

## 2018-07-25 MED ORDER — OXYCODONE HCL 5 MG PO TABS: 5.0000 mg | ORAL_TABLET | ORAL | 0 refills | Status: DC | PRN

## 2018-07-25 NOTE — Telephone Encounter (Signed)
Printed medical records for [employer], Release [id number]

## 2018-07-25 NOTE — Telephone Encounter (Signed)
Ok to refill 

## 2018-07-25 NOTE — Telephone Encounter (Signed)
Oxycodone script left at front for patient p/u

## 2018-07-25 NOTE — Telephone Encounter (Signed)
Patient calling to say he needs a refill on his oxycodone. States he is taking 2 every 4 hours for pain.

## 2018-07-26 ENCOUNTER — Other Ambulatory Visit: Payer: Self-pay

## 2018-07-26 ENCOUNTER — Inpatient Hospital Stay: Payer: BLUE CROSS/BLUE SHIELD

## 2018-07-26 DIAGNOSIS — Z86718 Personal history of other venous thrombosis and embolism: Secondary | ICD-10-CM | POA: Diagnosis not present

## 2018-07-26 DIAGNOSIS — C494 Malignant neoplasm of connective and soft tissue of abdomen: Secondary | ICD-10-CM

## 2018-07-26 DIAGNOSIS — Z5189 Encounter for other specified aftercare: Secondary | ICD-10-CM | POA: Diagnosis not present

## 2018-07-26 DIAGNOSIS — Z7901 Long term (current) use of anticoagulants: Secondary | ICD-10-CM | POA: Diagnosis not present

## 2018-07-26 DIAGNOSIS — G629 Polyneuropathy, unspecified: Secondary | ICD-10-CM | POA: Diagnosis not present

## 2018-07-26 DIAGNOSIS — C7800 Secondary malignant neoplasm of unspecified lung: Secondary | ICD-10-CM

## 2018-07-26 DIAGNOSIS — Z79899 Other long term (current) drug therapy: Secondary | ICD-10-CM | POA: Diagnosis not present

## 2018-07-26 DIAGNOSIS — Z95828 Presence of other vascular implants and grafts: Secondary | ICD-10-CM

## 2018-07-26 LAB — CBC WITH DIFFERENTIAL (CANCER CENTER ONLY)
Abs Immature Granulocytes: 0.04 10*3/uL (ref 0.00–0.07)
Basophils Absolute: 0 10*3/uL (ref 0.0–0.1)
Basophils Relative: 0 %
Eosinophils Absolute: 0 10*3/uL (ref 0.0–0.5)
Eosinophils Relative: 0 %
HCT: 21.5 % — ABNORMAL LOW (ref 39.0–52.0)
Hemoglobin: 6.8 g/dL — CL (ref 13.0–17.0)
Immature Granulocytes: 1 %
Lymphocytes Relative: 13 %
Lymphs Abs: 1 10*3/uL (ref 0.7–4.0)
MCH: 33.2 pg (ref 26.0–34.0)
MCHC: 31.6 g/dL (ref 30.0–36.0)
MCV: 104.9 fL — ABNORMAL HIGH (ref 80.0–100.0)
Monocytes Absolute: 0.1 10*3/uL (ref 0.1–1.0)
Monocytes Relative: 1 %
Neutro Abs: 6.6 10*3/uL (ref 1.7–7.7)
Neutrophils Relative %: 85 %
Platelet Count: 225 10*3/uL (ref 150–400)
RBC: 2.05 MIL/uL — ABNORMAL LOW (ref 4.22–5.81)
RDW: 16.8 % — ABNORMAL HIGH (ref 11.5–15.5)
WBC Count: 7.9 10*3/uL (ref 4.0–10.5)
nRBC: 0.5 % — ABNORMAL HIGH (ref 0.0–0.2)

## 2018-07-26 LAB — CMP (CANCER CENTER ONLY)
ALBUMIN: 2.9 g/dL — AB (ref 3.5–5.0)
ALT: 12 U/L (ref 0–44)
AST: 15 U/L (ref 15–41)
Alkaline Phosphatase: 53 U/L (ref 38–126)
Anion gap: 8 (ref 5–15)
BILIRUBIN TOTAL: 0.5 mg/dL (ref 0.3–1.2)
BUN: 16 mg/dL (ref 6–20)
CHLORIDE: 109 mmol/L (ref 98–111)
CO2: 28 mmol/L (ref 22–32)
CREATININE: 1.06 mg/dL (ref 0.61–1.24)
Calcium: 7.9 mg/dL — ABNORMAL LOW (ref 8.9–10.3)
GFR, Est AFR Am: >60 mL/min (ref >60)
GLUCOSE: 147 mg/dL — AB (ref 70–99)
POTASSIUM: 3.3 mmol/L — AB (ref 3.5–5.1)
Sodium: 145 mmol/L (ref 135–145)
TOTAL PROTEIN: 5.6 g/dL — AB (ref 6.5–8.1)

## 2018-07-26 LAB — ABO/RH: ABO/RH(D): O POS

## 2018-07-26 LAB — SAMPLE TO BLOOD BANK

## 2018-07-26 LAB — PREPARE RBC (CROSSMATCH)

## 2018-07-26 MED ORDER — PEGFILGRASTIM-CBQV 6 MG/0.6ML ~~LOC~~ SOSY
6.0000 mg | PREFILLED_SYRINGE | Freq: Once | SUBCUTANEOUS | Status: AC
  Administered 2018-07-26: 6 mg via SUBCUTANEOUS

## 2018-07-26 MED ORDER — HEPARIN SOD (PORK) LOCK FLUSH 100 UNIT/ML IV SOLN
500.0000 [IU] | Freq: Once | INTRAVENOUS | Status: AC
  Administered 2018-07-26: 500 [IU]
  Filled 2018-07-26: qty 5

## 2018-07-26 MED ORDER — SODIUM CHLORIDE 0.9% FLUSH
10.0000 mL | Freq: Once | INTRAVENOUS | Status: AC
  Administered 2018-07-26: 10 mL
  Filled 2018-07-26: qty 10

## 2018-07-26 MED ORDER — PEGFILGRASTIM-CBQV 6 MG/0.6ML ~~LOC~~ SOSY
PREFILLED_SYRINGE | SUBCUTANEOUS | Status: AC
  Filled 2018-07-26: qty 0.6

## 2018-07-26 NOTE — Patient Instructions (Signed)
Pegfilgrastim injection What is this medicine? PEGFILGRASTIM (PEG fil gra stim) is a long-acting granulocyte colony-stimulating factor that stimulates the growth of neutrophils, a type of white blood cell important in the body's fight against infection. It is used to reduce the incidence of fever and infection in patients with certain types of cancer who are receiving chemotherapy that affects the bone marrow, and to increase survival after being exposed to high doses of radiation. This medicine may be used for other purposes; ask your health care provider or pharmacist if you have questions. COMMON BRAND NAME(S): Neulasta What should I tell my health care provider before I take this medicine? They need to know if you have any of these conditions: -kidney disease -latex allergy -ongoing radiation therapy -sickle cell disease -skin reactions to acrylic adhesives (On-Body Injector only) -an unusual or allergic reaction to pegfilgrastim, filgrastim, other medicines, foods, dyes, or preservatives -pregnant or trying to get pregnant -breast-feeding How should I use this medicine? This medicine is for injection under the skin. If you get this medicine at home, you will be taught how to prepare and give the pre-filled syringe or how to use the On-body Injector. Refer to the patient Instructions for Use for detailed instructions. Use exactly as directed. Tell your healthcare provider immediately if you suspect that the On-body Injector may not have performed as intended or if you suspect the use of the On-body Injector resulted in a missed or partial dose. It is important that you put your used needles and syringes in a special sharps container. Do not put them in a trash can. If you do not have a sharps container, call your pharmacist or healthcare provider to get one. Talk to your pediatrician regarding the use of this medicine in children. While this drug may be prescribed for selected conditions,  precautions do apply. Overdosage: If you think you have taken too much of this medicine contact a poison control center or emergency room at once. NOTE: This medicine is only for you. Do not share this medicine with others. What if I miss a dose? It is important not to miss your dose. Call your doctor or health care professional if you miss your dose. If you miss a dose due to an On-body Injector failure or leakage, a new dose should be administered as soon as possible using a single prefilled syringe for manual use. What may interact with this medicine? Interactions have not been studied. Give your health care provider a list of all the medicines, herbs, non-prescription drugs, or dietary supplements you use. Also tell them if you smoke, drink alcohol, or use illegal drugs. Some items may interact with your medicine. This list may not describe all possible interactions. Give your health care provider a list of all the medicines, herbs, non-prescription drugs, or dietary supplements you use. Also tell them if you smoke, drink alcohol, or use illegal drugs. Some items may interact with your medicine. What should I watch for while using this medicine? You may need blood work done while you are taking this medicine. If you are going to need a MRI, CT scan, or other procedure, tell your doctor that you are using this medicine (On-Body Injector only). What side effects may I notice from receiving this medicine? Side effects that you should report to your doctor or health care professional as soon as possible: -allergic reactions like skin rash, itching or hives, swelling of the face, lips, or tongue -dizziness -fever -pain, redness, or irritation at site   where injected -pinpoint red spots on the skin -red or dark-brown urine -shortness of breath or breathing problems -stomach or side pain, or pain at the shoulder -swelling -tiredness -trouble passing urine or change in the amount of urine Side  effects that usually do not require medical attention (report to your doctor or health care professional if they continue or are bothersome): -bone pain -muscle pain This list may not describe all possible side effects. Call your doctor for medical advice about side effects. You may report side effects to FDA at 1-800-FDA-1088. Where should I keep my medicine? Keep out of the reach of children. Store pre-filled syringes in a refrigerator between 2 and 8 degrees C (36 and 46 degrees F). Do not freeze. Keep in carton to protect from light. Throw away this medicine if it is left out of the refrigerator for more than 48 hours. Throw away any unused medicine after the expiration date. NOTE: This sheet is a summary. It may not cover all possible information. If you have questions about this medicine, talk to your doctor, pharmacist, or health care provider.  2018 Elsevier/Gold Standard (2016-08-19 12:58:03)  

## 2018-07-28 ENCOUNTER — Other Ambulatory Visit: Payer: Self-pay | Admitting: *Deleted

## 2018-07-28 MED ORDER — ONDANSETRON HCL 8 MG PO TABS: 8.0000 mg | ORAL_TABLET | Freq: Three times a day (TID) | ORAL | 0 refills | Status: DC | PRN

## 2018-07-29 ENCOUNTER — Inpatient Hospital Stay: Payer: BLUE CROSS/BLUE SHIELD

## 2018-07-29 VITALS — BP 109/68 | HR 92 | Temp 97.4°F | Resp 17

## 2018-07-29 DIAGNOSIS — Z86718 Personal history of other venous thrombosis and embolism: Secondary | ICD-10-CM | POA: Diagnosis not present

## 2018-07-29 DIAGNOSIS — C494 Malignant neoplasm of connective and soft tissue of abdomen: Secondary | ICD-10-CM

## 2018-07-29 DIAGNOSIS — Z79899 Other long term (current) drug therapy: Secondary | ICD-10-CM | POA: Diagnosis not present

## 2018-07-29 DIAGNOSIS — Z7901 Long term (current) use of anticoagulants: Secondary | ICD-10-CM | POA: Diagnosis not present

## 2018-07-29 DIAGNOSIS — G629 Polyneuropathy, unspecified: Secondary | ICD-10-CM | POA: Diagnosis not present

## 2018-07-29 DIAGNOSIS — R110 Nausea: Secondary | ICD-10-CM

## 2018-07-29 DIAGNOSIS — Z5189 Encounter for other specified aftercare: Secondary | ICD-10-CM | POA: Diagnosis not present

## 2018-07-29 MED ORDER — ONDANSETRON HCL 4 MG/2ML IJ SOLN
8.0000 mg | Freq: Once | INTRAMUSCULAR | Status: AC
  Administered 2018-07-29: 8 mg via INTRAVENOUS

## 2018-07-29 MED ORDER — DIPHENHYDRAMINE HCL 25 MG PO CAPS
25.0000 mg | ORAL_CAPSULE | Freq: Once | ORAL | Status: AC
  Administered 2018-07-29: 25 mg via ORAL

## 2018-07-29 MED ORDER — SODIUM CHLORIDE 0.9% IV SOLUTION
250.0000 mL | Freq: Once | INTRAVENOUS | Status: AC
  Administered 2018-07-29: 250 mL via INTRAVENOUS
  Filled 2018-07-29: qty 250

## 2018-07-29 MED ORDER — DIPHENHYDRAMINE HCL 25 MG PO CAPS
ORAL_CAPSULE | ORAL | Status: AC
  Filled 2018-07-29: qty 1

## 2018-07-29 MED ORDER — ACETAMINOPHEN 325 MG PO TABS
ORAL_TABLET | ORAL | Status: AC
  Filled 2018-07-29: qty 2

## 2018-07-29 MED ORDER — ACETAMINOPHEN 325 MG PO TABS
650.0000 mg | ORAL_TABLET | Freq: Once | ORAL | Status: AC
  Administered 2018-07-29: 650 mg via ORAL

## 2018-07-29 MED ORDER — ONDANSETRON HCL 4 MG/2ML IJ SOLN
INTRAMUSCULAR | Status: AC
  Filled 2018-07-29: qty 4

## 2018-07-29 MED ORDER — SODIUM CHLORIDE 0.9% FLUSH
10.0000 mL | INTRAVENOUS | Status: AC | PRN
  Administered 2018-07-29: 10 mL
  Filled 2018-07-29: qty 10

## 2018-07-29 MED ORDER — HEPARIN SOD (PORK) LOCK FLUSH 100 UNIT/ML IV SOLN
500.0000 [IU] | Freq: Every day | INTRAVENOUS | Status: AC | PRN
  Administered 2018-07-29: 500 [IU]
  Filled 2018-07-29: qty 5

## 2018-07-29 MED ORDER — ONDANSETRON HCL 4 MG/2ML IJ SOLN: 4.0000 mg | Freq: Once | INTRAMUSCULAR | Status: DC

## 2018-07-29 NOTE — Patient Instructions (Signed)

## 2018-07-30 DIAGNOSIS — G629 Polyneuropathy, unspecified: Secondary | ICD-10-CM | POA: Diagnosis not present

## 2018-07-30 DIAGNOSIS — Z79899 Other long term (current) drug therapy: Secondary | ICD-10-CM | POA: Diagnosis not present

## 2018-07-30 DIAGNOSIS — C494 Malignant neoplasm of connective and soft tissue of abdomen: Secondary | ICD-10-CM | POA: Diagnosis not present

## 2018-07-30 DIAGNOSIS — Z5189 Encounter for other specified aftercare: Secondary | ICD-10-CM | POA: Diagnosis not present

## 2018-07-30 DIAGNOSIS — Z7901 Long term (current) use of anticoagulants: Secondary | ICD-10-CM | POA: Diagnosis not present

## 2018-07-30 DIAGNOSIS — Z86718 Personal history of other venous thrombosis and embolism: Secondary | ICD-10-CM | POA: Diagnosis not present

## 2018-07-30 LAB — TYPE AND SCREEN
ABO/RH(D): O POS
ANTIBODY SCREEN: NEGATIVE
UNIT DIVISION: 0
UNIT DIVISION: 0

## 2018-07-30 LAB — BPAM RBC
BLOOD PRODUCT EXPIRATION DATE: 201912202359
Blood Product Expiration Date: 201912172359
ISSUE DATE / TIME: 201911231100
ISSUE DATE / TIME: 201911231100
UNIT TYPE AND RH: 5100
Unit Type and Rh: 5100

## 2018-07-31 ENCOUNTER — Encounter: Payer: Self-pay | Admitting: *Deleted

## 2018-07-31 NOTE — Progress Notes (Signed)
Dylan Gonzalez  [name] was referred for housing needs.  CSW spoke to patient by phone.  He reported he's on long term disability through his employer and is looking for more affordable housing.  CSW made referral to [employer] to assist in seeking housing.  CSW reviewed other needs with patient and encouraged him to call with any questions or concerns.     Gwinda Maine, LCSW  Clinical Social Worker [facility]

## 2018-08-01 ENCOUNTER — Observation Stay (HOSPITAL_COMMUNITY)
Admission: EM | Admit: 2018-08-01 | Discharge: 2018-08-05 | Disposition: A | Discharge status: Home Health | Payer: BLUE CROSS/BLUE SHIELD | Attending: Internal Medicine | Admitting: Internal Medicine

## 2018-08-01 ENCOUNTER — Other Ambulatory Visit: Payer: Self-pay

## 2018-08-01 ENCOUNTER — Encounter (HOSPITAL_COMMUNITY): Payer: Self-pay

## 2018-08-01 ENCOUNTER — Emergency Department (HOSPITAL_COMMUNITY): Payer: BLUE CROSS/BLUE SHIELD

## 2018-08-01 DIAGNOSIS — R609 Edema, unspecified: Secondary | ICD-10-CM | POA: Diagnosis not present

## 2018-08-01 DIAGNOSIS — I252 Old myocardial infarction: Secondary | ICD-10-CM | POA: Diagnosis not present

## 2018-08-01 DIAGNOSIS — Z79899 Other long term (current) drug therapy: Secondary | ICD-10-CM | POA: Insufficient documentation

## 2018-08-01 DIAGNOSIS — Z8546 Personal history of malignant neoplasm of prostate: Secondary | ICD-10-CM | POA: Diagnosis not present

## 2018-08-01 DIAGNOSIS — R2681 Unsteadiness on feet: Secondary | ICD-10-CM | POA: Diagnosis not present

## 2018-08-01 DIAGNOSIS — I2510 Atherosclerotic heart disease of native coronary artery without angina pectoris: Secondary | ICD-10-CM | POA: Diagnosis present

## 2018-08-01 DIAGNOSIS — N179 Acute kidney failure, unspecified: Secondary | ICD-10-CM | POA: Diagnosis not present

## 2018-08-01 DIAGNOSIS — I82402 Acute embolism and thrombosis of unspecified deep veins of left lower extremity: Secondary | ICD-10-CM | POA: Diagnosis present

## 2018-08-01 DIAGNOSIS — Z86718 Personal history of other venous thrombosis and embolism: Secondary | ICD-10-CM | POA: Insufficient documentation

## 2018-08-01 DIAGNOSIS — Z85840 Personal history of malignant neoplasm of eye: Secondary | ICD-10-CM | POA: Diagnosis not present

## 2018-08-01 DIAGNOSIS — E876 Hypokalemia: Secondary | ICD-10-CM | POA: Diagnosis not present

## 2018-08-01 DIAGNOSIS — D72829 Elevated white blood cell count, unspecified: Secondary | ICD-10-CM

## 2018-08-01 DIAGNOSIS — Z85118 Personal history of other malignant neoplasm of bronchus and lung: Secondary | ICD-10-CM | POA: Insufficient documentation

## 2018-08-01 DIAGNOSIS — C61 Malignant neoplasm of prostate: Secondary | ICD-10-CM | POA: Diagnosis not present

## 2018-08-01 DIAGNOSIS — I5032 Chronic diastolic (congestive) heart failure: Secondary | ICD-10-CM | POA: Insufficient documentation

## 2018-08-01 DIAGNOSIS — I700 Atherosclerosis of aorta: Secondary | ICD-10-CM | POA: Diagnosis not present

## 2018-08-01 DIAGNOSIS — N182 Chronic kidney disease, stage 2 (mild): Secondary | ICD-10-CM | POA: Diagnosis not present

## 2018-08-01 DIAGNOSIS — I130 Hypertensive heart and chronic kidney disease with heart failure and stage 1 through stage 4 chronic kidney disease, or unspecified chronic kidney disease: Secondary | ICD-10-CM | POA: Insufficient documentation

## 2018-08-01 DIAGNOSIS — R000 Tachycardia, unspecified: Secondary | ICD-10-CM | POA: Diagnosis not present

## 2018-08-01 DIAGNOSIS — B9561 Methicillin susceptible Staphylococcus aureus infection as the cause of diseases classified elsewhere: Secondary | ICD-10-CM | POA: Diagnosis not present

## 2018-08-01 DIAGNOSIS — I10 Essential (primary) hypertension: Secondary | ICD-10-CM | POA: Diagnosis present

## 2018-08-01 DIAGNOSIS — R600 Localized edema: Secondary | ICD-10-CM | POA: Diagnosis present

## 2018-08-01 DIAGNOSIS — N390 Urinary tract infection, site not specified: Secondary | ICD-10-CM | POA: Diagnosis present

## 2018-08-01 DIAGNOSIS — D696 Thrombocytopenia, unspecified: Secondary | ICD-10-CM | POA: Diagnosis not present

## 2018-08-01 DIAGNOSIS — M7989 Other specified soft tissue disorders: Secondary | ICD-10-CM

## 2018-08-01 DIAGNOSIS — I491 Atrial premature depolarization: Secondary | ICD-10-CM | POA: Diagnosis not present

## 2018-08-01 DIAGNOSIS — R110 Nausea: Secondary | ICD-10-CM | POA: Diagnosis not present

## 2018-08-01 DIAGNOSIS — C7800 Secondary malignant neoplasm of unspecified lung: Secondary | ICD-10-CM | POA: Diagnosis present

## 2018-08-01 DIAGNOSIS — D4959 Neoplasm of unspecified behavior of other genitourinary organ: Secondary | ICD-10-CM | POA: Diagnosis present

## 2018-08-01 DIAGNOSIS — Z7901 Long term (current) use of anticoagulants: Secondary | ICD-10-CM | POA: Diagnosis not present

## 2018-08-01 DIAGNOSIS — C679 Malignant neoplasm of bladder, unspecified: Secondary | ICD-10-CM | POA: Diagnosis not present

## 2018-08-01 DIAGNOSIS — D539 Nutritional anemia, unspecified: Secondary | ICD-10-CM | POA: Diagnosis not present

## 2018-08-01 DIAGNOSIS — C494 Malignant neoplasm of connective and soft tissue of abdomen: Secondary | ICD-10-CM | POA: Diagnosis present

## 2018-08-01 DIAGNOSIS — N136 Pyonephrosis: Secondary | ICD-10-CM | POA: Diagnosis not present

## 2018-08-01 HISTORY — DX: Secondary malignant neoplasm of unspecified lung: C78.00

## 2018-08-01 HISTORY — DX: Malignant neoplasm of unspecified retina: C69.20

## 2018-08-01 LAB — CBC
HCT: 29.5 % — ABNORMAL LOW (ref 39.0–52.0)
Hemoglobin: 9.2 g/dL — ABNORMAL LOW (ref 13.0–17.0)
MCH: 32.5 pg (ref 26.0–34.0)
MCHC: 31.2 g/dL (ref 30.0–36.0)
MCV: 104.2 fL — ABNORMAL HIGH (ref 80.0–100.0)
PLATELETS: 126 10*3/uL — AB (ref 150–400)
RBC: 2.83 MIL/uL — ABNORMAL LOW (ref 4.22–5.81)
RDW: 17.9 % — AB (ref 11.5–15.5)
WBC: 44.6 10*3/uL — ABNORMAL HIGH (ref 4.0–10.5)
nRBC: 6.6 % — ABNORMAL HIGH (ref 0.0–0.2)

## 2018-08-01 LAB — COMPREHENSIVE METABOLIC PANEL
ALT: 21 U/L (ref 0–44)
ANION GAP: 9 (ref 5–15)
AST: 27 U/L (ref 15–41)
Albumin: 3 g/dL — ABNORMAL LOW (ref 3.5–5.0)
Alkaline Phosphatase: 113 U/L (ref 38–126)
BUN: 7 mg/dL (ref 6–20)
CALCIUM: 8.5 mg/dL — AB (ref 8.9–10.3)
CHLORIDE: 104 mmol/L (ref 98–111)
CO2: 28 mmol/L (ref 22–32)
Creatinine, Ser: 1.33 mg/dL — ABNORMAL HIGH (ref 0.61–1.24)
GFR calc non Af Amer: >60 mL/min (ref >60)
Glucose, Bld: 105 mg/dL — ABNORMAL HIGH (ref 70–99)
POTASSIUM: 3.2 mmol/L — AB (ref 3.5–5.1)
SODIUM: 141 mmol/L (ref 135–145)
Total Bilirubin: 0.7 mg/dL (ref 0.3–1.2)
Total Protein: 6.3 g/dL — ABNORMAL LOW (ref 6.5–8.1)

## 2018-08-01 LAB — URINALYSIS, ROUTINE W REFLEX MICROSCOPIC
Bilirubin Urine: NEGATIVE
Glucose, UA: NEGATIVE mg/dL
Ketones, ur: NEGATIVE mg/dL
NITRITE: POSITIVE — AB
PH: 6 (ref 5.0–8.0)
Protein, ur: 100 mg/dL — AB
RBC / HPF: >50 RBC/hpf — ABNORMAL HIGH (ref 0–5)
SPECIFIC GRAVITY, URINE: 1.009 (ref 1.005–1.030)
WBC, UA: >50 WBC/hpf — ABNORMAL HIGH (ref 0–5)

## 2018-08-01 LAB — MAGNESIUM: MAGNESIUM: 1.8 mg/dL (ref 1.7–2.4)

## 2018-08-01 MED ORDER — MORPHINE SULFATE (PF) 4 MG/ML IV SOLN
4.0000 mg | Freq: Once | INTRAVENOUS | Status: AC
  Administered 2018-08-01: 4 mg via INTRAVENOUS
  Filled 2018-08-01: qty 1

## 2018-08-01 MED ORDER — ONDANSETRON HCL 4 MG PO TABS: 4.0000 mg | ORAL_TABLET | Freq: Four times a day (QID) | ORAL | Status: DC | PRN

## 2018-08-01 MED ORDER — SODIUM CHLORIDE 0.9 % IV SOLN
1000.0000 mL | INTRAVENOUS | Status: DC
  Administered 2018-08-01: 1000 mL via INTRAVENOUS

## 2018-08-01 MED ORDER — ONDANSETRON HCL 4 MG/2ML IJ SOLN
4.0000 mg | Freq: Four times a day (QID) | INTRAMUSCULAR | Status: DC | PRN
  Administered 2018-08-03: 4 mg via INTRAVENOUS
  Filled 2018-08-01: qty 2

## 2018-08-01 MED ORDER — ACETAMINOPHEN 325 MG PO TABS
650.0000 mg | ORAL_TABLET | Freq: Four times a day (QID) | ORAL | Status: DC | PRN
  Administered 2018-08-05: 650 mg via ORAL
  Filled 2018-08-01: qty 2

## 2018-08-01 MED ORDER — SODIUM CHLORIDE 0.9 % IV BOLUS (SEPSIS)
500.0000 mL | Freq: Once | INTRAVENOUS | Status: AC
  Administered 2018-08-01: 500 mL via INTRAVENOUS

## 2018-08-01 MED ORDER — ONDANSETRON HCL 4 MG/2ML IJ SOLN
4.0000 mg | Freq: Once | INTRAMUSCULAR | Status: AC
  Administered 2018-08-01: 4 mg via INTRAVENOUS
  Filled 2018-08-01: qty 2

## 2018-08-01 MED ORDER — POTASSIUM CHLORIDE IN NACL 40-0.9 MEQ/L-% IV SOLN
INTRAVENOUS | Status: DC
  Administered 2018-08-02: 125 mL/h via INTRAVENOUS
  Filled 2018-08-01 (×2): qty 1000

## 2018-08-01 MED ORDER — ACETAMINOPHEN 650 MG RE SUPP: 650.0000 mg | Freq: Four times a day (QID) | RECTAL | Status: DC | PRN

## 2018-08-01 MED ORDER — ENOXAPARIN SODIUM 100 MG/ML ~~LOC~~ SOLN
100.0000 mg | SUBCUTANEOUS | Status: AC
  Administered 2018-08-01: 100 mg via SUBCUTANEOUS
  Filled 2018-08-01: qty 1

## 2018-08-01 MED ORDER — LIDOCAINE-PRILOCAINE 2.5-2.5 % EX CREA
TOPICAL_CREAM | Freq: Once | CUTANEOUS | Status: AC
  Administered 2018-08-01: 1 via TOPICAL
  Filled 2018-08-01: qty 5

## 2018-08-01 NOTE — ED Notes (Signed)
EMLA cream applied to port site per patient request.

## 2018-08-01 NOTE — ED Provider Notes (Signed)
[facility] DEPT Provider Note   CSN: [id number] Arrival date & time: 08/01/18  1908     History   Chief Complaint Chief Complaint  Patient presents with  . Weakness  . Leg Swelling    HPI STEVENS MAGWOOD is a 52 y.o. male.  HPI Patient presents to the emergency room for evaluation of nausea, abdominal discomfort and leg swelling.  Patient has a history of prostatic cancer.  He is currently being treated for bladder and prostate cancer.  Patient states his last chemotherapy treatment was about a week ago.  He has been having difficulty with issues with nausea and feeling fevers the last couple of days.  He has had lower extremity swelling bilaterally but worse on the left side.  He has not been eating well with the nausea but denies any vomiting.  He denies any diarrhea.  No chest pain or shortness of breath.  He is having pain in his lower abdomen and groin which she associates with his cancer.  The highest temperature he is measured has been 99.5 Past Medical History:  Diagnosis Date  . Hypertension   . MI (myocardial infarction) [facility])    2007    Patient Active Problem List   Diagnosis Date Noted  . Port-A-Cath in place 06/29/2018  . Goals of care, counseling/discussion 05/15/2018  . Bilateral ureteral obstruction 04/19/2018  . 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 Prostate neoplasm 04/13/2018  . Hypertension 12/26/2011  . 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 CYSTOSCOPY W/ URETERAL STENT PLACEMENT Bilateral 04/13/2018   Procedure: CYSTOSCOPY WITH BILATERAL STENT REPLACEMENT;  Surgeon: Irine Seal, MD;  Location: [facility];  Service: Urology;  Laterality: Bilateral;  . eye removed    . HERNIA REPAIR    . INTRAOCULAR PROSTHESES INSERTION    . IR IMAGING GUIDED PORT INSERTION  06/21/2018  .  PROSTATE BIOPSY [name] 04/13/2018   Procedure: ULTRASOUND GUIDED PROSTATE BIOPSY;  Surgeon: Irine Seal, MD;  Location: [facility];  Service: Urology;  Laterality: [name];  . TOOTH EXTRACTION Right 04/21/2013   Procedure: EXTRACTION MOLARS;  Surgeon: [name], DDS;  Location: [facility];  Service: Oral Surgery;  Laterality: Right;  . TRANSURETHRAL RESECTION OF PROSTATE  04/13/2018   Procedure: TRANSURETHRAL RESECTION OF THE PROSTATE (TURP);  Surgeon: Irine Seal, MD;  Location: [facility];  Service: Urology;;        Home Medications    Prior to Admission medications   Medication Sig Start Date End Date Taking? Authorizing Provider  albuterol (PROVENTIL HFA) 108 (90 Base) MCG/ACT inhaler Inhale 1-2 puffs into the lungs every 6 (six) hours as needed for wheezing or shortness of breath.   Yes [provider]  ALPRAZolam Duanne Moron) 0.5 MG tablet Take 0.5 mg by mouth 2 (two) times daily as needed for anxiety.  05/31/18  Yes [provider]  amLODipine (NORVASC) 10 MG tablet Take 1 tablet (10 mg total) by mouth daily. 04/19/18  Yes Georgette Shell, MD  Docusate Calcium (STOOL SOFTENER PO) Take 1 tablet by mouth daily as needed (constipation).    Yes [provider]  fluticasone (FLONASE) 50 MCG/ACT nasal spray PLACE 2 SPRAYS INTO BOTH NOSTRILS DAILY. Patient taking differently: Place 2 sprays into both nostrils daily as needed for allergies.  07/22/14  Yes Le, Thao P, DO  hydrochlorothiazide (HYDRODIURIL) 25 MG tablet Take 25  mg by mouth daily.  05/02/18  Yes [provider]  hydrOXYzine (VISTARIL) 100 MG capsule TAKE ONE CAPSULE BY MOUTH AT BEDTIME AS NEEDED FOR ALLERGY Patient taking differently: Take 100 mg by mouth at bedtime as needed for itching.  10/12/15  Yes Robyn Haber, MD  lidocaine-prilocaine (EMLA) cream Apply 1 application topically as needed. Patient taking differently: Apply 1 application topically as needed (port access).  06/22/18  Yes [name], MD    metoprolol succinate (TOPROL-XL) 50 MG 24 hr tablet Take 50 mg by mouth daily. 05/17/18  Yes [provider]  oxyCODONE (OXY IR/ROXICODONE) 5 MG immediate release tablet Take 1-2 tablets (5-10 mg total) by mouth every 4 (four) hours as needed for severe pain. 07/25/18  Yes [name], MD  polyethylene glycol (MIRALAX / GLYCOLAX) packet Take 17 g by mouth 2 (two) times daily as needed for mild constipation or moderate constipation. 04/23/18  Yes Pollina, Gwenyth Allegra, MD  rivaroxaban (XARELTO) 20 MG TABS tablet Take 1 tablet (20 mg total) by mouth daily with supper. 07/17/18  Yes [name], MD  belladonna-opium (B&O SUPPRETTES) 16.2-30 MG suppository Place 1 suppository (30 mg total) rectally every 8 (eight) hours as needed for pain. Patient not taking: Reported on 06/27/2018 04/23/18   Orpah Greek, MD  cefUROXime (CEFTIN) 250 MG tablet Take 1 tablet (250 mg total) by mouth 2 (two) times daily with a meal. Patient not taking: Reported on 06/29/2018 04/23/18   Orpah Greek, MD  HYDROcodone-acetaminophen (NORCO/VICODIN) 5-325 MG tablet Take 1 tablet by mouth every 6 (six) hours as needed for moderate pain. Patient not taking: Reported on 08/01/2018 05/15/18 05/15/19  [name], MD  hydrocortisone cream 1 % Apply to affected area 2 times daily Patient not taking: Reported on 06/27/2018 08/05/17   Langston Masker B, PA-C  Hyoscyamine Sulfate SL (LEVSIN/SL) 0.125 MG SUBL Place 0.125 mg under the tongue every 8 (eight) hours as needed. Patient not taking: Reported on 08/01/2018 04/19/18   Georgette Shell, MD  metoprolol tartrate (LOPRESSOR) 50 MG tablet Take 1 tablet (50 mg total) by mouth 2 (two) times daily. Patient not taking: Reported on 08/01/2018 04/19/18   Georgette Shell, MD  ondansetron (ZOFRAN) 8 MG tablet Take 1 tablet (8 mg total) by mouth every 8 (eight) hours as needed for nausea or vomiting. 07/28/18   [name], MD  phenazopyridine  (PYRIDIUM) 200 MG tablet Take 1 tablet (200 mg total) by mouth 3 (three) times daily as needed (burning). Patient not taking: Reported on 08/01/2018 04/19/18   Georgette Shell, MD  prochlorperazine (COMPAZINE) 10 MG tablet Take 1 tablet (10 mg total) by mouth every 6 (six) hours as needed for nausea or vomiting. Patient not taking: Reported on 08/01/2018 05/15/18   [name], MD  Rivaroxaban 15 & 20 MG TBPK Take as directed on package: Start with one 15mg  tablet by mouth twice a day with food. On Day 22, switch to one 20mg  tablet once a day with food. Patient not taking: Reported on 08/01/2018 06/23/18   [name], MD    Family History Family History  Problem Relation Age of Onset  . Hypertension Father   . Depression Brother     Social History Social History   Tobacco Use  . Smoking status: Never Smoker  . Smokeless tobacco: Never Used  Substance Use Topics  . Alcohol use: No  . Drug use: No     Allergies  Pollen extract   Review of Systems Review of Systems  All other systems reviewed and are negative.    Physical Exam Updated Vital Signs BP 115/78 (BP Location: Left Arm)   Pulse 91   Temp 98.3 F (36.8 C) (Oral)   Resp 20   Ht 1.803 m (5\' 11" )   Wt 97.1 kg   SpO2 99%   BMI 29.85 kg/m   Physical Exam  Constitutional: No distress.  HENT:  Head: Normocephalic and atraumatic.  Right Ear: External ear normal.  Left Ear: External ear normal.  Eyes: Conjunctivae are normal. Right eye exhibits no discharge. Left eye exhibits no discharge. No scleral icterus.  Neck: Neck supple. No tracheal deviation present.  Cardiovascular: Normal rate, regular rhythm and intact distal pulses.  Pulmonary/Chest: Effort normal and breath sounds normal. No stridor. No respiratory distress. He has no wheezes. He has no rales.  Abdominal: Soft. Bowel sounds are normal. He exhibits no distension. There is tenderness. There is no rebound and no guarding.  Mild  suprapubic  Musculoskeletal: He exhibits edema. He exhibits no tenderness.  Edema bilateral lower extremities left greater than right, the patient does have the Foley catheter leg bag on his left thigh and the elastic does seem to be rather tight  Neurological: He is alert. He has normal strength. No cranial nerve deficit (no facial droop, extraocular movements intact, no slurred speech) or sensory deficit. He exhibits normal muscle tone. He displays no seizure activity. Coordination normal.  Skin: Skin is warm and dry. No rash noted.  Psychiatric: He has a normal mood and affect.  Nursing note and vitals reviewed.    ED Treatments / Results  Labs (all labs ordered are listed, but only abnormal results are displayed) Labs Reviewed  CBC - Abnormal; Notable for the following components:      Result Value   WBC 44.6 (*)    RBC 2.83 (*)    Hemoglobin 9.2 (*)    HCT 29.5 (*)    MCV 104.2 (*)    RDW 17.9 (*)    Platelets 126 (*)    nRBC 6.6 (*)    All other components within normal limits  COMPREHENSIVE METABOLIC PANEL - Abnormal; Notable for the following components:   Potassium 3.2 (*)    Glucose, Bld 105 (*)    Creatinine, Ser 1.33 (*)    Calcium 8.5 (*)    Total Protein 6.3 (*)    Albumin 3.0 (*)    All other components within normal limits  URINE CULTURE  CULTURE, BLOOD (ROUTINE X 2)  CULTURE, BLOOD (ROUTINE X 2)  URINALYSIS, ROUTINE W REFLEX MICROSCOPIC    EKG None  Radiology Dg Chest 2 View  Result Date: 08/01/2018 CLINICAL DATA:  52 y/o M; receiving treatment for prostate and bladder cancer. Fever and nausea. EXAM: CHEST - 2 VIEW COMPARISON:  06/29/2018 chest radiograph. FINDINGS: Stable normal cardiac silhouette given projection and technique. Stable port catheter tip projecting over upper SVC. Aortic atherosclerosis with calcification. Clear lungs. No pleural effusion or pneumothorax. No acute osseous abnormality is evident. IMPRESSION: No active cardiopulmonary  disease. Electronically Signed   By: Kristine Garbe M.D.   On: 08/01/2018 20:31    Procedures Procedures (including critical care time)  Medications Ordered in ED Medications  sodium chloride 0.9 % bolus 500 mL (500 mLs Intravenous New Bag/Given 08/01/18 2223)    Followed by  0.9 %  sodium chloride infusion (1,000 mLs Intravenous New Bag/Given 08/01/18 2225)  ondansetron (ZOFRAN)  injection 4 mg (4 mg Intravenous Given 08/01/18 2126)  morphine 4 MG/ML injection 4 mg (4 mg Intravenous Given 08/01/18 2126)  lidocaine-prilocaine (EMLA) cream (1 application Topical Given 08/01/18 2038)     Initial Impression / Assessment and Plan / ED Course  I have reviewed the triage vital signs and the nursing notes.  Pertinent labs & imaging results that were available during my care of the patient were reviewed by me and considered in my medical decision making (see chart for details).  Clinical Course as of Aug 01 2302  Tue Aug 01, 2018  2232 Pegfilgrastim given on 11/10   [JK]  2233 Patient's hemoglobin has been improved since prior.  White blood cell count is significantly more elevated compared to prior   [JK]  2234 UN and creatinine slightly elevated compared to prior   [JK]    Clinical Course User Index [JK] Dorie Rank, MD    Patient presented to the emergency room for evaluation of weakness, leg swelling.  Patient has a history of malignancy and is currently receiving chemotherapy treatments.  Patient felt febrile and nauseated at home.  He is also noticed significant swelling in his legs, left greater than right.  Patient does have asymmetric edema.  He is at risk for DVT however I did that the bands on his Foley catheter bag are rather tight on his leg.  This may be contributing to the asymmetric edema.  I do think the patient warrants a lower extremity DVT study.  That is not available this evening.  he is not sure he will be able to get transportation to come back to the  hospital.  Patient also has a significant leukocytosis.  This may be the result of his PEG filgrastim rather than infection.  Will consult with medical service for admission, observation.  Final Clinical Impressions(s) / ED Diagnoses   Final diagnoses:  Peripheral edema  Leukocytosis, unspecified type      Dorie Rank, MD 08/01/18 2303

## 2018-08-01 NOTE — ED Triage Notes (Addendum)
Per EMS, patient is currently being treated for prostate and bladder cancer. He had a blood transfusion Saturday and his last chemo infusion was Monday.   He is complaining of fever and nausea for the past couple of days. He also has had swelling and tenderness in bilateral lower extremities that has been present for a few weeks. Denies vomiting, diarrhea, and chest pain.

## 2018-08-01 NOTE — H&P (Signed)
History and Physical    Dylan Gonzalez QIO:[id number] DOB: Jul 04, 1966 DOA: 08/01/2018  PCP: Donald Prose, MD   Patient coming from: Home.  I have personally briefly reviewed patient's old medical records in [place]  Chief Complaint: Fever and nausea.  HPI: Dylan Gonzalez is a 52 y.o. 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  He also states he had 4 episodes of diarrhea 2 days ago, but has since then resolved.  He recently had rhinorrhea and mild productive cough of whitish/clear phlegm with "some red tinge".  He also has had progressively worse bilateral lower extremities edema, more affected on the left for the past week and a half.  He denies headache, sore throat, dyspnea, wheezing, chest pain, palpitations, dizziness, diaphoresis, PND, or orthopnea.  He denies constipation, melena or hematochezia.  He has had some dysuria and frequency.  He denies polyuria, polydipsia, polyphagia or blurred vision.  ED Course: Initial vital signs temperature 98.3 F, pulse 94, respirations 12, blood pressure 115/80 mmHg and O2 sat 99% on room air.  The patient was given a 500 mL bolus, 4 mg of morphine IVP, 4 mg of ondansetron IVP and 100 mg of Lovenox subcutaneously.  Blood culture and sensitivity x2 drawn.  Urine culture and sensitivity was also sent.  Urinalysis shows large hemoglobinuria, proteinuria 100 mg/dL, positive nitrites, large leukocyte esterase, more than 50 RBC and more than 50 WBC per hpf with few bacteria on microscopic examination.  His white count was 44.6, hemoglobin 9.2 g/dL and platelets 126.  No differential is available.  CMP shows a potassium of 3.2 mmol/L, all the other electrolytes are within normal limits when  calcium is corrected to an albumin of 3.0 g/dL.  BUN was 7, creatinine 1.33 and glucose 105 mg/dL.  Total protein was 6.3 g/dL.  AST ALT, alkaline phosphatase and total bilirubin are within normal limits.  Imaging: His chest radiograph did not have any acute cardiopulmonary disease.  Please see images and full radiology report for further detail.  Review of Systems: As per HPI otherwise 10 point review of systems negative.  Past Medical History:  Diagnosis Date  . Hypertension   . Malignant neoplasm metastatic to lung ([place]) 04/19/2018  . MI (myocardial infarction) ([place])    2007  . Retinoblastoma, unilateral ([place]) 12/26/2011   Left eye enucleation     Past Surgical History:  Procedure Laterality Date  . CYSTOSCOPY W/ URETERAL STENT PLACEMENT Bilateral 04/13/2018   Procedure: CYSTOSCOPY WITH BILATERAL STENT REPLACEMENT;  Surgeon: Irine Seal, MD;  Location: [facility];  Service: Urology;  Laterality: Bilateral;  . eye removed    . HERNIA REPAIR    . INTRAOCULAR PROSTHESES INSERTION    . IR IMAGING GUIDED PORT INSERTION  06/21/2018  . PROSTATE BIOPSY [name] 04/13/2018   Procedure: ULTRASOUND GUIDED PROSTATE BIOPSY;  Surgeon: Irine Seal, MD;  Location: [facility];  Service: Urology;  Laterality: [name];  . TOOTH EXTRACTION Right 04/21/2013   Procedure: EXTRACTION MOLARS;  Surgeon: [name], DDS;  Location: [place];  Service: Oral Surgery;  Laterality: Right;  . TRANSURETHRAL RESECTION OF PROSTATE  04/13/2018   Procedure: TRANSURETHRAL RESECTION OF THE PROSTATE (TURP);  Surgeon: Irine Seal, MD;  Location: [facility];  Service: Urology;;  reports that he has never smoked. He has never used smokeless tobacco. He reports that he does not drink alcohol or use drugs.  Allergies  Allergen Reactions  . Pollen Extract Other (See Comments)    Sneezing and shortness of breath    Family History  Problem Relation Age of Onset  . Hypertension Father   . Depression Brother    Prior to Admission medications     Medication Sig Start Date End Date Taking? Authorizing Provider  albuterol (PROVENTIL HFA) 108 (90 Base) MCG/ACT inhaler Inhale 1-2 puffs into the lungs every 6 (six) hours as needed for wheezing or shortness of breath.   Yes [provider]  ALPRAZolam Duanne Moron) 0.5 MG tablet Take 0.5 mg by mouth 2 (two) times daily as needed for anxiety.  05/31/18  Yes [provider]  amLODipine (NORVASC) 10 MG tablet Take 1 tablet (10 mg total) by mouth daily. 04/19/18  Yes Georgette Shell, MD  Docusate Calcium (STOOL SOFTENER PO) Take 1 tablet by mouth daily as needed (constipation).    Yes [provider]  fluticasone (FLONASE) 50 MCG/ACT nasal spray PLACE 2 SPRAYS INTO BOTH NOSTRILS DAILY. Patient taking differently: Place 2 sprays into both nostrils daily as needed for allergies.  07/22/14  Yes Le, Thao P, DO  hydrochlorothiazide (HYDRODIURIL) 25 MG tablet Take 25 mg by mouth daily.  05/02/18  Yes [provider]  hydrOXYzine (VISTARIL) 100 MG capsule TAKE ONE CAPSULE BY MOUTH AT BEDTIME AS NEEDED FOR ALLERGY Patient taking differently: Take 100 mg by mouth at bedtime as needed for itching.  10/12/15  Yes Robyn Haber, MD  lidocaine-prilocaine (EMLA) cream Apply 1 application topically as needed. Patient taking differently: Apply 1 application topically as needed (port access).  06/22/18  Yes [name], MD  metoprolol succinate (TOPROL-XL) 50 MG 24 hr tablet Take 50 mg by mouth daily. 05/17/18  Yes [provider]  oxyCODONE (OXY IR/ROXICODONE) 5 MG immediate release tablet Take 1-2 tablets (5-10 mg total) by mouth every 4 (four) hours as needed for severe pain. 07/25/18  Yes [name], MD  polyethylene glycol (MIRALAX / GLYCOLAX) packet Take 17 g by mouth 2 (two) times daily as needed for mild constipation or moderate constipation. 04/23/18  Yes Pollina, Gwenyth Allegra, MD  rivaroxaban (XARELTO) 20 MG TABS tablet Take 1 tablet (20 mg total) by mouth  daily with supper. 07/17/18  Yes [name], MD  belladonna-opium (B&O SUPPRETTES) 16.2-30 MG suppository Place 1 suppository (30 mg total) rectally every 8 (eight) hours as needed for pain. Patient not taking: Reported on 06/27/2018 04/23/18   Orpah Greek, MD  cefUROXime (CEFTIN) 250 MG tablet Take 1 tablet (250 mg total) by mouth 2 (two) times daily with a meal. Patient not taking: Reported on 06/29/2018 04/23/18   Orpah Greek, MD  HYDROcodone-acetaminophen (NORCO/VICODIN) 5-325 MG tablet Take 1 tablet by mouth every 6 (six) hours as needed for moderate pain. Patient not taking: Reported on 08/01/2018 05/15/18 05/15/19  [name], MD  hydrocortisone cream 1 % Apply to affected area 2 times daily Patient not taking: Reported on 06/27/2018 08/05/17   Langston Masker B, PA-C  Hyoscyamine Sulfate SL (LEVSIN/SL) 0.125 MG SUBL Place 0.125 mg under the tongue every 8 (eight) hours as needed. Patient not taking: Reported on 08/01/2018 04/19/18   Georgette Shell, MD  metoprolol tartrate (LOPRESSOR) 50 MG tablet Take 1 tablet (50 mg total) by mouth 2 (two) times daily. Patient not  taking: Reported on 08/01/2018 04/19/18   Georgette Shell, MD  ondansetron (ZOFRAN) 8 MG tablet Take 1 tablet (8 mg total) by mouth every 8 (eight) hours as needed for nausea or vomiting. 07/28/18   [name], MD  phenazopyridine (PYRIDIUM) 200 MG tablet Take 1 tablet (200 mg total) by mouth 3 (three) times daily as needed (burning). Patient not taking: Reported on 08/01/2018 04/19/18   Georgette Shell, MD  prochlorperazine (COMPAZINE) 10 MG tablet Take 1 tablet (10 mg total) by mouth every 6 (six) hours as needed for nausea or vomiting. Patient not taking: Reported on 08/01/2018 05/15/18   [name], MD  Rivaroxaban 15 & 20 MG TBPK Take as directed on package: Start with one 15mg  tablet by mouth twice a day with food. On Day 22, switch to one 20mg  tablet once a day with  food. Patient not taking: Reported on 08/01/2018 06/23/18   [name], MD    Physical Exam: Vitals:   08/01/18 1931 08/01/18 1932 08/01/18 2139 08/01/18 2230  BP:  115/80 116/78 115/78  Pulse:  94 95 91  Resp:  12 (!) 21 20  Temp:  98.3 F (36.8 C)    TempSrc:  Oral    SpO2:  99% 97% 99%  Weight: 97.1 kg     Height: 5\' 11"  (1.803 m)       Constitutional: Looks chronically ill, but otherwise in NAD, calm, comfortable Eyes: PERRL, lids and conjunctivae normal ENMT: Mucous membranes are dry. Posterior pharynx clear of any exudate or lesions. Neck: normal, supple, no masses, no thyromegaly Respiratory: clear to auscultation bilaterally, no wheezing, no crackles. Normal respiratory effort. No accessory muscle use.  Cardiovascular: Regular rate and rhythm, no murmurs / rubs / gallops. No extremity edema. 2+ pedal pulses. No carotid bruits.  Abdomen: Bowel sounds positive. Soft, positive suprapubic and lower quadrants tenderness, no rebound, no masses palpated. No hepatosplenomegaly.  The patient has a Foley catheter. Musculoskeletal: no clubbing / cyanosis. Good ROM, no contractures. Normal muscle tone.  Skin: no rashes, lesions, ulcers on limited dermatological evaluation. Neurologic: CN 2-12 grossly intact. Sensation intact, DTR normal. Strength 5/5 in all 4.  Psychiatric: Normal judgment and insight. Alert and oriented x 3. Normal mood.   Labs on Admission: I have personally reviewed following labs and imaging studies  CBC: Recent Labs  Lab 07/26/18 1245 08/01/18 2111  WBC 7.9 44.6*  NEUTROABS 6.6  --   HGB 6.8* 9.2*  HCT 21.5* 29.5*  MCV 104.9* 104.2*  PLT 225 650*   Basic Metabolic Panel: Recent Labs  Lab 07/26/18 1245 08/01/18 2111 08/01/18 2309  NA 145 141  --   K 3.3* 3.2*  --   CL 109 104  --   CO2 28 28  --   GLUCOSE 147* 105*  --   BUN 16 7  --   CREATININE 1.06 1.33*  --   CALCIUM 7.9* 8.5*  --   MG  --   --  1.8   GFR: Estimated Creatinine  Clearance: 77.2 mL/min (A) (by C-G formula based on SCr of 1.33 mg/dL (H)). Liver Function Tests: Recent Labs  Lab 07/26/18 1245 08/01/18 2111  AST 15 27  ALT 12 21  ALKPHOS 53 113  BILITOT 0.5 0.7  PROT 5.6* 6.3*  ALBUMIN 2.9* 3.0*   No results for input(s): LIPASE, AMYLASE in the last 168 hours. No results for input(s): AMMONIA in the last 168 hours. Coagulation Profile: No results for  input(s): INR, PROTIME in the last 168 hours. Cardiac Enzymes: No results for input(s): CKTOTAL, CKMB, CKMBINDEX, TROPONINI in the last 168 hours. BNP (last 3 results) No results for input(s): PROBNP in the last 8760 hours. HbA1C: No results for input(s): HGBA1C in the last 72 hours. CBG: No results for input(s): GLUCAP in the last 168 hours. Lipid Profile: No results for input(s): CHOL, HDL, LDLCALC, TRIG, CHOLHDL, LDLDIRECT in the last 72 hours. Thyroid Function Tests: No results for input(s): TSH, T4TOTAL, FREET4, T3FREE, THYROIDAB in the last 72 hours. Anemia Panel: No results for input(s): VITAMINB12, FOLATE, FERRITIN, TIBC, IRON, RETICCTPCT in the last 72 hours. Urine analysis:    Component Value Date/Time   COLORURINE YELLOW 08/01/2018 2242   APPEARANCEUR CLOUDY (A) 08/01/2018 2242   LABSPEC 1.009 08/01/2018 2242   PHURINE 6.0 08/01/2018 2242   GLUCOSEU NEGATIVE 08/01/2018 2242   HGBUR LARGE (A) 08/01/2018 2242   BILIRUBINUR NEGATIVE 08/01/2018 2242   BILIRUBINUR neg 06/01/2014 1333   [place] 08/01/2018 2242   PROTEINUR 100 (A) 08/01/2018 2242   UROBILINOGEN 0.2 04/19/2015 0907   NITRITE POSITIVE (A) 08/01/2018 2242   LEUKOCYTESUR LARGE (A) 08/01/2018 2242    Radiological Exams on Admission: Dg Chest 2 View  Result Date: 08/01/2018 CLINICAL DATA:  52 y/o M; receiving treatment for prostate and bladder cancer. Fever and nausea. EXAM: CHEST - 2 VIEW COMPARISON:  06/29/2018 chest radiograph. FINDINGS: Stable normal cardiac silhouette given projection and technique.  Stable port catheter tip projecting over upper SVC. Aortic atherosclerosis with calcification. Clear lungs. No pleural effusion or pneumothorax. No acute osseous abnormality is evident. IMPRESSION: No active cardiopulmonary disease. Electronically Signed   By: Kristine Garbe M.D.   On: 08/01/2018 20:31    EKG: Independently reviewed.    Assessment/Plan Principal Problem:   Bilateral lower extremity edema History of DVTs. Observation/MedSurg. Given Lovenox by ED, however the patient is already on Xarelto. Follow-up Doppler ultrasound. Continue Lovenox in a.m. or resume Xarelto.  Active Problems:   Acute UTI Start cefepime. Follow-up urine cultures and sensitivity. Consider urology evaluation in a.m.    Deep vein thrombosis (DVT) of left lower extremity (HCC) Continue anticoagulation. Follow-up Doppler ultrasound in a.m.    Hypertension Continue amlodipine 10 mg p.o. daily. Continue metoprolol 50 mg p.o. daily. Continue hydrochlorothiazide 25 mg p.o. daily. Monitor BP, HR, renal function electrolytes.    Prostate neoplasm   Primary leiomyosarcoma of intra-abdominal site [facility])   Malignant neoplasm metastatic to lung [facility]) He has had 3 cycles of Taxotere with gemcitabine, which he was started on 05/30/2018. Follow-up with oncology as scheduled.    CAD (coronary artery disease) On metoprolol, Amlodipine and anticoagulation.    Hypokalemia Correcting. Magnesium was supplemented. Follow-up potassium level in a.m.    DVT prophylaxis: On Xarelto at home.  Received a dose of Lovenox 1 mg/kg in ED. Code Status: Full code. Family Communication: Disposition Plan: Observation for Doppler ultrasound morning. Consults called: Admission status: Observation/MedSurg.   Reubin Milan MD [facility] Pager [phone].  If 7PM-7AM, please contact night-coverage [email or web address] Password [facility]  08/01/2018, 11:51 PM

## 2018-08-01 NOTE — Progress Notes (Signed)
ANTICOAGULATION CONSULT NOTE - Initial Consult  Pharmacy Consult for Lovenox Indication: DVT - On PTA Xarelto, LD 11/25 2100  Allergies  Allergen Reactions  . Pollen Extract Other (See Comments)    Sneezing and shortness of breath    Patient Measurements: Height: 5\' 11"  (180.3 cm) Weight: 214 lb (97.1 kg) IBW/kg (Calculated) : 75.3   Vital Signs: Temp: 98.3 F (36.8 C) (11/26 1932) Temp Source: Oral (11/26 1932) BP: 115/78 (11/26 2230) Pulse Rate: 91 (11/26 2230)  Labs: Recent Labs    08/01/18 2111  HGB 9.2*  HCT 29.5*  PLT 126*  CREATININE 1.33*    Estimated Creatinine Clearance: 77.2 mL/min (A) (by C-G formula based on SCr of 1.33 mg/dL (H)).   Medical History: Past Medical History:  Diagnosis Date  . Hypertension   . Malignant neoplasm metastatic to lung ([place]) 04/19/2018  . MI (myocardial infarction) ([place])    2007  . Retinoblastoma, unilateral ([place]) 12/26/2011   Left eye enucleation     Medications:  Scheduled:   Infusions:  . sodium chloride 1,000 mL (08/01/18 2225)    Assessment: 69 yoM on PTA xarelto for hx of DVT now with weakness and leg swelling.    Goal of Therapy:  Anti-Xa level 0.6-1 units/ml 4hrs after LMWH dose given    Plan:  Lovenox 100 mg x1 then 95 mg sq q12h F/u resume of xarelto?  Lawana Pai R 08/01/2018,11:48 PM

## 2018-08-01 NOTE — ED Notes (Signed)
Bed: HE03 Expected date:  Expected time:  Means of arrival:  Comments: 52 yr old fever, cancer pt

## 2018-08-01 NOTE — ED Notes (Signed)
ED TO INPATIENT HANDOFF REPORT  Name/Age/Gender Dylan Gonzalez 52 y.o. male  Code Status Code Status History    Date Active Date Inactive Code Status Order ID Comments User Context   04/13/2018 1637 04/19/2018 1713 Full Code [id number]  Irine Seal, MD Inpatient      Home/SNF/Other Home  Chief Complaint generalized weakness, fever  Level of Care/Admitting Diagnosis ED Disposition    ED Disposition Condition [facility]: [facility] [[id number]]  Level of Care: Med-Surg [16]  Diagnosis: Bilateral lower extremity edema [756433]  Admitting Physician: Reubin Milan [[id number]]  Attending Physician: Reubin Milan [[id number]]  PT Class (Do Not Modify): Observation [104]  PT Acc Code (Do Not Modify): Observation [10022]       Medical History Past Medical History:  Diagnosis Date  . Hypertension   . MI (myocardial infarction) ([place])    2007    Allergies Allergies  Allergen Reactions  . Pollen Extract Other (See Comments)    Sneezing and shortness of breath    IV Location/Drains/Wounds Patient Lines/Drains/Airways Status   Active Line/Drains/Airways    Name:   Placement date:   Placement time:   Site:   Days:   Implanted Port 06/21/18 Right Chest   06/21/18    1400    Chest   41   Peripheral IV 08/01/18 Left Antecubital   08/01/18    1921    Antecubital   less than 1   Urethral Catheter Shelia, RN [place];Non-latex   06/23/18    1829    [place];Non-latex   39   Ureteral Drain/Stent Right ureter 6 Fr.   04/13/18    1250    Right ureter   110   Ureteral Drain/Stent Left ureter 6 Fr.   04/13/18    1257    Left ureter   110   Incision - 1 Port Abdomen Right   06/21/18    -     41   Incision - 1 Port Lateral;Right;Superior   06/21/18    -     41          Labs/Imaging Results for orders placed or performed during the hospital encounter of 08/01/18 (from the past 48 hour(s))  CBC     Status: Abnormal   Collection Time: 08/01/18   9:11 PM  Result Value Ref Range   WBC 44.6 (H) 4.0 - 10.5 K/uL   RBC 2.83 (L) 4.22 - 5.81 MIL/uL   Hemoglobin 9.2 (L) 13.0 - 17.0 g/dL   HCT 29.5 (L) 39.0 - 52.0 %   MCV 104.2 (H) 80.0 - 100.0 fL   MCH 32.5 26.0 - 34.0 pg   MCHC 31.2 30.0 - 36.0 g/dL   RDW 17.9 (H) 11.5 - 15.5 %   Platelets 126 (L) 150 - 400 K/uL    Comment: REPEATED TO VERIFY SPECIMEN CHECKED FOR CLOTS    nRBC 6.6 (H) 0.0 - 0.2 %    Comment: Performed at [facility], [place] [address], [place], [place] [address]  Comprehensive metabolic panel     Status: Abnormal   Collection Time: 08/01/18  9:11 PM  Result Value Ref Range   Sodium 141 135 - 145 mmol/L   Potassium 3.2 (L) 3.5 - 5.1 mmol/L   Chloride 104 98 - 111 mmol/L   CO2 28 22 - 32 mmol/L   Glucose, Bld 105 (H) 70 - 99 mg/dL   BUN 7 6 - 20 mg/dL  Creatinine, Ser 1.33 (H) 0.61 - 1.24 mg/dL   Calcium 8.5 (L) 8.9 - 10.3 mg/dL   Total Protein 6.3 (L) 6.5 - 8.1 g/dL   Albumin 3.0 (L) 3.5 - 5.0 g/dL   AST 27 15 - 41 U/L   ALT 21 0 - 44 U/L   Alkaline Phosphatase 113 38 - 126 U/L   Total Bilirubin 0.7 0.3 - 1.2 mg/dL   GFR calc non Af Amer >60 >60 mL/min   GFR calc Af Amer >60 >60 mL/min   Anion gap 9 5 - 15    Comment: Performed at [facility], [place] [address], [place], [place] [address]  Urinalysis, Routine w reflex microscopic     Status: Abnormal   Collection Time: 08/01/18 10:42 PM  Result Value Ref Range   Color, Urine YELLOW YELLOW   APPearance CLOUDY (A) CLEAR   Specific Gravity, Urine 1.009 1.005 - 1.030   pH 6.0 5.0 - 8.0   Glucose, UA NEGATIVE NEGATIVE mg/dL   Hgb urine dipstick LARGE (A) NEGATIVE   Bilirubin Urine NEGATIVE NEGATIVE   Ketones, ur NEGATIVE NEGATIVE mg/dL   Protein, ur 100 (A) NEGATIVE mg/dL   Nitrite POSITIVE (A) NEGATIVE   Leukocytes, UA LARGE (A) NEGATIVE   RBC / HPF >50 (H) 0 - 5 RBC/hpf   WBC, UA >50 (H) 0 - 5 WBC/hpf   Bacteria, UA FEW (A) NONE SEEN   WBC Clumps PRESENT      Comment: Performed at [facility], [place] [address] [place], [place] [address]   Dg Chest 2 View  Result Date: 08/01/2018 CLINICAL DATA:  52 y/o M; receiving treatment for prostate and bladder cancer. Fever and nausea. EXAM: CHEST - 2 VIEW COMPARISON:  06/29/2018 chest radiograph. FINDINGS: Stable normal cardiac silhouette given projection and technique. Stable port catheter tip projecting over upper SVC. Aortic atherosclerosis with calcification. Clear lungs. No pleural effusion or pneumothorax. No acute osseous abnormality is evident. IMPRESSION: No active cardiopulmonary disease. Electronically Signed   By: Kristine Garbe M.D.   On: 08/01/2018 20:31    Pending Labs Unresulted Labs (From admission, onward)    Start     Ordered   08/01/18 2309  Magnesium  Add-on,   R     08/01/18 2308   08/01/18 2300  Blood culture (routine x 2)  BLOOD CULTURE X 2,   STAT     08/01/18 2259   08/01/18 1954  Urine Culture  Once,   STAT     08/01/18 1953          Vitals/Pain Today's Vitals   08/01/18 1949 08/01/18 2139 08/01/18 2224 08/01/18 2230  BP:  116/78  115/78  Pulse:  95  91  Resp:  (!) 21  20  Temp:      TempSrc:      SpO2:  97%  99%  Weight:      Height:      PainSc: 4   0-No pain     Isolation Precautions No active isolations  Medications Medications  sodium chloride 0.9 % bolus 500 mL (0 mLs Intravenous Stopped 08/01/18 2253)    Followed by  0.9 %  sodium chloride infusion (1,000 mLs Intravenous New Bag/Given 08/01/18 2225)  enoxaparin (LOVENOX) injection 100 mg (has no administration in time range)  ondansetron (ZOFRAN) injection 4 mg (4 mg Intravenous Given 08/01/18 2126)  morphine 4 MG/ML injection 4 mg (4 mg Intravenous Given 08/01/18 2126)  lidocaine-prilocaine (EMLA) cream (1  application Topical Given 08/01/18 2038)    Mobility walks

## 2018-08-01 NOTE — ED Notes (Signed)
Patient made aware that urine sample is needed.  

## 2018-08-02 ENCOUNTER — Observation Stay (HOSPITAL_BASED_OUTPATIENT_CLINIC_OR_DEPARTMENT_OTHER): Payer: BLUE CROSS/BLUE SHIELD

## 2018-08-02 DIAGNOSIS — R609 Edema, unspecified: Secondary | ICD-10-CM | POA: Diagnosis not present

## 2018-08-02 DIAGNOSIS — D649 Anemia, unspecified: Secondary | ICD-10-CM

## 2018-08-02 DIAGNOSIS — I10 Essential (primary) hypertension: Secondary | ICD-10-CM | POA: Diagnosis not present

## 2018-08-02 DIAGNOSIS — C7800 Secondary malignant neoplasm of unspecified lung: Secondary | ICD-10-CM

## 2018-08-02 DIAGNOSIS — N390 Urinary tract infection, site not specified: Secondary | ICD-10-CM | POA: Diagnosis not present

## 2018-08-02 DIAGNOSIS — D4959 Neoplasm of unspecified behavior of other genitourinary organ: Secondary | ICD-10-CM

## 2018-08-02 DIAGNOSIS — E876 Hypokalemia: Secondary | ICD-10-CM

## 2018-08-02 DIAGNOSIS — R601 Generalized edema: Secondary | ICD-10-CM

## 2018-08-02 DIAGNOSIS — D696 Thrombocytopenia, unspecified: Secondary | ICD-10-CM

## 2018-08-02 DIAGNOSIS — C494 Malignant neoplasm of connective and soft tissue of abdomen: Secondary | ICD-10-CM

## 2018-08-02 DIAGNOSIS — D72829 Elevated white blood cell count, unspecified: Secondary | ICD-10-CM

## 2018-08-02 DIAGNOSIS — R600 Localized edema: Secondary | ICD-10-CM | POA: Diagnosis not present

## 2018-08-02 DIAGNOSIS — M7989 Other specified soft tissue disorders: Secondary | ICD-10-CM

## 2018-08-02 DIAGNOSIS — I82402 Acute embolism and thrombosis of unspecified deep veins of left lower extremity: Secondary | ICD-10-CM | POA: Diagnosis present

## 2018-08-02 LAB — COMPREHENSIVE METABOLIC PANEL
ALBUMIN: 2.7 g/dL — AB (ref 3.5–5.0)
ALT: 18 U/L (ref 0–44)
ANION GAP: 6 (ref 5–15)
AST: 24 U/L (ref 15–41)
Alkaline Phosphatase: 96 U/L (ref 38–126)
BUN: 7 mg/dL (ref 6–20)
CO2: 28 mmol/L (ref 22–32)
Calcium: 8 mg/dL — ABNORMAL LOW (ref 8.9–10.3)
Chloride: 106 mmol/L (ref 98–111)
Creatinine, Ser: 1.26 mg/dL — ABNORMAL HIGH (ref 0.61–1.24)
Glucose, Bld: 155 mg/dL — ABNORMAL HIGH (ref 70–99)
Potassium: 3.4 mmol/L — ABNORMAL LOW (ref 3.5–5.1)
SODIUM: 140 mmol/L (ref 135–145)
TOTAL PROTEIN: 5.4 g/dL — AB (ref 6.5–8.1)
Total Bilirubin: 0.5 mg/dL (ref 0.3–1.2)

## 2018-08-02 LAB — BRAIN NATRIURETIC PEPTIDE: B Natriuretic Peptide: 330.4 pg/mL — ABNORMAL HIGH (ref 0.0–100.0)

## 2018-08-02 LAB — CBC WITH DIFFERENTIAL/PLATELET
ABS IMMATURE GRANULOCYTES: 5.93 10*3/uL — AB (ref 0.00–0.07)
BASOS ABS: 0.1 10*3/uL (ref 0.0–0.1)
BASOS PCT: 0 %
EOS PCT: 0 %
Eosinophils Absolute: 0.1 10*3/uL (ref 0.0–0.5)
HEMATOCRIT: 27.6 % — AB (ref 39.0–52.0)
Hemoglobin: 8.6 g/dL — ABNORMAL LOW (ref 13.0–17.0)
Immature Granulocytes: 15 %
LYMPHS PCT: 7 %
Lymphs Abs: 2.6 10*3/uL (ref 0.7–4.0)
MCH: 33.1 pg (ref 26.0–34.0)
MCHC: 31.2 g/dL (ref 30.0–36.0)
MCV: 106.2 fL — AB (ref 80.0–100.0)
MONO ABS: 4 10*3/uL — AB (ref 0.1–1.0)
Monocytes Relative: 10 %
NRBC: 5 % — AB (ref 0.0–0.2)
Neutro Abs: 27.8 10*3/uL — ABNORMAL HIGH (ref 1.7–7.7)
Neutrophils Relative %: 68 %
PLATELETS: 129 10*3/uL — AB (ref 150–400)
RBC: 2.6 MIL/uL — AB (ref 4.22–5.81)
RDW: 18 % — AB (ref 11.5–15.5)
WBC: 40.5 10*3/uL — AB (ref 4.0–10.5)

## 2018-08-02 LAB — ECHOCARDIOGRAM COMPLETE
Height: 71 in
Weight: 3424 oz

## 2018-08-02 LAB — PHOSPHORUS: PHOSPHORUS: 3.2 mg/dL (ref 2.5–4.6)

## 2018-08-02 LAB — MAGNESIUM: MAGNESIUM: 2 mg/dL (ref 1.7–2.4)

## 2018-08-02 MED ORDER — SODIUM CHLORIDE 0.9 % IV SOLN
1.0000 g | Freq: Every day | INTRAVENOUS | Status: DC
  Administered 2018-08-02: 1 g via INTRAVENOUS
  Filled 2018-08-02: qty 1

## 2018-08-02 MED ORDER — POLYETHYLENE GLYCOL 3350 17 G PO PACK
17.0000 g | PACK | Freq: Two times a day (BID) | ORAL | Status: DC | PRN
  Administered 2018-08-03 – 2018-08-04 (×2): 17 g via ORAL
  Filled 2018-08-02 (×3): qty 1

## 2018-08-02 MED ORDER — ENOXAPARIN SODIUM 100 MG/ML ~~LOC~~ SOLN
95.0000 mg | Freq: Two times a day (BID) | SUBCUTANEOUS | Status: DC
  Administered 2018-08-02: 95 mg via SUBCUTANEOUS
  Filled 2018-08-02: qty 1

## 2018-08-02 MED ORDER — OXYCODONE HCL 5 MG PO TABS
5.0000 mg | ORAL_TABLET | ORAL | Status: DC | PRN
  Administered 2018-08-02: 10 mg via ORAL
  Administered 2018-08-02: 5 mg via ORAL
  Administered 2018-08-03 – 2018-08-05 (×5): 10 mg via ORAL
  Filled 2018-08-02: qty 1
  Filled 2018-08-02 (×6): qty 2

## 2018-08-02 MED ORDER — SODIUM CHLORIDE 0.9% FLUSH
10.0000 mL | INTRAVENOUS | Status: DC | PRN
  Administered 2018-08-02 – 2018-08-05 (×2): 10 mL
  Filled 2018-08-02 (×2): qty 40

## 2018-08-02 MED ORDER — ALPRAZOLAM 0.5 MG PO TABS
0.5000 mg | ORAL_TABLET | Freq: Two times a day (BID) | ORAL | Status: DC | PRN
  Administered 2018-08-03 – 2018-08-05 (×3): 0.5 mg via ORAL
  Filled 2018-08-02: qty 1
  Filled 2018-08-02: qty 2
  Filled 2018-08-02: qty 1

## 2018-08-02 MED ORDER — METOPROLOL SUCCINATE ER 50 MG PO TB24
50.0000 mg | ORAL_TABLET | Freq: Every day | ORAL | Status: DC
  Administered 2018-08-02 – 2018-08-05 (×4): 50 mg via ORAL
  Filled 2018-08-02 (×4): qty 1

## 2018-08-02 MED ORDER — MAGNESIUM SULFATE 2 GM/50ML IV SOLN
2.0000 g | Freq: Once | INTRAVENOUS | Status: AC
  Administered 2018-08-02: 2 g via INTRAVENOUS
  Filled 2018-08-02: qty 50

## 2018-08-02 MED ORDER — SODIUM CHLORIDE 0.9% FLUSH
10.0000 mL | Freq: Two times a day (BID) | INTRAVENOUS | Status: DC
  Administered 2018-08-03 – 2018-08-05 (×2): 10 mL

## 2018-08-02 MED ORDER — HYDROCHLOROTHIAZIDE 25 MG PO TABS
25.0000 mg | ORAL_TABLET | Freq: Every day | ORAL | Status: DC
  Administered 2018-08-02: 25 mg via ORAL
  Filled 2018-08-02: qty 1

## 2018-08-02 MED ORDER — SODIUM CHLORIDE 0.9 % IV SOLN
1.0000 g | Freq: Three times a day (TID) | INTRAVENOUS | Status: DC
  Administered 2018-08-02 – 2018-08-04 (×7): 1 g via INTRAVENOUS
  Filled 2018-08-02 (×8): qty 1

## 2018-08-02 MED ORDER — RIVAROXABAN 20 MG PO TABS
20.0000 mg | ORAL_TABLET | Freq: Every day | ORAL | Status: DC
  Administered 2018-08-02 – 2018-08-05 (×4): 20 mg via ORAL
  Filled 2018-08-02 (×4): qty 1

## 2018-08-02 MED ORDER — POTASSIUM CHLORIDE CRYS ER 20 MEQ PO TBCR
40.0000 meq | EXTENDED_RELEASE_TABLET | Freq: Two times a day (BID) | ORAL | Status: DC
  Administered 2018-08-02 – 2018-08-05 (×7): 40 meq via ORAL
  Filled 2018-08-02 (×7): qty 2

## 2018-08-02 MED ORDER — DOCUSATE SODIUM 100 MG PO CAPS
100.0000 mg | ORAL_CAPSULE | Freq: Every day | ORAL | Status: DC | PRN
  Administered 2018-08-02 – 2018-08-05 (×3): 100 mg via ORAL
  Filled 2018-08-02 (×4): qty 1

## 2018-08-02 MED ORDER — AMLODIPINE BESYLATE 10 MG PO TABS
10.0000 mg | ORAL_TABLET | Freq: Every day | ORAL | Status: DC
  Administered 2018-08-02 – 2018-08-05 (×4): 10 mg via ORAL
  Filled 2018-08-02 (×4): qty 1

## 2018-08-02 NOTE — Progress Notes (Signed)
Events last 24 hours noted.  [name] was hospitalized for fever and lower extremity edema.  His work-up did not reveal any deep vein thrombosis has been chronically anticoagulated on Xarelto.  He does have a urinary tract infection.  I agree with the current management the primary team.  His leukocytosis is related to growth factor support rather than worsening sepsis.  It would be reasonable to discharge him on Xarelto once medically stable.

## 2018-08-02 NOTE — Progress Notes (Signed)
Echocardiogram 2D Echocardiogram has been performed.  Dylan Gonzalez 08/02/2018, 2:07 PM

## 2018-08-02 NOTE — Progress Notes (Signed)
*  Preliminary Results* Bilateral lower extremity venous duplex completed. Bilateral lower extremities are negative for deep vein thrombosis. There is no evidence of Baker's cyst bilaterally.  08/02/2018 1:14 PM Abreanna Drawdy Dawna Part

## 2018-08-02 NOTE — Progress Notes (Signed)
PROGRESS NOTE    Dylan Gonzalez  OFB:[id number] DOB: 1965-10-06 DOA: 08/01/2018 PCP: Donald Prose, MD   Brief Narrative:  HPI per Dr. Tennis Must on 08/01/18 Dylan Gonzalez is a 52 y.o. 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  He also states he had 4 episodes of diarrhea 2 days ago, but has since then resolved.  He recently had rhinorrhea and mild productive cough of whitish/clear phlegm with "some red tinge".  He also has had progressively worse bilateral lower extremities edema, more affected on the left for the past week and a half.  He denies headache, sore throat, dyspnea, wheezing, chest pain, palpitations, dizziness, diaphoresis, PND, or orthopnea.  He denies constipation, melena or hematochezia.  He has had some dysuria and frequency.  He denies polyuria, polydipsia, polyphagia or blurred vision.  **Admitted for bilateral lower extremity edema and acute UTI.  Work-up revealed no evidence of any acute DVT patient was transitioned back to his home Lovenox.  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 -Placed In Observation/MedSurg. -Given Lovenox by ED, however the patient is already on Xarelto and will change back to [facility] -Follow-up Doppler ultrasound showed no evidence of any DVT in the right or left lower extremities and this was read by  Dr. Harold Barban -Cussed the case with Dr. [name] of Oncology who recommends placing the patient back on his home Xarelto -Check echocardiogram to rule out heart failure as patient's BNP was elevated on admission -Chest x-ray showed no evidence of any vascular congestion and there is no acute cardiopulmonary disease -Patient denied any orthopnea but states that his legs are more swollen than usual and possibly secondary to hypoalbuminemia -Obtain nutrition consult  Acute UTI -U/A showed cloudy urine, large hemoglobin, large leukocytes, positive nitrites, few bacteria, greater than 50 WBCs, and greater than 50 RBCs per high-power field -C/w IV Cefepime due to previous Urine Sensitivities. -Follow-up urine cultures and sensitivity. -Consider urological evaluation if not improving -Measure PVRs -Blood cultures and urine cultures still pending  History of Deep vein thrombosis (DVT) of left lower extremity (HCC) -Continue anticoagulation. -Follow-up Doppler ultrasound this a.m. revealed no acute DVT  Hypertension -Continue Amlodipine 10 mg p.o. daily. -Continue metoprolol 50 mg p.o. daily. -Hold hydrochlorothiazide 25 mg p.o. Daily given his renal function and hyperkalemia -Continue Monitor BP, HR, renal function electrolytes.  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 -Follow-up with oncology as scheduled. -Dr. [name] notified via epic as a courtesy  CAD (coronary artery disease) -On metoprolol and anticoagulation. -Check ECHOCardiogram  Hypokalemia -His potassium on admission was 3.2 and this morning was 3.4 -Replete with p.o. potassium chloride 40 mg twice daily x2 doses -Continue monitor replete as necessary -Repeat CMP in a.m.  Leukocytosis -Patient's  WBC on admission was 44.6 now is 40.5 -In the setting  of Neulasta injection a week ago -Patient is already on IV antibiotics with IV cefepime for urinary tract infection -Continue to monitor for signs and symptoms of infection -Repeat CBC in the a.m.  Macrocytic Anemia -Has been having a elevated MCV and range from 104-106.2 recently -We will/hematocrit went from 9.2/29.5 and is now down to 8.6/27.6 -Likely in the setting of chemotherapy -Continue to monitor for signs and symptoms of bleeding -Repeat CBC in a.m.  Thrombocytopenia -Likely in the setting of antineoplastic drugs with chemotherapy -Patient platelet count admission was 126 now 129 -Continue to monitor for signs and symptoms of bleeding -Repeat CBC in a.m.  CKD Stage2/Renal Insufficiency -Patient baseline creatinine is around 1.1-1.2 -Slightly elevated on admission have held hydrochlorothiazide -Continue to monitor and repeat CMP in a.m. -Patient BUN/creatinine 7/1.26 to close to baseline  DVT prophylaxis: Was anticoagulated with Full dose Lovenox 1 mg/kg but will transition back to Home Xarelto  Code Status: FULL CODE Family Communication: No family present at bedside  Disposition Plan: Remain Inpatient for further work up and treatment   Consultants:   Medical Oncology Dr. [name] notified via EPIC   Procedures:  BILATERAL LE VENOUS DUPLEX Bilateral lower extremity venous duplex completed. Bilateral lower extremities are negative for deep vein thrombosis. There is no evidence of Baker's cyst bilaterally.  ECHOCARDIOGRAM (Done and Pending Read)   Antimicrobials:  Anti-infectives (From admission, onward)   Start     Dose/Rate Route Frequency Ordered Stop   08/02/18 0200  ceFEPIme (MAXIPIME) 1 g in sodium chloride 0.9 % 100 mL IVPB     1 g 200 mL/hr over 30 Minutes Intravenous Every 8 hours 08/02/18 0112     08/02/18 0030  cefTRIAXone (ROCEPHIN) 1 g in sodium chloride 0.9 % 100 mL IVPB  Status:  Discontinued     1 g 200 mL/hr over 30 Minutes Intravenous  Daily at bedtime 08/02/18 0017 08/02/18 0112     Subjective: Seen and examined at bedside and states that his legs are swollen.  Still felt bad and mildly nauseous.  No chest pain, lightheadedness or dizziness.  No nausea or vomiting.  No other concerns or complaints at this time.  Objective: Vitals:   08/01/18 2230 08/02/18 0049 08/02/18 0517 08/02/18 1417  BP: 115/78 120/79 126/83 111/76  Pulse: 91 97 96 (!) 101  Resp: 20 20 18    Temp:  98.6 F (37 C) 98.7 F (37.1 C)   TempSrc:  Oral Oral   SpO2: 99% 100% 98% 97%  Weight:      Height:        Intake/Output Summary (Last 24 hours) at 08/02/2018 1632 Last data filed at 08/02/2018 1500 Gross per 24 hour  Intake 740 ml  Output 450 ml  Net 290 ml   Filed Weights   08/01/18 1931  Weight: 97.1 kg   Examination: Physical Exam:  Constitutional: WN/WD overweight AAM in NAD and appears calm but appears uncomfortable Eyes: Lids and conjunctivae normal, sclerae anicteric  ENMT: External Ears, Nose appear normal. Grossly normal hearing. Neck: Appears normal, supple, no cervical masses, normal ROM, no appreciable thyromegaly; no JVD Respiratory: Diminished to auscultation bilaterally, no wheezing, rales, rhonchi or crackles. Normal respiratory effort and patient is not tachypenic. No accessory muscle use.  Cardiovascular: RRR, no murmurs / rubs / gallops. S1 and S2 auscultated. 1-2+ LE extremity edema with Left worse than right .  Abdomen: Soft, non-tender, mildly distended.  No masses palpated. No appreciable hepatosplenomegaly. Bowel sounds positive x4.  GU: Deferred. Musculoskeletal: No clubbing / cyanosis of digits/nails. No joint deformity upper and lower extremities.  Skin: No rashes, lesions, ulcers on a limited skin evaluation. No induration; Warm and dry.  Neurologic: CN 2-12 grossly intact with no focal deficits. Romberg sign and cerebellar reflexes not assessed.  Psychiatric: Normal judgment and insight. Alert and oriented  x 3. Normal mood and appropriate affect.   Data Reviewed: I have personally reviewed following labs and imaging studies  CBC: Recent Labs  Lab 08/01/18 2111 08/02/18 0930  WBC 44.6* 40.5*  NEUTROABS  --  27.8*  HGB 9.2* 8.6*  HCT 29.5* 27.6*  MCV 104.2* 106.2*  PLT 126* 106*   Basic Metabolic Panel: Recent Labs  Lab 08/01/18 2111 08/01/18 2309 08/02/18 0930  NA 141  --  140  K 3.2*  --  3.4*  CL 104  --  106  CO2 28  --  28  GLUCOSE 105*  --  155*  BUN 7  --  7  CREATININE 1.33*  --  1.26*  CALCIUM 8.5*  --  8.0*  MG  --  1.8 2.0  PHOS  --   --  3.2   GFR: Estimated Creatinine Clearance: 81.5 mL/min (A) (by C-G formula based on SCr of 1.26 mg/dL (H)). Liver Function Tests: Recent Labs  Lab 08/01/18 2111 08/02/18 0930  AST 27 24  ALT 21 18  ALKPHOS 113 96  BILITOT 0.7 0.5  PROT 6.3* 5.4*  ALBUMIN 3.0* 2.7*   No results for input(s): LIPASE, AMYLASE in the last 168 hours. No results for input(s): AMMONIA in the last 168 hours. Coagulation Profile: No results for input(s): INR, PROTIME in the last 168 hours. Cardiac Enzymes: No results for input(s): CKTOTAL, CKMB, CKMBINDEX, TROPONINI in the last 168 hours. BNP (last 3 results) No results for input(s): PROBNP in the last 8760 hours. HbA1C: No results for input(s): HGBA1C in the last 72 hours. CBG: No results for input(s): GLUCAP in the last 168 hours. Lipid Profile: No results for input(s): CHOL, HDL, LDLCALC, TRIG, CHOLHDL, LDLDIRECT in the last 72 hours. Thyroid Function Tests: No results for input(s): TSH, T4TOTAL, FREET4, T3FREE, THYROIDAB in the last 72 hours. Anemia Panel: No results for input(s): VITAMINB12, FOLATE, FERRITIN, TIBC, IRON, RETICCTPCT in the last 72 hours. Sepsis Labs: No results for input(s): PROCALCITON, LATICACIDVEN in the last 168 hours.  No results found for this or any previous visit (from the past 240 hour(s)).   Radiology Studies: Dg Chest 2 View  Result Date:  08/01/2018 CLINICAL DATA:  52 y/o M; receiving treatment for prostate and bladder cancer. Fever and nausea. EXAM: CHEST - 2 VIEW COMPARISON:  06/29/2018 chest radiograph. FINDINGS: Stable normal cardiac silhouette given projection and technique. Stable port catheter tip projecting over upper SVC. Aortic atherosclerosis with calcification. Clear lungs. No pleural effusion or pneumothorax. No acute osseous abnormality is evident. IMPRESSION: No active cardiopulmonary disease. Electronically Signed   By: Kristine Garbe M.D.   On: 08/01/2018 20:31   Vas [place] Lower Extremity Venous (dvt) (only [facility])  Result Date: 08/02/2018  Lower Venous Study Indications: Swelling, and Edema.  Performing Technologist: Abram Sander RVS  Examination Guidelines: A complete evaluation includes B-mode imaging, spectral Doppler, color Doppler, and power Doppler as needed of all accessible portions of each vessel. Bilateral testing is considered an integral part of a complete examination. Limited examinations for reoccurring indications may be performed as  noted.  Right Venous Findings: +---------+---------------+---------+-----------+----------+-------+          CompressibilityPhasicitySpontaneityPropertiesSummary +---------+---------------+---------+-----------+----------+-------+ CFV      Full           Yes      Yes                          +---------+---------------+---------+-----------+----------+-------+ SFJ      Full                                                 +---------+---------------+---------+-----------+----------+-------+ FV Prox  Full                                                 +---------+---------------+---------+-----------+----------+-------+ FV Mid   Full                                                 +---------+---------------+---------+-----------+----------+-------+ FV DistalFull                                                  +---------+---------------+---------+-----------+----------+-------+ PFV      Full                                                 +---------+---------------+---------+-----------+----------+-------+ POP      Full           Yes      Yes                          +---------+---------------+---------+-----------+----------+-------+ PTV      Full                                                 +---------+---------------+---------+-----------+----------+-------+ PERO     Full                                                 +---------+---------------+---------+-----------+----------+-------+  Left Venous Findings: +---------+---------------+---------+-----------+----------+-------+          CompressibilityPhasicitySpontaneityPropertiesSummary +---------+---------------+---------+-----------+----------+-------+ CFV      Full           Yes      Yes                          +---------+---------------+---------+-----------+----------+-------+ SFJ      Full                                                 +---------+---------------+---------+-----------+----------+-------+  FV Prox  Full                                                 +---------+---------------+---------+-----------+----------+-------+ FV Mid   Full                                                 +---------+---------------+---------+-----------+----------+-------+ FV DistalFull                                                 +---------+---------------+---------+-----------+----------+-------+ PFV      Full                                                 +---------+---------------+---------+-----------+----------+-------+ POP      Full           Yes      Yes                          +---------+---------------+---------+-----------+----------+-------+ PTV      Full                                                  +---------+---------------+---------+-----------+----------+-------+ PERO     Full                                                 +---------+---------------+---------+-----------+----------+-------+    Summary: Right: There is no evidence of deep vein thrombosis in the lower extremity. No cystic structure found in the popliteal fossa. Left: There is no evidence of deep vein thrombosis in the lower extremity. No cystic structure found in the popliteal fossa.  *See table(s) above for measurements and observations. Electronically signed by Harold Barban MD on 08/02/2018 at 3:05:07 PM.    Final    Scheduled Meds: . amLODipine  10 mg Oral Daily  . enoxaparin (LOVENOX) injection  95 mg Subcutaneous Q12H  . hydrochlorothiazide  25 mg Oral Daily  . metoprolol succinate  50 mg Oral Daily  . potassium chloride  40 mEq Oral BID  . sodium chloride flush  10-40 mL Intracatheter Q12H   Continuous Infusions: . ceFEPime (MAXIPIME) IV 1 g (08/02/18 0913)    LOS: 0 days   Kerney Elbe, DO [facility] PAGER is on [place]  If 7PM-7AM, please contact night-coverage [email or web address] Password [facility] 08/02/2018, 4:32 PM

## 2018-08-03 ENCOUNTER — Observation Stay (HOSPITAL_COMMUNITY): Payer: BLUE CROSS/BLUE SHIELD

## 2018-08-03 DIAGNOSIS — N1339 Other hydronephrosis: Secondary | ICD-10-CM | POA: Diagnosis not present

## 2018-08-03 DIAGNOSIS — R600 Localized edema: Secondary | ICD-10-CM | POA: Diagnosis not present

## 2018-08-03 DIAGNOSIS — I10 Essential (primary) hypertension: Secondary | ICD-10-CM | POA: Diagnosis not present

## 2018-08-03 DIAGNOSIS — N390 Urinary tract infection, site not specified: Secondary | ICD-10-CM | POA: Diagnosis not present

## 2018-08-03 DIAGNOSIS — E876 Hypokalemia: Secondary | ICD-10-CM | POA: Diagnosis not present

## 2018-08-03 LAB — CBC WITH DIFFERENTIAL/PLATELET
Abs Immature Granulocytes: 4.39 10*3/uL — ABNORMAL HIGH (ref 0.00–0.07)
BASOS ABS: 0.1 10*3/uL (ref 0.0–0.1)
Basophils Relative: 0 %
Eosinophils Absolute: 0 10*3/uL (ref 0.0–0.5)
Eosinophils Relative: 0 %
HCT: 26.1 % — ABNORMAL LOW (ref 39.0–52.0)
HEMOGLOBIN: 8.1 g/dL — AB (ref 13.0–17.0)
IMMATURE GRANULOCYTES: 10 %
LYMPHS ABS: 2.5 10*3/uL (ref 0.7–4.0)
LYMPHS PCT: 6 %
MCH: 32.9 pg (ref 26.0–34.0)
MCHC: 31 g/dL (ref 30.0–36.0)
MCV: 106.1 fL — ABNORMAL HIGH (ref 80.0–100.0)
Monocytes Absolute: 4.4 10*3/uL — ABNORMAL HIGH (ref 0.1–1.0)
Monocytes Relative: 11 %
NRBC: 3.5 % — AB (ref 0.0–0.2)
Neutro Abs: 30.9 10*3/uL — ABNORMAL HIGH (ref 1.7–7.7)
Neutrophils Relative %: 73 %
Platelets: 147 10*3/uL — ABNORMAL LOW (ref 150–400)
RBC: 2.46 MIL/uL — ABNORMAL LOW (ref 4.22–5.81)
RDW: 17.7 % — ABNORMAL HIGH (ref 11.5–15.5)
WBC: 42.4 10*3/uL — ABNORMAL HIGH (ref 4.0–10.5)

## 2018-08-03 LAB — COMPREHENSIVE METABOLIC PANEL
ALBUMIN: 2.8 g/dL — AB (ref 3.5–5.0)
ALK PHOS: 95 U/L (ref 38–126)
ALT: 19 U/L (ref 0–44)
ANION GAP: 7 (ref 5–15)
AST: 20 U/L (ref 15–41)
BUN: 12 mg/dL (ref 6–20)
CALCIUM: 8 mg/dL — AB (ref 8.9–10.3)
CHLORIDE: 106 mmol/L (ref 98–111)
CO2: 26 mmol/L (ref 22–32)
CREATININE: 1.37 mg/dL — AB (ref 0.61–1.24)
GFR calc Af Amer: >60 mL/min (ref >60)
GFR calc non Af Amer: 59 mL/min — ABNORMAL LOW (ref >60)
GLUCOSE: 116 mg/dL — AB (ref 70–99)
Potassium: 3.6 mmol/L (ref 3.5–5.1)
SODIUM: 139 mmol/L (ref 135–145)
Total Bilirubin: 0.7 mg/dL (ref 0.3–1.2)
Total Protein: 5.5 g/dL — ABNORMAL LOW (ref 6.5–8.1)

## 2018-08-03 LAB — MAGNESIUM: MAGNESIUM: 1.8 mg/dL (ref 1.7–2.4)

## 2018-08-03 LAB — PHOSPHORUS: Phosphorus: 3.7 mg/dL (ref 2.5–4.6)

## 2018-08-03 MED ORDER — VANCOMYCIN HCL 10 G IV SOLR
2000.0000 mg | INTRAVENOUS | Status: AC
  Administered 2018-08-03: 2000 mg via INTRAVENOUS
  Filled 2018-08-03: qty 2000

## 2018-08-03 MED ORDER — FUROSEMIDE 10 MG/ML IJ SOLN
40.0000 mg | Freq: Once | INTRAMUSCULAR | Status: AC
  Administered 2018-08-03: 40 mg via INTRAVENOUS
  Filled 2018-08-03: qty 4

## 2018-08-03 MED ORDER — VANCOMYCIN HCL 10 G IV SOLR: 1750.0000 mg | INTRAVENOUS | Status: DC

## 2018-08-03 MED ORDER — SODIUM CHLORIDE 0.9 % IV SOLN
INTRAVENOUS | Status: DC | PRN
  Administered 2018-08-03 – 2018-08-04 (×2): 1000 mL via INTRAVENOUS

## 2018-08-03 NOTE — Progress Notes (Signed)
PROGRESS NOTE    Dylan Gonzalez  KWI:[id number] DOB: [birth year] DOA: 08/01/2018 PCP: Donald Prose, MD   Brief Narrative:  HPI per Dr. Tennis Must on 08/01/18 Dylan Gonzalez is a 52 y.o. 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  He also states he had 4 episodes of diarrhea 2 days ago, but has since then resolved.  He recently had rhinorrhea and mild productive cough of whitish/clear phlegm with "some red tinge".  He also has had progressively worse bilateral lower extremities edema, more affected on the left for the past week and a half.  He denies headache, sore throat, dyspnea, wheezing, chest pain, palpitations, dizziness, diaphoresis, PND, or orthopnea.  He denies constipation, melena or hematochezia.  He has had some dysuria and frequency.  He denies polyuria, polydipsia, polyphagia or blurred vision.  **Admitted for bilateral lower extremity edema and acute UTI.  Work-up revealed no evidence of any acute DVT patient was transitioned back to his home Lovenox.  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 -Placed In Observation/Med Surge. -Given Lovenox by ED, however the patient is already on Xarelto and will change back to Home Xarelto. -Follow-up Doppler ultrasound showed no evidence of any DVT in the right or left lower extremities and this was read  by Dr. Harold Barban -Discussed the case with Dr. [name] of Oncology who recommends placing the patient back on his home Xarelto -Check echocardiogram to rule out heart failure as patient's BNP was elevated on admission and showed Grade 2 DD but do not feel he is in acute decompensated Heart Failure  -Chest x-ray showed no evidence of any vascular congestion and there is no acute cardiopulmonary disease -Patient denied any orthopnea but states that his legs are more swollen than usual and possibly secondary to hypoalbuminemia -Obtaining nutrition consult  Acute UTI -U/A showed cloudy urine, large hemoglobin, large leukocytes, positive nitrites, few bacteria, greater than 50 WBCs, and greater than 50 RBCs per high-power field -Urine Cx showed >100,000 Colonies of Staph Aureus -C/w IV Cefepime due to previous Urine Sensitivities and will add IV Vancomycin for Staph Aureus empirically  -Follow-up urine cultures and final sensitivity results. 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 -Follow-up Doppler ultrasound this a.m. revealed no acute DVT  Hypertension -Continue Amlodipine 10 mg p.o. daily. -Continue Metoprolol 50 mg p.o. daily. -Hold Hydrochlorothiazide 25 mg p.o. Daily given his renal function and hyperkalemia and starting IV Furosemide -Continue Monitor BP, HR, renal function electrolytes.  Prostate Neoplasm Primary Leiomyosarcoma of intra-abdominal site Malignant Neoplasm metastatic to lung [facility]) -He has had 3 cycles of Taxotere with Gemcitabine, which he was started on 05/30/2018. -Follow-up with oncology as scheduled. -Dr. [name] notified via epic as a courtesy -Check Renal US  CAD (  coronary artery disease) -On metoprolol and anticoagulation. -Checked ECHOCardiogram and showed Normal Systolic  Function of 40-98% and Diastolic Grade 2 CHF -C/w Medication as above   Hypokalemia -His potassium on admission was 3.2 and this morning was 3.6 -Replete with p.o. potassium chloride 40 mg twice daily x2 BID -Continue monitor replete as necessary -Repeat CMP in a.m.  Leukocytosis -Patient's WBC on admission was 44.6 now is 42.4 -In the setting of Neulasta injection a week ago -Patient is already on IV antibiotics with IV cefepime for urinary tract infection and will add IV Vancomycin for empiric Abx coverage -Continue to monitor for signs and symptoms of infection -Repeat CBC in the a.m.  Macrocytic Anemia -Has been having a elevated MCV and range from 104-106.2 recently -We will/hematocrit went from 9.2/29.5 and is now down to 8.1/26.1 -Likely in the setting of chemotherapy -Continue to monitor for signs and symptoms of bleeding -Repeat CBC in a.m.  Thrombocytopenia -Likely in the setting of antineoplastic drugs with chemotherapy -Patient platelet count admission was 126 now 147 -Continue to monitor for signs and symptoms of bleeding -Repeat CBC in a.m.  CKD Stage2/Renal Insufficiency -Patient baseline creatinine is around 1.1-1.2 -Slightly elevated on admission have held hydrochlorothiazide -Continue to monitor and repeat CMP in a.m. -Patient BUN/creatinine 7/1.26 to close to baseline but slightly bumped to 12/1.37 -Will need to be careful and watch patient's Cr now that IV Vancomycin will be empirically started  -Check Renal U/S -Repeat CMP in AM   Chronic Diastolic CHF  -BNP on admission was 330.4 -Echocardiogram done this admission showed a normal systolic function with grade 2 diastolic dysfunction -Strict I's and O's and Daily Weights, -Will diurese and give IV Lasix 40 mg this AM and 40 mg IV this evening and re-evaluate but do not feel clinically he is decompensated as he has no Cardiomegaly, Dyspnea on Exertion, Orthopnea, Pulmonary Vascular Congestion -LE could  be attributable to Hypoalbuminemia  -Patient is -280 mL  -Continue to monitor volume status very carefully  DVT prophylaxis: Anticoagulated with Xarelto  Code Status: FULL CODE Family Communication: No family present at bedside  Disposition Plan: Remain Inpatient for further work up and treatment   Consultants:   Medical Oncology Dr. [name] notified via EPIC   Procedures:  BILATERAL LE VENOUS DUPLEX Bilateral lower extremity venous duplex completed. Bilateral lower extremities are negative for deep vein thrombosis. There is no evidence of Baker's cyst bilaterally.  ECHOCARDIOGRAM ------------------------------------------------------------------- Study Conclusions  - Left ventricle: The cavity size was normal. Wall thickness was   increased in a pattern of mild LVH. Systolic function was normal.   The estimated ejection fraction was in the range of 50% to 55%.   Wall motion was normal; there were no regional wall motion   abnormalities. Features are consistent with a pseudonormal left   ventricular filling pattern, with concomitant abnormal relaxation   and increased filling pressure (grade 2 diastolic dysfunction). - Aortic valve: There was no regurgitation. - Mitral valve: There was no significant regurgitation. - Left atrium: The atrium was moderately dilated. - Right ventricle: Systolic function was normal. - Atrial septum: No defect or patent foramen ovale was identified. - Tricuspid valve: There was trivial regurgitation. - Pulmonic valve: There was no significant regurgitation. - Pulmonary arteries: Systolic pressure was within the normal   range.  Impressions:  - Normal LV systolic function with grade 2 diastolic dysfunction.   Antimicrobials:  Anti-infectives (From admission, onward)   Start     Dose/Rate Route  Frequency Ordered Stop   08/02/18 0200  ceFEPIme (MAXIPIME) 1 g in sodium chloride 0.9 % 100 mL IVPB     1 g 200 mL/hr over 30 Minutes  Intravenous Every 8 hours 08/02/18 0112     08/02/18 0030  cefTRIAXone (ROCEPHIN) 1 g in sodium chloride 0.9 % 100 mL IVPB  Status:  Discontinued     1 g 200 mL/hr over 30 Minutes Intravenous Daily at bedtime 08/02/18 0017 08/02/18 0112     Subjective: Seen and examined at bedside and states that his legs at a little better.  Find out that he has history of heart failure.  No chest pain, lightheadedness or dizziness but his pain quite a bit.  No other concerns or complaints but will change a Foley catheter and that was changed out 2 weeks ago due to suspected UTI.  Objective: Vitals:   08/02/18 0517 08/02/18 1417 08/02/18 1935 08/03/18 1436  BP: 126/83 111/76 123/81 128/84  Pulse: 96 (!) 101 (!) 106 (!) 106  Resp: 18  18 16   Temp: 98.7 F (37.1 C)  98.1 F (36.7 C) 99.3 F (37.4 C)  TempSrc: Oral  Oral Oral  SpO2: 98% 97% 100% 99%  Weight:      Height:        Intake/Output Summary (Last 24 hours) at 08/03/2018 1619 Last data filed at 08/03/2018 0930 Gross per 24 hour  Intake 360 ml  Output -  Net 360 ml   Filed Weights   08/01/18 1931  Weight: 97.1 kg   Examination: Physical Exam:  Constitutional: Well nourished, well-developed overweight African-American male currently no acute distress appears somewhat anxious Eyes: Lids and conjunctive are normal.  Sclera anicteric ENMT: External ears and nose appear normal.  Grossly normal hearing Neck: Appears supple no JVD Respiratory: Slightly diminished to auscultation bilaterally with no appreciable wheezing, rales, rhonchi.  Patient not tachypneic wheezing excess muscle breathe Cardiovascular: Regular rate and rhythm.  No appreciable murmurs, rubs or gallops.  Has 1+ to 2+ lower extremity edema with the left leg being worse than the right Abdomen: Soft, non-tender, mildly distended.  Bowel sounds present x4 GU: Deferred but wears a chronic catheter Musculoskeletal: No appreciable clubbing or cyanosis.  No joint deformities  upper lower extremities Skin: No appreciable rashes or lesions limited skin evaluation Neurologic: Cranial nerves II through XII grossly intact no appreciable focal deficits but does have some lid drooping left eye Psychiatric: Slightly anxious mood but normal affect.  Intact judgment insight  Data Reviewed: I have personally reviewed following labs and imaging studies  CBC: Recent Labs  Lab 08/01/18 2111 08/02/18 0930 08/03/18 0235  WBC 44.6* 40.5* 42.4*  NEUTROABS  --  27.8* 30.9*  HGB 9.2* 8.6* 8.1*  HCT 29.5* 27.6* 26.1*  MCV 104.2* 106.2* 106.1*  PLT 126* 129* 923*   Basic Metabolic Panel: Recent Labs  Lab 08/01/18 2111 08/01/18 2309 08/02/18 0930 08/03/18 0235  NA 141  --  140 139  K 3.2*  --  3.4* 3.6  CL 104  --  106 106  CO2 28  --  28 26  GLUCOSE 105*  --  155* 116*  BUN 7  --  7 12  CREATININE 1.33*  --  1.26* 1.37*  CALCIUM 8.5*  --  8.0* 8.0*  MG  --  1.8 2.0 1.8  PHOS  --   --  3.2 3.7   GFR: Estimated Creatinine Clearance: 74.9 mL/min (A) (by C-G formula based on SCr of 1.37  mg/dL (H)). Liver Function Tests: Recent Labs  Lab 08/01/18 2111 08/02/18 0930 08/03/18 0235  AST 27 24 20   ALT 21 18 19   ALKPHOS 113 96 95  BILITOT 0.7 0.5 0.7  PROT 6.3* 5.4* 5.5*  ALBUMIN 3.0* 2.7* 2.8*   No results for input(s): LIPASE, AMYLASE in the last 168 hours. No results for input(s): AMMONIA in the last 168 hours. Coagulation Profile: No results for input(s): INR, PROTIME in the last 168 hours. Cardiac Enzymes: No results for input(s): CKTOTAL, CKMB, CKMBINDEX, TROPONINI in the last 168 hours. BNP (last 3 results) No results for input(s): PROBNP in the last 8760 hours. HbA1C: No results for input(s): HGBA1C in the last 72 hours. CBG: No results for input(s): GLUCAP in the last 168 hours. Lipid Profile: No results for input(s): CHOL, HDL, LDLCALC, TRIG, CHOLHDL, LDLDIRECT in the last 72 hours. Thyroid Function Tests: No results for input(s): TSH,  T4TOTAL, FREET4, T3FREE, THYROIDAB in the last 72 hours. Anemia Panel: No results for input(s): VITAMINB12, FOLATE, FERRITIN, TIBC, IRON, RETICCTPCT in the last 72 hours. Sepsis Labs: No results for input(s): PROCALCITON, LATICACIDVEN in the last 168 hours.  Recent Results (from the past 240 hour(s))  Urine Culture     Status: Abnormal (Preliminary result)   Collection Time: 08/01/18  9:11 PM  Result Value Ref Range Status   Specimen Description   Final    URINE, CLEAN CATCH Performed at [facility], [place] [address] [place], [place] [address]    Special Requests   Final    NONE Performed at [facility], [place] [address], [place], [place] [address]    Culture >=100,000 COLONIES/mL STAPHYLOCOCCUS AUREUS (A)  Final   Report Status PENDING  Incomplete  Blood culture (routine x 2)     Status: None (Preliminary result)   Collection Time: 08/01/18 11:05 PM  Result Value Ref Range Status   Specimen Description   Final    BLOOD LEFT ANTECUBITAL Performed at [facility] [address], [place], [place] [address]    Special Requests   Final    BOTTLES DRAWN AEROBIC AND ANAEROBIC Blood Culture results may not be optimal due to an excessive volume of blood received in culture bottles Performed at [place] [address] [place], [place] [address]    Culture   Final    NO GROWTH 1 DAY Performed at [facility], [place] [address], [place], [place] [address]    Report Status PENDING  Incomplete  Blood culture (routine x 2)     Status: None (Preliminary result)   Collection Time: 08/01/18 11:05 PM  Result Value Ref Range Status   Specimen Description   Final    BLOOD RIGHT ANTECUBITAL Performed at [place] [address], [place], [place] [address]    Special Requests   Final    BOTTLES DRAWN AEROBIC AND ANAEROBIC Blood Culture adequate volume Performed at [place] [address] [place], [place] [address]    Culture   Final    NO GROWTH 1 DAY Performed at [facility], [place] [address], [place], [place] [address]    Report Status PENDING  Incomplete    Radiology Studies: Dg Chest 2 View  Result Date: 08/01/2018 CLINICAL DATA:  52 y/o M; receiving treatment for prostate and bladder cancer. Fever and nausea. EXAM: CHEST - 2 VIEW COMPARISON:  06/29/2018 chest radiograph. FINDINGS: Stable normal cardiac silhouette  given projection and technique. Stable port catheter tip projecting over upper SVC. Aortic atherosclerosis with calcification. Clear lungs. No pleural effusion or pneumothorax. No acute osseous abnormality is evident. IMPRESSION: No active cardiopulmonary disease. Electronically Signed   By: Kristine Garbe M.D.   On: 08/01/2018 20:31   Vas [place] Lower Extremity Venous (dvt) (only [facility])  Result Date: 08/02/2018  Lower Venous Study Indications: Swelling, and Edema.  Performing Technologist: Abram Sander RVS  Examination Guidelines: A complete evaluation includes B-mode imaging, spectral Doppler, color Doppler, and power Doppler as needed of all accessible portions of each vessel. Bilateral testing is considered an integral part of a complete examination. Limited examinations for reoccurring indications may be performed as noted.  Right Venous Findings: +---------+---------------+---------+-----------+----------+-------+          CompressibilityPhasicitySpontaneityPropertiesSummary +---------+---------------+---------+-----------+----------+-------+ CFV      Full           Yes      Yes                          +---------+---------------+---------+-----------+----------+-------+ SFJ      Full                                                 +---------+---------------+---------+-----------+----------+-------+ FV Prox  Full                                                  +---------+---------------+---------+-----------+----------+-------+ FV Mid   Full                                                 +---------+---------------+---------+-----------+----------+-------+ FV DistalFull                                                 +---------+---------------+---------+-----------+----------+-------+ PFV      Full                                                 +---------+---------------+---------+-----------+----------+-------+ POP      Full           Yes      Yes                          +---------+---------------+---------+-----------+----------+-------+ PTV      Full                                                 +---------+---------------+---------+-----------+----------+-------+ PERO     Full                                                 +---------+---------------+---------+-----------+----------+-------+  Left Venous Findings: +---------+---------------+---------+-----------+----------+-------+          CompressibilityPhasicitySpontaneityPropertiesSummary +---------+---------------+---------+-----------+----------+-------+ CFV      Full           Yes      Yes                          +---------+---------------+---------+-----------+----------+-------+ SFJ      Full                                                 +---------+---------------+---------+-----------+----------+-------+ FV Prox  Full                                                 +---------+---------------+---------+-----------+----------+-------+ FV Mid   Full                                                 +---------+---------------+---------+-----------+----------+-------+ FV DistalFull                                                 +---------+---------------+---------+-----------+----------+-------+ PFV      Full                                                  +---------+---------------+---------+-----------+----------+-------+ POP      Full           Yes      Yes                          +---------+---------------+---------+-----------+----------+-------+ PTV      Full                                                 +---------+---------------+---------+-----------+----------+-------+ PERO     Full                                                 +---------+---------------+---------+-----------+----------+-------+    Summary: Right: There is no evidence of deep vein thrombosis in the lower extremity. No cystic structure found in the popliteal fossa. Left: There is no evidence of deep vein thrombosis in the lower extremity. No cystic structure found in the popliteal fossa.  *See table(s) above for measurements and observations. Electronically signed by Harold Barban MD on 08/02/2018 at 3:05:07 PM.    Final    Scheduled Meds: . amLODipine  10 mg Oral Daily  . metoprolol succinate  50 mg Oral Daily  . potassium chloride  40 mEq Oral BID  . rivaroxaban  20 mg Oral Q supper  . sodium chloride flush  10-40 mL Intracatheter Q12H   Continuous Infusions: . ceFEPime (MAXIPIME) IV 1 g (08/03/18 0903)    LOS: 0 days   Kerney Elbe, DO [facility] PAGER is on [place]  If 7PM-7AM, please contact night-coverage [email or web address] Password [facility] 08/03/2018, 4:19 PM

## 2018-08-03 NOTE — Progress Notes (Signed)
Pharmacy Antibiotic Note  Dylan Gonzalez is a 52 y.o. male admitted on 08/01/2018 with UTI.  Cefepime per MD dosing started 11/27.  Today urine culture shows + S. Aureus; Pharmacy has been consulted for Vancomycin dosing.  Plan:  Cefepime per MD  Vancomycin 2gm IV x 1 dose followed by 1750mg  IV q24h (Goal AUC 400-500)  Follow final sensitivities of urine and blood cultures  Follow renal function  Height: 5\' 11"  (180.3 cm) Weight: 214 lb (97.1 kg) IBW/kg (Calculated) : 75.3  Temp (24hrs), Avg:98.7 F (37.1 C), Min:98.1 F (36.7 C), Max:99.3 F (37.4 C)  Recent Labs  Lab 08/01/18 2111 08/02/18 0930 08/03/18 0235  WBC 44.6* 40.5* 42.4*  CREATININE 1.33* 1.26* 1.37*    Estimated Creatinine Clearance: 74.9 mL/min (A) (by C-G formula based on SCr of 1.37 mg/dL (H)).    Allergies  Allergen Reactions  . Pollen Extract Other (See Comments)    Sneezing and shortness of breath    Antimicrobials this admission: 11/27 Cefepime >>   11/28 Vanc >>    Dose adjustments this admission:    Microbiology results: 11/26 BCx: sent 11/26 UCx: S. Aureus   Thank you for allowing pharmacy to be a part of this patient's care.  Everette Rank, PharmD 08/03/2018 4:50 PM

## 2018-08-04 ENCOUNTER — Observation Stay (HOSPITAL_COMMUNITY): Payer: BLUE CROSS/BLUE SHIELD

## 2018-08-04 DIAGNOSIS — R600 Localized edema: Secondary | ICD-10-CM | POA: Diagnosis not present

## 2018-08-04 DIAGNOSIS — I10 Essential (primary) hypertension: Secondary | ICD-10-CM | POA: Diagnosis not present

## 2018-08-04 DIAGNOSIS — N390 Urinary tract infection, site not specified: Secondary | ICD-10-CM | POA: Diagnosis not present

## 2018-08-04 DIAGNOSIS — R509 Fever, unspecified: Secondary | ICD-10-CM | POA: Diagnosis not present

## 2018-08-04 DIAGNOSIS — E876 Hypokalemia: Secondary | ICD-10-CM | POA: Diagnosis not present

## 2018-08-04 LAB — URINE CULTURE: Culture: >=100000 — AB

## 2018-08-04 LAB — CBC WITH DIFFERENTIAL/PLATELET
Abs Immature Granulocytes: 2.25 10*3/uL — ABNORMAL HIGH (ref 0.00–0.07)
BASOS ABS: 0.2 10*3/uL — AB (ref 0.0–0.1)
BASOS PCT: 0 %
Eosinophils Absolute: 0 10*3/uL (ref 0.0–0.5)
Eosinophils Relative: 0 %
HCT: 29.5 % — ABNORMAL LOW (ref 39.0–52.0)
HEMOGLOBIN: 9.2 g/dL — AB (ref 13.0–17.0)
IMMATURE GRANULOCYTES: 6 %
LYMPHS PCT: 7 %
Lymphs Abs: 2.5 10*3/uL (ref 0.7–4.0)
MCH: 32.3 pg (ref 26.0–34.0)
MCHC: 31.2 g/dL (ref 30.0–36.0)
MCV: 103.5 fL — AB (ref 80.0–100.0)
Monocytes Absolute: 4 10*3/uL — ABNORMAL HIGH (ref 0.1–1.0)
Monocytes Relative: 11 %
NEUTROS ABS: 26.8 10*3/uL — AB (ref 1.7–7.7)
NRBC: 2.2 % — AB (ref 0.0–0.2)
Neutrophils Relative %: 76 %
PLATELETS: 223 10*3/uL (ref 150–400)
RBC: 2.85 MIL/uL — AB (ref 4.22–5.81)
RDW: 17.3 % — ABNORMAL HIGH (ref 11.5–15.5)
WBC: 35.7 10*3/uL — AB (ref 4.0–10.5)

## 2018-08-04 LAB — COMPREHENSIVE METABOLIC PANEL
ALK PHOS: 101 U/L (ref 38–126)
ALT: 26 U/L (ref 0–44)
ANION GAP: 8 (ref 5–15)
AST: 24 U/L (ref 15–41)
Albumin: 3 g/dL — ABNORMAL LOW (ref 3.5–5.0)
BUN: 13 mg/dL (ref 6–20)
CALCIUM: 8.4 mg/dL — AB (ref 8.9–10.3)
CO2: 28 mmol/L (ref 22–32)
CREATININE: 1.47 mg/dL — AB (ref 0.61–1.24)
Chloride: 104 mmol/L (ref 98–111)
GFR, EST NON AFRICAN AMERICAN: 54 mL/min — AB (ref >60)
Glucose, Bld: 129 mg/dL — ABNORMAL HIGH (ref 70–99)
Potassium: 4 mmol/L (ref 3.5–5.1)
SODIUM: 140 mmol/L (ref 135–145)
Total Bilirubin: 0.8 mg/dL (ref 0.3–1.2)
Total Protein: 6.3 g/dL — ABNORMAL LOW (ref 6.5–8.1)

## 2018-08-04 LAB — MAGNESIUM: Magnesium: 1.7 mg/dL (ref 1.7–2.4)

## 2018-08-04 LAB — PHOSPHORUS: PHOSPHORUS: 4.3 mg/dL (ref 2.5–4.6)

## 2018-08-04 MED ORDER — SENNOSIDES-DOCUSATE SODIUM 8.6-50 MG PO TABS
1.0000 | ORAL_TABLET | Freq: Two times a day (BID) | ORAL | Status: DC
  Administered 2018-08-04 – 2018-08-05 (×3): 1 via ORAL
  Filled 2018-08-04 (×3): qty 1

## 2018-08-04 MED ORDER — FUROSEMIDE 10 MG/ML IJ SOLN
40.0000 mg | Freq: Once | INTRAMUSCULAR | Status: AC
  Administered 2018-08-04: 40 mg via INTRAVENOUS
  Filled 2018-08-04: qty 4

## 2018-08-04 MED ORDER — POLYETHYLENE GLYCOL 3350 17 G PO PACK
17.0000 g | PACK | Freq: Two times a day (BID) | ORAL | Status: DC
  Administered 2018-08-04 – 2018-08-05 (×2): 17 g via ORAL
  Filled 2018-08-04 (×2): qty 1

## 2018-08-04 MED ORDER — AMOXICILLIN 500 MG PO CAPS
500.0000 mg | ORAL_CAPSULE | Freq: Three times a day (TID) | ORAL | Status: DC
  Administered 2018-08-04 – 2018-08-05 (×5): 500 mg via ORAL
  Filled 2018-08-04 (×5): qty 1

## 2018-08-04 MED ORDER — BISACODYL 10 MG RE SUPP: 10.0000 mg | Freq: Every day | RECTAL | Status: DC | PRN

## 2018-08-04 NOTE — Progress Notes (Signed)
PROGRESS NOTE    Dylan Gonzalez  QQP:[id number] DOB: April 06, 1966 DOA: 08/01/2018 PCP: Donald Prose, MD   Brief Narrative:  HPI per Dr. Tennis Must on 08/01/18 Dylan Gonzalez is a 52 y.o. 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  He also states he had 4 episodes of diarrhea 2 days ago, but has since then resolved.  He recently had rhinorrhea and mild productive cough of whitish/clear phlegm with "some red tinge".  He also has had progressively worse bilateral lower extremities edema, more affected on the left for the past week and a half.  He denies headache, sore throat, dyspnea, wheezing, chest pain, palpitations, dizziness, diaphoresis, PND, or orthopnea.  He denies constipation, melena or hematochezia.  He has had some dysuria and frequency.  He denies polyuria, polydipsia, polyphagia or blurred vision.  **Admitted for bilateral lower extremity edema and acute UTI.  Work-up revealed no evidence of any acute DVT patient was transitioned back to his home Lovenox.  Cup revealed he had positive staph aureus in the urine and antibiotics were narrowed to amoxicillin p.o. and IV vancomycin and IV cefepime were discontinued.  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 -Placed In Observation/Med Surge. -Given Lovenox by ED, however the patient  is already on Xarelto and will change back to Home Xarelto. -Follow-up Doppler ultrasound showed no evidence of any DVT in the right or left lower extremities and this was read by Dr. Harold Barban -Discussed the case with Dr. [name] of Oncology who recommends placing the patient back on his home Xarelto -Check echocardiogram to rule out heart failure as patient's BNP was elevated on admission and showed Grade 2 DD but do not feel he is in acute decompensated Heart Failure but still provided the patient with Lasix -Chest x-ray showed no evidence of any vascular congestion and there is no acute cardiopulmonary disease -Patient denied any orthopnea but states that his legs are more swollen than usual and possibly secondary to hypoalbuminemia -Obtaining nutrition consult and they have increased and recommending Ensure supplements -Ordered TED hose  Acute staph aureus UTI -U/A showed cloudy urine, large hemoglobin, large leukocytes, positive nitrites, few bacteria, greater than 50 WBCs, and greater than 50 RBCs per high-power field -Urine Cx showed >100,000 Colonies of Staph Aureus -C/w IV Cefepime due to previous Urine Sensitivities and will add IV Vancomycin for Staph Aureus empirically but now is narrowed given sensitivity results and is placed on p.o. amoxicillin 500 mg 3 times daily -Follow-up urine cultures and final sensitivity results. -Consider urological evaluation if not improving; I discussed the case with urology Dr. Franchot Gallo who recommends continuing antibiotics at this time -Measure PVRs -Cultures x2 showed no growth to date at 2 days -Discussed with the wife and the patient has been off balance for a little while now so I have consulted PT OT for further evaluation  recommendations  History of Deep vein thrombosis (DVT) of left lower extremity (HCC) -Continue Anticoagulation. -Follow-up Doppler ultrasound this a.m. revealed no acute DVT  Hypertension -Continue  Amlodipine 10 mg p.o. daily. -Continue Metoprolol 50 mg p.o. daily. -Hold Hydrochlorothiazide 25 mg p.o. Daily given his renal function and hyperkalemia and starting IV Furosemide; dose of IV furosemide today -Continue Monitor BP, HR, renal function electrolytes.  Prostate Neoplasm Primary Leiomyosarcoma of intra-abdominal site Malignant Neoplasm metastatic to lung [facility]) -He has had 3 cycles of Taxotere with Gemcitabine, which he was started on 05/30/2018. -Follow-up with oncology as scheduled. -Dr. [name] notified via epic as a courtesy -Check Renal US showed some mild left hydronephrosis.  I discussed the case with Dr. Franchot Gallo who states this is chronic and of no concern.  Hypoechoic lesion noted with the tail of the stent  CAD (coronary artery disease) -On metoprolol and anticoagulation. -Checked ECHOCardiogram and showed Normal Systolic Function of 88-41% and Diastolic Grade 2 CHF -C/w Medication as above   Hypokalemia -His potassium on admission was 3.2 and this morning was 4.0 after repletion -Continue monitor replete as necessary -Repeat CMP in a.m.  Leukocytosis -Patient's WBC on admission was 44.6 now is 35.7 -In the setting of Neulasta injection a week ago -Patient is already on IV antibiotics with IV cefepime for urinary tract infection and will add IV Vancomycin for empiric Abx coverage but now had no therapy to p.o. amoxicillin -Continue to monitor for signs and symptoms of infection -Repeat CBC in the a.m.  Macrocytic Anemia -Has been having a elevated MCV and range from 104-106.2 recently -We will/hematocrit went from 9.2/29.5 and is now down to 8.1/26.1 and now it back up to 9.2/29.5 -Likely in the setting of chemotherapy -Continue to monitor for signs and symptoms of bleeding -Repeat CBC in a.m.  Thrombocytopenia improved -Likely in the setting of antineoplastic drugs with chemotherapy -Patient platelet count admission was 126 now  223 -Continue to monitor for signs and symptoms of bleeding -Repeat CBC in a.m.  AKI on CKD Stage2/Renal Insufficiency -Patient baseline creatinine is around 1.1-1.2 -Slightly elevated on admission have held hydrochlorothiazide -Continue to monitor and repeat CMP in a.m. -Patient BUN/creatinine 7/1.26 to close to baseline but slightly bumped to 13/1.47 in the setting of diuresis as well as IV vancomycin -Will need to be careful and watch patient's Cr now that IV Vancomycin will be empirically started.  The vancomycin has now been stopped -Check Renal U/S and showed mild left-sided hydronephrosis which was new compared to thousand 19 and hyperechoic structure dilated left renal pelvis.  This is the ~stent after discussion with Dr. Diona Fanti.  The bladder is poorly assessed as it is decompressed with a Foley -Repeat CMP in AM   Chronic Diastolic CHF  -BNP on admission was 330.4 -Echocardiogram done this admission showed a normal systolic function with grade 2 diastolic dysfunction -Strict I's and O's and Daily Weights, -Given IV Lasix 40 mg twice daily yesterday and will try IV 40 mill grams today clinically he is not decompensated as he has no Cardiomegaly, Dyspnea on Exertion, Orthopnea, Pulmonary Vascular Congestion but feel that he is volume overloaded -LE IMA could be attributable to Hypoalbuminemia  -Patient is - 3.2449 L this admission -Continue to monitor volume status very carefully -Weights are not currently done and his last weight was 214 pounds  DVT prophylaxis: Anticoagulated with Xarelto  Code Status: FULL CODE Family Communication: No family present at bedside  Disposition Plan: Remain Inpatient for further  work up and treatment and anticipate PT/OT recommendations for further disposition  Consultants:   Medical Oncology Dr. [name] notified via EPIC   Procedures:  BILATERAL LE VENOUS DUPLEX Bilateral lower extremity venous duplex completed. Bilateral lower  extremities are negative for deep vein thrombosis. There is no evidence of Baker's cyst bilaterally.  ECHOCARDIOGRAM ------------------------------------------------------------------- Study Conclusions  - Left ventricle: The cavity size was normal. Wall thickness was   increased in a pattern of mild LVH. Systolic function was normal.   The estimated ejection fraction was in the range of 50% to 55%.   Wall motion was normal; there were no regional wall motion   abnormalities. Features are consistent with a pseudonormal left   ventricular filling pattern, with concomitant abnormal relaxation   and increased filling pressure (grade 2 diastolic dysfunction). - Aortic valve: There was no regurgitation. - Mitral valve: There was no significant regurgitation. - Left atrium: The atrium was moderately dilated. - Right ventricle: Systolic function was normal. - Atrial septum: No defect or patent foramen ovale was identified. - Tricuspid valve: There was trivial regurgitation. - Pulmonic valve: There was no significant regurgitation. - Pulmonary arteries: Systolic pressure was within the normal   range.  Impressions:  - Normal LV systolic function with grade 2 diastolic dysfunction.   Antimicrobials:  Anti-infectives (From admission, onward)   Start     Dose/Rate Route Frequency Ordered Stop   08/04/18 1700  vancomycin (VANCOCIN) 1,750 mg in sodium chloride 0.9 % 500 mL IVPB  Status:  Discontinued     1,750 mg 250 mL/hr over 120 Minutes Intravenous Every 24 hours 08/03/18 1659 08/04/18 0845   08/04/18 1000  amoxicillin (AMOXIL) capsule 500 mg     500 mg Oral 3 times daily 08/04/18 0845     08/03/18 1700  vancomycin (VANCOCIN) 2,000 mg in sodium chloride 0.9 % 500 mL IVPB     2,000 mg 250 mL/hr over 120 Minutes Intravenous STAT 08/03/18 1649 08/03/18 1857   08/02/18 0200  ceFEPIme (MAXIPIME) 1 g in sodium chloride 0.9 % 100 mL IVPB  Status:  Discontinued     1 g 200 mL/hr over 30  Minutes Intravenous Every 8 hours 08/02/18 0112 08/04/18 0845   08/02/18 0030  cefTRIAXone (ROCEPHIN) 1 g in sodium chloride 0.9 % 100 mL IVPB  Status:  Discontinued     1 g 200 mL/hr over 30 Minutes Intravenous Daily at bedtime 08/02/18 0017 08/02/18 0112     Subjective: Seen and examined at bedside and he thinks his leg looks a little bit less swollen.  States he has been urinating quite frequently.  No chest pain, lightheadedness or dizziness.  Wife states that he has been feeling dizzy for a little while and off balance and is concerned about him falling as he is fallen multiple times per nearly fallen.  PT OT has been consulted along with nutrition.  We will give another dose of IV Lasix given his volume overload..  Objective: Vitals:   08/03/18 2002 08/04/18 0552 08/04/18 0932 08/04/18 1342  BP: 112/75 114/75 111/84 119/73  Pulse: (!) 102 (!) 101 (!) 107 (!) 105  Resp: 20 20  14   Temp: 98.8 F (37.1 C) 98.1 F (36.7 C) 98.6 F (37 C) 99.3 F (37.4 C)  TempSrc: Oral Oral Oral Oral  SpO2: 99% 96% 98% 100%  Weight:      Height:        Intake/Output Summary (Last 24 hours) at 08/04/2018 1745 Last data filed  at 08/04/2018 1419 Gross per 24 hour  Intake 1035.13 ml  Output 4000 ml  Net -2964.87 ml   Filed Weights   08/01/18 1931  Weight: 97.1 kg   Examination: Physical Exam:  Constitutional: Well-nourished, well-developed overweight African-American male currently no acute distress appears somewhat anxious Eyes: Lids and conjunctive are normal.  Sclera anicteric ENMT: External ears and nose appear normal.  Grossly normal hearing Neck: Appears supple with no JVD Respiratory: Diminished to auscultation bilaterally no appreciable wheezing, rales, rhonchi.  Patient was not tachypneic or using any accessory muscles to breathe Cardiovascular: Regular rate and rhythm.  No appreciable murmurs, rubs, gallops.  Has 1-2+ lower extremity edema with the left being worse than the right  and does have some mild anasarca Abdomen: Soft, nontender, distended slightly.  Bowel sounds present GU: Deferred but wears a chronic catheter Musculoskeletal: No appreciable clubbing or cyanosis.  No joint deformities Skin: Skin is warm and dry no appreciable rashes or lesions limited skin evaluation Neurologic: Cranial nerves II through XII grossly intact no appreciable focal deficits but does have some lid drooping on the left Psychiatric: Anxious mood but normal affect.  Intact judgment insight.  Data Reviewed: I have personally reviewed following labs and imaging studies  CBC: Recent Labs  Lab 08/01/18 2111 08/02/18 0930 08/03/18 0235 08/04/18 0537  WBC 44.6* 40.5* 42.4* 35.7*  NEUTROABS  --  27.8* 30.9* 26.8*  HGB 9.2* 8.6* 8.1* 9.2*  HCT 29.5* 27.6* 26.1* 29.5*  MCV 104.2* 106.2* 106.1* 103.5*  PLT 126* 129* 147* 811   Basic Metabolic Panel: Recent Labs  Lab 08/01/18 2111 08/01/18 2309 08/02/18 0930 08/03/18 0235 08/04/18 0537  NA 141  --  140 139 140  K 3.2*  --  3.4* 3.6 4.0  CL 104  --  106 106 104  CO2 28  --  28 26 28   GLUCOSE 105*  --  155* 116* 129*  BUN 7  --  7 12 13   CREATININE 1.33*  --  1.26* 1.37* 1.47*  CALCIUM 8.5*  --  8.0* 8.0* 8.4*  MG  --  1.8 2.0 1.8 1.7  PHOS  --   --  3.2 3.7 4.3   GFR: Estimated Creatinine Clearance: 69.8 mL/min (A) (by C-G formula based on SCr of 1.47 mg/dL (H)). Liver Function Tests: Recent Labs  Lab 08/01/18 2111 08/02/18 0930 08/03/18 0235 08/04/18 0537  AST 27 24 20 24   ALT 21 18 19 26   ALKPHOS 113 96 95 101  BILITOT 0.7 0.5 0.7 0.8  PROT 6.3* 5.4* 5.5* 6.3*  ALBUMIN 3.0* 2.7* 2.8* 3.0*   No results for input(s): LIPASE, AMYLASE in the last 168 hours. No results for input(s): AMMONIA in the last 168 hours. Coagulation Profile: No results for input(s): INR, PROTIME in the last 168 hours. Cardiac Enzymes: No results for input(s): CKTOTAL, CKMB, CKMBINDEX, TROPONINI in the last 168 hours. BNP (last 3  results) No results for input(s): PROBNP in the last 8760 hours. HbA1C: No results for input(s): HGBA1C in the last 72 hours. CBG: No results for input(s): GLUCAP in the last 168 hours. Lipid Profile: No results for input(s): CHOL, HDL, LDLCALC, TRIG, CHOLHDL, LDLDIRECT in the last 72 hours. Thyroid Function Tests: No results for input(s): TSH, T4TOTAL, FREET4, T3FREE, THYROIDAB in the last 72 hours. Anemia Panel: No results for input(s): VITAMINB12, FOLATE, FERRITIN, TIBC, IRON, RETICCTPCT in the last 72 hours. Sepsis Labs: No results for input(s): PROCALCITON, LATICACIDVEN in the last 168 hours.  Recent  Results (from the past 240 hour(s))  Urine Culture     Status: Abnormal   Collection Time: 08/01/18  9:11 PM  Result Value Ref Range Status   Specimen Description   Final    URINE, CLEAN CATCH Performed at [facility], [place] [address] [place], [place] [address]    Special Requests   Final    NONE Performed at [facility], [place] [address], [place], [place] [address]    Culture >=100,000 COLONIES/mL STAPHYLOCOCCUS AUREUS (A)  Final   Report Status 08/04/2018 FINAL  Final   Organism ID, Bacteria STAPHYLOCOCCUS AUREUS (A)  Final      Susceptibility   Staphylococcus aureus - MIC*    CIPROFLOXACIN <=0.5 SENSITIVE Sensitive     GENTAMICIN <=0.5 SENSITIVE Sensitive     NITROFURANTOIN 32 SENSITIVE Sensitive     OXACILLIN 0.5 SENSITIVE Sensitive     TETRACYCLINE <=1 SENSITIVE Sensitive     VANCOMYCIN <=0.5 SENSITIVE Sensitive     TRIMETH/SULFA <=10 SENSITIVE Sensitive     CLINDAMYCIN <=0.25 SENSITIVE Sensitive     RIFAMPIN <=0.5 SENSITIVE Sensitive     Inducible Clindamycin NEGATIVE Sensitive     * >=100,000 COLONIES/mL STAPHYLOCOCCUS AUREUS  Blood culture (routine x 2)     Status: None (Preliminary result)   Collection Time: 08/01/18 11:05 PM  Result Value Ref Range Status   Specimen Description   Final    BLOOD LEFT ANTECUBITAL Performed  at [facility] [address], [place], [place] [address]    Special Requests   Final    BOTTLES DRAWN AEROBIC AND ANAEROBIC Blood Culture results may not be optimal due to an excessive volume of blood received in culture bottles Performed at [facility] [address], [place], [place] [address]    Culture   Final    NO GROWTH 2 DAYS Performed at [facility] [address], [place], [place] [address]    Report Status PENDING  Incomplete  Blood culture (routine x 2)     Status: None (Preliminary result)   Collection Time: 08/01/18 11:05 PM  Result Value Ref Range Status   Specimen Description   Final    BLOOD RIGHT ANTECUBITAL Performed at [facility] [address] [place], [place] [address]    Special Requests   Final    BOTTLES DRAWN AEROBIC AND ANAEROBIC Blood Culture adequate volume Performed at [facility] [address], [place], [place] [address]    Culture   Final    NO GROWTH 2 DAYS Performed at [facility] [address] [place], [place] [address]    Report Status PENDING  Incomplete    Radiology Studies: US Renal  Result Date: 08/03/2018 CLINICAL DATA:  Prostate leiomyosarcoma.  Acute renal insufficiency. EXAM: RENAL / URINARY TRACT ULTRASOUND COMPLETE COMPARISON:  CT scan May 22, 2018 FINDINGS: Right Kidney: Renal measurements: 11.6 x 4.4 x 5.2 cm = volume: 139.6 mL. The extrarenal pelvis is mildly prominent without hydronephrosis. Left Kidney: Renal measurements: 11.2 x 5.6 x 5.8 cm = volume: 190.7 mL. There is mild left-sided hydronephrosis. A hyperechoic structure seen in the left renal pelvis. Bladder: There is a pelvic mass measuring 13.8 x 9.5 x 9.3 cm, consistent with the patient's known malignancy. The bladder is decompressed with a Foley catheter and is displaced anteriorly by the pelvic mass. IMPRESSION: 1. There is mild left-sided hydronephrosis which is  new compared to September  2019. 2. There is a hyperechoic structure in the dilated left renal pelvis. The patient had a ureteral stent extending from the left renal pelvis to the bladder on the September 2019 CT. Is it possible that the hyperechoic structure in the left renal pelvis is part of the patient's stent? Recommend clinical correlation. A stone is not excluded on this study. However, there were no left-sided stones on the comparison CT scan just over 2 months ago. 3. Large left pelvic mass/known malignancy. 4. The bladder is poorly assessed as it is decompressed with a Foley catheter and displaced anteriorly by the pelvic mass. Electronically Signed   By: Dorise Bullion III M.D   On: 08/03/2018 16:56   Dg Chest Port 1 View  Result Date: 08/04/2018 CLINICAL DATA:  Fever, leg swelling. EXAM: PORTABLE CHEST 1 VIEW COMPARISON:  Frontal and lateral views 08/01/2018. chest CT 01/19/2018 FINDINGS: Low lung volumes. Right chest port with tip in the mid SVC.The cardiomediastinal contours are normal. Subcentimeter pulmonary nodules on prior CT are not well seen radiographically. Pulmonary vasculature is normal. No consolidation, pleural effusion, or pneumothorax. No acute osseous abnormalities are seen. IMPRESSION: Low lung volumes without acute chest findings. Electronically Signed   By: Keith Rake M.D.   On: 08/04/2018 05:59   Scheduled Meds: . amLODipine  10 mg Oral Daily  . amoxicillin  500 mg Oral TID  . metoprolol succinate  50 mg Oral Daily  . polyethylene glycol  17 g Oral BID  . potassium chloride  40 mEq Oral BID  . rivaroxaban  20 mg Oral Q supper  . senna-docusate  1 tablet Oral BID  . sodium chloride flush  10-40 mL Intracatheter Q12H   Continuous Infusions: . sodium chloride 10 mL/hr at 08/04/18 1055    LOS: 0 days   Kerney Elbe, DO [facility] PAGER is on AMION  If 7PM-7AM, please contact night-coverage [email or web address] Password [facility] 08/04/2018, 5:45 PM

## 2018-08-04 NOTE — Progress Notes (Signed)
Initial Nutrition Assessment  INTERVENTION:   Provide Ensure supplements from unit if patient consumes less of meals.  NUTRITION DIAGNOSIS:   Increased nutrient needs related to cancer and cancer related treatments as evidenced by estimated needs.  GOAL:   Patient will meet greater than or equal to 90% of their needs  MONITOR:   PO intake, Supplement acceptance, Labs, Weight trends, I & O's  REASON FOR ASSESSMENT:   Consult Assessment of nutrition requirement/status  ASSESSMENT:   52 y.o. 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  Patient not in room at time of visit. Per chart review, pt consuming 75-100% of meals consisting of an average of ~500 kcal and 15-20g protein. Pt ate an omelet and some pineapple this morning. Pt with increased needs given he started chemotherapy.   Per weight records, pt has not lost any weight but this may be masked by BLE edema. Pt with history of CHF. Pt weighed 215 lb in August 2019.    Medications: K-DUR tablet BID, Zofran PRN Labs reviewed:  Mg/Phos WNL  NUTRITION - FOCUSED PHYSICAL EXAM:  Pt not in room at time of visit.  Diet Order:   Diet Order            Diet Heart Room service appropriate? Yes; Fluid consistency: Thin  Diet effective now              EDUCATION NEEDS:   No education needs have been identified at this time  Skin:  Skin Assessment: Reviewed RN Assessment  Last BM:  11/26  Height:   Ht Readings from Last 1 Encounters:  08/01/18 5\' 11"  (1.803 m)    Weight:   Wt Readings from Last 1 Encounters:  08/01/18 97.1 kg    Ideal Body Weight:  78.2 kg  BMI:  Body mass index is 29.85 kg/m.  Estimated Nutritional Needs:   Kcal:   2200-2400  Protein:  110-120g  Fluid:  2.2L/day  Clayton Bibles, MS, RD, LDN [facility] Pager: [phone] After Hours Pager: [phone]

## 2018-08-05 DIAGNOSIS — C7800 Secondary malignant neoplasm of unspecified lung: Secondary | ICD-10-CM | POA: Diagnosis not present

## 2018-08-05 DIAGNOSIS — N390 Urinary tract infection, site not specified: Secondary | ICD-10-CM | POA: Diagnosis not present

## 2018-08-05 DIAGNOSIS — I824Y2 Acute embolism and thrombosis of unspecified deep veins of left proximal lower extremity: Secondary | ICD-10-CM | POA: Diagnosis not present

## 2018-08-05 DIAGNOSIS — R600 Localized edema: Secondary | ICD-10-CM | POA: Diagnosis not present

## 2018-08-05 DIAGNOSIS — I10 Essential (primary) hypertension: Secondary | ICD-10-CM | POA: Diagnosis not present

## 2018-08-05 LAB — CBC WITH DIFFERENTIAL/PLATELET
ABS IMMATURE GRANULOCYTES: 1.13 10*3/uL — AB (ref 0.00–0.07)
BASOS PCT: 0 %
Basophils Absolute: 0.1 10*3/uL (ref 0.0–0.1)
EOS PCT: 0 %
Eosinophils Absolute: 0 10*3/uL (ref 0.0–0.5)
HCT: 25.4 % — ABNORMAL LOW (ref 39.0–52.0)
HEMOGLOBIN: 8 g/dL — AB (ref 13.0–17.0)
Immature Granulocytes: 4 %
Lymphocytes Relative: 7 %
Lymphs Abs: 1.9 10*3/uL (ref 0.7–4.0)
MCH: 32.7 pg (ref 26.0–34.0)
MCHC: 31.5 g/dL (ref 30.0–36.0)
MCV: 103.7 fL — AB (ref 80.0–100.0)
MONO ABS: 3.3 10*3/uL — AB (ref 0.1–1.0)
MONOS PCT: 12 %
NEUTROS ABS: 21.9 10*3/uL — AB (ref 1.7–7.7)
Neutrophils Relative %: 77 %
Platelets: 267 10*3/uL (ref 150–400)
RBC: 2.45 MIL/uL — ABNORMAL LOW (ref 4.22–5.81)
RDW: 17.3 % — ABNORMAL HIGH (ref 11.5–15.5)
WBC: 28.4 10*3/uL — AB (ref 4.0–10.5)
nRBC: 1 % — ABNORMAL HIGH (ref 0.0–0.2)

## 2018-08-05 LAB — COMPREHENSIVE METABOLIC PANEL
ALT: 39 U/L (ref 0–44)
AST: 36 U/L (ref 15–41)
Albumin: 2.7 g/dL — ABNORMAL LOW (ref 3.5–5.0)
Alkaline Phosphatase: 91 U/L (ref 38–126)
Anion gap: 6 (ref 5–15)
BUN: 17 mg/dL (ref 6–20)
CO2: 31 mmol/L (ref 22–32)
Calcium: 8.3 mg/dL — ABNORMAL LOW (ref 8.9–10.3)
Chloride: 102 mmol/L (ref 98–111)
Creatinine, Ser: 1.43 mg/dL — ABNORMAL HIGH (ref 0.61–1.24)
GFR calc Af Amer: >60 mL/min (ref >60)
GFR calc non Af Amer: 56 mL/min — ABNORMAL LOW (ref >60)
Glucose, Bld: 123 mg/dL — ABNORMAL HIGH (ref 70–99)
Potassium: 4 mmol/L (ref 3.5–5.1)
Sodium: 139 mmol/L (ref 135–145)
Total Bilirubin: 0.6 mg/dL (ref 0.3–1.2)
Total Protein: 5.9 g/dL — ABNORMAL LOW (ref 6.5–8.1)

## 2018-08-05 LAB — PHOSPHORUS: Phosphorus: 3.6 mg/dL (ref 2.5–4.6)

## 2018-08-05 LAB — MAGNESIUM: MAGNESIUM: 1.8 mg/dL (ref 1.7–2.4)

## 2018-08-05 MED ORDER — AMOXICILLIN 500 MG PO CAPS: 500.0000 mg | ORAL_CAPSULE | Freq: Three times a day (TID) | ORAL | 0 refills | Status: DC

## 2018-08-05 MED ORDER — BISACODYL 10 MG RE SUPP: 10.0000 mg | Freq: Every day | RECTAL | 0 refills | Status: DC | PRN

## 2018-08-05 MED ORDER — HEPARIN SOD (PORK) LOCK FLUSH 100 UNIT/ML IV SOLN
500.0000 [IU] | INTRAVENOUS | Status: AC | PRN
  Administered 2018-08-05: 500 [IU]

## 2018-08-05 MED ORDER — HEPARIN SOD (PORK) LOCK FLUSH 100 UNIT/ML IV SOLN
500.0000 [IU] | Freq: Once | INTRAVENOUS | Status: DC
  Filled 2018-08-05: qty 5

## 2018-08-05 NOTE — Plan of Care (Signed)
  Problem: Activity: Goal: Risk for activity intolerance will decrease Outcome: Progressing   Problem: Nutrition: Goal: Adequate nutrition will be maintained Outcome: Progressing   Problem: Safety: Goal: Ability to remain free from injury will improve Outcome: Progressing   

## 2018-08-05 NOTE — Discharge Summary (Signed)
Physician Discharge Summary  Dylan Gonzalez:[id number] DOB: 26-Aug-1966 DOA: 08/01/2018  PCP: Donald Prose, MD  Admit date: 08/01/2018 Discharge date: 08/05/2018  Time spent: 45 minutes  Recommendations for Outpatient Follow-up:  Patient will be discharged to home with home health physical and occupational therapy.  Patient will need to follow up with primary care provider within one week of discharge.  Follow-up with Dr. Alen Blew, oncology.  Patient should continue medications as prescribed.  Patient should follow a heart healthy diet.   Discharge Diagnoses:  Bilateral lower extremity edema Staph aureus UTI Acute kidney injury and chronic kidney disease, stage II/Renal insufficiency History of DVT of LLE Essential hypertension  History of prostate cancer, prior leiomyosarcoma of intra-abdominal site, malignant neoplasm metastatic to lung Coronary artery disease Hypokalemia Leukocytosis Macrocytic Anemia Thrombocytopenia  Chronic diastolic CHF  Discharge Condition: stable  Diet recommendation: heart healthy  Filed Weights   08/01/18 1931  Weight: 97.1 kg    History of present illness:  On 08/01/2018 by Dr. Tennis Must Dylan Gonzalez is a 52 y.o. 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  He also states he had 4 episodes of diarrhea 2 days ago, but has since then resolved.  He recently had rhinorrhea and mild productive cough of whitish/clear phlegm with "some red tinge".  He also has had progressively worse bilateral lower extremities edema, more affected on the left for the past week and a half.  He denies headache, sore throat, dyspnea, wheezing, chest pain, palpitations, dizziness, diaphoresis, PND, or orthopnea.  He  denies constipation, melena or hematochezia.  He has had some dysuria and frequency.  He denies polyuria, polydipsia, polyphagia or blurred vision  Hospital Course:  Bilateral lower extremity edema -Patient with history of DVTs -Continue Xarelto -Lower extremity Doppler showed no evidence of DVT in the right or left extremities -Echocardiogram EF 50 to 58%, grade 2 diastolic dysfunction -BNP 330.4 -Chest x-ray showed no evidence of vascular congestion or infection -Patient denied orthopnea but stated his legs were more swollen on admission, suspect secondary to hypoalbuminemia -Nutrition consulted, recommended Ensure supplements -Continue TED hose and keeping lower extremities elevated -of note, patient is on amlodipine- which may also contribute to LE edema- advised patient to discuss BP management with PCP  Staph aureus UTI -UA showed cloudy urine, large hemoglobin, large leukocytes, positive nitrites, few bacteria, >50WBC -Urine culture showed >100K staph aureus -Was placed on cefepime and vancomycin however transitioned on amoxicillin -Blood cultures show no growth to date  Acute kidney injury and chronic kidney disease, stage II/Renal insufficiency -Baseline creatinine approximate 1.1, currently 1.4 -GFR > 60  History of DVT of LLE -Continue xarelto  Essential hypertension  -Continue metoprolol, amlodipine, HCTZ  History of prostate cancer, prior leiomyosarcoma of intra-abdominal site, malignant neoplasm metastatic to lung -Currently receiving chemotherapy and follows up with Dr. Alen Blew -Renal ultrasound showed mild left hydronephrosis, case was discussed with urology who felt this was chronic and of no concern.  Coronary artery disease -Currently chest pain-free, continue home medications -Echocardiogram as above  Hypokalemia -Resolved  Leukocytosis -Trending downward  Macrocytic Anemia -hemoglobin currently stable  Thrombocytopenia  -Resolved   Chronic  diastolic CHF -Currently appears to be euvolemic -Echocardiogram as above -Patient was given IV Lasix and transitioned to oral -  Though he has lower extremity edema, this is likely attributable to hypoalbuminemia  Physical deconditioning -PT, OT consulted recommending home health  Procedures: LE doppler  Consultations: Oncology   Discharge Exam: Vitals:   08/05/18 0418 08/05/18 1022  BP: 103/73 112/78  Pulse: 95 (!) 107  Resp: (!) 24 16  Temp: 98.5 F (36.9 C)   SpO2: 95% 97%   Patient states he is feeling better and would like to go home today.  Currently denies chest pain, shortness breath, abdominal pain, nausea or vomiting, diarrhea, dizziness or headache.  Does complain of some constipation but has not used a suppository.   General: Well developed, well nourished, NAD, appears stated age  HEENT: NCAT, mucous membranes moist.  Neck: Supple  Cardiovascular: S1 S2 auscultated, no rubs, murmurs or gallops. Regular rate and rhythm.  Respiratory: Clear to auscultation bilaterally with equal chest rise  Abdomen: Soft, nontender, nondistended, + bowel sounds  Extremities: warm dry without cyanosis clubbing. ++LE edema  Neuro: AAOx3, nonfocal  Psych: Appropriate mood and affect   Discharge Instructions Discharge Instructions    Discharge instructions   Complete by:  As directed    Patient will be discharged to home with home health physical and occupational therapy.  Patient will need to follow up with primary care provider within one week of discharge.  Follow-up with Dr. Alen Blew, oncology.  Patient should continue medications as prescribed.  Patient should follow a heart healthy diet.   For home use only DME Cane   Complete by:  As directed      Allergies as of 08/05/2018      Reactions   Pollen Extract Other (See Comments)   Sneezing and shortness of breath      Medication List    STOP taking these medications   belladonna-opium 16.2-30 MG  suppository Commonly known as:  B&O SUPPRETTES   cefUROXime 250 MG tablet Commonly known as:  CEFTIN   HYDROcodone-acetaminophen 5-325 MG tablet Commonly known as:  NORCO/VICODIN   hydrocortisone cream 1 %   Hyoscyamine Sulfate SL 0.125 MG Subl   metoprolol tartrate 50 MG tablet Commonly known as:  LOPRESSOR   phenazopyridine 200 MG tablet Commonly known as:  PYRIDIUM   prochlorperazine 10 MG tablet Commonly known as:  COMPAZINE     TAKE these medications   ALPRAZolam 0.5 MG tablet Commonly known as:  XANAX Take 0.5 mg by mouth 2 (two) times daily as needed for anxiety.   amLODipine 10 MG tablet Commonly known as:  NORVASC Take 1 tablet (10 mg total) by mouth daily.   amoxicillin 500 MG capsule Commonly known as:  AMOXIL Take 1 capsule (500 mg total) by mouth 3 (three) times daily.   bisacodyl 10 MG suppository Commonly known as:  DULCOLAX Place 1 suppository (10 mg total) rectally daily as needed for moderate constipation.   fluticasone 50 MCG/ACT nasal spray Commonly known as:  FLONASE PLACE 2 SPRAYS INTO BOTH NOSTRILS DAILY. What changed:  See the new instructions.   hydrochlorothiazide 25 MG tablet Commonly known as:  HYDRODIURIL Take 25 mg by mouth daily.   hydrOXYzine 100 MG capsule Commonly known as:  VISTARIL TAKE ONE CAPSULE BY MOUTH AT BEDTIME AS NEEDED FOR ALLERGY What changed:    how much to take  how to take this  when to take this  reasons to take this  additional instructions   lidocaine-prilocaine cream Commonly known as:  EMLA Apply 1 application topically as needed. What changed:  reasons to  take this   metoprolol succinate 50 MG 24 hr tablet Commonly known as:  TOPROL-XL Take 50 mg by mouth daily.   ondansetron 8 MG tablet Commonly known as:  ZOFRAN Take 1 tablet (8 mg total) by mouth every 8 (eight) hours as needed for nausea or vomiting.   oxyCODONE 5 MG immediate release tablet Commonly known as:  Oxy  IR/ROXICODONE Take 1-2 tablets (5-10 mg total) by mouth every 4 (four) hours as needed for severe pain.   polyethylene glycol packet Commonly known as:  MIRALAX / GLYCOLAX Take 17 g by mouth 2 (two) times daily as needed for mild constipation or moderate constipation.   PROVENTIL HFA 108 (90 Base) MCG/ACT inhaler Generic drug:  albuterol Inhale 1-2 puffs into the lungs every 6 (six) hours as needed for wheezing or shortness of breath.   rivaroxaban 20 MG Tabs tablet Commonly known as:  XARELTO Take 1 tablet (20 mg total) by mouth daily with supper. What changed:  Another medication with the same name was removed. Continue taking this medication, and follow the directions you see here.   STOOL SOFTENER PO Take 1 tablet by mouth daily as needed (constipation).            Durable Medical Equipment  (From admission, onward)         Start     Ordered   08/05/18 0000  For home use only DME Cane     08/05/18 1422         Allergies  Allergen Reactions  . Pollen Extract Other (See Comments)    Sneezing and shortness of breath      The results of significant diagnostics from this hospitalization (including imaging, microbiology, ancillary and laboratory) are listed below for reference.    Significant Diagnostic Studies: Dg Chest 2 View  Result Date: 08/01/2018 CLINICAL DATA:  52 y/o M; receiving treatment for prostate and bladder cancer. Fever and nausea. EXAM: CHEST - 2 VIEW COMPARISON:  06/29/2018 chest radiograph. FINDINGS: Stable normal cardiac silhouette given projection and technique. Stable port catheter tip projecting over upper SVC. Aortic atherosclerosis with calcification. Clear lungs. No pleural effusion or pneumothorax. No acute osseous abnormality is evident. IMPRESSION: No active cardiopulmonary disease. Electronically Signed   By: Kristine Garbe M.D.   On: 08/01/2018 20:31   US Renal  Result Date: 08/03/2018 CLINICAL DATA:  Prostate  leiomyosarcoma.  Acute renal insufficiency. EXAM: RENAL / URINARY TRACT ULTRASOUND COMPLETE COMPARISON:  CT scan May 22, 2018 FINDINGS: Right Kidney: Renal measurements: 11.6 x 4.4 x 5.2 cm = volume: 139.6 mL. The extrarenal pelvis is mildly prominent without hydronephrosis. Left Kidney: Renal measurements: 11.2 x 5.6 x 5.8 cm = volume: 190.7 mL. There is mild left-sided hydronephrosis. A hyperechoic structure seen in the left renal pelvis. Bladder: There is a pelvic mass measuring 13.8 x 9.5 x 9.3 cm, consistent with the patient's known malignancy. The bladder is decompressed with a Foley catheter and is displaced anteriorly by the pelvic mass. IMPRESSION: 1. There is mild left-sided hydronephrosis which is new compared to September 2019. 2. There is a hyperechoic structure in the dilated left renal pelvis. The patient had a ureteral stent extending from the left renal pelvis to the bladder on the September 2019 CT. Is it possible that the hyperechoic structure in the left renal pelvis is part of the patient's stent? Recommend clinical correlation. A stone is not excluded on this study. However, there were no left-sided stones on the comparison CT scan just  over 2 months ago. 3. Large left pelvic mass/known malignancy. 4. The bladder is poorly assessed as it is decompressed with a Foley catheter and displaced anteriorly by the pelvic mass. Electronically Signed   By: Dorise Bullion III M.D   On: 08/03/2018 16:56   Dg Chest Port 1 View  Result Date: 08/04/2018 CLINICAL DATA:  Fever, leg swelling. EXAM: PORTABLE CHEST 1 VIEW COMPARISON:  Frontal and lateral views 08/01/2018. chest CT 01/19/2018 FINDINGS: Low lung volumes. Right chest port with tip in the mid SVC.The cardiomediastinal contours are normal. Subcentimeter pulmonary nodules on prior CT are not well seen radiographically. Pulmonary vasculature is normal. No consolidation, pleural effusion, or pneumothorax. No acute osseous abnormalities are  seen. IMPRESSION: Low lung volumes without acute chest findings. Electronically Signed   By: Keith Rake M.D.   On: 08/04/2018 05:59   Vas [place] Lower Extremity Venous (dvt) (only [facility])  Result Date: 08/02/2018  Lower Venous Study Indications: Swelling, and Edema.  Performing Technologist: Abram Sander RVS  Examination Guidelines: A complete evaluation includes B-mode imaging, spectral Doppler, color Doppler, and power Doppler as needed of all accessible portions of each vessel. Bilateral testing is considered an integral part of a complete examination. Limited examinations for reoccurring indications may be performed as noted.  Right Venous Findings: +---------+---------------+---------+-----------+----------+-------+          CompressibilityPhasicitySpontaneityPropertiesSummary +---------+---------------+---------+-----------+----------+-------+ CFV      Full           Yes      Yes                          +---------+---------------+---------+-----------+----------+-------+ SFJ      Full                                                 +---------+---------------+---------+-----------+----------+-------+ FV Prox  Full                                                 +---------+---------------+---------+-----------+----------+-------+ FV Mid   Full                                                 +---------+---------------+---------+-----------+----------+-------+ FV DistalFull                                                 +---------+---------------+---------+-----------+----------+-------+ PFV      Full                                                 +---------+---------------+---------+-----------+----------+-------+ POP      Full           Yes      Yes                          +---------+---------------+---------+-----------+----------+-------+  PTV      Full                                                  +---------+---------------+---------+-----------+----------+-------+ PERO     Full                                                 +---------+---------------+---------+-----------+----------+-------+  Left Venous Findings: +---------+---------------+---------+-----------+----------+-------+          CompressibilityPhasicitySpontaneityPropertiesSummary +---------+---------------+---------+-----------+----------+-------+ CFV      Full           Yes      Yes                          +---------+---------------+---------+-----------+----------+-------+ SFJ      Full                                                 +---------+---------------+---------+-----------+----------+-------+ FV Prox  Full                                                 +---------+---------------+---------+-----------+----------+-------+ FV Mid   Full                                                 +---------+---------------+---------+-----------+----------+-------+ FV DistalFull                                                 +---------+---------------+---------+-----------+----------+-------+ PFV      Full                                                 +---------+---------------+---------+-----------+----------+-------+ POP      Full           Yes      Yes                          +---------+---------------+---------+-----------+----------+-------+ PTV      Full                                                 +---------+---------------+---------+-----------+----------+-------+ PERO     Full                                                 +---------+---------------+---------+-----------+----------+-------+  Summary: Right: There is no evidence of deep vein thrombosis in the lower extremity. No cystic structure found in the popliteal fossa. Left: There is no evidence of deep vein thrombosis in the lower extremity. No cystic structure found in the popliteal fossa.  *See  table(s) above for measurements and observations. Electronically signed by Harold Barban MD on 08/02/2018 at 3:05:07 PM.    Final     Microbiology: Recent Results (from the past 240 hour(s))  Urine Culture     Status: Abnormal   Collection Time: 08/01/18  9:11 PM  Result Value Ref Range Status   Specimen Description   Final    URINE, CLEAN CATCH Performed at [facility], [place] [address], [place], [place] [address]    Special Requests   Final    NONE Performed at [facility], [place] [address] [place], [place] [address]    Culture >=100,000 COLONIES/mL STAPHYLOCOCCUS AUREUS (A)  Final   Report Status 08/04/2018 FINAL  Final   Organism ID, Bacteria STAPHYLOCOCCUS AUREUS (A)  Final      Susceptibility   Staphylococcus aureus - MIC*    CIPROFLOXACIN <=0.5 SENSITIVE Sensitive     GENTAMICIN <=0.5 SENSITIVE Sensitive     NITROFURANTOIN 32 SENSITIVE Sensitive     OXACILLIN 0.5 SENSITIVE Sensitive     TETRACYCLINE <=1 SENSITIVE Sensitive     VANCOMYCIN <=0.5 SENSITIVE Sensitive     TRIMETH/SULFA <=10 SENSITIVE Sensitive     CLINDAMYCIN <=0.25 SENSITIVE Sensitive     RIFAMPIN <=0.5 SENSITIVE Sensitive     Inducible Clindamycin NEGATIVE Sensitive     * >=100,000 COLONIES/mL STAPHYLOCOCCUS AUREUS  Blood culture (routine x 2)     Status: None (Preliminary result)   Collection Time: 08/01/18 11:05 PM  Result Value Ref Range Status   Specimen Description   Final    BLOOD LEFT ANTECUBITAL Performed at [place] [address] [place], [place] [address]    Special Requests   Final    BOTTLES DRAWN AEROBIC AND ANAEROBIC Blood Culture results may not be optimal due to an excessive volume of blood received in culture bottles Performed at [place] [address], [place], [place] [address]    Culture   Final    NO GROWTH 3 DAYS Performed at [facility], [place] [address], [place], [place] [address]     Report Status PENDING  Incomplete  Blood culture (routine x 2)     Status: None (Preliminary result)   Collection Time: 08/01/18 11:05 PM  Result Value Ref Range Status   Specimen Description   Final    BLOOD RIGHT ANTECUBITAL Performed at [place] [address], [place], [place] [address]    Special Requests   Final    BOTTLES DRAWN AEROBIC AND ANAEROBIC Blood Culture adequate volume Performed at [place] [address], [place], [place] [address]    Culture   Final    NO GROWTH 3 DAYS Performed at [facility], [place] [address], [place], [place] [address]    Report Status PENDING  Incomplete     Labs: Basic Metabolic Panel: Recent Labs  Lab 08/01/18 2111 08/01/18 2309 08/02/18 0930 08/03/18 0235 08/04/18 0537 08/05/18 0416  NA 141  --  140 139 140 139  K 3.2*  --  3.4* 3.6 4.0 4.0  CL 104  --  106 106 104 102  CO2 28  --  28  26 28 31   GLUCOSE 105*  --  155* 116* 129* 123*  BUN 7  --  7 12 13 17   CREATININE 1.33*  --  1.26* 1.37* 1.47* 1.43*  CALCIUM 8.5*  --  8.0* 8.0* 8.4* 8.3*  MG  --  1.8 2.0 1.8 1.7 1.8  PHOS  --   --  3.2 3.7 4.3 3.6   Liver Function Tests: Recent Labs  Lab 08/01/18 2111 08/02/18 0930 08/03/18 0235 08/04/18 0537 08/05/18 0416  AST 27 24 20 24  36  ALT 21 18 19 26  39  ALKPHOS 113 96 95 101 91  BILITOT 0.7 0.5 0.7 0.8 0.6  PROT 6.3* 5.4* 5.5* 6.3* 5.9*  ALBUMIN 3.0* 2.7* 2.8* 3.0* 2.7*   No results for input(s): LIPASE, AMYLASE in the last 168 hours. No results for input(s): AMMONIA in the last 168 hours. CBC: Recent Labs  Lab 08/01/18 2111 08/02/18 0930 08/03/18 0235 08/04/18 0537 08/05/18 0416  WBC 44.6* 40.5* 42.4* 35.7* 28.4*  NEUTROABS  --  27.8* 30.9* 26.8* 21.9*  HGB 9.2* 8.6* 8.1* 9.2* 8.0*  HCT 29.5* 27.6* 26.1* 29.5* 25.4*  MCV 104.2* 106.2* 106.1* 103.5* 103.7*  PLT 126* 129* 147* 223 267   Cardiac Enzymes: No results for input(s): CKTOTAL, CKMB, CKMBINDEX,  TROPONINI in the last 168 hours. BNP: BNP (last 3 results) Recent Labs    08/01/18 2150  BNP 330.4*    ProBNP (last 3 results) No results for input(s): PROBNP in the last 8760 hours.  CBG: No results for input(s): GLUCAP in the last 168 hours.     Signed:  Cristal Ford  [facility] 08/05/2018, 2:25 PM

## 2018-08-05 NOTE — Evaluation (Signed)
Physical Therapy Evaluation Patient Details Name: Dylan Gonzalez MRN: [id number] DOB: 12-25-1965 Today's Date: 08/05/2018   History of Present Illness  52 yo male admitted with bil LE edema, UTI, gait instability. Hx of CAD, MI, DVT, malignant neoplasm to lung, primary leiomyosarcoma.   Clinical Impression  On eval, pt required Min guard-Min assist for mobility. He walked ~300 feet with use of a straight cane. LOB x 1 that required increased time and external assistance to prevent fall. Pt denied dizziness during session. He did c/o bil LE heaviness/weakness. Will recommend HHPT f/u for strength and balance training. Will follow during hospital stay.     Follow Up Recommendations Home health PT;Supervision for mobility/OOB    Equipment Recommendations  None recommended by PT    Recommendations for Other Services       Precautions / Restrictions Precautions Precautions: Fall Precaution Comments: foley, port a cath Restrictions Weight Bearing Restrictions: No      Mobility  Bed Mobility Overal bed mobility: Modified Independent                Transfers Overall transfer level: Needs assistance   Transfers: Sit to/from Stand Sit to Stand: Supervision         General transfer comment: for safety. Pt denied dizziness  Ambulation/Gait Ambulation/Gait assistance: Min assist;Min guard Gait Distance (Feet): 300 Feet Assistive device: Straight cane Gait Pattern/deviations: Decreased stride length     General Gait Details: LOB x 1 towards end of distance. Pt denied dizziness. He stated LOB "just hit him." Assist required to prevent fall-Min assist for this occurrence; otherwise, pt was Min guard assist. He tolerated distance well. He did c/o some LE fatigue, heaviness.   Stairs            Wheelchair Mobility    Modified Rankin (Stroke Patients Only)       Balance Overall balance assessment: Mild deficits observed, not formally tested                                            Pertinent Vitals/Pain Pain Assessment: No/denies pain    Home Living Family/patient expects to be discharged to:: Private residence Living Arrangements: Spouse/significant other Available Help at Discharge: Family Type of Home: House Home Access: Stairs to enter   Technical brewer of Steps: 2 Home Layout: One level Home Equipment: [place] - single point Additional Comments: wife also uses [facility] but walks with him and assists him    Prior Function Level of Independence: Needs assistance   Gait / Transfers Assistance Needed: min guard from wife for ambulation  ADL's / Homemaking Assistance Needed: mod/max A for LB adls from wife        Hand Dominance        Extremity/Trunk Assessment   Upper Extremity Assessment Upper Extremity Assessment: Defer to OT evaluation    Lower Extremity Assessment Lower Extremity Assessment: Generalized weakness    Cervical / Trunk Assessment Cervical / Trunk Assessment: Normal  Communication   Communication: No difficulties  Cognition Arousal/Alertness: Awake/alert Behavior During Therapy: WFL for tasks assessed/performed Overall Cognitive Status: Within Functional Limits for tasks assessed                                        General Comments  Exercises     Assessment/Plan    PT Assessment Patient needs continued PT services  PT Problem List Decreased mobility;Decreased balance;Decreased strength       PT Treatment Interventions DME instruction;Gait training;Functional mobility training;Therapeutic activities;Balance training;Therapeutic exercise    PT Goals (Current goals can be found in the Care Plan section)  Acute Rehab PT Goals Patient Stated Goal: none stated PT Goal Formulation: With patient Time For Goal Achievement: 08/19/18 Potential to Achieve Goals: Good    Frequency Min 3X/week   Barriers to discharge        Co-evaluation                AM-PAC PT "6 Clicks" Mobility  Outcome Measure Help needed turning from your back to your side while in a flat bed without using bedrails?: None Help needed moving from lying on your back to sitting on the side of a flat bed without using bedrails?: None Help needed moving to and from a bed to a chair (including a wheelchair)?: A Little Help needed standing up from a chair using your arms (e.g., wheelchair or bedside chair)?: A Little Help needed to walk in hospital room?: A Little Help needed climbing 3-5 steps with a railing? : A Little 6 Click Score: 20    End of Session Equipment Utilized During Treatment: Gait belt Activity Tolerance: Patient tolerated treatment well Patient left: in chair;with call bell/phone within reach   PT Visit Diagnosis: Unsteadiness on feet (R26.81);Muscle weakness (generalized) (M62.81)    Time: 1610-9604 PT Time Calculation (min) (ACUTE ONLY): 13 min   Charges:   PT Evaluation $PT Eval Moderate Complexity: [place], PT Acute Rehabilitation Services Pager: [phone] Office: [phone]

## 2018-08-05 NOTE — Evaluation (Signed)
Occupational Therapy Evaluation Patient Details Name: Dylan Gonzalez MRN: [id number] DOB: Dec 01, 1965 Today's Date: 08/05/2018    History of Present Illness pt was admitted with bil LE edema.  PMH:  lung CA--undergoing chemo and h/o L eye retinoblastoma, CAD and MI.  Has foley   Clinical Impression   Pt was admitted for the above.  Per notes, - DVTs.  Pt is near baseline which is min guard for ambulating and mod/max for LB adls.  Wife uses a cane but walks with him.  He reports he is sometimes dizzy/lightheaded.  BP dropped but he was not orthostatic/symptomatic. (see vitals).  Started energy conservation education. 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    Follow Up Recommendations  No OT follow up    Equipment Recommendations  (to be further assessed)    Recommendations for Other Services       Precautions / Restrictions Precautions Precautions: Fall Precaution Comments: foley, port a cath Restrictions Weight Bearing Restrictions: No      Mobility Bed Mobility               General bed mobility comments: supervision for lines  Transfers Overall transfer level: Needs assistance Equipment used: 1 person hand held assist Transfers: Sit to/from Stand Sit to Stand: Min guard         General transfer comment: for safety    Balance                                           ADL either performed or assessed with clinical judgement   ADL Overall ADL's : Needs assistance/impaired Eating/Feeding: Independent   Grooming: Oral care;Supervision/safety;Standing   Upper Body Bathing: Set up;Sitting   Lower Body Bathing: Moderate assistance;Maximal assistance;Sit to/from stand   Upper Body Dressing : Minimal assistance(line)   Lower Body Dressing: Maximal assistance;Sit to/from stand   Toilet Transfer: Min guard;Minimal assistance;Stand-pivot(to sink and chair)   Toileting- Water quality scientist and Hygiene:  Moderate assistance;Sit to/from stand         General ADL Comments: near baseline.  Began energy conservation education.  Pt was used to being very active     Museum/gallery curator      Pertinent Vitals/Pain Pain Assessment: No/denies pain     Hand Dominance     Extremity/Trunk Assessment Upper Extremity Assessment Upper Extremity Assessment: Overall WFL for tasks assessed           Communication Communication Communication: No difficulties   Cognition Arousal/Alertness: Awake/alert Behavior During Therapy: WFL for tasks assessed/performed Overall Cognitive Status: Within Functional Limits for tasks assessed                                     General Comments  Pt did not c/o dizziness today.  From lying to standing x 3 minutes, SBP dropped 19 points. Pt was asymptomatic.      Exercises     Shoulder Instructions      Home Living Family/patient expects to be discharged to:: Private residence Living Arrangements: Spouse/significant other                 Bathroom Shower/Tub: Tub/shower unit(sponge bathes since foley)   Bathroom Toilet: Standard  Home Equipment: Kasandra Knudsen - single point   Additional Comments: wife also uses [facility] but walks with him and assists him  Talked to wife about tub bench as an option (which is typically out of pocket). She is familiar with this but doesn't want foley to get wet.  Sponge bathing is a safe option    Prior Functioning/Environment Level of Independence: Needs assistance  Gait / Transfers Assistance Needed: min guard from wife ADL's / Homemaking Assistance Needed: mod/max A for LB adls from wife            OT Problem List: Decreased activity tolerance;Impaired balance (sitting and/or standing);Increased edema      OT Treatment/Interventions: Self-care/ADL training;Energy conservation;DME and/or AE instruction;Patient/family education;Balance training    OT Goals(Current goals  can be found in the care plan section) Acute Rehab OT Goals Patient Stated Goal: none stated OT Goal Formulation: With patient Time For Goal Achievement: 08/19/18 Potential to Achieve Goals: Good ADL Goals Pt Will Transfer to Toilet: with supervision;ambulating;regular height toilet;bedside commode Additional ADL Goal #1: pt will verbalize 3 energy conservation strategies  OT Frequency: Min 2X/week   Barriers to D/C:            Co-evaluation              AM-PAC OT "6 Clicks" Daily Activity     Outcome Measure Help from another person eating meals?: None Help from another person taking care of personal grooming?: A Lot Help from another person toileting, which includes using toliet, bedpan, or urinal?: A Little Help from another person bathing (including washing, rinsing, drying)?: A Lot Help from another person to put on and taking off regular upper body clothing?: A Little Help from another person to put on and taking off regular lower body clothing?: A Lot 6 Click Score: 16   End of Session Equipment Utilized During Treatment: Gait belt  Activity Tolerance: Patient tolerated treatment well Patient left: in chair;with call bell/phone within reach;with family/visitor present  OT Visit Diagnosis: Unsteadiness on feet (R26.81)                Time: 6384-5364 OT Time Calculation (min): 37 min Charges:  OT General Charges $OT Visit: 1 Visit OT Evaluation $OT Eval Low Complexity: 1 Low OT Treatments $Self Care/Home Management : 8-22 mins  Lesle Chris, OTR/L Acute Rehabilitation Services [phone] [facility] pager [phone] office 08/05/2018  [place] 08/05/2018, 10:21 AM

## 2018-08-05 NOTE — Progress Notes (Signed)
08/05/18 1630 Reviewed discharge instructions with patient. Patient verbalized understanding of discharge instructions. Copy of discharge instructions and a leg bag given to patient.

## 2018-08-05 NOTE — Care Management Note (Signed)
Case Management Note  Patient Details  Name: RAKESH DUTKO MRN: [id number] Date of Birth: 01/28/1966  Subjective/Objective:  UTI, bilateral lower extremity edema                  Action/Plan: Spoke to pt and offered choice for [facility]. Pt states he had [facility] in the past. Has cane at home. Wife at home to assist with care. Contacted [facility] with new referral for [facility].   PCP Dr Nancy Fetter  Expected Discharge Date:  08/05/18               Expected Discharge Plan:  [facility]  In-House Referral:  NA  Discharge planning Services  CM Consult  Post Acute Care Choice:  Home Health Choice offered to:  Patient  DME Arranged:  [name] DME Agency:  NA  HH Arranged:  PT, OT HH Agency:  [facility]  Status of Service:  Completed, signed off  If discussed at [place] of Stay Meetings, dates discussed:    Additional Comments:  Erenest Rasher, RN 08/05/2018, 2:35 PM

## 2018-08-07 ENCOUNTER — Inpatient Hospital Stay: Payer: BLUE CROSS/BLUE SHIELD

## 2018-08-07 ENCOUNTER — Inpatient Hospital Stay: Payer: BLUE CROSS/BLUE SHIELD | Attending: Oncology

## 2018-08-07 ENCOUNTER — Telehealth: Payer: Self-pay | Admitting: Oncology

## 2018-08-07 ENCOUNTER — Inpatient Hospital Stay (HOSPITAL_BASED_OUTPATIENT_CLINIC_OR_DEPARTMENT_OTHER): Payer: BLUE CROSS/BLUE SHIELD | Admitting: Oncology

## 2018-08-07 DIAGNOSIS — C494 Malignant neoplasm of connective and soft tissue of abdomen: Secondary | ICD-10-CM | POA: Insufficient documentation

## 2018-08-07 DIAGNOSIS — Z7901 Long term (current) use of anticoagulants: Secondary | ICD-10-CM | POA: Insufficient documentation

## 2018-08-07 DIAGNOSIS — C7800 Secondary malignant neoplasm of unspecified lung: Secondary | ICD-10-CM

## 2018-08-07 DIAGNOSIS — Z5111 Encounter for antineoplastic chemotherapy: Secondary | ICD-10-CM | POA: Insufficient documentation

## 2018-08-07 DIAGNOSIS — Z23 Encounter for immunization: Secondary | ICD-10-CM

## 2018-08-07 DIAGNOSIS — D630 Anemia in neoplastic disease: Secondary | ICD-10-CM

## 2018-08-07 DIAGNOSIS — Z86718 Personal history of other venous thrombosis and embolism: Secondary | ICD-10-CM | POA: Insufficient documentation

## 2018-08-07 DIAGNOSIS — C419 Malignant neoplasm of bone and articular cartilage, unspecified: Secondary | ICD-10-CM

## 2018-08-07 DIAGNOSIS — R600 Localized edema: Secondary | ICD-10-CM | POA: Diagnosis not present

## 2018-08-07 DIAGNOSIS — Z95828 Presence of other vascular implants and grafts: Secondary | ICD-10-CM

## 2018-08-07 DIAGNOSIS — Z79899 Other long term (current) drug therapy: Secondary | ICD-10-CM | POA: Diagnosis not present

## 2018-08-07 LAB — CMP (CANCER CENTER ONLY)
ALT: 45 U/L — ABNORMAL HIGH (ref 0–44)
AST: 43 U/L — ABNORMAL HIGH (ref 15–41)
Albumin: 2.7 g/dL — ABNORMAL LOW (ref 3.5–5.0)
Alkaline Phosphatase: 142 U/L — ABNORMAL HIGH (ref 38–126)
Anion gap: 11 (ref 5–15)
BUN: 15 mg/dL (ref 6–20)
CO2: 27 mmol/L (ref 22–32)
Calcium: 9.2 mg/dL (ref 8.9–10.3)
Chloride: 102 mmol/L (ref 98–111)
Creatinine: 1.29 mg/dL — ABNORMAL HIGH (ref 0.61–1.24)
GFR, Estimated: >60 mL/min (ref >60)
Glucose, Bld: 109 mg/dL — ABNORMAL HIGH (ref 70–99)
Potassium: 4.6 mmol/L (ref 3.5–5.1)
Sodium: 140 mmol/L (ref 135–145)
TOTAL PROTEIN: 6.7 g/dL (ref 6.5–8.1)
Total Bilirubin: 0.5 mg/dL (ref 0.3–1.2)

## 2018-08-07 LAB — CULTURE, BLOOD (ROUTINE X 2)
CULTURE: NO GROWTH
Culture: NO GROWTH
Special Requests: ADEQUATE

## 2018-08-07 LAB — CBC WITH DIFFERENTIAL (CANCER CENTER ONLY)
Abs Immature Granulocytes: 0.37 10*3/uL — ABNORMAL HIGH (ref 0.00–0.07)
BASOS PCT: 0 %
Basophils Absolute: 0 10*3/uL (ref 0.0–0.1)
EOS ABS: 0 10*3/uL (ref 0.0–0.5)
Eosinophils Relative: 0 %
HCT: 25.1 % — ABNORMAL LOW (ref 39.0–52.0)
Hemoglobin: 8.2 g/dL — ABNORMAL LOW (ref 13.0–17.0)
Immature Granulocytes: 2 %
Lymphocytes Relative: 5 %
Lymphs Abs: 1.2 10*3/uL (ref 0.7–4.0)
MCH: 31.9 pg (ref 26.0–34.0)
MCHC: 32.7 g/dL (ref 30.0–36.0)
MCV: 97.7 fL (ref 80.0–100.0)
Monocytes Absolute: 2.4 10*3/uL — ABNORMAL HIGH (ref 0.1–1.0)
Monocytes Relative: 9 %
Neutro Abs: 21.4 10*3/uL — ABNORMAL HIGH (ref 1.7–7.7)
Neutrophils Relative %: 84 %
PLATELETS: 498 10*3/uL — AB (ref 150–400)
RBC: 2.57 MIL/uL — AB (ref 4.22–5.81)
RDW: 17.7 % — AB (ref 11.5–15.5)
WBC Count: 25.5 10*3/uL — ABNORMAL HIGH (ref 4.0–10.5)
nRBC: 0.7 % — ABNORMAL HIGH (ref 0.0–0.2)

## 2018-08-07 MED ORDER — HEPARIN SOD (PORK) LOCK FLUSH 100 UNIT/ML IV SOLN
500.0000 [IU] | Freq: Once | INTRAVENOUS | Status: AC | PRN
  Administered 2018-08-07: 500 [IU]
  Filled 2018-08-07: qty 5

## 2018-08-07 MED ORDER — SODIUM CHLORIDE 0.9 % IV SOLN
Freq: Once | INTRAVENOUS | Status: AC
  Administered 2018-08-07: 12:00:00 via INTRAVENOUS
  Filled 2018-08-07: qty 250

## 2018-08-07 MED ORDER — PROCHLORPERAZINE MALEATE 10 MG PO TABS
ORAL_TABLET | ORAL | Status: AC
  Filled 2018-08-07: qty 1

## 2018-08-07 MED ORDER — SODIUM CHLORIDE 0.9% FLUSH
10.0000 mL | Freq: Once | INTRAVENOUS | Status: AC
  Administered 2018-08-07: 10 mL
  Filled 2018-08-07: qty 10

## 2018-08-07 MED ORDER — PROCHLORPERAZINE MALEATE 10 MG PO TABS
10.0000 mg | ORAL_TABLET | Freq: Once | ORAL | Status: AC
  Administered 2018-08-07: 10 mg via ORAL

## 2018-08-07 MED ORDER — SODIUM CHLORIDE 0.9% FLUSH
10.0000 mL | INTRAVENOUS | Status: DC | PRN
  Administered 2018-08-07: 10 mL
  Filled 2018-08-07: qty 10

## 2018-08-07 MED ORDER — INFLUENZA VAC SPLIT QUAD 0.5 ML IM SUSY
PREFILLED_SYRINGE | INTRAMUSCULAR | Status: AC
  Filled 2018-08-07: qty 0.5

## 2018-08-07 MED ORDER — SODIUM CHLORIDE 0.9 % IV SOLN
2000.0000 mg | Freq: Once | INTRAVENOUS | Status: AC
  Administered 2018-08-07: 2000 mg via INTRAVENOUS
  Filled 2018-08-07: qty 52.6

## 2018-08-07 MED ORDER — INFLUENZA VAC SPLIT QUAD 0.5 ML IM SUSY
0.5000 mL | PREFILLED_SYRINGE | Freq: Once | INTRAMUSCULAR | Status: AC
  Administered 2018-08-07: 0.5 mL via INTRAMUSCULAR

## 2018-08-07 NOTE — Progress Notes (Signed)
Per Dr. Alen Blew, ok to treat in spite of tachycardia. No new orders.

## 2018-08-07 NOTE — Telephone Encounter (Signed)
Printed calendar and avs. °

## 2018-08-07 NOTE — Progress Notes (Signed)
Hematology and Oncology Follow Up Visit  Dylan Gonzalez [id number] 10/10/1965 52 y.o. 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  Prior Therapy: He is S/P transrectal prostate ultrasound and a prostate biopsy as well as a cystoscopy with insertion of double-J stent and transurethral resection of the prostate completed by Dr. Jeffie Pollock on April 13, 2018.  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  Immunohistochemical stains showed positive for desmin, smooth muscle actin, muscle specific actin and negative for PSA.  He was also negative for cytokeratin 7, 903, CD117 and CD34.     Current therapy: Taxotere with gemcitabine started on 05/30/2018.  He is here for cycle 4 of therapy.  Interim History: [name] returns today for a repeat evaluation.  Since the last visit, he was hospitalized between 08/01/2018 and 08/05/2018 for urinary tract infection.  He was also noted to have lower extremity edema and hematuria.  Ultrasound Dopplers of his lower extremity showed no evidence of deep vein thrombosis at this time.  He was discharged on oral antibiotics and continued to be on Xarelto.  Since his discharge, he remains ambulatory without any falls or unsteadiness.  His appetite has decreased slightly.  He does report some occasional constipation but no diarrhea.  He has tolerated chemotherapy reasonably well at this time.  He does not report any headaches, blurry vision, syncope or seizures.  He denies any confusion or lethargy..  Does not report any fevers, chills or sweats.  Does not report any cough, wheezing or hemoptysis.  Does not report any chest pain, palpitation, orthopnea but does  report chronic leg edema.  Does not report any nausea, vomiting or distention.  Denies any hematochezia or melena.  Denies any bone pain or pathological fractures.   Does not report frequency, urgency or hematuria.  Does not report any skin rashes or lesions.  Denies any anxiety or depression.  Remaining review of systems is negative.    Medications: I have reviewed the patient's current medications.  Current Outpatient Medications  Medication Sig Dispense Refill  . albuterol (PROVENTIL HFA) 108 (90 Base) MCG/ACT inhaler Inhale 1-2 puffs into the lungs every 6 (six) hours as needed for wheezing or shortness of breath.    . ALPRAZolam (XANAX) 0.5 MG tablet Take 0.5 mg by mouth 2 (two) times daily as needed for anxiety.   0  . amLODipine (NORVASC) 10 MG tablet Take 1 tablet (10 mg total) by mouth daily. 30 tablet 1  . amoxicillin (AMOXIL) 500 MG capsule Take 1 capsule (500 mg total) by mouth 3 (three) times daily. 12 capsule 0  . bisacodyl (DULCOLAX) 10 MG suppository Place 1 suppository (10 mg total) rectally daily as needed for moderate constipation. 12 suppository 0  . Docusate Calcium (STOOL SOFTENER PO) Take 1 tablet by mouth daily as needed (constipation).     . fluticasone (FLONASE) 50 MCG/ACT nasal spray PLACE 2 SPRAYS INTO BOTH NOSTRILS DAILY. (Patient taking differently: Place 2 sprays into both nostrils daily as needed for allergies. ) 16 g 9  . hydrochlorothiazide (HYDRODIURIL) 25 MG tablet Take 25 mg by mouth daily.   0  . hydrOXYzine (VISTARIL) 100 MG capsule TAKE ONE CAPSULE BY MOUTH AT BEDTIME AS NEEDED FOR ALLERGY (  Patient taking differently: Take 100 mg by mouth at bedtime as needed for itching. ) 30 capsule 5  . lidocaine-prilocaine (EMLA) cream Apply 1 application topically as needed. (Patient taking differently: Apply 1 application topically as needed (port access). ) 30 g 0  . metoprolol succinate (TOPROL-XL) 50 MG 24 hr tablet Take 50 mg by mouth daily.  1  . ondansetron  (ZOFRAN) 8 MG tablet Take 1 tablet (8 mg total) by mouth every 8 (eight) hours as needed for nausea or vomiting. 20 tablet 0  . oxyCODONE (OXY IR/ROXICODONE) 5 MG immediate release tablet Take 1-2 tablets (5-10 mg total) by mouth every 4 (four) hours as needed for severe pain. 90 tablet 0  . polyethylene glycol (MIRALAX / GLYCOLAX) packet Take 17 g by mouth 2 (two) times daily as needed for mild constipation or moderate constipation. 14 each 0  . rivaroxaban (XARELTO) 20 MG TABS tablet Take 1 tablet (20 mg total) by mouth daily with supper. 30 tablet 3   No current facility-administered medications for this visit.      Allergies:  Allergies  Allergen Reactions  . Pollen Extract Other (See Comments)    Sneezing and shortness of breath    Past Medical History, Surgical history, Social history, and Family History were reviewed and updated.    Physical Exam:  Blood pressure 124/80, pulse (!) 115, temperature 98.6 F (37 C), temperature source Oral, resp. rate 18, height 5' 11"  (1.803 m), weight 218 lb 3.2 oz (99 kg), SpO2 100 %.     ECOG: 1    General appearance: Alert, awake without any distress. Head: Atraumatic without abnormalities Oropharynx: Without any thrush or ulcers. Eyes: No scleral icterus. Lymph nodes: No lymphadenopathy noted in the cervical, supraclavicular, or axillary nodes Heart:regular rate and rhythm, without any murmurs or gallops.   Lung: Clear to auscultation without any rhonchi, wheezes or dullness to percussion. Abdomin: Soft, nontender without any shifting dullness or ascites. Musculoskeletal: No clubbing or cyanosis. Neurological: No motor or sensory deficits. Skin: No rashes or lesions.       Lab Results: Lab Results  Component Value Date   WBC 28.4 (H) 08/05/2018   HGB 8.0 (L) 08/05/2018   HCT 25.4 (L) 08/05/2018   MCV 103.7 (H) 08/05/2018   PLT 267 08/05/2018     Chemistry      Component Value Date/Time   NA 139 08/05/2018 0416    K 4.0 08/05/2018 0416   CL 102 08/05/2018 0416   CO2 31 08/05/2018 0416   BUN 17 08/05/2018 0416   CREATININE 1.43 (H) 08/05/2018 0416   CREATININE 1.06 07/26/2018 1245   CREATININE 1.07 06/01/2014 1257      Component Value Date/Time   CALCIUM 8.3 (L) 08/05/2018 0416   ALKPHOS 91 08/05/2018 0416   AST 36 08/05/2018 0416   AST 15 07/26/2018 1245   ALT 39 08/05/2018 0416   ALT 12 07/26/2018 1245   BILITOT 0.6 08/05/2018 0416   BILITOT 0.5 07/26/2018 1245         Impression and Plan:  52 year old man with:  1.    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    He continues to receive systemic chemotherapy utilizing gemcitabine and Taxotere.  He has tolerated the last 3 cycles without any complaints.  He was not able to get CT scan scheduled for staging work-up because of his recent hospitalization.  Risks and benefits of continuing therapy at this time was  discussed today and he is agreeable to continue.  We will proceed with cycle 4 without any dose reduction or delay and or repeat imaging studies next week.  2.  IV access: Port-A-Cath is in use without any issues.  3.  Left common femoral vein DVT: He remains on Xarelto.  Repeat Dopplers did not show any evidence of residual thrombosis.  I have recommended continuing Xarelto for the time being.  4.  Urinary obstruction: Foley catheter remains in place.  I recommend continuing to follow with urology.  5.  Pelvic pain: Pain is manageable at this time with hydrocodone.  6.  Lower extremity edema: Related to previous thrombosis, chemotherapy among other causes.  Will assess electrolytes today and consider adding Lasix.  7.  Follow-up: In next week for day 8 of chemotherapy.  Will have repeat CT scan as well next week.  Cycle 5 will be tentatively scheduled on 08/28/2018.  25 minutes was spent with the patient face-to-face today.  More than 50% of time was dedicated to  discussing disease status, treatment options and coordinating plan of care.     [name], MD 12/2/201910:06 AM

## 2018-08-07 NOTE — Patient Instructions (Signed)
[facility] °Discharge Instructions for Patients Receiving Chemotherapy ° °Today you received the following chemotherapy agents Gemzar ° °To help prevent nausea and vomiting after your treatment, we encourage you to take your nausea medication as directed. °  °If you develop nausea and vomiting that is not controlled by your nausea medication, call the clinic.  ° °BELOW ARE SYMPTOMS THAT SHOULD BE REPORTED IMMEDIATELY: °· *FEVER GREATER THAN 100.5 F °· *CHILLS WITH OR WITHOUT FEVER °· NAUSEA AND VOMITING THAT IS NOT CONTROLLED WITH YOUR NAUSEA MEDICATION °· *UNUSUAL SHORTNESS OF BREATH °· *UNUSUAL BRUISING OR BLEEDING °· TENDERNESS IN MOUTH AND THROAT WITH OR WITHOUT PRESENCE OF ULCERS °· *URINARY PROBLEMS °· *BOWEL PROBLEMS °· UNUSUAL RASH °Items with * indicate a potential emergency and should be followed up as soon as possible. ° °Feel free to call the clinic should you have any questions or concerns. The clinic phone number is [phone]. ° °Please show the CHEMO ALERT CARD at check-in to the Emergency Department and triage nurse. ° ° °

## 2018-08-08 ENCOUNTER — Telehealth: Payer: Self-pay | Admitting: *Deleted

## 2018-08-08 NOTE — Telephone Encounter (Signed)
FYI "Dylan Gonzalez's wife Annie Main [phone]) calling to ask if I can give him two 5 mg pain pills.  He's in a lot of pain; crying.  Pain at a level ten at the prostate tumor area and groin because catheter changed, removed.  I want to make sure because I gave him two at bedtime last night because one was not taking away all the pain."      Reviewed OXY IR 5 mg directions to use 1 to 2 tablets every four hrs as needed for severe pain.  Frequency no less than four hours apart.  May take two.  Use pain scale to help gauge use.  Starting with one pill at level five may provide more effective relief.

## 2018-08-08 NOTE — Telephone Encounter (Signed)
Agreed -

## 2018-08-09 DIAGNOSIS — C61 Malignant neoplasm of prostate: Secondary | ICD-10-CM | POA: Diagnosis not present

## 2018-08-09 DIAGNOSIS — I5032 Chronic diastolic (congestive) heart failure: Secondary | ICD-10-CM | POA: Diagnosis not present

## 2018-08-09 DIAGNOSIS — I130 Hypertensive heart and chronic kidney disease with heart failure and stage 1 through stage 4 chronic kidney disease, or unspecified chronic kidney disease: Secondary | ICD-10-CM | POA: Diagnosis not present

## 2018-08-09 DIAGNOSIS — R1907 Generalized intra-abdominal and pelvic swelling, mass and lump: Secondary | ICD-10-CM | POA: Diagnosis not present

## 2018-08-09 DIAGNOSIS — D631 Anemia in chronic kidney disease: Secondary | ICD-10-CM | POA: Diagnosis not present

## 2018-08-09 DIAGNOSIS — D539 Nutritional anemia, unspecified: Secondary | ICD-10-CM | POA: Diagnosis not present

## 2018-08-09 DIAGNOSIS — C7800 Secondary malignant neoplasm of unspecified lung: Secondary | ICD-10-CM | POA: Diagnosis not present

## 2018-08-09 DIAGNOSIS — C494 Malignant neoplasm of connective and soft tissue of abdomen: Secondary | ICD-10-CM | POA: Diagnosis not present

## 2018-08-09 DIAGNOSIS — B958 Unspecified staphylococcus as the cause of diseases classified elsewhere: Secondary | ICD-10-CM | POA: Diagnosis not present

## 2018-08-09 DIAGNOSIS — N182 Chronic kidney disease, stage 2 (mild): Secondary | ICD-10-CM | POA: Diagnosis not present

## 2018-08-09 DIAGNOSIS — I2510 Atherosclerotic heart disease of native coronary artery without angina pectoris: Secondary | ICD-10-CM | POA: Diagnosis not present

## 2018-08-09 DIAGNOSIS — I700 Atherosclerosis of aorta: Secondary | ICD-10-CM | POA: Diagnosis not present

## 2018-08-09 DIAGNOSIS — N390 Urinary tract infection, site not specified: Secondary | ICD-10-CM | POA: Diagnosis not present

## 2018-08-13 ENCOUNTER — Encounter (HOSPITAL_COMMUNITY): Payer: Self-pay | Admitting: Emergency Medicine

## 2018-08-13 ENCOUNTER — Other Ambulatory Visit: Payer: Self-pay

## 2018-08-13 ENCOUNTER — Emergency Department (HOSPITAL_COMMUNITY): Payer: BLUE CROSS/BLUE SHIELD

## 2018-08-13 ENCOUNTER — Inpatient Hospital Stay (HOSPITAL_COMMUNITY)
Admission: EM | Admit: 2018-08-13 | Discharge: 2018-08-18 | DRG: 515 | Disposition: A | Discharge status: Home / Self Care | Payer: BLUE CROSS/BLUE SHIELD | Attending: Infectious Disease | Admitting: Infectious Disease

## 2018-08-13 DIAGNOSIS — R600 Localized edema: Secondary | ICD-10-CM | POA: Diagnosis present

## 2018-08-13 DIAGNOSIS — C799 Secondary malignant neoplasm of unspecified site: Secondary | ICD-10-CM

## 2018-08-13 DIAGNOSIS — G8929 Other chronic pain: Secondary | ICD-10-CM | POA: Diagnosis present

## 2018-08-13 DIAGNOSIS — D62 Acute posthemorrhagic anemia: Secondary | ICD-10-CM | POA: Diagnosis present

## 2018-08-13 DIAGNOSIS — I82402 Acute embolism and thrombosis of unspecified deep veins of left lower extremity: Secondary | ICD-10-CM | POA: Diagnosis not present

## 2018-08-13 DIAGNOSIS — I5033 Acute on chronic diastolic (congestive) heart failure: Secondary | ICD-10-CM | POA: Diagnosis not present

## 2018-08-13 DIAGNOSIS — Z9001 Acquired absence of eye: Secondary | ICD-10-CM | POA: Diagnosis not present

## 2018-08-13 DIAGNOSIS — I2510 Atherosclerotic heart disease of native coronary artery without angina pectoris: Secondary | ICD-10-CM | POA: Diagnosis present

## 2018-08-13 DIAGNOSIS — Z9079 Acquired absence of other genital organ(s): Secondary | ICD-10-CM

## 2018-08-13 DIAGNOSIS — R000 Tachycardia, unspecified: Secondary | ICD-10-CM | POA: Diagnosis not present

## 2018-08-13 DIAGNOSIS — D649 Anemia, unspecified: Secondary | ICD-10-CM

## 2018-08-13 DIAGNOSIS — I82432 Acute embolism and thrombosis of left popliteal vein: Secondary | ICD-10-CM | POA: Diagnosis present

## 2018-08-13 DIAGNOSIS — Z7901 Long term (current) use of anticoagulants: Secondary | ICD-10-CM | POA: Diagnosis not present

## 2018-08-13 DIAGNOSIS — I110 Hypertensive heart disease with heart failure: Secondary | ICD-10-CM | POA: Diagnosis present

## 2018-08-13 DIAGNOSIS — C495 Malignant neoplasm of connective and soft tissue of pelvis: Secondary | ICD-10-CM | POA: Diagnosis not present

## 2018-08-13 DIAGNOSIS — Z79899 Other long term (current) drug therapy: Secondary | ICD-10-CM | POA: Diagnosis not present

## 2018-08-13 DIAGNOSIS — Z792 Long term (current) use of antibiotics: Secondary | ICD-10-CM

## 2018-08-13 DIAGNOSIS — C494 Malignant neoplasm of connective and soft tissue of abdomen: Secondary | ICD-10-CM | POA: Diagnosis present

## 2018-08-13 DIAGNOSIS — Z91048 Other nonmedicinal substance allergy status: Secondary | ICD-10-CM | POA: Diagnosis not present

## 2018-08-13 DIAGNOSIS — Z818 Family history of other mental and behavioral disorders: Secondary | ICD-10-CM

## 2018-08-13 DIAGNOSIS — Z8249 Family history of ischemic heart disease and other diseases of the circulatory system: Secondary | ICD-10-CM | POA: Diagnosis not present

## 2018-08-13 DIAGNOSIS — Z85840 Personal history of malignant neoplasm of eye: Secondary | ICD-10-CM

## 2018-08-13 DIAGNOSIS — R531 Weakness: Secondary | ICD-10-CM | POA: Diagnosis not present

## 2018-08-13 DIAGNOSIS — N029 Recurrent and persistent hematuria with unspecified morphologic changes: Secondary | ICD-10-CM | POA: Diagnosis present

## 2018-08-13 DIAGNOSIS — Z79891 Long term (current) use of opiate analgesic: Secondary | ICD-10-CM | POA: Diagnosis not present

## 2018-08-13 DIAGNOSIS — N3289 Other specified disorders of bladder: Secondary | ICD-10-CM | POA: Diagnosis not present

## 2018-08-13 DIAGNOSIS — I252 Old myocardial infarction: Secondary | ICD-10-CM

## 2018-08-13 DIAGNOSIS — M7989 Other specified soft tissue disorders: Secondary | ICD-10-CM | POA: Diagnosis not present

## 2018-08-13 DIAGNOSIS — R319 Hematuria, unspecified: Secondary | ICD-10-CM | POA: Diagnosis not present

## 2018-08-13 DIAGNOSIS — R601 Generalized edema: Secondary | ICD-10-CM | POA: Diagnosis not present

## 2018-08-13 DIAGNOSIS — Z87440 Personal history of urinary (tract) infections: Secondary | ICD-10-CM

## 2018-08-13 DIAGNOSIS — I82409 Acute embolism and thrombosis of unspecified deep veins of unspecified lower extremity: Secondary | ICD-10-CM

## 2018-08-13 DIAGNOSIS — R7989 Other specified abnormal findings of blood chemistry: Secondary | ICD-10-CM | POA: Diagnosis present

## 2018-08-13 DIAGNOSIS — C7800 Secondary malignant neoplasm of unspecified lung: Secondary | ICD-10-CM | POA: Diagnosis present

## 2018-08-13 DIAGNOSIS — C679 Malignant neoplasm of bladder, unspecified: Secondary | ICD-10-CM | POA: Diagnosis present

## 2018-08-13 DIAGNOSIS — T83198A Other mechanical complication of other urinary devices and implants, initial encounter: Secondary | ICD-10-CM | POA: Diagnosis not present

## 2018-08-13 DIAGNOSIS — N136 Pyonephrosis: Secondary | ICD-10-CM | POA: Diagnosis present

## 2018-08-13 DIAGNOSIS — R52 Pain, unspecified: Secondary | ICD-10-CM | POA: Diagnosis not present

## 2018-08-13 DIAGNOSIS — Z85831 Personal history of malignant neoplasm of soft tissue: Secondary | ICD-10-CM | POA: Diagnosis not present

## 2018-08-13 LAB — COMPREHENSIVE METABOLIC PANEL
ALT: 31 U/L (ref 0–44)
AST: 26 U/L (ref 15–41)
Albumin: 2.5 g/dL — ABNORMAL LOW (ref 3.5–5.0)
Alkaline Phosphatase: 115 U/L (ref 38–126)
Anion gap: 11 (ref 5–15)
BILIRUBIN TOTAL: 0.7 mg/dL (ref 0.3–1.2)
BUN: 13 mg/dL (ref 6–20)
CO2: 26 mmol/L (ref 22–32)
Calcium: 8.3 mg/dL — ABNORMAL LOW (ref 8.9–10.3)
Chloride: 101 mmol/L (ref 98–111)
Creatinine, Ser: 1.08 mg/dL (ref 0.61–1.24)
GFR calc Af Amer: >60 mL/min (ref >60)
GFR calc non Af Amer: >60 mL/min (ref >60)
Glucose, Bld: 117 mg/dL — ABNORMAL HIGH (ref 70–99)
Potassium: 3.8 mmol/L (ref 3.5–5.1)
Sodium: 138 mmol/L (ref 135–145)
Total Protein: 6.3 g/dL — ABNORMAL LOW (ref 6.5–8.1)

## 2018-08-13 LAB — CBC WITH DIFFERENTIAL/PLATELET
Abs Immature Granulocytes: 0.04 10*3/uL (ref 0.00–0.07)
BASOS ABS: 0.1 10*3/uL (ref 0.0–0.1)
Basophils Relative: 1 %
EOS PCT: 0 %
Eosinophils Absolute: 0 10*3/uL (ref 0.0–0.5)
HCT: 20.5 % — ABNORMAL LOW (ref 39.0–52.0)
Hemoglobin: 6.3 g/dL — CL (ref 13.0–17.0)
Immature Granulocytes: 0 %
Lymphocytes Relative: 16 %
Lymphs Abs: 1.5 10*3/uL (ref 0.7–4.0)
MCH: 31.8 pg (ref 26.0–34.0)
MCHC: 30.7 g/dL (ref 30.0–36.0)
MCV: 103.5 fL — ABNORMAL HIGH (ref 80.0–100.0)
Monocytes Absolute: 1.5 10*3/uL — ABNORMAL HIGH (ref 0.1–1.0)
Monocytes Relative: 16 %
Neutro Abs: 6.2 10*3/uL (ref 1.7–7.7)
Neutrophils Relative %: 67 %
Platelets: 370 10*3/uL (ref 150–400)
RBC: 1.98 MIL/uL — ABNORMAL LOW (ref 4.22–5.81)
RDW: 18.3 % — ABNORMAL HIGH (ref 11.5–15.5)
WBC: 9.4 10*3/uL (ref 4.0–10.5)
nRBC: 0.3 % — ABNORMAL HIGH (ref 0.0–0.2)

## 2018-08-13 LAB — TROPONIN I: Troponin I: 0.03 ng/mL (ref <0.03)

## 2018-08-13 LAB — BRAIN NATRIURETIC PEPTIDE: B Natriuretic Peptide: 43.3 pg/mL (ref 0.0–100.0)

## 2018-08-13 MED ORDER — ONDANSETRON HCL 4 MG/2ML IJ SOLN
4.0000 mg | Freq: Once | INTRAMUSCULAR | Status: AC
  Administered 2018-08-13: 4 mg via INTRAVENOUS
  Filled 2018-08-13: qty 2

## 2018-08-13 MED ORDER — SODIUM CHLORIDE 0.9 % IV SOLN
10.0000 mL/h | Freq: Once | INTRAVENOUS | Status: AC
  Administered 2018-08-13: 10 mL/h via INTRAVENOUS

## 2018-08-13 MED ORDER — ALPRAZOLAM 0.5 MG PO TABS
0.5000 mg | ORAL_TABLET | Freq: Once | ORAL | Status: AC
  Administered 2018-08-13: 0.5 mg via ORAL
  Filled 2018-08-13: qty 1

## 2018-08-13 MED ORDER — MORPHINE SULFATE (PF) 4 MG/ML IV SOLN
4.0000 mg | Freq: Once | INTRAVENOUS | Status: AC
  Administered 2018-08-13: 4 mg via INTRAVENOUS
  Filled 2018-08-13: qty 1

## 2018-08-13 NOTE — ED Triage Notes (Addendum)
Per EMS, patient from home, c/o swelling and pain to bilateral legs x2 days. Patient also reports pain to urethra from catheter. Hx prostate cancer. Last chemo 12/2.  BP 124/84 HR 98 O2 98%

## 2018-08-13 NOTE — ED Notes (Signed)
Patient transported to X-ray 

## 2018-08-13 NOTE — ED Provider Notes (Addendum)
[facility] DEPT Provider Note   CSN: [id number] Arrival date & time: 08/13/18  1739     History   Chief Complaint Chief Complaint  Patient presents with  . Leg Swelling    HPI Dylan Gonzalez is a 52 y.o. male.  HPI Patient presents with swelling in both his legs.  History of metastatic prostate cancer and has chronic pain with it.  States the pain is uncontrolled with his oxycodone at home.  Recent admission to the hospital.  Has had some blood in his chronic Foley catheter.  States he feels weak all over.  He is due for chemotherapy tomorrow.  He is on Xarelto for previous blood clots in his leg. Past Medical History:  Diagnosis Date  . CHF (congestive heart failure) ([place])   . Hypertension   . Malignant neoplasm metastatic to lung ([place]) 04/19/2018  . MI (myocardial infarction) ([place])    2007  . 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 Acute UTI (urinary tract infection) 08/02/2018  . Deep vein thrombosis (DVT) of left lower extremity ([place]) 08/02/2018  . Bilateral lower extremity edema 08/01/2018  . CAD (coronary artery disease) 08/01/2018  . Hypokalemia 08/01/2018  . Port-A-Cath in place 06/29/2018  . Goals of care, counseling/discussion 05/15/2018  . Bilateral ureteral obstruction 04/19/2018  . Malignant neoplasm metastatic to lung ([place]) 04/19/2018  . 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 Prostate neoplasm 04/13/2018  . Hypertension 12/26/2011  . 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 CYSTOSCOPY W/ URETERAL STENT PLACEMENT Bilateral 04/13/2018   Procedure: CYSTOSCOPY WITH BILATERAL STENT REPLACEMENT;  Surgeon: Irine Seal, MD;  Location: [facility];  Service: Urology;  Laterality: Bilateral;  . eye removed    . HERNIA REPAIR    . INTRAOCULAR PROSTHESES INSERTION      . IR IMAGING GUIDED PORT INSERTION  06/21/2018  . PROSTATE BIOPSY [name] 04/13/2018   Procedure: ULTRASOUND GUIDED PROSTATE BIOPSY;  Surgeon: Irine Seal, MD;  Location: [facility];  Service: Urology;  Laterality: [name];  . TOOTH EXTRACTION Right 04/21/2013   Procedure: EXTRACTION MOLARS;  Surgeon: [name], DDS;  Location: [facility];  Service: Oral Surgery;  Laterality: Right;  . TRANSURETHRAL RESECTION OF PROSTATE  04/13/2018   Procedure: TRANSURETHRAL RESECTION OF THE PROSTATE (TURP);  Surgeon: Irine Seal, MD;  [facility];  Service: Urology;;        Home Medications    Prior to Admission medications   Medication Sig Start Date End Date Taking? Authorizing Provider  albuterol (PROVENTIL HFA) 108 (90 Base) MCG/ACT inhaler Inhale 1-2 puffs into the lungs every 6 (six) hours as needed for wheezing or shortness of breath.   Yes [provider]  ALPRAZolam Duanne Moron) 0.5 MG tablet Take 0.5 mg by mouth 2 (two) times daily as needed for anxiety.  05/31/18  Yes [provider]  amoxicillin (AMOXIL) 500 MG capsule Take 1 capsule (500 mg total) by mouth 3 (three) times daily. 08/05/18  Yes Mikhail, Velta Addison, DO  bisacodyl (DULCOLAX) 10 MG suppository Place 1 suppository (10 mg total) rectally daily as needed for moderate constipation. 08/05/18  Yes Mikhail, [place], DO  Docusate Calcium (STOOL SOFTENER PO) Take 1 tablet by mouth daily as needed (constipation).    Yes [provider]  fluticasone (FLONASE) 50 MCG/ACT nasal spray PLACE 2 SPRAYS INTO  BOTH NOSTRILS DAILY. Patient taking differently: Place 2 sprays into both nostrils daily as needed for allergies.  07/22/14  Yes Le, Thao P, DO  hydrochlorothiazide (HYDRODIURIL) 25 MG tablet Take 25 mg by mouth daily.  05/02/18  Yes [provider]  hydrOXYzine (VISTARIL) 100 MG capsule TAKE ONE CAPSULE BY MOUTH AT BEDTIME AS NEEDED FOR ALLERGY Patient taking differently: Take 100 mg by mouth at bedtime as needed for itching.   10/12/15  Yes Robyn Haber, MD  lidocaine-prilocaine (EMLA) cream Apply 1 application topically as needed. Patient taking differently: Apply 1 application topically as needed (port access).  06/22/18  Yes [name], MD  metoprolol succinate (TOPROL-XL) 50 MG 24 hr tablet Take 50 mg by mouth daily. 05/17/18  Yes [provider]  ondansetron (ZOFRAN) 8 MG tablet Take 1 tablet (8 mg total) by mouth every 8 (eight) hours as needed for nausea or vomiting. 07/28/18  Yes [name], MD  oxyCODONE (OXY IR/ROXICODONE) 5 MG immediate release tablet Take 1-2 tablets (5-10 mg total) by mouth every 4 (four) hours as needed for severe pain. 07/25/18  Yes [name], MD  polyethylene glycol (MIRALAX / GLYCOLAX) packet Take 17 g by mouth 2 (two) times daily as needed for mild constipation or moderate constipation. 04/23/18  Yes Pollina, Gwenyth Allegra, MD  rivaroxaban (XARELTO) 20 MG TABS tablet Take 1 tablet (20 mg total) by mouth daily with supper. 07/17/18  Yes [name], MD  amLODipine (NORVASC) 10 MG tablet Take 1 tablet (10 mg total) by mouth daily. Patient not taking: Reported on 08/13/2018 04/19/18   Georgette Shell, MD    Family History Family History  Problem Relation Age of Onset  . Hypertension Father   . Depression Brother     Social History Social History   Tobacco Use  . Smoking status: Never Smoker  . Smokeless tobacco: Never Used  Substance Use Topics  . Alcohol use: No  . Drug use: No     Allergies   Pollen extract   Review of Systems Review of Systems  Constitutional: Positive for appetite change. Negative for fever.  HENT: Negative for congestion.   Respiratory: Negative for shortness of breath.   Cardiovascular: Positive for leg swelling.  Gastrointestinal: Positive for abdominal pain.  Genitourinary: Positive for hematuria.  Musculoskeletal: Positive for back pain.  Skin: Negative for rash.  Neurological: Positive for weakness.    Psychiatric/Behavioral: Negative for confusion.     Physical Exam Updated Vital Signs BP 124/86   Pulse (!) 105   Temp 98.3 F (36.8 C) (Oral)   Resp 20   SpO2 100%   Physical Exam  Constitutional: He appears well-developed.  HENT:  Head: Atraumatic.  Eyes: Pupils are equal, round, and reactive to light.  Cardiovascular:  Tachycardia  Pulmonary/Chest: Effort normal. He has no rales.  Abdominal: There is tenderness.  Lower abdominal tenderness without rebound or guarding.  Genitourinary:  Genitourinary Comments: Foley catheter in place.  Musculoskeletal: He exhibits edema.  Pitting edema bilateral lower extremities.  Also some anasarca.  Face appears to be somewhat swollen also.  Neurological: He is alert.  Skin: Skin is warm.  Nursing note and vitals reviewed.    ED Treatments / Results  Labs (all labs ordered are listed, but only abnormal results are displayed) Labs Reviewed  COMPREHENSIVE METABOLIC PANEL - Abnormal; Notable for the following components:      Result Value   Glucose, Bld 117 (*)    Calcium  8.3 (*)    Total Protein 6.3 (*)    Albumin 2.5 (*)    All other components within normal limits  TROPONIN I - Abnormal; Notable for the following components:   Troponin I 0.03 (*)    All other components within normal limits  CBC WITH DIFFERENTIAL/PLATELET - Abnormal; Notable for the following components:   RBC 1.98 (*)    Hemoglobin 6.3 (*)    HCT 20.5 (*)    MCV 103.5 (*)    RDW 18.3 (*)    nRBC 0.3 (*)    Monocytes Absolute 1.5 (*)    All other components within normal limits  BRAIN NATRIURETIC PEPTIDE    EKG EKG Interpretation  Date/Time:  Sunday August 13 2018 18:48:02 EST Ventricular Rate:  101 PR Interval:    QRS Duration: 83 QT Interval:  331 QTC Calculation: 429 R Axis:   48 Text Interpretation:  Sinus tachycardia Confirmed by Davonna Belling [phone]) on 08/13/2018 10:04:34 PM   Radiology Dg Chest 2 View  Result Date:  08/13/2018 CLINICAL DATA:  Swelling, bilateral leg pain, history of prostate cancer EXAM: CHEST - 2 VIEW COMPARISON:  08/04/2018 FINDINGS: Lungs are clear.  No pleural effusion or pneumothorax. The heart is normal in size. Right chest power port terminates in the lower SVC. Visualized osseous structures are within normal limits. IMPRESSION: Normal chest radiographs. Electronically Signed   By: Julian Hy M.D.   On: 08/13/2018 19:35    Procedures Procedures (including critical care time)  Medications Ordered in ED Medications  ALPRAZolam (XANAX) tablet 0.5 mg (has no administration in time range)  morphine 4 MG/ML injection 4 mg (4 mg Intravenous Given 08/13/18 1919)  ondansetron (ZOFRAN) injection 4 mg (4 mg Intravenous Given 08/13/18 1955)  morphine 4 MG/ML injection 4 mg (4 mg Intravenous Given 08/13/18 2304)     Initial Impression / Assessment and Plan / ED Course  I have reviewed the triage vital signs and the nursing notes.  Pertinent labs & imaging results that were available during my care of the patient were reviewed by me and considered in my medical decision making (see chart for details).     Patient with swelling of his lower extremities.  Has had work-up for this and likely secondary to his low albumin.  However also is been feeling weaker.  Hemoglobin now 6.3.  States he is been bleeding in his urine.  Has had recent transfusions.  With hemoglobin is 6 I feel patient benefit from admission to the hospital for transfusion and further work-up.  Also may need adjustment of his pain medicines since he is not controlled at home.  Also need management of his fluid status.  CRITICAL CARE Performed by: Davonna Belling Total critical care time: 30 minutes Critical care time was exclusive of separately billable procedures and treating other patients. Critical care was necessary to treat or prevent imminent or life-threatening deterioration. Critical care was time spent  personally by me on the following activities: development of treatment plan with patient and/or surrogate as well as nursing, discussions with consultants, evaluation of patient's response to treatment, examination of patient, obtaining history from patient or surrogate, ordering and performing treatments and interventions, ordering and review of laboratory studies, ordering and review of radiographic studies, pulse oximetry and re-evaluation of patient's condition.  Final Clinical Impressions(s) / ED Diagnoses   Final diagnoses:  Symptomatic anemia  Metastatic cancer ([place])  [place]    ED Discharge Orders    None  Davonna Belling, MD 08/13/18 7062    Davonna Belling, MD 09/18/18 1459

## 2018-08-13 NOTE — ED Notes (Signed)
Bed: WA08 Expected date:  Expected time:  Means of arrival:  Comments: EMS-edema

## 2018-08-13 NOTE — ED Notes (Signed)
ED Provider at bedside. 

## 2018-08-13 NOTE — ED Notes (Signed)
Date and time results received: 08/13/18 1950 (use smartphrase ".now" to insert current time)  Test: HGB Critical Value: 6.3  Name of Provider Notified: Dr. Alvino Chapel  Orders Received? Or Actions Taken?: Actions Taken: reported HGB to Dr Alvino Chapel

## 2018-08-14 ENCOUNTER — Inpatient Hospital Stay: Payer: BLUE CROSS/BLUE SHIELD

## 2018-08-14 ENCOUNTER — Encounter (HOSPITAL_COMMUNITY): Payer: BLUE CROSS/BLUE SHIELD

## 2018-08-14 ENCOUNTER — Other Ambulatory Visit: Payer: Self-pay

## 2018-08-14 ENCOUNTER — Inpatient Hospital Stay (HOSPITAL_COMMUNITY): Payer: BLUE CROSS/BLUE SHIELD

## 2018-08-14 ENCOUNTER — Encounter (HOSPITAL_COMMUNITY): Payer: Self-pay | Admitting: Internal Medicine

## 2018-08-14 DIAGNOSIS — Z85831 Personal history of malignant neoplasm of soft tissue: Secondary | ICD-10-CM | POA: Diagnosis not present

## 2018-08-14 DIAGNOSIS — I252 Old myocardial infarction: Secondary | ICD-10-CM | POA: Diagnosis not present

## 2018-08-14 DIAGNOSIS — I82402 Acute embolism and thrombosis of unspecified deep veins of left lower extremity: Secondary | ICD-10-CM | POA: Diagnosis not present

## 2018-08-14 DIAGNOSIS — Z85840 Personal history of malignant neoplasm of eye: Secondary | ICD-10-CM | POA: Diagnosis not present

## 2018-08-14 DIAGNOSIS — Z87440 Personal history of urinary (tract) infections: Secondary | ICD-10-CM | POA: Diagnosis not present

## 2018-08-14 DIAGNOSIS — N136 Pyonephrosis: Secondary | ICD-10-CM | POA: Diagnosis present

## 2018-08-14 DIAGNOSIS — D649 Anemia, unspecified: Secondary | ICD-10-CM | POA: Diagnosis present

## 2018-08-14 DIAGNOSIS — Z7901 Long term (current) use of anticoagulants: Secondary | ICD-10-CM | POA: Diagnosis not present

## 2018-08-14 DIAGNOSIS — Z9079 Acquired absence of other genital organ(s): Secondary | ICD-10-CM | POA: Diagnosis not present

## 2018-08-14 DIAGNOSIS — C7800 Secondary malignant neoplasm of unspecified lung: Secondary | ICD-10-CM | POA: Diagnosis present

## 2018-08-14 DIAGNOSIS — Z8249 Family history of ischemic heart disease and other diseases of the circulatory system: Secondary | ICD-10-CM | POA: Diagnosis not present

## 2018-08-14 DIAGNOSIS — Z79899 Other long term (current) drug therapy: Secondary | ICD-10-CM | POA: Diagnosis not present

## 2018-08-14 DIAGNOSIS — Z792 Long term (current) use of antibiotics: Secondary | ICD-10-CM | POA: Diagnosis not present

## 2018-08-14 DIAGNOSIS — G8929 Other chronic pain: Secondary | ICD-10-CM | POA: Diagnosis present

## 2018-08-14 DIAGNOSIS — R600 Localized edema: Secondary | ICD-10-CM

## 2018-08-14 DIAGNOSIS — Z79891 Long term (current) use of opiate analgesic: Secondary | ICD-10-CM | POA: Diagnosis not present

## 2018-08-14 DIAGNOSIS — I110 Hypertensive heart disease with heart failure: Secondary | ICD-10-CM | POA: Diagnosis present

## 2018-08-14 DIAGNOSIS — N029 Recurrent and persistent hematuria with unspecified morphologic changes: Secondary | ICD-10-CM | POA: Diagnosis present

## 2018-08-14 DIAGNOSIS — I82432 Acute embolism and thrombosis of left popliteal vein: Secondary | ICD-10-CM | POA: Diagnosis present

## 2018-08-14 DIAGNOSIS — I5033 Acute on chronic diastolic (congestive) heart failure: Secondary | ICD-10-CM | POA: Diagnosis present

## 2018-08-14 DIAGNOSIS — Z91048 Other nonmedicinal substance allergy status: Secondary | ICD-10-CM | POA: Diagnosis not present

## 2018-08-14 DIAGNOSIS — D62 Acute posthemorrhagic anemia: Secondary | ICD-10-CM | POA: Diagnosis present

## 2018-08-14 DIAGNOSIS — N3289 Other specified disorders of bladder: Secondary | ICD-10-CM | POA: Diagnosis not present

## 2018-08-14 DIAGNOSIS — R319 Hematuria, unspecified: Secondary | ICD-10-CM | POA: Diagnosis not present

## 2018-08-14 DIAGNOSIS — R7989 Other specified abnormal findings of blood chemistry: Secondary | ICD-10-CM | POA: Diagnosis present

## 2018-08-14 DIAGNOSIS — Z9001 Acquired absence of eye: Secondary | ICD-10-CM | POA: Diagnosis not present

## 2018-08-14 DIAGNOSIS — Z818 Family history of other mental and behavioral disorders: Secondary | ICD-10-CM | POA: Diagnosis not present

## 2018-08-14 DIAGNOSIS — R601 Generalized edema: Secondary | ICD-10-CM | POA: Diagnosis present

## 2018-08-14 DIAGNOSIS — I2510 Atherosclerotic heart disease of native coronary artery without angina pectoris: Secondary | ICD-10-CM | POA: Diagnosis present

## 2018-08-14 DIAGNOSIS — C495 Malignant neoplasm of connective and soft tissue of pelvis: Secondary | ICD-10-CM | POA: Diagnosis present

## 2018-08-14 LAB — URINALYSIS, ROUTINE W REFLEX MICROSCOPIC
Bacteria, UA: NONE SEEN
Bilirubin Urine: NEGATIVE
Glucose, UA: NEGATIVE mg/dL
Ketones, ur: NEGATIVE mg/dL
NITRITE: NEGATIVE
Protein, ur: 100 mg/dL — AB
RBC / HPF: >50 RBC/hpf — ABNORMAL HIGH (ref 0–5)
Specific Gravity, Urine: 1.012 (ref 1.005–1.030)
pH: 6 (ref 5.0–8.0)

## 2018-08-14 LAB — TROPONIN I: Troponin I: 0.04 ng/mL (ref <0.03)

## 2018-08-14 LAB — ABO/RH: ABO/RH(D): O POS

## 2018-08-14 LAB — PREPARE RBC (CROSSMATCH)

## 2018-08-14 MED ORDER — SODIUM CHLORIDE 0.9 % IV SOLN
2.0000 g | Freq: Once | INTRAVENOUS | Status: AC
  Administered 2018-08-14: 2 g via INTRAVENOUS
  Filled 2018-08-14: qty 2

## 2018-08-14 MED ORDER — ALBUTEROL SULFATE (2.5 MG/3ML) 0.083% IN NEBU: 2.5000 mg | INHALATION_SOLUTION | Freq: Four times a day (QID) | RESPIRATORY_TRACT | Status: DC | PRN

## 2018-08-14 MED ORDER — ACETAMINOPHEN 650 MG RE SUPP: 650.0000 mg | Freq: Four times a day (QID) | RECTAL | Status: DC | PRN

## 2018-08-14 MED ORDER — FUROSEMIDE 10 MG/ML IJ SOLN
40.0000 mg | Freq: Once | INTRAMUSCULAR | Status: AC
  Administered 2018-08-14: 40 mg via INTRAVENOUS
  Filled 2018-08-14: qty 4

## 2018-08-14 MED ORDER — HYDROCHLOROTHIAZIDE 25 MG PO TABS
25.0000 mg | ORAL_TABLET | Freq: Every day | ORAL | Status: DC
  Administered 2018-08-14 – 2018-08-18 (×5): 25 mg via ORAL
  Filled 2018-08-14 (×5): qty 1

## 2018-08-14 MED ORDER — SODIUM CHLORIDE 0.9 % IV SOLN
1.0000 g | INTRAVENOUS | Status: DC
  Administered 2018-08-14: 1 g via INTRAVENOUS
  Filled 2018-08-14: qty 10
  Filled 2018-08-14: qty 1

## 2018-08-14 MED ORDER — HYDROXYZINE HCL 50 MG PO TABS
100.0000 mg | ORAL_TABLET | Freq: Every evening | ORAL | Status: DC | PRN
  Filled 2018-08-14: qty 2

## 2018-08-14 MED ORDER — MORPHINE SULFATE (PF) 2 MG/ML IV SOLN
2.0000 mg | INTRAVENOUS | Status: DC | PRN
  Administered 2018-08-14: 2 mg via INTRAVENOUS
  Filled 2018-08-14: qty 1

## 2018-08-14 MED ORDER — ACETAMINOPHEN 325 MG PO TABS: 650.0000 mg | ORAL_TABLET | Freq: Four times a day (QID) | ORAL | Status: DC | PRN

## 2018-08-14 MED ORDER — ONDANSETRON HCL 4 MG/2ML IJ SOLN: 4.0000 mg | Freq: Four times a day (QID) | INTRAMUSCULAR | Status: DC | PRN

## 2018-08-14 MED ORDER — ALPRAZOLAM 0.5 MG PO TABS
0.5000 mg | ORAL_TABLET | Freq: Two times a day (BID) | ORAL | Status: DC | PRN
  Administered 2018-08-14 – 2018-08-17 (×6): 0.5 mg via ORAL
  Filled 2018-08-14 (×6): qty 1

## 2018-08-14 MED ORDER — SODIUM CHLORIDE (PF) 0.9 % IJ SOLN
INTRAMUSCULAR | Status: AC
  Filled 2018-08-14: qty 200

## 2018-08-14 MED ORDER — BISACODYL 10 MG RE SUPP: 10.0000 mg | Freq: Every day | RECTAL | Status: DC | PRN

## 2018-08-14 MED ORDER — SODIUM CHLORIDE 0.9 % IV SOLN
2.0000 g | Freq: Two times a day (BID) | INTRAVENOUS | Status: DC
  Administered 2018-08-15 – 2018-08-18 (×8): 2 g via INTRAVENOUS
  Filled 2018-08-14 (×8): qty 2

## 2018-08-14 MED ORDER — FLUTICASONE PROPIONATE 50 MCG/ACT NA SUSP
2.0000 | Freq: Every day | NASAL | Status: DC | PRN
  Filled 2018-08-14: qty 16

## 2018-08-14 MED ORDER — POLYETHYLENE GLYCOL 3350 17 G PO PACK
17.0000 g | PACK | Freq: Two times a day (BID) | ORAL | Status: DC | PRN
  Administered 2018-08-16 – 2018-08-17 (×3): 17 g via ORAL
  Filled 2018-08-14 (×3): qty 1

## 2018-08-14 MED ORDER — DOCUSATE SODIUM 100 MG PO CAPS
100.0000 mg | ORAL_CAPSULE | Freq: Every day | ORAL | Status: DC | PRN
  Administered 2018-08-16 – 2018-08-17 (×2): 100 mg via ORAL
  Filled 2018-08-14 (×2): qty 1

## 2018-08-14 MED ORDER — RIVAROXABAN 20 MG PO TABS
20.0000 mg | ORAL_TABLET | Freq: Every day | ORAL | Status: DC
  Administered 2018-08-14: 20 mg via ORAL
  Filled 2018-08-14: qty 1

## 2018-08-14 MED ORDER — IOHEXOL 300 MG/ML  SOLN
125.0000 mL | Freq: Once | INTRAMUSCULAR | Status: AC | PRN
  Administered 2018-08-14: 125 mL via INTRAVENOUS

## 2018-08-14 MED ORDER — OXYCODONE HCL 5 MG PO TABS
5.0000 mg | ORAL_TABLET | ORAL | Status: DC | PRN
  Administered 2018-08-14 – 2018-08-17 (×14): 10 mg via ORAL
  Administered 2018-08-17: 5 mg via ORAL
  Administered 2018-08-17 – 2018-08-18 (×5): 10 mg via ORAL
  Filled 2018-08-14 (×20): qty 2

## 2018-08-14 MED ORDER — SODIUM CHLORIDE 0.9% FLUSH
10.0000 mL | INTRAVENOUS | Status: DC | PRN
  Administered 2018-08-14 (×2): 10 mL
  Administered 2018-08-16: 20 mL
  Filled 2018-08-14 (×2): qty 40

## 2018-08-14 MED ORDER — METOPROLOL SUCCINATE ER 50 MG PO TB24
50.0000 mg | ORAL_TABLET | Freq: Every day | ORAL | Status: DC
  Administered 2018-08-14 – 2018-08-18 (×5): 50 mg via ORAL
  Filled 2018-08-14 (×5): qty 1

## 2018-08-14 MED ORDER — ONDANSETRON HCL 4 MG PO TABS: 4.0000 mg | ORAL_TABLET | Freq: Four times a day (QID) | ORAL | Status: DC | PRN

## 2018-08-14 NOTE — Progress Notes (Signed)
Pharmacy Antibiotic Note  Dylan Gonzalez is a 52 y.o. male admitted on 08/13/2018 with UTI.  Pharmacy has been consulted for Cefepime dosing. PMH of stent placement 04/2018 and MSSA UTI 08/01/18 treated with vanc/cefepime x2d then transitioned to Amoxicillin for discharge.  12/8 SCr 1.08 WBC 9.4  Plan: Cefepime 2g IV q12h Follow up renal fxn, culture results, and clinical course. F/u ability to de-escalate antibiotics.      Temp (24hrs), Avg:98.9 F (37.2 C), Min:98.1 F (36.7 C), Max:99.5 F (37.5 C)  Recent Labs  Lab 08/13/18 1845  WBC 9.4  CREATININE 1.08    Estimated Creatinine Clearance: 96 mL/min (by C-G formula based on SCr of 1.08 mg/dL).    Allergies  Allergen Reactions  . Pollen Extract Other (See Comments)    Sneezing and shortness of breath    Antimicrobials this admission: 12/9 Ceftriaxone  >>  12/9 Cefepime >>   Dose adjustments this admission:   Microbiology results: Previous admission: 11/26 UCx: >100k MSSA (pan-sens) Current admission: 12/9 UCx:   Thank you for allowing pharmacy to be a part of this patient's care.  Gretta Arab PharmD, BCPS Pager [phone] 08/14/2018 3:28 PM

## 2018-08-14 NOTE — H&P (Signed)
History and Physical    Dylan Gonzalez TIW:[id number] DOB: Sep 02, 1966 DOA: 08/13/2018  PCP: Donald Prose, MD  Patient coming from: Home.  Chief Complaint: Lower extremity edema and pain.  HPI: Dylan Gonzalez is a 52 y.o. 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  Denies any fever chills nausea vomiting or diarrhea.  Has been compliant with his Xarelto for DVT.  During recent admission Dopplers did not show any DVT.  Patient's lower extremity edema was attributed to hypoalbuminemia and also amlodipine.  ED Course: In the ER on exam patient's hemoglobin is around 6.3 and has edema in both lower extremities more on the left side.  2 units of PRBC transfusion has been ordered.  Admitted for further management.  Review of Systems: As per HPI, rest all negative.   Past Medical History:  Diagnosis Date  . CHF (congestive heart failure) ([place])   . Hypertension   . Malignant neoplasm metastatic to lung ([place]) 04/19/2018  . MI (myocardial infarction) ([place])    2007  . 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 CYSTOSCOPY W/ URETERAL STENT PLACEMENT Bilateral 04/13/2018   Procedure: CYSTOSCOPY WITH BILATERAL STENT REPLACEMENT;  Surgeon: Irine Seal, MD;  [facility];  Service: Urology;  Laterality: Bilateral;  . eye removed    . HERNIA REPAIR    . INTRAOCULAR PROSTHESES INSERTION    . IR IMAGING GUIDED PORT INSERTION  06/21/2018  . PROSTATE BIOPSY [name] 04/13/2018   Procedure: ULTRASOUND GUIDED PROSTATE BIOPSY;  Surgeon: Irine Seal, MD;   [facility];  Service: Urology;  Laterality: [name];  . TOOTH EXTRACTION Right 04/21/2013   Procedure: EXTRACTION MOLARS;  Surgeon: [name], DDS;  Location: [facility];  Service: Oral Surgery;  Laterality: Right;  . TRANSURETHRAL RESECTION OF PROSTATE  04/13/2018   Procedure: TRANSURETHRAL RESECTION OF THE PROSTATE (TURP);  Surgeon: Irine Seal, MD;  [facility];  Service: Urology;;     reports that he has never smoked. He has never used smokeless tobacco. He reports that he does not drink alcohol or use drugs.  Allergies  Allergen Reactions  . Pollen Extract Other (See Comments)    Sneezing and shortness of breath    Family History  Problem Relation Age of Onset  . Hypertension Father   . Depression Brother     Prior to Admission medications   Medication Sig Start Date End Date Taking? Authorizing Provider  albuterol (PROVENTIL HFA) 108 (90 Base) MCG/ACT inhaler Inhale 1-2 puffs into the lungs every 6 (six) hours as needed for wheezing or shortness of breath.   Yes [provider]  ALPRAZolam Duanne Moron) 0.5 MG tablet Take 0.5 mg by mouth 2 (two) times daily as needed for anxiety.  05/31/18  Yes [provider]  amoxicillin (AMOXIL) 500 MG capsule Take 1 capsule (500 mg total) by mouth 3 (three) times daily. 08/05/18  Yes Mikhail, Velta Addison, DO  bisacodyl (DULCOLAX) 10 MG suppository Place 1 suppository (10 mg total) rectally daily as needed for moderate constipation. 08/05/18  Yes Mikhail, [place], DO  Docusate Calcium (  STOOL SOFTENER PO) Take 1 tablet by mouth daily as needed (constipation).    Yes [provider]  fluticasone (FLONASE) 50 MCG/ACT nasal spray PLACE 2 SPRAYS INTO BOTH NOSTRILS DAILY. Patient taking differently: Place 2 sprays into both nostrils daily as needed for allergies.  07/22/14  Yes Le, Thao P, DO  hydrochlorothiazide (HYDRODIURIL) 25 MG tablet Take 25 mg by mouth daily.  05/02/18  Yes [provider]  hydrOXYzine (VISTARIL)  100 MG capsule TAKE ONE CAPSULE BY MOUTH AT BEDTIME AS NEEDED FOR ALLERGY Patient taking differently: Take 100 mg by mouth at bedtime as needed for itching.  10/12/15  Yes Robyn Haber, MD  lidocaine-prilocaine (EMLA) cream Apply 1 application topically as needed. Patient taking differently: Apply 1 application topically as needed (port access).  06/22/18  Yes [name], MD  metoprolol succinate (TOPROL-XL) 50 MG 24 hr tablet Take 50 mg by mouth daily. 05/17/18  Yes [provider]  ondansetron (ZOFRAN) 8 MG tablet Take 1 tablet (8 mg total) by mouth every 8 (eight) hours as needed for nausea or vomiting. 07/28/18  Yes [name], MD  oxyCODONE (OXY IR/ROXICODONE) 5 MG immediate release tablet Take 1-2 tablets (5-10 mg total) by mouth every 4 (four) hours as needed for severe pain. 07/25/18  Yes [name], MD  polyethylene glycol (MIRALAX / GLYCOLAX) packet Take 17 g by mouth 2 (two) times daily as needed for mild constipation or moderate constipation. 04/23/18  Yes Pollina, Gwenyth Allegra, MD  rivaroxaban (XARELTO) 20 MG TABS tablet Take 1 tablet (20 mg total) by mouth daily with supper. 07/17/18  Yes [name], MD  amLODipine (NORVASC) 10 MG tablet Take 1 tablet (10 mg total) by mouth daily. Patient not taking: Reported on 08/13/2018 04/19/18   Georgette Shell, MD    Physical Exam: Vitals:   08/13/18 2208 08/13/18 2334 08/14/18 0000 08/14/18 0122  BP: 124/86 139/75 131/78 121/76  Pulse: (!) 105 89 (!) 102 (!) 105  Resp: 20 18 (!) 23 20  Temp:    99.2 F (37.3 C)  TempSrc:    Oral  SpO2: 100% 98% 99% 97%      Constitutional: Moderately built and nourished. Vitals:   08/13/18 2208 08/13/18 2334 08/14/18 0000 08/14/18 0122  BP: 124/86 139/75 131/78 121/76  Pulse: (!) 105 89 (!) 102 (!) 105  Resp: 20 18 (!) 23 20  Temp:    99.2 F (37.3 C)  TempSrc:    Oral  SpO2: 100% 98% 99% 97%   Eyes: Anicteric no pallor.  Left eye enucleated. ENMT: No  discharge from the ears eyes nose or mouth. Neck: No mass felt.  No neck rigidity.   Respiratory: No rhonchi or crepitations. Cardiovascular: S1-S2 heard. Abdomen: Soft nontender bowel sounds present. Musculoskeletal: Bilateral lower extremity edema more the left side. Skin: No rash. Neurologic: Alert awake oriented to time place and person.  Moves all extremities. Psychiatric: Appears normal.   Labs on Admission: I have personally reviewed following labs and imaging studies  CBC: Recent Labs  Lab 08/07/18 0921 08/13/18 1845  WBC 25.5* 9.4  NEUTROABS 21.4* 6.2  HGB 8.2* 6.3*  HCT 25.1* 20.5*  MCV 97.7 103.5*  PLT 498* 740   Basic Metabolic Panel: Recent Labs  Lab 08/07/18 0921 08/13/18 1845  NA 140 138  K 4.6 3.8  CL 102 101  CO2 27 26  GLUCOSE 109* 117*  BUN 15 13  CREATININE 1.29* 1.08  CALCIUM 9.2 8.3*  GFR: Estimated Creatinine Clearance: 96 mL/min (by C-G formula based on SCr of 1.08 mg/dL). Liver Function Tests: Recent Labs  Lab 08/07/18 0921 08/13/18 1845  AST 43* 26  ALT 45* 31  ALKPHOS 142* 115  BILITOT 0.5 0.7  PROT 6.7 6.3*  ALBUMIN 2.7* 2.5*   No results for input(s): LIPASE, AMYLASE in the last 168 hours. No results for input(s): AMMONIA in the last 168 hours. Coagulation Profile: No results for input(s): INR, PROTIME in the last 168 hours. Cardiac Enzymes: Recent Labs  Lab 08/13/18 1845  TROPONINI 0.03*   BNP (last 3 results) No results for input(s): PROBNP in the last 8760 hours. HbA1C: No results for input(s): HGBA1C in the last 72 hours. CBG: No results for input(s): GLUCAP in the last 168 hours. Lipid Profile: No results for input(s): CHOL, HDL, LDLCALC, TRIG, CHOLHDL, LDLDIRECT in the last 72 hours. Thyroid Function Tests: No results for input(s): TSH, T4TOTAL, FREET4, T3FREE, THYROIDAB in the last 72 hours. Anemia Panel: No results for input(s): VITAMINB12, FOLATE, FERRITIN, TIBC, IRON, RETICCTPCT in the last 72  hours. Urine analysis:    Component Value Date/Time   COLORURINE YELLOW 08/13/2018 2343   APPEARANCEUR HAZY (A) 08/13/2018 2343   LABSPEC 1.012 08/13/2018 2343   PHURINE 6.0 08/13/2018 2343   GLUCOSEU NEGATIVE 08/13/2018 2343   HGBUR LARGE (A) 08/13/2018 2343   BILIRUBINUR NEGATIVE 08/13/2018 2343   BILIRUBINUR neg 06/01/2014 1333   KETONESUR NEGATIVE 08/13/2018 2343   PROTEINUR 100 (A) 08/13/2018 2343   UROBILINOGEN 0.2 04/19/2015 0907   NITRITE NEGATIVE 08/13/2018 2343   LEUKOCYTESUR TRACE (A) 08/13/2018 2343   Sepsis Labs: @LABRCNTIP (procalcitonin:4,lacticidven:4) )No results found for this or any previous visit (from the past 240 hour(s)).   Radiological Exams on Admission: Dg Chest 2 View  Result Date: 08/13/2018 CLINICAL DATA:  Swelling, bilateral leg pain, history of prostate cancer EXAM: CHEST - 2 VIEW COMPARISON:  08/04/2018 FINDINGS: Lungs are clear.  No pleural effusion or pneumothorax. The heart is normal in size. Right chest power port terminates in the lower SVC. Visualized osseous structures are within normal limits. IMPRESSION: Normal chest radiographs. Electronically Signed   By: Julian Hy M.D.   On: 08/13/2018 19:35    EKG: Independently reviewed.  Sinus tachycardia.  Assessment/Plan Principal Problem:   Symptomatic anemia Active Problems:   Primary leiomyosarcoma of intra-abdominal site ([facility])   Bilateral lower extremity edema   CAD (coronary artery disease)   Deep vein thrombosis (DVT) of left lower extremity ([facility])   Anemia    1. Worsening anemia likely could be from hematuria and also patient's known malignancy and receiving chemotherapy.  2 units of PRBC transfusion has been ordered follow CBC. 2. Bilateral lower extremity edema worse on the left side with complaints of pain -pain could be from the malignancy.  Since patient is having persistent swelling may discuss with patient oncologist to see if we may need to rule out may Thurner syndrome  given history of DVT. 3. Lower extremity DVT on Xarelto.  Patient states his hematuria has decreased from recent. 4. Hypertension on hydrochlorothiazide amlodipine and metoprolol. 5. Elevated troponin with history of CAD -recheck troponin after transfusion. 6. Sarcoma of the prostate being followed by Dr. Alen Blew. 7. Urinary obstruction with chronic indwelling catheter.   DVT prophylaxis: Xarelto. Code Status: Full code. Family Communication: Patient's wife. Disposition Plan: Home. Consults called: None. Admission status: Observation.   Rise Patience MD [facility] Pager [phone].  If 7PM-7AM, please contact night-coverage  [email or web address] Password [email or web address]  08/14/2018, 2:30 AM

## 2018-08-14 NOTE — Progress Notes (Signed)
PROGRESS NOTE    Dylan Gonzalez  UQJ:[id number] DOB: 03/07/1966 DOA: 08/13/2018 PCP: Donald Prose, MD      Brief Narrative:  Dylan Gonzalez is a 52 y.o. 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  Admitted for transfusion, further workup.  Of note: Aug 2019: Admitted for prostate biopsy, required stent placement due to bulky tumor burden -->biopsy showed leiomyosarcaoma Jun 23, 2018: Seen in [place] office, US showed DVT, started Xarelto --> to ER that night with gross hematuria, discharged with Abx for UTI Jun 27, 2018: Again hematuria, evaluated by Urology, thought still to be due to UTI, anticoagulation continued, close follow up arranged Jul 29 2018: Hgb <7 g/dL at Oncology clinic, transfused 2 units Aug 01, 2018: Admitted for fever, vomiting, diagnosed with UTI, treated 3 days vanc/cefepime and idscharged on amoxicillin for MSSA UTI      Assessment & Plan:  Acute blood loss anemia Gross hematuria Gross hematuria in patient on Xarelto, recurrent UTI, advanced bulky sarcoma of pelvis, and ?stents. -Stop Xarelto -Transfuse 2 units -Have discussed with Urology, may need Perc neph tubes if tumor burden not improved  Possible UTI -Continue ceftriaxone  Leg swelling   -Lasix IV -Strict I/Os, daily weights, daily BMP  Metastatic sarcoma -Consult Oncology, appreciate cares  Recent lower extremity DVT -Hold Xarelto -Obtain US dopplers -May need filter  Hypertension -Continue metoprolol, HCTZ  Elevated troponin Doubt ACS        DVT prophylaxis: SCDs Code Status: FULL Family Communication: NOne present MDM and disposition Plan: This is a no charge note.  For further details, please see H&P by my partner Dr. Hal Hope from earlier today.  The below labs and imaging reports were reviewed and summarized above.    The patient was admitted with hematuria and acute bood loss  anemia    Objective: Vitals:   08/14/18 0859 08/14/18 0938 08/14/18 1228 08/14/18 1340  BP: 122/88 131/88 123/87 103/64  Pulse: (!) 104 99 93 99  Resp: (!) _0 (!) 22  Temp: 99.1 F (37.3 C) 99.5 F (37.5 C) 99 F (37.2 C) 99.3 F (37.4 C)  TempSrc: Oral Oral Oral Oral  SpO2: 100% 98% 99% 99%    Intake/Output Summary (Last 24 hours) at 08/14/2018 1421 Last data filed at 08/14/2018 0900 Gross per 24 hour  Intake 430 ml  Output 900 ml  Net -470 ml   There were no vitals filed for this visit.  Examination: The patient was seen and examined.      Data Reviewed: I have personally reviewed following labs and imaging studies:  CBC: Recent Labs  Lab 08/13/18 1845  WBC 9.4  NEUTROABS 6.2  HGB 6.3*  HCT 20.5*  MCV 103.5*  PLT 389   Basic Metabolic Panel: Recent Labs  Lab 08/13/18 1845  NA 138  K 3.8  CL 101  CO2 26  GLUCOSE 117*  BUN 13  CREATININE 1.08  CALCIUM 8.3*   GFR: Estimated Creatinine Clearance: 96 mL/min (by C-G formula based on SCr of 1.08 mg/dL). Liver Function Tests: Recent Labs  Lab 08/13/18 1845  AST 26  ALT 31  ALKPHOS 115  BILITOT 0.7  PROT 6.3*  ALBUMIN 2.5*   No results for input(s): LIPASE, AMYLASE in the last 168 hours. No results for input(s): AMMONIA in the last 168 hours. Coagulation Profile: No results for input(s): INR, PROTIME in the  last 168 hours. Cardiac Enzymes: Recent Labs  Lab 08/13/18 1845  TROPONINI 0.03*   BNP (last 3 results) No results for input(s): PROBNP in the last 8760 hours. HbA1C: No results for input(s): HGBA1C in the last 72 hours. CBG: No results for input(s): GLUCAP in the last 168 hours. Lipid Profile: No results for input(s): CHOL, HDL, LDLCALC, TRIG, CHOLHDL, LDLDIRECT in the last 72 hours. Thyroid Function Tests: No results for input(s): TSH, T4TOTAL, FREET4, T3FREE, THYROIDAB in the last 72 hours. Anemia Panel: No results for input(s): VITAMINB12, FOLATE, FERRITIN, TIBC, IRON,  RETICCTPCT in the last 72 hours. Urine analysis:    Component Value Date/Time   COLORURINE YELLOW 08/13/2018 2343   APPEARANCEUR HAZY (A) 08/13/2018 2343   LABSPEC 1.012 08/13/2018 2343   PHURINE 6.0 08/13/2018 2343   GLUCOSEU NEGATIVE 08/13/2018 2343   HGBUR LARGE (A) 08/13/2018 2343   BILIRUBINUR NEGATIVE 08/13/2018 2343   BILIRUBINUR neg 06/01/2014 1333   KETONESUR NEGATIVE 08/13/2018 2343   PROTEINUR 100 (A) 08/13/2018 2343   UROBILINOGEN 0.2 04/19/2015 0907   NITRITE NEGATIVE 08/13/2018 2343   LEUKOCYTESUR TRACE (A) 08/13/2018 2343   Sepsis Labs: _0 (procalcitonin:4,lacticacidven:4)  )No results found for this or any previous visit (from the past 240 hour(s)).       Radiology Studies: Dg Chest 2 View  Result Date: 08/13/2018 CLINICAL DATA:  Swelling, bilateral leg pain, history of prostate cancer EXAM: CHEST - 2 VIEW COMPARISON:  08/04/2018 FINDINGS: Lungs are clear.  No pleural effusion or pneumothorax. The heart is normal in size. Right chest power port terminates in the lower SVC. Visualized osseous structures are within normal limits. IMPRESSION: Normal chest radiographs. Electronically Signed   By: Julian Hy M.D.   On: 08/13/2018 19:35        Scheduled Meds: . furosemide  40 mg Intravenous Once  . hydrochlorothiazide  25 mg Oral Daily  . metoprolol succinate  50 mg Oral Daily   Continuous Infusions: . cefTRIAXone (ROCEPHIN)  IV 1 g (08/14/18 0314)     LOS: 0 days    Time spent: 25 minutes    Edwin Dada, MD [facility] 08/14/2018, 2:21 PM     Pager [phone] --- please page though AMION:  [email or web address] Password [email or web address] If 7PM-7AM, please contact night-coverage

## 2018-08-15 ENCOUNTER — Inpatient Hospital Stay (HOSPITAL_COMMUNITY): Payer: BLUE CROSS/BLUE SHIELD

## 2018-08-15 DIAGNOSIS — R609 Edema, unspecified: Secondary | ICD-10-CM

## 2018-08-15 DIAGNOSIS — I2583 Coronary atherosclerosis due to lipid rich plaque: Secondary | ICD-10-CM

## 2018-08-15 DIAGNOSIS — D62 Acute posthemorrhagic anemia: Secondary | ICD-10-CM

## 2018-08-15 DIAGNOSIS — I2510 Atherosclerotic heart disease of native coronary artery without angina pectoris: Secondary | ICD-10-CM

## 2018-08-15 LAB — TYPE AND SCREEN
ABO/RH(D): O POS
Antibody Screen: NEGATIVE
Unit division: 0
Unit division: 0

## 2018-08-15 LAB — BPAM RBC
Blood Product Expiration Date: 202001022359
Blood Product Expiration Date: 202001042359
ISSUE DATE / TIME: 201912090424
ISSUE DATE / TIME: 201912090910
Unit Type and Rh: 5100
Unit Type and Rh: 5100

## 2018-08-15 LAB — URINE CULTURE: Culture: NO GROWTH

## 2018-08-15 LAB — CBC
HEMATOCRIT: 25.1 % — AB (ref 39.0–52.0)
Hemoglobin: 7.9 g/dL — ABNORMAL LOW (ref 13.0–17.0)
MCH: 30.7 pg (ref 26.0–34.0)
MCHC: 31.5 g/dL (ref 30.0–36.0)
MCV: 97.7 fL (ref 80.0–100.0)
Platelets: 279 10*3/uL (ref 150–400)
RBC: 2.57 MIL/uL — ABNORMAL LOW (ref 4.22–5.81)
RDW: 18.8 % — ABNORMAL HIGH (ref 11.5–15.5)
WBC: 15.2 10*3/uL — AB (ref 4.0–10.5)
nRBC: 0.3 % — ABNORMAL HIGH (ref 0.0–0.2)

## 2018-08-15 LAB — BASIC METABOLIC PANEL
Anion gap: 10 (ref 5–15)
BUN: 14 mg/dL (ref 6–20)
CO2: 30 mmol/L (ref 22–32)
Calcium: 8.2 mg/dL — ABNORMAL LOW (ref 8.9–10.3)
Chloride: 97 mmol/L — ABNORMAL LOW (ref 98–111)
Creatinine, Ser: 1.14 mg/dL (ref 0.61–1.24)
GFR calc non Af Amer: >60 mL/min (ref >60)
Glucose, Bld: 107 mg/dL — ABNORMAL HIGH (ref 70–99)
Potassium: 3.4 mmol/L — ABNORMAL LOW (ref 3.5–5.1)
Sodium: 137 mmol/L (ref 135–145)

## 2018-08-15 MED ORDER — FUROSEMIDE 10 MG/ML IJ SOLN
40.0000 mg | Freq: Once | INTRAMUSCULAR | Status: AC
  Administered 2018-08-15: 40 mg via INTRAVENOUS
  Filled 2018-08-15: qty 4

## 2018-08-15 MED ORDER — POTASSIUM CHLORIDE CRYS ER 20 MEQ PO TBCR
40.0000 meq | EXTENDED_RELEASE_TABLET | Freq: Two times a day (BID) | ORAL | Status: DC
  Administered 2018-08-15: 40 meq via ORAL
  Filled 2018-08-15: qty 2

## 2018-08-15 MED ORDER — POTASSIUM CHLORIDE CRYS ER 20 MEQ PO TBCR
40.0000 meq | EXTENDED_RELEASE_TABLET | Freq: Two times a day (BID) | ORAL | Status: DC
  Administered 2018-08-15 – 2018-08-17 (×4): 40 meq via ORAL
  Filled 2018-08-15 (×4): qty 2

## 2018-08-15 MED ORDER — FUROSEMIDE 10 MG/ML IJ SOLN
40.0000 mg | Freq: Two times a day (BID) | INTRAMUSCULAR | Status: DC
  Administered 2018-08-15 – 2018-08-17 (×4): 40 mg via INTRAVENOUS
  Filled 2018-08-15 (×4): qty 4

## 2018-08-15 NOTE — Progress Notes (Signed)
PROGRESS NOTE    Dylan Gonzalez  OEU:[id number] DOB: [birth year] DOA: 08/13/2018 PCP: Donald Prose, MD      Brief Narrative:  Mr. Grau is a 52 y.o. 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  Admitted for transfusion, further workup.   Of note: Aug 2019: Admitted for prostate biopsy, required stent placement due to bulky tumor burden -->biopsy showed leiomyosarcaoma Jun 23, 2018: Seen in [place] office, US showed DVT, started Xarelto --> to ER that night with gross hematuria, discharged with Abx for UTI Jun 27, 2018: Again hematuria, evaluated by Urology, thought still to be due to UTI, anticoagulation continued, close follow up arranged Jul 29 2018: Hgb <7 g/dL at Oncology clinic, transfused 2 units Aug 01, 2018: Admitted for fever, vomiting, diagnosed with UTI, treated 3 days vanc/cefepime and idscharged on amoxicillin for MSSA UTI      Assessment & Plan:  Acute blood loss anemia Gross hematuria Hematuria improved today.  Hgb appropriate bump post transfusion 2 units.  Xarelto is provoking bleeding in setting of following possible etiologies: cystitis, ureteritis from stent, direct invasion of bladder by sarcoma.    Discussed with Oncology, Urology --> we will stop Xarelto, monitor Hgb.  Stents will remain in place, be exchanged in a few weeks after next round chemo.   -Stop Xarelto -Trend Hgb  Leukocytosis UTI -Continue ceftriaxone  Leg swelling, possible acute on chronic diastolic CHF Alternatively, this may be IVC compression given his tumor.  This is slightly better with Lasix.  Cr stable, K slightly low. -Continue IV Lasix BID -Supplement K -Strict I/Os, daily weights, daily BMP  Metastatic sarcoma CT yesterday showed partial response of lung lesions, necrosis of main sarcoma in abdomen.  Recent lower extremity DVT US dopplers ordered.   -Stop Xarelto -If  final US doppler read shows LE DVT, will consult IR for IVC filter  Hypertension BP normal -Continue metoprolol, HCTZ   Elevated troponin No chest pain.  Low flat.  Doubt ACS, no further ischemic work up recommended        DVT prophylaxis: SCDs Code Status: FULL Family Communication: Wife by phone MDM and disposition Plan: The below labs and imaging reports were reviewed and summarized above. Anticoagulant managed.  Case discussed with urology and oncology.  He was admitted with hematuria and acute blood loss anemia.  As well as leg swelling, which I suspect is from acute on chronic diastolic CHF.  His anemia has been corrected, his Xarelto is being held.  We are waiting final results of his Dopplers, and placement of an IVC filter.  In the meantime we will continue IV diuresis and monitor his leg swelling.  Likely home by Thursday.     Objective: Vitals:   08/14/18 1949 08/14/18 2021 08/15/18 0556 08/15/18 1431  BP: 127/82  123/79 126/83  Pulse: (!) 102  100 (!) 104  Resp: 20  (!) 22 20  Temp: 98.4 F (36.9 C)  98.6 F (37 C) 98.2 F (36.8 C)  TempSrc: Oral  Oral   SpO2: 94%  97% 100%  Weight:   99 kg   Height:  _0  (1.803 m)      Intake/Output Summary (Last 24 hours) at 08/15/2018 1542 Last data filed at 08/15/2018 1500 Gross per 24 hour  Intake 2455 ml  Output 5475 ml  Net -3020 ml   Filed Weights   08/15/18 0556  Weight: 99 kg    Examination:   General: Adult male, sitting up in bed, responds appropriately to questions.  Eye contact, dress and hygiene appropriate. 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  No nasal deformity, discharge, or epistaxis OP moist without erythema, exudates, cobblestoning, or ulcers.  No airway deformities.  Neck supple.   Cardiac: Tachycardic, regular, no murmurs, JVP not visible, 2+ lower extremity edema. Respiratory: Normal respiratory rate and rhythm, lungs clear without rales or wheezes. Abdomen: Abdomen soft, nonfocal  tenderness to palpation without guarding.  No distention or hepatosplenomegaly. Extremities: No deformities/injuries.  5/5 grip strength and upper extremity flexion/extension, symmetrically.  Extremities are warm and well-perfused. Neuro: Sensorium intact.  Speech is fluent.  Naming is grossly intact, and the patient's recall, recent and remote, as well as general fund of knowledge seem within normal limits.  Muscle tone normal, without fasciculations.  Upper extremity strength 5/5 and symmetric.  Lower extremities weak and early.  Attention span and concentration are within normal limits.  Psych: Mood " anxious", full range of affect.  Normal rate and rhythm of speech.  Thought content appropriate, and thought process linear.  No SI/HI, aural or visual hallucinations or delusions. Attention and concentration are normal.       Data Reviewed: I have personally reviewed following labs and imaging studies:  CBC: Recent Labs  Lab 08/13/18 1845 08/15/18 0348  WBC 9.4 15.2*  NEUTROABS 6.2  --   HGB 6.3* 7.9*  HCT 20.5* 25.1*  MCV 103.5* 97.7  PLT 370 270   Basic Metabolic Panel: Recent Labs  Lab 08/13/18 1845 08/15/18 0348  NA 138 137  K 3.8 3.4*  CL 101 97*  CO2 26 30  GLUCOSE 117* 107*  BUN 13 14  CREATININE 1.08 1.14  CALCIUM 8.3* 8.2*   GFR: Estimated Creatinine Clearance: 90.9 mL/min (by C-G formula based on SCr of 1.14 mg/dL). Liver Function Tests: Recent Labs  Lab 08/13/18 1845  AST 26  ALT 31  ALKPHOS 115  BILITOT 0.7  PROT 6.3*  ALBUMIN 2.5*   No results for input(s): LIPASE, AMYLASE in the last 168 hours. No results for input(s): AMMONIA in the last 168 hours. Coagulation Profile: No results for input(s): INR, PROTIME in the last 168 hours. Cardiac Enzymes: Recent Labs  Lab 08/13/18 1845 08/14/18 1444  TROPONINI 0.03* 0.04*   BNP (last 3 results) No results for input(s): PROBNP in the last 8760 hours. HbA1C: No results for input(s): HGBA1C in the  last 72 hours. CBG: No results for input(s): GLUCAP in the last 168 hours. Lipid Profile: No results for input(s): CHOL, HDL, LDLCALC, TRIG, CHOLHDL, LDLDIRECT in the last 72 hours. Thyroid Function Tests: No results for input(s): TSH, T4TOTAL, FREET4, T3FREE, THYROIDAB in the last 72 hours. Anemia Panel: No results for input(s): VITAMINB12, FOLATE, FERRITIN, TIBC, IRON, RETICCTPCT in the last 72 hours. Urine analysis:    Component Value Date/Time   COLORURINE YELLOW 08/13/2018 2343   APPEARANCEUR HAZY (A) 08/13/2018 2343   LABSPEC 1.012 08/13/2018 2343   PHURINE 6.0 08/13/2018 2343   GLUCOSEU NEGATIVE 08/13/2018 2343   HGBUR LARGE (A) 08/13/2018 2343   BILIRUBINUR NEGATIVE 08/13/2018 2343   BILIRUBINUR neg 06/01/2014 1333   KETONESUR NEGATIVE 08/13/2018 2343   PROTEINUR 100 (A) 08/13/2018 2343   UROBILINOGEN 0.2 04/19/2015 0907   NITRITE NEGATIVE 08/13/2018 2343   LEUKOCYTESUR TRACE (A) 08/13/2018 2343   Sepsis Labs: _0 (procalcitonin:4,lacticacidven:4)  ) Recent Results (from the past 240 hour(s))  Culture, Urine  Status: None   Collection Time: 08/14/18  3:34 AM  Result Value Ref Range Status   Specimen Description   Final    URINE, CLEAN CATCH Performed at [facility], [place] [address] [place], [place] [address]    Special Requests   Final    NONE Performed at [facility], [place] [address] [place], [place] [address]    Culture   Final    NO GROWTH Performed at [facility], [place] [address], [place], [place] [address]    Report Status 08/15/2018 FINAL  Final         Radiology Studies: Ct Abdomen Pelvis W Wo Contrast  Result Date: 08/14/2018 CLINICAL DATA:  metastatic sarcoma of the pelvis (arising from the prostate) with leg swelling and pain. Decreased hemoglobin. Recently started on blood thinners for deep venous thrombosis. Stent placement in August. Recurrent hematuria. EXAM: CT ABDOMEN AND PELVIS  WITHOUT AND WITH CONTRAST TECHNIQUE: Multidetector CT imaging of the abdomen and pelvis was performed following the standard protocol before and following the bolus administration of intravenous contrast. CONTRAST:  135m OMNIPAQUE IOHEXOL 300 MG/ML  SOLN COMPARISON:  Renal ultrasound of 07/26/2018. Most recent CT of 05/22/2018. FINDINGS: Lower chest: Index left lower lobe pulmonary nodule measures 6 mm on image 15/9 and is decreased from 8 mm on the prior. Normal heart size without pericardial effusion. Trace left pleural fluid is new. Incompletely imaged central line. Hepatobiliary: Right hepatic lobe cyst and too small to characterize lesion, also likely a cyst. Normal gallbladder, without biliary ductal dilatation. Pancreas: Normal, without mass or ductal dilatation. Spleen: Normal in size, without focal abnormality. Adrenals/Urinary Tract: Normal adrenal glands. No renal calculi. Bilateral ureteric stents remain in place. No ureteric or bladder stones. No hydronephrosis. This stents terminate in the urinary bladder, which is anteriorly displaced and compressed by the below described tumor. The renal collecting systems are well opacified on delayed images, without filling defect. The ureters are not well opacified. No dominant enhancing bladder mass. The bladder is not well opacified on delayed images, decompressed by a Foley catheter. Stomach/Bowel: Normal stomach, without wall thickening. Colonic stool burden suggests constipation. Normal terminal ileum and appendix. Normal small bowel. Vascular/Lymphatic: Normal caliber of the aorta and branch vessels. No abdominal adenopathy. A right external iliac node measures 10 mm on image 82/5 and is similar to minimally decreased from 11 mm on the prior. Reproductive: Central pelvic mass is heterogeneous and necrotic, including at 9.5 x 11.6 cm on image 75/5. Compare 9.4 x 12.6 cm on the prior exam (when remeasured). New trace gas within, including on image 74/13. This  is intimately associated with the posterior wall of the urinary bladder. Other: No significant free fluid. No free intraperitoneal air. Mild periumbilical ventral abdominal wall laxity. Musculoskeletal: Probable bone island in the left femoral head. IMPRESSION: 1. Similar size of a central pelvic mass, with new gas within, likely related to necrosis. This is intimately associated with the posterior wall of the urinary bladder, which is otherwise collapsed and suboptimally evaluated. Cannot exclude direct tumor involvement. 2. Otherwise, no explanation for hematuria. Suboptimal ureteric opacification, with ureteric stents in place. 3. Decrease in size of index left lower lobe pulmonary nodule, suggesting response to therapy of pulmonary metastasis. 4. Similar borderline right external iliac adenopathy, indeterminate. 5. Trace left pleural fluid. Electronically Signed   By: KAbigail MiyamotoM.D.   On: 08/14/2018 20:40   Dg Chest 2 View  Result Date: 08/13/2018 CLINICAL DATA:  Swelling, bilateral leg pain, history of prostate cancer EXAM: CHEST - 2 VIEW COMPARISON:  08/04/2018 FINDINGS: Lungs are clear.  No pleural effusion or pneumothorax. The heart is normal in size. Right chest power port terminates in the lower SVC. Visualized osseous structures are within normal limits. IMPRESSION: Normal chest radiographs. Electronically Signed   By: Julian Hy M.D.   On: 08/13/2018 19:35   Vas [place] Lower Extremity Venous (dvt)  Result Date: 08/15/2018  Lower Venous Study Indications: Edema. Other Indications: Malignant neoplasm metastasis. Risk Factors: Chemotherapy Cancer history of leiomyosarcoma of prostate. Limitations: Body habitus, line and edema, patient is very sensitive for compression, immobility. Comparison Study: 08/02/2018 and 06/23/2018 Performing Technologist: Rudell Cobb  Examination Guidelines: A complete evaluation includes B-mode imaging, spectral Doppler, color Doppler, and power Doppler as needed  of all accessible portions of each vessel. Bilateral testing is considered an integral part of a complete examination. Limited examinations for reoccurring indications may be performed as noted.  Right Venous Findings: +---------+---------------+---------+-----------+----------+-------------+          CompressibilityPhasicitySpontaneityPropertiesSummary       +---------+---------------+---------+-----------+----------+-------------+ CFV      Full           Yes      Yes                                +---------+---------------+---------+-----------+----------+-------------+ SFJ      Full                                                       +---------+---------------+---------+-----------+----------+-------------+ FV Prox  Full                                                       +---------+---------------+---------+-----------+----------+-------------+ FV Mid   Full                                                       +---------+---------------+---------+-----------+----------+-------------+ FV DistalFull                                                       +---------+---------------+---------+-----------+----------+-------------+ PFV      Full                                                       +---------+---------------+---------+-----------+----------+-------------+ POP      Full           Yes      Yes                  sluggish flow +---------+---------------+---------+-----------+----------+-------------+ PTV      Full                                                       +---------+---------------+---------+-----------+----------+-------------+  PERO     Full                                                       +---------+---------------+---------+-----------+----------+-------------+ Gastroc  Full                                                       +---------+---------------+---------+-----------+----------+-------------+  Left  Venous Findings: +---------+---------------+---------+-----------+----------+------------------+          CompressibilityPhasicitySpontaneityPropertiesSummary            +---------+---------------+---------+-----------+----------+------------------+ CFV      Full           Yes      Yes                                     +---------+---------------+---------+-----------+----------+------------------+ SFJ      Full                                                            +---------+---------------+---------+-----------+----------+------------------+ FV Prox  Full                                                            +---------+---------------+---------+-----------+----------+------------------+ FV Mid   Full                                                            +---------+---------------+---------+-----------+----------+------------------+ FV DistalFull                                                            +---------+---------------+---------+-----------+----------+------------------+ PFV      Full                                                            +---------+---------------+---------+-----------+----------+------------------+ POP      Partial        Yes      Yes                  see comment below. +---------+---------------+---------+-----------+----------+------------------+ PTV      Full                                                            +---------+---------------+---------+-----------+----------+------------------+  PERO     Full                                                            +---------+---------------+---------+-----------+----------+------------------+ Gastroc  Full                                                            +---------+---------------+---------+-----------+----------+------------------+ Popliteal distal segment is difficult to compress, age indeterminate thrombosis    Summary:  Right: There is no evidence of deep vein thrombosis in the lower extremity. However, portions of this examination were limited- see technologist comments above. No cystic structure found in the popliteal fossa. Left: Findings consistent with age indeterminate deep vein thrombosis involving the left popliteal vein. No cystic structure found in the popliteal fossa.  *See table(s) above for measurements and observations.    Preliminary         Scheduled Meds: . hydrochlorothiazide  25 mg Oral Daily  . metoprolol succinate  50 mg Oral Daily  . potassium chloride  40 mEq Oral BID   Continuous Infusions: . ceFEPime (MAXIPIME) IV Stopped (08/15/18 1245)     LOS: 1 day    Time spent: 35 minutes    Edwin Dada, MD [facility] 08/15/2018, 3:42 PM     Pager [phone] --- please page though AMION:  [email or web address] Password [email or web address] If 7PM-7AM, please contact night-coverage

## 2018-08-15 NOTE — Consult Note (Signed)
Subjective: CC: Hematuria.   Hx:  I was asked to see Dylan Gonzalez in consultation by Dr. Loleta Books for recurrent hematuria.  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  He is on Xarelto for a DVT and has had several episodes of gross hematuria with UTI, most recently staph.  He is in them middle of chemotherapy with 2 cycles left to go.  He had a CT yesterday that showed significant necrosis of the pelvic mass but no decrease in size.  The pulmonary mass is smaller as well.   He has some issues with bladder spasms but his urine is clearing today.  ROS:  Review of Systems  Cardiovascular: Positive for leg swelling.  Genitourinary: Positive for hematuria and urgency.    Allergies  Allergen Reactions  . Pollen Extract Other (See Comments)    Sneezing and shortness of breath    Past Medical History:  Diagnosis Date  . CHF (congestive heart failure) ([place])   . Hypertension   . 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 MI (myocardial infarction) ([place])    2007  . 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 CYSTOSCOPY W/ URETERAL STENT PLACEMENT Bilateral 04/13/2018   Procedure: CYSTOSCOPY WITH BILATERAL STENT REPLACEMENT;  Surgeon: Irine Seal, MD;  Location: [facility];  Service: Urology;  Laterality: Bilateral;  . eye removed    . HERNIA REPAIR    . INTRAOCULAR PROSTHESES INSERTION    . IR IMAGING GUIDED PORT INSERTION  06/21/2018  . PROSTATE BIOPSY [name] 04/13/2018   Procedure: ULTRASOUND GUIDED PROSTATE BIOPSY;  Surgeon: Irine Seal, MD;  Location: [facility];  Service: Urology;  Laterality: [name];  . TOOTH EXTRACTION Right 04/21/2013   Procedure: EXTRACTION MOLARS;  Surgeon: [name], DDS;  Location: [facility];  Service: Oral Surgery;  Laterality: Right;  . TRANSURETHRAL RESECTION OF PROSTATE  04/13/2018   Procedure: TRANSURETHRAL RESECTION OF THE PROSTATE (TURP);  Surgeon: Irine Seal, MD;  Location: [facility];  Service: Urology;;    Social History   Socioeconomic History  . Marital status: Married    Spouse name: Not on file  . Number of children: Not on file  . Years of education: Not on file  . Highest education level: Not on file  Occupational History  . Not on file  Social Needs  . Financial resource strain: Not on file  . Food insecurity:    Worry: Not on file    Inability: Not on file  . Transportation needs:    Medical: Yes    Non-medical: Yes  Tobacco Use  . Smoking status: Never Smoker  . Smokeless tobacco: Never Used  Substance and Sexual Activity  . Alcohol use: No  . Drug use: No  . Sexual activity: Not Currently  Lifestyle  . Physical activity:    Days per week: Not on file    Minutes per session: Not on file  . Stress: Not on file  Relationships  . Social connections:    Talks on phone: Not on file    Gets together: Not on file    Attends religious service: Not on file    Active member of club or organization: Not on file  Attends meetings of clubs or organizations: Not on file    Relationship status: Not on file  . Intimate partner violence:    Fear of current or ex partner: Not on file    Emotionally abused: Not on file    Physically abused: Not on file    Forced sexual activity: Not on file  Other Topics Concern  . Not on file  Social History Narrative  . Not on file    Family History  Problem Relation Age of Onset  . Hypertension Father   . Depression Brother     Anti-infectives: Anti-infectives (From admission, onward)   Start     Dose/Rate Route Frequency Ordered Stop   08/15/18 0000  ceFEPIme (MAXIPIME) 2 g in sodium chloride 0.9 % 100 mL IVPB     2 g 200 mL/hr over 30 Minutes Intravenous Every 12 hours  08/14/18 1527     08/14/18 1530  ceFEPIme (MAXIPIME) 2 g in sodium chloride 0.9 % 100 mL IVPB     2 g 200 mL/hr over 30 Minutes Intravenous  Once 08/14/18 1521 08/14/18 1646   08/14/18 0300  cefTRIAXone (ROCEPHIN) 1 g in sodium chloride 0.9 % 100 mL IVPB  Status:  Discontinued     1 g 200 mL/hr over 30 Minutes Intravenous Every 24 hours 08/14/18 0231 08/14/18 1507      Current Facility-Administered Medications  Medication Dose Route Frequency Provider Last Rate Last Dose  . acetaminophen (TYLENOL) tablet 650 mg  650 mg Oral Q6H PRN Rise Patience, MD       Or  . acetaminophen (TYLENOL) suppository 650 mg  650 mg Rectal Q6H PRN Rise Patience, MD      . albuterol (PROVENTIL) (2.5 MG/3ML) 0.083% nebulizer solution 2.5 mg  2.5 mg Inhalation Q6H PRN Rise Patience, MD      . ALPRAZolam Duanne Moron) tablet 0.5 mg  0.5 mg Oral BID PRN Rise Patience, MD   0.5 mg at 08/15/18 6389  . bisacodyl (DULCOLAX) suppository 10 mg  10 mg Rectal Daily PRN Rise Patience, MD      . ceFEPIme (MAXIPIME) 2 g in sodium chloride 0.9 % 100 mL IVPB  2 g Intravenous Q12H Shade, Christine E, RPH 200 mL/hr at 08/15/18 0037 2 g at 08/15/18 0037  . docusate sodium (COLACE) capsule 100 mg  100 mg Oral Daily PRN Rise Patience, MD      . fluticasone (FLONASE) 50 MCG/ACT nasal spray 2 spray  2 spray Each Nare Daily PRN Rise Patience, MD      . hydrochlorothiazide (HYDRODIURIL) tablet 25 mg  25 mg Oral Daily Rise Patience, MD   25 mg at 08/15/18 3734  . hydrOXYzine (ATARAX/VISTARIL) tablet 100 mg  100 mg Oral QHS PRN Rise Patience, MD      . metoprolol succinate (TOPROL-XL) 24 hr tablet 50 mg  50 mg Oral Daily Rise Patience, MD   50 mg at 08/15/18 0906  . ondansetron (ZOFRAN) tablet 4 mg  4 mg Oral Q6H PRN Rise Patience, MD       Or  . ondansetron [facility]) injection 4 mg  4 mg Intravenous Q6H PRN Rise Patience, MD      . oxyCODONE (Oxy IR/ROXICODONE)  immediate release tablet 5-10 mg  5-10 mg Oral Q4H PRN Rise Patience, MD   10 mg at 08/15/18 0711  . polyethylene glycol (MIRALAX / GLYCOLAX) packet 17 g  17 g Oral  BID PRN Rise Patience, MD      . potassium chloride SA (K-DUR,KLOR-CON) CR tablet 40 mEq  40 mEq Oral BID Edwin Dada, MD   40 mEq at 08/15/18 0906  . sodium chloride flush (NS) 0.9 % injection 10-40 mL  10-40 mL Intracatheter PRN Edwin Dada, MD   10 mL at 08/14/18 1437     Objective: Vital signs in last 24 hours: Temp:  [98.4 F (36.9 C)-99.5 F (37.5 C)] 98.6 F (37 C) (12/10 0556) Pulse Rate:  [93-102] 100 (12/10 0556) Resp:  [20-22] 22 (12/10 0556) BP: (103-131)/(64-88) 123/79 (12/10 0556) SpO2:  [94 %-99 %] 97 % (12/10 0556) Weight:  [99 kg] 99 kg (12/10 0556)  Intake/Output from previous day: 12/09 0701 - 12/10 0700 In: 2915 [P.O.:1680; I.V.:20; Blood:300; IV Piggyback:300] Out: 2440 [Urine:4725] Intake/Output this shift: No intake/output data recorded.   Physical Exam  Constitutional: He appears well-developed and well-nourished.  Genitourinary:  Genitourinary Comments: Urine is very light pink in the tubing.   Vitals reviewed.   Lab Results:  Recent Labs    08/13/18 1845 08/15/18 0348  WBC 9.4 15.2*  HGB 6.3* 7.9*  HCT 20.5* 25.1*  PLT 370 279   BMET Recent Labs    08/13/18 1845 08/15/18 0348  NA 138 137  K 3.8 3.4*  CL 101 97*  CO2 26 30  GLUCOSE 117* 107*  BUN 13 14  CREATININE 1.08 1.14  CALCIUM 8.3* 8.2*   PT/INR No results for input(s): LABPROT, INR in the last 72 hours. ABG No results for input(s): PHART, HCO3 in the last 72 hours.  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 Decreased hemoglobin. Recently started on blood thinners for deep venous thrombosis. Stent placement  in August. Recurrent hematuria. EXAM: CT ABDOMEN AND PELVIS WITHOUT AND WITH CONTRAST TECHNIQUE: Multidetector CT imaging of the abdomen and pelvis was performed following the standard protocol before and following the bolus administration of intravenous contrast. CONTRAST:  163mL OMNIPAQUE IOHEXOL 300 MG/ML  SOLN COMPARISON:  Renal ultrasound of 07/26/2018. Most recent CT of 05/22/2018. FINDINGS: Lower chest: Index left lower lobe pulmonary nodule measures 6 mm on image 15/9 and is decreased from 8 mm on the prior. Normal heart size without pericardial effusion. Trace left pleural fluid is new. Incompletely imaged central line. Hepatobiliary: Right hepatic lobe cyst and too small to characterize lesion, also likely a cyst. Normal gallbladder, without biliary ductal dilatation. Pancreas: Normal, without mass or ductal dilatation. Spleen: Normal in size, without focal abnormality. Adrenals/Urinary Tract: Normal adrenal glands. No renal calculi. Bilateral ureteric stents remain in place. No ureteric or bladder stones. No hydronephrosis. This stents terminate in the urinary bladder, which is anteriorly displaced and compressed by the below described tumor. The renal collecting systems are well opacified on delayed images, without filling defect. The ureters are not well opacified. No dominant enhancing bladder mass. The bladder is not well opacified on delayed images, decompressed by a Foley catheter. Stomach/Bowel: Normal stomach, without wall thickening. Colonic stool burden suggests constipation. Normal terminal ileum and appendix. Normal small bowel. Vascular/Lymphatic: Normal caliber of the aorta and branch vessels. No abdominal adenopathy. A right external iliac node measures 10 mm on image 82/5 and is similar to minimally decreased from 11 mm on the prior. Reproductive: Central pelvic mass is heterogeneous and necrotic, including at  9.5 x 11.6 cm on image 75/5. Compare 9.4 x 12.6 cm on the prior exam (when  remeasured). New trace gas within, including on image 74/13. This is intimately associated with the posterior wall of the urinary bladder. Other: No significant free fluid. No free intraperitoneal air. Mild periumbilical ventral abdominal wall laxity. Musculoskeletal: Probable bone island in the left femoral head. IMPRESSION: 1. Similar size of a central pelvic mass, with new gas within, likely related to necrosis. This is intimately associated with the posterior wall of the urinary bladder, which is otherwise collapsed and suboptimally evaluated. Cannot exclude direct tumor involvement. 2. Otherwise, no explanation for hematuria. Suboptimal ureteric opacification, with ureteric stents in place. 3. Decrease in size of index left lower lobe pulmonary nodule, suggesting response to therapy of pulmonary metastasis. 4. Similar borderline right external iliac adenopathy, indeterminate. 5. Trace left pleural fluid. Electronically Signed   By: Abigail Miyamoto M.D.   On: 08/14/2018 20:40   Dg Chest 2 View  Result Date: 08/13/2018 CLINICAL DATA:  Swelling, bilateral leg pain, history of prostate cancer EXAM: CHEST - 2 VIEW COMPARISON:  08/04/2018 FINDINGS: Lungs are clear.  No pleural effusion or pneumothorax. The heart is normal in size. Right chest power port terminates in the lower SVC. Visualized osseous structures are within normal limits. IMPRESSION: Normal chest radiographs. Electronically Signed   By: Julian Hy M.D.   On: 08/13/2018 19:35   I have reviewed his films and labs.     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  He has recurrent gross hematuria with anemia on Xarelto.  I discussed conversion to percutaneous nephrostomy tubes, but at this point I think we need to exchange the stents after he has completed his next two rounds of chemotherapy.   Gross hematuria has resolved with treatment of the UTI and cessation of Xarelto.     CC: Dr. Myrene Buddy.      Irine Seal 08/15/2018 [phone]

## 2018-08-15 NOTE — Progress Notes (Signed)
Bilateral lower extremities venous duplex exam completed.   Findings consistent with age indeterminate deep vein thrombosis involving the left popliteal vein distally.  Please see preliminary notes on CV PROC under chart review. Joshu Furukawa H Greysen Swanton(RDMS RVT) 08/15/18 3:40 PM

## 2018-08-16 ENCOUNTER — Inpatient Hospital Stay: Payer: BLUE CROSS/BLUE SHIELD

## 2018-08-16 ENCOUNTER — Inpatient Hospital Stay (HOSPITAL_COMMUNITY): Payer: BLUE CROSS/BLUE SHIELD

## 2018-08-16 DIAGNOSIS — Z95828 Presence of other vascular implants and grafts: Secondary | ICD-10-CM

## 2018-08-16 HISTORY — DX: Presence of other vascular implants and grafts: Z95.828

## 2018-08-16 HISTORY — PX: IR IVC FILTER PLMT / S&I /IMG GUID/MOD SED: IMG701

## 2018-08-16 LAB — CBC
HCT: 25 % — ABNORMAL LOW (ref 39.0–52.0)
Hemoglobin: 7.7 g/dL — ABNORMAL LOW (ref 13.0–17.0)
MCH: 30.2 pg (ref 26.0–34.0)
MCHC: 30.8 g/dL (ref 30.0–36.0)
MCV: 98 fL (ref 80.0–100.0)
Platelets: 340 10*3/uL (ref 150–400)
RBC: 2.55 MIL/uL — ABNORMAL LOW (ref 4.22–5.81)
RDW: 18 % — ABNORMAL HIGH (ref 11.5–15.5)
WBC: 12.9 10*3/uL — ABNORMAL HIGH (ref 4.0–10.5)
nRBC: 0.2 % (ref 0.0–0.2)

## 2018-08-16 LAB — BASIC METABOLIC PANEL
Anion gap: 10 (ref 5–15)
BUN: 14 mg/dL (ref 6–20)
CO2: 30 mmol/L (ref 22–32)
Calcium: 8.6 mg/dL — ABNORMAL LOW (ref 8.9–10.3)
Chloride: 99 mmol/L (ref 98–111)
Creatinine, Ser: 0.96 mg/dL (ref 0.61–1.24)
GFR calc Af Amer: >60 mL/min (ref >60)
GFR calc non Af Amer: >60 mL/min (ref >60)
Glucose, Bld: 114 mg/dL — ABNORMAL HIGH (ref 70–99)
Potassium: 3.6 mmol/L (ref 3.5–5.1)
SODIUM: 139 mmol/L (ref 135–145)

## 2018-08-16 LAB — PROTIME-INR
INR: 1.26
Prothrombin Time: 15.7 seconds — ABNORMAL HIGH (ref 11.4–15.2)

## 2018-08-16 MED ORDER — IOHEXOL 300 MG/ML  SOLN
100.0000 mL | Freq: Once | INTRAMUSCULAR | Status: AC | PRN
  Administered 2018-08-16: 24 mL via INTRAVENOUS

## 2018-08-16 MED ORDER — LIDOCAINE HCL 1 % IJ SOLN
INTRAMUSCULAR | Status: AC | PRN
  Administered 2018-08-16: 10 mL

## 2018-08-16 MED ORDER — FENTANYL CITRATE (PF) 100 MCG/2ML IJ SOLN
INTRAMUSCULAR | Status: AC
  Filled 2018-08-16: qty 2

## 2018-08-16 MED ORDER — IOPAMIDOL (ISOVUE-300) INJECTION 61%
INTRAVENOUS | Status: AC
  Administered 2018-08-16: 10 mL via INTRAVENOUS
  Filled 2018-08-16: qty 50

## 2018-08-16 MED ORDER — FENTANYL CITRATE (PF) 100 MCG/2ML IJ SOLN
INTRAMUSCULAR | Status: AC | PRN
  Administered 2018-08-16: 50 ug via INTRAVENOUS

## 2018-08-16 MED ORDER — LIDOCAINE HCL 1 % IJ SOLN
INTRAMUSCULAR | Status: AC
  Filled 2018-08-16: qty 20

## 2018-08-16 MED ORDER — MIDAZOLAM HCL 2 MG/2ML IJ SOLN
INTRAMUSCULAR | Status: AC
  Filled 2018-08-16: qty 2

## 2018-08-16 MED ORDER — MIDAZOLAM HCL 2 MG/2ML IJ SOLN
INTRAMUSCULAR | Status: AC | PRN
  Administered 2018-08-16: 1 mg via INTRAVENOUS

## 2018-08-16 MED ORDER — IOPAMIDOL (ISOVUE-300) INJECTION 61%
50.0000 mL | Freq: Once | INTRAVENOUS | Status: AC | PRN
  Administered 2018-08-16: 10 mL via INTRAVENOUS

## 2018-08-16 NOTE — Progress Notes (Signed)
PROGRESS NOTE    Dylan Gonzalez  TKW:[id number] DOB: Jun 10, 1966 DOA: 08/13/2018 PCP: Donald Prose, MD      Brief Narrative:  Dylan Gonzalez is a 52 y.o. 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  Admitted for transfusion, further workup.   Of note: Aug 2019: Admitted for prostate biopsy, required stent placement due to bulky tumor burden -->biopsy showed leiomyosarcaoma Jun 23, 2018: Seen in [place] office, US showed DVT, started Xarelto --> to ER that night with gross hematuria, discharged with Abx for UTI Jun 27, 2018: Again hematuria, evaluated by Urology, thought still to be due to UTI, anticoagulation continued, close follow up arranged Jul 29 2018: Hgb <7 g/dL at Oncology clinic, transfused 2 units Aug 01, 2018: Admitted for fever, vomiting, diagnosed with UTI, treated 3 days vanc/cefepime and idscharged on amoxicillin for MSSA UTI    Assessment & Plan:  Acute blood loss anemia Gross hematuria Hematuria improved today.  Hgb appropriate bump post transfusion 2 units.  Xarelto is provoking bleeding in setting of following possible etiologies: cystitis, ureteritis from stent, direct invasion of bladder by sarcoma.    Discussed with Oncology, Urology --> we will stop Xarelto, monitor Hgb.  Stents will remain in place, be exchanged in a few weeks after next round chemo.   -Stop Xarelto -Trend Hgb 08/16/18: -Continue to monitor hemoglobin and transfuse as needed for hemoglobin less than 7.  Leukocytosis UTI -Continue ceftriaxone 08/16/18: - Cx NGTD  Leg swelling, possible acute on chronic diastolic CHF Alternatively, this may be IVC compression given his tumor. -Most recent Doppler study showed age indeterminate DVT involving left popliteal vein.  This is slightly better with Lasix.  Cr stable, K slightly low. -Continue IV Lasix BID -Supplement K -Strict I/Os, daily weights,  daily BMP  Metastatic sarcoma CT showed partial response of lung lesions, necrosis of main sarcoma in abdomen.  Recent lower extremity DVT -Stop Xarelto - consult IR for IVC filter 08/16/18: - Plan for IVC filter placement today per IR.  Hypertension BP normal -Continue metoprolol, HCTZ  08/16/18: -Continue to monitor blood pressure closely and adjust medications as needed.  Elevated troponin No chest pain.  Low flat.  Doubt ACS, no further ischemic work up recommended   DVT prophylaxis: SCDs Code Status: FULL Family Communication: No family at bedside MDM and disposition Plan: The below labs and imaging reports were reviewed and summarized above. Anticoagulant managed.  Case discussed with urology and oncology.  He was admitted with hematuria and acute blood loss anemia.  As well as leg swelling, which suspect is from acute on chronic diastolic CHF.  However, latest bilateral lower extremity venous Doppler study revealed age-indeterminate DVT involving left popliteal vein.  His anemia has been corrected, his Xarelto is being held.  We are waiting placement of an IVC filter.  In the meantime we will continue IV diuresis and monitor his leg swelling.  Likely home 1-2 days.     Objective: Vitals:   08/15/18 2025 08/16/18 0500 08/16/18 0632 08/16/18 1412  BP: 117/74  125/81 122/82  Pulse: (!) 109  100 100  Resp: 18  18 18   Temp: 98.3 F (36.8 C)  98.4 F (36.9 C) 97.9 F (36.6 C)  TempSrc: Oral  Oral Oral  SpO2: 97%  100% 98%  Weight:  94.1 kg    Height:        Intake/Output Summary (  Last 24 hours) at 08/16/2018 1448 Last data filed at 08/16/2018 1142 Gross per 24 hour  Intake 376 ml  Output 3650 ml  Net -3274 ml   Filed Weights   08/15/18 0556 08/16/18 0500  Weight: 99 kg 94.1 kg    Examination:   General: Adult male, sitting up in bed, responds appropriately to questions.  Eye contact, dress and hygiene appropriate. 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  No  nasal deformity, discharge, or epistaxis OP moist without erythema, exudates, cobblestoning, or ulcers.  No airway deformities.  Neck supple.   Cardiac: Tachycardic, regular, no murmurs, JVP not visible, 2+ lower extremity edema. Left>right Respiratory: Normal respiratory rate and rhythm, lungs clear without rales or wheezes. Abdomen: Abdomen soft, nonfocal tenderness to palpation without guarding.  No distention or hepatosplenomegaly. Extremities: No deformities/injuries.  5/5 grip strength and upper extremity flexion/extension, symmetrically.  Extremities are warm and well-perfused. Neuro: Sensorium intact.  Speech is fluent.  Naming is grossly intact, and the patient's recall, recent and remote, as well as general fund of knowledge seem within normal limits.  Muscle tone normal, without fasciculations.  Upper extremity strength 5/5 and symmetric.  Lower extremities weak and early.  Attention span and concentration are within normal limits.  Psych: Mood " anxious", full range of affect.  Normal rate and rhythm of speech.  Thought content appropriate, and thought process linear.  No SI/HI, aural or visual hallucinations or delusions. Attention and concentration are normal.    Data Reviewed: I have personally reviewed following labs and imaging studies:  CBC: Recent Labs  Lab 08/13/18 1845 08/15/18 0348 08/16/18 0432  WBC 9.4 15.2* 12.9*  NEUTROABS 6.2  --   --   HGB 6.3* 7.9* 7.7*  HCT 20.5* 25.1* 25.0*  MCV 103.5* 97.7 98.0  PLT 370 279 628   Basic Metabolic Panel: Recent Labs  Lab 08/13/18 1845 08/15/18 0348 08/16/18 0432  NA 138 137 139  K 3.8 3.4* 3.6  CL 101 97* 99  CO2 26 30 30   GLUCOSE 117* 107* 114*  BUN 13 14 14   CREATININE 1.08 1.14 0.96  CALCIUM 8.3* 8.2* 8.6*   GFR: Estimated Creatinine Clearance: 105.4 mL/min (by C-G formula based on SCr of 0.96 mg/dL). Liver Function Tests: Recent Labs  Lab 08/13/18 1845  AST 26  ALT 31  ALKPHOS 115  BILITOT 0.7  PROT  6.3*  ALBUMIN 2.5*   No results for input(s): LIPASE, AMYLASE in the last 168 hours. No results for input(s): AMMONIA in the last 168 hours. Coagulation Profile: Recent Labs  Lab 08/16/18 1038  INR 1.26   Cardiac Enzymes: Recent Labs  Lab 08/13/18 1845 08/14/18 1444  TROPONINI 0.03* 0.04*   BNP (last 3 results) No results for input(s): PROBNP in the last 8760 hours. HbA1C: No results for input(s): HGBA1C in the last 72 hours. CBG: No results for input(s): GLUCAP in the last 168 hours. Lipid Profile: No results for input(s): CHOL, HDL, LDLCALC, TRIG, CHOLHDL, LDLDIRECT in the last 72 hours. Thyroid Function Tests: No results for input(s): TSH, T4TOTAL, FREET4, T3FREE, THYROIDAB in the last 72 hours. Anemia Panel: No results for input(s): VITAMINB12, FOLATE, FERRITIN, TIBC, IRON, RETICCTPCT in the last 72 hours. Urine analysis:    Component Value Date/Time   COLORURINE YELLOW 08/13/2018 2343   APPEARANCEUR HAZY (A) 08/13/2018 2343   LABSPEC 1.012 08/13/2018 2343   PHURINE 6.0 08/13/2018 2343   GLUCOSEU NEGATIVE 08/13/2018 2343   HGBUR LARGE (A) 08/13/2018 2343   BILIRUBINUR  NEGATIVE 08/13/2018 2343   BILIRUBINUR neg 06/01/2014 [place] 08/13/2018 2343   PROTEINUR 100 (A) 08/13/2018 2343   UROBILINOGEN 0.2 04/19/2015 0907   NITRITE NEGATIVE 08/13/2018 2343   LEUKOCYTESUR TRACE (A) 08/13/2018 2343   Sepsis Labs: @LABRCNTIP (procalcitonin:4,lacticacidven:4)  ) Recent Results (from the past 240 hour(s))  Culture, Urine     Status: None   Collection Time: 08/14/18  3:34 AM  Result Value Ref Range Status   Specimen Description   Final    URINE, CLEAN CATCH Performed at [facility], [place] [address] [place], [place] [address]    Special Requests   Final    NONE Performed at [facility], [place] [address] [place], [place] [address]    Culture   Final    NO GROWTH Performed at [facility], [place]  [address] [place],  [address]    Report Status 08/15/2018 FINAL  Final         Radiology Studies: Ct Abdomen Pelvis W Wo Contrast  Result Date: 08/14/2018 CLINICAL DATA:  metastatic sarcoma of the pelvis (arising from the prostate) with leg swelling and pain. Decreased hemoglobin. Recently started on blood thinners for deep venous thrombosis. Stent placement in August. Recurrent hematuria. EXAM: CT ABDOMEN AND PELVIS WITHOUT AND WITH CONTRAST TECHNIQUE: Multidetector CT imaging of the abdomen and pelvis was performed following the standard protocol before and following the bolus administration of intravenous contrast. CONTRAST:  122m OMNIPAQUE IOHEXOL 300 MG/ML  SOLN COMPARISON:  Renal ultrasound of 07/26/2018. Most recent CT of 05/22/2018. FINDINGS: Lower chest: Index left lower lobe pulmonary nodule measures 6 mm on image 15/9 and is decreased from 8 mm on the prior. Normal heart size without pericardial effusion. Trace left pleural fluid is new. Incompletely imaged central line. Hepatobiliary: Right hepatic lobe cyst and too small to characterize lesion, also likely a cyst. Normal gallbladder, without biliary ductal dilatation. Pancreas: Normal, without mass or ductal dilatation. Spleen: Normal in size, without focal abnormality. Adrenals/Urinary Tract: Normal adrenal glands. No renal calculi. Bilateral ureteric stents remain in place. No ureteric or bladder stones. No hydronephrosis. This stents terminate in the urinary bladder, which is anteriorly displaced and compressed by the below described tumor. The renal collecting systems are well opacified on delayed images, without filling defect. The ureters are not well opacified. No dominant enhancing bladder mass. The bladder is not well opacified on delayed images, decompressed by a Foley catheter. Stomach/Bowel: Normal stomach, without wall thickening. Colonic stool burden suggests constipation. Normal terminal ileum and appendix. Normal small  bowel. Vascular/Lymphatic: Normal caliber of the aorta and branch vessels. No abdominal adenopathy. A right external iliac node measures 10 mm on image 82/5 and is similar to minimally decreased from 11 mm on the prior. Reproductive: Central pelvic mass is heterogeneous and necrotic, including at 9.5 x 11.6 cm on image 75/5. Compare 9.4 x 12.6 cm on the prior exam (when remeasured). New trace gas within, including on image 74/13. This is intimately associated with the posterior wall of the urinary bladder. Other: No significant free fluid. No free intraperitoneal air. Mild periumbilical ventral abdominal wall laxity. Musculoskeletal: Probable bone island in the left femoral head. IMPRESSION: 1. Similar size of a central pelvic mass, with new gas within, likely related to necrosis. This is intimately associated with the posterior wall of the urinary bladder, which is otherwise collapsed and suboptimally evaluated. Cannot exclude direct tumor involvement. 2. Otherwise, no explanation for hematuria. Suboptimal ureteric opacification,  with ureteric stents in place. 3. Decrease in size of index left lower lobe pulmonary nodule, suggesting response to therapy of pulmonary metastasis. 4. Similar borderline right external iliac adenopathy, indeterminate. 5. Trace left pleural fluid. Electronically Signed   By: Abigail Miyamoto M.D.   On: 08/14/2018 20:40   Vas [place] Lower Extremity Venous (dvt)  Result Date: 08/15/2018  Lower Venous Study Indications: Edema. Other Indications: Malignant neoplasm metastasis. Risk Factors: Chemotherapy Cancer history of leiomyosarcoma of prostate. Limitations: Body habitus, line and edema, patient is very sensitive for compression, immobility. Comparison Study: 08/02/2018 and 06/23/2018 Performing Technologist: Rudell Cobb  Examination Guidelines: A complete evaluation includes B-mode imaging, spectral Doppler, color Doppler, and power Doppler as needed of all accessible portions of each  vessel. Bilateral testing is considered an integral part of a complete examination. Limited examinations for reoccurring indications may be performed as noted.  Right Venous Findings: +---------+---------------+---------+-----------+----------+-------------+          CompressibilityPhasicitySpontaneityPropertiesSummary       +---------+---------------+---------+-----------+----------+-------------+ CFV      Full           Yes      Yes                                +---------+---------------+---------+-----------+----------+-------------+ SFJ      Full                                                       +---------+---------------+---------+-----------+----------+-------------+ FV Prox  Full                                                       +---------+---------------+---------+-----------+----------+-------------+ FV Mid   Full                                                       +---------+---------------+---------+-----------+----------+-------------+ FV DistalFull                                                       +---------+---------------+---------+-----------+----------+-------------+ PFV      Full                                                       +---------+---------------+---------+-----------+----------+-------------+ POP      Full           Yes      Yes                  sluggish flow +---------+---------------+---------+-----------+----------+-------------+ PTV      Full                                                       +---------+---------------+---------+-----------+----------+-------------+  PERO     Full                                                       +---------+---------------+---------+-----------+----------+-------------+ Gastroc  Full                                                       +---------+---------------+---------+-----------+----------+-------------+  Left Venous Findings:  +---------+---------------+---------+-----------+----------+------------------+          CompressibilityPhasicitySpontaneityPropertiesSummary            +---------+---------------+---------+-----------+----------+------------------+ CFV      Full           Yes      Yes                                     +---------+---------------+---------+-----------+----------+------------------+ SFJ      Full                                                            +---------+---------------+---------+-----------+----------+------------------+ FV Prox  Full                                                            +---------+---------------+---------+-----------+----------+------------------+ FV Mid   Full                                                            +---------+---------------+---------+-----------+----------+------------------+ FV DistalFull                                                            +---------+---------------+---------+-----------+----------+------------------+ PFV      Full                                                            +---------+---------------+---------+-----------+----------+------------------+ POP      Partial        Yes      Yes                  see comment below. +---------+---------------+---------+-----------+----------+------------------+ PTV      Full                                                            +---------+---------------+---------+-----------+----------+------------------+  PERO     Full                                                            +---------+---------------+---------+-----------+----------+------------------+ Gastroc  Full                                                            +---------+---------------+---------+-----------+----------+------------------+ Popliteal distal segment is difficult to compress, age indeterminate thrombosis    Summary: Right: There is  no evidence of deep vein thrombosis in the lower extremity. However, portions of this examination were limited- see technologist comments above. No cystic structure found in the popliteal fossa. Left: Findings consistent with age indeterminate deep vein thrombosis involving the left popliteal vein. No cystic structure found in the popliteal fossa.  *See table(s) above for measurements and observations. Electronically signed by Deitra Mayo MD on 08/15/2018 at 5:11:18 PM.    Final         Scheduled Meds: . furosemide  40 mg Intravenous BID  . hydrochlorothiazide  25 mg Oral Daily  . metoprolol succinate  50 mg Oral Daily  . potassium chloride  40 mEq Oral BID   Continuous Infusions: . ceFEPime (MAXIPIME) IV 2 g (08/16/18 1056)     LOS: 2 days    Time spent: 35 minutes    Yaakov Guthrie, MD [facility] 08/16/2018, 2:48 PM     Pager [phone] --- please page though AMION:  [email or web address] If 7PM-7AM, please contact night-coverage

## 2018-08-16 NOTE — Procedures (Signed)
Interventional Radiology Procedure Note  Procedure: IVC filter placement  Complications: None  Estimated Blood Loss: < 10 mL  Findings: IVC normally patent. [place] retrievable IVC filter placed in infrarenal IVC.  Venetia Night. Kathlene Cote, M.D Pager:  [phone]

## 2018-08-16 NOTE — Progress Notes (Signed)
Referring Physician(s): [place]  Supervising Physician: Aletta Edouard  Patient Status:  [name] - In-pt  Chief Complaint: Hematuria, left leg swelling/pain   Subjective: Patient familiar to IR service from prior Port-A-Cath placement on 06/21/2018.  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  Prior urine culture from last month grew staph aureus.  Current urine culture negative.  He has since been transfused.  He has a history of left lower extremity DVT recently noted in October of this year, treated with Xarelto.  He has had recurrent episodes of hematuria while on Xarelto. Latest bilat lower extremity venous Doppler study performed yesterday revealed age-indeterminate DVT involving left popliteal vein.  Current labs include WBC 12.9, hemoglobin 7.7, down slightly from 7.9 yesterday, platelets 340k, creatinine 0.96, PT 15.7, INR 1.26.  Patient has Foley cath in place draining yellow urine at this time.  He does have bilateral ureteral stents as well.  Request now received for IVC filter placement.  Additional medical history as below.  Past Medical History:  Diagnosis Date  . CHF (congestive heart failure) ([place])   . Hypertension   . Malignant neoplasm metastatic to lung ([place]) 04/19/2018  . MI (myocardial infarction) ([place])    2007  . 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 CYSTOSCOPY W/ URETERAL STENT PLACEMENT Bilateral 04/13/2018   Procedure: CYSTOSCOPY WITH BILATERAL STENT REPLACEMENT;  Surgeon: Irine Seal, MD;  Location: [facility];  Service: Urology;  Laterality: Bilateral;  . eye removed    . HERNIA REPAIR    . INTRAOCULAR PROSTHESES INSERTION    . IR IMAGING GUIDED PORT INSERTION  06/21/2018  . PROSTATE BIOPSY [name]  04/13/2018   Procedure: ULTRASOUND GUIDED PROSTATE BIOPSY;  Surgeon: Irine Seal, MD;  Location: [facility];  Service: Urology;  Laterality: [name];  . TOOTH EXTRACTION Right 04/21/2013   Procedure: EXTRACTION MOLARS;  Surgeon: [name], DDS;  Location: [facility];  Service: Oral Surgery;  Laterality: Right;  . TRANSURETHRAL RESECTION OF PROSTATE  04/13/2018   Procedure: TRANSURETHRAL RESECTION OF THE PROSTATE (TURP);  Surgeon: Irine Seal, MD;  Location: [facility];  Service: Urology;;  ]    Allergies: Pollen extract  Medications: Prior to Admission medications   Medication Sig Start Date End Date Taking? Authorizing Provider  albuterol (PROVENTIL HFA) 108 (90 Base) MCG/ACT inhaler Inhale 1-2 puffs into the lungs every 6 (six) hours as needed for wheezing or shortness of breath.   Yes [provider]  ALPRAZolam Duanne Moron) 0.5 MG tablet Take 0.5 mg by mouth 2 (two) times daily as needed for anxiety.  05/31/18  Yes [provider]  amoxicillin (AMOXIL) 500 MG capsule Take 1 capsule (500 mg total) by mouth 3 (three) times daily. 08/05/18  Yes Mikhail, Velta Addison, DO  bisacodyl (DULCOLAX) 10 MG suppository Place 1 suppository (10 mg total) rectally daily as needed for moderate constipation. 08/05/18  Yes Mikhail, Olla, DO  Docusate Calcium (STOOL SOFTENER PO) Take 1 tablet by mouth daily as needed (constipation).    Yes [provider]  fluticasone (FLONASE) 50 MCG/ACT nasal spray PLACE 2 SPRAYS INTO BOTH NOSTRILS DAILY. Patient taking differently: Place 2 sprays into both nostrils daily as needed for allergies.  07/22/14  Yes Le, Thao P, DO  hydrochlorothiazide (HYDRODIURIL) 25 MG tablet Take 25 mg by mouth daily.  05/02/18  Yes [provider]  hydrOXYzine (VISTARIL) 100 MG capsule TAKE ONE CAPSULE BY MOUTH AT BEDTIME AS NEEDED FOR ALLERGY Patient taking differently: Take 100 mg by mouth at bedtime as needed for itching.  10/12/15  Yes Robyn Haber, MD    lidocaine-prilocaine (EMLA) cream Apply 1 application topically as needed. Patient taking differently: Apply 1 application topically as needed (port access).  06/22/18  Yes [name], MD  metoprolol succinate (TOPROL-XL) 50 MG 24 hr tablet Take 50 mg by mouth daily. 05/17/18  Yes [provider]  ondansetron (ZOFRAN) 8 MG tablet Take 1 tablet (8 mg total) by mouth every 8 (eight) hours as needed for nausea or vomiting. 07/28/18  Yes [name], MD  oxyCODONE (OXY IR/ROXICODONE) 5 MG immediate release tablet Take 1-2 tablets (5-10 mg total) by mouth every 4 (four) hours as needed for severe pain. 07/25/18  Yes [name], MD  polyethylene glycol (MIRALAX / GLYCOLAX) packet Take 17 g by mouth 2 (two) times daily as needed for mild constipation or moderate constipation. 04/23/18  Yes Pollina, Gwenyth Allegra, MD  rivaroxaban (XARELTO) 20 MG TABS tablet Take 1 tablet (20 mg total) by mouth daily with supper. 07/17/18  Yes [name], MD  amLODipine (NORVASC) 10 MG tablet Take 1 tablet (10 mg total) by mouth daily. Patient not taking: Reported on 08/13/2018 04/19/18   Georgette Shell, MD     Vital Signs: BP 125/81   Pulse 100   Temp 98.4 F (36.9 C) (Oral)   Resp 18   Ht 5\' 11"  (1.803 m)   Wt 207 lb 7.3 oz (94.1 kg)   SpO2 100%   BMI 28.93 kg/m   Physical Exam awake, alert.  History of retinoblastoma left eye with enucleation.  Chest clear to auscultation bilaterally.  Clean, intact right chest wall Port-A-Cath.  Heart with slightly tachycardic but regular rhythm.  Abdomen soft, positive bowel sounds, nontender.  No sig right lower extremity edema.  2-3+ left lower extremity edema.  Imaging: Ct Abdomen Pelvis W Wo Contrast  Result Date: 08/14/2018 CLINICAL DATA:  metastatic sarcoma of the pelvis (arising from the prostate) with leg swelling and pain. Decreased hemoglobin. Recently started on blood thinners for deep venous thrombosis. Stent placement in August.  Recurrent hematuria. EXAM: CT ABDOMEN AND PELVIS WITHOUT AND WITH CONTRAST TECHNIQUE: Multidetector CT imaging of the abdomen and pelvis was performed following the standard protocol before and following the bolus administration of intravenous contrast. CONTRAST:  175mL OMNIPAQUE IOHEXOL 300 MG/ML  SOLN COMPARISON:  Renal ultrasound of 07/26/2018. Most recent CT of 05/22/2018. FINDINGS: Lower chest: Index left lower lobe pulmonary nodule measures 6 mm on image 15/9 and is decreased from 8 mm on the prior. Normal heart size without pericardial effusion. Trace left pleural fluid is new. Incompletely imaged central line. Hepatobiliary: Right hepatic lobe cyst and too small to characterize lesion, also likely a cyst. Normal gallbladder, without biliary ductal dilatation. Pancreas: Normal, without mass or ductal dilatation. Spleen: Normal in size, without focal abnormality. Adrenals/Urinary Tract: Normal adrenal glands. No renal calculi. Bilateral ureteric stents remain in place. No ureteric or bladder stones. No hydronephrosis. This stents terminate in the urinary bladder, which is anteriorly displaced and compressed by the below described tumor. The renal collecting systems are well opacified on delayed images, without filling defect. The ureters are not well opacified. No dominant enhancing bladder mass. The bladder  is not well opacified on delayed images, decompressed by a Foley catheter. Stomach/Bowel: Normal stomach, without wall thickening. Colonic stool burden suggests constipation. Normal terminal ileum and appendix. Normal small bowel. Vascular/Lymphatic: Normal caliber of the aorta and branch vessels. No abdominal adenopathy. A right external iliac node measures 10 mm on image 82/5 and is similar to minimally decreased from 11 mm on the prior. Reproductive: Central pelvic mass is heterogeneous and necrotic, including at 9.5 x 11.6 cm on image 75/5. Compare 9.4 x 12.6 cm on the prior exam (when remeasured). New  trace gas within, including on image 74/13. This is intimately associated with the posterior wall of the urinary bladder. Other: No significant free fluid. No free intraperitoneal air. Mild periumbilical ventral abdominal wall laxity. Musculoskeletal: Probable bone island in the left femoral head. IMPRESSION: 1. Similar size of a central pelvic mass, with new gas within, likely related to necrosis. This is intimately associated with the posterior wall of the urinary bladder, which is otherwise collapsed and suboptimally evaluated. Cannot exclude direct tumor involvement. 2. Otherwise, no explanation for hematuria. Suboptimal ureteric opacification, with ureteric stents in place. 3. Decrease in size of index left lower lobe pulmonary nodule, suggesting response to therapy of pulmonary metastasis. 4. Similar borderline right external iliac adenopathy, indeterminate. 5. Trace left pleural fluid. Electronically Signed   By: Abigail Miyamoto M.D.   On: 08/14/2018 20:40   Dg Chest 2 View  Result Date: 08/13/2018 CLINICAL DATA:  Swelling, bilateral leg pain, history of prostate cancer EXAM: CHEST - 2 VIEW COMPARISON:  08/04/2018 FINDINGS: Lungs are clear.  No pleural effusion or pneumothorax. The heart is normal in size. Right chest power port terminates in the lower SVC. Visualized osseous structures are within normal limits. IMPRESSION: Normal chest radiographs. Electronically Signed   By: Julian Hy M.D.   On: 08/13/2018 19:35   Vas [place] Lower Extremity Venous (dvt)  Result Date: 08/15/2018  Lower Venous Study Indications: Edema. Other Indications: Malignant neoplasm metastasis. Risk Factors: Chemotherapy Cancer history of leiomyosarcoma of prostate. Limitations: Body habitus, line and edema, patient is very sensitive for compression, immobility. Comparison Study: 08/02/2018 and 06/23/2018 Performing Technologist: Rudell Cobb  Examination Guidelines: A complete evaluation includes B-mode imaging, spectral  Doppler, color Doppler, and power Doppler as needed of all accessible portions of each vessel. Bilateral testing is considered an integral part of a complete examination. Limited examinations for reoccurring indications may be performed as noted.  Right Venous Findings: +---------+---------------+---------+-----------+----------+-------------+          CompressibilityPhasicitySpontaneityPropertiesSummary       +---------+---------------+---------+-----------+----------+-------------+ CFV      Full           Yes      Yes                                +---------+---------------+---------+-----------+----------+-------------+ SFJ      Full                                                       +---------+---------------+---------+-----------+----------+-------------+ FV Prox  Full                                                       +---------+---------------+---------+-----------+----------+-------------+  FV Mid   Full                                                       +---------+---------------+---------+-----------+----------+-------------+ FV DistalFull                                                       +---------+---------------+---------+-----------+----------+-------------+ PFV      Full                                                       +---------+---------------+---------+-----------+----------+-------------+ POP      Full           Yes      Yes                  sluggish flow +---------+---------------+---------+-----------+----------+-------------+ PTV      Full                                                       +---------+---------------+---------+-----------+----------+-------------+ PERO     Full                                                       +---------+---------------+---------+-----------+----------+-------------+ Gastroc  Full                                                        +---------+---------------+---------+-----------+----------+-------------+  Left Venous Findings: +---------+---------------+---------+-----------+----------+------------------+          CompressibilityPhasicitySpontaneityPropertiesSummary            +---------+---------------+---------+-----------+----------+------------------+ CFV      Full           Yes      Yes                                     +---------+---------------+---------+-----------+----------+------------------+ SFJ      Full                                                            +---------+---------------+---------+-----------+----------+------------------+ FV Prox  Full                                                            +---------+---------------+---------+-----------+----------+------------------+  FV Mid   Full                                                            +---------+---------------+---------+-----------+----------+------------------+ FV DistalFull                                                            +---------+---------------+---------+-----------+----------+------------------+ PFV      Full                                                            +---------+---------------+---------+-----------+----------+------------------+ POP      Partial        Yes      Yes                  see comment below. +---------+---------------+---------+-----------+----------+------------------+ PTV      Full                                                            +---------+---------------+---------+-----------+----------+------------------+ PERO     Full                                                            +---------+---------------+---------+-----------+----------+------------------+ Gastroc  Full                                                            +---------+---------------+---------+-----------+----------+------------------+ Popliteal  distal segment is difficult to compress, age indeterminate thrombosis    Summary: Right: There is no evidence of deep vein thrombosis in the lower extremity. However, portions of this examination were limited- see technologist comments above. No cystic structure found in the popliteal fossa. Left: Findings consistent with age indeterminate deep vein thrombosis involving the left popliteal vein. No cystic structure found in the popliteal fossa.  *See table(s) above for measurements and observations. Electronically signed by Deitra Mayo MD on 08/15/2018 at 5:11:18 PM.    Final     Labs:  CBC: Recent Labs    08/07/18 0921 08/13/18 1845 08/15/18 0348 08/16/18 0432  WBC 25.5* 9.4 15.2* 12.9*  HGB 8.2* 6.3* 7.9* 7.7*  HCT 25.1* 20.5* 25.1* 25.0*  PLT 498* 370 279 340    COAGS: Recent Labs    06/21/18 1232 06/27/18 1650  INR 0.92 2.24  APTT 29 55*    BMP: Recent Labs    08/07/18 8182  08/13/18 1845 08/15/18 0348 08/16/18 0432  NA 140 138 137 139  K 4.6 3.8 3.4* 3.6  CL 102 101 97* 99  CO2 27 26 30 30   GLUCOSE 109* 117* 107* 114*  BUN 15 13 14 14   CALCIUM 9.2 8.3* 8.2* 8.6*  CREATININE 1.29* 1.08 1.14 0.96  GFRNONAA >60 >60 >60 >60  GFRAA >60 >60 >60 >60    LIVER FUNCTION TESTS: Recent Labs    08/04/18 0537 08/05/18 0416 08/07/18 0921 08/13/18 1845  BILITOT 0.8 0.6 0.5 0.7  AST 24 36 43* 26  ALT 26 39 45* 31  ALKPHOS 101 91 142* 115  PROT 6.3* 5.9* 6.7 6.3*  ALBUMIN 3.0* 2.7* 2.7* 2.5*    Assessment and Plan: Pt with history of metastatic sarcoma of the pelvis, 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  Prior urine culture from last month grew staph aureus.  Current urine culture negative.  He has since been transfused.  He has a history of left lower extremity DVT recently noted in October of this year, treated with Xarelto.   He has had recurrent episodes of hematuria while on Xarelto. Latest bilat lower extremity venous Doppler study performed yesterday revealed age-indeterminate DVT involving left popliteal vein.  Current labs include WBC 12.9, hemoglobin 7.7, down slightly from 7.9 yesterday, platelets 340k, creatinine 0.96, PT 15.7, INR 1.26.  Patient has Foley cath in place draining yellow urine at this time.  He does have bilateral ureteral stents as well.  Request now received for IVC filter placement.  Case has been reviewed by Dr. Kathlene Cote.Risks and benefits discussed with the patient/spouse including, but not limited to bleeding, infection, contrast induced renal failure, filter fracture or migration which can lead to emergency surgery or even death, strut penetration with damage or irritation to adjacent structures and caval thrombosis.  All of the patient's questions were answered, patient is agreeable to proceed. Consent signed and in chart.  Procedure scheduled for this afternoon.   Electronically Signed: D. Rowe Robert, PA-C 08/16/2018, 10:58 AM   I spent a total of 30 minutes at the the patient's bedside AND on the patient's hospital floor or unit, greater than 50% of which was counseling/coordinating care for inferior vena cavagram with placement of IVC filter.    Patient ID: Dylan Gonzalez, male   DOB: Mar 24, 1966, 52 y.o.   MRN: [id number]

## 2018-08-17 ENCOUNTER — Encounter (HOSPITAL_COMMUNITY): Payer: Self-pay | Admitting: Interventional Radiology

## 2018-08-17 LAB — CBC
HCT: 25.5 % — ABNORMAL LOW (ref 39.0–52.0)
Hemoglobin: 7.9 g/dL — ABNORMAL LOW (ref 13.0–17.0)
MCH: 30.5 pg (ref 26.0–34.0)
MCHC: 31 g/dL (ref 30.0–36.0)
MCV: 98.5 fL (ref 80.0–100.0)
NRBC: 0.2 % (ref 0.0–0.2)
Platelets: 425 10*3/uL — ABNORMAL HIGH (ref 150–400)
RBC: 2.59 MIL/uL — ABNORMAL LOW (ref 4.22–5.81)
RDW: 17.7 % — ABNORMAL HIGH (ref 11.5–15.5)
WBC: 11.4 10*3/uL — ABNORMAL HIGH (ref 4.0–10.5)

## 2018-08-17 LAB — BASIC METABOLIC PANEL
Anion gap: 9 (ref 5–15)
BUN: 16 mg/dL (ref 6–20)
CO2: 32 mmol/L (ref 22–32)
CREATININE: 1.13 mg/dL (ref 0.61–1.24)
Calcium: 8.5 mg/dL — ABNORMAL LOW (ref 8.9–10.3)
Chloride: 99 mmol/L (ref 98–111)
GFR calc Af Amer: >60 mL/min (ref >60)
GFR calc non Af Amer: >60 mL/min (ref >60)
Glucose, Bld: 144 mg/dL — ABNORMAL HIGH (ref 70–99)
POTASSIUM: 3.8 mmol/L (ref 3.5–5.1)
Sodium: 140 mmol/L (ref 135–145)

## 2018-08-17 NOTE — Progress Notes (Signed)
PROGRESS NOTE    Dylan Gonzalez  JIR:[id number] DOB: [birth year] DOA: 08/13/2018 PCP: Donald Prose, MD      Brief Narrative:  Mr. Speros is a 52 y.o. 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  Admitted for transfusion, further workup.   Of note: Aug 2019: Admitted for prostate biopsy, required stent placement due to bulky tumor burden -->biopsy showed leiomyosarcaoma Jun 23, 2018: Seen in [place] office, US showed DVT, started Xarelto --> to ER that night with gross hematuria, discharged with Abx for UTI Jun 27, 2018: Again hematuria, evaluated by Urology, thought still to be due to UTI, anticoagulation continued, close follow up arranged Jul 29 2018: Hgb <7 g/dL at Oncology clinic, transfused 2 units Aug 01, 2018: Admitted for fever, vomiting, diagnosed with UTI, treated 3 days vanc/cefepime and discharged on amoxicillin for MSSA UTI    Assessment & Plan:  Acute blood loss anemia Gross hematuria Hematuria improved today.  Hgb appropriate bump post transfusion 2 units.  Xarelto is provoking bleeding in setting of following possible etiologies: cystitis, ureteritis from stent, direct invasion of bladder by sarcoma.    Discussed with Oncology, Urology --> we will stop Xarelto, monitor Hgb.  Stents will remain in place, be exchanged in a few weeks after next round chemo.   -Stop Xarelto -Trend Hgb 08/16/18: -Continue to monitor hemoglobin and transfuse as needed for hemoglobin less than 7. 08/17/18: - Hb stable at 7.9.  Leukocytosis UTI 08/16/18: - Cx NGTD 08/17/18: -Was on empiric cefepime. -Low-grade fever today.  Continue to monitor. -If remains afebrile then we can discontinue antibiotics.  Leg swelling, possible acute on chronic diastolic CHF Alternatively, this may be IVC compression given his tumor. -Most recent Doppler study showed age indeterminate DVT involving  left popliteal vein.  This is slightly better with Lasix.  Cr stable, K slightly low. -Continue IV Lasix BID -Supplement K -Strict I/Os, daily weights, daily BMP 08/17/18: -Lower extremity swelling likely secondary to the DVT. -Creatinine trending up therefore will discontinue the Lasix.  Metastatic sarcoma CT showed partial response of lung lesions, necrosis of main sarcoma in abdomen.  Recent lower extremity DVT -Stop Xarelto - consult IR for IVC filter 08/16/18: - Plan for IVC filter placement today per IR. 08/17/18: - IVC filter placed.  Hypertension BP normal -Continue metoprolol, HCTZ  08/16/18: -Continue to monitor blood pressure closely and adjust medications as needed. 08/17/18: -Blood pressure stable but he is a little tachycardic.  On metoprolol. Continue to monitor blood pressure, heart rate and adjust medications as needed.  Elevated troponin No chest pain.  Low flat.  Doubt ACS, no further ischemic work up recommended   DVT prophylaxis: SCDs Code Status: FULL Family Communication: No family at bedside MDM and disposition Plan: The below labs and imaging reports were reviewed and summarized above. Anticoagulant managed.  Case discussed with urology and oncology.  He was admitted with hematuria and acute blood loss anemia.  As well as leg swelling, which suspect is from acute on chronic diastolic CHF.  However, latest bilateral lower extremity venous Doppler study revealed age-indeterminate DVT involving left popliteal vein.  His anemia has been corrected, his Xarelto is being held.  Had placement of an IVC filter.  Likely home tomorrow.     Objective: Vitals:   08/16/18 1730 08/16/18 2001 08/17/18 0542 08/17/18 0631  BP: 126/85 114/63 122/78   Pulse: (!) 101 Marland Kitchen)  102 (!) 105   Resp: _0 Temp:  99.9 F (37.7 C) 99.3 F (37.4 C)   TempSrc:  Oral Oral   SpO2: 99% 96% 98%   Weight:    96.3 kg  Height:        Intake/Output Summary (Last 24  hours) at 08/17/2018 0935 Last data filed at 08/17/2018 0559 Gross per 24 hour  Intake 636 ml  Output 5650 ml  Net -5014 ml   Filed Weights   08/15/18 0556 08/16/18 0500 08/17/18 0631  Weight: 99 kg 94.1 kg 96.3 kg    Examination:   General: Adult male, sitting up in bed, responds appropriately to questions.  Eye contact, dress and hygiene appropriate. 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  No nasal deformity, discharge, or epistaxis OP moist without erythema, exudates, cobblestoning, or ulcers.  No airway deformities.  Neck supple.   Cardiac: Tachycardic, regular, no murmurs, JVP not visible, 2+ lower extremity edema. Left>right Respiratory: Normal respiratory rate and rhythm, lungs clear without rales or wheezes. Abdomen: Abdomen soft, nonfocal tenderness to palpation without guarding.  No distention or hepatosplenomegaly. Extremities: No deformities/injuries.  5/5 grip strength and upper extremity flexion/extension, symmetrically.  Extremities are warm and well-perfused. Neuro: Sensorium intact.  Speech is fluent.  Naming is grossly intact, and the patient's recall, recent and remote, as well as general fund of knowledge seem within normal limits.  Muscle tone normal, without fasciculations.  Upper extremity strength 5/5 and symmetric.  Lower extremities weak and early.  Attention span and concentration are within normal limits.  Psych: Mood stable.  Normal rate and rhythm of speech.  Thought content appropriate, and thought process linear.  No SI/HI, aural or visual hallucinations or delusions. Attention and concentration are normal.    Data Reviewed: I have personally reviewed following labs and imaging studies:  CBC: Recent Labs  Lab 08/13/18 1845 08/15/18 0348 08/16/18 0432 08/17/18 0357  WBC 9.4 15.2* 12.9* 11.4*  NEUTROABS 6.2  --   --   --   HGB 6.3* 7.9* 7.7* 7.9*  HCT 20.5* 25.1* 25.0* 25.5*  MCV 103.5* 97.7 98.0 98.5  PLT 370 279 340 395*   Basic Metabolic  Panel: Recent Labs  Lab 08/13/18 1845 08/15/18 0348 08/16/18 0432 08/17/18 0357  NA 138 137 139 140  K 3.8 3.4* 3.6 3.8  CL 101 97* 99 99  CO2 _1 32  GLUCOSE 117* 107* 114* 144*  BUN _2 CREATININE 1.08 1.14 0.96 1.13  CALCIUM 8.3* 8.2* 8.6* 8.5*   GFR: Estimated Creatinine Clearance: 90.5 mL/min (by C-G formula based on SCr of 1.13 mg/dL). Liver Function Tests: Recent Labs  Lab 08/13/18 1845  AST 26  ALT 31  ALKPHOS 115  BILITOT 0.7  PROT 6.3*  ALBUMIN 2.5*   No results for input(s): LIPASE, AMYLASE in the last 168 hours. No results for input(s): AMMONIA in the last 168 hours. Coagulation Profile: Recent Labs  Lab 08/16/18 1038  INR 1.26   Cardiac Enzymes: Recent Labs  Lab 08/13/18 1845 08/14/18 1444  TROPONINI 0.03* 0.04*   BNP (last 3 results) No results for input(s): PROBNP in the last 8760 hours. HbA1C: No results for input(s): HGBA1C in the last 72 hours. CBG: No results for input(s): GLUCAP in the last 168 hours. Lipid Profile: No results for input(s): CHOL, HDL, LDLCALC, TRIG, CHOLHDL, LDLDIRECT in the last 72 hours. Thyroid Function Tests: No results for input(s): TSH, T4TOTAL, FREET4, T3FREE, THYROIDAB  in the last 72 hours. Anemia Panel: No results for input(s): VITAMINB12, FOLATE, FERRITIN, TIBC, IRON, RETICCTPCT in the last 72 hours. Urine analysis:    Component Value Date/Time   COLORURINE YELLOW 08/13/2018 2343   APPEARANCEUR HAZY (A) 08/13/2018 2343   LABSPEC 1.012 08/13/2018 2343   PHURINE 6.0 08/13/2018 2343   GLUCOSEU NEGATIVE 08/13/2018 2343   HGBUR LARGE (A) 08/13/2018 2343   BILIRUBINUR NEGATIVE 08/13/2018 2343   BILIRUBINUR neg 06/01/2014 1333   KETONESUR NEGATIVE 08/13/2018 2343   PROTEINUR 100 (A) 08/13/2018 2343   UROBILINOGEN 0.2 04/19/2015 0907   NITRITE NEGATIVE 08/13/2018 2343   LEUKOCYTESUR TRACE (A) 08/13/2018 2343   Sepsis Labs: _0 (procalcitonin:4,lacticacidven:4)  ) Recent Results  (from the past 240 hour(s))  Culture, Urine     Status: None   Collection Time: 08/14/18  3:34 AM  Result Value Ref Range Status   Specimen Description   Final    URINE, CLEAN CATCH Performed at [facility], [place] [address] [place], [place] [address]    Special Requests   Final    NONE Performed at [facility], [place] [address], [place], [place] [address]    Culture   Final    NO GROWTH Performed at [facility], [place] [address] [place], [place] [address]    Report Status 08/15/2018 FINAL  Final         Radiology Studies: Ir Ivc Filter Plmt / S&i /img Guid/mod Sed  Result Date: 08/17/2018 CLINICAL DATA:  History of metastatic sarcoma and left lower extremity DVT. Anticoagulation could not be tolerated due to persistent gross hematuria. Request has been made to place an IVC filter. EXAM: 1. ULTRASOUND GUIDANCE FOR VASCULAR ACCESS OF THE RIGHT INTERNAL JUGULAR VEIN. 2. IVC VENOGRAM. 3. PERCUTANEOUS IVC FILTER PLACEMENT. ANESTHESIA/SEDATION: 1.0 mg IV Versed; 50 mcg IV Fentanyl. Total Moderate Sedation Time: Fourteen minutes. The patient's level of consciousness and physiologic status were continuously monitored during the procedure by Radiology nursing. A time-out was performed prior to initiating the procedure. CONTRAST:  34 mL Isovue-300 FLUOROSCOPY TIME:  1 minutes and 54 seconds.  140 mGy. PROCEDURE: The procedure, risks, benefits, and alternatives were explained to the patient. Questions regarding the procedure were encouraged and answered. The patient understands and consents to the procedure. A time-out was performed prior to initiating the procedure. The right neck was prepped with chlorhexidine in a sterile fashion, and a sterile drape was applied covering the operative field. A sterile gown and sterile gloves were used for the procedure. Local anesthesia was provided with 1% Lidocaine. Ultrasound was utilized to confirm patency of the  right internal jugular vein. Under direct ultrasound guidance, a 21 gauge needle was advanced into the right internal jugular vein with ultrasound image documentation performed. After securing access with a micropuncture dilator, a guidewire was advanced into the inferior vena cava. A deployment sheath was advanced over the guidewire. This was utilized to perform IVC venography. The deployment sheath was further positioned in an appropriate location for filter deployment. A Bard Denali IVC filter was then advanced in the sheath. This was then fully deployed in the infrarenal IVC. Final filter position was confirmed with a fluoroscopic spot image. After the procedure the sheath was removed and hemostasis obtained with manual compression. COMPLICATIONS: None. FINDINGS: IVC venography demonstrates a normal caliber IVC with no evidence of thrombus. Renal veins are identified bilaterally. The IVC filter was successfully positioned below the level of the renal veins and is appropriately  oriented. This IVC filter has both permanent and retrievable indications. IMPRESSION: Placement of percutaneous IVC filter in infrarenal IVC. IVC venogram shows no evidence of IVC thrombus and normal caliber of the inferior vena cava. This filter does have both permanent and retrievable indications. PLAN: This IVC filter is potentially retrievable. The patient will be assessed for filter retrieval by Interventional Radiology in approximately 8-12 weeks. Further recommendations regarding filter retrieval, continued surveillance or declaration of device permanence, will be made at that time. Electronically Signed   By: Aletta Edouard M.D.   On: 08/17/2018 08:21   Vas [place] Lower Extremity Venous (dvt)  Result Date: 08/15/2018  Lower Venous Study Indications: Edema. Other Indications: Malignant neoplasm metastasis. Risk Factors: Chemotherapy Cancer history of leiomyosarcoma of prostate. Limitations: Body habitus, line and edema, patient is  very sensitive for compression, immobility. Comparison Study: 08/02/2018 and 06/23/2018 Performing Technologist: Rudell Cobb  Examination Guidelines: A complete evaluation includes B-mode imaging, spectral Doppler, color Doppler, and power Doppler as needed of all accessible portions of each vessel. Bilateral testing is considered an integral part of a complete examination. Limited examinations for reoccurring indications may be performed as noted.  Right Venous Findings: +---------+---------------+---------+-----------+----------+-------------+          CompressibilityPhasicitySpontaneityPropertiesSummary       +---------+---------------+---------+-----------+----------+-------------+ CFV      Full           Yes      Yes                                +---------+---------------+---------+-----------+----------+-------------+ SFJ      Full                                                       +---------+---------------+---------+-----------+----------+-------------+ FV Prox  Full                                                       +---------+---------------+---------+-----------+----------+-------------+ FV Mid   Full                                                       +---------+---------------+---------+-----------+----------+-------------+ FV DistalFull                                                       +---------+---------------+---------+-----------+----------+-------------+ PFV      Full                                                       +---------+---------------+---------+-----------+----------+-------------+ POP      Full           Yes  Yes                  sluggish flow +---------+---------------+---------+-----------+----------+-------------+ PTV      Full                                                       +---------+---------------+---------+-----------+----------+-------------+ PERO     Full                                                        +---------+---------------+---------+-----------+----------+-------------+ Gastroc  Full                                                       +---------+---------------+---------+-----------+----------+-------------+  Left Venous Findings: +---------+---------------+---------+-----------+----------+------------------+          CompressibilityPhasicitySpontaneityPropertiesSummary            +---------+---------------+---------+-----------+----------+------------------+ CFV      Full           Yes      Yes                                     +---------+---------------+---------+-----------+----------+------------------+ SFJ      Full                                                            +---------+---------------+---------+-----------+----------+------------------+ FV Prox  Full                                                            +---------+---------------+---------+-----------+----------+------------------+ FV Mid   Full                                                            +---------+---------------+---------+-----------+----------+------------------+ FV DistalFull                                                            +---------+---------------+---------+-----------+----------+------------------+ PFV      Full                                                            +---------+---------------+---------+-----------+----------+------------------+  POP      Partial        Yes      Yes                  see comment below. +---------+---------------+---------+-----------+----------+------------------+ PTV      Full                                                            +---------+---------------+---------+-----------+----------+------------------+ PERO     Full                                                             +---------+---------------+---------+-----------+----------+------------------+ Gastroc  Full                                                            +---------+---------------+---------+-----------+----------+------------------+ Popliteal distal segment is difficult to compress, age indeterminate thrombosis    Summary: Right: There is no evidence of deep vein thrombosis in the lower extremity. However, portions of this examination were limited- see technologist comments above. No cystic structure found in the popliteal fossa. Left: Findings consistent with age indeterminate deep vein thrombosis involving the left popliteal vein. No cystic structure found in the popliteal fossa.  *See table(s) above for measurements and observations. Electronically signed by Deitra Mayo MD on 08/15/2018 at 5:11:18 PM.    Final         Scheduled Meds: . furosemide  40 mg Intravenous BID  . hydrochlorothiazide  25 mg Oral Daily  . metoprolol succinate  50 mg Oral Daily  . potassium chloride  40 mEq Oral BID   Continuous Infusions: . ceFEPime (MAXIPIME) IV 2 g (08/17/18 0024)     LOS: 3 days    Time spent: 25 minutes    Yaakov Guthrie, MD [facility] 08/17/2018, 9:35 AM     Pager [phone] --- please page though AMION:  [email or web address] Password [email or web address] If 7PM-7AM, please contact night-coverage

## 2018-08-18 ENCOUNTER — Other Ambulatory Visit: Payer: Self-pay

## 2018-08-18 ENCOUNTER — Ambulatory Visit: Payer: BLUE CROSS/BLUE SHIELD

## 2018-08-18 DIAGNOSIS — D649 Anemia, unspecified: Secondary | ICD-10-CM

## 2018-08-18 DIAGNOSIS — I824Y2 Acute embolism and thrombosis of unspecified deep veins of left proximal lower extremity: Secondary | ICD-10-CM

## 2018-08-18 DIAGNOSIS — C494 Malignant neoplasm of connective and soft tissue of abdomen: Secondary | ICD-10-CM

## 2018-08-18 LAB — BASIC METABOLIC PANEL
Anion gap: 8 (ref 5–15)
BUN: 20 mg/dL (ref 6–20)
CO2: 31 mmol/L (ref 22–32)
Calcium: 8.5 mg/dL — ABNORMAL LOW (ref 8.9–10.3)
Chloride: 99 mmol/L (ref 98–111)
Creatinine, Ser: 1.16 mg/dL (ref 0.61–1.24)
GFR calc Af Amer: >60 mL/min (ref >60)
GFR calc non Af Amer: >60 mL/min (ref >60)
Glucose, Bld: 113 mg/dL — ABNORMAL HIGH (ref 70–99)
POTASSIUM: 4 mmol/L (ref 3.5–5.1)
Sodium: 138 mmol/L (ref 135–145)

## 2018-08-18 LAB — CBC
HCT: 25.2 % — ABNORMAL LOW (ref 39.0–52.0)
Hemoglobin: 7.7 g/dL — ABNORMAL LOW (ref 13.0–17.0)
MCH: 30.6 pg (ref 26.0–34.0)
MCHC: 30.6 g/dL (ref 30.0–36.0)
MCV: 100 fL (ref 80.0–100.0)
Platelets: 452 10*3/uL — ABNORMAL HIGH (ref 150–400)
RBC: 2.52 MIL/uL — ABNORMAL LOW (ref 4.22–5.81)
RDW: 18.2 % — ABNORMAL HIGH (ref 11.5–15.5)
WBC: 13.6 10*3/uL — ABNORMAL HIGH (ref 4.0–10.5)
nRBC: 0 % (ref 0.0–0.2)

## 2018-08-18 MED ORDER — OXYCODONE HCL 5 MG PO TABS: 5.0000 mg | ORAL_TABLET | ORAL | 0 refills | Status: DC | PRN

## 2018-08-18 MED ORDER — HEPARIN SOD (PORK) LOCK FLUSH 100 UNIT/ML IV SOLN
500.0000 [IU] | INTRAVENOUS | Status: AC | PRN
  Administered 2018-08-18: 500 [IU]

## 2018-08-18 NOTE — Care Management Note (Signed)
Case Management Note  Patient Details  Name: Dylan Gonzalez MRN: [id number] Date of Birth: 09-Apr-1966  Subjective/Objective:                  discharged  Action/Plan: Discharged to home with self-care, orders checked for hhc needs. No CM needs present at time of discharge.  Patient is able to arrangement own appointments and home care.  Expected Discharge Date:  08/18/18               Expected Discharge Plan:  Home/Self Care  In-House Referral:     Discharge planning Services  CM Consult  Post Acute Care Choice:    Choice offered to:     DME Arranged:    DME Agency:     HH Arranged:    HH Agency:     Status of Service:  Completed, signed off  If discussed at [employer] of Stay Meetings, dates discussed:    Additional Comments:  Leeroy Cha, RN 08/18/2018, 3:40 PM

## 2018-08-18 NOTE — Progress Notes (Signed)
Dylan Gonzalez to be D/C'd Home per MD order.  Discussed prescriptions and follow up appointments with the patient. Prescriptions given to patient, medication list explained in detail. Pt verbalized understanding.  Allergies as of 08/18/2018      Reactions   Pollen Extract Other (See Comments)   Sneezing and shortness of breath      Medication List    STOP taking these medications   amLODipine 10 MG tablet Commonly known as:  NORVASC   amoxicillin 500 MG capsule Commonly known as:  AMOXIL   hydrochlorothiazide 25 MG tablet Commonly known as:  HYDRODIURIL   rivaroxaban 20 MG Tabs tablet Commonly known as:  XARELTO     TAKE these medications   ALPRAZolam 0.5 MG tablet Commonly known as:  XANAX Take 0.5 mg by mouth 2 (two) times daily as needed for anxiety.   bisacodyl 10 MG suppository Commonly known as:  DULCOLAX Place 1 suppository (10 mg total) rectally daily as needed for moderate constipation.   fluticasone 50 MCG/ACT nasal spray Commonly known as:  FLONASE PLACE 2 SPRAYS INTO BOTH NOSTRILS DAILY. What changed:  See the new instructions.   hydrOXYzine 100 MG capsule Commonly known as:  VISTARIL TAKE ONE CAPSULE BY MOUTH AT BEDTIME AS NEEDED FOR ALLERGY What changed:    how much to take  how to take this  when to take this  reasons to take this  additional instructions   lidocaine-prilocaine cream Commonly known as:  EMLA Apply 1 application topically as needed. What changed:  reasons to take this   metoprolol succinate 50 MG 24 hr tablet Commonly known as:  TOPROL-XL Take 50 mg by mouth daily.   ondansetron 8 MG tablet Commonly known as:  ZOFRAN Take 1 tablet (8 mg total) by mouth every 8 (eight) hours as needed for nausea or vomiting.   oxyCODONE 5 MG immediate release tablet Commonly known as:  Oxy IR/ROXICODONE Take 1-2 tablets (5-10 mg total) by mouth every 4 (four) hours as needed for severe pain.   polyethylene glycol packet Commonly  known as:  MIRALAX / GLYCOLAX Take 17 g by mouth 2 (two) times daily as needed for mild constipation or moderate constipation.   PROVENTIL HFA 108 (90 Base) MCG/ACT inhaler Generic drug:  albuterol Inhale 1-2 puffs into the lungs every 6 (six) hours as needed for wheezing or shortness of breath.   STOOL SOFTENER PO Take 1 tablet by mouth daily as needed (constipation).       Vitals:   08/18/18 0623 08/18/18 1413  BP: 118/77 130/74  Pulse: (!) 103 (!) 106  Resp: 16 16  Temp: 98.4 F (36.9 C) 100.2 F (37.9 C)  SpO2: 98% 97%    Skin clean, dry and intact without evidence of skin break down, no evidence of skin tears noted. IV catheter discontinued intact. Site without signs and symptoms of complications. Dressing and pressure applied. Pt denies pain at this time. No complaints noted.  An After Visit Summary was printed and given to the patient. Patient escorted via [place], and D/C home via private auto.  Lolita Rieger 08/18/2018 5:23 PM

## 2018-08-18 NOTE — Discharge Summary (Signed)
Physician Discharge Summary  Dylan Gonzalez GYF:[id number] DOB: 03-12-1966 DOA: 08/13/2018  PCP: Donald Prose, MD  Admit date: 08/13/2018 Discharge date: 08/18/2018  Admitted From: Home Disposition: Home  Recommendations for Outpatient Follow-up:  1. Follow up with PCP in 1-2 weeks 2. Please obtain BMP/CBC in one week 3. Please follow up on the following pending results:  Home Health:No Equipment/Devices:No  Discharge Condition: Stable CODE STATUS: Full Diet recommendation: Heart Healthy  Brief/Interim Summary: Dylan Gonzalez is a 52 y.o. 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  Admitted for transfusion, further workup.  Prior history: Aug 2019: Admitted for prostate biopsy, required stent placement due to bulky tumor burden -->biopsy showed leiomyosarcaoma Jun 23, 2018: Seen in [place] office, US showed DVT, started Xarelto --> to ER that night with gross hematuria, discharged with Abx for UTI Jun 27, 2018: Again hematuria, evaluated by Urology, thought still to be due to UTI, anticoagulation continued, close follow up arranged Jul 29 2018: Hgb <7 g/dL at Oncology clinic, transfused 2 units Aug 01, 2018: Admitted for fever, vomiting, diagnosed with UTI, treated 3 days vanc/cefepime and discharged on amoxicillin for MSSA UTI   Following is the problem list: Acute blood loss anemia Gross hematuria Status post transfusion 2 units. Xarelto is provoking bleeding in setting of following possible etiologies: cystitis, ureteritis from stent, direct invasion of bladder by sarcoma.    Discussed with Oncology, Urology --> we will stop Xarelto, monitor Hgb.  Stents will remain in place, be exchanged in a few weeks after next round chemo.   -Stop Xarelto -Hemoglobin stable at 7.7. -Hematuria resolved.  I advised him to follow-up with his outpatient physician.  Leukocytosis UTI? - Cx  NGTD -Was treated with empiric cefepime. -He had low-grade fever but that could also be secondary to the DVT, cancer. -WBC count today 13.6 but better than 15.2 when he initially presented. -He denies having any urinary symptoms at this time.  Leg swelling, possible acute on chronic diastolic CHF Alternatively, this may be IVC compression given his tumor. -Most recent Doppler study showed age indeterminate DVT involving left popliteal vein.  -Was diuresed with Lasix.  Supplemented K -He still has some swelling despite diuresis with Lasix. -Lower extremity swelling likely secondary to the DVT. -Creatinine trending up therefore will discontinued the Lasix and HCTZ.  Metastatic sarcoma CT showed partial response of lung lesions, necrosis of main sarcoma in abdomen. -Advised to follow-up with his outpatient physician.  Recent lower extremity DVT -Stopped Xarelto - consulted IR for IVC filter 08/17/18: - IVC filter placed.  Patient tolerated the procedure well.  Hypertension -Blood pressure soft but stable but was a little tachycardic.  Continued on metoprolol.  Patient says tachycardia has improved.   He is advised to follow-up with his outpatient physician to monitor blood pressure, heart rate and adjust medications as needed in the outpatient setting.  Elevated troponin Denies chest pain.  Low flat.  Doubt ACS, no further ischemic work up recommended  Patient at this time says he feels better and is okay to be discharged home.  However, given his complex medical problems he is a very high risk for rehospitalization.  Discharge Diagnoses:  Principal Problem:   Symptomatic anemia Active Problems:   Primary leiomyosarcoma of intra-abdominal site (HCC)   Bilateral lower extremity edema   CAD (coronary artery disease)   Deep vein thrombosis (DVT) of left lower  extremity ([place])   Anemia    Discharge Instructions Discharge plan of care discussed with the patient.  I also  discussed the plan of care with the patient's father and his wife over the phone.  Answered all of their questions appropriately to the best of my knowledge.  They are agreeable with the patient being discharged home.  However, given his complex medical problems he is a very high risk for rehospitalization.  Allergies as of 08/18/2018      Reactions   Pollen Extract Other (See Comments)   Sneezing and shortness of breath      Medication List    STOP taking these medications   amLODipine 10 MG tablet Commonly known as:  NORVASC   amoxicillin 500 MG capsule Commonly known as:  AMOXIL   hydrochlorothiazide 25 MG tablet Commonly known as:  HYDRODIURIL   rivaroxaban 20 MG Tabs tablet Commonly known as:  XARELTO     TAKE these medications   ALPRAZolam 0.5 MG tablet Commonly known as:  XANAX Take 0.5 mg by mouth 2 (two) times daily as needed for anxiety.   bisacodyl 10 MG suppository Commonly known as:  DULCOLAX Place 1 suppository (10 mg total) rectally daily as needed for moderate constipation.   fluticasone 50 MCG/ACT nasal spray Commonly known as:  FLONASE PLACE 2 SPRAYS INTO BOTH NOSTRILS DAILY. What changed:  See the new instructions.   hydrOXYzine 100 MG capsule Commonly known as:  VISTARIL TAKE ONE CAPSULE BY MOUTH AT BEDTIME AS NEEDED FOR ALLERGY What changed:    how much to take  how to take this  when to take this  reasons to take this  additional instructions   lidocaine-prilocaine cream Commonly known as:  EMLA Apply 1 application topically as needed. What changed:  reasons to take this   metoprolol succinate 50 MG 24 hr tablet Commonly known as:  TOPROL-XL Take 50 mg by mouth daily.   ondansetron 8 MG tablet Commonly known as:  ZOFRAN Take 1 tablet (8 mg total) by mouth every 8 (eight) hours as needed for nausea or vomiting.   oxyCODONE 5 MG immediate release tablet Commonly known as:  Oxy IR/ROXICODONE Take 1-2 tablets (5-10 mg total) by  mouth every 4 (four) hours as needed for severe pain.   polyethylene glycol packet Commonly known as:  MIRALAX / GLYCOLAX Take 17 g by mouth 2 (two) times daily as needed for mild constipation or moderate constipation.   PROVENTIL HFA 108 (90 Base) MCG/ACT inhaler Generic drug:  albuterol Inhale 1-2 puffs into the lungs every 6 (six) hours as needed for wheezing or shortness of breath.   STOOL SOFTENER PO Take 1 tablet by mouth daily as needed (constipation).       Allergies  Allergen Reactions  . Pollen Extract Other (See Comments)    Sneezing and shortness of breath    Consultations:  Urology  IR   Procedures/Studies: Ct Abdomen Pelvis W Wo Contrast  Result Date: 08/14/2018 CLINICAL DATA:  metastatic sarcoma of the pelvis (arising from the prostate) with leg swelling and pain. Decreased hemoglobin. Recently started on blood thinners for deep venous thrombosis. Stent placement in August. Recurrent hematuria. EXAM: CT ABDOMEN AND PELVIS WITHOUT AND WITH CONTRAST TECHNIQUE: Multidetector CT imaging of the abdomen and pelvis was performed following the standard protocol before and following the bolus administration of intravenous contrast. CONTRAST:  196m OMNIPAQUE IOHEXOL 300 MG/ML  SOLN COMPARISON:  Renal ultrasound of 07/26/2018. Most recent CT of 05/22/2018. FINDINGS:  Lower chest: Index left lower lobe pulmonary nodule measures 6 mm on image 15/9 and is decreased from 8 mm on the prior. Normal heart size without pericardial effusion. Trace left pleural fluid is new. Incompletely imaged central line. Hepatobiliary: Right hepatic lobe cyst and too small to characterize lesion, also likely a cyst. Normal gallbladder, without biliary ductal dilatation. Pancreas: Normal, without mass or ductal dilatation. Spleen: Normal in size, without focal abnormality. Adrenals/Urinary Tract: Normal adrenal glands. No renal calculi. Bilateral ureteric stents remain in place. No ureteric or bladder  stones. No hydronephrosis. This stents terminate in the urinary bladder, which is anteriorly displaced and compressed by the below described tumor. The renal collecting systems are well opacified on delayed images, without filling defect. The ureters are not well opacified. No dominant enhancing bladder mass. The bladder is not well opacified on delayed images, decompressed by a Foley catheter. Stomach/Bowel: Normal stomach, without wall thickening. Colonic stool burden suggests constipation. Normal terminal ileum and appendix. Normal small bowel. Vascular/Lymphatic: Normal caliber of the aorta and branch vessels. No abdominal adenopathy. A right external iliac node measures 10 mm on image 82/5 and is similar to minimally decreased from 11 mm on the prior. Reproductive: Central pelvic mass is heterogeneous and necrotic, including at 9.5 x 11.6 cm on image 75/5. Compare 9.4 x 12.6 cm on the prior exam (when remeasured). New trace gas within, including on image 74/13. This is intimately associated with the posterior wall of the urinary bladder. Other: No significant free fluid. No free intraperitoneal air. Mild periumbilical ventral abdominal wall laxity. Musculoskeletal: Probable bone island in the left femoral head. IMPRESSION: 1. Similar size of a central pelvic mass, with new gas within, likely related to necrosis. This is intimately associated with the posterior wall of the urinary bladder, which is otherwise collapsed and suboptimally evaluated. Cannot exclude direct tumor involvement. 2. Otherwise, no explanation for hematuria. Suboptimal ureteric opacification, with ureteric stents in place. 3. Decrease in size of index left lower lobe pulmonary nodule, suggesting response to therapy of pulmonary metastasis. 4. Similar borderline right external iliac adenopathy, indeterminate. 5. Trace left pleural fluid. Electronically Signed   By: Abigail Miyamoto M.D.   On: 08/14/2018 20:40   Dg Chest 2 View  Result Date:  08/13/2018 CLINICAL DATA:  Swelling, bilateral leg pain, history of prostate cancer EXAM: CHEST - 2 VIEW COMPARISON:  08/04/2018 FINDINGS: Lungs are clear.  No pleural effusion or pneumothorax. The heart is normal in size. Right chest power port terminates in the lower SVC. Visualized osseous structures are within normal limits. IMPRESSION: Normal chest radiographs. Electronically Signed   By: Julian Hy M.D.   On: 08/13/2018 19:35   Dg Chest 2 View  Result Date: 08/01/2018 CLINICAL DATA:  52 y/o M; receiving treatment for prostate and bladder cancer. Fever and nausea. EXAM: CHEST - 2 VIEW COMPARISON:  06/29/2018 chest radiograph. FINDINGS: Stable normal cardiac silhouette given projection and technique. Stable port catheter tip projecting over upper SVC. Aortic atherosclerosis with calcification. Clear lungs. No pleural effusion or pneumothorax. No acute osseous abnormality is evident. IMPRESSION: No active cardiopulmonary disease. Electronically Signed   By: Kristine Garbe M.D.   On: 08/01/2018 20:31   Ir Ivc Filter Plmt / S&i /img Guid/mod Sed  Result Date: 08/17/2018 CLINICAL DATA:  History of metastatic sarcoma and left lower extremity DVT. Anticoagulation could not be tolerated due to persistent gross hematuria. Request has been made to place an IVC filter. EXAM: 1. ULTRASOUND GUIDANCE FOR VASCULAR ACCESS  OF THE RIGHT INTERNAL JUGULAR VEIN. 2. IVC VENOGRAM. 3. PERCUTANEOUS IVC FILTER PLACEMENT. ANESTHESIA/SEDATION: 1.0 mg IV Versed; 50 mcg IV Fentanyl. Total Moderate Sedation Time: Fourteen minutes. The patient's level of consciousness and physiologic status were continuously monitored during the procedure by Radiology nursing. A time-out was performed prior to initiating the procedure. CONTRAST:  34 mL Isovue-300 FLUOROSCOPY TIME:  1 minutes and 54 seconds.  140 mGy. PROCEDURE: The procedure, risks, benefits, and alternatives were explained to the patient. Questions regarding the  procedure were encouraged and answered. The patient understands and consents to the procedure. A time-out was performed prior to initiating the procedure. The right neck was prepped with chlorhexidine in a sterile fashion, and a sterile drape was applied covering the operative field. A sterile gown and sterile gloves were used for the procedure. Local anesthesia was provided with 1% Lidocaine. Ultrasound was utilized to confirm patency of the right internal jugular vein. Under direct ultrasound guidance, a 21 gauge needle was advanced into the right internal jugular vein with ultrasound image documentation performed. After securing access with a micropuncture dilator, a guidewire was advanced into the inferior vena cava. A deployment sheath was advanced over the guidewire. This was utilized to perform IVC venography. The deployment sheath was further positioned in an appropriate location for filter deployment. A Bard Denali IVC filter was then advanced in the sheath. This was then fully deployed in the infrarenal IVC. Final filter position was confirmed with a fluoroscopic spot image. After the procedure the sheath was removed and hemostasis obtained with manual compression. COMPLICATIONS: None. FINDINGS: IVC venography demonstrates a normal caliber IVC with no evidence of thrombus. Renal veins are identified bilaterally. The IVC filter was successfully positioned below the level of the renal veins and is appropriately oriented. This IVC filter has both permanent and retrievable indications. IMPRESSION: Placement of percutaneous IVC filter in infrarenal IVC. IVC venogram shows no evidence of IVC thrombus and normal caliber of the inferior vena cava. This filter does have both permanent and retrievable indications. PLAN: This IVC filter is potentially retrievable. The patient will be assessed for filter retrieval by Interventional Radiology in approximately 8-12 weeks. Further recommendations regarding filter  retrieval, continued surveillance or declaration of device permanence, will be made at that time. Electronically Signed   By: Aletta Edouard M.D.   On: 08/17/2018 08:21   US Renal  Result Date: 08/03/2018 CLINICAL DATA:  Prostate leiomyosarcoma.  Acute renal insufficiency. EXAM: RENAL / URINARY TRACT ULTRASOUND COMPLETE COMPARISON:  CT scan May 22, 2018 FINDINGS: Right Kidney: Renal measurements: 11.6 x 4.4 x 5.2 cm = volume: 139.6 mL. The extrarenal pelvis is mildly prominent without hydronephrosis. Left Kidney: Renal measurements: 11.2 x 5.6 x 5.8 cm = volume: 190.7 mL. There is mild left-sided hydronephrosis. A hyperechoic structure seen in the left renal pelvis. Bladder: There is a pelvic mass measuring 13.8 x 9.5 x 9.3 cm, consistent with the patient's known malignancy. The bladder is decompressed with a Foley catheter and is displaced anteriorly by the pelvic mass. IMPRESSION: 1. There is mild left-sided hydronephrosis which is new compared to September 2019. 2. There is a hyperechoic structure in the dilated left renal pelvis. The patient had a ureteral stent extending from the left renal pelvis to the bladder on the September 2019 CT. Is it possible that the hyperechoic structure in the left renal pelvis is part of the patient's stent? Recommend clinical correlation. A stone is not excluded on this study. However, there were no left-sided  stones on the comparison CT scan just over 2 months ago. 3. Large left pelvic mass/known malignancy. 4. The bladder is poorly assessed as it is decompressed with a Foley catheter and displaced anteriorly by the pelvic mass. Electronically Signed   By: Dorise Bullion III M.D   On: 08/03/2018 16:56   Dg Chest Port 1 View  Result Date: 08/04/2018 CLINICAL DATA:  Fever, leg swelling. EXAM: PORTABLE CHEST 1 VIEW COMPARISON:  Frontal and lateral views 08/01/2018. chest CT 01/19/2018 FINDINGS: Low lung volumes. Right chest port with tip in the mid SVC.The  cardiomediastinal contours are normal. Subcentimeter pulmonary nodules on prior CT are not well seen radiographically. Pulmonary vasculature is normal. No consolidation, pleural effusion, or pneumothorax. No acute osseous abnormalities are seen. IMPRESSION: Low lung volumes without acute chest findings. Electronically Signed   By: Keith Rake M.D.   On: 08/04/2018 05:59   Vas [place] Lower Extremity Venous (dvt)  Result Date: 08/15/2018  Lower Venous Study Indications: Edema. Other Indications: Malignant neoplasm metastasis. Risk Factors: Chemotherapy Cancer history of leiomyosarcoma of prostate. Limitations: Body habitus, line and edema, patient is very sensitive for compression, immobility. Comparison Study: 08/02/2018 and 06/23/2018 Performing Technologist: Rudell Cobb  Examination Guidelines: A complete evaluation includes B-mode imaging, spectral Doppler, color Doppler, and power Doppler as needed of all accessible portions of each vessel. Bilateral testing is considered an integral part of a complete examination. Limited examinations for reoccurring indications may be performed as noted.  Right Venous Findings: +---------+---------------+---------+-----------+----------+-------------+          CompressibilityPhasicitySpontaneityPropertiesSummary       +---------+---------------+---------+-----------+----------+-------------+ CFV      Full           Yes      Yes                                +---------+---------------+---------+-----------+----------+-------------+ SFJ      Full                                                       +---------+---------------+---------+-----------+----------+-------------+ FV Prox  Full                                                       +---------+---------------+---------+-----------+----------+-------------+ FV Mid   Full                                                        +---------+---------------+---------+-----------+----------+-------------+ FV DistalFull                                                       +---------+---------------+---------+-----------+----------+-------------+ PFV      Full                                                       +---------+---------------+---------+-----------+----------+-------------+  POP      Full           Yes      Yes                  sluggish flow +---------+---------------+---------+-----------+----------+-------------+ PTV      Full                                                       +---------+---------------+---------+-----------+----------+-------------+ PERO     Full                                                       +---------+---------------+---------+-----------+----------+-------------+ Gastroc  Full                                                       +---------+---------------+---------+-----------+----------+-------------+  Left Venous Findings: +---------+---------------+---------+-----------+----------+------------------+          CompressibilityPhasicitySpontaneityPropertiesSummary            +---------+---------------+---------+-----------+----------+------------------+ CFV      Full           Yes      Yes                                     +---------+---------------+---------+-----------+----------+------------------+ SFJ      Full                                                            +---------+---------------+---------+-----------+----------+------------------+ FV Prox  Full                                                            +---------+---------------+---------+-----------+----------+------------------+ FV Mid   Full                                                            +---------+---------------+---------+-----------+----------+------------------+ FV DistalFull                                                             +---------+---------------+---------+-----------+----------+------------------+ PFV      Full                                                            +---------+---------------+---------+-----------+----------+------------------+  POP      Partial        Yes      Yes                  see comment below. +---------+---------------+---------+-----------+----------+------------------+ PTV      Full                                                            +---------+---------------+---------+-----------+----------+------------------+ PERO     Full                                                            +---------+---------------+---------+-----------+----------+------------------+ Gastroc  Full                                                            +---------+---------------+---------+-----------+----------+------------------+ Popliteal distal segment is difficult to compress, age indeterminate thrombosis    Summary: Right: There is no evidence of deep vein thrombosis in the lower extremity. However, portions of this examination were limited- see technologist comments above. No cystic structure found in the popliteal fossa. Left: Findings consistent with age indeterminate deep vein thrombosis involving the left popliteal vein. No cystic structure found in the popliteal fossa.  *See table(s) above for measurements and observations. Electronically signed by Deitra Mayo MD on 08/15/2018 at 5:11:18 PM.    Final    Vas [place] Lower Extremity Venous (dvt) (only [place])  Result Date: 08/02/2018  Lower Venous Study Indications: Swelling, and Edema.  Performing Technologist: Abram Sander RVS  Examination Guidelines: A complete evaluation includes B-mode imaging, spectral Doppler, color Doppler, and power Doppler as needed of all accessible portions of each vessel. Bilateral testing is considered an integral part of a complete examination. Limited examinations for  reoccurring indications may be performed as noted.  Right Venous Findings: +---------+---------------+---------+-----------+----------+-------+          CompressibilityPhasicitySpontaneityPropertiesSummary +---------+---------------+---------+-----------+----------+-------+ CFV      Full           Yes      Yes                          +---------+---------------+---------+-----------+----------+-------+ SFJ      Full                                                 +---------+---------------+---------+-----------+----------+-------+ FV Prox  Full                                                 +---------+---------------+---------+-----------+----------+-------+ FV Mid   Full                                                 +---------+---------------+---------+-----------+----------+-------+  FV DistalFull                                                 +---------+---------------+---------+-----------+----------+-------+ PFV      Full                                                 +---------+---------------+---------+-----------+----------+-------+ POP      Full           Yes      Yes                          +---------+---------------+---------+-----------+----------+-------+ PTV      Full                                                 +---------+---------------+---------+-----------+----------+-------+ PERO     Full                                                 +---------+---------------+---------+-----------+----------+-------+  Left Venous Findings: +---------+---------------+---------+-----------+----------+-------+          CompressibilityPhasicitySpontaneityPropertiesSummary +---------+---------------+---------+-----------+----------+-------+ CFV      Full           Yes      Yes                          +---------+---------------+---------+-----------+----------+-------+ SFJ      Full                                                  +---------+---------------+---------+-----------+----------+-------+ FV Prox  Full                                                 +---------+---------------+---------+-----------+----------+-------+ FV Mid   Full                                                 +---------+---------------+---------+-----------+----------+-------+ FV DistalFull                                                 +---------+---------------+---------+-----------+----------+-------+ PFV      Full                                                 +---------+---------------+---------+-----------+----------+-------+  POP      Full           Yes      Yes                          +---------+---------------+---------+-----------+----------+-------+ PTV      Full                                                 +---------+---------------+---------+-----------+----------+-------+ PERO     Full                                                 +---------+---------------+---------+-----------+----------+-------+    Summary: Right: There is no evidence of deep vein thrombosis in the lower extremity. No cystic structure found in the popliteal fossa. Left: There is no evidence of deep vein thrombosis in the lower extremity. No cystic structure found in the popliteal fossa.  *See table(s) above for measurements and observations. Electronically signed by Harold Barban MD on 08/02/2018 at 3:05:07 PM.    Final     (Echo, Carotid, EGD, Colonoscopy, ERCP)    Subjective:   Discharge Exam: Vitals:   08/18/18 0623 08/18/18 1413  BP: 118/77 130/74  Pulse: (!) 103 (!) 106  Resp: 16 16  Temp: 98.4 F (36.9 C) 100.2 F (37.9 C)  SpO2: 98% 97%   Vitals:   08/17/18 1949 08/18/18 0623 08/18/18 0704 08/18/18 1413  BP: 121/77 118/77  130/74  Pulse: (!) 109 (!) 103  (!) 106  Resp: 17 16  16   Temp: 98.3 F (36.8 C) 98.4 F (36.9 C)  100.2 F (37.9 C)  TempSrc: Oral Oral  Oral  SpO2: 100% 98%   97%  Weight:   98.3 kg   Height:        General: Pt is alert, awake, not in acute distress Cardiovascular: RRR, S1/S2 +, no rubs, no gallops Respiratory: CTA bilaterally, no wheezing, no rhonchi Abdominal: Soft, NT, ND, bowel sounds + Extremities: left lower extremity edema    The results of significant diagnostics from this hospitalization (including imaging, microbiology, ancillary and laboratory) are listed below for reference.     Microbiology: Recent Results (from the past 240 hour(s))  Culture, Urine     Status: None   Collection Time: 08/14/18  3:34 AM  Result Value Ref Range Status   Specimen Description   Final    URINE, CLEAN CATCH Performed at [facility], [place] [address], [place], [place] [address]    Special Requests   Final    NONE Performed at [facility], [place] [address] [place], [place] [address]    Culture   Final    NO GROWTH Performed at [facility], [place] [address] [place], [place] [address]    Report Status 08/15/2018 FINAL  Final     Labs: BNP (last 3 results) Recent Labs    08/01/18 2150 08/13/18 1845  BNP 330.4* 35.7   Basic Metabolic Panel: Recent Labs  Lab 08/13/18 1845 08/15/18 0348 08/16/18 0432 08/17/18 0357 08/18/18 0354  NA 138 137 139 140 138  K 3.8 3.4* 3.6 3.8 4.0  CL 101 97* 99 99 99  CO2 26 30 30  32 31  GLUCOSE 117* 107* 114* 144* 113*  BUN 13 14 14 16 20   CREATININE 1.08 1.14 0.96 1.13 1.16  CALCIUM 8.3* 8.2* 8.6* 8.5* 8.5*   Liver Function Tests: Recent Labs  Lab 08/13/18 1845  AST 26  ALT 31  ALKPHOS 115  BILITOT 0.7  PROT 6.3*  ALBUMIN 2.5*   No results for input(s): LIPASE, AMYLASE in the last 168 hours. No results for input(s): AMMONIA in the last 168 hours. CBC: Recent Labs  Lab 08/13/18 1845 08/15/18 0348 08/16/18 0432 08/17/18 0357 08/18/18 0354  WBC 9.4 15.2* 12.9* 11.4* 13.6*  NEUTROABS 6.2  --   --   --   --   HGB 6.3* 7.9* 7.7* 7.9* 7.7*   HCT 20.5* 25.1* 25.0* 25.5* 25.2*  MCV 103.5* 97.7 98.0 98.5 100.0  PLT 370 279 340 425* 452*   Cardiac Enzymes: Recent Labs  Lab 08/13/18 1845 08/14/18 1444  TROPONINI 0.03* 0.04*   BNP: Invalid input(s): POCBNP CBG: No results for input(s): GLUCAP in the last 168 hours. D-Dimer No results for input(s): DDIMER in the last 72 hours. Hgb A1c No results for input(s): HGBA1C in the last 72 hours. Lipid Profile No results for input(s): CHOL, HDL, LDLCALC, TRIG, CHOLHDL, LDLDIRECT in the last 72 hours. Thyroid function studies No results for input(s): TSH, T4TOTAL, T3FREE, THYROIDAB in the last 72 hours.  Invalid input(s): FREET3 Anemia work up No results for input(s): VITAMINB12, FOLATE, FERRITIN, TIBC, IRON, RETICCTPCT in the last 72 hours. Urinalysis    Component Value Date/Time   COLORURINE YELLOW 08/13/2018 2343   APPEARANCEUR HAZY (A) 08/13/2018 2343   LABSPEC 1.012 08/13/2018 2343   PHURINE 6.0 08/13/2018 2343   GLUCOSEU NEGATIVE 08/13/2018 2343   HGBUR LARGE (A) 08/13/2018 2343   BILIRUBINUR NEGATIVE 08/13/2018 2343   BILIRUBINUR neg 06/01/2014 1333   KETONESUR NEGATIVE 08/13/2018 2343   PROTEINUR 100 (A) 08/13/2018 2343   UROBILINOGEN 0.2 04/19/2015 0907   NITRITE NEGATIVE 08/13/2018 2343   LEUKOCYTESUR TRACE (A) 08/13/2018 2343   Sepsis Labs Invalid input(s): PROCALCITONIN,  WBC,  LACTICIDVEN Microbiology Recent Results (from the past 240 hour(s))  Culture, Urine     Status: None   Collection Time: 08/14/18  3:34 AM  Result Value Ref Range Status   Specimen Description   Final    URINE, CLEAN CATCH Performed at [facility], [place] [address] [place], [place] [address]    Special Requests   Final    NONE Performed at [facility], [place] [address] [place], [place] [address]    Culture   Final    NO GROWTH Performed at [facility], [place] [address] [place], [place] [address]    Report Status 08/15/2018 FINAL   Final     Time coordinating discharge: Over 30 minutes  SIGNED:   Beverely Pace, MD  [facility] 08/18/2018, 3:20 PM Pager on amion  If 7PM-7AM, please contact night-coverage [email or web address] Password [email or web address]

## 2018-08-18 NOTE — Progress Notes (Signed)
LCSW provided taxi voucher at dc.   Delila Kuklinski, LSCW [place] CSW [phone]  

## 2018-08-18 NOTE — Progress Notes (Signed)
Pharmacy Antibiotic Note  Dylan Gonzalez is a 52 y.o. male admitted on 08/13/2018 with UTI.  Pharmacy has been consulted for Cefepime dosing. PMH of stent placement 04/2018 and MSSA UTI 08/01/18 treated with vanc/cefepime x2d then transitioned to Amoxicillin for discharge.  12/13 SCr 1.16 WBC 13.6  Plan: Continue Cefepime 2g IV q12h Follow up renal fxn, culture results, and clinical course. F/u ability to de-escalate antibiotics.   Height: 5\' 11"  (180.3 cm) Weight: 216 lb 11.4 oz (98.3 kg) IBW/kg (Calculated) : 75.3  Temp (24hrs), Avg:98.7 F (37.1 C), Min:97.9 F (36.6 C), Max:100.2 F (37.9 C)  Recent Labs  Lab 08/13/18 1845 08/15/18 0348 08/16/18 0432 08/17/18 0357 08/18/18 0354  WBC 9.4 15.2* 12.9* 11.4* 13.6*  CREATININE 1.08 1.14 0.96 1.13 1.16    Estimated Creatinine Clearance: 89 mL/min (by C-G formula based on SCr of 1.16 mg/dL).    Allergies  Allergen Reactions  . Pollen Extract Other (See Comments)    Sneezing and shortness of breath    Antimicrobials this admission: 12/9 Ceftriaxone  >> 12/9 12/9 Cefepime >>   Dose adjustments this admission:   Microbiology results: Previous admission: 11/26 UCx: >100k MSSA (pan-sens) Current admission: 12/9 UCx: NGF  Thank you for allowing pharmacy to be a part of this patient's care.   Royetta Asal, PharmD, BCPS Pager [phone] 08/18/2018 2:31 PM

## 2018-08-23 DIAGNOSIS — R339 Retention of urine, unspecified: Secondary | ICD-10-CM | POA: Diagnosis not present

## 2018-08-23 DIAGNOSIS — C61 Malignant neoplasm of prostate: Secondary | ICD-10-CM | POA: Diagnosis not present

## 2018-08-23 DIAGNOSIS — C7800 Secondary malignant neoplasm of unspecified lung: Secondary | ICD-10-CM | POA: Diagnosis not present

## 2018-08-23 DIAGNOSIS — Z9181 History of falling: Secondary | ICD-10-CM | POA: Diagnosis not present

## 2018-08-24 DIAGNOSIS — F419 Anxiety disorder, unspecified: Secondary | ICD-10-CM | POA: Diagnosis not present

## 2018-08-24 DIAGNOSIS — R319 Hematuria, unspecified: Secondary | ICD-10-CM | POA: Diagnosis not present

## 2018-08-24 DIAGNOSIS — I10 Essential (primary) hypertension: Secondary | ICD-10-CM | POA: Diagnosis not present

## 2018-08-24 DIAGNOSIS — D649 Anemia, unspecified: Secondary | ICD-10-CM | POA: Diagnosis not present

## 2018-08-28 ENCOUNTER — Inpatient Hospital Stay (HOSPITAL_BASED_OUTPATIENT_CLINIC_OR_DEPARTMENT_OTHER): Payer: BLUE CROSS/BLUE SHIELD | Admitting: Oncology

## 2018-08-28 ENCOUNTER — Inpatient Hospital Stay: Payer: BLUE CROSS/BLUE SHIELD

## 2018-08-28 ENCOUNTER — Telehealth: Payer: Self-pay | Admitting: Oncology

## 2018-08-28 VITALS — BP 125/93 | HR 97

## 2018-08-28 VITALS — BP 142/98 | HR 106 | Temp 98.2°F | Resp 18 | Ht 71.0 in | Wt 212.3 lb

## 2018-08-28 DIAGNOSIS — D630 Anemia in neoplastic disease: Secondary | ICD-10-CM | POA: Diagnosis not present

## 2018-08-28 DIAGNOSIS — Z86718 Personal history of other venous thrombosis and embolism: Secondary | ICD-10-CM | POA: Diagnosis not present

## 2018-08-28 DIAGNOSIS — Z79899 Other long term (current) drug therapy: Secondary | ICD-10-CM | POA: Diagnosis not present

## 2018-08-28 DIAGNOSIS — C7800 Secondary malignant neoplasm of unspecified lung: Secondary | ICD-10-CM

## 2018-08-28 DIAGNOSIS — C494 Malignant neoplasm of connective and soft tissue of abdomen: Secondary | ICD-10-CM

## 2018-08-28 DIAGNOSIS — Z7901 Long term (current) use of anticoagulants: Secondary | ICD-10-CM | POA: Diagnosis not present

## 2018-08-28 DIAGNOSIS — Z5111 Encounter for antineoplastic chemotherapy: Secondary | ICD-10-CM | POA: Diagnosis not present

## 2018-08-28 DIAGNOSIS — Z23 Encounter for immunization: Secondary | ICD-10-CM | POA: Diagnosis not present

## 2018-08-28 DIAGNOSIS — Z95828 Presence of other vascular implants and grafts: Secondary | ICD-10-CM

## 2018-08-28 DIAGNOSIS — R600 Localized edema: Secondary | ICD-10-CM | POA: Diagnosis not present

## 2018-08-28 LAB — CMP (CANCER CENTER ONLY)
ALT: 17 U/L (ref 0–44)
AST: 17 U/L (ref 15–41)
Albumin: 2.4 g/dL — ABNORMAL LOW (ref 3.5–5.0)
Alkaline Phosphatase: 98 U/L (ref 38–126)
Anion gap: 10 (ref 5–15)
BUN: 7 mg/dL (ref 6–20)
CALCIUM: 9.1 mg/dL (ref 8.9–10.3)
CO2: 29 mmol/L (ref 22–32)
CREATININE: 0.93 mg/dL (ref 0.61–1.24)
Chloride: 104 mmol/L (ref 98–111)
GFR, Est AFR Am: >60 mL/min (ref >60)
GFR, Estimated: >60 mL/min (ref >60)
Glucose, Bld: 121 mg/dL — ABNORMAL HIGH (ref 70–99)
Potassium: 4 mmol/L (ref 3.5–5.1)
Sodium: 143 mmol/L (ref 135–145)
Total Bilirubin: 0.3 mg/dL (ref 0.3–1.2)
Total Protein: 7.1 g/dL (ref 6.5–8.1)

## 2018-08-28 LAB — CBC WITH DIFFERENTIAL (CANCER CENTER ONLY)
Abs Immature Granulocytes: 0.05 10*3/uL (ref 0.00–0.07)
Basophils Absolute: 0.1 10*3/uL (ref 0.0–0.1)
Basophils Relative: 1 %
Eosinophils Absolute: 0.2 10*3/uL (ref 0.0–0.5)
Eosinophils Relative: 1 %
HEMATOCRIT: 24.7 % — AB (ref 39.0–52.0)
Hemoglobin: 7.6 g/dL — ABNORMAL LOW (ref 13.0–17.0)
Immature Granulocytes: 0 %
Lymphocytes Relative: 10 %
Lymphs Abs: 1.3 10*3/uL (ref 0.7–4.0)
MCH: 28.4 pg (ref 26.0–34.0)
MCHC: 30.8 g/dL (ref 30.0–36.0)
MCV: 92.2 fL (ref 80.0–100.0)
MONO ABS: 1.5 10*3/uL — AB (ref 0.1–1.0)
Monocytes Relative: 12 %
Neutro Abs: 10 10*3/uL — ABNORMAL HIGH (ref 1.7–7.7)
Neutrophils Relative %: 76 %
Platelet Count: 665 10*3/uL — ABNORMAL HIGH (ref 150–400)
RBC: 2.68 MIL/uL — ABNORMAL LOW (ref 4.22–5.81)
RDW: 20.1 % — ABNORMAL HIGH (ref 11.5–15.5)
WBC Count: 13.1 10*3/uL — ABNORMAL HIGH (ref 4.0–10.5)
nRBC: 0 % (ref 0.0–0.2)

## 2018-08-28 MED ORDER — DEXAMETHASONE SODIUM PHOSPHATE 10 MG/ML IJ SOLN
INTRAMUSCULAR | Status: AC
  Filled 2018-08-28: qty 1

## 2018-08-28 MED ORDER — SODIUM CHLORIDE 0.9% FLUSH
10.0000 mL | INTRAVENOUS | Status: DC | PRN
  Administered 2018-08-28: 10 mL
  Filled 2018-08-28: qty 10

## 2018-08-28 MED ORDER — SODIUM CHLORIDE 0.9% FLUSH
3.0000 mL | INTRAVENOUS | Status: DC | PRN
  Filled 2018-08-28: qty 10

## 2018-08-28 MED ORDER — OXYCODONE HCL 5 MG PO TABS: 5.0000 mg | ORAL_TABLET | ORAL | 0 refills | Status: DC | PRN

## 2018-08-28 MED ORDER — HEPARIN SOD (PORK) LOCK FLUSH 100 UNIT/ML IV SOLN
500.0000 [IU] | Freq: Once | INTRAVENOUS | Status: AC | PRN
  Administered 2018-08-28: 500 [IU]
  Filled 2018-08-28: qty 5

## 2018-08-28 MED ORDER — SODIUM CHLORIDE 0.9 % IV SOLN
2000.0000 mg | Freq: Once | INTRAVENOUS | Status: AC
  Administered 2018-08-28: 2000 mg via INTRAVENOUS
  Filled 2018-08-28: qty 52.6

## 2018-08-28 MED ORDER — SODIUM CHLORIDE 0.9 % IV SOLN
Freq: Once | INTRAVENOUS | Status: AC
  Administered 2018-08-28: 12:00:00 via INTRAVENOUS
  Filled 2018-08-28: qty 250

## 2018-08-28 MED ORDER — DEXAMETHASONE SODIUM PHOSPHATE 10 MG/ML IJ SOLN
10.0000 mg | Freq: Once | INTRAMUSCULAR | Status: AC
  Administered 2018-08-28: 10 mg via INTRAVENOUS

## 2018-08-28 MED ORDER — SODIUM CHLORIDE 0.9% FLUSH
10.0000 mL | Freq: Once | INTRAVENOUS | Status: AC
  Administered 2018-08-28: 10 mL
  Filled 2018-08-28: qty 10

## 2018-08-28 MED ORDER — SODIUM CHLORIDE 0.9 % IV SOLN
100.0000 mg/m2 | Freq: Once | INTRAVENOUS | Status: AC
  Administered 2018-08-28: 220 mg via INTRAVENOUS
  Filled 2018-08-28: qty 22

## 2018-08-28 NOTE — Progress Notes (Signed)
Hematology and Oncology Follow Up Visit  Dylan Gonzalez [id number] 12/23/1965 52 y.o. 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  Prior Therapy: He is S/P transrectal prostate ultrasound and a prostate biopsy as well as a cystoscopy with insertion of double-J stent and transurethral resection of the prostate completed by Dr. Wrenn on April 13, 2018.  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  Immunohistochemical stains showed positive for desmin, smooth muscle actin, muscle specific actin and negative for PSA.  He was also negative for cytokeratin 7, 903, CD117 and CD34.     Current therapy: Taxotere with gemcitabine started on 05/30/2018.  He is here for cycle 5 of therapy.  Interim History: Mr. Cena returns is here for a follow-up.  Since the last visit, he was hospitalized for urinary tract infection and hematuria.  At that time, his anticoagulation was discontinued and an IVC filter was placed and since stopping his Xarelto, he has not reported any hematuria.  He has tolerated chemotherapy with few complications including slight fatigue, tiredness and anorexia.  His performance status although has not changed.  He is still ambulatory without any falls or syncope.  He denies any worsening neuropathy or anaphylaxis.  He continues to have issues with pelvic discomfort related to his tumor and manageable with oxycodone.  He does not report any headaches, blurry vision, syncope or seizures.  He denies any alteration in mental status or lethargy.  Does not report any fevers, chills or sweats.  Does not report any cough, wheezing or hemoptysis.  Does not report any chest pain, palpitation, orthopnea but does report chronic leg edema.   Does not report any nausea, vomiting or abdominal pain.  Denies any bleeding or clotting tendency.  Denies any arthralgias or myalgias.   Does not report frequency, urgency or hematuria.  Does not report any ecchymosis or petechiae.  Denies any mood changes.  Remaining review of systems is negative.    Medications: I have reviewed the patient's current medications.  Current Outpatient Medications  Medication Sig Dispense Refill  . albuterol (PROVENTIL HFA) 108 (90 Base) MCG/ACT inhaler Inhale 1-2 puffs into the lungs every 6 (six) hours as needed for wheezing or shortness of breath.    . ALPRAZolam (XANAX) 0.5 MG tablet Take 0.5 mg by mouth 2 (two) times daily as needed for anxiety.   0  . bisacodyl (DULCOLAX) 10 MG suppository Place 1 suppository (10 mg total) rectally daily as needed for moderate constipation. 12 suppository 0  . Docusate Calcium (STOOL SOFTENER PO) Take 1 tablet by mouth daily as needed (constipation).     . fluticasone (FLONASE) 50 MCG/ACT nasal spray PLACE 2 SPRAYS INTO BOTH NOSTRILS DAILY. (Patient taking differently: Place 2 sprays into both nostrils daily as needed for allergies. ) 16 g 9  . hydrOXYzine (VISTARIL) 100 MG capsule TAKE ONE CAPSULE BY MOUTH AT BEDTIME AS NEEDED FOR ALLERGY (Patient taking differently: Take 100 mg by mouth at bedtime as needed for itching. ) 30 capsule 5  . lidocaine-prilocaine (EMLA) cream Apply 1 application topically as needed. (Patient taking differently: Apply 1 application topically as needed (port access). ) 30 g 0  . metoprolol succinate (TOPROL-XL) 50 MG 24 hr tablet Take 50 mg by mouth daily.    1  . ondansetron (ZOFRAN) 8 MG tablet Take 1 tablet (8 mg total) by mouth every 8 (eight) hours as needed for nausea or vomiting. 20 tablet 0  . oxyCODONE (OXY IR/ROXICODONE) 5 MG immediate release tablet Take 1-2 tablets (5-10 mg total) by mouth every 4 (four) hours as needed for severe pain. 90 tablet 0  . polyethylene glycol (MIRALAX /  GLYCOLAX) packet Take 17 g by mouth 2 (two) times daily as needed for mild constipation or moderate constipation. 14 each 0   No current facility-administered medications for this visit.      Allergies:  Allergies  Allergen Reactions  . Pollen Extract Other (See Comments)    Sneezing and shortness of breath    Past Medical History, Surgical history, Social history, and Family History were reviewed and updated.    Physical Exam:  Blood pressure (!) 142/98, pulse (!) 106, temperature 98.2 F (36.8 C), temperature source Oral, resp. rate 18, height 5' 11" (1.803 m), weight 212 lb 4.8 oz (96.3 kg), SpO2 97 %.     ECOG: 1    General appearance: Comfortable appearing without any discomfort Head: Normocephalic without any trauma Oropharynx: Mucous membranes are moist and pink without any thrush or ulcers. Eyes: Pupils are equal and round reactive to light. Lymph nodes: No cervical, supraclavicular, inguinal or axillary lymphadenopathy.   Heart:regular rate and rhythm.  S1 and S2 without leg edema. Lung: Clear without any rhonchi or wheezes.  No dullness to percussion. Abdomin: Soft, nontender, nondistended with good bowel sounds.  No hepatosplenomegaly. Musculoskeletal: No joint deformity or effusion.  Full range of motion noted. Neurological: No deficits noted on motor, sensory and deep tendon reflex exam. Skin: No petechial rash or dryness.  Appeared moist.  Psychiatric: Mood and affect appeared appropriate.         Lab Results: Lab Results  Component Value Date   WBC 13.1 (H) 08/28/2018   HGB 7.6 (L) 08/28/2018   HCT 24.7 (L) 08/28/2018   MCV 92.2 08/28/2018   PLT 665 (H) 08/28/2018     Chemistry      Component Value Date/Time   NA 138 08/18/2018 0354   K 4.0 08/18/2018 0354   CL 99 08/18/2018 0354   CO2 31 08/18/2018 0354   BUN 20 08/18/2018 0354   CREATININE 1.16 08/18/2018 0354   CREATININE 1.29 (H) 08/07/2018 0921   CREATININE 1.07 06/01/2014 1257       Component Value Date/Time   CALCIUM 8.5 (L) 08/18/2018 0354   ALKPHOS 115 08/13/2018 1845   AST 26 08/13/2018 1845   AST 43 (H) 08/07/2018 0921   ALT 31 08/13/2018 1845   ALT 45 (H) 08/07/2018 0921   BILITOT 0.7 08/13/2018 1845   BILITOT 0.5 08/07/2018 0921      EXAM: CT ABDOMEN AND PELVIS WITHOUT AND WITH CONTRAST  TECHNIQUE: Multidetector CT imaging of the abdomen and pelvis was performed following the standard protocol before and following the bolus administration of intravenous contrast.  CONTRAST:  129m OMNIPAQUE IOHEXOL 300 MG/ML  SOLN  COMPARISON:  Renal ultrasound of 07/26/2018. Most recent CT of 05/22/2018.  FINDINGS: Lower chest: Index left lower lobe pulmonary nodule measures 6 mm on image 15/9 and is decreased from 8 mm on the prior. Normal heart size without pericardial effusion. Trace left pleural fluid is new. Incompletely imaged central line.  Hepatobiliary: Right hepatic lobe cyst and too small to characterize lesion, also likely a cyst. Normal gallbladder, without biliary ductal dilatation.  Pancreas: Normal,  without mass or ductal dilatation.  Spleen: Normal in size, without focal abnormality.  Adrenals/Urinary Tract: Normal adrenal glands. No renal calculi. Bilateral ureteric stents remain in place. No ureteric or bladder stones.  No hydronephrosis. This stents terminate in the urinary bladder, which is anteriorly displaced and compressed by the below described tumor. The renal collecting systems are well opacified on delayed images, without filling defect. The ureters are not well opacified. No dominant enhancing bladder mass. The bladder is not well opacified on delayed images, decompressed by a Foley catheter.  Stomach/Bowel: Normal stomach, without wall thickening. Colonic stool burden suggests constipation. Normal terminal ileum and appendix. Normal small bowel.  Vascular/Lymphatic: Normal caliber of the aorta and  branch vessels. No abdominal adenopathy. A right external iliac node measures 10 mm on image 82/5 and is similar to minimally decreased from 11 mm on the prior.  Reproductive: Central pelvic mass is heterogeneous and necrotic, including at 9.5 x 11.6 cm on image 75/5. Compare 9.4 x 12.6 cm on the prior exam (when remeasured). New trace gas within, including on image 74/13. This is intimately associated with the posterior wall of the urinary bladder.  Other: No significant free fluid. No free intraperitoneal air. Mild periumbilical ventral abdominal wall laxity.  Musculoskeletal: Probable bone island in the left femoral head.  IMPRESSION: 1. Similar size of a central pelvic mass, with new gas within, likely related to necrosis. This is intimately associated with the posterior wall of the urinary bladder, which is otherwise collapsed and suboptimally evaluated. Cannot exclude direct tumor involvement. 2. Otherwise, no explanation for hematuria. Suboptimal ureteric opacification, with ureteric stents in place. 3. Decrease in size of index left lower lobe pulmonary nodule, suggesting response to therapy of pulmonary metastasis. 4. Similar borderline right external iliac adenopathy, indeterminate. 5. Trace left pleural fluid.   Impression and Plan:  52 year old man with:  1.    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     He is currently on salvage chemotherapy utilizing gemcitabine and Taxotere with reasonable tolerance.  CT scan obtained on 08/14/2018 was personally reviewed today and reviewed with the patient and his wife.  The scan shows a reasonable response to therapy and overall has tolerated it without any major complications.  Risks and benefits of continuing this therapy was discussed today and is agreeable to continue.  We will complete cycle 5 and cycle 6 of therapy before repeating imaging studies.  2.  IV access:  Port-A-Cath continues to be in place without any issues and in use without any complaints.  3.  Left common femoral vein DVT: Xarelto has been discontinued related to hematuria.  IVC filter is placed.  4.  Urinary obstruction: Related to large pelvic tumor and Foley catheter remains in place.  5.  Pelvic pain: Manageable with oxycodone at this time which will be refilled for him today.  6.  Lower extremity edema: Improved since the last visit.  7.  Anemia: Related to his malignancy and bleeding.  No need for transfusion at this time.  8.  Follow-up: In 1 week for day 8 of cycle 5 of therapy in 3 weeks to start cycle 6.  25 minutes was spent with the patient face-to-face today.  More than 50% of time was dedicated to reviewing his disease status, laboratory data, imaging studies and answering question regarding plan of care.     [name], MD 12/23/201910:11 AM

## 2018-08-28 NOTE — Telephone Encounter (Signed)
Printed calendar and avs. °

## 2018-08-28 NOTE — Patient Instructions (Signed)
[place] Discharge Instructions for Patients Receiving Chemotherapy  Today you received the following chemotherapy agents :  Gemcitabine,  Taxotere.  To help prevent nausea and vomiting after your treatment, we encourage you to take your nausea medication as prescribed.   If you develop nausea and vomiting that is not controlled by your nausea medication, call the clinic.   BELOW ARE SYMPTOMS THAT SHOULD BE REPORTED IMMEDIATELY:  *FEVER GREATER THAN 100.5 F  *CHILLS WITH OR WITHOUT FEVER  NAUSEA AND VOMITING THAT IS NOT CONTROLLED WITH YOUR NAUSEA MEDICATION  *UNUSUAL SHORTNESS OF BREATH  *UNUSUAL BRUISING OR BLEEDING  TENDERNESS IN MOUTH AND THROAT WITH OR WITHOUT PRESENCE OF ULCERS  *URINARY PROBLEMS  *BOWEL PROBLEMS  UNUSUAL RASH Items with * indicate a potential emergency and should be followed up as soon as possible.  Feel free to call the clinic should you have any questions or concerns. The clinic phone number is [phone].  Please show the [place] at check-in to the Emergency Department and triage nurse.

## 2018-08-31 ENCOUNTER — Telehealth: Payer: Self-pay | Admitting: *Deleted

## 2018-08-31 NOTE — Telephone Encounter (Signed)
Received TC from patient. He states he had chemotherapy on Monday, 08/28/18 with Gemzar and Taxotere.  He states he feels fatigued today, had diarrhea x1 episode 2 days ago-no problems since then, reports decreased appetite, minimal nausea. No vomiting.  States he feels like he has a fever, but had not taken his temp. Instructed pt to take his temperature. He did and it was 97.9. He is drinking fluids well. Instructed pt to continue pushing oral fluids (caffeinated drinks do not count), eat small frequent meals, understanding that his taste is off at this time.  Instructed pt to call back with any worsening of symptoms, fever chills etc.  Instructed to to go ED if temp goes to > that 100.5 Pt voiced understanding.

## 2018-09-04 ENCOUNTER — Inpatient Hospital Stay: Payer: BLUE CROSS/BLUE SHIELD

## 2018-09-04 DIAGNOSIS — Z95828 Presence of other vascular implants and grafts: Secondary | ICD-10-CM

## 2018-09-04 DIAGNOSIS — Z5111 Encounter for antineoplastic chemotherapy: Secondary | ICD-10-CM | POA: Diagnosis not present

## 2018-09-04 DIAGNOSIS — C494 Malignant neoplasm of connective and soft tissue of abdomen: Secondary | ICD-10-CM

## 2018-09-04 DIAGNOSIS — D630 Anemia in neoplastic disease: Secondary | ICD-10-CM | POA: Diagnosis not present

## 2018-09-04 DIAGNOSIS — C7800 Secondary malignant neoplasm of unspecified lung: Secondary | ICD-10-CM | POA: Diagnosis not present

## 2018-09-04 DIAGNOSIS — Z86718 Personal history of other venous thrombosis and embolism: Secondary | ICD-10-CM | POA: Diagnosis not present

## 2018-09-04 DIAGNOSIS — R600 Localized edema: Secondary | ICD-10-CM | POA: Diagnosis not present

## 2018-09-04 DIAGNOSIS — Z23 Encounter for immunization: Secondary | ICD-10-CM | POA: Diagnosis not present

## 2018-09-04 DIAGNOSIS — Z79899 Other long term (current) drug therapy: Secondary | ICD-10-CM | POA: Diagnosis not present

## 2018-09-04 DIAGNOSIS — Z7901 Long term (current) use of anticoagulants: Secondary | ICD-10-CM | POA: Diagnosis not present

## 2018-09-04 LAB — CMP (CANCER CENTER ONLY)
ALT: 16 U/L (ref 0–44)
AST: 18 U/L (ref 15–41)
Albumin: 2.4 g/dL — ABNORMAL LOW (ref 3.5–5.0)
Alkaline Phosphatase: 84 U/L (ref 38–126)
Anion gap: 9 (ref 5–15)
BUN: 8 mg/dL (ref 6–20)
CHLORIDE: 104 mmol/L (ref 98–111)
CO2: 29 mmol/L (ref 22–32)
Calcium: 8.9 mg/dL (ref 8.9–10.3)
Creatinine: 0.92 mg/dL (ref 0.61–1.24)
GFR, Est AFR Am: >60 mL/min (ref >60)
GFR, Estimated: >60 mL/min (ref >60)
Glucose, Bld: 112 mg/dL — ABNORMAL HIGH (ref 70–99)
Potassium: 4 mmol/L (ref 3.5–5.1)
Sodium: 142 mmol/L (ref 135–145)
Total Bilirubin: 0.3 mg/dL (ref 0.3–1.2)
Total Protein: 6.9 g/dL (ref 6.5–8.1)

## 2018-09-04 LAB — CBC WITH DIFFERENTIAL (CANCER CENTER ONLY)
Abs Immature Granulocytes: 0.04 10*3/uL (ref 0.00–0.07)
Basophils Absolute: 0 10*3/uL (ref 0.0–0.1)
Basophils Relative: 1 %
Eosinophils Absolute: 0.1 10*3/uL (ref 0.0–0.5)
Eosinophils Relative: 2 %
HCT: 24.5 % — ABNORMAL LOW (ref 39.0–52.0)
Hemoglobin: 7.4 g/dL — ABNORMAL LOW (ref 13.0–17.0)
Immature Granulocytes: 1 %
LYMPHS PCT: 34 %
Lymphs Abs: 1.1 10*3/uL (ref 0.7–4.0)
MCH: 27.6 pg (ref 26.0–34.0)
MCHC: 30.2 g/dL (ref 30.0–36.0)
MCV: 91.4 fL (ref 80.0–100.0)
Monocytes Absolute: 0.3 10*3/uL (ref 0.1–1.0)
Monocytes Relative: 9 %
Neutro Abs: 1.7 10*3/uL (ref 1.7–7.7)
Neutrophils Relative %: 53 %
Platelet Count: 279 10*3/uL (ref 150–400)
RBC: 2.68 MIL/uL — ABNORMAL LOW (ref 4.22–5.81)
RDW: 21.2 % — ABNORMAL HIGH (ref 11.5–15.5)
WBC Count: 3.3 10*3/uL — ABNORMAL LOW (ref 4.0–10.5)
nRBC: 1.5 % — ABNORMAL HIGH (ref 0.0–0.2)

## 2018-09-04 MED ORDER — HEPARIN SOD (PORK) LOCK FLUSH 100 UNIT/ML IV SOLN
500.0000 [IU] | Freq: Once | INTRAVENOUS | Status: AC
  Administered 2018-09-04: 500 [IU]
  Filled 2018-09-04: qty 5

## 2018-09-04 MED ORDER — SODIUM CHLORIDE 0.9% FLUSH
10.0000 mL | Freq: Once | INTRAVENOUS | Status: AC
  Administered 2018-09-04: 10 mL
  Filled 2018-09-04: qty 10

## 2018-09-05 ENCOUNTER — Other Ambulatory Visit: Payer: Self-pay | Admitting: *Deleted

## 2018-09-05 ENCOUNTER — Inpatient Hospital Stay: Payer: BLUE CROSS/BLUE SHIELD

## 2018-09-05 DIAGNOSIS — C7800 Secondary malignant neoplasm of unspecified lung: Secondary | ICD-10-CM

## 2018-09-05 DIAGNOSIS — C494 Malignant neoplasm of connective and soft tissue of abdomen: Secondary | ICD-10-CM | POA: Diagnosis not present

## 2018-09-05 DIAGNOSIS — D630 Anemia in neoplastic disease: Secondary | ICD-10-CM | POA: Diagnosis not present

## 2018-09-05 DIAGNOSIS — Z5111 Encounter for antineoplastic chemotherapy: Secondary | ICD-10-CM | POA: Diagnosis not present

## 2018-09-05 DIAGNOSIS — Z86718 Personal history of other venous thrombosis and embolism: Secondary | ICD-10-CM | POA: Diagnosis not present

## 2018-09-05 DIAGNOSIS — Z79899 Other long term (current) drug therapy: Secondary | ICD-10-CM | POA: Diagnosis not present

## 2018-09-05 DIAGNOSIS — Z7901 Long term (current) use of anticoagulants: Secondary | ICD-10-CM | POA: Diagnosis not present

## 2018-09-05 DIAGNOSIS — R600 Localized edema: Secondary | ICD-10-CM | POA: Diagnosis not present

## 2018-09-05 DIAGNOSIS — Z23 Encounter for immunization: Secondary | ICD-10-CM | POA: Diagnosis not present

## 2018-09-05 LAB — PREPARE RBC (CROSSMATCH)

## 2018-09-06 ENCOUNTER — Inpatient Hospital Stay: Payer: Medicaid Other | Attending: Oncology

## 2018-09-06 DIAGNOSIS — Z5189 Encounter for other specified aftercare: Secondary | ICD-10-CM | POA: Insufficient documentation

## 2018-09-06 DIAGNOSIS — Z86718 Personal history of other venous thrombosis and embolism: Secondary | ICD-10-CM | POA: Diagnosis not present

## 2018-09-06 DIAGNOSIS — Z79899 Other long term (current) drug therapy: Secondary | ICD-10-CM | POA: Insufficient documentation

## 2018-09-06 DIAGNOSIS — Z7901 Long term (current) use of anticoagulants: Secondary | ICD-10-CM | POA: Diagnosis not present

## 2018-09-06 DIAGNOSIS — C7800 Secondary malignant neoplasm of unspecified lung: Secondary | ICD-10-CM | POA: Insufficient documentation

## 2018-09-06 DIAGNOSIS — R102 Pelvic and perineal pain: Secondary | ICD-10-CM | POA: Insufficient documentation

## 2018-09-06 DIAGNOSIS — D6481 Anemia due to antineoplastic chemotherapy: Secondary | ICD-10-CM | POA: Diagnosis not present

## 2018-09-06 DIAGNOSIS — I82412 Acute embolism and thrombosis of left femoral vein: Secondary | ICD-10-CM | POA: Insufficient documentation

## 2018-09-06 DIAGNOSIS — R600 Localized edema: Secondary | ICD-10-CM | POA: Diagnosis not present

## 2018-09-06 DIAGNOSIS — C61 Malignant neoplasm of prostate: Secondary | ICD-10-CM | POA: Insufficient documentation

## 2018-09-06 MED ORDER — DIPHENHYDRAMINE HCL 25 MG PO CAPS
25.0000 mg | ORAL_CAPSULE | Freq: Once | ORAL | Status: AC
  Administered 2018-09-06: 25 mg via ORAL

## 2018-09-06 MED ORDER — HEPARIN SOD (PORK) LOCK FLUSH 100 UNIT/ML IV SOLN
500.0000 [IU] | Freq: Every day | INTRAVENOUS | Status: AC | PRN
  Administered 2018-09-06: 500 [IU]
  Filled 2018-09-06: qty 5

## 2018-09-06 MED ORDER — ACETAMINOPHEN 325 MG PO TABS
ORAL_TABLET | ORAL | Status: AC
  Filled 2018-09-06: qty 2

## 2018-09-06 MED ORDER — ACETAMINOPHEN 325 MG PO TABS
650.0000 mg | ORAL_TABLET | Freq: Once | ORAL | Status: AC
  Administered 2018-09-06: 650 mg via ORAL

## 2018-09-06 MED ORDER — DIPHENHYDRAMINE HCL 25 MG PO CAPS
ORAL_CAPSULE | ORAL | Status: AC
  Filled 2018-09-06: qty 1

## 2018-09-06 MED ORDER — SODIUM CHLORIDE 0.9% FLUSH
10.0000 mL | INTRAVENOUS | Status: AC | PRN
  Administered 2018-09-06: 10 mL
  Filled 2018-09-06: qty 10

## 2018-09-06 MED ORDER — SODIUM CHLORIDE 0.9% IV SOLUTION
250.0000 mL | Freq: Once | INTRAVENOUS | Status: AC
  Administered 2018-09-06: 250 mL via INTRAVENOUS
  Filled 2018-09-06: qty 250

## 2018-09-06 NOTE — Patient Instructions (Signed)
Blood Transfusion, Adult, Care After This sheet gives you information about how to care for yourself after your procedure. Your doctor may also give you more specific instructions. If you have problems or questions, contact your doctor. Follow these instructions at home:   Take over-the-counter and prescription medicines only as told by your doctor.  Go back to your normal activities as told by your doctor.  Follow instructions from your doctor about how to take care of the area where an IV tube was put into your vein (insertion site). Make sure you: ? Wash your hands with soap and water before you change your bandage (dressing). If there is no soap and water, use hand sanitizer. ? Change your bandage as told by your doctor.  Check your IV insertion site every day for signs of infection. Check for: ? More redness, swelling, or pain. ? More fluid or blood. ? Warmth. ? Pus or a bad smell. Contact a doctor if:  You have more redness, swelling, or pain around the IV insertion site.  You have more fluid or blood coming from the IV insertion site.  Your IV insertion site feels warm to the touch.  You have pus or a bad smell coming from the IV insertion site.  Your pee (urine) turns pink, red, or brown.  You feel weak after doing your normal activities. Get help right away if:  You have signs of a serious allergic or body defense (immune) system reaction, including: ? Itchiness. ? Hives. ? Trouble breathing. ? Anxiety. ? Pain in your chest or lower back. ? Fever, flushing, and chills. ? Fast pulse. ? Rash. ? Watery poop (diarrhea). ? Throwing up (vomiting). ? Dark pee. ? Serious headache. ? Dizziness. ? Stiff neck. ? Yellow color in your face or the white parts of your eyes (jaundice). Summary  After a blood transfusion, return to your normal activities as told by your doctor.  Every day, check for signs of infection where the IV tube was put into your vein.  Some  signs of infection are warm skin, more redness and pain, more fluid or blood, and pus or a bad smell where the needle went in.  Contact your doctor if you feel weak or have any unusual symptoms. This information is not intended to replace advice given to you by your health care provider. Make sure you discuss any questions you have with your health care provider. Document Released: 09/13/2014 Document Revised: 04/16/2016 Document Reviewed: 04/16/2016 Elsevier Interactive Patient Education  2019 [employer]  

## 2018-09-07 LAB — BPAM RBC
Blood Product Expiration Date: 202001252359
Blood Product Expiration Date: 202001252359
ISSUE DATE / TIME: 202001010757
ISSUE DATE / TIME: 202001010757
UNIT TYPE AND RH: 5100
Unit Type and Rh: 5100

## 2018-09-07 LAB — TYPE AND SCREEN
ABO/RH(D): O POS
Antibody Screen: NEGATIVE
Unit division: 0
Unit division: 0

## 2018-09-18 ENCOUNTER — Inpatient Hospital Stay (HOSPITAL_BASED_OUTPATIENT_CLINIC_OR_DEPARTMENT_OTHER): Payer: Medicaid Other | Admitting: Oncology

## 2018-09-18 ENCOUNTER — Inpatient Hospital Stay: Payer: Medicaid Other

## 2018-09-18 VITALS — BP 150/99 | HR 96 | Temp 98.1°F | Resp 18 | Ht 71.0 in | Wt 208.4 lb

## 2018-09-18 DIAGNOSIS — Z7901 Long term (current) use of anticoagulants: Secondary | ICD-10-CM | POA: Diagnosis not present

## 2018-09-18 DIAGNOSIS — R102 Pelvic and perineal pain: Secondary | ICD-10-CM | POA: Diagnosis not present

## 2018-09-18 DIAGNOSIS — R600 Localized edema: Secondary | ICD-10-CM | POA: Diagnosis not present

## 2018-09-18 DIAGNOSIS — Z5189 Encounter for other specified aftercare: Secondary | ICD-10-CM | POA: Diagnosis not present

## 2018-09-18 DIAGNOSIS — C494 Malignant neoplasm of connective and soft tissue of abdomen: Secondary | ICD-10-CM

## 2018-09-18 DIAGNOSIS — C7800 Secondary malignant neoplasm of unspecified lung: Secondary | ICD-10-CM

## 2018-09-18 DIAGNOSIS — Z95828 Presence of other vascular implants and grafts: Secondary | ICD-10-CM

## 2018-09-18 DIAGNOSIS — I82412 Acute embolism and thrombosis of left femoral vein: Secondary | ICD-10-CM | POA: Diagnosis not present

## 2018-09-18 DIAGNOSIS — Z86718 Personal history of other venous thrombosis and embolism: Secondary | ICD-10-CM | POA: Diagnosis not present

## 2018-09-18 DIAGNOSIS — C61 Malignant neoplasm of prostate: Secondary | ICD-10-CM | POA: Diagnosis not present

## 2018-09-18 DIAGNOSIS — D6481 Anemia due to antineoplastic chemotherapy: Secondary | ICD-10-CM | POA: Diagnosis not present

## 2018-09-18 DIAGNOSIS — Z79899 Other long term (current) drug therapy: Secondary | ICD-10-CM | POA: Diagnosis not present

## 2018-09-18 LAB — CBC WITH DIFFERENTIAL (CANCER CENTER ONLY)
Abs Immature Granulocytes: 0.04 10*3/uL (ref 0.00–0.07)
Basophils Absolute: 0.1 10*3/uL (ref 0.0–0.1)
Basophils Relative: 1 %
Eosinophils Absolute: 0.2 10*3/uL (ref 0.0–0.5)
Eosinophils Relative: 2 %
HCT: 32.8 % — ABNORMAL LOW (ref 39.0–52.0)
Hemoglobin: 9.8 g/dL — ABNORMAL LOW (ref 13.0–17.0)
Immature Granulocytes: 0 %
LYMPHS PCT: 11 %
Lymphs Abs: 1.1 10*3/uL (ref 0.7–4.0)
MCH: 26.5 pg (ref 26.0–34.0)
MCHC: 29.9 g/dL — ABNORMAL LOW (ref 30.0–36.0)
MCV: 88.6 fL (ref 80.0–100.0)
Monocytes Absolute: 1.1 10*3/uL — ABNORMAL HIGH (ref 0.1–1.0)
Monocytes Relative: 11 %
Neutro Abs: 8.1 10*3/uL — ABNORMAL HIGH (ref 1.7–7.7)
Neutrophils Relative %: 75 %
Platelet Count: 452 10*3/uL — ABNORMAL HIGH (ref 150–400)
RBC: 3.7 MIL/uL — ABNORMAL LOW (ref 4.22–5.81)
RDW: 19.9 % — ABNORMAL HIGH (ref 11.5–15.5)
WBC Count: 10.7 10*3/uL — ABNORMAL HIGH (ref 4.0–10.5)
nRBC: 0 % (ref 0.0–0.2)

## 2018-09-18 LAB — CMP (CANCER CENTER ONLY)
ALT: 10 U/L (ref 0–44)
AST: 14 U/L — AB (ref 15–41)
Albumin: 2.7 g/dL — ABNORMAL LOW (ref 3.5–5.0)
Alkaline Phosphatase: 77 U/L (ref 38–126)
Anion gap: 8 (ref 5–15)
BUN: 9 mg/dL (ref 6–20)
CHLORIDE: 105 mmol/L (ref 98–111)
CO2: 30 mmol/L (ref 22–32)
CREATININE: 0.93 mg/dL (ref 0.61–1.24)
Calcium: 8.9 mg/dL (ref 8.9–10.3)
GFR, Est AFR Am: >60 mL/min (ref >60)
GFR, Estimated: >60 mL/min (ref >60)
Glucose, Bld: 113 mg/dL — ABNORMAL HIGH (ref 70–99)
POTASSIUM: 3.8 mmol/L (ref 3.5–5.1)
Sodium: 143 mmol/L (ref 135–145)
Total Bilirubin: 0.3 mg/dL (ref 0.3–1.2)
Total Protein: 7.4 g/dL (ref 6.5–8.1)

## 2018-09-18 MED ORDER — OXYCODONE HCL 5 MG PO TABS: 5.0000 mg | ORAL_TABLET | ORAL | 0 refills | Status: DC | PRN

## 2018-09-18 MED ORDER — PROCHLORPERAZINE MALEATE 10 MG PO TABS
10.0000 mg | ORAL_TABLET | Freq: Once | ORAL | Status: AC
  Administered 2018-09-18: 10 mg via ORAL

## 2018-09-18 MED ORDER — SODIUM CHLORIDE 0.9% FLUSH
10.0000 mL | Freq: Once | INTRAVENOUS | Status: AC
  Administered 2018-09-18: 10 mL
  Filled 2018-09-18: qty 10

## 2018-09-18 MED ORDER — HEPARIN SOD (PORK) LOCK FLUSH 100 UNIT/ML IV SOLN
500.0000 [IU] | Freq: Once | INTRAVENOUS | Status: AC | PRN
  Administered 2018-09-18: 500 [IU]
  Filled 2018-09-18: qty 5

## 2018-09-18 MED ORDER — SODIUM CHLORIDE 0.9% FLUSH
10.0000 mL | INTRAVENOUS | Status: DC | PRN
  Administered 2018-09-18: 10 mL
  Filled 2018-09-18: qty 10

## 2018-09-18 MED ORDER — SODIUM CHLORIDE 0.9 % IV SOLN
2000.0000 mg | Freq: Once | INTRAVENOUS | Status: AC
  Administered 2018-09-18: 2000 mg via INTRAVENOUS
  Filled 2018-09-18: qty 52.6

## 2018-09-18 MED ORDER — SODIUM CHLORIDE 0.9 % IV SOLN
Freq: Once | INTRAVENOUS | Status: AC
  Administered 2018-09-18: 10:00:00 via INTRAVENOUS
  Filled 2018-09-18: qty 250

## 2018-09-18 MED ORDER — PROCHLORPERAZINE MALEATE 10 MG PO TABS
ORAL_TABLET | ORAL | Status: AC
  Filled 2018-09-18: qty 1

## 2018-09-18 NOTE — Patient Instructions (Signed)
[facility] °Discharge Instructions for Patients Receiving Chemotherapy ° °Today you received the following chemotherapy agents Gemzar ° °To help prevent nausea and vomiting after your treatment, we encourage you to take your nausea medication as directed. °  °If you develop nausea and vomiting that is not controlled by your nausea medication, call the clinic.  ° °BELOW ARE SYMPTOMS THAT SHOULD BE REPORTED IMMEDIATELY: °· *FEVER GREATER THAN 100.5 F °· *CHILLS WITH OR WITHOUT FEVER °· NAUSEA AND VOMITING THAT IS NOT CONTROLLED WITH YOUR NAUSEA MEDICATION °· *UNUSUAL SHORTNESS OF BREATH °· *UNUSUAL BRUISING OR BLEEDING °· TENDERNESS IN MOUTH AND THROAT WITH OR WITHOUT PRESENCE OF ULCERS °· *URINARY PROBLEMS °· *BOWEL PROBLEMS °· UNUSUAL RASH °Items with * indicate a potential emergency and should be followed up as soon as possible. ° °Feel free to call the clinic should you have any questions or concerns. The clinic phone number is [phone]. ° °Please show the CHEMO ALERT CARD at check-in to the Emergency Department and triage nurse. ° ° °

## 2018-09-18 NOTE — Progress Notes (Signed)
Hematology and Oncology Follow Up Visit  Dylan Gonzalez [id number] [birth year] 53 y.o. 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   Prior Therapy: He is S/P transrectal prostate ultrasound and a prostate biopsy as well as a cystoscopy with insertion of double-J stent and transurethral resection of the prostate completed by Dr. Jeffie Gonzalez on April 13, 2018.  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  Immunohistochemical stains showed positive for desmin, smooth muscle actin, muscle specific actin and negative for PSA.  He was also negative for cytokeratin 7, 903, CD117 and CD34.     Current therapy: Taxotere with gemcitabine started on 05/30/2018.  He is status post 4 cycles of therapy.  Interim History: Dylan Gonzalez is here for repeat evaluation.  Since last visit, he has tolerated the last cycle of chemotherapy without any major complications.  He does report some mild lower extremity edema and slight neuropathy but symptoms have been manageable at this time.  He denies any nausea or infusion related complications.  His appetite and performance status are improving.  He received 2 units of packed red cell transfusion which have helped with his mobility.  He does report pelvic discomfort that is manageable with oxycodone at this time.  He denies any hematuria or dysuria.  He does not report any headaches, blurry vision, syncope or seizures.  He denies any alteration in mental status or lethargy.  Does not report any fevers, chills or sweats.  Does not report any cough, wheezing or hemoptysis.  Does not report any chest pain, palpitation, with improvement in lower extremity edema. Does not report any nausea, vomiting or  distention.  Denies any ecchymosis or petechiae.  Denies any pathological fractures or joint deformity.   Does not report frequency, urgency or dysuria.  Does not report any anxiety or depression.  Remaining review of systems is negative.    Medications: I have reviewed the patient's current medications.  Current Outpatient Medications  Medication Sig Dispense Refill  . albuterol (PROVENTIL HFA) 108 (90 Base) MCG/ACT inhaler Inhale 1-2 puffs into the lungs every 6 (six) hours as needed for wheezing or shortness of breath.    . ALPRAZolam (XANAX) 0.5 MG tablet Take 0.5 mg by mouth 2 (two) times daily as needed for anxiety.   0  . bisacodyl (DULCOLAX) 10 MG suppository Place 1 suppository (10 mg total) rectally daily as needed for moderate constipation. 12 suppository 0  . Docusate Calcium (STOOL SOFTENER PO) Take 1 tablet by mouth daily as needed (constipation).     . fluticasone (FLONASE) 50 MCG/ACT nasal spray PLACE 2 SPRAYS INTO BOTH NOSTRILS DAILY. (Patient taking differently: Place 2 sprays into both nostrils daily as needed for allergies. ) 16 g 9  . hydrOXYzine (VISTARIL) 100 MG capsule TAKE ONE CAPSULE BY MOUTH AT BEDTIME AS NEEDED FOR ALLERGY (Patient taking differently: Take 100 mg by mouth at bedtime as needed for itching. ) 30 capsule 5  . lidocaine-prilocaine (EMLA) cream Apply 1 application topically as needed. (Patient taking differently: Apply 1 application topically as needed (port access). ) 30 g 0  . metoprolol succinate (TOPROL-XL) 50 MG 24 hr tablet Take 50 mg by mouth daily.  1  . ondansetron (ZOFRAN) 8 MG tablet Take 1 tablet (8 mg  total) by mouth every 8 (eight) hours as needed for nausea or vomiting. 20 tablet 0  . oxyCODONE (OXY IR/ROXICODONE) 5 MG immediate release tablet Take 1-2 tablets (5-10 mg total) by mouth every 4 (four) hours as needed for severe pain. 90 tablet 0  . polyethylene glycol (MIRALAX / GLYCOLAX) packet Take 17 g by mouth 2 (two) times daily as needed for  mild constipation or moderate constipation. 14 each 0   No current facility-administered medications for this visit.      Allergies:  Allergies  Allergen Reactions  . Pollen Extract Other (See Comments)    Sneezing and shortness of breath    Past Medical History, Surgical history, Social history, and Family History were reviewed and updated.    Physical Exam:  Blood pressure (!) 150/99, pulse 96, temperature 98.1 F (36.7 C), temperature source Oral, resp. rate 18, height _0  (1.803 m), weight 208 lb 6.4 oz (94.5 kg), SpO2 99 %.     ECOG: 1    General appearance: Alert, awake without any distress. Head: Atraumatic without abnormalities Oropharynx: Without any thrush or ulcers. Eyes: No scleral icterus. Lymph nodes: No lymphadenopathy noted in the cervical, supraclavicular, or axillary nodes Heart:regular rate and rhythm, without any murmurs or gallops.    1+ ankle edema noted. Lung: Clear to auscultation without any rhonchi, wheezes or dullness to percussion. Abdomin: Soft, nontender without any shifting dullness or ascites. Musculoskeletal: No clubbing or cyanosis. Neurological: No motor or sensory deficits. Skin: No rashes or lesions.          Lab Results: Lab Results  Component Value Date   WBC 10.7 (H) 09/18/2018   HGB 9.8 (L) 09/18/2018   HCT 32.8 (L) 09/18/2018   MCV 88.6 09/18/2018   PLT 452 (H) 09/18/2018     Chemistry      Component Value Date/Time   NA 142 09/04/2018 1135   K 4.0 09/04/2018 1135   CL 104 09/04/2018 1135   CO2 29 09/04/2018 1135   BUN 8 09/04/2018 1135   CREATININE 0.92 09/04/2018 1135   CREATININE 1.07 06/01/2014 1257      Component Value Date/Time   CALCIUM 8.9 09/04/2018 1135   ALKPHOS 84 09/04/2018 1135   AST 18 09/04/2018 1135   ALT 16 09/04/2018 1135   BILITOT 0.3 09/04/2018 1135      Impression and Plan:  53 year old man with:  1.    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  He was  found to have high-grade leiomyosarcoma likely with pulmonary involvement.   He continues to tolerate systemic chemotherapy utilizing gemcitabine with Taxotere.  Risks and benefits of continuing therapy was discussed today and the plan is to finish the last 2 cycles and proceed with staging work-up.  Depending on his response to therapy salvage surgery may be considered.  He is agreeable to proceed with this plan.  2.  IV access: Port-A-Cath remains in use without any issues.  3.  Left common femoral vein DVT: IVC filter in place and anticoagulation was discontinued primarily because of continuous hematuria.  4.  Urinary obstruction: Foley catheter remains in place.  5.  Pelvic pain: Oxycodone was refilled for him to use as needed.  Pain control is adequate at this time.  6.  Lower extremity edema: Continues to improve.  His edema is related to urinary obstruction as well as Taxotere chemotherapy.  7.  Anemia: Secondary to chemotherapy as well as bleeding.  His hemoglobin improved after transfusion.  8.  Follow-up: In 1 week for day 8 of cycle 5 and in 3 weeks for cycle 6 of therapy.  25 minutes was spent with the patient face-to-face today.  More than 50% of time was dedicated to discussing his disease status, reviewing laboratory data, imaging studies and answering question regarding future plan of care.     [name], MD 1/13/20209:38 AM

## 2018-09-25 ENCOUNTER — Other Ambulatory Visit: Payer: Self-pay | Admitting: Urology

## 2018-09-25 ENCOUNTER — Inpatient Hospital Stay: Payer: Medicaid Other

## 2018-09-25 ENCOUNTER — Inpatient Hospital Stay (HOSPITAL_BASED_OUTPATIENT_CLINIC_OR_DEPARTMENT_OTHER): Payer: Medicaid Other | Admitting: Medical

## 2018-09-25 ENCOUNTER — Other Ambulatory Visit: Payer: Self-pay | Admitting: Medical

## 2018-09-25 VITALS — BP 141/93 | HR 91 | Temp 98.0°F | Resp 18

## 2018-09-25 DIAGNOSIS — C494 Malignant neoplasm of connective and soft tissue of abdomen: Secondary | ICD-10-CM

## 2018-09-25 DIAGNOSIS — R319 Hematuria, unspecified: Principal | ICD-10-CM

## 2018-09-25 DIAGNOSIS — C61 Malignant neoplasm of prostate: Secondary | ICD-10-CM | POA: Diagnosis not present

## 2018-09-25 DIAGNOSIS — N390 Urinary tract infection, site not specified: Secondary | ICD-10-CM

## 2018-09-25 DIAGNOSIS — D6481 Anemia due to antineoplastic chemotherapy: Secondary | ICD-10-CM | POA: Diagnosis not present

## 2018-09-25 DIAGNOSIS — Z86718 Personal history of other venous thrombosis and embolism: Secondary | ICD-10-CM | POA: Diagnosis not present

## 2018-09-25 DIAGNOSIS — R102 Pelvic and perineal pain: Secondary | ICD-10-CM | POA: Diagnosis not present

## 2018-09-25 DIAGNOSIS — R600 Localized edema: Secondary | ICD-10-CM | POA: Diagnosis not present

## 2018-09-25 DIAGNOSIS — Z79899 Other long term (current) drug therapy: Secondary | ICD-10-CM | POA: Diagnosis not present

## 2018-09-25 DIAGNOSIS — C7800 Secondary malignant neoplasm of unspecified lung: Secondary | ICD-10-CM

## 2018-09-25 DIAGNOSIS — Z7901 Long term (current) use of anticoagulants: Secondary | ICD-10-CM | POA: Diagnosis not present

## 2018-09-25 DIAGNOSIS — Z5189 Encounter for other specified aftercare: Secondary | ICD-10-CM | POA: Diagnosis not present

## 2018-09-25 DIAGNOSIS — I82412 Acute embolism and thrombosis of left femoral vein: Secondary | ICD-10-CM | POA: Diagnosis not present

## 2018-09-25 DIAGNOSIS — Z95828 Presence of other vascular implants and grafts: Secondary | ICD-10-CM

## 2018-09-25 LAB — CBC WITH DIFFERENTIAL (CANCER CENTER ONLY)
Abs Immature Granulocytes: 0.01 10*3/uL (ref 0.00–0.07)
Basophils Absolute: 0.1 10*3/uL (ref 0.0–0.1)
Basophils Relative: 1 %
EOS ABS: 0.1 10*3/uL (ref 0.0–0.5)
Eosinophils Relative: 2 %
HCT: 31.4 % — ABNORMAL LOW (ref 39.0–52.0)
Hemoglobin: 9.5 g/dL — ABNORMAL LOW (ref 13.0–17.0)
Immature Granulocytes: 0 %
Lymphocytes Relative: 25 %
Lymphs Abs: 1.3 10*3/uL (ref 0.7–4.0)
MCH: 26.2 pg (ref 26.0–34.0)
MCHC: 30.3 g/dL (ref 30.0–36.0)
MCV: 86.7 fL (ref 80.0–100.0)
Monocytes Absolute: 0.6 10*3/uL (ref 0.1–1.0)
Monocytes Relative: 11 %
Neutro Abs: 3.3 10*3/uL (ref 1.7–7.7)
Neutrophils Relative %: 61 %
Platelet Count: 215 10*3/uL (ref 150–400)
RBC: 3.62 MIL/uL — ABNORMAL LOW (ref 4.22–5.81)
RDW: 19.4 % — ABNORMAL HIGH (ref 11.5–15.5)
WBC Count: 5.4 10*3/uL (ref 4.0–10.5)
nRBC: 0 % (ref 0.0–0.2)

## 2018-09-25 LAB — CMP (CANCER CENTER ONLY)
ALT: 15 U/L (ref 0–44)
AST: 17 U/L (ref 15–41)
Albumin: 2.7 g/dL — ABNORMAL LOW (ref 3.5–5.0)
Alkaline Phosphatase: 69 U/L (ref 38–126)
Anion gap: 8 (ref 5–15)
BILIRUBIN TOTAL: 0.3 mg/dL (ref 0.3–1.2)
BUN: 11 mg/dL (ref 6–20)
CO2: 30 mmol/L (ref 22–32)
Calcium: 9 mg/dL (ref 8.9–10.3)
Chloride: 106 mmol/L (ref 98–111)
Creatinine: 0.9 mg/dL (ref 0.61–1.24)
GFR, Est AFR Am: >60 mL/min (ref >60)
GFR, Estimated: >60 mL/min (ref >60)
Glucose, Bld: 115 mg/dL — ABNORMAL HIGH (ref 70–99)
Potassium: 3.8 mmol/L (ref 3.5–5.1)
Sodium: 144 mmol/L (ref 135–145)
TOTAL PROTEIN: 7.4 g/dL (ref 6.5–8.1)

## 2018-09-25 MED ORDER — SODIUM CHLORIDE 0.9% FLUSH
10.0000 mL | Freq: Once | INTRAVENOUS | Status: AC
  Administered 2018-09-25: 10 mL
  Filled 2018-09-25: qty 10

## 2018-09-25 MED ORDER — SODIUM CHLORIDE 0.9 % IV SOLN
2000.0000 mg | Freq: Once | INTRAVENOUS | Status: AC
  Administered 2018-09-25: 2000 mg via INTRAVENOUS
  Filled 2018-09-25: qty 52.6

## 2018-09-25 MED ORDER — DEXAMETHASONE SODIUM PHOSPHATE 10 MG/ML IJ SOLN
10.0000 mg | Freq: Once | INTRAMUSCULAR | Status: AC
  Administered 2018-09-25: 10 mg via INTRAVENOUS

## 2018-09-25 MED ORDER — CIPROFLOXACIN HCL 500 MG PO TABS: 500.0000 mg | ORAL_TABLET | Freq: Two times a day (BID) | ORAL | 0 refills | Status: DC

## 2018-09-25 MED ORDER — SODIUM CHLORIDE 0.9 % IV SOLN
Freq: Once | INTRAVENOUS | Status: AC
  Administered 2018-09-25: 12:00:00 via INTRAVENOUS
  Filled 2018-09-25: qty 250

## 2018-09-25 MED ORDER — SODIUM CHLORIDE 0.9 % IV SOLN: 2000.0000 mg | Freq: Once | INTRAVENOUS | Status: DC

## 2018-09-25 MED ORDER — DEXAMETHASONE SODIUM PHOSPHATE 10 MG/ML IJ SOLN
INTRAMUSCULAR | Status: AC
  Filled 2018-09-25: qty 1

## 2018-09-25 MED ORDER — SODIUM CHLORIDE 0.9% FLUSH
10.0000 mL | INTRAVENOUS | Status: DC | PRN
  Administered 2018-09-25: 10 mL
  Filled 2018-09-25: qty 10

## 2018-09-25 MED ORDER — SODIUM CHLORIDE 0.9 % IV SOLN
100.0000 mg/m2 | Freq: Once | INTRAVENOUS | Status: AC
  Administered 2018-09-25: 220 mg via INTRAVENOUS
  Filled 2018-09-25: qty 22

## 2018-09-25 MED ORDER — HEPARIN SOD (PORK) LOCK FLUSH 100 UNIT/ML IV SOLN
500.0000 [IU] | Freq: Once | INTRAVENOUS | Status: AC | PRN
  Administered 2018-09-25: 500 [IU]
  Filled 2018-09-25: qty 5

## 2018-09-25 NOTE — Progress Notes (Signed)
Dylan Gonzalez was seen in the infusion room today.  He reports that he has noted episodic hematuria into his catheter bag.  He reports he had previously had this happen when he was on a blood thinner.  He is currently not using any blood thinner.  He denies fevers, chills, or sweats.  We were unable to get a sample for the lab.  Because of this I elected to send a prescription for Cipro 500 mg p.o. twice daily to his pharmacy.  Sandi Mealy, MHS, PA-C Physician Assistant

## 2018-09-25 NOTE — Patient Instructions (Addendum)
[facility] Discharge Instructions for Patients Receiving Chemotherapy  Today you received the following chemotherapy agents :  Gemcitabine,  Taxotere.  To help prevent nausea and vomiting after your treatment, we encourage you to take your nausea medication as prescribed.   If you develop nausea and vomiting that is not controlled by your nausea medication, call the clinic.   BELOW ARE SYMPTOMS THAT SHOULD BE REPORTED IMMEDIATELY:  *FEVER GREATER THAN 100.5 F  *CHILLS WITH OR WITHOUT FEVER  NAUSEA AND VOMITING THAT IS NOT CONTROLLED WITH YOUR NAUSEA MEDICATION  *UNUSUAL SHORTNESS OF BREATH  *UNUSUAL BRUISING OR BLEEDING  TENDERNESS IN MOUTH AND THROAT WITH OR WITHOUT PRESENCE OF ULCERS  *URINARY PROBLEMS  *BOWEL PROBLEMS  UNUSUAL RASH Items with * indicate a potential emergency and should be followed up as soon as possible.  Feel free to call the clinic should you have any questions or concerns. The clinic phone number is [phone].  Please show the [facility] at check-in to the Emergency Department and triage nurse.  Docetaxel injection (taxotere) What is this medicine? DOCETAXEL (doe se TAX el) is a chemotherapy drug. It targets fast dividing cells, like cancer cells, and causes these cells to die. This medicine is used to treat many types of cancers like breast cancer, certain stomach cancers, head and neck cancer, lung cancer, and prostate cancer. This medicine may be used for other purposes; ask your health care provider or pharmacist if you have questions. COMMON BRAND NAME(S): Docefrez, Taxotere What should I tell my health care provider before I take this medicine? They need to know if you have any of these conditions: -infection (especially a virus infection such as chickenpox, cold sores, or herpes) -liver disease -low blood counts, like low white cell, platelet, or red cell counts -an unusual or allergic reaction to docetaxel,  polysorbate 80, other chemotherapy agents, other medicines, foods, dyes, or preservatives -pregnant or trying to get pregnant -breast-feeding How should I use this medicine? This drug is given as an infusion into a vein. It is administered in a hospital or clinic by a specially trained health care professional. Talk to your pediatrician regarding the use of this medicine in children. Special care may be needed. Overdosage: If you think you have taken too much of this medicine contact a poison control center or emergency room at once. NOTE: This medicine is only for you. Do not share this medicine with others. What if I miss a dose? It is important not to miss your dose. Call your doctor or health care professional if you are unable to keep an appointment. What may interact with this medicine? -cyclosporine -erythromycin -ketoconazole -medicines to increase blood counts like filgrastim, pegfilgrastim, sargramostim -vaccines Talk to your doctor or health care professional before taking any of these medicines: -acetaminophen -aspirin -ibuprofen -ketoprofen -naproxen This list may not describe all possible interactions. Give your health care provider a list of all the medicines, herbs, non-prescription drugs, or dietary supplements you use. Also tell them if you smoke, drink alcohol, or use illegal drugs. Some items may interact with your medicine. What should I watch for while using this medicine? Your condition will be monitored carefully while you are receiving this medicine. You will need important blood work done while you are taking this medicine. This drug may make you feel generally unwell. This is not uncommon, as chemotherapy can affect healthy cells as well as cancer cells. Report any side effects. Continue your course of treatment even  though you feel ill unless your doctor tells you to stop. In some cases, you may be given additional medicines to help with side effects. Follow all  directions for their use. Call your doctor or health care professional for advice if you get a fever, chills or sore throat, or other symptoms of a cold or flu. Do not treat yourself. This drug decreases your body's ability to fight infections. Try to avoid being around people who are sick. This medicine may increase your risk to bruise or bleed. Call your doctor or health care professional if you notice any unusual bleeding. This medicine may contain alcohol in the product. You may get drowsy or dizzy. Do not drive, use machinery, or do anything that needs mental alertness until you know how this medicine affects you. Do not stand or sit up quickly, especially if you are an older patient. This reduces the risk of dizzy or fainting spells. Avoid alcoholic drinks. Do not become pregnant while taking this medicine or for 6 months after stopping it. Women should inform their doctor if they wish to become pregnant or think they might be pregnant. Men should not father a child while taking this medicine and for 3 months after stopping it. There is a potential for serious side effects to an unborn child. Talk to your health care professional or pharmacist for more information. Do not breast-feed an infant while taking this medicine or for 2 weeks after stopping it. This may interfere with the ability to father a child. You should talk to your doctor or health care professional if you are concerned about your fertility. What side effects may I notice from receiving this medicine? Side effects that you should report to your doctor or health care professional as soon as possible: -allergic reactions like skin rash, itching or hives, swelling of the face, lips, or tongue -low blood counts - This drug may decrease the number of white blood cells, red blood cells and platelets. You may be at increased risk for infections and bleeding. -signs of infection - fever or chills, cough, sore throat, pain or difficulty  passing urine -signs of decreased platelets or bleeding - bruising, pinpoint red spots on the skin, black, tarry stools, nosebleeds -signs of decreased red blood cells - unusually weak or tired, fainting spells, lightheadedness -breathing problems -fast or irregular heartbeat -low blood pressure -mouth sores -nausea and vomiting -pain, swelling, redness or irritation at the injection site -pain, tingling, numbness in the hands or feet -swelling of the ankle, feet, hands -weight gain Side effects that usually do not require medical attention (report to your doctor or health care professional if they continue or are bothersome): -bone pain -complete hair loss including hair on your head, underarms, pubic hair, eyebrows, and eyelashes -diarrhea -excessive tearing -changes in the color of fingernails -loosening of the fingernails -nausea -muscle pain -red flush to skin -sweating -weak or tired This list may not describe all possible side effects. Call your doctor for medical advice about side effects. You may report side effects to FDA at 1-800-FDA-1088. Where should I keep my medicine? This drug is given in a hospital or clinic and will not be stored at home. NOTE: This sheet is a summary. It may not cover all possible information. If you have questions about this medicine, talk to your doctor, pharmacist, or health care provider.  2019 Elsevier/Gold Standard (2017-09-19 12:07:21)  Gemcitabine injection (Gemzar) What is this medicine? GEMCITABINE (jem SYE ta been) is a chemotherapy drug.  This medicine is used to treat many types of cancer like breast cancer, lung cancer, pancreatic cancer, and ovarian cancer. This medicine may be used for other purposes; ask your health care provider or pharmacist if you have questions. COMMON BRAND NAME(S): Gemzar, Infugem What should I tell my health care provider before I take this medicine? They need to know if you have any of these  conditions: -blood disorders -infection -kidney disease -liver disease -lung or breathing disease, like asthma -recent or ongoing radiation therapy -an unusual or allergic reaction to gemcitabine, other chemotherapy, other medicines, foods, dyes, or preservatives -pregnant or trying to get pregnant -breast-feeding How should I use this medicine? This drug is given as an infusion into a vein. It is administered in a hospital or clinic by a specially trained health care professional. Talk to your pediatrician regarding the use of this medicine in children. Special care may be needed. Overdosage: If you think you have taken too much of this medicine contact a poison control center or emergency room at once. NOTE: This medicine is only for you. Do not share this medicine with others. What if I miss a dose? It is important not to miss your dose. Call your doctor or health care professional if you are unable to keep an appointment. What may interact with this medicine? -medicines to increase blood counts like filgrastim, pegfilgrastim, sargramostim -some other chemotherapy drugs like cisplatin -vaccines Talk to your doctor or health care professional before taking any of these medicines: -acetaminophen -aspirin -ibuprofen -ketoprofen -naproxen This list may not describe all possible interactions. Give your health care provider a list of all the medicines, herbs, non-prescription drugs, or dietary supplements you use. Also tell them if you smoke, drink alcohol, or use illegal drugs. Some items may interact with your medicine. What should I watch for while using this medicine? Visit your doctor for checks on your progress. This drug may make you feel generally unwell. This is not uncommon, as chemotherapy can affect healthy cells as well as cancer cells. Report any side effects. Continue your course of treatment even though you feel ill unless your doctor tells you to stop. In some cases, you  may be given additional medicines to help with side effects. Follow all directions for their use. Call your doctor or health care professional for advice if you get a fever, chills or sore throat, or other symptoms of a cold or flu. Do not treat yourself. This drug decreases your body's ability to fight infections. Try to avoid being around people who are sick. This medicine may increase your risk to bruise or bleed. Call your doctor or health care professional if you notice any unusual bleeding. Be careful brushing and flossing your teeth or using a toothpick because you may get an infection or bleed more easily. If you have any dental work done, tell your dentist you are receiving this medicine. Avoid taking products that contain aspirin, acetaminophen, ibuprofen, naproxen, or ketoprofen unless instructed by your doctor. These medicines may hide a fever. Do not become pregnant while taking this medicine or for 6 months after stopping it. Women should inform their doctor if they wish to become pregnant or think they might be pregnant. Men should not father a child while taking this medicine and for 3 months after stopping it. There is a potential for serious side effects to an unborn child. Talk to your health care professional or pharmacist for more information. Do not breast-feed an infant while taking this  medicine or for at least 1 week after stopping it. Men should inform their doctors if they wish to father a child. This medicine may lower sperm counts. Talk with your doctor or health care professional if you are concerned about your fertility. What side effects may I notice from receiving this medicine? Side effects that you should report to your doctor or health care professional as soon as possible: -allergic reactions like skin rash, itching or hives, swelling of the face, lips, or tongue -breathing problems -pain, redness, or irritation at site where injected -signs and symptoms of a  dangerous change in heartbeat or heart rhythm like chest pain; dizziness; fast or irregular heartbeat; palpitations; feeling faint or lightheaded, falls; breathing problems -signs of decreased platelets or bleeding - bruising, pinpoint red spots on the skin, black, tarry stools, blood in the urine -signs of decreased red blood cells - unusually weak or tired, feeling faint or lightheaded, falls -signs of infection - fever or chills, cough, sore throat, pain or difficulty passing urine -signs and symptoms of kidney injury like trouble passing urine or change in the amount of urine -signs and symptoms of liver injury like dark yellow or brown urine; general ill feeling or flu-like symptoms; light-colored stools; loss of appetite; nausea; right upper belly pain; unusually weak or tired; yellowing of the eyes or skin -swelling of ankles, feet, hands Side effects that usually do not require medical attention (report to your doctor or health care professional if they continue or are bothersome): -constipation -diarrhea -hair loss -loss of appetite -nausea -rash -vomiting This list may not describe all possible side effects. Call your doctor for medical advice about side effects. You may report side effects to FDA at 1-800-FDA-1088. Where should I keep my medicine? This drug is given in a hospital or clinic and will not be stored at home. NOTE: This sheet is a summary. It may not cover all possible information. If you have questions about this medicine, talk to your doctor, pharmacist, or health care provider.  2019 Elsevier/Gold Standard (2017-11-16 18:06:11)

## 2018-09-26 ENCOUNTER — Telehealth: Payer: Self-pay

## 2018-09-26 ENCOUNTER — Inpatient Hospital Stay: Payer: Medicaid Other

## 2018-09-26 VITALS — BP 148/88 | HR 98 | Temp 98.0°F | Resp 16

## 2018-09-26 DIAGNOSIS — C494 Malignant neoplasm of connective and soft tissue of abdomen: Secondary | ICD-10-CM

## 2018-09-26 DIAGNOSIS — D6481 Anemia due to antineoplastic chemotherapy: Secondary | ICD-10-CM | POA: Diagnosis not present

## 2018-09-26 DIAGNOSIS — Z86718 Personal history of other venous thrombosis and embolism: Secondary | ICD-10-CM | POA: Diagnosis not present

## 2018-09-26 DIAGNOSIS — Z79899 Other long term (current) drug therapy: Secondary | ICD-10-CM | POA: Diagnosis not present

## 2018-09-26 DIAGNOSIS — Z5189 Encounter for other specified aftercare: Secondary | ICD-10-CM | POA: Diagnosis not present

## 2018-09-26 DIAGNOSIS — R102 Pelvic and perineal pain: Secondary | ICD-10-CM | POA: Diagnosis not present

## 2018-09-26 DIAGNOSIS — R600 Localized edema: Secondary | ICD-10-CM | POA: Diagnosis not present

## 2018-09-26 DIAGNOSIS — Z7901 Long term (current) use of anticoagulants: Secondary | ICD-10-CM | POA: Diagnosis not present

## 2018-09-26 DIAGNOSIS — C7800 Secondary malignant neoplasm of unspecified lung: Secondary | ICD-10-CM | POA: Diagnosis not present

## 2018-09-26 DIAGNOSIS — C61 Malignant neoplasm of prostate: Secondary | ICD-10-CM | POA: Diagnosis not present

## 2018-09-26 DIAGNOSIS — I82412 Acute embolism and thrombosis of left femoral vein: Secondary | ICD-10-CM | POA: Diagnosis not present

## 2018-09-26 MED ORDER — PEGFILGRASTIM-CBQV 6 MG/0.6ML ~~LOC~~ SOSY
6.0000 mg | PREFILLED_SYRINGE | Freq: Once | SUBCUTANEOUS | Status: AC
  Administered 2018-09-26: 6 mg via SUBCUTANEOUS

## 2018-09-26 MED ORDER — PEGFILGRASTIM-CBQV 6 MG/0.6ML ~~LOC~~ SOSY
PREFILLED_SYRINGE | SUBCUTANEOUS | Status: AC
  Filled 2018-09-26: qty 0.6

## 2018-09-26 NOTE — Patient Instructions (Signed)
Pegfilgrastim injection  What is this medicine?  PEGFILGRASTIM (PEG fil gra stim) is a long-acting granulocyte colony-stimulating factor that stimulates the growth of neutrophils, a type of white blood cell important in the body's fight against infection. It is used to reduce the incidence of fever and infection in patients with certain types of cancer who are receiving chemotherapy that affects the bone marrow, and to increase survival after being exposed to high doses of radiation.  This medicine may be used for other purposes; ask your health care provider or pharmacist if you have questions.  COMMON BRAND NAME(S): Fulphila, Neulasta, UDENYCA  What should I tell my health care provider before I take this medicine?  They need to know if you have any of these conditions:  -kidney disease  -latex allergy  -ongoing radiation therapy  -sickle cell disease  -skin reactions to acrylic adhesives (On-Body Injector only)  -an unusual or allergic reaction to pegfilgrastim, filgrastim, other medicines, foods, dyes, or preservatives  -pregnant or trying to get pregnant  -breast-feeding  How should I use this medicine?  This medicine is for injection under the skin. If you get this medicine at home, you will be taught how to prepare and give the pre-filled syringe or how to use the On-body Injector. Refer to the patient Instructions for Use for detailed instructions. Use exactly as directed. Tell your healthcare provider immediately if you suspect that the On-body Injector may not have performed as intended or if you suspect the use of the On-body Injector resulted in a missed or partial dose.  It is important that you put your used needles and syringes in a special sharps container. Do not put them in a trash can. If you do not have a sharps container, call your pharmacist or healthcare provider to get one.  Talk to your pediatrician regarding the use of this medicine in children. While this drug may be prescribed for  selected conditions, precautions do apply.  Overdosage: If you think you have taken too much of this medicine contact a poison control center or emergency room at once.  NOTE: This medicine is only for you. Do not share this medicine with others.  What if I miss a dose?  It is important not to miss your dose. Call your doctor or health care professional if you miss your dose. If you miss a dose due to an On-body Injector failure or leakage, a new dose should be administered as soon as possible using a single prefilled syringe for manual use.  What may interact with this medicine?  Interactions have not been studied.  Give your health care provider a list of all the medicines, herbs, non-prescription drugs, or dietary supplements you use. Also tell them if you smoke, drink alcohol, or use illegal drugs. Some items may interact with your medicine.  This list may not describe all possible interactions. Give your health care provider a list of all the medicines, herbs, non-prescription drugs, or dietary supplements you use. Also tell them if you smoke, drink alcohol, or use illegal drugs. Some items may interact with your medicine.  What should I watch for while using this medicine?  You may need blood work done while you are taking this medicine.  If you are going to need a MRI, CT scan, or other procedure, tell your doctor that you are using this medicine (On-Body Injector only).  What side effects may I notice from receiving this medicine?  Side effects that you should report to   your doctor or health care professional as soon as possible:  -allergic reactions like skin rash, itching or hives, swelling of the face, lips, or tongue  -back pain  -dizziness  -fever  -pain, redness, or irritation at site where injected  -pinpoint red spots on the skin  -red or dark-brown urine  -shortness of breath or breathing problems  -stomach or side pain, or pain at the shoulder  -swelling  -tiredness  -trouble passing urine or  change in the amount of urine  Side effects that usually do not require medical attention (report to your doctor or health care professional if they continue or are bothersome):  -bone pain  -muscle pain  This list may not describe all possible side effects. Call your doctor for medical advice about side effects. You may report side effects to FDA at 1-800-FDA-1088.  Where should I keep my medicine?  Keep out of the reach of children.  If you are using this medicine at home, you will be instructed on how to store it. Throw away any unused medicine after the expiration date on the label.  NOTE: This sheet is a summary. It may not cover all possible information. If you have questions about this medicine, talk to your doctor, pharmacist, or health care provider.   2019 Elsevier/Gold Standard (2017-11-28 16:57:08)

## 2018-09-26 NOTE — Telephone Encounter (Signed)
Received call from the patient stating that he needs his injection appointment changed since it is to be received 24 hours after chemo. Confirmed with the patient that he needs the [facility] transportation services. Appointment changed and Ginette Otto with transportation services made aware and she had already contacted the patient, made aware of the appt change and has already set up transportation.

## 2018-09-28 ENCOUNTER — Inpatient Hospital Stay (HOSPITAL_COMMUNITY)
Admission: EM | Admit: 2018-09-28 | Discharge: 2018-09-30 | DRG: 698 | Disposition: A | Discharge status: Home / Self Care | Payer: BLUE CROSS/BLUE SHIELD | Attending: Student | Admitting: Student

## 2018-09-28 ENCOUNTER — Emergency Department (HOSPITAL_COMMUNITY): Payer: Self-pay

## 2018-09-28 ENCOUNTER — Other Ambulatory Visit: Payer: Self-pay

## 2018-09-28 ENCOUNTER — Encounter (HOSPITAL_COMMUNITY): Payer: Self-pay | Admitting: Emergency Medicine

## 2018-09-28 DIAGNOSIS — R1030 Lower abdominal pain, unspecified: Secondary | ICD-10-CM | POA: Diagnosis not present

## 2018-09-28 DIAGNOSIS — E876 Hypokalemia: Secondary | ICD-10-CM | POA: Diagnosis present

## 2018-09-28 DIAGNOSIS — D649 Anemia, unspecified: Secondary | ICD-10-CM | POA: Diagnosis present

## 2018-09-28 DIAGNOSIS — T83098A Other mechanical complication of other indwelling urethral catheter, initial encounter: Secondary | ICD-10-CM | POA: Diagnosis not present

## 2018-09-28 DIAGNOSIS — D72825 Bandemia: Secondary | ICD-10-CM

## 2018-09-28 DIAGNOSIS — Z79899 Other long term (current) drug therapy: Secondary | ICD-10-CM | POA: Diagnosis not present

## 2018-09-28 DIAGNOSIS — R339 Retention of urine, unspecified: Secondary | ICD-10-CM

## 2018-09-28 DIAGNOSIS — D709 Neutropenia, unspecified: Secondary | ICD-10-CM | POA: Diagnosis present

## 2018-09-28 DIAGNOSIS — I10 Essential (primary) hypertension: Secondary | ICD-10-CM | POA: Diagnosis not present

## 2018-09-28 DIAGNOSIS — D630 Anemia in neoplastic disease: Secondary | ICD-10-CM | POA: Diagnosis not present

## 2018-09-28 DIAGNOSIS — I252 Old myocardial infarction: Secondary | ICD-10-CM | POA: Diagnosis not present

## 2018-09-28 DIAGNOSIS — Z79891 Long term (current) use of opiate analgesic: Secondary | ICD-10-CM

## 2018-09-28 DIAGNOSIS — I2510 Atherosclerotic heart disease of native coronary artery without angina pectoris: Secondary | ICD-10-CM | POA: Diagnosis present

## 2018-09-28 DIAGNOSIS — Z8249 Family history of ischemic heart disease and other diseases of the circulatory system: Secondary | ICD-10-CM | POA: Diagnosis not present

## 2018-09-28 DIAGNOSIS — R1031 Right lower quadrant pain: Secondary | ICD-10-CM | POA: Diagnosis not present

## 2018-09-28 DIAGNOSIS — R269 Unspecified abnormalities of gait and mobility: Secondary | ICD-10-CM | POA: Diagnosis present

## 2018-09-28 DIAGNOSIS — R609 Edema, unspecified: Secondary | ICD-10-CM | POA: Diagnosis not present

## 2018-09-28 DIAGNOSIS — R079 Chest pain, unspecified: Secondary | ICD-10-CM | POA: Diagnosis not present

## 2018-09-28 DIAGNOSIS — Z95828 Presence of other vascular implants and grafts: Secondary | ICD-10-CM

## 2018-09-28 DIAGNOSIS — I5032 Chronic diastolic (congestive) heart failure: Secondary | ICD-10-CM | POA: Diagnosis not present

## 2018-09-28 DIAGNOSIS — R32 Unspecified urinary incontinence: Secondary | ICD-10-CM | POA: Diagnosis present

## 2018-09-28 DIAGNOSIS — Y846 Urinary catheterization as the cause of abnormal reaction of the patient, or of later complication, without mention of misadventure at the time of the procedure: Secondary | ICD-10-CM | POA: Diagnosis present

## 2018-09-28 DIAGNOSIS — C7800 Secondary malignant neoplasm of unspecified lung: Secondary | ICD-10-CM | POA: Diagnosis present

## 2018-09-28 DIAGNOSIS — Z85840 Personal history of malignant neoplasm of eye: Secondary | ICD-10-CM

## 2018-09-28 DIAGNOSIS — R000 Tachycardia, unspecified: Secondary | ICD-10-CM | POA: Diagnosis present

## 2018-09-28 DIAGNOSIS — R109 Unspecified abdominal pain: Secondary | ICD-10-CM | POA: Diagnosis present

## 2018-09-28 DIAGNOSIS — T83511A Infection and inflammatory reaction due to indwelling urethral catheter, initial encounter: Secondary | ICD-10-CM

## 2018-09-28 DIAGNOSIS — R1084 Generalized abdominal pain: Secondary | ICD-10-CM | POA: Diagnosis not present

## 2018-09-28 DIAGNOSIS — A419 Sepsis, unspecified organism: Secondary | ICD-10-CM | POA: Diagnosis not present

## 2018-09-28 DIAGNOSIS — T68XXXA Hypothermia, initial encounter: Secondary | ICD-10-CM | POA: Diagnosis not present

## 2018-09-28 DIAGNOSIS — T83518A Infection and inflammatory reaction due to other urinary catheter, initial encounter: Secondary | ICD-10-CM | POA: Diagnosis not present

## 2018-09-28 DIAGNOSIS — C61 Malignant neoplasm of prostate: Secondary | ICD-10-CM | POA: Diagnosis not present

## 2018-09-28 DIAGNOSIS — N136 Pyonephrosis: Secondary | ICD-10-CM | POA: Diagnosis present

## 2018-09-28 DIAGNOSIS — R319 Hematuria, unspecified: Secondary | ICD-10-CM | POA: Diagnosis not present

## 2018-09-28 DIAGNOSIS — Z818 Family history of other mental and behavioral disorders: Secondary | ICD-10-CM | POA: Diagnosis not present

## 2018-09-28 DIAGNOSIS — Z86718 Personal history of other venous thrombosis and embolism: Secondary | ICD-10-CM

## 2018-09-28 DIAGNOSIS — R5081 Fever presenting with conditions classified elsewhere: Secondary | ICD-10-CM | POA: Diagnosis present

## 2018-09-28 DIAGNOSIS — Z8619 Personal history of other infectious and parasitic diseases: Secondary | ICD-10-CM

## 2018-09-28 DIAGNOSIS — Z9001 Acquired absence of eye: Secondary | ICD-10-CM

## 2018-09-28 DIAGNOSIS — I110 Hypertensive heart disease with heart failure: Secondary | ICD-10-CM | POA: Diagnosis present

## 2018-09-28 DIAGNOSIS — D4959 Neoplasm of unspecified behavior of other genitourinary organ: Secondary | ICD-10-CM | POA: Diagnosis present

## 2018-09-28 DIAGNOSIS — N390 Urinary tract infection, site not specified: Secondary | ICD-10-CM

## 2018-09-28 DIAGNOSIS — Z9079 Acquired absence of other genital organ(s): Secondary | ICD-10-CM

## 2018-09-28 HISTORY — DX: Retention of urine, unspecified: R33.9

## 2018-09-28 HISTORY — DX: Personal history of other infectious and parasitic diseases: Z86.19

## 2018-09-28 LAB — URINALYSIS, ROUTINE W REFLEX MICROSCOPIC
BACTERIA UA: NONE SEEN
Bilirubin Urine: NEGATIVE
Glucose, UA: NEGATIVE mg/dL
Ketones, ur: NEGATIVE mg/dL
Nitrite: NEGATIVE
Protein, ur: 100 mg/dL — AB
RBC / HPF: >50 RBC/hpf — ABNORMAL HIGH (ref 0–5)
Specific Gravity, Urine: 1.017 (ref 1.005–1.030)
WBC, UA: >50 WBC/hpf — ABNORMAL HIGH (ref 0–5)
pH: 6 (ref 5.0–8.0)

## 2018-09-28 LAB — CBC WITH DIFFERENTIAL/PLATELET
Abs Immature Granulocytes: 3.34 10*3/uL — ABNORMAL HIGH (ref 0.00–0.07)
Basophils Absolute: 0.1 10*3/uL (ref 0.0–0.1)
Basophils Relative: 0 %
Eosinophils Absolute: 0.1 10*3/uL (ref 0.0–0.5)
Eosinophils Relative: 1 %
HEMATOCRIT: 28.5 % — AB (ref 39.0–52.0)
Hemoglobin: 8.5 g/dL — ABNORMAL LOW (ref 13.0–17.0)
Immature Granulocytes: 15 %
Lymphocytes Relative: 4 %
Lymphs Abs: 0.8 10*3/uL (ref 0.7–4.0)
MCH: 27.1 pg (ref 26.0–34.0)
MCHC: 29.8 g/dL — ABNORMAL LOW (ref 30.0–36.0)
MCV: 90.8 fL (ref 80.0–100.0)
Monocytes Absolute: 0.2 10*3/uL (ref 0.1–1.0)
Monocytes Relative: 1 %
Neutro Abs: 18.2 10*3/uL — ABNORMAL HIGH (ref 1.7–7.7)
Neutrophils Relative %: 79 %
Platelets: 150 10*3/uL (ref 150–400)
RBC: 3.14 MIL/uL — ABNORMAL LOW (ref 4.22–5.81)
RDW: 20.2 % — ABNORMAL HIGH (ref 11.5–15.5)
WBC: 22.7 10*3/uL — ABNORMAL HIGH (ref 4.0–10.5)
nRBC: 0 % (ref 0.0–0.2)

## 2018-09-28 LAB — COMPREHENSIVE METABOLIC PANEL
ALK PHOS: 60 U/L (ref 38–126)
ALT: 20 U/L (ref 0–44)
AST: 25 U/L (ref 15–41)
Albumin: 2.8 g/dL — ABNORMAL LOW (ref 3.5–5.0)
Anion gap: 7 (ref 5–15)
BUN: 15 mg/dL (ref 6–20)
CALCIUM: 8.1 mg/dL — AB (ref 8.9–10.3)
CO2: 27 mmol/L (ref 22–32)
Chloride: 104 mmol/L (ref 98–111)
Creatinine, Ser: 0.94 mg/dL (ref 0.61–1.24)
GFR calc Af Amer: >60 mL/min (ref >60)
GFR calc non Af Amer: >60 mL/min (ref >60)
Glucose, Bld: 123 mg/dL — ABNORMAL HIGH (ref 70–99)
Potassium: 3.2 mmol/L — ABNORMAL LOW (ref 3.5–5.1)
Sodium: 138 mmol/L (ref 135–145)
Total Bilirubin: 1 mg/dL (ref 0.3–1.2)
Total Protein: 6.5 g/dL (ref 6.5–8.1)

## 2018-09-28 LAB — TROPONIN I: Troponin I: <0.03 ng/mL (ref <0.03)

## 2018-09-28 LAB — INFLUENZA PANEL BY PCR (TYPE A & B)
INFLBPCR: NEGATIVE
Influenza A By PCR: NEGATIVE

## 2018-09-28 LAB — LACTIC ACID, PLASMA: Lactic Acid, Venous: 1.5 mmol/L (ref 0.5–1.9)

## 2018-09-28 MED ORDER — SODIUM CHLORIDE (PF) 0.9 % IJ SOLN
INTRAMUSCULAR | Status: AC
  Filled 2018-09-28: qty 50

## 2018-09-28 MED ORDER — POTASSIUM CHLORIDE CRYS ER 20 MEQ PO TBCR
40.0000 meq | EXTENDED_RELEASE_TABLET | Freq: Once | ORAL | Status: DC
  Filled 2018-09-28: qty 2

## 2018-09-28 MED ORDER — ACETAMINOPHEN 325 MG PO TABS
650.0000 mg | ORAL_TABLET | Freq: Four times a day (QID) | ORAL | Status: DC | PRN
  Administered 2018-09-29 (×2): 650 mg via ORAL
  Filled 2018-09-28 (×2): qty 2

## 2018-09-28 MED ORDER — ENOXAPARIN SODIUM 40 MG/0.4ML ~~LOC~~ SOLN: 40.0000 mg | SUBCUTANEOUS | Status: DC

## 2018-09-28 MED ORDER — ACETAMINOPHEN 500 MG PO TABS
1000.0000 mg | ORAL_TABLET | Freq: Once | ORAL | Status: AC
  Administered 2018-09-28: 1000 mg via ORAL
  Filled 2018-09-28: qty 2

## 2018-09-28 MED ORDER — ALPRAZOLAM 0.5 MG PO TABS: 1.0000 mg | ORAL_TABLET | Freq: Every day | ORAL | Status: DC | PRN

## 2018-09-28 MED ORDER — METOPROLOL SUCCINATE ER 50 MG PO TB24
50.0000 mg | ORAL_TABLET | Freq: Every day | ORAL | Status: DC
  Administered 2018-09-29 – 2018-09-30 (×2): 50 mg via ORAL
  Filled 2018-09-28 (×2): qty 1

## 2018-09-28 MED ORDER — HYDROCHLOROTHIAZIDE 25 MG PO TABS
50.0000 mg | ORAL_TABLET | Freq: Every day | ORAL | Status: DC
  Administered 2018-09-29 – 2018-09-30 (×2): 50 mg via ORAL
  Filled 2018-09-28 (×2): qty 2

## 2018-09-28 MED ORDER — BISACODYL 10 MG RE SUPP: 10.0000 mg | Freq: Every day | RECTAL | Status: DC | PRN

## 2018-09-28 MED ORDER — ACETAMINOPHEN 650 MG RE SUPP: 650.0000 mg | Freq: Four times a day (QID) | RECTAL | Status: DC | PRN

## 2018-09-28 MED ORDER — SODIUM CHLORIDE 0.9 % IV SOLN
1.0000 g | Freq: Three times a day (TID) | INTRAVENOUS | Status: DC
  Administered 2018-09-28 – 2018-09-30 (×5): 1 g via INTRAVENOUS
  Filled 2018-09-28 (×8): qty 1

## 2018-09-28 MED ORDER — ONDANSETRON HCL 4 MG PO TABS: 4.0000 mg | ORAL_TABLET | Freq: Four times a day (QID) | ORAL | Status: DC | PRN

## 2018-09-28 MED ORDER — ONDANSETRON HCL 4 MG/2ML IJ SOLN
4.0000 mg | Freq: Four times a day (QID) | INTRAMUSCULAR | Status: DC | PRN
  Administered 2018-09-29: 4 mg via INTRAVENOUS
  Filled 2018-09-28: qty 2

## 2018-09-28 MED ORDER — ESCITALOPRAM OXALATE 10 MG PO TABS
10.0000 mg | ORAL_TABLET | Freq: Every day | ORAL | Status: DC
  Administered 2018-09-28 – 2018-09-30 (×3): 10 mg via ORAL
  Filled 2018-09-28 (×3): qty 1

## 2018-09-28 MED ORDER — OXYCODONE HCL 5 MG PO TABS
5.0000 mg | ORAL_TABLET | ORAL | Status: DC | PRN
  Administered 2018-09-29 – 2018-09-30 (×7): 5 mg via ORAL
  Filled 2018-09-28 (×7): qty 1

## 2018-09-28 MED ORDER — SODIUM CHLORIDE 0.9 % IV BOLUS
1000.0000 mL | Freq: Once | INTRAVENOUS | Status: AC
  Administered 2018-09-28: 1000 mL via INTRAVENOUS

## 2018-09-28 MED ORDER — TRAZODONE HCL 50 MG PO TABS: 25.0000 mg | ORAL_TABLET | Freq: Every evening | ORAL | Status: DC | PRN

## 2018-09-28 MED ORDER — SODIUM CHLORIDE 0.9 % IV SOLN
2.0000 g | Freq: Once | INTRAVENOUS | Status: AC
  Administered 2018-09-28: 2 g via INTRAVENOUS
  Filled 2018-09-28: qty 2

## 2018-09-28 MED ORDER — DOCUSATE SODIUM 100 MG PO CAPS
100.0000 mg | ORAL_CAPSULE | Freq: Two times a day (BID) | ORAL | Status: DC
  Administered 2018-09-28 – 2018-09-30 (×4): 100 mg via ORAL
  Filled 2018-09-28 (×4): qty 1

## 2018-09-28 MED ORDER — HYDROMORPHONE HCL 1 MG/ML IJ SOLN
1.0000 mg | Freq: Once | INTRAMUSCULAR | Status: AC
  Administered 2018-09-28: 1 mg via INTRAVENOUS
  Filled 2018-09-28: qty 1

## 2018-09-28 MED ORDER — SODIUM CHLORIDE 0.9 % IV SOLN
INTRAVENOUS | Status: DC | PRN
  Administered 2018-09-28: 22:00:00 via INTRAVENOUS
  Administered 2018-09-29: 500 mL via INTRAVENOUS
  Administered 2018-09-30: 1000 mL via INTRAVENOUS

## 2018-09-28 MED ORDER — IOHEXOL 300 MG/ML  SOLN
100.0000 mL | Freq: Once | INTRAMUSCULAR | Status: AC | PRN
  Administered 2018-09-28: 100 mL via INTRAVENOUS

## 2018-09-28 NOTE — ED Triage Notes (Signed)
Per [facility] pt from home.  Diagnosed with prostate cancer in November currently getting treatments. C/o RLQ pains since last night. Has foley catheter and reports swelling.  Pt had fever with EMS and was given Tylenol 1000mg  in route.  Vitals: 140/92, 107HR, 97%, 104, CBG 179

## 2018-09-28 NOTE — H&P (Signed)
History and Physical    [name] YQM:[id number] DOB: 11-26-1965 DOA: 09/28/2018  PCP: Donald Prose, MD Patient coming from: Home  Chief Complaint: "Abdominal pain"  HPI: Dylan Gonzalez is a 53 y.o. 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  Patient reports having right lower abdominal pain since diagnosis of his prostate cancer in 8/19.  Pain gotten worse overnight and prompted him to come to ED today.  He also noted blood in urine.  Reports subjective fever, chills, nausea and dysuria since last night.  Denies emesis.  Denies URI symptoms, chest pain, cough, dyspnea, diarrhea, hematochezia or melena.  He tells me that he has had a Foley catheter changed about a week ago.  Lives with his wife.  Denies smoking cigarettes, drinking alcohol or recreational drug use.  Of note, patient complained of hematuria when he presented for his chemo 3 days ago and was started on Cipro.  In ED, vital signs stable.  CMP significant for hypokalemia to 3.3.  Troponin negative.  Lactic acid 1.5.  WBC 23.  Hemoglobin 8.5 (9.5 about 2 days ago).  UA with large hemoglobin and moderate LE.  CT abdomen with no acute intra-abdominal process but grossly unchanged necrotic central pelvic mass attached to posterior wall of his bladder consistent with prostate cancer, and unchanged LLL pulmonary nodule.  Urine and blood cultures drawn.  Given normal saline bolus 2 L and cefepime x1.  Hospitalist service was called for admission for sepsis.  I requested EDP for Foley exchange and urology consult.    Review of Systems  Constitutional: Positive for chills and fever. Negative for weight loss.  HENT: Negative for congestion and sore throat.   Eyes: Negative for blurred  vision and photophobia.  Respiratory: Negative for cough and shortness of breath.   Cardiovascular: Positive for leg swelling. Negative for chest pain and palpitations.  Gastrointestinal: Positive for abdominal pain, constipation and nausea. Negative for blood in stool, diarrhea, melena and vomiting.  Genitourinary: Positive for dysuria and hematuria.  Musculoskeletal: Negative for myalgias.  Skin: Negative for rash.  Neurological: Negative for sensory change, speech change, focal weakness and headaches.  Endo/Heme/Allergies: Does not bruise/bleed easily.  Psychiatric/Behavioral: Positive for depression. Negative for substance abuse. The patient is not nervous/anxious.     PMH Past Medical History:  Diagnosis Date  . CHF (congestive heart failure) ([place])   . Hypertension   . Malignant neoplasm metastatic to lung ([place]) 04/19/2018  . MI (myocardial infarction) ([place])    2007  . 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 CYSTOSCOPY W/ URETERAL STENT PLACEMENT Bilateral 04/13/2018   Procedure: CYSTOSCOPY WITH BILATERAL STENT REPLACEMENT;  Surgeon: Irine Seal, MD;  Location: [facility];  Service: Urology;  Laterality: Bilateral;  . eye removed    . HERNIA REPAIR    . INTRAOCULAR PROSTHESES INSERTION    . IR IMAGING GUIDED PORT INSERTION  06/21/2018  . IR IVC FILTER PLMT / S&I /IMG GUID/MOD SED  08/16/2018  . PROSTATE BIOPSY [name] 04/13/2018   Procedure: ULTRASOUND GUIDED PROSTATE BIOPSY;  Surgeon: Irine Seal, MD;  Location: [facility];  Service: Urology;  Laterality: [name];  . TOOTH EXTRACTION Right 04/21/2013   Procedure: EXTRACTION MOLARS;  Surgeon: [name], DDS;  Location: [facility];  Service: Oral Surgery;  Laterality: Right;  . TRANSURETHRAL RESECTION OF PROSTATE  04/13/2018   Procedure: TRANSURETHRAL RESECTION OF THE PROSTATE (TURP);  Surgeon: Irine Seal, MD;  Location: [facility];  Service: Urology;;   Carolin Guernsey Family History  Problem Relation Age of Onset  . Hypertension Father   . Depression Brother    Social Hx  reports that he has never smoked. He has never used smokeless tobacco. He reports that he does not drink alcohol or use drugs.   Allergy Allergies  Allergen Reactions  . Pollen Extract Other (See Comments)    Sneezing and shortness of breath   Home Meds Prior to Admission medications   Medication Sig Start Date End Date Taking? Authorizing Provider  albuterol (PROVENTIL HFA) 108 (90 Base) MCG/ACT inhaler Inhale 1-2 puffs into the lungs every 6 (six) hours as needed for wheezing or shortness of breath.   Yes [provider]  ALPRAZolam Duanne Moron) 1 MG tablet Take 1 mg by mouth daily as needed for anxiety. 09/11/18  Yes [provider]  bisacodyl (DULCOLAX) 10 MG suppository Place 1 suppository (10 mg total) rectally daily as needed for moderate constipation. 08/05/18  Yes Mikhail, Velta Addison, DO  ciprofloxacin (CIPRO) 500 MG tablet Take 1 tablet (500 mg total) by mouth 2 (two) times daily. 09/25/18  Yes Tanner, Lyndon Code., PA-C  Docusate Calcium (STOOL SOFTENER PO) Take 1 tablet by mouth daily as needed (constipation).    Yes [provider]  escitalopram (LEXAPRO) 10 MG tablet Take 10 mg by mouth daily. 09/26/18  Yes [provider]  fluticasone (FLONASE) 50 MCG/ACT nasal spray PLACE 2 SPRAYS INTO BOTH NOSTRILS DAILY. Patient taking differently: Place 2 sprays into both nostrils daily as needed for allergies.  07/22/14  Yes Le, Thao P, DO  hydrochlorothiazide (HYDRODIURIL) 50 MG tablet Take 50 mg by mouth daily.   Yes [provider]  lidocaine-prilocaine (EMLA) cream Apply 1 application topically as needed. Patient taking differently: Apply 1 application topically as needed (port access).  06/22/18  Yes [name], MD  metoprolol succinate (TOPROL-XL) 50 MG 24 hr tablet Take 50 mg by mouth daily. 05/17/18  Yes [provider]   ondansetron (ZOFRAN) 8 MG tablet Take 1 tablet (8 mg total) by mouth every 8 (eight) hours as needed for nausea or vomiting. 07/28/18  Yes [name], MD  oxyCODONE (OXY IR/ROXICODONE) 5 MG immediate release tablet Take 1-2 tablets (5-10 mg total) by mouth every 4 (four) hours as needed for severe pain. 09/18/18  Yes [name], MD  polyethylene glycol (MIRALAX / GLYCOLAX) packet Take 17 g by mouth 2 (two) times daily as needed for mild constipation or moderate constipation. 04/23/18  Yes Pollina, Gwenyth Allegra, MD  hydrOXYzine (VISTARIL) 100 MG capsule TAKE ONE CAPSULE BY MOUTH AT BEDTIME AS NEEDED FOR ALLERGY Patient not taking: Reported on 09/28/2018 10/12/15   Robyn Haber, MD    Physical Exam: Vitals:   09/28/18 1255 09/28/18 1500 09/28/18 1515 09/28/18 1530  BP:  132/90 (!) 140/93 (!) 132/92  Pulse:  92 96 95  Resp:    (!) 22  Temp:      TempSrc:      SpO2:  98% 100% 99%  Height: 5\' 11"  (1.803 m)       General: NAD, calm, comfortable Eyes: lids and conjunctivae normal.  Left eye enucleation HENT: atraumatic. Normocephalic. Hearing grossly intact. No rhinorrhea. MMM.  Neck/Endo: Supple. No mass. No thyromegaly Resp: normal effort. Good air movement bilaterally. No wheeze, rales or rhonchi.  CVS: RRR. S1 & S2 heard. No murmurs. GI: normal bowel sound.  Tenderness over RLQ GU: no suprapubic or CVA tenderness.  MSK: No obvious deformity. No focal tenderness. Moving extremities.  Trace lower extremity edema Skin: no apparent lesion. Normal warmth.  Neuro: AAOx4. CN 2-12 grossly intact. Normal strength. Light sensation grossly intact. DTR symmetric.  Psych: Calm. Normal judgment and insight.  Port a cath in the right chest.  Labs on Admission: I have personally reviewed following labs and imaging studies  CBC: Recent Labs  Lab 09/25/18 1050 09/28/18 1436  WBC 5.4 22.7*  NEUTROABS 3.3 18.2*  HGB 9.5* 8.5*  HCT 31.4* 28.5*  MCV 86.7 90.8  PLT 215 902   Basic  Metabolic Panel: Recent Labs  Lab 09/25/18 1050 09/28/18 1436  NA 144 138  K 3.8 3.2*  CL 106 104  CO2 30 27  GLUCOSE 115* 123*  BUN 11 15  CREATININE 0.90 0.94  CALCIUM 9.0 8.1*   GFR: Estimated Creatinine Clearance: 107.9 mL/min (by C-G formula based on SCr of 0.94 mg/dL). Liver Function Tests: Recent Labs  Lab 09/25/18 1050 09/28/18 1436  AST 17 25  ALT 15 20  ALKPHOS 69 60  BILITOT 0.3 1.0  PROT 7.4 6.5  ALBUMIN 2.7* 2.8*   No results for input(s): LIPASE, AMYLASE in the last 168 hours. No results for input(s): AMMONIA in the last 168 hours. Coagulation Profile: No results for input(s): INR, PROTIME in the last 168 hours. Cardiac Enzymes: Recent Labs  Lab 09/28/18 1436  TROPONINI <0.03   BNP (last 3 results) No results for input(s): PROBNP in the last 8760 hours. HbA1C: No results for input(s): HGBA1C in the last 72 hours. CBG: No results for input(s): GLUCAP in the last 168 hours. Lipid Profile: No results for input(s): CHOL, HDL, LDLCALC, TRIG, CHOLHDL, LDLDIRECT in the last 72 hours. Thyroid Function Tests: No results for input(s): TSH, T4TOTAL, FREET4, T3FREE, THYROIDAB in the last 72 hours. Anemia Panel: No results for input(s): VITAMINB12, FOLATE, FERRITIN, TIBC, IRON, RETICCTPCT in the last 72 hours. Urine analysis:    Component Value Date/Time   COLORURINE YELLOW 09/28/2018 1603   APPEARANCEUR HAZY (A) 09/28/2018 1603   LABSPEC 1.017 09/28/2018 1603   PHURINE 6.0 09/28/2018 1603   GLUCOSEU NEGATIVE 09/28/2018 1603   HGBUR LARGE (A) 09/28/2018 1603   BILIRUBINUR NEGATIVE 09/28/2018 1603   BILIRUBINUR neg 06/01/2014 1333   KETONESUR NEGATIVE 09/28/2018 1603   PROTEINUR 100 (A) 09/28/2018 1603   UROBILINOGEN 0.2 04/19/2015 0907   NITRITE NEGATIVE 09/28/2018 1603   LEUKOCYTESUR MODERATE (A) 09/28/2018 1603    Sepsis Labs:  Leukocytosis to 23 Lactic acid 1.5  Radiological Exams on Admission: Dg Chest 2 View  Result Date:  09/28/2018 CLINICAL DATA:  53 year old male with a history of leiomyosarcoma EXAM: CHEST - 2 VIEW COMPARISON:  08/13/2018, 08/04/2018, CT 05/22/2018 FINDINGS: Cardiomediastinal silhouette unchanged in size and contour. No evidence of central vascular congestion. No pneumothorax or pleural effusion. No confluent airspace disease. Unchanged right IJ port catheter with the tip appearing to terminate superior vena cava. Small nodules present on the CT 05/22/2018 are not visualized. IMPRESSION: Negative for acute cardiopulmonary disease. Unchanged right IJ port catheter Electronically Signed   By: Corrie Mckusick D.O.   On: 09/28/2018 15:05   Ct Abdomen Pelvis W Contrast  Result Date: 09/28/2018 CLINICAL DATA:  Right lower quadrant pain.  Currently being treated for prostate leiomyosarcoma. EXAM: CT ABDOMEN AND PELVIS WITH CONTRAST TECHNIQUE: Multidetector CT imaging of the abdomen and pelvis was performed using the standard protocol following bolus administration of intravenous contrast. CONTRAST:  122mL OMNIPAQUE IOHEXOL 300 MG/ML  SOLN COMPARISON:  CT abdomen pelvis dated August 14, 2018. FINDINGS: Lower chest: Unchanged 6 mm pulmonary nodule in the left lower lobe. New trace pericardial effusion. Hepatobiliary: Unchanged 1.5 cm right hepatic simple cyst. Additional subcentimeter low-density lesion in the inferior right hepatic lobe remains too small to characterize. No new focal liver abnormality. The gallbladder is decompressed. No biliary dilatation. Pancreas: Unremarkable. No pancreatic ductal dilatation or surrounding inflammatory changes. Spleen: Normal in size without focal abnormality. Adrenals/Urinary Tract: The adrenal glands are unremarkable. No focal renal mass. No renal or ureteral calculi. Unchanged bilateral ureteral stents. No hydronephrosis. Unchanged anterior displacement of the bladder, which remains decompressed by Foley catheter. Stomach/Bowel: Stomach is within normal limits. Appendix appears  normal. No evidence of bowel wall thickening, distention, or inflammatory changes. Vascular/Lymphatic: Interval placement of an infrarenal IVC filter. No significant vascular findings are present. No enlarged abdominal lymph nodes. Unchanged borderline enlarged right external iliac lymph node measuring 10 mm in short axis. Reproductive: Grossly unchanged necrotic central pelvic mass measuring 11.4 x 9.6 cm, previously 11.6 x 9.5 cm. This remains intimately associated with the posterior bladder wall. Other: No free fluid or pneumoperitoneum. Unchanged mild presacral stranding. Musculoskeletal: No acute or significant osseous findings. IMPRESSION: 1.  No acute intra-abdominal process.  Normal appendix. 2. Grossly unchanged necrotic central pelvic mass, consistent with history of prostate leiomyosarcoma. The mass remains inseparable from the posterior wall of the bladder. 3. Unchanged left lower lobe pulmonary nodule and borderline right external iliac adenopathy. 4. New trace pericardial effusion. Electronically Signed   By: Titus Dubin M.D.   On: 09/28/2018 16:37    All images have been reviewed by me personally.   EKG: Not obtained  Assessment/Plan Principal Problem:   Sepsis ([place]) Active Problems:   Hypertension   Prostate neoplasm   Malignant neoplasm metastatic to lung ([facility])   Anemia   Abdominal pain  Sepsis/?UTI/urinary retention/chronic Foley: Leukocytosis with mild tachycardia.  Also subjective fever.  Lactic acid negative.  Blood and urine culture obtained in ED. received 2 L of IV normal saline bolus.  Started on cefepime -Continue cefepime and de-escalate as appropriate -Requested EDP to change Foley and consult urology -Follow cultures -Trend leukocytosis  Advanced prostate cancer with possible metastasis to his lung: CT abdomen revealed this.  This is unchanged from prior his prior CT.  Followed by Dr. Alen Blew. -On chemotherapy-last chemo 3 days ago-4th cycle -FYI his  oncologist in the morning  RLQ abdominal pain/hematuria: Likely due to underlying malignancy. -Pain control  Hypertension: BP stable -Continue home meds  Anemia of chronic disease: Hemoglobin 8.5 (9.5 about 2 days ago).  Reports hematuria -Continue trending  History of CAD: Stable.  No anginal symptoms -Continue home meds  History of left femoral DVT: Status post IVC filter due to hematuria  DVT prophylaxis: SCD in the setting of hematuria Code Status: Full code Family Communication: No family member at bedside Disposition Plan: Admit to inpatient-MedSurg Consults called: Urology by EDP Admission status: Inpatient.  Immunocompromised patient with sepsis and advanced stage prostate cancer.  High risk for deconditioning.   Mercy Riding MD [facility] Pager [phone]  If 7PM-7AM, please contact night-coverage [email or web address] Password [facility]  09/28/2018, 7:56 PM

## 2018-09-28 NOTE — ED Provider Notes (Signed)
[facility] DEPT Provider Note   CSN: [id number] Arrival date & time: 09/28/18  1234     History   Chief Complaint Chief Complaint  Patient presents with  . Abdominal Pain  . Pelvic Pain    HPI Dylan Gonzalez is a 53 y.o. male with history of prostate cancer, with Foley, currently undergoing chemotherapy presenting today with increased abdominal pain, cough, fever/chills that began yesterday.  Patient states that since yesterday he has had increased right lower quadrant pain that he describes as a severe throbbing sensation constant worsened with palpation without alleviating factors.  Additionally patient with cough for the past few weeks, nonproductive, intermittent without aggravating or alleviating factors.  Denies hemoptysis, chest pain or shortness of breath.  Additionally patient with Foley, endorses increased burning with urination for the past few days.  Patient denies measuring temperature at home.  States that he has felt febrile and has had chills since yesterday.  Endorses nausea without vomiting.  States he had one episode of diarrhea last week.  He denies new extremity swelling, additional pain aside from above or any other concerns this time.  Patient arrives via EMS, received 1000 mg of Tylenol in route.  No recorded temperature.  HPI  Past Medical History:  Diagnosis Date  . CHF (congestive heart failure) ([place])   . Hypertension   . Malignant neoplasm metastatic to lung ([place]) 04/19/2018  . MI (myocardial infarction) ([place])    2007  . 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 Abdominal pain 09/28/2018  . Anemia 08/14/2018  . Symptomatic anemia 08/13/2018  . Acute UTI (urinary tract infection) 08/02/2018  . Deep vein thrombosis (DVT) of left lower extremity ([place]) 08/02/2018  . Bilateral lower extremity edema 08/01/2018  . CAD (coronary artery  disease) 08/01/2018  . Hypokalemia 08/01/2018  . Port-A-Cath in place 06/29/2018  . Goals of care, counseling/discussion 05/15/2018  . Bilateral ureteral obstruction 04/19/2018  . Malignant neoplasm metastatic to lung ([place]) 04/19/2018  . 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 Prostate neoplasm 04/13/2018  . Hypertension 12/26/2011  . 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 CYSTOSCOPY W/ URETERAL STENT PLACEMENT Bilateral 04/13/2018   Procedure: CYSTOSCOPY WITH BILATERAL STENT REPLACEMENT;  Surgeon: Irine Seal, MD;  Location: [facility];  Service: Urology;  Laterality: Bilateral;  . eye removed    . HERNIA REPAIR    . INTRAOCULAR PROSTHESES INSERTION    . IR IMAGING GUIDED PORT INSERTION  06/21/2018  . IR IVC FILTER PLMT / S&I /IMG GUID/MOD SED  08/16/2018  . PROSTATE BIOPSY [name] 04/13/2018   Procedure: ULTRASOUND GUIDED PROSTATE BIOPSY;  Surgeon: Irine Seal, MD;  Location: [facility];  Service: Urology;  Laterality: [name];  . TOOTH EXTRACTION Right 04/21/2013   Procedure: EXTRACTION MOLARS;  Surgeon: [name], DDS;  Location: [facility];  Service: Oral Surgery;  Laterality: Right;  . TRANSURETHRAL RESECTION OF PROSTATE  04/13/2018   Procedure: TRANSURETHRAL RESECTION OF THE PROSTATE (TURP);  Surgeon: Irine Seal, MD;  Location: [facility];  Service: Urology;;      Home Medications    Prior to Admission medications   Medication Sig Start Date End Date Taking? Authorizing Provider  albuterol (PROVENTIL HFA) 108 (90 Base) MCG/ACT inhaler Inhale 1-2 puffs into the lungs every 6 (six) hours as needed for wheezing or shortness of  breath.   Yes [provider]  ALPRAZolam Duanne Moron) 1 MG tablet Take 1 mg by mouth daily as needed for anxiety. 09/11/18  Yes [provider]  bisacodyl (DULCOLAX) 10 MG suppository Place 1 suppository (10 mg total) rectally daily as needed for moderate constipation. 08/05/18   Yes Mikhail, Velta Addison, DO  ciprofloxacin (CIPRO) 500 MG tablet Take 1 tablet (500 mg total) by mouth 2 (two) times daily. 09/25/18  Yes Tanner, Lyndon Code., PA-C  Docusate Calcium (STOOL SOFTENER PO) Take 1 tablet by mouth daily as needed (constipation).    Yes [provider]  escitalopram (LEXAPRO) 10 MG tablet Take 10 mg by mouth daily. 09/26/18  Yes [provider]  fluticasone (FLONASE) 50 MCG/ACT nasal spray PLACE 2 SPRAYS INTO BOTH NOSTRILS DAILY. Patient taking differently: Place 2 sprays into both nostrils daily as needed for allergies.  07/22/14  Yes Le, Thao P, DO  hydrochlorothiazide (HYDRODIURIL) 50 MG tablet Take 50 mg by mouth daily.   Yes [provider]  lidocaine-prilocaine (EMLA) cream Apply 1 application topically as needed. Patient taking differently: Apply 1 application topically as needed (port access).  06/22/18  Yes [name], MD  metoprolol succinate (TOPROL-XL) 50 MG 24 hr tablet Take 50 mg by mouth daily. 05/17/18  Yes [provider]  ondansetron (ZOFRAN) 8 MG tablet Take 1 tablet (8 mg total) by mouth every 8 (eight) hours as needed for nausea or vomiting. 07/28/18  Yes [name], MD  oxyCODONE (OXY IR/ROXICODONE) 5 MG immediate release tablet Take 1-2 tablets (5-10 mg total) by mouth every 4 (four) hours as needed for severe pain. 09/18/18  Yes [name], MD  polyethylene glycol (MIRALAX / GLYCOLAX) packet Take 17 g by mouth 2 (two) times daily as needed for mild constipation or moderate constipation. 04/23/18  Yes Pollina, Gwenyth Allegra, MD  hydrOXYzine (VISTARIL) 100 MG capsule TAKE ONE CAPSULE BY MOUTH AT BEDTIME AS NEEDED FOR ALLERGY Patient not taking: Reported on 09/28/2018 10/12/15   Robyn Haber, MD    Family History Family History  Problem Relation Age of Onset  . Hypertension Father   . Depression Brother     Social History Social History   Tobacco Use  . Smoking status: Never Smoker  . Smokeless  tobacco: Never Used  Substance Use Topics  . Alcohol use: No  . Drug use: No     Allergies   Pollen extract   Review of Systems Review of Systems  Constitutional: Positive for chills and fever.  Eyes: Negative.  Negative for visual disturbance.  Respiratory: Positive for cough. Negative for shortness of breath.   Cardiovascular: Positive for leg swelling (Patient states chronic). Negative for chest pain.  Gastrointestinal: Positive for abdominal pain, diarrhea and nausea. Negative for vomiting.  Genitourinary: Positive for dysuria. Negative for hematuria.  Musculoskeletal: Negative.  Negative for arthralgias and myalgias.  Neurological: Negative.  Negative for dizziness, weakness and headaches.  All other systems reviewed and are negative.  Physical Exam Updated Vital Signs BP (!) 132/92   Pulse 95   Temp 97.7 F (36.5 C) (Oral)   Resp (!) 22   Ht 5\' 11"  (1.803 m)   SpO2 99%   BMI 29.07 kg/m   Physical Exam Constitutional:      General: He is not in acute distress.    Appearance: He is well-developed. He is not ill-appearing or diaphoretic.  HENT:     Head: Normocephalic and atraumatic.     Right Ear:  External ear normal.     Left Ear: External ear normal.     Nose: Nose normal.     Mouth/Throat:     Mouth: Mucous membranes are moist.     Pharynx: Oropharynx is clear.  Eyes:     Extraocular Movements: Extraocular movements intact.     Pupils: Pupils are equal, round, and reactive to light.  Neck:     Musculoskeletal: Normal range of motion.     Trachea: Trachea normal. No tracheal deviation.  Cardiovascular:     Rate and Rhythm: Regular rhythm. Tachycardia present.     Heart sounds: Normal heart sounds.  Pulmonary:     Effort: Pulmonary effort is normal. No respiratory distress.     Breath sounds: Normal breath sounds. No rhonchi.  Abdominal:     General: Bowel sounds are normal.     Palpations: Abdomen is soft.     Tenderness: There is abdominal  tenderness in the right lower quadrant and periumbilical area. There is no guarding or rebound.  Genitourinary:    Comments: GU examination chaperoned by Herbie Baltimore EMT.  No lesions or bleeding noted.  Foley present with dark brown urine.  Small amount of buildup present on catheter.  Musculoskeletal: Normal range of motion.  Skin:    General: Skin is warm and dry.  Neurological:     General: No focal deficit present.     Mental Status: He is alert.     GCS: GCS eye subscore is 4. GCS verbal subscore is 5. GCS motor subscore is 6.     Comments: Speech is clear and goal oriented, follows commands Major Cranial nerves without deficit, no facial droop Normal strength in upper and lower extremities bilaterally including dorsiflexion and plantar flexion, strong and equal grip strength Sensation normal to light touch Moves extremities without ataxia, coordination intact  Psychiatric:        Mood and Affect: Mood normal.        Behavior: Behavior normal.    ED Treatments / Results  Labs (all labs ordered are listed, but only abnormal results are displayed) Labs Reviewed  COMPREHENSIVE METABOLIC PANEL - Abnormal; Notable for the following components:      Result Value   Potassium 3.2 (*)    Glucose, Bld 123 (*)    Calcium 8.1 (*)    Albumin 2.8 (*)    All other components within normal limits  CBC WITH DIFFERENTIAL/PLATELET - Abnormal; Notable for the following components:   WBC 22.7 (*)    RBC 3.14 (*)    Hemoglobin 8.5 (*)    HCT 28.5 (*)    MCHC 29.8 (*)    RDW 20.2 (*)    Neutro Abs 18.2 (*)    Abs Immature Granulocytes 3.34 (*)    All other components within normal limits  URINALYSIS, ROUTINE W REFLEX MICROSCOPIC - Abnormal; Notable for the following components:   APPearance HAZY (*)    Hgb urine dipstick LARGE (*)    Protein, ur 100 (*)    Leukocytes, UA MODERATE (*)    RBC / HPF >50 (*)    WBC, UA >50 (*)    All other components within normal limits  CULTURE, BLOOD  (ROUTINE X 2)  CULTURE, BLOOD (ROUTINE X 2)  URINE CULTURE  LACTIC ACID, PLASMA  TROPONIN I  INFLUENZA PANEL BY PCR (TYPE A & B)  CBC  CREATININE, SERUM  BASIC METABOLIC PANEL  CBC    EKG None  Radiology Dg Chest 2  View  Result Date: 09/28/2018 CLINICAL DATA:  53 year old male with a history of leiomyosarcoma EXAM: CHEST - 2 VIEW COMPARISON:  08/13/2018, 08/04/2018, CT 05/22/2018 FINDINGS: Cardiomediastinal silhouette unchanged in size and contour. No evidence of central vascular congestion. No pneumothorax or pleural effusion. No confluent airspace disease. Unchanged right IJ port catheter with the tip appearing to terminate superior vena cava. Small nodules present on the CT 05/22/2018 are not visualized. IMPRESSION: Negative for acute cardiopulmonary disease. Unchanged right IJ port catheter Electronically Signed   By: Corrie Mckusick D.O.   On: 09/28/2018 15:05   Ct Abdomen Pelvis W Contrast  Result Date: 09/28/2018 CLINICAL DATA:  Right lower quadrant pain. Currently being treated for prostate leiomyosarcoma. EXAM: CT ABDOMEN AND PELVIS WITH CONTRAST TECHNIQUE: Multidetector CT imaging of the abdomen and pelvis was performed using the standard protocol following bolus administration of intravenous contrast. CONTRAST:  152mL OMNIPAQUE IOHEXOL 300 MG/ML  SOLN COMPARISON:  CT abdomen pelvis dated August 14, 2018. FINDINGS: Lower chest: Unchanged 6 mm pulmonary nodule in the left lower lobe. New trace pericardial effusion. Hepatobiliary: Unchanged 1.5 cm right hepatic simple cyst. Additional subcentimeter low-density lesion in the inferior right hepatic lobe remains too small to characterize. No new focal liver abnormality. The gallbladder is decompressed. No biliary dilatation. Pancreas: Unremarkable. No pancreatic ductal dilatation or surrounding inflammatory changes. Spleen: Normal in size without focal abnormality. Adrenals/Urinary Tract: The adrenal glands are unremarkable. No focal renal  mass. No renal or ureteral calculi. Unchanged bilateral ureteral stents. No hydronephrosis. Unchanged anterior displacement of the bladder, which remains decompressed by Foley catheter. Stomach/Bowel: Stomach is within normal limits. Appendix appears normal. No evidence of bowel wall thickening, distention, or inflammatory changes. Vascular/Lymphatic: Interval placement of an infrarenal IVC filter. No significant vascular findings are present. No enlarged abdominal lymph nodes. Unchanged borderline enlarged right external iliac lymph node measuring 10 mm in short axis. Reproductive: Grossly unchanged necrotic central pelvic mass measuring 11.4 x 9.6 cm, previously 11.6 x 9.5 cm. This remains intimately associated with the posterior bladder wall. Other: No free fluid or pneumoperitoneum. Unchanged mild presacral stranding. Musculoskeletal: No acute or significant osseous findings. IMPRESSION: 1.  No acute intra-abdominal process.  Normal appendix. 2. Grossly unchanged necrotic central pelvic mass, consistent with history of prostate leiomyosarcoma. The mass remains inseparable from the posterior wall of the bladder. 3. Unchanged left lower lobe pulmonary nodule and borderline right external iliac adenopathy. 4. New trace pericardial effusion. Electronically Signed   By: Titus Dubin M.D.   On: 09/28/2018 16:37   Procedures Procedures (including critical care time)  Medications Ordered in ED Medications  sodium chloride (PF) 0.9 % injection (has no administration in time range)  enoxaparin (LOVENOX) injection 40 mg (has no administration in time range)  potassium chloride SA (K-DUR,KLOR-CON) CR tablet 40 mEq (has no administration in time range)  acetaminophen (TYLENOL) tablet 650 mg (has no administration in time range)    Or  acetaminophen (TYLENOL) suppository 650 mg (has no administration in time range)  oxyCODONE (Oxy IR/ROXICODONE) immediate release tablet 5 mg (has no administration in time  range)  traZODone (DESYREL) tablet 25 mg (has no administration in time range)  docusate sodium (COLACE) capsule 100 mg (has no administration in time range)  bisacodyl (DULCOLAX) suppository 10 mg (has no administration in time range)  ondansetron (ZOFRAN) tablet 4 mg (has no administration in time range)    Or  ondansetron (ZOFRAN) injection 4 mg (has no administration in time range)  sodium chloride 0.9 %  bolus 1,000 mL (1,000 mLs Intravenous New Bag/Given 09/28/18 1706)  ceFEPIme (MAXIPIME) 2 g in sodium chloride 0.9 % 100 mL IVPB (0 g Intravenous Stopped 09/28/18 1659)  iohexol (OMNIPAQUE) 300 MG/ML solution 100 mL (100 mLs Intravenous Contrast Given 09/28/18 1557)  acetaminophen (TYLENOL) tablet 1,000 mg (1,000 mg Oral Given 09/28/18 1706)  HYDROmorphone (DILAUDID) injection 1 mg (1 mg Intravenous Given 09/28/18 1707)  sodium chloride 0.9 % bolus 1,000 mL (1,000 mLs Intravenous New Bag/Given 09/28/18 1709)     Initial Impression / Assessment and Plan / ED Course  I have reviewed the triage vital signs and the nursing notes.  Pertinent labs & imaging results that were available during my care of the patient were reviewed by me and considered in my medical decision making (see chart for details).    53 year old male history of cancer currently undergoing chemotherapy presenting for 1 day of right pain, fever/chills and dysuria. On arrival patient is well-appearing, nontoxic however he is tachycardic.  Septic work-up begun. ------------------- 3:10 PM: CBC results with leukocytosis of 22,000, patient admits SIRS/sepsis criteria, code sepsis order. --------------- IV fluids and urosepsis antibiotic begun, cefepime Troponin negative CMP nonacute CBC with leukocytosis, anemia Urinalysis suggestive of UTI Chest x-ray negative for active disease CT abdomen pelvis pending ------------- CT abdomen pelvis:  IMPRESSION: 1.  No acute intra-abdominal process.  Normal appendix. 2. Grossly  unchanged necrotic central pelvic mass, consistent with history of prostate leiomyosarcoma. The mass remains inseparable from the posterior wall of the bladder. 3. Unchanged left lower lobe pulmonary nodule and borderline right external iliac adenopathy. 4. New trace pericardial effusion.  ----------------- Patient reevaluated multiple times, resting comfortably, no acute distress.  Vital signs stable. -------------------- Case discussed with hospitalist for admission, to see patient in emergency department. -------------- 7:40 PM: Discussed case with urology, Dr. Junious Silk who advises that Foley can be changed by ED nursing staff. ---------------- Patient has been admitted to hospitalist service for further evaluation and treatment.  Patient was seen and evaluated by Dr. Ellender Hose during this visit.  Note: Portions of this report may have been transcribed using voice recognition software. Every effort was made to ensure accuracy; however, inadvertent computerized transcription errors may still be present. Final Clinical Impressions(s) / ED Diagnoses   Final diagnoses:  Sepsis, due to unspecified organism, unspecified whether acute organ dysfunction present ([place])  Febrile neutropenia ([place])  Urinary tract infection associated with catheterization of urinary tract, unspecified indwelling urinary catheter type, initial encounter [facility])    ED Discharge Orders    None       [name] 09/28/18 1945    Duffy Bruce, MD 09/29/18 1428

## 2018-09-28 NOTE — Consult Note (Signed)
Consultation: Fever, indwelling urethral catheter and stents Requested by: Dylan Gonzalez   History of Present Illness: Dylan Gonzalez is a 53 year old male patient of Dr. 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  He has bilateral ureteral stents and an indwelling Foley catheter.  He was admitted for fever cough and chills.  He has been afebrile here, white count 22.7 and creatinine 0.94.  His urinalysis showed greater than 50 white blood cells, greater than 50 red blood cells and no bacteria.  Urine culture pending.  His Foley catheter was just changed in the office last week and is draining clear urine.  Bilateral ureteral stents are scheduled to be changed to October 19, 2018 by Dr. Jeffie Gonzalez.  Patient reports the catheter is draining well.  He underwent CT scan of the abdomen and pelvis which showed no hydronephrosis, bilateral stents in good position and Foley catheter in good position with the bladder displaced anteriorly from the mass.  Past Medical History:  Diagnosis Date  . CHF (congestive heart failure) ([place])   . Hypertension   . Malignant neoplasm metastatic to lung ([place]) 04/19/2018  . MI (myocardial infarction) ([place])    2007  . 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 CYSTOSCOPY W/ URETERAL STENT PLACEMENT Bilateral 04/13/2018   Procedure: CYSTOSCOPY WITH BILATERAL STENT REPLACEMENT;  Surgeon: Irine Seal, MD;  Location: [facility];  Service: Urology;  Laterality: Bilateral;  . eye removed    . HERNIA REPAIR    . INTRAOCULAR PROSTHESES INSERTION    . IR IMAGING GUIDED PORT INSERTION  06/21/2018  . IR IVC FILTER PLMT / S&I /IMG GUID/MOD SED  08/16/2018  . PROSTATE BIOPSY [name] 04/13/2018   Procedure: ULTRASOUND GUIDED PROSTATE BIOPSY;  Surgeon: Irine Seal, MD;  Location: [facility];  Service: Urology;  Laterality: [name];  . TOOTH EXTRACTION Right 04/21/2013   Procedure: EXTRACTION  MOLARS;  Surgeon: [name], DDS;  Location: [place];  Service: Oral Surgery;  Laterality: Right;  . TRANSURETHRAL RESECTION OF PROSTATE  04/13/2018   Procedure: TRANSURETHRAL RESECTION OF THE PROSTATE (TURP);  Surgeon: Irine Seal, MD;  Location: [facility];  Service: Urology;;    Home Medications:  Medications Prior to Admission  Medication Sig Dispense Refill Last Dose  . albuterol (PROVENTIL HFA) 108 (90 Base) MCG/ACT inhaler Inhale 1-2 puffs into the lungs every 6 (six) hours as needed for wheezing or shortness of breath.   unknown  . ALPRAZolam (XANAX) 1 MG tablet Take 1 mg by mouth daily as needed for anxiety.   unknown  . bisacodyl (DULCOLAX) 10 MG suppository Place 1 suppository (10 mg total) rectally daily as needed for moderate constipation. 12 suppository 0 unknown  . ciprofloxacin (CIPRO) 500 MG tablet Take 1 tablet (500 mg total) by mouth 2 (two) times daily. 10 tablet 0 09/28/2018 at Unknown time  . Docusate Calcium (STOOL SOFTENER PO) Take 1 tablet by mouth daily as needed (constipation).    unknown  . escitalopram (LEXAPRO) 10 MG tablet Take 10 mg by mouth daily.   unknown  . fluticasone (FLONASE) 50 MCG/ACT nasal spray PLACE 2 SPRAYS INTO BOTH NOSTRILS DAILY. (Patient taking differently: Place 2 sprays into both nostrils daily as needed for allergies. ) 16 g 9 unknown  . hydrochlorothiazide (HYDRODIURIL) 50 MG tablet Take 50 mg by mouth daily.   09/28/2018 at Unknown time  . lidocaine-prilocaine (EMLA) cream Apply 1 application topically  as needed. (Patient taking differently: Apply 1 application topically as needed (port access). ) 30 g 0 unknown  . metoprolol succinate (TOPROL-XL) 50 MG 24 hr tablet Take 50 mg by mouth daily.  1 09/28/2018 at Unknown time  . ondansetron (ZOFRAN) 8 MG tablet Take 1 tablet (8 mg total) by mouth every 8 (eight) hours as needed for nausea or vomiting. 20 tablet 0 unknown  . oxyCODONE (OXY IR/ROXICODONE) 5 MG immediate release tablet Take 1-2 tablets  (5-10 mg total) by mouth every 4 (four) hours as needed for severe pain. 90 tablet 0 unknown  . polyethylene glycol (MIRALAX / GLYCOLAX) packet Take 17 g by mouth 2 (two) times daily as needed for mild constipation or moderate constipation. 14 each 0 unknown  . hydrOXYzine (VISTARIL) 100 MG capsule TAKE ONE CAPSULE BY MOUTH AT BEDTIME AS NEEDED FOR ALLERGY (Patient not taking: Reported on 09/28/2018) 30 capsule 5 Not Taking at Unknown time   Allergies:  Allergies  Allergen Reactions  . Pollen Extract Other (See Comments)    Sneezing and shortness of breath    Family History  Problem Relation Age of Onset  . Hypertension Father   . Depression Brother    Social History:  reports that he has never smoked. He has never used smokeless tobacco. He reports that he does not drink alcohol or use drugs.  ROS: A complete review of systems was performed.  All systems are negative except for pertinent findings as noted. Review of Systems  Constitutional: Positive for chills and fever.  Gastrointestinal: Positive for abdominal pain and constipation.   Physical Exam:  Vital signs in last 24 hours: Temp:  [97.7 F (36.5 C)-98.4 F (36.9 C)] 98.1 F (36.7 C) (01/23 2143) Pulse Rate:  [92-106] 97 (01/23 2143) Resp:  [17-23] 20 (01/23 2143) BP: (131-145)/(88-101) 140/101 (01/23 2143) SpO2:  [98 %-100 %] 99 % (01/23 2143) General:  Alert and oriented, No acute distress HEENT: Normocephalic, atraumatic Cardiovascular: Regular rate and rhythm Lungs: Regular rate and effort Abdomen: Soft, nontender, nondistended, no abdominal masses Back: No CVA tenderness Extremities: No edema Neurologic: Grossly intact GU: Foley catheter in place, leg bag on left leg with clear urine  Laboratory Data:  Results for orders placed or performed during the hospital encounter of 09/28/18 (from the past 24 hour(s))  Lactic acid, plasma     Status: None   Collection Time: 09/28/18  2:36 PM  Result Value Ref Range    Lactic Acid, Venous 1.5 0.5 - 1.9 mmol/L  Comprehensive metabolic panel     Status: Abnormal   Collection Time: 09/28/18  2:36 PM  Result Value Ref Range   Sodium 138 135 - 145 mmol/L   Potassium 3.2 (L) 3.5 - 5.1 mmol/L   Chloride 104 98 - 111 mmol/L   CO2 27 22 - 32 mmol/L   Glucose, Bld 123 (H) 70 - 99 mg/dL   BUN 15 6 - 20 mg/dL   Creatinine, Ser 0.94 0.61 - 1.24 mg/dL   Calcium 8.1 (L) 8.9 - 10.3 mg/dL   Total Protein 6.5 6.5 - 8.1 g/dL   Albumin 2.8 (L) 3.5 - 5.0 g/dL   AST 25 15 - 41 U/L   ALT 20 0 - 44 U/L   Alkaline Phosphatase 60 38 - 126 U/L   Total Bilirubin 1.0 0.3 - 1.2 mg/dL   GFR calc non Af Amer >60 >60 mL/min   GFR calc Af Amer >60 >60 mL/min   Anion gap 7 5 -  15  CBC WITH DIFFERENTIAL     Status: Abnormal   Collection Time: 09/28/18  2:36 PM  Result Value Ref Range   WBC 22.7 (H) 4.0 - 10.5 K/uL   RBC 3.14 (L) 4.22 - 5.81 MIL/uL   Hemoglobin 8.5 (L) 13.0 - 17.0 g/dL   HCT 28.5 (L) 39.0 - 52.0 %   MCV 90.8 80.0 - 100.0 fL   MCH 27.1 26.0 - 34.0 pg   MCHC 29.8 (L) 30.0 - 36.0 g/dL   RDW 20.2 (H) 11.5 - 15.5 %   Platelets 150 150 - 400 K/uL   nRBC 0.0 0.0 - 0.2 %   Neutrophils Relative % 79 %   Neutro Abs 18.2 (H) 1.7 - 7.7 K/uL   Lymphocytes Relative 4 %   Lymphs Abs 0.8 0.7 - 4.0 K/uL   Monocytes Relative 1 %   Monocytes Absolute 0.2 0.1 - 1.0 K/uL   Eosinophils Relative 1 %   Eosinophils Absolute 0.1 0.0 - 0.5 K/uL   Basophils Relative 0 %   Basophils Absolute 0.1 0.0 - 0.1 K/uL   Immature Granulocytes 15 %   Abs Immature Granulocytes 3.34 (H) 0.00 - 0.07 K/uL   Tear Drop Cells PRESENT   Troponin I - ONCE - STAT     Status: None   Collection Time: 09/28/18  2:36 PM  Result Value Ref Range   Troponin I <0.03 <0.03 ng/mL  Urinalysis, Routine w reflex microscopic     Status: Abnormal   Collection Time: 09/28/18  4:03 PM  Result Value Ref Range   Color, Urine YELLOW YELLOW   APPearance HAZY (A) CLEAR   Specific Gravity, Urine 1.017 1.005 -  1.030   pH 6.0 5.0 - 8.0   Glucose, UA NEGATIVE NEGATIVE mg/dL   Hgb urine dipstick LARGE (A) NEGATIVE   Bilirubin Urine NEGATIVE NEGATIVE   Ketones, ur NEGATIVE NEGATIVE mg/dL   Protein, ur 100 (A) NEGATIVE mg/dL   Nitrite NEGATIVE NEGATIVE   Leukocytes, UA MODERATE (A) NEGATIVE   RBC / HPF >50 (H) 0 - 5 RBC/hpf   WBC, UA >50 (H) 0 - 5 WBC/hpf   Bacteria, UA NONE SEEN NONE SEEN   Mucus PRESENT    No results found for this or any previous visit (from the past 240 hour(s)). Creatinine: Recent Labs    09/25/18 1050 09/28/18 1436  CREATININE 0.90 0.94    Impression/Assessment/plan: Sepsis- unclear source.  UA shows no bacteria.  The patient's urine is clear and the UA showed no bacteria in the urine.  Urine and blood cultures pending.  Pelvic/prostate leiomyosarcoma with lung metastasis- undergoing chemotherapy  Hydronephrosis/urinary retention- stents to be changed October 19, 2018, Foley catheter changed last week. His catheter was just changed last week and stents are to be changed soon.  They did not need to be changed urgently, but I will send a note to Dr. Jeffie Gonzalez to let him know patient was in hospital so that he can follow-up on urine culture.   I will sign off, but send a note to Dr. Jeffie Gonzalez to follow-up on urine culture and consider timing of stent/catheter change.  Please page GU with any questions, concerns or changes in patient's status.   Dylan Gonzalez 09/28/2018, 9:55 PM

## 2018-09-28 NOTE — ED Triage Notes (Addendum)
Pt c/o pain in pelvic area where mass is. Reports has little constipation, but taking pain medications. LAST BM was yesterday morning. Has little nausea but denies vomiting and has medications at home for it but didn't take it.

## 2018-09-28 NOTE — ED Notes (Signed)
ED TO INPATIENT HANDOFF REPORT  Name/Age/Gender Francis Gaines 53 y.o. male  Code Status    Code Status Orders  (From admission, onward)         Start     Ordered   09/28/18 1817  Full code  Continuous     09/28/18 1818        Code Status History    Date Active Date Inactive Code Status Order ID Comments User Context   08/14/2018 0230 08/18/2018 2031 Full Code [id number]  Rise Patience, MD Inpatient   08/01/2018 2359 08/05/2018 1947 Full Code [id number]  Reubin Milan, MD ED   04/13/2018 1637 04/19/2018 1713 Full Code [id number]  Irine Seal, MD Inpatient      Home/SNF/Other Given to floor  Chief Complaint abd pain  Level of Care/Admitting Diagnosis ED Disposition    [facility] [place][id number]  Level of Care: Med-Surg [16]  Diagnosis: Abdominal pain [790240]  Admitting Physician: Mercy Riding [[id number]]  Attending Physician: Mercy Riding [[id number]]  Estimated length of stay: past midnight tomorrow  Certification:: I certify this patient will need inpatient services for at least 2 midnights  PT Class (Do Not Modify): Inpatient [101]  PT Acc Code (Do Not Modify): Private [1]       Medical History Past Medical History:  Diagnosis Date  . CHF (congestive heart failure) ([place])   . Hypertension   . Malignant neoplasm metastatic to lung ([place]) 04/19/2018  . MI (myocardial infarction) ([place])    2007  . 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 Pollen Extract Other (See Comments)    Sneezing and shortness of breath    IV Location/Drains/Wounds Patient Lines/Drains/Airways Status   Active Line/Drains/Airways    Name:   Placement date:   Placement time:   Site:   Days:   Implanted Port 06/21/18 Right Chest   06/21/18    1400    Chest   99   Urethral Catheter Brittney Fraser RN [place] 18 Fr.   08/03/18    1415     [place]   56   Ureteral Drain/Stent Right ureter 6 Fr.   04/13/18    1250    Right ureter   168   Ureteral Drain/Stent Left ureter 6 Fr.   04/13/18    1257    Left ureter   168   Incision - 1 Port Abdomen Right   06/21/18    -     99   Incision - 1 Port Lateral;Right;Superior   06/21/18    -     47          Labs/Imaging Results for orders placed or performed during the hospital encounter of 09/28/18 (from the past 48 hour(s))  Lactic acid, plasma     Status: None   Collection Time: 09/28/18  2:36 PM  Result Value Ref Range   Lactic Acid, Venous 1.5 0.5 - 1.9 mmol/L    Comment: Performed at [facility], [place] [address], [place], [place] [address]  Comprehensive metabolic panel     Status: Abnormal   Collection Time: 09/28/18  2:36 PM  Result Value Ref Range   Sodium 138 135 - 145 mmol/L   Potassium 3.2 (L) 3.5 - 5.1 mmol/L   Chloride 104 98 - 111 mmol/L   CO2 27 22 - 32  mmol/L   Glucose, Bld 123 (H) 70 - 99 mg/dL   BUN 15 6 - 20 mg/dL   Creatinine, Ser 0.94 0.61 - 1.24 mg/dL   Calcium 8.1 (L) 8.9 - 10.3 mg/dL   Total Protein 6.5 6.5 - 8.1 g/dL   Albumin 2.8 (L) 3.5 - 5.0 g/dL   AST 25 15 - 41 U/L   ALT 20 0 - 44 U/L   Alkaline Phosphatase 60 38 - 126 U/L   Total Bilirubin 1.0 0.3 - 1.2 mg/dL   GFR calc non Af Amer >60 >60 mL/min   GFR calc Af Amer >60 >60 mL/min   Anion gap 7 5 - 15    Comment: Performed at [facility], [place] [address] [place], [place] [address]  CBC WITH DIFFERENTIAL     Status: Abnormal   Collection Time: 09/28/18  2:36 PM  Result Value Ref Range   WBC 22.7 (H) 4.0 - 10.5 K/uL   RBC 3.14 (L) 4.22 - 5.81 MIL/uL   Hemoglobin 8.5 (L) 13.0 - 17.0 g/dL   HCT 28.5 (L) 39.0 - 52.0 %   MCV 90.8 80.0 - 100.0 fL   MCH 27.1 26.0 - 34.0 pg   MCHC 29.8 (L) 30.0 - 36.0 g/dL   RDW 20.2 (H) 11.5 - 15.5 %   Platelets 150 150 - 400 K/uL   nRBC 0.0 0.0 - 0.2 %   Neutrophils Relative % 79 %   Neutro Abs 18.2 (H) 1.7 - 7.7 K/uL    Lymphocytes Relative 4 %   Lymphs Abs 0.8 0.7 - 4.0 K/uL   Monocytes Relative 1 %   Monocytes Absolute 0.2 0.1 - 1.0 K/uL   Eosinophils Relative 1 %   Eosinophils Absolute 0.1 0.0 - 0.5 K/uL   Basophils Relative 0 %   Basophils Absolute 0.1 0.0 - 0.1 K/uL   Immature Granulocytes 15 %   Abs Immature Granulocytes 3.34 (H) 0.00 - 0.07 K/uL   Tear Drop Cells PRESENT     Comment: Performed at [facility], [place] [address] [place], [place] [address]  Troponin I - ONCE - STAT     Status: None   Collection Time: 09/28/18  2:36 PM  Result Value Ref Range   Troponin I <0.03 <0.03 ng/mL    Comment: Performed at [facility], [place] [address], [place], [place] [address]  Urinalysis, Routine w reflex microscopic     Status: Abnormal   Collection Time: 09/28/18  4:03 PM  Result Value Ref Range   Color, Urine YELLOW YELLOW   APPearance HAZY (A) CLEAR   Specific Gravity, Urine 1.017 1.005 - 1.030   pH 6.0 5.0 - 8.0   Glucose, UA NEGATIVE NEGATIVE mg/dL   Hgb urine dipstick LARGE (A) NEGATIVE   Bilirubin Urine NEGATIVE NEGATIVE   Ketones, ur NEGATIVE NEGATIVE mg/dL   Protein, ur 100 (A) NEGATIVE mg/dL   Nitrite NEGATIVE NEGATIVE   Leukocytes, UA MODERATE (A) NEGATIVE   RBC / HPF >50 (H) 0 - 5 RBC/hpf   WBC, UA >50 (H) 0 - 5 WBC/hpf   Bacteria, UA NONE SEEN NONE SEEN   Mucus PRESENT     Comment: Performed at [facility], [place] [address], [place],  [address]   Dg Chest 2 View  Result Date: 09/28/2018 CLINICAL DATA:  53 year old male with a history of leiomyosarcoma EXAM: CHEST - 2 VIEW COMPARISON:  08/13/2018, 08/04/2018, CT 05/22/2018 FINDINGS: Cardiomediastinal silhouette unchanged in size  and contour. No evidence of central vascular congestion. No pneumothorax or pleural effusion. No confluent airspace disease. Unchanged right IJ port catheter with the tip appearing to terminate superior vena cava. Small nodules present on the CT  05/22/2018 are not visualized. IMPRESSION: Negative for acute cardiopulmonary disease. Unchanged right IJ port catheter Electronically Signed   By: Corrie Mckusick D.O.   On: 09/28/2018 15:05   Ct Abdomen Pelvis W Contrast  Result Date: 09/28/2018 CLINICAL DATA:  Right lower quadrant pain. Currently being treated for prostate leiomyosarcoma. EXAM: CT ABDOMEN AND PELVIS WITH CONTRAST TECHNIQUE: Multidetector CT imaging of the abdomen and pelvis was performed using the standard protocol following bolus administration of intravenous contrast. CONTRAST:  1107mL OMNIPAQUE IOHEXOL 300 MG/ML  SOLN COMPARISON:  CT abdomen pelvis dated August 14, 2018. FINDINGS: Lower chest: Unchanged 6 mm pulmonary nodule in the left lower lobe. New trace pericardial effusion. Hepatobiliary: Unchanged 1.5 cm right hepatic simple cyst. Additional subcentimeter low-density lesion in the inferior right hepatic lobe remains too small to characterize. No new focal liver abnormality. The gallbladder is decompressed. No biliary dilatation. Pancreas: Unremarkable. No pancreatic ductal dilatation or surrounding inflammatory changes. Spleen: Normal in size without focal abnormality. Adrenals/Urinary Tract: The adrenal glands are unremarkable. No focal renal mass. No renal or ureteral calculi. Unchanged bilateral ureteral stents. No hydronephrosis. Unchanged anterior displacement of the bladder, which remains decompressed by Foley catheter. Stomach/Bowel: Stomach is within normal limits. Appendix appears normal. No evidence of bowel wall thickening, distention, or inflammatory changes. Vascular/Lymphatic: Interval placement of an infrarenal IVC filter. No significant vascular findings are present. No enlarged abdominal lymph nodes. Unchanged borderline enlarged right external iliac lymph node measuring 10 mm in short axis. Reproductive: Grossly unchanged necrotic central pelvic mass measuring 11.4 x 9.6 cm, previously 11.6 x 9.5 cm. This remains  intimately associated with the posterior bladder wall. Other: No free fluid or pneumoperitoneum. Unchanged mild presacral stranding. Musculoskeletal: No acute or significant osseous findings. IMPRESSION: 1.  No acute intra-abdominal process.  Normal appendix. 2. Grossly unchanged necrotic central pelvic mass, consistent with history of prostate leiomyosarcoma. The mass remains inseparable from the posterior wall of the bladder. 3. Unchanged left lower lobe pulmonary nodule and borderline right external iliac adenopathy. 4. New trace pericardial effusion. Electronically Signed   By: Titus Dubin M.D.   On: 09/28/2018 16:37    Pending Labs Unresulted Labs (From admission, onward)    Start     Ordered   10/05/18 0500  Creatinine, serum  (enoxaparin (LOVENOX)    CrCl >/= 30 ml/min)  Weekly,   R    Comments:  while on enoxaparin therapy    09/28/18 1818   09/29/18 1740  Basic metabolic panel  Tomorrow morning,   R     09/28/18 1818   09/29/18 0500  CBC  Tomorrow morning,   R     09/28/18 1818   09/28/18 1815  CBC  (enoxaparin (LOVENOX)    CrCl >/= 30 ml/min)  Once,   R    Comments:  Baseline for enoxaparin therapy IF NOT ALREADY DRAWN.  Notify MD if PLT < 100 K.    09/28/18 1818   09/28/18 1815  Creatinine, serum  (enoxaparin (LOVENOX)    CrCl >/= 30 ml/min)  Once,   R    Comments:  Baseline for enoxaparin therapy IF NOT ALREADY DRAWN.    09/28/18 1818   09/28/18 1650  Influenza panel by PCR (type A & B)  (Influenza PCR Panel)  Once,   R     09/28/18 1649   09/28/18 1411  Blood Culture (routine x 2)  BLOOD CULTURE X 2,   STAT     09/28/18 1411   09/28/18 1411  Urine culture  ONCE - STAT,   STAT     09/28/18 1411          Vitals/Pain Today's Vitals   09/28/18 1500 09/28/18 1515 09/28/18 1530 09/28/18 1705  BP: 132/90 (!) 140/93 (!) 132/92   Pulse: 92 96 95   Resp:   (!) 22   Temp:      TempSrc:      SpO2: 98% 100% 99%   Height:      PainSc:    7     Isolation  Precautions Droplet precaution  Medications Medications  sodium chloride (PF) 0.9 % injection (has no administration in time range)  enoxaparin (LOVENOX) injection 40 mg (has no administration in time range)  potassium chloride SA (K-DUR,KLOR-CON) CR tablet 40 mEq (has no administration in time range)  acetaminophen (TYLENOL) tablet 650 mg (has no administration in time range)    Or  acetaminophen (TYLENOL) suppository 650 mg (has no administration in time range)  oxyCODONE (Oxy IR/ROXICODONE) immediate release tablet 5 mg (has no administration in time range)  traZODone (DESYREL) tablet 25 mg (has no administration in time range)  docusate sodium (COLACE) capsule 100 mg (has no administration in time range)  bisacodyl (DULCOLAX) suppository 10 mg (has no administration in time range)  ondansetron (ZOFRAN) tablet 4 mg (has no administration in time range)    Or  ondansetron (ZOFRAN) injection 4 mg (has no administration in time range)  sodium chloride 0.9 % bolus 1,000 mL (1,000 mLs Intravenous New Bag/Given 09/28/18 1706)  ceFEPIme (MAXIPIME) 2 g in sodium chloride 0.9 % 100 mL IVPB (0 g Intravenous Stopped 09/28/18 1659)  iohexol (OMNIPAQUE) 300 MG/ML solution 100 mL (100 mLs Intravenous Contrast Given 09/28/18 1557)  acetaminophen (TYLENOL) tablet 1,000 mg (1,000 mg Oral Given 09/28/18 1706)  HYDROmorphone (DILAUDID) injection 1 mg (1 mg Intravenous Given 09/28/18 1707)  sodium chloride 0.9 % bolus 1,000 mL (1,000 mLs Intravenous New Bag/Given 09/28/18 1709)    Mobility non-ambulatory

## 2018-09-28 NOTE — Progress Notes (Signed)
Pharmacy Antibiotic Note  Dylan Gonzalez is a 53 y.o. male admitted on 09/28/2018 with UTI.  Pharmacy has been consulted for cefepime dosing.  Plan:  Cefepime 2 gr IV x1, then cefepime 1 gr IV q8h   Monitor clinical course, renal function, cultures as available   Height: 5\' 11"  (180.3 cm) IBW/kg (Calculated) : 75.3  Temp (24hrs), Avg:97.7 F (36.5 C), Min:97.7 F (36.5 C), Max:97.7 F (36.5 C)  Recent Labs  Lab 09/25/18 1050 09/28/18 1436  WBC 5.4 22.7*  CREATININE 0.90 0.94  LATICACIDVEN  --  1.5    Estimated Creatinine Clearance: 107.9 mL/min (by C-G formula based on SCr of 0.94 mg/dL).    Allergies  Allergen Reactions  . Pollen Extract Other (See Comments)    Sneezing and shortness of breath    Antimicrobials this admission: 1/23 cefepime >>     Dose adjustments this admission:   Microbiology results: 1/23 BCx:  1/23 UCx:  1/23 influenza panel PCR:      Thank you for allowing pharmacy to be a part of this patient's care.   Royetta Asal, PharmD, BCPS Pager [phone] 09/28/2018 8:14 PM

## 2018-09-28 NOTE — ED Notes (Signed)
Bed: WA09 Expected date:  Expected time:  Means of arrival:  Comments: Ems 53y/o m cancer

## 2018-09-29 ENCOUNTER — Other Ambulatory Visit: Payer: Self-pay

## 2018-09-29 DIAGNOSIS — D4959 Neoplasm of unspecified behavior of other genitourinary organ: Secondary | ICD-10-CM | POA: Diagnosis not present

## 2018-09-29 DIAGNOSIS — C7800 Secondary malignant neoplasm of unspecified lung: Secondary | ICD-10-CM | POA: Diagnosis not present

## 2018-09-29 DIAGNOSIS — D649 Anemia, unspecified: Secondary | ICD-10-CM | POA: Diagnosis not present

## 2018-09-29 LAB — URINE CULTURE: Culture: NO GROWTH

## 2018-09-29 LAB — BASIC METABOLIC PANEL
Anion gap: 6 (ref 5–15)
BUN: 12 mg/dL (ref 6–20)
CHLORIDE: 105 mmol/L (ref 98–111)
CO2: 28 mmol/L (ref 22–32)
Calcium: 7.9 mg/dL — ABNORMAL LOW (ref 8.9–10.3)
Creatinine, Ser: 0.71 mg/dL (ref 0.61–1.24)
GFR calc Af Amer: >60 mL/min (ref >60)
GFR calc non Af Amer: >60 mL/min (ref >60)
Glucose, Bld: 116 mg/dL — ABNORMAL HIGH (ref 70–99)
Potassium: 3.3 mmol/L — ABNORMAL LOW (ref 3.5–5.1)
Sodium: 139 mmol/L (ref 135–145)

## 2018-09-29 LAB — HEMOGLOBIN AND HEMATOCRIT, BLOOD
HEMATOCRIT: 29.8 % — AB (ref 39.0–52.0)
Hemoglobin: 9 g/dL — ABNORMAL LOW (ref 13.0–17.0)

## 2018-09-29 LAB — CBC
HEMATOCRIT: 26.3 % — AB (ref 39.0–52.0)
Hemoglobin: 7.8 g/dL — ABNORMAL LOW (ref 13.0–17.0)
MCH: 26.4 pg (ref 26.0–34.0)
MCHC: 29.7 g/dL — ABNORMAL LOW (ref 30.0–36.0)
MCV: 88.9 fL (ref 80.0–100.0)
Platelets: 150 10*3/uL (ref 150–400)
RBC: 2.96 MIL/uL — ABNORMAL LOW (ref 4.22–5.81)
RDW: 19.9 % — ABNORMAL HIGH (ref 11.5–15.5)
WBC: 20.7 10*3/uL — ABNORMAL HIGH (ref 4.0–10.5)
nRBC: 0 % (ref 0.0–0.2)

## 2018-09-29 LAB — PREPARE RBC (CROSSMATCH)

## 2018-09-29 LAB — MAGNESIUM: Magnesium: 1.9 mg/dL (ref 1.7–2.4)

## 2018-09-29 MED ORDER — POTASSIUM CHLORIDE CRYS ER 20 MEQ PO TBCR
40.0000 meq | EXTENDED_RELEASE_TABLET | ORAL | Status: AC
  Administered 2018-09-29 (×2): 40 meq via ORAL
  Filled 2018-09-29 (×2): qty 2

## 2018-09-29 MED ORDER — SODIUM CHLORIDE 0.9% IV SOLUTION
Freq: Once | INTRAVENOUS | Status: AC
  Administered 2018-09-29: 16:00:00 via INTRAVENOUS

## 2018-09-29 NOTE — Progress Notes (Signed)
PROGRESS NOTE  Dylan Gonzalez YQM:[id number] DOB: [birth year] DOA: 09/28/2018 PCP: Donald Prose, MD   LOS: 1 day   Brief Narrative / Interim history: Dylan Gonzalez is a 53 y.o. 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  Had significant leukocytosis and mild tachycardia.  Also subjective fever.  Lactic acid within normal.  CT abdomen with no acute intra-abdominal process but grossly unchanged necrotic central pelvic mass attached to posterior wall of his bladder consistent with prostate cancer, and unchanged LLL pulmonary nodule. Initially concern about sepsis due to his leukocytosis, subjective fever and he is chronic medical conditions.  Was given 2 L of IV fluid.  Blood and urine cultures drawn.  Started on cefepime and admitted.  Urology consulted.    Subjective: Major events overnight.  Still with abdominal pain.  Ports nausea but no emesis.  Vital signs within normal range overnight.  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  Vital signs stable.  Lactic acid negative -Appreciate urology input -Continue cefepime and de-escalate as appropriate -Follow cultures -Trend leukocytosis  Advanced prostate cancer with possible metastasis to his lung: CT abdomen revealed this.  This is unchanged from prior his prior CT.  Followed by Dr. Alen Blew. -On chemotherapy-last chemo 3 days ago-4th cycle -Appreciate urology input  RLQ abdominal pain/hematuria: Likely due to underlying malignancy. -Continue pain  control  Anemia of chronic disease:  Likely due to hematuria and chronic disease.  Slight drop from admission likely dilutional -Give 1 unit of blood  Hypertension: BP stable -Continue home meds  History of CAD: Stable.  No anginal symptoms -Continue home meds  History of left femoral DVT: Status post IVC filter due to hematuria  Gait disturbance: Wife concerned about his gait. -Consult physical therapy  Scheduled Meds: . sodium chloride   Intravenous Once  . docusate sodium  100 mg Oral BID  . escitalopram  10 mg Oral Daily  . hydrochlorothiazide  50 mg Oral Daily  . metoprolol succinate  50 mg Oral Daily  . potassium chloride  40 mEq Oral Once   Continuous Infusions: . sodium chloride 500 mL (09/29/18 0747)  . ceFEPime (MAXIPIME) IV 1 g (09/29/18 0800)   PRN Meds:.sodium chloride, acetaminophen **OR** acetaminophen, ALPRAZolam, bisacodyl, ondansetron **OR** ondansetron (ZOFRAN) IV, oxyCODONE, traZODone  DVT prophylaxis: SCD Code Status: Full code Family Communication: Wife and son updated on plan of care at bedside Disposition Plan: Remains inpatient  Consultants:   Urology  Procedures:   None  Antimicrobials:  Cefepime 1/23-->  Objective: Vitals:   09/28/18 2040 09/28/18 2143 09/29/18 0640 09/29/18 1253  BP: (!) 145/95 (!) 140/101 132/87 128/86  Pulse: 92 97 98 100  Resp: 17 20 16 20   Temp: 98.4 F (36.9 C) 98.1 F (36.7 C) 98.8 F (37.1 C) 98.2 F (36.8 C)  TempSrc: Oral Oral Oral Oral  SpO2: 100% 99% 96% 96%  Weight:  94.6 kg    Height:  5\' 11"  (1.803 m)      Intake/Output Summary (Last 24 hours) at 09/29/2018 1545 Last data filed at 09/29/2018 1258 Gross per  24 hour  Intake 580 ml  Output 275 ml  Net 305 ml   Filed Weights   09/28/18 2143  Weight: 94.6 kg    Examination:  GENERAL: Appears well. No acute distress.  HEENT: MMM.  Vision and Hearing grossly intact.  Left eye enucleation NECK: Supple.  No JVD.  LUNGS:  No IWOB. Good  air movement. CTAB.  HEART:  RRR. Heart sounds normal.  ABD: Bowel sounds present. Soft.  Tenderness over RLQ MSK/EXT: Trace edema bilaterally SKIN: no apparent skin lesion.  NEURO: Awake, alert and oriented appropriately.  No gross deficit.  PSYCH: Calm. Normal affect.   Data Reviewed: I have independently reviewed following labs and imaging studies   CBC: Recent Labs  Lab 09/25/18 1050 09/28/18 1436 09/29/18 0346  WBC 5.4 22.7* 20.7*  NEUTROABS 3.3 18.2*  --   HGB 9.5* 8.5* 7.8*  HCT 31.4* 28.5* 26.3*  MCV 86.7 90.8 88.9  PLT 215 150 315   Basic Metabolic Panel: Recent Labs  Lab 09/25/18 1050 09/28/18 1436 09/29/18 0346  NA 144 138 139  K 3.8 3.2* 3.3*  CL 106 104 105  CO2 30 27 28   GLUCOSE 115* 123* 116*  BUN 11 15 12   CREATININE 0.90 0.94 0.71  CALCIUM 9.0 8.1* 7.9*  MG  --   --  1.9   GFR: Estimated Creatinine Clearance: 126.8 mL/min (by C-G formula based on SCr of 0.71 mg/dL). Liver Function Tests: Recent Labs  Lab 09/25/18 1050 09/28/18 1436  AST 17 25  ALT 15 20  ALKPHOS 69 60  BILITOT 0.3 1.0  PROT 7.4 6.5  ALBUMIN 2.7* 2.8*   No results for input(s): LIPASE, AMYLASE in the last 168 hours. No results for input(s): AMMONIA in the last 168 hours. Coagulation Profile: No results for input(s): INR, PROTIME in the last 168 hours. Cardiac Enzymes: Recent Labs  Lab 09/28/18 1436  TROPONINI <0.03   BNP (last 3 results) No results for input(s): PROBNP in the last 8760 hours. HbA1C: No results for input(s): HGBA1C in the last 72 hours. CBG: No results for input(s): GLUCAP in the last 168 hours. Lipid Profile: No results for input(s): CHOL, HDL, LDLCALC, TRIG, CHOLHDL, LDLDIRECT in the last 72 hours. Thyroid Function Tests: No results for input(s): TSH, T4TOTAL, FREET4, T3FREE, THYROIDAB in the last 72 hours. Anemia Panel: No results for input(s): VITAMINB12, FOLATE, FERRITIN, TIBC, IRON, RETICCTPCT in the last 72 hours. Urine analysis:     Component Value Date/Time   COLORURINE YELLOW 09/28/2018 1603   APPEARANCEUR HAZY (A) 09/28/2018 1603   LABSPEC 1.017 09/28/2018 1603   PHURINE 6.0 09/28/2018 1603   GLUCOSEU NEGATIVE 09/28/2018 1603   HGBUR LARGE (A) 09/28/2018 1603   BILIRUBINUR NEGATIVE 09/28/2018 1603   BILIRUBINUR neg 06/01/2014 1333   KETONESUR NEGATIVE 09/28/2018 1603   PROTEINUR 100 (A) 09/28/2018 1603   UROBILINOGEN 0.2 04/19/2015 0907   NITRITE NEGATIVE 09/28/2018 1603   LEUKOCYTESUR MODERATE (A) 09/28/2018 1603   Sepsis Labs: Invalid input(s): PROCALCITONIN, LACTICIDVEN  Recent Results (from the past 240 hour(s))  Blood Culture (routine x 2)     Status: None (Preliminary result)   Collection Time: 09/28/18  2:36 PM  Result Value Ref Range Status   Specimen Description   Final    BLOOD PORT Performed at [facility] [address], [place], [place] [address]    Special Requests   Final    BOTTLES DRAWN AEROBIC AND ANAEROBIC Blood Culture adequate volume Performed at [facility]  [place] [address], [place], [place] [address]    Culture   Final    NO GROWTH < 24 HOURS Performed at [facility] [address], [place], [place] [address]    Report Status PENDING  Incomplete  Blood Culture (routine x 2)     Status: None (Preliminary result)   Collection Time: 09/28/18  3:25 PM  Result Value Ref Range Status   Specimen Description   Final    BLOOD BLOOD RIGHT FOREARM Performed at [facility] [address], [place], [place] [address]    Special Requests   Final    BOTTLES DRAWN AEROBIC AND ANAEROBIC Blood Culture adequate volume Performed at [facility] [address], [place], [place] [address]    Culture   Final    NO GROWTH < 24 HOURS Performed at [facility] [address], [place], [place] [address]    Report Status PENDING  Incomplete  Urine culture     Status: None   Collection Time: 09/28/18   4:03 PM  Result Value Ref Range Status   Specimen Description   Final    URINE, RANDOM Performed at [facility] [address], [place], [place] [address]    Special Requests   Final    NONE Performed at [facility], [place] [address], [place], [place] [address]    Culture   Final    NO GROWTH Performed at [facility], [place] [address] [place], [place] [address]    Report Status 09/29/2018 FINAL  Final      Radiology Studies: Ct Abdomen Pelvis W Contrast  Result Date: 09/28/2018 CLINICAL DATA:  Right lower quadrant pain. Currently being treated for prostate leiomyosarcoma. EXAM: CT ABDOMEN AND PELVIS WITH CONTRAST TECHNIQUE: Multidetector CT imaging of the abdomen and pelvis was performed using the standard protocol following bolus administration of intravenous contrast. CONTRAST:  186mL OMNIPAQUE IOHEXOL 300 MG/ML  SOLN COMPARISON:  CT abdomen pelvis dated August 14, 2018. FINDINGS: Lower chest: Unchanged 6 mm pulmonary nodule in the left lower lobe. New trace pericardial effusion. Hepatobiliary: Unchanged 1.5 cm right hepatic simple cyst. Additional subcentimeter low-density lesion in the inferior right hepatic lobe remains too small to characterize. No new focal liver abnormality. The gallbladder is decompressed. No biliary dilatation. Pancreas: Unremarkable. No pancreatic ductal dilatation or surrounding inflammatory changes. Spleen: Normal in size without focal abnormality. Adrenals/Urinary Tract: The adrenal glands are unremarkable. No focal renal mass. No renal or ureteral calculi. Unchanged bilateral ureteral stents. No hydronephrosis. Unchanged anterior displacement of the bladder, which remains decompressed by Foley catheter. Stomach/Bowel: Stomach is within normal limits. Appendix appears normal. No evidence of bowel wall thickening, distention, or inflammatory changes. Vascular/Lymphatic: Interval placement of an infrarenal IVC filter. No  significant vascular findings are present. No enlarged abdominal lymph nodes. Unchanged borderline enlarged right external iliac lymph node measuring 10 mm in short axis. Reproductive: Grossly unchanged necrotic central pelvic mass measuring 11.4 x 9.6 cm, previously 11.6 x 9.5 cm. This remains intimately associated with the posterior bladder wall. Other: No free fluid or pneumoperitoneum. Unchanged mild presacral stranding. Musculoskeletal: No acute or significant osseous findings. IMPRESSION: 1.  No acute intra-abdominal process.  Normal appendix. 2. Grossly unchanged necrotic central pelvic mass, consistent with history of prostate leiomyosarcoma. The mass remains inseparable from the posterior wall of the bladder. 3. Unchanged left lower lobe pulmonary nodule and borderline right external iliac adenopathy. 4.  New trace pericardial effusion. Electronically Signed   By: Titus Dubin M.D.   On: 09/28/2018 16:37     [facility] Pager [phone]  If 7PM-7AM, please contact night-coverage [email or web address] Password [facility] 09/29/2018, 3:45 PM

## 2018-09-30 DIAGNOSIS — D4959 Neoplasm of unspecified behavior of other genitourinary organ: Secondary | ICD-10-CM | POA: Diagnosis not present

## 2018-09-30 DIAGNOSIS — D649 Anemia, unspecified: Secondary | ICD-10-CM | POA: Diagnosis not present

## 2018-09-30 DIAGNOSIS — C7800 Secondary malignant neoplasm of unspecified lung: Secondary | ICD-10-CM

## 2018-09-30 DIAGNOSIS — R1031 Right lower quadrant pain: Secondary | ICD-10-CM | POA: Diagnosis not present

## 2018-09-30 LAB — BASIC METABOLIC PANEL
ANION GAP: 9 (ref 5–15)
BUN: 11 mg/dL (ref 6–20)
CHLORIDE: 103 mmol/L (ref 98–111)
CO2: 27 mmol/L (ref 22–32)
Calcium: 8.3 mg/dL — ABNORMAL LOW (ref 8.9–10.3)
Creatinine, Ser: 0.86 mg/dL (ref 0.61–1.24)
GFR calc Af Amer: >60 mL/min (ref >60)
GFR calc non Af Amer: >60 mL/min (ref >60)
Glucose, Bld: 160 mg/dL — ABNORMAL HIGH (ref 70–99)
Potassium: 3.4 mmol/L — ABNORMAL LOW (ref 3.5–5.1)
Sodium: 139 mmol/L (ref 135–145)

## 2018-09-30 LAB — CBC
HEMATOCRIT: 31.4 % — AB (ref 39.0–52.0)
HEMOGLOBIN: 9.4 g/dL — AB (ref 13.0–17.0)
MCH: 26.4 pg (ref 26.0–34.0)
MCHC: 29.9 g/dL — ABNORMAL LOW (ref 30.0–36.0)
MCV: 88.2 fL (ref 80.0–100.0)
Platelets: 153 10*3/uL (ref 150–400)
RBC: 3.56 MIL/uL — ABNORMAL LOW (ref 4.22–5.81)
RDW: 19.8 % — ABNORMAL HIGH (ref 11.5–15.5)
WBC: 13.2 10*3/uL — ABNORMAL HIGH (ref 4.0–10.5)
nRBC: 0 % (ref 0.0–0.2)

## 2018-09-30 LAB — TYPE AND SCREEN
ABO/RH(D): O POS
Antibody Screen: NEGATIVE
UNIT DIVISION: 0

## 2018-09-30 LAB — BPAM RBC
Blood Product Expiration Date: 202002272359
ISSUE DATE / TIME: 202001241813
Unit Type and Rh: 5100

## 2018-09-30 NOTE — Evaluation (Signed)
Physical Therapy Evaluation Patient Details Name: Dylan Gonzalez MRN: [id number] DOB: 27-Jul-1966 Today's Date: 09/30/2018   History of Present Illness  Pt admitted with abd pain and dx with UTI 2* indwelling foley catheter;  Pt with hx of prostate CA with lung mets, MI, CHF and L eye retinoblasty  Clinical Impression  Pt admitted as above and moving slowly but performing all mobility tasks unassisted.  Pt ambulating 300' in hall including back step, side step, stork stand and tandem step with min instability and no LOB noted.  Pt states he feels steadier on his feet than prior to admit and as [facility] and rollator at home if needed.  Will refer additional ambulation to nursing staff.  Pt hopeful for dc home this date.    Follow Up Recommendations No PT follow up    Equipment Recommendations  None recommended by PT    Recommendations for Other Services       Precautions / Restrictions Precautions Precautions: Fall Restrictions Weight Bearing Restrictions: No      Mobility  Bed Mobility Overal bed mobility: Modified Independent             General bed mobility comments: Pt unassisted to EOB  Transfers Overall transfer level: Modified independent               General transfer comment: Pt unassisted to stand from low bed  Ambulation/Gait Ambulation/Gait assistance: Supervision;Independent Gait Distance (Feet): 300 Feet Assistive device: None Gait Pattern/deviations: Step-through pattern;Decreased step length - right;Decreased step length - left;Shuffle;Trunk flexed Gait velocity: decr   General Gait Details: Pt with shuffling gait (?slippers from home) and slightly wider BOS but with no LOB and min instability including back step, side step, stork stand and tandem gait  Stairs            Wheelchair Mobility    Modified Rankin (Stroke Patients Only)       Balance Overall balance assessment: Independent                                            Pertinent Vitals/Pain Pain Assessment: 0-10 Pain Score: 3  Pain Location: pelvic area Pain Descriptors / Indicators: Sore Pain Intervention(s): Limited activity within patient's tolerance;Monitored during session    Home Living Family/patient expects to be discharged to:: Private residence Living Arrangements: Spouse/significant other Available Help at Discharge: Family Type of Home: House Home Access: Stairs to enter Entrance Stairs-Rails: Right Entrance Stairs-Number of Steps: 2 Home Layout: One level Home Equipment: Cane - single point;Walker - 4 wheels      Prior Function Level of Independence: Needs assistance   Gait / Transfers Assistance Needed: Pt states uses cane as needed at home and also has RW           Hand Dominance        Extremity/Trunk Assessment   Upper Extremity Assessment Upper Extremity Assessment: Generalized weakness    Lower Extremity Assessment Lower Extremity Assessment: Generalized weakness       Communication   Communication: No difficulties  Cognition Arousal/Alertness: Awake/alert Behavior During Therapy: WFL for tasks assessed/performed Overall Cognitive Status: Within Functional Limits for tasks assessed  General Comments      Exercises     Assessment/Plan    PT Assessment Patent does not need any further PT services  PT Problem List Decreased activity tolerance       PT Treatment Interventions      PT Goals (Current goals can be found in the Care Plan section)  Acute Rehab PT Goals Patient Stated Goal: HOME PT Goal Formulation: With patient Time For Goal Achievement: 10/07/18 Potential to Achieve Goals: Good    Frequency     Barriers to discharge        Co-evaluation               AM-PAC PT "6 Clicks" Mobility  Outcome Measure Help needed turning from your back to your side while in a flat bed without using bedrails?: None Help  needed moving from lying on your back to sitting on the side of a flat bed without using bedrails?: None Help needed moving to and from a bed to a chair (including a wheelchair)?: None Help needed standing up from a chair using your arms (e.g., wheelchair or bedside chair)?: None Help needed to walk in hospital room?: None Help needed climbing 3-5 steps with a railing? : A Little 6 Click Score: 23    End of Session Equipment Utilized During Treatment: Gait belt Activity Tolerance: Patient tolerated treatment well Patient left: in chair;with call bell/phone within reach Nurse Communication: Mobility status PT Visit Diagnosis: Difficulty in walking, not elsewhere classified (R26.2)    Time: 4503-8882 PT Time Calculation (min) (ACUTE ONLY): 26 min   Charges:   PT Evaluation $PT Eval Low Complexity: 1 Low PT Treatments $Gait Training: 8-22 mins        Debe Coder PT Acute Rehabilitation Services Pager [phone] Office [phone]   Kempton Milne 09/30/2018, 1:11 PM

## 2018-09-30 NOTE — Discharge Summary (Signed)
Physician Discharge Summary  ANEESH FALLER ZOX:[id number] DOB: [birth year] DOA: 09/28/2018  PCP: Donald Prose, MD  Admit date: 09/28/2018 Discharge date: 09/30/2018  Admitted From: Home Disposition: Home  Recommendations for Outpatient Follow-up:  1. Follow up with PCP and urology in 1-2 weeks 2. Please obtain BMP/CBC in one week 3. 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  Had significant leukocytosis and mild tachycardia.  Also subjective fever.  Lactic acid within normal.  CT abdomen with no acute intra-abdominal process but grossly unchanged necrotic central pelvic mass attached to posterior wall of his bladder consistent with prostate cancer, and unchanged LLL pulmonary nodule. Initially concern about sepsis due to his leukocytosis, subjective fever and he is chronic medical conditions.  Was given 2 L of IV fluid.  Blood and urine cultures drawn.  Started on cefepime and admitted.  Urology consulted by EDP and saw patient.  Urology did not feel Foley change is needed it was recently changed at in their office.  Patient continued on cefepime and remained stable.  Blood and urine culture negative.  Cefepime discontinued and patient was discharged on home Cipro. In regards to his anemia which was likely due to hematuria and chronic disease, he was transfused a unit of blood with appropriate response.   See individual problem list below for more. 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  Vital signs stable.  Lactic acid negative.  Urine and blood cultures negative.  Evaluated by urology.  Foley change was not needed as it was recently changed at urology office.  Discharged home in stable condition to complete his home Cipro.  Advanced prostate cancer with possible metastasis to his lung:CT abdomen revealed this. This is unchanged from prior his prior CT. Followed by Dr. Alen Blew. -On chemotherapy-last chemo 3 days ago-4thcycle -Appreciate urology input  RLQ abdominal pain/hematuria:Likely due to underlying malignancy. -Continue pain control  Anemia of chronic disease/hematuria: Likely due to hematuria and chronic disease.  Slight drop to 7.8 from admission likely dilutional.  Given history of CAD, received 1 unit with appropriate response.  Posttransfusion hemoglobin remained stable.  Recommend checking CBC at follow-up.  Hypertension:BP stable -Continue home meds  History of WJX:BJYNWG. No anginal symptoms -Continue home meds  History of left femoral NFA:OZHYQM post IVC filter due to hematuria  Gait disturbance: Wife concerned about his gait.  Evaluated by physical therapy and no need was identified.  Discharge Instructions  Discharge Instructions    Diet - low sodium heart healthy   Complete by:  As directed    Discharge instructions   Complete by:  As directed    You were admitted due to abdominal pain and blood in urine.  Was also a concern about possible infection.  The later was unlikely after the test this we have done.  The abdominal pain and blood in urine are likely due to prostate cancer.  You can continue  you antibiotic to complete the whole course.  In regards to your abdominal pain and blood in urine, we recommend  follow-up with urology.  Please read the directions on your medications before you take them.   Increase activity slowly   Complete by:  As directed      Allergies as of 09/30/2018      Reactions   Pollen Extract Other (See Comments)   Sneezing and shortness of breath      Medication List    TAKE these medications   ALPRAZolam 1 MG tablet Commonly known as:  XANAX Take 1 mg by mouth daily as needed for anxiety.   bisacodyl 10 MG suppository Commonly known as:  DULCOLAX Place 1 suppository (10 mg total) rectally daily as needed for moderate constipation.   ciprofloxacin 500 MG tablet Commonly known as:  CIPRO Take 1 tablet (500 mg total) by mouth 2 (two) times daily.   escitalopram 10 MG tablet Commonly known as:  LEXAPRO Take 10 mg by mouth daily.   fluticasone 50 MCG/ACT nasal spray Commonly known as:  FLONASE PLACE 2 SPRAYS INTO BOTH NOSTRILS DAILY. What changed:  See the new instructions.   hydrochlorothiazide 50 MG tablet Commonly known as:  HYDRODIURIL Take 50 mg by mouth daily.   hydrOXYzine 100 MG capsule Commonly known as:  VISTARIL TAKE ONE CAPSULE BY MOUTH AT BEDTIME AS NEEDED FOR ALLERGY   lidocaine-prilocaine cream Commonly known as:  EMLA Apply 1 application topically as needed. What changed:  reasons to take this   metoprolol succinate 50 MG 24 hr tablet Commonly known as:  TOPROL-XL Take 50 mg by mouth daily.   ondansetron 8 MG tablet Commonly known as:  ZOFRAN Take 1 tablet (8 mg total) by mouth every 8 (eight) hours as needed for nausea or vomiting.   oxyCODONE 5 MG immediate release tablet Commonly known as:  Oxy IR/ROXICODONE Take 1-2 tablets (5-10 mg total) by mouth every 4 (four) hours as needed for severe pain.   polyethylene glycol packet Commonly known as:  MIRALAX / GLYCOLAX Take 17 g by mouth 2 (two) times daily as needed for mild constipation or moderate constipation.   PROVENTIL HFA 108 (90 Base) MCG/ACT inhaler Generic  drug:  albuterol Inhale 1-2 puffs into the lungs every 6 (six) hours as needed for wheezing or shortness of breath.   STOOL SOFTENER PO Take 1 tablet by mouth daily as needed (constipation).      Follow-up Information    Donald Prose, MD Follow up.   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 No evidence of central vascular congestion. No pneumothorax or pleural effusion. No confluent airspace disease. Unchanged right IJ port catheter with the tip appearing to terminate superior vena cava. Small nodules present on the CT 05/22/2018 are not visualized. IMPRESSION: Negative for acute cardiopulmonary disease. Unchanged right IJ port catheter Electronically Signed   By: Corrie Mckusick D.O.   On: 09/28/2018 15:05   Ct Abdomen Pelvis W Contrast  Result Date: 09/28/2018 CLINICAL DATA:  Right lower quadrant pain. Currently being treated for prostate leiomyosarcoma. EXAM: CT ABDOMEN AND PELVIS  WITH CONTRAST TECHNIQUE: Multidetector CT imaging of the abdomen and pelvis was performed using the standard protocol following bolus administration of intravenous contrast. CONTRAST:  162mL OMNIPAQUE IOHEXOL 300 MG/ML  SOLN COMPARISON:  CT abdomen pelvis dated August 14, 2018. FINDINGS: Lower chest: Unchanged 6 mm pulmonary nodule in the left lower lobe. New trace pericardial effusion. Hepatobiliary: Unchanged 1.5 cm right hepatic simple cyst. Additional subcentimeter low-density lesion in the inferior right hepatic lobe remains too small to characterize. No new focal liver abnormality. The gallbladder is decompressed.  No biliary dilatation. Pancreas: Unremarkable. No pancreatic ductal dilatation or surrounding inflammatory changes. Spleen: Normal in size without focal abnormality. Adrenals/Urinary Tract: The adrenal glands are unremarkable. No focal renal mass. No renal or ureteral calculi. Unchanged bilateral ureteral stents. No hydronephrosis. Unchanged anterior displacement of the bladder, which remains decompressed by Foley catheter. Stomach/Bowel: Stomach is within normal limits. Appendix appears normal. No evidence of bowel wall thickening, distention, or inflammatory changes. Vascular/Lymphatic: Interval placement of an infrarenal IVC filter. No significant vascular findings are present. No enlarged abdominal lymph nodes. Unchanged borderline enlarged right external iliac lymph node measuring 10 mm in short axis. Reproductive: Grossly unchanged necrotic central pelvic mass measuring 11.4 x 9.6 cm, previously 11.6 x 9.5 cm. This remains intimately associated with the posterior bladder wall. Other: No free fluid or pneumoperitoneum. Unchanged mild presacral stranding. Musculoskeletal: No acute or significant osseous findings. IMPRESSION: 1.  No acute intra-abdominal process.  Normal appendix. 2. Grossly unchanged necrotic central pelvic mass, consistent with history of prostate leiomyosarcoma. The mass remains inseparable from the posterior wall of the bladder. 3. Unchanged left lower lobe pulmonary nodule and borderline right external iliac adenopathy. 4. New trace pericardial effusion. Electronically Signed   By: Titus Dubin M.D.   On: 09/28/2018 16:37      Subjective: No major events overnight of this morning.  Has concern about his hematuria.  Denies chest pain and dyspnea.  Still with abdominal pain but improved.  Discharge Exam: Vitals:   09/29/18 2100 09/30/18 0519  BP: (!) 141/85 (!) 138/100  Pulse: (!) 106 98  Resp: 18 18  Temp: 97.9 F (36.6 C) 98.3 F (36.8 C)  SpO2: 100% 100%    GENERAL:  Appears well. No acute distress.  HEENT: MMM.  Vision and Hearing grossly intact.  NECK: Supple.  No JVD.  LUNGS:  No IWOB. Good air movement. CTAB.  HEART:  RRR. Heart sounds normal.  ABD: Bowel sounds present. Soft. Tenderness over RLQ EXT:   no edema bilaterally.  SKIN: no apparent skin lesion.  NEURO: Awake, alert and oriented appropriately.  No gross deficit.  PSYCH: Calm. Normal affect.    The results of significant diagnostics from this hospitalization (including imaging, microbiology, ancillary and laboratory) are listed below for reference.     Microbiology: Recent Results (from the past 240 hour(s))  Blood Culture (routine x 2)     Status: None (Preliminary result)   Collection Time: 09/28/18  2:36 PM  Result Value Ref Range Status   Specimen Description   Final    BLOOD PORT Performed at [facility] [address], [place], [place] [address]    Special Requests   Final    BOTTLES DRAWN AEROBIC AND ANAEROBIC Blood Culture adequate volume Performed at [facility] [address], [place], [place] [address]    Culture   Final    NO GROWTH 2 DAYS Performed at [facility] [place],  [place] [address]    Report Status PENDING  Incomplete  Blood Culture (routine x 2)     Status: None (Preliminary result)   Collection Time: 09/28/18  3:25 PM  Result Value Ref Range Status   Specimen Description   Final    BLOOD BLOOD RIGHT FOREARM Performed at [facility] [address] [place], [place] [address]    Special Requests   Final    BOTTLES DRAWN AEROBIC AND ANAEROBIC Blood Culture adequate volume Performed at [facility] [address], [place], [place] [address]    Culture   Final    NO GROWTH 2 DAYS Performed at [facility] [address], [place], [place] [address]    Report Status PENDING  Incomplete  Urine culture     Status: None   Collection Time:  09/28/18  4:03 PM  Result Value Ref Range Status   Specimen Description   Final    URINE, RANDOM Performed at [facility] [address] [place], [place] [address]    Special Requests   Final    NONE Performed at [facility], [place] [address], [place], [place] [address]    Culture   Final    NO GROWTH Performed at [facility], [place] [address], [place], [place] [address]    Report Status 09/29/2018 FINAL  Final     Labs: BNP (last 3 results) Recent Labs    08/01/18 2150 08/13/18 1845  BNP 330.4* 44.9   Basic Metabolic Panel: Recent Labs  Lab 09/25/18 1050 09/28/18 1436 09/29/18 0346  NA 144 138 139  K 3.8 3.2* 3.3*  CL 106 104 105  CO2 30 27 28   GLUCOSE 115* 123* 116*  BUN 11 15 12   CREATININE 0.90 0.94 0.71  CALCIUM 9.0 8.1* 7.9*  MG  --   --  1.9   Liver Function Tests: Recent Labs  Lab 09/25/18 1050 09/28/18 1436  AST 17 25  ALT 15 20  ALKPHOS 69 60  BILITOT 0.3 1.0  PROT 7.4 6.5  ALBUMIN 2.7* 2.8*   No results for input(s): LIPASE, AMYLASE in the last 168 hours. No results for input(s): AMMONIA in the last 168 hours. CBC: Recent Labs  Lab 09/25/18 1050 09/28/18 1436 09/29/18 0346 09/29/18 2309 09/30/18 0500  WBC 5.4 22.7* 20.7*  --  13.2*  NEUTROABS 3.3 18.2*  --   --   --   HGB 9.5* 8.5* 7.8* 9.0* 9.4*  HCT 31.4* 28.5* 26.3* 29.8* 31.4*  MCV 86.7 90.8 88.9  --  88.2  PLT 215 150 150  --  153   Cardiac Enzymes: Recent Labs  Lab 09/28/18 1436  TROPONINI <0.03   BNP: Invalid input(s): POCBNP CBG: No results for input(s): GLUCAP in the last 168 hours. D-Dimer No results for input(s): DDIMER in the last 72 hours. Hgb A1c No results for input(s): HGBA1C in the last 72 hours. Lipid Profile No results for input(s): CHOL, HDL, LDLCALC, TRIG, CHOLHDL, LDLDIRECT in the last 72 hours. Thyroid function studies No results for input(s): TSH, T4TOTAL, T3FREE, THYROIDAB in the last 72  hours.  Invalid input(s): FREET3 Anemia work up No results for input(s): VITAMINB12, FOLATE, FERRITIN, TIBC, IRON, RETICCTPCT in the last 72 hours. Urinalysis    Component Value Date/Time   COLORURINE YELLOW 09/28/2018 1603   APPEARANCEUR HAZY (A) 09/28/2018 1603   LABSPEC 1.017 09/28/2018 1603   PHURINE 6.0 09/28/2018 1603   GLUCOSEU  NEGATIVE 09/28/2018 1603   HGBUR LARGE (A) 09/28/2018 1603   BILIRUBINUR NEGATIVE 09/28/2018 1603   BILIRUBINUR neg 06/01/2014 1333   KETONESUR NEGATIVE 09/28/2018 1603   PROTEINUR 100 (A) 09/28/2018 1603   UROBILINOGEN 0.2 04/19/2015 0907   NITRITE NEGATIVE 09/28/2018 1603   LEUKOCYTESUR MODERATE (A) 09/28/2018 1603   Sepsis Labs Invalid input(s): PROCALCITONIN,  WBC,  LACTICIDVEN  Time coordinating discharge: 35 minutes  SIGNED:  Mercy Riding, MD  [facility] 09/30/2018, 11:38 AM Pager [phone]  If 7PM-7AM, please contact night-coverage [email or web address] Password [email or web address]

## 2018-09-30 NOTE — Progress Notes (Signed)
Patient ID: Dylan Gonzalez, male   DOB: December 22, 1965, 53 y.o.   MRN: [id number]      Subjective: [place] is feeling somewhat better.  He did have an episode of incontinence and his urine is dark but not with active bleeding.   His culture is negative and the CT is unchanged.  ROS:  Review of Systems  Constitutional: Negative for chills and fever.  Gastrointestinal: Negative for abdominal pain.    Anti-infectives: Anti-infectives (From admission, onward)   Start     Dose/Rate Route Frequency Ordered Stop   09/29/18 0000  ceFEPIme (MAXIPIME) 1 g in sodium chloride 0.9 % 100 mL IVPB     1 g 200 mL/hr over 30 Minutes Intravenous Every 8 hours 09/28/18 2015     09/28/18 1445  ceFEPIme (MAXIPIME) 2 g in sodium chloride 0.9 % 100 mL IVPB     2 g 200 mL/hr over 30 Minutes Intravenous  Once 09/28/18 1441 09/28/18 1659      Current Facility-Administered Medications  Medication Dose Route Frequency Provider Last Rate Last Dose  . 0.9 %  sodium chloride infusion   Intravenous PRN Wendee Beavers T, MD 10 mL/hr at 09/29/18 0747 500 mL at 09/29/18 0747  . acetaminophen (TYLENOL) tablet 650 mg  650 mg Oral Q6H PRN Mercy Riding, MD   650 mg at 09/29/18 1457   Or  . acetaminophen (TYLENOL) suppository 650 mg  650 mg Rectal Q6H PRN Mercy Riding, MD      . ALPRAZolam Duanne Moron) tablet 1 mg  1 mg Oral Daily PRN Wendee Beavers T, MD      . bisacodyl (DULCOLAX) suppository 10 mg  10 mg Rectal Daily PRN Gonfa, Taye T, MD      . ceFEPIme (MAXIPIME) 1 g in sodium chloride 0.9 % 100 mL IVPB  1 g Intravenous Q8H Glogovac, Nikola, RPH 200 mL/hr at 09/29/18 2316 1 g at 09/29/18 2316  . docusate sodium (COLACE) capsule 100 mg  100 mg Oral BID Wendee Beavers T, MD   100 mg at 09/29/18 2122  . escitalopram (LEXAPRO) tablet 10 mg  10 mg Oral Daily Gonfa, Taye T, MD   10 mg at 09/29/18 1011  . hydrochlorothiazide (HYDRODIURIL) tablet 50 mg  50 mg Oral Daily Wendee Beavers T, MD   50 mg at 09/29/18 1011  . metoprolol succinate  (TOPROL-XL) 24 hr tablet 50 mg  50 mg Oral Daily Gonfa, Taye T, MD   50 mg at 09/29/18 1011  . ondansetron (ZOFRAN) tablet 4 mg  4 mg Oral Q6H PRN Wendee Beavers T, MD       Or  . ondansetron (ZOFRAN) injection 4 mg  4 mg Intravenous Q6H PRN Mercy Riding, MD   4 mg at 09/29/18 0739  . oxyCODONE (Oxy IR/ROXICODONE) immediate release tablet 5 mg  5 mg Oral Q4H PRN Wendee Beavers T, MD   5 mg at 09/30/18 0517  . potassium chloride SA (K-DUR,KLOR-CON) CR tablet 40 mEq  40 mEq Oral Once Wendee Beavers T, MD      . traZODone (DESYREL) tablet 25 mg  25 mg Oral QHS PRN Wendee Beavers T, MD         Objective: Vital signs in last 24 hours: Temp:  [97.9 F (36.6 C)-98.5 F (36.9 C)] 98.3 F (36.8 C) (01/25 0519) Pulse Rate:  [97-106] 98 (01/25 0519) Resp:  [18-20] 18 (01/25 0519) BP: (128-141)/(85-100) 138/100 (01/25 0519) SpO2:  [96 %-100 %] 100 % (01/25  7322) Weight:  [94.2 kg] 94.2 kg (01/25 0519)  Intake/Output from previous day: 01/24 0701 - 01/25 0700 In: 975 [P.O.:630; Blood:345] Out: 575 [Urine:575] Intake/Output this shift: No intake/output data recorded.   Physical Exam Vitals signs reviewed.  Constitutional:      Appearance: He is well-developed.  Neurological:     Mental Status: He is alert.     Lab Results:  Recent Labs    09/28/18 1436 09/29/18 0346 09/29/18 2309  WBC 22.7* 20.7*  --   HGB 8.5* 7.8* 9.0*  HCT 28.5* 26.3* 29.8*  PLT 150 150  --    BMET Recent Labs    09/28/18 1436 09/29/18 0346  NA 138 139  K 3.2* 3.3*  CL 104 105  CO2 27 28  GLUCOSE 123* 116*  BUN 15 12  CREATININE 0.94 0.71  CALCIUM 8.1* 7.9*   PT/INR No results for input(s): LABPROT, INR in the last 72 hours. ABG No results for input(s): PHART, HCO3 in the last 72 hours.  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 No evidence of central vascular congestion. No pneumothorax or pleural effusion. No confluent airspace disease. Unchanged right IJ port catheter with the tip appearing to terminate superior vena cava. Small nodules present on the CT 05/22/2018 are not visualized. IMPRESSION: Negative for acute cardiopulmonary disease. Unchanged right IJ port catheter Electronically Signed   By: Corrie Mckusick D.O.   On: 09/28/2018 15:05   Ct Abdomen Pelvis W Contrast  Result Date: 09/28/2018 CLINICAL DATA:  Right lower quadrant pain. Currently being treated for prostate leiomyosarcoma. EXAM: CT ABDOMEN AND PELVIS WITH CONTRAST TECHNIQUE: Multidetector CT imaging of the abdomen and pelvis was performed using the standard protocol following bolus administration of intravenous contrast. CONTRAST:  163mL OMNIPAQUE IOHEXOL 300 MG/ML  SOLN COMPARISON:  CT abdomen pelvis dated August 14, 2018. FINDINGS: Lower chest: Unchanged 6 mm pulmonary nodule in the left lower lobe. New trace pericardial effusion. Hepatobiliary: Unchanged 1.5 cm right hepatic simple cyst. Additional subcentimeter low-density lesion in the inferior right hepatic lobe remains too small to characterize. No new focal liver abnormality. The gallbladder is decompressed. No biliary dilatation. Pancreas: Unremarkable. No pancreatic ductal dilatation or surrounding inflammatory changes. Spleen: Normal in size without focal abnormality. Adrenals/Urinary Tract: The adrenal glands are unremarkable. No focal renal mass. No renal or ureteral calculi. Unchanged bilateral ureteral stents. No hydronephrosis. Unchanged anterior displacement of the bladder, which remains decompressed by Foley catheter. Stomach/Bowel: Stomach is within normal limits. Appendix appears normal. No evidence of bowel wall thickening, distention, or inflammatory changes. Vascular/Lymphatic: Interval placement of an infrarenal  IVC filter. No significant vascular findings are present. No enlarged abdominal lymph nodes. Unchanged borderline enlarged right external iliac lymph node measuring 10 mm in short axis. Reproductive: Grossly unchanged necrotic central pelvic mass measuring 11.4 x 9.6 cm, previously 11.6 x 9.5 cm. This remains intimately associated with the posterior bladder wall. Other: No free fluid or pneumoperitoneum. Unchanged mild presacral stranding. Musculoskeletal: No acute or significant osseous findings. IMPRESSION: 1.  No acute intra-abdominal process.  Normal appendix. 2. Grossly unchanged necrotic central pelvic mass, consistent with history of prostate leiomyosarcoma. The mass remains inseparable from the posterior wall of the bladder. 3. Unchanged left lower lobe pulmonary nodule and borderline right external iliac adenopathy. 4.  New trace pericardial effusion. Electronically Signed   By: Titus Dubin M.D.   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   The urine culture was negative and the CT shows no change in the necrotic pelvic mass.   I discussed continued foley drainage with pending stent change vs conversion to bilateral PCN's and he would like to stay the course with the stents and foley.  I communicated with Dr. Alen Blew and Dr. Kathlene Cote and at this time there is no indication to try to drain the necrotic center of the mass.        LOS: 2 days    Irine Seal 09/30/2018 [phone]

## 2018-10-03 LAB — CULTURE, BLOOD (ROUTINE X 2)
Culture: NO GROWTH
Culture: NO GROWTH
SPECIAL REQUESTS: ADEQUATE
Special Requests: ADEQUATE

## 2018-10-04 ENCOUNTER — Telehealth: Payer: Self-pay | Admitting: *Deleted

## 2018-10-04 NOTE — Telephone Encounter (Signed)
Spoke with patient's wife dionne, she states they would like a hospice referral. Per dr Alen Blew, he will be the attending, hospice physicians may do symptom management and have DNR signed. They will contact wife today to set up an appt for  assessment of patient's needs.

## 2018-10-05 ENCOUNTER — Telehealth: Payer: Self-pay | Admitting: *Deleted

## 2018-10-05 DIAGNOSIS — C7800 Secondary malignant neoplasm of unspecified lung: Secondary | ICD-10-CM | POA: Diagnosis not present

## 2018-10-05 NOTE — Telephone Encounter (Signed)
rec'd call from patient's wife. She states hospice came to the home to do an assessment and they were told he is not appropriate, since he is currently taking chemotherapy. He was offered palliative care. Patient and wife state they want to speak with dr Alen Blew, at his next appt on 10/09/2018, to decide which route to take. Did state he was running a fever. Instructed to take 2 tylenol now and if no results, to call back to desk nurse. Wife dionne verbalized understanding.

## 2018-10-09 ENCOUNTER — Inpatient Hospital Stay: Payer: BLUE CROSS/BLUE SHIELD

## 2018-10-09 ENCOUNTER — Inpatient Hospital Stay: Payer: BLUE CROSS/BLUE SHIELD | Attending: Oncology

## 2018-10-09 ENCOUNTER — Inpatient Hospital Stay (HOSPITAL_BASED_OUTPATIENT_CLINIC_OR_DEPARTMENT_OTHER): Payer: BLUE CROSS/BLUE SHIELD | Admitting: Oncology

## 2018-10-09 ENCOUNTER — Telehealth: Payer: Self-pay | Admitting: Oncology

## 2018-10-09 VITALS — BP 143/92 | HR 94 | Temp 98.2°F | Resp 17

## 2018-10-09 VITALS — BP 148/98 | HR 106 | Temp 98.1°F | Resp 18 | Ht 71.0 in | Wt 208.3 lb

## 2018-10-09 DIAGNOSIS — C494 Malignant neoplasm of connective and soft tissue of abdomen: Secondary | ICD-10-CM

## 2018-10-09 DIAGNOSIS — Z86718 Personal history of other venous thrombosis and embolism: Secondary | ICD-10-CM | POA: Insufficient documentation

## 2018-10-09 DIAGNOSIS — R600 Localized edema: Secondary | ICD-10-CM | POA: Diagnosis not present

## 2018-10-09 DIAGNOSIS — Z5189 Encounter for other specified aftercare: Secondary | ICD-10-CM | POA: Diagnosis not present

## 2018-10-09 DIAGNOSIS — C61 Malignant neoplasm of prostate: Secondary | ICD-10-CM | POA: Diagnosis not present

## 2018-10-09 DIAGNOSIS — C7800 Secondary malignant neoplasm of unspecified lung: Secondary | ICD-10-CM | POA: Insufficient documentation

## 2018-10-09 DIAGNOSIS — Z5111 Encounter for antineoplastic chemotherapy: Secondary | ICD-10-CM | POA: Diagnosis not present

## 2018-10-09 DIAGNOSIS — N139 Obstructive and reflux uropathy, unspecified: Secondary | ICD-10-CM | POA: Insufficient documentation

## 2018-10-09 DIAGNOSIS — R102 Pelvic and perineal pain: Secondary | ICD-10-CM | POA: Insufficient documentation

## 2018-10-09 DIAGNOSIS — D630 Anemia in neoplastic disease: Secondary | ICD-10-CM | POA: Diagnosis not present

## 2018-10-09 DIAGNOSIS — D6481 Anemia due to antineoplastic chemotherapy: Secondary | ICD-10-CM | POA: Insufficient documentation

## 2018-10-09 DIAGNOSIS — Z95828 Presence of other vascular implants and grafts: Secondary | ICD-10-CM

## 2018-10-09 LAB — CBC WITH DIFFERENTIAL (CANCER CENTER ONLY)
Abs Immature Granulocytes: 0.12 10*3/uL — ABNORMAL HIGH (ref 0.00–0.07)
BASOS ABS: 0.1 10*3/uL (ref 0.0–0.1)
BASOS PCT: 0 %
EOS ABS: 0.1 10*3/uL (ref 0.0–0.5)
Eosinophils Relative: 1 %
HCT: 31.7 % — ABNORMAL LOW (ref 39.0–52.0)
Hemoglobin: 9.9 g/dL — ABNORMAL LOW (ref 13.0–17.0)
Immature Granulocytes: 1 %
Lymphocytes Relative: 7 %
Lymphs Abs: 1.3 10*3/uL (ref 0.7–4.0)
MCH: 27.1 pg (ref 26.0–34.0)
MCHC: 31.2 g/dL (ref 30.0–36.0)
MCV: 86.8 fL (ref 80.0–100.0)
Monocytes Absolute: 1.6 10*3/uL — ABNORMAL HIGH (ref 0.1–1.0)
Monocytes Relative: 9 %
NEUTROS PCT: 82 %
Neutro Abs: 15.5 10*3/uL — ABNORMAL HIGH (ref 1.7–7.7)
Platelet Count: 527 10*3/uL — ABNORMAL HIGH (ref 150–400)
RBC: 3.65 MIL/uL — ABNORMAL LOW (ref 4.22–5.81)
RDW: 21.1 % — AB (ref 11.5–15.5)
WBC Count: 18.8 10*3/uL — ABNORMAL HIGH (ref 4.0–10.5)
nRBC: 0.1 % (ref 0.0–0.2)

## 2018-10-09 LAB — CMP (CANCER CENTER ONLY)
ALBUMIN: 2.7 g/dL — AB (ref 3.5–5.0)
ALT: 19 U/L (ref 0–44)
AST: 21 U/L (ref 15–41)
Alkaline Phosphatase: 94 U/L (ref 38–126)
Anion gap: 9 (ref 5–15)
BUN: 12 mg/dL (ref 6–20)
CALCIUM: 8.9 mg/dL (ref 8.9–10.3)
CO2: 29 mmol/L (ref 22–32)
Chloride: 105 mmol/L (ref 98–111)
Creatinine: 0.94 mg/dL (ref 0.61–1.24)
GFR, Est AFR Am: >60 mL/min (ref >60)
GFR, Estimated: >60 mL/min (ref >60)
Glucose, Bld: 137 mg/dL — ABNORMAL HIGH (ref 70–99)
Potassium: 3.6 mmol/L (ref 3.5–5.1)
SODIUM: 143 mmol/L (ref 135–145)
Total Bilirubin: 0.3 mg/dL (ref 0.3–1.2)
Total Protein: 7.1 g/dL (ref 6.5–8.1)

## 2018-10-09 MED ORDER — OXYCODONE-ACETAMINOPHEN 5-325 MG PO TABS
1.0000 | ORAL_TABLET | ORAL | Status: AC
  Administered 2018-10-09: 1 via ORAL

## 2018-10-09 MED ORDER — SODIUM CHLORIDE 0.9% FLUSH
10.0000 mL | INTRAVENOUS | Status: DC | PRN
  Administered 2018-10-09: 10 mL
  Filled 2018-10-09: qty 10

## 2018-10-09 MED ORDER — OXYCODONE-ACETAMINOPHEN 5-325 MG PO TABS
ORAL_TABLET | ORAL | Status: AC
  Filled 2018-10-09: qty 1

## 2018-10-09 MED ORDER — SODIUM CHLORIDE 0.9 % IV SOLN
Freq: Once | INTRAVENOUS | Status: AC
  Administered 2018-10-09: 12:00:00 via INTRAVENOUS
  Filled 2018-10-09: qty 250

## 2018-10-09 MED ORDER — MORPHINE SULFATE ER 30 MG PO TBCR: 30.0000 mg | EXTENDED_RELEASE_TABLET | Freq: Two times a day (BID) | ORAL | 0 refills | Status: DC

## 2018-10-09 MED ORDER — SODIUM CHLORIDE 0.9 % IV SOLN
2000.0000 mg | Freq: Once | INTRAVENOUS | Status: AC
  Administered 2018-10-09: 2000 mg via INTRAVENOUS
  Filled 2018-10-09: qty 52.6

## 2018-10-09 MED ORDER — OXYCODONE HCL 5 MG PO TABS: 5.0000 mg | ORAL_TABLET | ORAL | 0 refills | Status: DC | PRN

## 2018-10-09 MED ORDER — SODIUM CHLORIDE 0.9% FLUSH
10.0000 mL | Freq: Once | INTRAVENOUS | Status: AC
  Administered 2018-10-09: 10 mL
  Filled 2018-10-09: qty 10

## 2018-10-09 MED ORDER — PROCHLORPERAZINE MALEATE 10 MG PO TABS
10.0000 mg | ORAL_TABLET | Freq: Once | ORAL | Status: AC
  Administered 2018-10-09: 10 mg via ORAL

## 2018-10-09 MED ORDER — HEPARIN SOD (PORK) LOCK FLUSH 100 UNIT/ML IV SOLN
500.0000 [IU] | Freq: Once | INTRAVENOUS | Status: AC | PRN
  Administered 2018-10-09: 500 [IU]
  Filled 2018-10-09: qty 5

## 2018-10-09 MED ORDER — PROCHLORPERAZINE MALEATE 10 MG PO TABS
ORAL_TABLET | ORAL | Status: AC
  Filled 2018-10-09: qty 1

## 2018-10-09 NOTE — Telephone Encounter (Signed)
Scheduled appt per 02/03 los.  Printed calendar and avs per patient request.

## 2018-10-09 NOTE — Progress Notes (Signed)
Abdomino-pelvic pain 7/10. Last oxycodone was last night at home.

## 2018-10-09 NOTE — Patient Instructions (Addendum)
[facility] Discharge Instructions for Patients Receiving Chemotherapy  Today you received the following chemotherapy agents :  Gemcitabine,    To help prevent nausea and vomiting after your treatment, we encourage you to take your nausea medication as prescribed.   If you develop nausea and vomiting that is not controlled by your nausea medication, call the clinic.   BELOW ARE SYMPTOMS THAT SHOULD BE REPORTED IMMEDIATELY:  *FEVER GREATER THAN 100.5 F  *CHILLS WITH OR WITHOUT FEVER  NAUSEA AND VOMITING THAT IS NOT CONTROLLED WITH YOUR NAUSEA MEDICATION  *UNUSUAL SHORTNESS OF BREATH  *UNUSUAL BRUISING OR BLEEDING  TENDERNESS IN MOUTH AND THROAT WITH OR WITHOUT PRESENCE OF ULCERS  *URINARY PROBLEMS  *BOWEL PROBLEMS  UNUSUAL RASH Items with * indicate a potential emergency and should be followed up as soon as possible.  Feel free to call the clinic should you have any questions or concerns. The clinic phone number is [phone].  Please show the [facility] at check-in to the Emergency Department and triage nurse.  f Gemcitabine injection (Gemzar) What is this medicine? GEMCITABINE (jem SYE ta been) is a chemotherapy drug. This medicine is used to treat many types of cancer like breast cancer, lung cancer, pancreatic cancer, and ovarian cancer. This medicine may be used for other purposes; ask your health care provider or pharmacist if you have questions. COMMON BRAND NAME(S): Gemzar, Infugem What should I tell my health care provider before I take this medicine? They need to know if you have any of these conditions: -blood disorders -infection -kidney disease -liver disease -lung or breathing disease, like asthma -recent or ongoing radiation therapy -an unusual or allergic reaction to gemcitabine, other chemotherapy, other medicines, foods, dyes, or preservatives -pregnant or trying to get pregnant -breast-feeding How should I use this  medicine? This drug is given as an infusion into a vein. It is administered in a hospital or clinic by a specially trained health care professional. Talk to your pediatrician regarding the use of this medicine in children. Special care may be needed. Overdosage: If you think you have taken too much of this medicine contact a poison control center or emergency room at once. NOTE: This medicine is only for you. Do not share this medicine with others. What if I miss a dose? It is important not to miss your dose. Call your doctor or health care professional if you are unable to keep an appointment. What may interact with this medicine? -medicines to increase blood counts like filgrastim, pegfilgrastim, sargramostim -some other chemotherapy drugs like cisplatin -vaccines Talk to your doctor or health care professional before taking any of these medicines: -acetaminophen -aspirin -ibuprofen -ketoprofen -naproxen This list may not describe all possible interactions. Give your health care provider a list of all the medicines, herbs, non-prescription drugs, or dietary supplements you use. Also tell them if you smoke, drink alcohol, or use illegal drugs. Some items may interact with your medicine. What should I watch for while using this medicine? Visit your doctor for checks on your progress. This drug may make you feel generally unwell. This is not uncommon, as chemotherapy can affect healthy cells as well as cancer cells. Report any side effects. Continue your course of treatment even though you feel ill unless your doctor tells you to stop. In some cases, you may be given additional medicines to help with side effects. Follow all directions for their use. Call your doctor or health care professional for advice if you get  a fever, chills or sore throat, or other symptoms of a cold or flu. Do not treat yourself. This drug decreases your body's ability to fight infections. Try to avoid being around  people who are sick. This medicine may increase your risk to bruise or bleed. Call your doctor or health care professional if you notice any unusual bleeding. Be careful brushing and flossing your teeth or using a toothpick because you may get an infection or bleed more easily. If you have any dental work done, tell your dentist you are receiving this medicine. Avoid taking products that contain aspirin, acetaminophen, ibuprofen, naproxen, or ketoprofen unless instructed by your doctor. These medicines may hide a fever. Do not become pregnant while taking this medicine or for 6 months after stopping it. Women should inform their doctor if they wish to become pregnant or think they might be pregnant. Men should not father a child while taking this medicine and for 3 months after stopping it. There is a potential for serious side effects to an unborn child. Talk to your health care professional or pharmacist for more information. Do not breast-feed an infant while taking this medicine or for at least 1 week after stopping it. Men should inform their doctors if they wish to father a child. This medicine may lower sperm counts. Talk with your doctor or health care professional if you are concerned about your fertility. What side effects may I notice from receiving this medicine? Side effects that you should report to your doctor or health care professional as soon as possible: -allergic reactions like skin rash, itching or hives, swelling of the face, lips, or tongue -breathing problems -pain, redness, or irritation at site where injected -signs and symptoms of a dangerous change in heartbeat or heart rhythm like chest pain; dizziness; fast or irregular heartbeat; palpitations; feeling faint or lightheaded, falls; breathing problems -signs of decreased platelets or bleeding - bruising, pinpoint red spots on the skin, black, tarry stools, blood in the urine -signs of decreased red blood cells - unusually  weak or tired, feeling faint or lightheaded, falls -signs of infection - fever or chills, cough, sore throat, pain or difficulty passing urine -signs and symptoms of kidney injury like trouble passing urine or change in the amount of urine -signs and symptoms of liver injury like dark yellow or brown urine; general ill feeling or flu-like symptoms; light-colored stools; loss of appetite; nausea; right upper belly pain; unusually weak or tired; yellowing of the eyes or skin -swelling of ankles, feet, hands Side effects that usually do not require medical attention (report to your doctor or health care professional if they continue or are bothersome): -constipation -diarrhea -hair loss -loss of appetite -nausea -rash -vomiting This list may not describe all possible side effects. Call your doctor for medical advice about side effects. You may report side effects to FDA at 1-800-FDA-1088. Where should I keep my medicine? This drug is given in a hospital or clinic and will not be stored at home. NOTE: This sheet is a summary. It may not cover all possible information. If you have questions about this medicine, talk to your doctor, pharmacist, or health care provider.  2019 Elsevier/Gold Standard (2017-11-16 18:06:11)

## 2018-10-09 NOTE — Progress Notes (Signed)
Hematology and Oncology Follow Up Visit  Dylan Gonzalez [id number] [birth year] 53 y.o. 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   Prior Therapy: He is S/P transrectal prostate ultrasound and a prostate biopsy as well as a cystoscopy with insertion of double-J stent and transurethral resection of the prostate completed by Dr. Jeffie Pollock on April 13, 2018.  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  Immunohistochemical stains showed positive for desmin, smooth muscle actin, muscle specific actin and negative for PSA.  He was also negative for cytokeratin 7, 903, CD117 and CD34.     Current therapy: Taxotere with gemcitabine started on 05/30/2018.  He is status post 5 cycles of therapy.  Interim History: Dylan Gonzalez presents today for a follow-up.  Since last visit, he was hospitalized between September 28, 2018 and January 25 after presenting with abdominal pain and presumed sepsis.  He was treated with intravenous antibiotics and subsequently discharged on Cipro.  Since his discharge, he reports no major changes in his health.  He does report a low-grade fever but no chills or sweats.  He continues to have chronic abdominal pain and has been requiring oxycodone 2 tablets every 4 hours continuously.  His pain is graded 8 out of 10 at worst with improvement slightly with oxycodone.  He denies any nausea or vomiting but does report anorexia.  His performance status remains adequate however.  Patient denied any alteration mental status, neuropathy, confusion or dizziness.  Denies any headaches or lethargy.  Denies any night sweats, weight loss.  Denied orthopnea, dyspnea on exertion or chest discomfort.  Denies shortness of breath, difficulty breathing hemoptysis or cough.   Denies any abdominal distention, nausea, early satiety or dyspepsia.  Denies any hematuria, frequency, dysuria or nocturia.  Denies any skin irritation, dryness or rash.  Denies any ecchymosis or petechiae.  Denies any lymphadenopathy or clotting.  Denies any heat or cold intolerance.  Denies any anxiety or depression.  Remaining review of system is negative.      Medications: I have reviewed the patient's current medications.  Current Outpatient Medications  Medication Sig Dispense Refill  . albuterol (PROVENTIL HFA) 108 (90 Base) MCG/ACT inhaler Inhale 1-2 puffs into the lungs every 6 (six) hours as needed for wheezing or shortness of breath.    . ALPRAZolam (XANAX) 1 MG tablet Take 1 mg by mouth daily as needed for anxiety.    . bisacodyl (DULCOLAX) 10 MG suppository Place 1 suppository (10 mg total) rectally daily as needed for moderate constipation. 12 suppository 0  . ciprofloxacin (CIPRO) 500 MG tablet Take 1 tablet (500 mg total) by mouth 2 (two) times daily. 10 tablet 0  . Docusate Calcium (STOOL SOFTENER PO) Take 1 tablet by mouth daily as needed (constipation).     Marland Kitchen escitalopram (LEXAPRO) 10 MG tablet Take 10 mg by mouth daily.    . fluticasone (FLONASE) 50 MCG/ACT nasal spray PLACE 2 SPRAYS INTO BOTH NOSTRILS DAILY. (Patient taking differently: Place 2 sprays into both nostrils daily as needed for allergies. ) 16 g 9  . hydrochlorothiazide (HYDRODIURIL) 50 MG tablet Take 50 mg by mouth daily.    . hydrOXYzine (VISTARIL) 100 MG capsule TAKE ONE CAPSULE BY MOUTH AT BEDTIME AS NEEDED FOR ALLERGY (Patient not taking: Reported on 09/28/2018) 30 capsule 5  .  lidocaine-prilocaine (EMLA) cream Apply 1 application topically as needed. (Patient taking differently: Apply 1 application topically as needed (port access). ) 30 g 0  . metoprolol succinate (TOPROL-XL) 50 MG 24 hr tablet Take 50 mg by mouth daily.  1  . ondansetron (ZOFRAN) 8 MG tablet Take 1 tablet (8 mg total) by mouth every 8  (eight) hours as needed for nausea or vomiting. 20 tablet 0  . oxyCODONE (OXY IR/ROXICODONE) 5 MG immediate release tablet Take 1-2 tablets (5-10 mg total) by mouth every 4 (four) hours as needed for severe pain. 90 tablet 0  . polyethylene glycol (MIRALAX / GLYCOLAX) packet Take 17 g by mouth 2 (two) times daily as needed for mild constipation or moderate constipation. 14 each 0   No current facility-administered medications for this visit.      Allergies:  Allergies  Allergen Reactions  . Pollen Extract Other (See Comments)    Sneezing and shortness of breath    Past Medical History, Surgical history, Social history, and Family History were reviewed and updated.    Physical Exam:   Blood pressure (!) 148/98, pulse (!) 106, temperature 98.1 F (36.7 C), temperature source Oral, resp. rate 18, height 5' 11"  (1.803 m), weight 208 lb 4.8 oz (94.5 kg), SpO2 98 %.     ECOG: 1    General appearance: Comfortable appearing without any discomfort Head: Normocephalic without any trauma Oropharynx: Mucous membranes are moist and pink without any thrush or ulcers. Eyes: Pupils are equal and round reactive to light. Lymph nodes: No cervical, supraclavicular, inguinal or axillary lymphadenopathy.   Heart:regular rate and rhythm.  S1 and S2 without leg edema. Lung: Clear without any rhonchi or wheezes.  No dullness to percussion. Abdomin: Soft, nontender, nondistended with good bowel sounds.  No hepatosplenomegaly. Musculoskeletal: No joint deformity or effusion.  Full range of motion noted. Neurological: No deficits noted on motor, sensory and deep tendon reflex exam. Skin: No petechial rash or dryness.  Appeared moist.            Lab Results: Lab Results  Component Value Date   WBC 13.2 (H) 09/30/2018   HGB 9.4 (L) 09/30/2018   HCT 31.4 (L) 09/30/2018   MCV 88.2 09/30/2018   PLT 153 09/30/2018     Chemistry      Component Value Date/Time   NA 139 09/30/2018 0500    K 3.4 (L) 09/30/2018 0500   CL 103 09/30/2018 0500   CO2 27 09/30/2018 0500   BUN 11 09/30/2018 0500   CREATININE 0.86 09/30/2018 0500   CREATININE 0.90 09/25/2018 1050   CREATININE 1.07 06/01/2014 1257      Component Value Date/Time   CALCIUM 8.3 (L) 09/30/2018 0500   ALKPHOS 60 09/28/2018 1436   AST 25 09/28/2018 1436   AST 17 09/25/2018 1050   ALT 20 09/28/2018 1436   ALT 15 09/25/2018 1050   BILITOT 1.0 09/28/2018 1436   BILITOT 0.3 09/25/2018 1050     EXAM: CT ABDOMEN AND PELVIS WITH CONTRAST  TECHNIQUE: Multidetector CT imaging of the abdomen and pelvis was performed using the standard protocol following bolus administration of intravenous contrast.  CONTRAST:  15m OMNIPAQUE IOHEXOL 300 MG/ML  SOLN  COMPARISON:  CT abdomen pelvis dated August 14, 2018.  FINDINGS: Lower chest: Unchanged 6 mm pulmonary nodule in the left lower lobe. New trace pericardial effusion.  Hepatobiliary: Unchanged 1.5 cm right hepatic simple cyst. Additional subcentimeter low-density lesion in the inferior right hepatic lobe remains too  small to characterize. No new focal liver abnormality. The gallbladder is decompressed. No biliary dilatation.  Pancreas: Unremarkable. No pancreatic ductal dilatation or surrounding inflammatory changes.  Spleen: Normal in size without focal abnormality.  Adrenals/Urinary Tract: The adrenal glands are unremarkable. No focal renal mass. No renal or ureteral calculi. Unchanged bilateral ureteral stents. No hydronephrosis. Unchanged anterior displacement of the bladder, which remains decompressed by Foley catheter.  Stomach/Bowel: Stomach is within normal limits. Appendix appears normal. No evidence of bowel wall thickening, distention, or inflammatory changes.  Vascular/Lymphatic: Interval placement of an infrarenal IVC filter. No significant vascular findings are present. No enlarged abdominal lymph nodes. Unchanged borderline  enlarged right external iliac lymph node measuring 10 mm in short axis.  Reproductive: Grossly unchanged necrotic central pelvic mass measuring 11.4 x 9.6 cm, previously 11.6 x 9.5 cm. This remains intimately associated with the posterior bladder wall.  Other: No free fluid or pneumoperitoneum. Unchanged mild presacral stranding.  Musculoskeletal: No acute or significant osseous findings.  IMPRESSION: 1.  No acute intra-abdominal process.  Normal appendix. 2. Grossly unchanged necrotic central pelvic mass, consistent with history of prostate leiomyosarcoma. The mass remains inseparable from the posterior wall of the bladder. 3. Unchanged left lower lobe pulmonary nodule and borderline right external iliac adenopathy. 4. New trace pericardial effusion.     Impression and Plan:  53 year old man with:  1.  High-grade leiomyosarcoma with pulmonary involvement arising from the prostate diagnosed in August 2019.  He has been receiving palliative chemotherapy utilizing Taxotere and gemcitabine with few complications.  Risks and benefits of continuing this therapy versus transitioning to hospice was discussed today.  He understands he has an incurable malignancy and the goal of therapy remains palliative at best.  CT scan obtained on September 28, 2018 was personally reviewed showed overall stable disease.  After discussion today, he is agreeable to continue with chemotherapy and continue with aggressive approach.  He understands eventually he will require hospice enrollment.  The plan is to complete 7 cycles of therapy and repeat imaging studies at that time.  Salvage therapy may be needed after that.   2.  IV access: Port-A-Cath has been in use without any issues.  I recommended keeping it for the time being.  3.  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  IVC filter remains in place.  4.  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  5.  Pelvic pain: He has been using breakthrough pain medication on a regular basis and I recommended proceeding with long-acting morphine.  Complication associated with this treatment including constipation and lethargy was reviewed.  He will start on 30 mg twice a day and use oxycodone for breakthrough.  6.  Lower extremity edema: Unchanged at this time.  Related to his malignancy and chemotherapy.  7.  Anemia: Related to malignancy and chemotherapy.  He received transfusion while he was hospitalized and hemoglobin is adequate at this time.  8.  Prognosis and goals of care: He has an incurable malignancy and any treatment is palliative.  The goal of therapy remains palliative although his performance status is adequate and desires aggressive therapy.  9.  Follow-up: In 1 week for day 8 of cycle 6 and in 3 weeks for cycle 7 of therapy.  25 minutes was spent with the patient face-to-face today.  More than 50% of time was dedicated to reviewing his disease status, treatment options, dealing complications related  therapy.    [name], MD 2/3/202010:07 AM

## 2018-10-10 ENCOUNTER — Ambulatory Visit: Payer: Self-pay

## 2018-10-13 NOTE — Patient Instructions (Addendum)
Dylan Gonzalez  Sep 28, 1965     Your procedure is scheduled on:  10-19-2018   Report to [facility] Main  Entrance,  Report to admitting at  8:30 AM    Call this number if you have problems the morning of surgery [phone]       Remember: Do not eat food or drink liquids :After Midnight.   BRUSH YOUR TEETH MORNING OF SURGERY AND RINSE YOUR MOUTH OUT, NO CHEWING GUM CANDY OR MINTS.     Take these medicines the morning of surgery with A SIP OF WATER:   Escitalopram (lexopro),  Metoprolol (toprol), MS Contin,  Oxycodone if needed                                   You may not have any metal on your body including hair pins and               piercings  Do not wear jewelry, make-up, lotions, powders or perfumes, deodorant                        Men may shave face and neck.       Do not bring valuables to the hospital. [place].  Contacts, dentures or bridgework may not be worn into surgery.  Leave suitcase in the car. After surgery it may be brought to your room.       Patients discharged the day of surgery will not be allowed to drive home. IF YOU ARE HAVING SURGERY AND GOING HOME THE SAME DAY, YOU MUST HAVE AN ADULT TO DRIVE YOU HOME AND BE WITH YOU FOR 24 HOURS. YOU MAY GO HOME BY TAXI OR UBER OR ORTHERWISE, BUT AN ADULT MUST ACCOMPANY YOU HOME AND STAY WITH YOU FOR 24 HOURS.    Name and phone number of your driver:  Father-- Thayer Jew # [phone]           _____________________________________________________________________             [facility] - Preparing for Surgery Before surgery, you can play an important role.  Because skin is not sterile, your skin needs to be as free of germs as possible.  You can reduce the number of germs on your skin by washing with CHG (chlorahexidine gluconate) soap before surgery.  CHG is an antiseptic cleaner which kills germs and bonds with the skin  to continue killing germs even after washing. Please DO NOT use if you have an allergy to CHG or antibacterial soaps.  If your skin becomes reddened/irritated stop using the CHG and inform your nurse when you arrive at Short Stay. Do not shave (including legs and underarms) for at least 48 hours prior to the first CHG shower.  You may shave your face/neck. Please follow these instructions carefully:  1.  Shower with CHG Soap the night before surgery and the  morning of Surgery.  2.  If you choose to wash your hair, wash your hair first as usual with your  normal  shampoo.  3.  After you shampoo, rinse your hair and body thoroughly to remove the  shampoo.  4.  Use CHG as you would any other liquid soap.  You can apply chg directly  to the skin and wash                       Gently with a scrungie or clean washcloth.  5.  Apply the CHG Soap to your body ONLY FROM THE NECK DOWN.   Do not use on face/ open                           Wound or open sores. Avoid contact with eyes, ears mouth and genitals (private parts).                       Wash face,  Genitals (private parts) with your normal soap.             6.  Wash thoroughly, paying special attention to the area where your surgery  will be performed.  7.  Thoroughly rinse your body with warm water from the neck down.  8.  DO NOT shower/wash with your normal soap after using and rinsing off  the CHG Soap.             9.  Pat yourself dry with a clean towel.            10.  Wear clean pajamas.            11.  Place clean sheets on your bed the night of your first shower and do not  sleep with pets. Day of Surgery : Do not apply any lotions/deodorants the morning of surgery.  Please wear clean clothes to the hospital/surgery center.  FAILURE TO FOLLOW THESE INSTRUCTIONS MAY RESULT IN THE CANCELLATION OF YOUR SURGERY PATIENT SIGNATURE_________________________________  NURSE  SIGNATURE__________________________________  ________________________________________________________________________

## 2018-10-16 ENCOUNTER — Encounter (HOSPITAL_COMMUNITY): Payer: Self-pay

## 2018-10-16 ENCOUNTER — Inpatient Hospital Stay: Payer: BLUE CROSS/BLUE SHIELD

## 2018-10-16 ENCOUNTER — Other Ambulatory Visit: Payer: Self-pay

## 2018-10-16 ENCOUNTER — Encounter (HOSPITAL_COMMUNITY)
Admission: RE | Admit: 2018-10-16 | Discharge: 2018-10-16 | Disposition: A | Discharge status: Home / Self Care | Payer: BLUE CROSS/BLUE SHIELD | Source: Ambulatory Visit | Attending: Urology | Admitting: Urology

## 2018-10-16 VITALS — BP 133/90 | HR 82 | Temp 98.0°F | Resp 16 | Wt 216.0 lb

## 2018-10-16 DIAGNOSIS — N139 Obstructive and reflux uropathy, unspecified: Secondary | ICD-10-CM | POA: Diagnosis not present

## 2018-10-16 DIAGNOSIS — C7800 Secondary malignant neoplasm of unspecified lung: Secondary | ICD-10-CM | POA: Diagnosis not present

## 2018-10-16 DIAGNOSIS — C494 Malignant neoplasm of connective and soft tissue of abdomen: Secondary | ICD-10-CM

## 2018-10-16 DIAGNOSIS — Z01812 Encounter for preprocedural laboratory examination: Secondary | ICD-10-CM | POA: Insufficient documentation

## 2018-10-16 DIAGNOSIS — Z5189 Encounter for other specified aftercare: Secondary | ICD-10-CM | POA: Diagnosis not present

## 2018-10-16 DIAGNOSIS — D630 Anemia in neoplastic disease: Secondary | ICD-10-CM | POA: Diagnosis not present

## 2018-10-16 DIAGNOSIS — R600 Localized edema: Secondary | ICD-10-CM | POA: Diagnosis not present

## 2018-10-16 DIAGNOSIS — Z86718 Personal history of other venous thrombosis and embolism: Secondary | ICD-10-CM | POA: Diagnosis not present

## 2018-10-16 DIAGNOSIS — R102 Pelvic and perineal pain: Secondary | ICD-10-CM | POA: Diagnosis not present

## 2018-10-16 DIAGNOSIS — C61 Malignant neoplasm of prostate: Secondary | ICD-10-CM | POA: Diagnosis not present

## 2018-10-16 DIAGNOSIS — D6481 Anemia due to antineoplastic chemotherapy: Secondary | ICD-10-CM | POA: Diagnosis not present

## 2018-10-16 DIAGNOSIS — Z5111 Encounter for antineoplastic chemotherapy: Secondary | ICD-10-CM | POA: Diagnosis not present

## 2018-10-16 DIAGNOSIS — Z95828 Presence of other vascular implants and grafts: Secondary | ICD-10-CM

## 2018-10-16 HISTORY — DX: Secondary malignant neoplasm of unspecified lung: C61

## 2018-10-16 HISTORY — DX: Other forms of dyspnea: R06.09

## 2018-10-16 HISTORY — DX: Localized edema: R60.0

## 2018-10-16 HISTORY — DX: Secondary malignant neoplasm of unspecified lung: C78.00

## 2018-10-16 HISTORY — DX: Presence of other vascular implants and grafts: Z95.828

## 2018-10-16 HISTORY — DX: Personal history of transient ischemic attack (TIA), and cerebral infarction without residual deficits: Z86.73

## 2018-10-16 HISTORY — DX: Personal history of other venous thrombosis and embolism: Z86.718

## 2018-10-16 HISTORY — DX: Personal history of other infectious and parasitic diseases: Z86.19

## 2018-10-16 HISTORY — DX: Retention of urine, unspecified: R33.9

## 2018-10-16 HISTORY — DX: Chronic kidney disease, stage 2 (mild): N18.2

## 2018-10-16 HISTORY — DX: Presence of other specified devices: Z97.8

## 2018-10-16 HISTORY — DX: Hematuria, unspecified: R31.9

## 2018-10-16 HISTORY — DX: Presence of urogenital implants: Z96.0

## 2018-10-16 HISTORY — DX: Dyspnea, unspecified: R06.00

## 2018-10-16 HISTORY — DX: Chronic diastolic (congestive) heart failure: I50.32

## 2018-10-16 LAB — CBC WITH DIFFERENTIAL (CANCER CENTER ONLY)
ABS IMMATURE GRANULOCYTES: 0.05 10*3/uL (ref 0.00–0.07)
Basophils Absolute: 0.1 10*3/uL (ref 0.0–0.1)
Basophils Relative: 1 %
Eosinophils Absolute: 0.1 10*3/uL (ref 0.0–0.5)
Eosinophils Relative: 1 %
HCT: 27.7 % — ABNORMAL LOW (ref 39.0–52.0)
Hemoglobin: 8.4 g/dL — ABNORMAL LOW (ref 13.0–17.0)
Immature Granulocytes: 1 %
Lymphocytes Relative: 10 %
Lymphs Abs: 1 10*3/uL (ref 0.7–4.0)
MCH: 26.5 pg (ref 26.0–34.0)
MCHC: 30.3 g/dL (ref 30.0–36.0)
MCV: 87.4 fL (ref 80.0–100.0)
Monocytes Absolute: 1.1 10*3/uL — ABNORMAL HIGH (ref 0.1–1.0)
Monocytes Relative: 12 %
NEUTROS ABS: 7.2 10*3/uL (ref 1.7–7.7)
NEUTROS PCT: 75 %
Platelet Count: 362 10*3/uL (ref 150–400)
RBC: 3.17 MIL/uL — ABNORMAL LOW (ref 4.22–5.81)
RDW: 20.5 % — ABNORMAL HIGH (ref 11.5–15.5)
WBC Count: 9.5 10*3/uL (ref 4.0–10.5)
nRBC: 0 % (ref 0.0–0.2)

## 2018-10-16 LAB — CMP (CANCER CENTER ONLY)
ALT: 12 U/L (ref 0–44)
AST: 19 U/L (ref 15–41)
Albumin: 2.4 g/dL — ABNORMAL LOW (ref 3.5–5.0)
Alkaline Phosphatase: 70 U/L (ref 38–126)
Anion gap: 9 (ref 5–15)
BUN: 12 mg/dL (ref 6–20)
CO2: 28 mmol/L (ref 22–32)
Calcium: 8.6 mg/dL — ABNORMAL LOW (ref 8.9–10.3)
Chloride: 104 mmol/L (ref 98–111)
Creatinine: 0.96 mg/dL (ref 0.61–1.24)
GFR, Est AFR Am: >60 mL/min (ref >60)
GFR, Estimated: >60 mL/min (ref >60)
Glucose, Bld: 113 mg/dL — ABNORMAL HIGH (ref 70–99)
POTASSIUM: 3.8 mmol/L (ref 3.5–5.1)
Sodium: 141 mmol/L (ref 135–145)
Total Bilirubin: 0.5 mg/dL (ref 0.3–1.2)
Total Protein: 6.4 g/dL — ABNORMAL LOW (ref 6.5–8.1)

## 2018-10-16 MED ORDER — HEPARIN SOD (PORK) LOCK FLUSH 100 UNIT/ML IV SOLN
500.0000 [IU] | Freq: Once | INTRAVENOUS | Status: AC | PRN
  Administered 2018-10-16: 500 [IU]
  Filled 2018-10-16: qty 5

## 2018-10-16 MED ORDER — SODIUM CHLORIDE 0.9 % IV SOLN
100.0000 mg/m2 | Freq: Once | INTRAVENOUS | Status: AC
  Administered 2018-10-16: 220 mg via INTRAVENOUS
  Filled 2018-10-16: qty 22

## 2018-10-16 MED ORDER — DEXAMETHASONE SODIUM PHOSPHATE 10 MG/ML IJ SOLN
INTRAMUSCULAR | Status: AC
  Filled 2018-10-16: qty 1

## 2018-10-16 MED ORDER — SODIUM CHLORIDE 0.9 % IV SOLN
Freq: Once | INTRAVENOUS | Status: AC
  Administered 2018-10-16: 11:00:00 via INTRAVENOUS
  Filled 2018-10-16: qty 250

## 2018-10-16 MED ORDER — SODIUM CHLORIDE 0.9% FLUSH
10.0000 mL | INTRAVENOUS | Status: DC | PRN
  Administered 2018-10-16: 10 mL
  Filled 2018-10-16: qty 10

## 2018-10-16 MED ORDER — SODIUM CHLORIDE 0.9 % IV SOLN
2000.0000 mg | Freq: Once | INTRAVENOUS | Status: AC
  Administered 2018-10-16: 2000 mg via INTRAVENOUS
  Filled 2018-10-16: qty 52.6

## 2018-10-16 MED ORDER — SODIUM CHLORIDE 0.9% FLUSH
10.0000 mL | Freq: Once | INTRAVENOUS | Status: AC
  Administered 2018-10-16: 10 mL
  Filled 2018-10-16: qty 10

## 2018-10-16 MED ORDER — DEXAMETHASONE SODIUM PHOSPHATE 10 MG/ML IJ SOLN
10.0000 mg | Freq: Once | INTRAMUSCULAR | Status: AC
  Administered 2018-10-16: 10 mg via INTRAVENOUS

## 2018-10-16 NOTE — Progress Notes (Addendum)
Pt surgical clearance from dr Alen Blew dated 09-29-2018 with chart.  Dr Christin Fudge note dated 10-09-2018 in epic.  EKG dated 08-13-2018 in epic CXR and Abd. CT dated 09-28-2018 in epic. ECHO dated 08-02-2018 in epic.  CBCdiff and CMP dated 10-09-2018 in epic. Pt is scheduled for lab work and chemo today.  Chart given to anesthesia for review,  Konrad Felix PA.

## 2018-10-16 NOTE — Progress Notes (Signed)
Pt stated in treatment room today he has been having ongoing hematuria for the last week.  Previous office note addresses ongoing hematuria. Pt stated no more blood present than usual.   Dr. Alen Blew made aware.  Ok to treat.

## 2018-10-16 NOTE — Patient Instructions (Signed)
[facility] Discharge Instructions for Patients Receiving Chemotherapy  Today you received the following chemotherapy agents :  Gemcitabine,  Taxotere.  To help prevent nausea and vomiting after your treatment, we encourage you to take your nausea medication as prescribed.   If you develop nausea and vomiting that is not controlled by your nausea medication, call the clinic.   BELOW ARE SYMPTOMS THAT SHOULD BE REPORTED IMMEDIATELY:  *FEVER GREATER THAN 100.5 F  *CHILLS WITH OR WITHOUT FEVER  NAUSEA AND VOMITING THAT IS NOT CONTROLLED WITH YOUR NAUSEA MEDICATION  *UNUSUAL SHORTNESS OF BREATH  *UNUSUAL BRUISING OR BLEEDING  TENDERNESS IN MOUTH AND THROAT WITH OR WITHOUT PRESENCE OF ULCERS  *URINARY PROBLEMS  *BOWEL PROBLEMS  UNUSUAL RASH Items with * indicate a potential emergency and should be followed up as soon as possible.  Feel free to call the clinic should you have any questions or concerns. The clinic phone number is [phone].  Please show the [facility] at check-in to the Emergency Department and triage nurse.  Docetaxel injection (taxotere) What is this medicine? DOCETAXEL (doe se TAX el) is a chemotherapy drug. It targets fast dividing cells, like cancer cells, and causes these cells to die. This medicine is used to treat many types of cancers like breast cancer, certain stomach cancers, head and neck cancer, lung cancer, and prostate cancer. This medicine may be used for other purposes; ask your health care provider or pharmacist if you have questions. COMMON BRAND NAME(S): Docefrez, Taxotere What should I tell my health care provider before I take this medicine? They need to know if you have any of these conditions: -infection (especially a virus infection such as chickenpox, cold sores, or herpes) -liver disease -low blood counts, like low white cell, platelet, or red cell counts -an unusual or allergic reaction to docetaxel,  polysorbate 80, other chemotherapy agents, other medicines, foods, dyes, or preservatives -pregnant or trying to get pregnant -breast-feeding How should I use this medicine? This drug is given as an infusion into a vein. It is administered in a hospital or clinic by a specially trained health care professional. Talk to your pediatrician regarding the use of this medicine in children. Special care may be needed. Overdosage: If you think you have taken too much of this medicine contact a poison control center or emergency room at once. NOTE: This medicine is only for you. Do not share this medicine with others. What if I miss a dose? It is important not to miss your dose. Call your doctor or health care professional if you are unable to keep an appointment. What may interact with this medicine? -cyclosporine -erythromycin -ketoconazole -medicines to increase blood counts like filgrastim, pegfilgrastim, sargramostim -vaccines Talk to your doctor or health care professional before taking any of these medicines: -acetaminophen -aspirin -ibuprofen -ketoprofen -naproxen This list may not describe all possible interactions. Give your health care provider a list of all the medicines, herbs, non-prescription drugs, or dietary supplements you use. Also tell them if you smoke, drink alcohol, or use illegal drugs. Some items may interact with your medicine. What should I watch for while using this medicine? Your condition will be monitored carefully while you are receiving this medicine. You will need important blood work done while you are taking this medicine. This drug may make you feel generally unwell. This is not uncommon, as chemotherapy can affect healthy cells as well as cancer cells. Report any side effects. Continue your course of treatment even  though you feel ill unless your doctor tells you to stop. In some cases, you may be given additional medicines to help with side effects. Follow all  directions for their use. Call your doctor or health care professional for advice if you get a fever, chills or sore throat, or other symptoms of a cold or flu. Do not treat yourself. This drug decreases your body's ability to fight infections. Try to avoid being around people who are sick. This medicine may increase your risk to bruise or bleed. Call your doctor or health care professional if you notice any unusual bleeding. This medicine may contain alcohol in the product. You may get drowsy or dizzy. Do not drive, use machinery, or do anything that needs mental alertness until you know how this medicine affects you. Do not stand or sit up quickly, especially if you are an older patient. This reduces the risk of dizzy or fainting spells. Avoid alcoholic drinks. Do not become pregnant while taking this medicine or for 6 months after stopping it. Women should inform their doctor if they wish to become pregnant or think they might be pregnant. Men should not father a child while taking this medicine and for 3 months after stopping it. There is a potential for serious side effects to an unborn child. Talk to your health care professional or pharmacist for more information. Do not breast-feed an infant while taking this medicine or for 2 weeks after stopping it. This may interfere with the ability to father a child. You should talk to your doctor or health care professional if you are concerned about your fertility. What side effects may I notice from receiving this medicine? Side effects that you should report to your doctor or health care professional as soon as possible: -allergic reactions like skin rash, itching or hives, swelling of the face, lips, or tongue -low blood counts - This drug may decrease the number of white blood cells, red blood cells and platelets. You may be at increased risk for infections and bleeding. -signs of infection - fever or chills, cough, sore throat, pain or difficulty  passing urine -signs of decreased platelets or bleeding - bruising, pinpoint red spots on the skin, black, tarry stools, nosebleeds -signs of decreased red blood cells - unusually weak or tired, fainting spells, lightheadedness -breathing problems -fast or irregular heartbeat -low blood pressure -mouth sores -nausea and vomiting -pain, swelling, redness or irritation at the injection site -pain, tingling, numbness in the hands or feet -swelling of the ankle, feet, hands -weight gain Side effects that usually do not require medical attention (report to your doctor or health care professional if they continue or are bothersome): -bone pain -complete hair loss including hair on your head, underarms, pubic hair, eyebrows, and eyelashes -diarrhea -excessive tearing -changes in the color of fingernails -loosening of the fingernails -nausea -muscle pain -red flush to skin -sweating -weak or tired This list may not describe all possible side effects. Call your doctor for medical advice about side effects. You may report side effects to FDA at 1-800-FDA-1088. Where should I keep my medicine? This drug is given in a hospital or clinic and will not be stored at home. NOTE: This sheet is a summary. It may not cover all possible information. If you have questions about this medicine, talk to your doctor, pharmacist, or health care provider.  2019 Elsevier/Gold Standard (2017-09-19 12:07:21)  Gemcitabine injection (Gemzar) What is this medicine? GEMCITABINE (jem SYE ta been) is a chemotherapy drug.  This medicine is used to treat many types of cancer like breast cancer, lung cancer, pancreatic cancer, and ovarian cancer. This medicine may be used for other purposes; ask your health care provider or pharmacist if you have questions. COMMON BRAND NAME(S): Gemzar, Infugem What should I tell my health care provider before I take this medicine? They need to know if you have any of these  conditions: -blood disorders -infection -kidney disease -liver disease -lung or breathing disease, like asthma -recent or ongoing radiation therapy -an unusual or allergic reaction to gemcitabine, other chemotherapy, other medicines, foods, dyes, or preservatives -pregnant or trying to get pregnant -breast-feeding How should I use this medicine? This drug is given as an infusion into a vein. It is administered in a hospital or clinic by a specially trained health care professional. Talk to your pediatrician regarding the use of this medicine in children. Special care may be needed. Overdosage: If you think you have taken too much of this medicine contact a poison control center or emergency room at once. NOTE: This medicine is only for you. Do not share this medicine with others. What if I miss a dose? It is important not to miss your dose. Call your doctor or health care professional if you are unable to keep an appointment. What may interact with this medicine? -medicines to increase blood counts like filgrastim, pegfilgrastim, sargramostim -some other chemotherapy drugs like cisplatin -vaccines Talk to your doctor or health care professional before taking any of these medicines: -acetaminophen -aspirin -ibuprofen -ketoprofen -naproxen This list may not describe all possible interactions. Give your health care provider a list of all the medicines, herbs, non-prescription drugs, or dietary supplements you use. Also tell them if you smoke, drink alcohol, or use illegal drugs. Some items may interact with your medicine. What should I watch for while using this medicine? Visit your doctor for checks on your progress. This drug may make you feel generally unwell. This is not uncommon, as chemotherapy can affect healthy cells as well as cancer cells. Report any side effects. Continue your course of treatment even though you feel ill unless your doctor tells you to stop. In some cases, you  may be given additional medicines to help with side effects. Follow all directions for their use. Call your doctor or health care professional for advice if you get a fever, chills or sore throat, or other symptoms of a cold or flu. Do not treat yourself. This drug decreases your body's ability to fight infections. Try to avoid being around people who are sick. This medicine may increase your risk to bruise or bleed. Call your doctor or health care professional if you notice any unusual bleeding. Be careful brushing and flossing your teeth or using a toothpick because you may get an infection or bleed more easily. If you have any dental work done, tell your dentist you are receiving this medicine. Avoid taking products that contain aspirin, acetaminophen, ibuprofen, naproxen, or ketoprofen unless instructed by your doctor. These medicines may hide a fever. Do not become pregnant while taking this medicine or for 6 months after stopping it. Women should inform their doctor if they wish to become pregnant or think they might be pregnant. Men should not father a child while taking this medicine and for 3 months after stopping it. There is a potential for serious side effects to an unborn child. Talk to your health care professional or pharmacist for more information. Do not breast-feed an infant while taking this  medicine or for at least 1 week after stopping it. Men should inform their doctors if they wish to father a child. This medicine may lower sperm counts. Talk with your doctor or health care professional if you are concerned about your fertility. What side effects may I notice from receiving this medicine? Side effects that you should report to your doctor or health care professional as soon as possible: -allergic reactions like skin rash, itching or hives, swelling of the face, lips, or tongue -breathing problems -pain, redness, or irritation at site where injected -signs and symptoms of a  dangerous change in heartbeat or heart rhythm like chest pain; dizziness; fast or irregular heartbeat; palpitations; feeling faint or lightheaded, falls; breathing problems -signs of decreased platelets or bleeding - bruising, pinpoint red spots on the skin, black, tarry stools, blood in the urine -signs of decreased red blood cells - unusually weak or tired, feeling faint or lightheaded, falls -signs of infection - fever or chills, cough, sore throat, pain or difficulty passing urine -signs and symptoms of kidney injury like trouble passing urine or change in the amount of urine -signs and symptoms of liver injury like dark yellow or brown urine; general ill feeling or flu-like symptoms; light-colored stools; loss of appetite; nausea; right upper belly pain; unusually weak or tired; yellowing of the eyes or skin -swelling of ankles, feet, hands Side effects that usually do not require medical attention (report to your doctor or health care professional if they continue or are bothersome): -constipation -diarrhea -hair loss -loss of appetite -nausea -rash -vomiting This list may not describe all possible side effects. Call your doctor for medical advice about side effects. You may report side effects to FDA at 1-800-FDA-1088. Where should I keep my medicine? This drug is given in a hospital or clinic and will not be stored at home. NOTE: This sheet is a summary. It may not cover all possible information. If you have questions about this medicine, talk to your doctor, pharmacist, or health care provider.  2019 Elsevier/Gold Standard (2017-11-16 18:06:11)

## 2018-10-17 NOTE — Progress Notes (Signed)
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 Irine Seal.   Clearance received from oncologist, Dr. [name], on 09/18/2018 (on chart) which states, "Per Dr. Alen Blew patient is cleared for cystoscopy and bilateral stent change on 10/19/18 by Dr. Jeffie Pollock."  Hemoglobin 8.4 10/16/18, reviewed by Dr. Alen Blew.  Will repeat CBC DOS.  VS: BP (!) 140/93 (BP Location: Right Arm)   Pulse (!) 105   Temp 36.4 C (Oral)   Resp 18   Ht 5\' 11"  (1.803 m)   SpO2 97%   BMI 29.05 kg/m   PROVIDERS: Donald Prose, MD is PCP   [name], MD is Oncologist  LABS: Labs reviewed: Acceptable for surgery. (all labs ordered are listed, but only abnormal results are displayed)  Labs Reviewed - No data to display 10/16/2018 Hemoglobin 8.4    IMAGES: CT Abdomen Pelvis 09/28/2018 IMPRESSION: 1.  No acute intra-abdominal process.  Normal appendix. 2. Grossly unchanged necrotic central pelvic mass, consistent with history of prostate leiomyosarcoma. The mass remains inseparable from the posterior wall of the bladder. 3. Unchanged left lower lobe pulmonary nodule and borderline right external iliac adenopathy. 4. New trace pericardial effusion.  EKG: 08/13/2018 Rate 101 bpm Sinus tachycardia   CV: Echo 08/02/18 Study Conclusions  - Left ventricle: The cavity size was normal. Wall thickness  was   increased in a pattern of mild LVH. Systolic function was normal.   The estimated ejection fraction was in the range of 50% to 55%.   Wall motion was normal; there were no regional wall motion   abnormalities. Features are consistent with a pseudonormal left   ventricular filling pattern, with concomitant abnormal relaxation   and increased filling pressure (grade 2 diastolic dysfunction). - Aortic valve: There was no regurgitation. - Mitral valve: There was no significant regurgitation. - Left atrium: The atrium was moderately dilated. - Right ventricle: Systolic function was normal. - Atrial septum: No defect or patent foramen ovale was identified. - Tricuspid valve: There was trivial regurgitation. - Pulmonic valve: There was no significant regurgitation. - Pulmonary arteries: Systolic pressure was within the normal   range.  Impressions:  - Normal LV systolic function with grade 2 diastolic dysfunction. Past Medical History:  Diagnosis Date  . Bilateral lower extremity edema   . Chronic diastolic congestive heart failure ([place]) 07/2018  . CKD (chronic kidney disease), stage II   . DOE (dyspnea on exertion)   . Foley catheter in place   . Hematuria   . History of CVA (cerebrovascular accident) 2007   per pt no residuals  . History of DVT of lower extremity 06/2018   left common femoral vein,  started on anticoagulant , stopped due to hematuria,  IVC filter placed 08-16-2018  . History of sepsis 09/28/2018   uti  . Hypertension   . Malignant neoplasm metastatic to lung ([place]) 04/19/2018  . Port-A-Cath in  place 06/21/2018  . 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 S/P IVC filter 08/16/2018  . Urinary retention 09/28/2018   CHRONIC FOLEY CATH     Past Surgical History:  Procedure Laterality Date  . CYSTOSCOPY W/ URETERAL STENT PLACEMENT Bilateral 04/13/2018   Procedure: CYSTOSCOPY WITH BILATERAL STENT REPLACEMENT;  Surgeon: Irine Seal, MD;  Location: [facility];  Service: Urology;  Laterality: Bilateral;  . ENUCLEATION Left 1976   removal left eye due to Retinoplastoma  . INTRAOCULAR PROSTHESES INSERTION    . IR IMAGING GUIDED PORT INSERTION  06/21/2018  . IR IVC FILTER PLMT / S&I /IMG GUID/MOD SED  08/16/2018  . LAPAROSCOPIC INGUINAL HERNIA REPAIR Right 11-19-2010   dr d. Ninfa Linden  @[facility]    AND UMBILICAL HERNIA REPAIR  . PROSTATE BIOPSY [name] 04/13/2018   Procedure: ULTRASOUND GUIDED PROSTATE BIOPSY;  Surgeon: Irine Seal, MD;  Location: [facility];  Service: Urology;  Laterality: [name];  . TOOTH EXTRACTION Right 04/21/2013   Procedure: EXTRACTION MOLARS;  Surgeon: [name], DDS;  Location: [facility];  Service: Oral Surgery;  Laterality: Right;  . TRANSURETHRAL RESECTION OF PROSTATE  04/13/2018   Procedure: TRANSURETHRAL RESECTION OF THE PROSTATE (TURP);  Surgeon: Irine Seal, MD;  Location: [facility];  Service: Urology;;    MEDICATIONS: . albuterol (PROVENTIL HFA) 108 (90 Base) MCG/ACT inhaler  . ALPRAZolam (XANAX) 1 MG tablet  . bisacodyl (DULCOLAX) 10 MG suppository  . ciprofloxacin (CIPRO) 500 MG tablet  . Docusate Calcium (STOOL SOFTENER PO)  . escitalopram (LEXAPRO) 10 MG tablet  . fluticasone (FLONASE) 50 MCG/ACT nasal spray  . hydrochlorothiazide (HYDRODIURIL) 50 MG tablet  . hydrOXYzine (VISTARIL) 100 MG capsule  . lidocaine-prilocaine (EMLA) cream  . metoprolol succinate (TOPROL-XL) 50 MG 24 hr tablet  . morphine (MS CONTIN) 30 MG 12 hr tablet  . ondansetron (ZOFRAN) 8 MG tablet  . oxyCODONE (OXY IR/ROXICODONE) 5 MG immediate release tablet  . polyethylene glycol (MIRALAX / GLYCOLAX) packet   No current facility-administered medications for this encounter.      Maia Plan [facility] Pre-Surgical Testing [phone] 10/17/18 11:24 AM

## 2018-10-17 NOTE — Anesthesia Preprocedure Evaluation (Addendum)
Anesthesia Evaluation  Patient identified by MRN, date of birth, ID band Patient awake    Reviewed: Allergy & Precautions, NPO status , Patient's Chart, lab work & pertinent test results, reviewed documented beta blocker date and time   Airway Mallampati: II  TM Distance: >3 FB Neck ROM: Full    Dental  (+) Dental Advisory Given   Pulmonary neg pulmonary ROS,    Pulmonary exam normal breath sounds clear to auscultation       Cardiovascular hypertension, Pt. on medications and Pt. on home beta blockers + CAD, +CHF and + DOE  Normal cardiovascular exam Rhythm:Regular Rate:Normal     Neuro/Psych negative neurological ROS  negative psych ROS   GI/Hepatic negative GI ROS, Neg liver ROS,   Endo/Other  negative endocrine ROS  Renal/GU Renal disease     Musculoskeletal negative musculoskeletal ROS (+)   Abdominal   Peds  Hematology  (+) Blood dyscrasia, anemia ,   Anesthesia Other Findings   Reproductive/Obstetrics                                                             Anesthesia Evaluation  Patient identified by MRN, date of birth, ID band Patient awake    Reviewed: Allergy & Precautions, NPO status , Patient's Chart, lab work & pertinent test results  Airway Mallampati: II  TM Distance: >3 FB     Dental   Pulmonary neg pulmonary ROS,    breath sounds clear to auscultation       Cardiovascular hypertension, + Past MI   Rhythm:Regular Rate:Normal     Neuro/Psych    GI/Hepatic negative GI ROS, Neg liver ROS,   Endo/Other  negative endocrine ROS  Renal/GU negative Renal ROS     Musculoskeletal   Abdominal   Peds  Hematology   Anesthesia Other Findings   Reproductive/Obstetrics                             Anesthesia Physical Anesthesia Plan  ASA: III  Anesthesia Plan: General   Post-op Pain Management:    Induction:  Intravenous  PONV Risk Score and Plan: 2 and Ondansetron, Dexamethasone and Midazolam  Airway Management Planned: LMA  Additional Equipment:   Intra-op Plan:   Post-operative Plan: Extubation in OR  Informed Consent: I have reviewed the patients History and Physical, chart, labs and discussed the procedure including the risks, benefits and alternatives for the proposed anesthesia with the patient or authorized representative who has indicated his/her understanding and acceptance.   Dental advisory given  Plan Discussed with: CRNA and Anesthesiologist  Anesthesia Plan Comments:        Anesthesia Quick Evaluation  Anesthesia Physical Anesthesia Plan  ASA: III  Anesthesia Plan: General   Post-op Pain Management:    Induction: Intravenous  PONV Risk Score and Plan: 3 and Ondansetron, Dexamethasone and Midazolam  Airway Management Planned: LMA  Additional Equipment: None  Intra-op Plan:   Post-operative Plan: Extubation in OR  Informed Consent: I have reviewed the patients History and Physical, chart, labs and discussed the procedure including the risks, benefits and alternatives for the proposed anesthesia with the patient or authorized representative who has indicated his/her understanding and acceptance.     Dental advisory given  Plan Discussed with: CRNA  Anesthesia Plan Comments: (See PST note 10/16/18, Konrad Felix, PA-C)       Anesthesia Quick Evaluation

## 2018-10-18 ENCOUNTER — Inpatient Hospital Stay: Payer: BLUE CROSS/BLUE SHIELD

## 2018-10-18 DIAGNOSIS — Z5189 Encounter for other specified aftercare: Secondary | ICD-10-CM | POA: Diagnosis not present

## 2018-10-18 DIAGNOSIS — D630 Anemia in neoplastic disease: Secondary | ICD-10-CM | POA: Diagnosis not present

## 2018-10-18 DIAGNOSIS — R600 Localized edema: Secondary | ICD-10-CM | POA: Diagnosis not present

## 2018-10-18 DIAGNOSIS — C61 Malignant neoplasm of prostate: Secondary | ICD-10-CM | POA: Diagnosis not present

## 2018-10-18 DIAGNOSIS — C7800 Secondary malignant neoplasm of unspecified lung: Secondary | ICD-10-CM | POA: Diagnosis not present

## 2018-10-18 DIAGNOSIS — N139 Obstructive and reflux uropathy, unspecified: Secondary | ICD-10-CM | POA: Diagnosis not present

## 2018-10-18 DIAGNOSIS — Z5111 Encounter for antineoplastic chemotherapy: Secondary | ICD-10-CM | POA: Diagnosis not present

## 2018-10-18 DIAGNOSIS — Z86718 Personal history of other venous thrombosis and embolism: Secondary | ICD-10-CM | POA: Diagnosis not present

## 2018-10-18 DIAGNOSIS — R102 Pelvic and perineal pain: Secondary | ICD-10-CM | POA: Diagnosis not present

## 2018-10-18 DIAGNOSIS — D6481 Anemia due to antineoplastic chemotherapy: Secondary | ICD-10-CM | POA: Diagnosis not present

## 2018-10-18 DIAGNOSIS — C494 Malignant neoplasm of connective and soft tissue of abdomen: Secondary | ICD-10-CM

## 2018-10-18 MED ORDER — PEGFILGRASTIM-CBQV 6 MG/0.6ML ~~LOC~~ SOSY
PREFILLED_SYRINGE | SUBCUTANEOUS | Status: AC
  Filled 2018-10-18: qty 0.6

## 2018-10-18 MED ORDER — PEGFILGRASTIM-CBQV 6 MG/0.6ML ~~LOC~~ SOSY
6.0000 mg | PREFILLED_SYRINGE | Freq: Once | SUBCUTANEOUS | Status: AC
  Administered 2018-10-18: 6 mg via SUBCUTANEOUS

## 2018-10-18 NOTE — H&P (Signed)
CC/HPI: I have prostate cancer.     Dylan Gonzalez returns today in f/u for a catheter change. He is off of Xarelto and has had a [place] filter placed because of hematuria. He has had some issues with UTI's with bladder spasms. He has bilateral ureteral stents that were placed 5 months ago. His last CT on 08/14/18 showed marked central necrosis of his pelvic mass but it remains large. There was no stent incrustation. He has been having pain at the urethral meatus. he has started chemo and has had 4 cycles of taxotere and gemcitabine and will have 1 starting Monday with more planned. He is currently on oxycodone but not the azo but still has the pain. He was given Myrbetriq but hasn't tried them yet. He has had no further fevers. he has no other associated signs or symptoms.   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 he has a foley and bilateral stents. He has lung mets. he had a TRUS and TUR biopsy on 04/13/18 and had a TOV but had to have the foley replaced.    ALLERGIES: None   MEDICATIONS: Metoprolol Succinate 50 mg tablet, extended release 24 hr  Phenazopyridine Hcl 200 mg tablet  Zyrtec  Amlodipine Besylate 10 mg tablet  Flonase Allergy Relief 50 mcg/actuation spray, suspension  Hydrocodone-Acetaminophen 5 mg-325 mg tablet  Hydrocortisone 1 % cream in packet  Hydroxyzine Pamoate 100 mg capsule  Hyoscyamine Sulfate 0.125 mg tablet  Multivitamin  Polyethylene Glycol  Spironolactone 25 mg tablet     GU PSH: Cystoscopy - 07/19/2018 Locm 300-399Mg /Ml Iodine,1Ml - 04/12/2018    NON-GU PSH: Laparoscopy, Surgical; Repair Umbilical Hernia - about 2016    GU PMH: Dysuria - 06/14/2018 Urinary Retention - 06/14/2018 Prostate nodule w/ LUTS, He is having some catheter irritation/pain but is otherwise doing well. He is waiting for referral to [facility] for a sarcoma specialist and has seen Dr. Alen Blew. he will need chemo and probably radiation therapy. he will see [place] in  2 week for a foley change and see me again in 6 weeks. - 04/26/2018 Ureteral obstruction - 04/26/2018 Chronic kidney disease stage 2 (GFR 60-90), He has a slowly rising Cr with bilateral mild hydro. - 04/12/2018 Gross hematuria, He has gross hematuria that appears to be secondary to a large pelvic mass that is posterior to the prostate. he has some degree of ureteral obstruction from the mass. I am going to get him set up to go to the OR for cystoscopy with possible ureteral stent insertion. I will attempt to biopsy the lesion and will try both a transrectal biopsy and possible a TUR biopsy. I have reviewed the risks of the procedure in detail. - 04/12/2018 Prostate, Neoplasm of uncertain behavior, The mass has central necrosis and there is a small chance it could be a large abscess but it is more consistent with a neoplasm and could be prostatic or possibly from the seminal vesicles. - 04/12/2018 Urinary Urgency, He has outlet obstruction from the mass. 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   Tobacco Use Assessment Completed: Used Tobacco in last 30 days? Has never drank.  Drinks 3 caffeinated  drinks per day. Patient's occupation Geneticist, molecular.     Notes: 2 stepdaughters, 1 stepson   REVIEW OF SYSTEMS:    GU Review Male:   Patient reports burning/ pain with urination. Patient denies frequent urination, hard to postpone urination, get up at night to urinate, leakage of urine, stream starts and stops, trouble starting your stream, have to strain to urinate , erection problems, and penile pain.  Gastrointestinal (Upper):   Patient reports nausea. Patient  denies vomiting and indigestion/ heartburn.  Gastrointestinal (Lower):   Patient denies diarrhea and constipation.  Constitutional:   Patient denies fever, night sweats, weight loss, and fatigue.  Skin:   Patient denies skin rash/ lesion and itching.  Eyes:   Patient denies blurred vision and double vision.  Ears/ Nose/ Throat:   Patient denies sore throat and sinus problems.  Hematologic/Lymphatic:   Patient denies swollen glands and easy bruising.  Cardiovascular:   Patient denies leg swelling and chest pains.  Respiratory:   Patient denies cough and shortness of breath.  Endocrine:   Patient denies excessive thirst.  Musculoskeletal:   Patient denies joint pain and back pain.  Neurological:   Patient denies headaches and dizziness.  Psychologic:   Patient denies depression and anxiety.   VITAL SIGNS:      09/21/2018 02:00 PM  Weight 208 lb / 94.35 kg  Height 71 in / 180.34 cm  BP 122/85 mmHg  Pulse 112 /min  Temperature 98.3 F / 36.8 C  BMI 29.0 kg/m   MULTI-SYSTEM PHYSICAL EXAMINATION:    Constitutional: Well-nourished. No physical deformities. Normally developed. Good grooming.  Respiratory: Normal breath sounds. No labored breathing, no use of accessory muscles.   Cardiovascular: sinus tachycardia without murmur.     PAST DATA REVIEWED:  Source Of History:  Patient  X-Ray Review: C.T. Abdomen/Pelvis: Reviewed Films. Reviewed Report. Discussed With Patient.     04/13/18  PSA  Total PSA 0.61 ng/mL    PROCEDURES:         Simple Foley Catheterization - 54627  A 18 French Foley catheter was inserted into the bladder using sterile technique. The patient was taught routine catheter care. A leg bag was connected. A urine culture was sent to the lab.         Urinalysis w/Scope Dipstick Dipstick Cont'd Micro  Color: Yellow Bilirubin: Neg mg/dL WBC/hpf: >60/hpf  Appearance: Cloudy Ketones: Neg mg/dL RBC/hpf: NS (Not Seen)  Specific Gravity: 1.015 Blood: 3+ ery/uL Bacteria:  Few (10-25/hpf)  pH: 7.0 Protein: 3+ mg/dL Cystals: NS (Not Seen)  Glucose: Neg mg/dL Urobilinogen: 0.2 mg/dL Casts: NS (Not Seen)    Nitrites: Positive Trichomonas: Not Present    Leukocyte Esterase: 3+ leu/uL Mucous: Present      Epithelial Cells: NS (Not Seen)      Yeast: NS (Not Seen)      Sperm: Not Present    Notes: MICROSCOPIC PERFORMED ON UNCONCENTRATED URINE    ASSESSMENT:      ICD-10 Details  1 GU:   Prostate, Neoplasm of uncertain behavior - O35.0 He is responding to chemo with necrosis of the pelvic mass but the mass remains large.   2   Ureteral obstruction - N13.1 He is due for exchange of the ureteral stents. I will schedule replacement in February after his next chemo. I reviewed the risks of bleeding, infection, need for secondary procedures, injury to urinary structures, thrombotic events and anesthetic complications.   3   Urinary Retention - R33.8 Foley was  changed today and a culture was sent.    PLAN:            Medications Stop Meds: Xarelto  Discontinue: 09/21/2018  - Reason: The medication cycle was completed.            Orders Labs Urine Culture          Schedule Return Visit/Planned Activity: Next Available Appointment - Schedule Surgery          Document

## 2018-10-19 ENCOUNTER — Encounter (HOSPITAL_COMMUNITY)
Admission: RE | Disposition: A | Discharge status: Home / Self Care | Payer: Self-pay | Source: Home / Self Care | Attending: Urology

## 2018-10-19 ENCOUNTER — Encounter (HOSPITAL_COMMUNITY): Payer: Self-pay | Admitting: *Deleted

## 2018-10-19 ENCOUNTER — Ambulatory Visit (HOSPITAL_COMMUNITY): Payer: BLUE CROSS/BLUE SHIELD | Admitting: Physician Assistant

## 2018-10-19 ENCOUNTER — Ambulatory Visit (HOSPITAL_COMMUNITY)
Admission: RE | Admit: 2018-10-19 | Discharge: 2018-10-19 | Disposition: A | Discharge status: Home / Self Care | Payer: BLUE CROSS/BLUE SHIELD | Attending: Urology | Admitting: Urology

## 2018-10-19 ENCOUNTER — Ambulatory Visit (HOSPITAL_COMMUNITY): Payer: BLUE CROSS/BLUE SHIELD

## 2018-10-19 DIAGNOSIS — Z79899 Other long term (current) drug therapy: Secondary | ICD-10-CM | POA: Diagnosis not present

## 2018-10-19 DIAGNOSIS — Z7951 Long term (current) use of inhaled steroids: Secondary | ICD-10-CM | POA: Insufficient documentation

## 2018-10-19 DIAGNOSIS — C61 Malignant neoplasm of prostate: Secondary | ICD-10-CM | POA: Insufficient documentation

## 2018-10-19 DIAGNOSIS — I10 Essential (primary) hypertension: Secondary | ICD-10-CM | POA: Diagnosis not present

## 2018-10-19 DIAGNOSIS — C7800 Secondary malignant neoplasm of unspecified lung: Secondary | ICD-10-CM | POA: Insufficient documentation

## 2018-10-19 DIAGNOSIS — I252 Old myocardial infarction: Secondary | ICD-10-CM | POA: Diagnosis not present

## 2018-10-19 DIAGNOSIS — Z466 Encounter for fitting and adjustment of urinary device: Secondary | ICD-10-CM | POA: Diagnosis not present

## 2018-10-19 DIAGNOSIS — N320 Bladder-neck obstruction: Secondary | ICD-10-CM | POA: Insufficient documentation

## 2018-10-19 DIAGNOSIS — Z8673 Personal history of transient ischemic attack (TIA), and cerebral infarction without residual deficits: Secondary | ICD-10-CM | POA: Insufficient documentation

## 2018-10-19 DIAGNOSIS — N182 Chronic kidney disease, stage 2 (mild): Secondary | ICD-10-CM | POA: Insufficient documentation

## 2018-10-19 DIAGNOSIS — Z79891 Long term (current) use of opiate analgesic: Secondary | ICD-10-CM | POA: Diagnosis not present

## 2018-10-19 DIAGNOSIS — I5032 Chronic diastolic (congestive) heart failure: Secondary | ICD-10-CM | POA: Diagnosis not present

## 2018-10-19 DIAGNOSIS — E1122 Type 2 diabetes mellitus with diabetic chronic kidney disease: Secondary | ICD-10-CM | POA: Insufficient documentation

## 2018-10-19 DIAGNOSIS — Z85840 Personal history of malignant neoplasm of eye: Secondary | ICD-10-CM | POA: Insufficient documentation

## 2018-10-19 DIAGNOSIS — I130 Hypertensive heart and chronic kidney disease with heart failure and stage 1 through stage 4 chronic kidney disease, or unspecified chronic kidney disease: Secondary | ICD-10-CM | POA: Insufficient documentation

## 2018-10-19 DIAGNOSIS — I2510 Atherosclerotic heart disease of native coronary artery without angina pectoris: Secondary | ICD-10-CM | POA: Diagnosis not present

## 2018-10-19 DIAGNOSIS — N135 Crossing vessel and stricture of ureter without hydronephrosis: Secondary | ICD-10-CM | POA: Insufficient documentation

## 2018-10-19 DIAGNOSIS — Z86718 Personal history of other venous thrombosis and embolism: Secondary | ICD-10-CM | POA: Diagnosis not present

## 2018-10-19 HISTORY — PX: CYSTOSCOPY WITH STENT PLACEMENT: SHX5790

## 2018-10-19 LAB — CBC
HCT: 27.1 % — ABNORMAL LOW (ref 39.0–52.0)
Hemoglobin: 8.1 g/dL — ABNORMAL LOW (ref 13.0–17.0)
MCH: 27.4 pg (ref 26.0–34.0)
MCHC: 29.9 g/dL — ABNORMAL LOW (ref 30.0–36.0)
MCV: 91.6 fL (ref 80.0–100.0)
Platelets: 196 10*3/uL (ref 150–400)
RBC: 2.96 MIL/uL — ABNORMAL LOW (ref 4.22–5.81)
RDW: 21.2 % — AB (ref 11.5–15.5)
WBC: 40.2 10*3/uL — ABNORMAL HIGH (ref 4.0–10.5)
nRBC: 0 % (ref 0.0–0.2)

## 2018-10-19 LAB — GLUCOSE, CAPILLARY
Glucose-Capillary: 107 mg/dL — ABNORMAL HIGH (ref 70–99)
Glucose-Capillary: 110 mg/dL — ABNORMAL HIGH (ref 70–99)

## 2018-10-19 SURGERY — CYSTOSCOPY, WITH STENT INSERTION
Anesthesia: General | Site: Ureter | Laterality: Bilateral

## 2018-10-19 MED ORDER — STERILE WATER FOR IRRIGATION IR SOLN
Status: DC | PRN
  Administered 2018-10-19: 3000 mL
  Administered 2018-10-19: 1000 mL

## 2018-10-19 MED ORDER — ONDANSETRON HCL 4 MG/2ML IJ SOLN
INTRAMUSCULAR | Status: DC | PRN
  Administered 2018-10-19: 4 mg via INTRAVENOUS

## 2018-10-19 MED ORDER — LIDOCAINE 2% (20 MG/ML) 5 ML SYRINGE
INTRAMUSCULAR | Status: DC | PRN
  Administered 2018-10-19: 80 mg via INTRAVENOUS

## 2018-10-19 MED ORDER — FENTANYL CITRATE (PF) 100 MCG/2ML IJ SOLN
INTRAMUSCULAR | Status: AC
  Filled 2018-10-19: qty 2

## 2018-10-19 MED ORDER — CIPROFLOXACIN IN D5W 400 MG/200ML IV SOLN
400.0000 mg | INTRAVENOUS | Status: AC
  Administered 2018-10-19: 400 mg via INTRAVENOUS
  Filled 2018-10-19: qty 200

## 2018-10-19 MED ORDER — PROPOFOL 10 MG/ML IV BOLUS
INTRAVENOUS | Status: AC
  Filled 2018-10-19: qty 20

## 2018-10-19 MED ORDER — ONDANSETRON HCL 4 MG/2ML IJ SOLN
INTRAMUSCULAR | Status: AC
  Filled 2018-10-19: qty 2

## 2018-10-19 MED ORDER — MIDAZOLAM HCL 2 MG/2ML IJ SOLN
INTRAMUSCULAR | Status: AC
  Filled 2018-10-19: qty 2

## 2018-10-19 MED ORDER — PHENYLEPHRINE 40 MCG/ML (10ML) SYRINGE FOR IV PUSH (FOR BLOOD PRESSURE SUPPORT)
PREFILLED_SYRINGE | INTRAVENOUS | Status: AC
  Filled 2018-10-19: qty 10

## 2018-10-19 MED ORDER — FENTANYL CITRATE (PF) 100 MCG/2ML IJ SOLN
INTRAMUSCULAR | Status: DC | PRN
  Administered 2018-10-19: 50 ug via INTRAVENOUS
  Administered 2018-10-19: 25 ug via INTRAVENOUS
  Administered 2018-10-19: 50 ug via INTRAVENOUS
  Administered 2018-10-19: 25 ug via INTRAVENOUS

## 2018-10-19 MED ORDER — LIDOCAINE 2% (20 MG/ML) 5 ML SYRINGE
INTRAMUSCULAR | Status: AC
  Filled 2018-10-19: qty 5

## 2018-10-19 MED ORDER — MIDAZOLAM HCL 5 MG/5ML IJ SOLN
INTRAMUSCULAR | Status: DC | PRN
  Administered 2018-10-19: 1 mg via INTRAVENOUS

## 2018-10-19 MED ORDER — PHENYLEPHRINE 40 MCG/ML (10ML) SYRINGE FOR IV PUSH (FOR BLOOD PRESSURE SUPPORT)
PREFILLED_SYRINGE | INTRAVENOUS | Status: DC | PRN
  Administered 2018-10-19 (×4): 80 ug via INTRAVENOUS

## 2018-10-19 MED ORDER — PROPOFOL 10 MG/ML IV BOLUS
INTRAVENOUS | Status: DC | PRN
  Administered 2018-10-19: 150 mg via INTRAVENOUS
  Administered 2018-10-19 (×2): 25 mg via INTRAVENOUS

## 2018-10-19 MED ORDER — LACTATED RINGERS IV SOLN
INTRAVENOUS | Status: DC
  Administered 2018-10-19: 09:00:00 via INTRAVENOUS

## 2018-10-19 SURGICAL SUPPLY — 15 items
BAG URINE DRAINAGE (UROLOGICAL SUPPLIES) ×1 IMPLANT
BAG URINE LEG 500ML (DRAIN) ×1 IMPLANT
BAG URO CATCHER STRL LF (MISCELLANEOUS) ×2 IMPLANT
CATH TIEMANN FOLEY 18FR 5CC (CATHETERS) ×1 IMPLANT
CATH URET 5FR 28IN OPEN ENDED (CATHETERS) IMPLANT
CLOTH BEACON ORANGE TIMEOUT ST (SAFETY) ×2 IMPLANT
COVER WAND RF STERILE (DRAPES) IMPLANT
GLOVE SURG SS PI 8.0 STRL IVOR (GLOVE) IMPLANT
GOWN STRL REUS W/TWL XL LVL3 (GOWN DISPOSABLE) ×2 IMPLANT
GUIDEWIRE STR DUAL SENSOR (WIRE) ×3 IMPLANT
LEGGING LITHOTOMY PAIR STRL (DRAPES) ×1 IMPLANT
MANIFOLD NEPTUNE II (INSTRUMENTS) ×2 IMPLANT
PACK CYSTO (CUSTOM PROCEDURE TRAY) ×2 IMPLANT
STENT URET 6FRX26 CONTOUR (STENTS) ×1 IMPLANT
TUBING CONNECTING 10 (TUBING) ×2 IMPLANT

## 2018-10-19 NOTE — Transfer of Care (Signed)
Immediate Anesthesia Transfer of Care Note  Patient: Dylan Gonzalez  Procedure(s) Performed: CYSTOSCOPY WITH STENT EXCHANGE Bilateral (Bilateral Ureter)  Patient Location: PACU  Anesthesia Type:General  Level of Consciousness: drowsy and patient cooperative  Airway & Oxygen Therapy: Patient Spontanous Breathing and Patient connected to face mask oxygen  Post-op Assessment: Report given to RN and Post -op Vital signs reviewed and stable  Post vital signs: Reviewed and stable  Last Vitals:  Vitals Value Taken Time  BP 112/75 10/19/2018 11:16 AM  Temp    Pulse 83 10/19/2018 11:19 AM  Resp 17 10/19/2018 11:19 AM  SpO2 100 % 10/19/2018 11:19 AM  Vitals shown include unvalidated device data.  Last Pain:  Vitals:   10/19/18 0837  TempSrc:   PainSc: 7       Patients Stated Pain Goal: 5 (16/96/78 9381)  Complications: No apparent anesthesia complications

## 2018-10-19 NOTE — Discharge Instructions (Addendum)
Ureteral Stent Implantation, Care After Refer to this sheet in the next few weeks. These instructions provide you with information about caring for yourself after your procedure. Your health care provider may also give you more specific instructions. Your treatment has been planned according to current medical practices, but problems sometimes occur. Call your health care provider if you have any problems or questions after your procedure. What can I expect after the procedure? After the procedure, it is common to have:  Nausea.  Mild pain when you urinate. You may feel this pain in your lower back or lower abdomen. Pain should stop within a few minutes after you urinate. This may last for up to 1 week.  A small amount of blood in your urine for several days. Follow these instructions at home:  Medicines  Take over-the-counter and prescription medicines only as told by your health care provider.  If you were prescribed an antibiotic medicine, take it as told by your health care provider. Do not stop taking the antibiotic even if you start to feel better.  Do not drive for 24 hours if you received a sedative.  Do not drive or operate heavy machinery while taking prescription pain medicines. Activity  Return to your normal activities as told by your health care provider. Ask your health care provider what activities are safe for you.  Do not lift anything that is heavier than 10 lb (4.5 kg). Follow this limit for 1 week after your procedure, or for as long as told by your health care provider. General instructions  Watch for any blood in your urine. Call your health care provider if the amount of blood in your urine increases.  If you have a catheter: ? Follow instructions from your health care provider about taking care of your catheter and collection bag. ? Do not take baths, swim, or use a hot tub until your health care provider approves.  Drink enough fluid to keep your urine  clear or pale yellow.  Keep all follow-up visits as told by your health care provider. This is important. Contact a health care provider if:  You have pain that gets worse or does not get better with medicine, especially pain when you urinate.  You have difficulty urinating.  You feel nauseous or you vomit repeatedly during a period of more than 2 days after the procedure. Get help right away if:  Your urine is dark red or has blood clots in it.  You are leaking urine (have incontinence).  The end of the stent comes out of your urethra.  You cannot urinate.  You have sudden, sharp, or severe pain in your abdomen or lower back.  You have a fever. This information is not intended to replace advice given to you by your health care provider. Make sure you discuss any questions you have with your health care provider. Document Released: 04/25/2013 Document Revised: 01/29/2016 Document Reviewed: 03/07/2015 Elsevier Interactive Patient Education  2019 [employer].  Indwelling Urinary Catheter Care, Adult An indwelling urinary catheter is a thin, flexible, germ-free (sterile) tube that is placed into the bladder to help drain urine out of the body. The catheter is inserted into the part of the body that drains urine from the bladder (urethra). Urine drains from the catheter into a drainage bag outside of the body. Taking good care of your catheter will keep it working properly and help to prevent problems from developing. What are the risks?  Bacteria may get into your  bladder and cause a urinary tract infection.  Urine flow can become blocked. This can happen if the catheter is not working correctly, or if you have sediment or a blood clot in your bladder or the catheter.  Tissue near the catheter may become irritated and bleed. How to wear your catheter and your drainage bag Supplies needed  Adhesive tape or a leg strap.  Alcohol wipe or soap and water (if you use tape).  A  clean towel (if you use tape).  Overnight drainage bag.  Smaller drainage bag (leg bag). Wearing your catheter and bag Use adhesive tape or a leg strap to attach your catheter to your leg.  Make sure the catheter is not pulled tight.  If a leg strap gets wet, replace it with a dry one.  If you use adhesive tape: 1. Use an alcohol wipe or soap and water to wash off any stickiness on your skin where you had tape before. 2. Use a clean towel to pat-dry the area. 3. Apply the new tape. You should have received a large overnight drainage bag and a smaller leg bag that fits underneath clothing.  You may wear the overnight bag at any time, but you should not wear the leg bag at night.  Always wear the leg bag below your knee.  Make sure the overnight drainage bag is always lower than the level of your bladder, but do not let it touch the floor. Before you go to sleep, hang the bag inside a wastebasket that is covered by a clean plastic bag. How to care for your skin around the catheter     Supplies needed  A clean washcloth.  Water and mild soap.  A clean towel. Caring for your skin and catheter  Every day, use a clean washcloth and soapy water to clean the skin around your catheter. ? Wash your hands with soap and water. ? Wet a washcloth in warm water and mild soap. ? Clean the skin around your urethra. ? If you are male: ? Use one hand to gently spread the folds of skin around your vagina (labia). ? With the washcloth in your other hand, wipe the inner side of your labia on each side. Do this in a front-to-back direction. ? If you are male: ? Use one hand to pull back any skin that covers the end of your penis (foreskin). ? With the washcloth in your other hand, wipe your penis in small circles. Start wiping at the tip of your penis, then move outward from the catheter. ? With your free hand, hold the catheter close to where it enters your body. Keep holding the catheter  during cleaning so it does not get pulled out. ? Use your other hand to clean the catheter with the washcloth. ? Only wipe downward on the catheter. ? Do not wipe upward toward your body, because that may push bacteria into your urethra and cause infection. ? Use a clean towel to pat-dry the catheter and the skin around it. Make sure to wipe off all soap. ? Wash your hands with soap and water.  Shower every day. Do not take baths.  Do not use cream, ointment, or lotion on the area where the catheter enters your body, unless your health care provider tells you to do that.  Do not use powders, sprays, or lotions on your genital area.  Check your skin around the catheter every day for signs of infection. Check for: ? Redness, swelling,  or pain. ? Fluid or blood. ? Warmth. ? Pus or a bad smell. How to empty the drainage bag Supplies needed  Rubbing alcohol.  Gauze pad or cotton ball.  Adhesive tape or a leg strap. Emptying the bag Empty your drainage bag (your overnight drainage bag or your leg bag) when it is ?- full, or at least 2-3 times a day. Clean the drainage bag according to the manufacturer's instructions or as told by your health care provider. 1. Wash your hands with soap and water. 2. Detach the drainage bag from your leg. 3. Hold the drainage bag over the toilet or a clean container. Make sure the drainage bag is lower than your hips and bladder. This stops urine from going back into the tubing and into your bladder. 4. Open the pour spout at the bottom of the bag. 5. Empty the urine into the toilet or container. Do not let the pour spout touch any surface. This precaution is important to prevent bacteria from getting in the bag and causing infection. 6. Apply rubbing alcohol to a gauze pad or cotton ball. 7. Use the gauze pad or cotton ball to clean the pour spout. 8. Close the pour spout. 9. Attach the bag to your leg with adhesive tape or a leg strap. 10. Wash your  hands with soap and water. How to change the drainage bag Supplies needed:  Alcohol wipes.  A clean drainage bag.  Adhesive tape or a leg strap. Changing the bag Replace your drainage bag with a clean bag once a month. Replace the bag sooner if it leaks, starts to smell bad, or looks dirty. 1. Wash your hands with soap and water. 2. Detach the dirty drainage bag from your leg. 3. Pinch the catheter with your fingers so that urine does not spill out. 4. Disconnect the catheter tube from the drainage tube at the connection valve. Do not let the tubes touch any surface. 5. Clean the end of the catheter tube with an alcohol wipe. Use a different alcohol wipe to clean the end of the drainage tube. 6. Connect the catheter tube to the drainage tube of the clean bag. 7. Attach the clean bag to your leg with adhesive tape or a leg strap. Avoid attaching the new bag too tightly. 8. Wash your hands with soap and water. General instructions   Never pull on your catheter or try to remove it. Pulling can damage your internal tissues.  Always wash your hands before and after you handle your catheter or drainage bag. Use a mild, fragrance-free soap. If soap and water are not available, use hand sanitizer.  Always make sure there are no twists or bends (kinks) in the catheter tube.  Always make sure there are no leaks in the catheter or drainage bag.  Drink enough fluid to keep your urine pale yellow.  Do not take baths, swim, or use a hot tub.  If you are male, wipe from front to back after having a bowel movement. Contact a health care provider if:  Your urine is cloudy.  Your urine smells unusually bad.  Your catheter gets clogged.  Your catheter starts to leak.  Your bladder feels full. Get help right away if:  You have redness, swelling, or pain where the catheter enters your body.  You have fluid, blood, pus, or a bad smell coming from the area where the catheter enters your  body.  The area where the catheter enters your body feels warm  to the touch.  You have a fever.  You have pain in your abdomen, legs, lower back, or bladder.  You see blood in the catheter.  Your urine is pink or red.  You have nausea, vomiting, or chills.  Your urine is not draining into the bag.  Your catheter gets pulled out. Summary  An indwelling urinary catheter is a thin, flexible, germ-free (sterile) tube that is placed into the bladder to help drain urine out of the body.  The catheter is inserted into the part of the body that drains urine from the bladder (urethra).  Take good care of your catheter to keep it working properly and help prevent problems from developing.  Always wash your hands before and after you handle your catheter or drainage bag.  Never pull on your catheter or try to remove it. This information is not intended to replace advice given to you by your health care provider. Make sure you discuss any questions you have with your health care provider. Document Released: 08/23/2005 Document Revised: 02/13/2018 Document Reviewed: 04/08/2017 Elsevier Interactive Patient Education  2019 [employer].

## 2018-10-19 NOTE — Op Note (Signed)
Procedure: Cystoscopy with removal and replacement of bilateral double-J stents.  Preop diagnosis: Bilateral ureteral obstruction.  Postop diagnosis: Same.  Surgeon: Dr. Irine Seal.  Anesthesia: General.  Specimen: None.  Drains: Bilateral 6 French by 26 cm contour double-J stents and 18 French coud Foley catheter.  EBL: None.  Complications: None.  Indications: [name] is a 53 year old male with a large prostatic sarcoma with bilateral ureteral obstructions and urinary retention that is managed by bilateral stents and Foley.  He is to undergo stent change today.  Procedure: He was given Cipro.  He was taken operating room where general anesthetic was induced.  He was placed in lithotomy position and fitted with PAS hose.  His perineum and genitalia were prepped with Betadine solution he was draped in usual sterile fashion.  Cystoscopy was performed with a 23 [place] scope and 30 degree lens.  Examination revealed a normal urethra.  The external sphincter was intact.  The prostatic urethra had no lateral obstruction but there was a large midline posterior mass that extended extravesically under the base of the bladder.  Bilateral ureteral stents were identified and there was significant stent edema around both stents.  The remainder of the bladder wall was unremarkable.  The left stent was grasped with a grasping forceps and pulled to the urethral meatus.  An attempt was made to pass a wire through the stent but encrustation made that unsuccessful.  I then passed a wire alongside the stent to the kidney and then removed the stent.  A fresh 6 [place] by 26 cm contour double-J stent was then inserted to the kidney under fluoroscopic guidance.  With the wire was removed, leaving a good coil in the kidney and a good coil in the bladder.  I then passed a wire alongside the right ureteral stent and then use grasping forceps to remove the stent.  A fresh 6 [place] by 26 cm contour double-J stent  was then advanced over the right wire to the kidney under fluoroscopic guidance.  I did have the wire come out but was able to cannulate the orifice and readvanced it before the stent was placed.  The wire was removed from the stent leaving a good coil in the kidney and in the bladder.  The cystoscope was removed and a fresh 18 French coud Foley catheter was inserted.  The balloon was filled with 10 mL of sterile water.  The catheter was placed the leg bag drainage.  He was taken down from lithotomy position, his anesthetic was reversed and he was moved recovery in stable condition.  There were no complications.

## 2018-10-19 NOTE — Interval H&P Note (Signed)
History and Physical Interval Note:  10/19/2018 9:53 AM  Dylan Gonzalez  has presented today for surgery, with the diagnosis of BILATERAL URETERAL OBSTRUCTION  The various methods of treatment have been discussed with the patient and family. After consideration of risks, benefits and other options for treatment, the patient has consented to  Procedure(s): CYSTOSCOPY WITH STENT EXCHANGE (Bilateral) as a surgical intervention .  The patient's history has been reviewed, patient examined, no change in status, stable for surgery.  I have reviewed the patient's chart and labs.  Questions were answered to the patient's satisfaction.     Irine Seal

## 2018-10-19 NOTE — Anesthesia Procedure Notes (Signed)
Procedure Name: LMA Insertion Date/Time: 10/19/2018 10:29 AM Performed by: Maxwell Caul, CRNA Pre-anesthesia Checklist: Patient identified, Emergency Drugs available, Suction available and Patient being monitored Patient Re-evaluated:Patient Re-evaluated prior to induction Oxygen Delivery Method: Circle system utilized Preoxygenation: Pre-oxygenation with 100% oxygen Induction Type: IV induction LMA: LMA inserted LMA Size: 4.0 Number of attempts: 1 Placement Confirmation: positive ETCO2 and breath sounds checked- equal and bilateral Tube secured with: Tape Dental Injury: Teeth and Oropharynx as per pre-operative assessment

## 2018-10-20 ENCOUNTER — Encounter (HOSPITAL_COMMUNITY): Payer: Self-pay | Admitting: Urology

## 2018-10-21 ENCOUNTER — Emergency Department (HOSPITAL_BASED_OUTPATIENT_CLINIC_OR_DEPARTMENT_OTHER): Payer: BLUE CROSS/BLUE SHIELD

## 2018-10-21 ENCOUNTER — Encounter (HOSPITAL_COMMUNITY): Payer: Self-pay | Admitting: Emergency Medicine

## 2018-10-21 ENCOUNTER — Emergency Department (HOSPITAL_COMMUNITY): Payer: BLUE CROSS/BLUE SHIELD

## 2018-10-21 ENCOUNTER — Emergency Department (HOSPITAL_COMMUNITY)
Admission: EM | Admit: 2018-10-21 | Discharge: 2018-10-21 | Disposition: A | Discharge status: Home / Self Care | Payer: BLUE CROSS/BLUE SHIELD | Attending: Emergency Medicine | Admitting: Emergency Medicine

## 2018-10-21 ENCOUNTER — Other Ambulatory Visit: Payer: Self-pay

## 2018-10-21 DIAGNOSIS — K5900 Constipation, unspecified: Secondary | ICD-10-CM | POA: Diagnosis not present

## 2018-10-21 DIAGNOSIS — R609 Edema, unspecified: Secondary | ICD-10-CM

## 2018-10-21 DIAGNOSIS — R600 Localized edema: Secondary | ICD-10-CM

## 2018-10-21 DIAGNOSIS — I5032 Chronic diastolic (congestive) heart failure: Secondary | ICD-10-CM | POA: Diagnosis not present

## 2018-10-21 DIAGNOSIS — N1339 Other hydronephrosis: Secondary | ICD-10-CM | POA: Diagnosis not present

## 2018-10-21 DIAGNOSIS — N182 Chronic kidney disease, stage 2 (mild): Secondary | ICD-10-CM | POA: Diagnosis not present

## 2018-10-21 DIAGNOSIS — Z85831 Personal history of malignant neoplasm of soft tissue: Secondary | ICD-10-CM | POA: Diagnosis not present

## 2018-10-21 DIAGNOSIS — Z85118 Personal history of other malignant neoplasm of bronchus and lung: Secondary | ICD-10-CM | POA: Insufficient documentation

## 2018-10-21 DIAGNOSIS — R1030 Lower abdominal pain, unspecified: Secondary | ICD-10-CM

## 2018-10-21 DIAGNOSIS — I130 Hypertensive heart and chronic kidney disease with heart failure and stage 1 through stage 4 chronic kidney disease, or unspecified chronic kidney disease: Secondary | ICD-10-CM | POA: Diagnosis not present

## 2018-10-21 DIAGNOSIS — Z79899 Other long term (current) drug therapy: Secondary | ICD-10-CM | POA: Insufficient documentation

## 2018-10-21 LAB — URINALYSIS, ROUTINE W REFLEX MICROSCOPIC
Bilirubin Urine: NEGATIVE
Glucose, UA: NEGATIVE mg/dL
Ketones, ur: NEGATIVE mg/dL
NITRITE: NEGATIVE
Protein, ur: 100 mg/dL — AB
RBC / HPF: >50 RBC/hpf — ABNORMAL HIGH (ref 0–5)
Specific Gravity, Urine: 1.036 — ABNORMAL HIGH (ref 1.005–1.030)
WBC, UA: >50 WBC/hpf — ABNORMAL HIGH (ref 0–5)
pH: 6 (ref 5.0–8.0)

## 2018-10-21 LAB — CBC WITH DIFFERENTIAL/PLATELET
Abs Immature Granulocytes: 8.46 10*3/uL — ABNORMAL HIGH (ref 0.00–0.07)
Basophils Absolute: 0.2 10*3/uL — ABNORMAL HIGH (ref 0.0–0.1)
Basophils Relative: 1 %
Eosinophils Absolute: 0.2 10*3/uL (ref 0.0–0.5)
Eosinophils Relative: 1 %
HCT: 26.1 % — ABNORMAL LOW (ref 39.0–52.0)
Hemoglobin: 7.9 g/dL — ABNORMAL LOW (ref 13.0–17.0)
Immature Granulocytes: 34 %
Lymphocytes Relative: 3 %
Lymphs Abs: 0.8 10*3/uL (ref 0.7–4.0)
MCH: 27.1 pg (ref 26.0–34.0)
MCHC: 30.3 g/dL (ref 30.0–36.0)
MCV: 89.7 fL (ref 80.0–100.0)
Monocytes Absolute: 0.4 10*3/uL (ref 0.1–1.0)
Monocytes Relative: 2 %
NRBC: 0.1 % (ref 0.0–0.2)
Neutro Abs: 14.7 10*3/uL — ABNORMAL HIGH (ref 1.7–7.7)
Neutrophils Relative %: 59 %
Platelets: 149 10*3/uL — ABNORMAL LOW (ref 150–400)
RBC: 2.91 MIL/uL — ABNORMAL LOW (ref 4.22–5.81)
RDW: 21.3 % — ABNORMAL HIGH (ref 11.5–15.5)
WBC: 24.8 10*3/uL — ABNORMAL HIGH (ref 4.0–10.5)

## 2018-10-21 LAB — COMPREHENSIVE METABOLIC PANEL
ALT: 11 U/L (ref 0–44)
AST: 17 U/L (ref 15–41)
Albumin: 2.8 g/dL — ABNORMAL LOW (ref 3.5–5.0)
Alkaline Phosphatase: 89 U/L (ref 38–126)
Anion gap: 8 (ref 5–15)
BUN: 20 mg/dL (ref 6–20)
CHLORIDE: 101 mmol/L (ref 98–111)
CO2: 27 mmol/L (ref 22–32)
CREATININE: 1.07 mg/dL (ref 0.61–1.24)
Calcium: 8.2 mg/dL — ABNORMAL LOW (ref 8.9–10.3)
GFR calc non Af Amer: >60 mL/min (ref >60)
Glucose, Bld: 106 mg/dL — ABNORMAL HIGH (ref 70–99)
Potassium: 3.4 mmol/L — ABNORMAL LOW (ref 3.5–5.1)
SODIUM: 136 mmol/L (ref 135–145)
Total Bilirubin: 0.6 mg/dL (ref 0.3–1.2)
Total Protein: 6.3 g/dL — ABNORMAL LOW (ref 6.5–8.1)

## 2018-10-21 LAB — BRAIN NATRIURETIC PEPTIDE: B Natriuretic Peptide: 234.6 pg/mL — ABNORMAL HIGH (ref 0.0–100.0)

## 2018-10-21 LAB — LIPASE, BLOOD: Lipase: 21 U/L (ref 11–51)

## 2018-10-21 MED ORDER — FUROSEMIDE 40 MG PO TABS: 40.0000 mg | ORAL_TABLET | Freq: Every day | ORAL | 0 refills | Status: DC

## 2018-10-21 MED ORDER — POTASSIUM CHLORIDE ER 10 MEQ PO TBCR: 10.0000 meq | EXTENDED_RELEASE_TABLET | Freq: Every day | ORAL | 0 refills | Status: DC

## 2018-10-21 MED ORDER — SODIUM CHLORIDE (PF) 0.9 % IJ SOLN
INTRAMUSCULAR | Status: AC
  Filled 2018-10-21: qty 50

## 2018-10-21 MED ORDER — MORPHINE SULFATE (PF) 4 MG/ML IV SOLN
4.0000 mg | Freq: Once | INTRAVENOUS | Status: AC
  Administered 2018-10-21: 4 mg via INTRAVENOUS
  Filled 2018-10-21: qty 1

## 2018-10-21 MED ORDER — POTASSIUM CHLORIDE CRYS ER 20 MEQ PO TBCR
40.0000 meq | EXTENDED_RELEASE_TABLET | Freq: Once | ORAL | Status: AC
  Administered 2018-10-21: 40 meq via ORAL
  Filled 2018-10-21: qty 2

## 2018-10-21 MED ORDER — IOPAMIDOL (ISOVUE-300) INJECTION 61%
INTRAVENOUS | Status: AC
  Administered 2018-10-21: 100 mL
  Filled 2018-10-21: qty 100

## 2018-10-21 MED ORDER — HEPARIN SOD (PORK) LOCK FLUSH 100 UNIT/ML IV SOLN
500.0000 [IU] | Freq: Once | INTRAVENOUS | Status: AC
  Administered 2018-10-21: 500 [IU]
  Filled 2018-10-21: qty 5

## 2018-10-21 MED ORDER — ONDANSETRON HCL 4 MG/2ML IJ SOLN
4.0000 mg | Freq: Once | INTRAMUSCULAR | Status: AC
  Administered 2018-10-21: 4 mg via INTRAVENOUS
  Filled 2018-10-21: qty 2

## 2018-10-21 MED ORDER — FUROSEMIDE 10 MG/ML IJ SOLN
40.0000 mg | Freq: Once | INTRAMUSCULAR | Status: AC
  Administered 2018-10-21: 40 mg via INTRAVENOUS
  Filled 2018-10-21: qty 4

## 2018-10-21 MED ORDER — POLYETHYLENE GLYCOL 3350 17 GM/SCOOP PO POWD: 1.0000 | Freq: Every day | ORAL | 0 refills | Status: DC

## 2018-10-21 NOTE — ED Triage Notes (Signed)
Pt states he has a urinary catheter x several days, pt states urine has blood in it, catheter draining without difficulty.

## 2018-10-21 NOTE — Anesthesia Postprocedure Evaluation (Signed)
Anesthesia Post Note  Patient: Dylan Gonzalez  Procedure(s) Performed: CYSTOSCOPY WITH STENT EXCHANGE Bilateral (Bilateral Ureter)     Patient location during evaluation: PACU Anesthesia Type: General Level of consciousness: sedated and patient cooperative Pain management: pain level controlled Vital Signs Assessment: post-procedure vital signs reviewed and stable Respiratory status: spontaneous breathing Cardiovascular status: stable Anesthetic complications: no    Last Vitals:  Vitals:   10/19/18 1202 10/19/18 1250  BP: (!) 132/93 (!) 133/93  Pulse: 87 82  Resp: 18 18  Temp:  36.7 C  SpO2: 100% 95%    Last Pain:  Vitals:   10/19/18 1250  TempSrc: Oral  PainSc: 0-No pain   Pain Goal: Patients Stated Pain Goal: 5 (10/19/18 0837)                 Nolon Nations

## 2018-10-21 NOTE — ED Triage Notes (Addendum)
Pt c/o RLQ abdominal pain and constipation x 4 days. Pt states he has tried OTC remedies that aren't working. Pt also c/o bilateral lower extremity edema from thighs to feet.

## 2018-10-21 NOTE — ED Provider Notes (Signed)
[facility] DEPT Provider Note   CSN: [id number] Arrival date & time: 10/21/18  1207     History   Chief Complaint Chief Complaint  Patient presents with  . Abdominal Pain  . Constipation    HPI Dylan Gonzalez is a 53 y.o. male.  Dylan Gonzalez is a 53 y.o. 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  He reports his abdomen started hurting 4 days ago and he associates this with not being able to pass a bowel movement for the past 4 days.  He is tried to have a bowel movement multiple times and has tried over-the-counter remedies without resolution.  He denies associated nausea or vomiting.  No fevers or chills.  His urinary catheter has been flowing normally, he had ureteral stent changed 2 days ago with Dr. Jeffie Pollock and has had some hematuria, which he was told should be expected.  He also reports that he has chronic bilateral lower extremity edema but this hass worsened significantly in the last 3 to 4 days.  He denies associated chest pain or shortness of breath.  Reports he does not take a fluid pill for this edema.  He is followed by Dr. Alen Blew with oncology for his prostate cancer. Currently on Taxotere and gemcitabine, s/p 5 cycles of therapy.  Patient takes chronic oxycodone and morphine for his abdominal pain associated with his cancer diagnosis, reports pain has been slightly worse than normal since constipation began.  No headache, confusion or altered mental status.     Past Medical History:  Diagnosis Date  . Bilateral lower extremity edema   . Chronic diastolic congestive heart failure ([place]) 07/2018  . CKD (chronic kidney disease), stage II   . DOE (dyspnea on exertion)   . Foley catheter in place   . Hematuria   .  History of CVA (cerebrovascular accident) 2007   per pt no residuals  . History of DVT of lower extremity 06/2018   left common femoral vein,  started on anticoagulant , stopped due to hematuria,  IVC filter placed 08-16-2018  . History of sepsis 09/28/2018   uti  . Hypertension   . Malignant neoplasm metastatic to lung ([place]) 04/19/2018  . Port-A-Cath in place 06/21/2018  . 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 S/P IVC filter 08/16/2018  . Urinary retention 09/28/2018   CHRONIC FOLEY CATH    Patient Active Problem List   Diagnosis Date Noted  . Abdominal pain 09/28/2018  . Sepsis ([place]) 09/28/2018  . Anemia 08/14/2018  . Symptomatic anemia 08/13/2018  . Acute UTI (urinary tract infection) 08/02/2018  . Deep vein thrombosis (DVT) of left lower extremity ([place]) 08/02/2018  . Bilateral lower extremity edema 08/01/2018  . CAD (coronary artery disease) 08/01/2018  . Hypokalemia 08/01/2018  . Port-A-Cath in place 06/29/2018  . Lung nodules 05/24/2018  . Leiomyosarcoma ([place]) 05/23/2018  . Goals of care, counseling/discussion 05/15/2018  . Bilateral ureteral obstruction 04/19/2018  . Malignant neoplasm metastatic to lung ([place]) 04/19/2018  . Primary leiomyosarcoma of intra-abdominal site ([place]) 04/18/2018  . Prostate neoplasm  04/13/2018  . Hypertension 12/26/2011  . 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 CYSTOSCOPY W/ URETERAL STENT PLACEMENT Bilateral 04/13/2018   Procedure: CYSTOSCOPY WITH BILATERAL STENT REPLACEMENT;  Surgeon: Irine Seal, MD;  Location: [facility];  Service: Urology;  Laterality: Bilateral;  . CYSTOSCOPY WITH STENT PLACEMENT Bilateral 10/19/2018   Procedure: CYSTOSCOPY WITH STENT EXCHANGE  Bilateral;  Surgeon: Irine Seal, MD;  Location: [facility];  Service: Urology;  Laterality: Bilateral;  . ENUCLEATION Left 1976   removal left eye due to Retinoplastoma  . INTRAOCULAR PROSTHESES INSERTION    . IR IMAGING GUIDED PORT INSERTION  06/21/2018  . IR IVC FILTER PLMT / S&I /IMG GUID/MOD SED  08/16/2018  . LAPAROSCOPIC INGUINAL HERNIA REPAIR Right 11-19-2010   dr d. Ninfa Linden  @[facility]    AND UMBILICAL HERNIA REPAIR  . PROSTATE BIOPSY [name] 04/13/2018   Procedure: ULTRASOUND GUIDED PROSTATE BIOPSY;  Surgeon: Irine Seal, MD;  Location: [facility];  Service: Urology;  Laterality: [name];  . TOOTH EXTRACTION Right 04/21/2013   Procedure: EXTRACTION MOLARS;  Surgeon: [name], DDS;  Location: [facility];  Service: Oral Surgery;  Laterality: Right;  . TRANSURETHRAL RESECTION OF PROSTATE  04/13/2018   Procedure: TRANSURETHRAL RESECTION OF THE PROSTATE (TURP);  Surgeon: Irine Seal, MD;  Location: [facility];  Service: Urology;;        Home Medications    Prior to Admission medications   Medication Sig Start Date End Date Taking? Authorizing Provider  acetaminophen (TYLENOL) 500 MG tablet Take 1,000 mg by mouth every 6 (six) hours as needed for fever.   Yes [provider]  albuterol (PROVENTIL HFA) 108 (90 Base) MCG/ACT inhaler Inhale 1-2 puffs into the lungs every 6 (six) hours as needed for wheezing or shortness of breath.   Yes [provider]  docusate sodium (COLACE) 100 MG capsule Take 100 mg by mouth daily as needed for mild constipation.   Yes [provider]  escitalopram (LEXAPRO) 10 MG tablet Take 10 mg by mouth daily. 09/26/18  Yes [provider]  fluticasone (FLONASE) 50 MCG/ACT nasal spray PLACE 2 SPRAYS INTO BOTH NOSTRILS DAILY. Patient taking differently: Place 2 sprays into both nostrils daily as needed for allergies.  07/22/14  Yes Le, Thao P, DO  hydrochlorothiazide (HYDRODIURIL) 25 MG tablet Take 25 mg by mouth daily.   Yes [provider]    lidocaine-prilocaine (EMLA) cream Apply 1 application topically as needed. Patient taking differently: Apply 1 application topically as needed (port access).  06/22/18  Yes [name], MD  metoprolol tartrate (LOPRESSOR) 50 MG tablet Take 50 mg by mouth 2 (two) times daily.   Yes [provider]  morphine (MS CONTIN) 30 MG 12 hr tablet Take 1 tablet (30 mg total) by mouth every 12 (twelve) hours. 10/09/18  Yes [name], MD  ondansetron (ZOFRAN) 8 MG tablet Take 1 tablet (8 mg total) by mouth every 8 (eight) hours as needed for nausea or vomiting. 07/28/18  Yes [name], MD  oxyCODONE (OXY IR/ROXICODONE) 5 MG immediate release tablet Take 1-2 tablets (5-10 mg total) by mouth every 4 (four) hours as needed for severe pain. 10/09/18  Yes [name], MD  bisacodyl (DULCOLAX) 10 MG suppository Place 1 suppository (10 mg total) rectally daily as needed for moderate constipation. Patient not taking: Reported on 10/21/2018 08/05/18   Cristal Ford, DO  ciprofloxacin (CIPRO) 500 MG tablet Take 1 tablet (500  mg total) by mouth 2 (two) times daily. Patient not taking: Reported on 10/21/2018 09/25/18   Harle Stanford., PA-C  furosemide (LASIX) 40 MG tablet Take 1 tablet (40 mg total) by mouth daily for 5 days. 10/21/18 10/26/18  Jacqlyn Larsen, PA-C  hydrOXYzine (VISTARIL) 100 MG capsule TAKE ONE CAPSULE BY MOUTH AT BEDTIME AS NEEDED FOR ALLERGY Patient not taking: Reported on 10/21/2018 10/12/15   Robyn Haber, MD  polyethylene glycol (MIRALAX / Floria Raveling) packet Take 17 g by mouth 2 (two) times daily as needed for mild constipation or moderate constipation. Patient not taking: Reported on 10/21/2018 04/23/18   Orpah Greek, MD  polyethylene glycol powder (MIRALAX) powder Take 255 g by mouth daily. Take 8 cap fulls of miralax in 1 32 oz gatorade over 2-3 hours, then continue to use 1 cap full daily 10/21/18   Jacqlyn Larsen, PA-C  potassium chloride (K-DUR) 10 MEQ tablet Take  1 tablet (10 mEq total) by mouth daily for 5 days. 10/21/18 10/26/18  Jacqlyn Larsen, PA-C    Family History Family History  Problem Relation Age of Onset  . Hypertension Father   . Depression Brother     Social History Social History   Tobacco Use  . Smoking status: Never Smoker  . Smokeless tobacco: Never Used  Substance Use Topics  . Alcohol use: No  . Drug use: Never     Allergies   Pollen extract   Review of Systems Review of Systems  Constitutional: Negative for chills and fever.  HENT: Negative.   Respiratory: Negative for cough and shortness of breath.   Cardiovascular: Positive for leg swelling. Negative for chest pain and palpitations.  Gastrointestinal: Positive for abdominal pain and constipation. Negative for diarrhea, nausea and vomiting.  Genitourinary: Positive for hematuria. Negative for dysuria and frequency.  Musculoskeletal: Positive for myalgias. Negative for arthralgias.  Skin: Negative for color change and rash.  Neurological: Negative for dizziness, syncope and light-headedness.  All other systems reviewed and are negative.    Physical Exam Updated Vital Signs BP 119/76 (BP Location: Right Arm)   Pulse (!) 104   Temp 97.8 F (36.6 C) (Oral)   Resp 18   SpO2 96%   Physical Exam Vitals signs and nursing note reviewed.  Constitutional:      General: He is not in acute distress.    Appearance: He is well-developed. He is not ill-appearing, toxic-appearing or diaphoretic.  HENT:     Head: Normocephalic and atraumatic.  Eyes:     General:        Right eye: No discharge.        Left eye: No discharge.     Pupils: Pupils are equal, round, and reactive to light.  Neck:     Musculoskeletal: Neck supple.  Cardiovascular:     Rate and Rhythm: Normal rate and regular rhythm.     Heart sounds: Normal heart sounds. No murmur. No friction rub. No gallop.   Pulmonary:     Effort: Pulmonary effort is normal. No respiratory distress.     Breath  sounds: Normal breath sounds. No wheezing or rales.     Comments: Respirations equal and unlabored, patient able to speak in full sentences, lungs clear to auscultation bilaterally Abdominal:     General: Abdomen is flat. Bowel sounds are normal. There is no distension.     Palpations: Abdomen is soft. There is no mass.     Tenderness: There is abdominal tenderness in the  right lower quadrant, suprapubic area and left lower quadrant. There is no guarding.     Comments: Abdomen is soft and nondistended, bowel sounds are present throughout, patient has moderate tenderness across the lower abdomen but there is no guarding or rebound tenderness, no CVA tenderness bilaterally  Genitourinary:    Comments: Foley catheter present at the urinary meatus, dark urine noted with some hematuria, but catheter is flowing well, patient without scrotal pain or tenderness. Musculoskeletal:        General: No deformity.     Comments: Bilateral lower extremities with edema up to the level of the thigh, but this is worse on the left than the right, left lower extremity with 3+ pitting edema, right with 2+, no palpable cord  Skin:    General: Skin is warm and dry.     Capillary Refill: Capillary refill takes less than 2 seconds.  Neurological:     Mental Status: He is alert.     Coordination: Coordination normal.     Comments: Speech is clear, able to follow commands Moves extremities without ataxia, coordination intact  Psychiatric:        Mood and Affect: Mood normal.        Behavior: Behavior normal.      ED Treatments / Results  Labs (all labs ordered are listed, but only abnormal results are displayed) Labs Reviewed  COMPREHENSIVE METABOLIC PANEL - Abnormal; Notable for the following components:      Result Value   Potassium 3.4 (*)    Glucose, Bld 106 (*)    Calcium 8.2 (*)    Total Protein 6.3 (*)    Albumin 2.8 (*)    All other components within normal limits  CBC WITH DIFFERENTIAL/PLATELET -  Abnormal; Notable for the following components:   WBC 24.8 (*)    RBC 2.91 (*)    Hemoglobin 7.9 (*)    HCT 26.1 (*)    RDW 21.3 (*)    Platelets 149 (*)    Neutro Abs 14.7 (*)    Basophils Absolute 0.2 (*)    Abs Immature Granulocytes 8.46 (*)    All other components within normal limits  URINALYSIS, ROUTINE W REFLEX MICROSCOPIC - Abnormal; Notable for the following components:   APPearance HAZY (*)    Specific Gravity, Urine 1.036 (*)    Hgb urine dipstick LARGE (*)    Protein, ur 100 (*)    Leukocytes,Ua LARGE (*)    RBC / HPF >50 (*)    WBC, UA >50 (*)    Bacteria, UA RARE (*)    All other components within normal limits  BRAIN NATRIURETIC PEPTIDE - Abnormal; Notable for the following components:   B Natriuretic Peptide 234.6 (*)    All other components within normal limits  URINE CULTURE  LIPASE, BLOOD    EKG None  Radiology Dg Chest 2 View  Result Date: 10/21/2018 CLINICAL DATA:  RIGHT lower quadrant abdominal pain and constipation for 4 days. Bilateral lower extremity edema. EXAM: CHEST - 2 VIEW COMPARISON:  Chest x-rays dated 09/28/2018 and 08/13/2018. FINDINGS: Heart size and mediastinal contours are stable. Lungs are clear. No pleural effusion or pneumothorax seen. RIGHT chest wall Port-A-Cath is stable in position. No acute or suspicious osseous finding. IMPRESSION: No active cardiopulmonary disease. No evidence of pneumonia or pulmonary edema. Electronically Signed   By: Franki Cabot M.D.   On: 10/21/2018 15:11   Ct Abdomen Pelvis W Contrast  Result Date: 10/21/2018 CLINICAL DATA:  52 year old with current history of prostate sarcoma. Most recent chemotherapy was performed on 10/16/2018. Patient underwent ureteral stent exchange 2 days ago. He presents now with acute LOWER abdominal pain, constipation and BILATERAL LOWER extremity edema. EXAM: CT ABDOMEN AND PELVIS WITH CONTRAST TECHNIQUE: Multidetector CT imaging of the abdomen and pelvis was performed using the  standard protocol following bolus administration of intravenous contrast. CONTRAST:  165mL ISOVUE-300 IOPAMIDOL INJECTION 61% IV. COMPARISON:  09/28/2018 and earlier. FINDINGS: Lower chest: Interval slight increase in size of the nodule deep in the LEFT LOWER LOBE adjacent to the diaphragm (series 4, image 11), now measuring 9 mm (previously 6 mm on the examination last month). No new nodules in the visualized lung bases which are otherwise clear. Stable mild cardiomegaly. Hepatobiliary: Stable bilobed cyst involving the ANTERIOR segment RIGHT lobe of liver and stable cyst in the POSTERIOR segment RIGHT lobe inferiorly over multiple prior examinations. No new or enlarging hepatic masses. Gallbladder normal in appearance without calcified gallstones. No biliary ductal dilation. Pancreas: Normal in appearance without evidence of mass, ductal dilation, or inflammation. Spleen: Normal in size and appearance. Adrenals/Urinary Tract: Normal appearing adrenal glands. BILATERAL double-J ureteral stents appropriately positioned with the proximal pigtails in the renal pelves and the distal pigtails in the decompressed urinary bladder. Residual mild to moderate BILATERAL and significant urinary tract obstruction, as there is no contrast in the collecting system on either side on the delayed images. No focal parenchymal abnormality involving either kidney. Urinary bladder decompressed by Foley catheter. Stomach/Bowel: Stomach decompressed and unremarkable. Normal-appearing small bowel. Large stool burden throughout the colon. No focal abnormality involving the colon. Normal appendix in the RIGHT UPPER pelvis. Vascular/Lymphatic: IVC filter with its apex at the level of the LEFT renal vein. No thrombus within the stent or IVC. Mild LEFT femoral artery atherosclerosis. Small nonocclusive filling defect in the LEFT external iliac vein. Stable approximate 1.2 cm RIGHT external iliac node. No pathologic lymphadenopathy elsewhere.  Reproductive: Large necrotic mass involving the prostate gland with maximum measurements approximating 11 x 10 x 11 cm, displacing the urinary bladder far anteriorly and extending to the pelvic sidewalls bilaterally. Other: Mild dependent edema in the subcutaneous tissue of the flanks. Musculoskeletal: Degenerative disc disease at L2-3, L4-5 and L5-S1. No acute findings. No evidence of osseous metastatic disease. Bone island in the LEFT femoral head. IMPRESSION: 1. No new/acute abnormalities involving the abdomen or pelvis. Large colonic stool burden which may indicate constipation. 2. BILATERAL urinary tract obstruction as there is stable moderate to severe BILATERAL hydroureteronephrosis and delayed excretion of contrast by both kidneys, despite appropriately positioned BILATERAL double-J ureteral stents which were changed 2 days ago. 3. Nonocclusive DVT involving the LEFT external iliac vein. 4. Large necrotic prostate mass as noted previously. Stable borderline RIGHT external iliac node. 5. Enlarging nodule involving the deep LEFT LOWER LOBE. Electronically Signed   By: Evangeline Dakin M.D.   On: 10/21/2018 16:25   Vas [place] Lower Extremity Venous (dvt) ([facility] 7a-7p)  Result Date: 10/21/2018  Lower Venous Study Indications: Edema. Urinary retention, Foley placed.  Limitations: Significant edema. Comparison Study: Prior study from 08/15/2018 is available for comparison. Performing Technologist: Sharion Dove RVS  Examination Guidelines: A complete evaluation includes B-mode imaging, spectral Doppler, color Doppler, and power Doppler as needed of all accessible portions of each vessel. Bilateral testing is considered an integral part of a complete examination. Limited examinations for reoccurring indications may be performed as noted.  Right Venous Findings: +---+---------------+---------+-----------+----------+-------+  CompressibilityPhasicitySpontaneityPropertiesSummary  +---+---------------+---------+-----------+----------+-------+ CFVFull           Yes      Yes                          +---+---------------+---------+-----------+----------+-------+  Left Venous Findings: +---------+---------------+---------+-----------+----------+-------+          CompressibilityPhasicitySpontaneityPropertiesSummary +---------+---------------+---------+-----------+----------+-------+ CFV      Full           Yes      Yes                          +---------+---------------+---------+-----------+----------+-------+ SFJ      Full                                                 +---------+---------------+---------+-----------+----------+-------+ FV Prox  Full                                                 +---------+---------------+---------+-----------+----------+-------+ FV Mid   Full                                                 +---------+---------------+---------+-----------+----------+-------+ FV DistalFull                                                 +---------+---------------+---------+-----------+----------+-------+ PFV      Full                                                 +---------+---------------+---------+-----------+----------+-------+ POP      Full           Yes      Yes                          +---------+---------------+---------+-----------+----------+-------+ PTV      Full                                                 +---------+---------------+---------+-----------+----------+-------+ PERO     Full                                                 +---------+---------------+---------+-----------+----------+-------+ GSV      Full                                                 +---------+---------------+---------+-----------+----------+-------+  Summary: Right: No evidence of common femoral vein obstruction. Left: There is no evidence of deep vein thrombosis in the lower extremity. Partial  age indeterminate thrombus noted in the left popliteal in 08/2018, appears resolved Significant interstitial fluid noted throughout.  *See table(s) above for measurements and observations.    Preliminary     Procedures Procedures (including critical care time)  Medications Ordered in ED Medications  ondansetron (ZOFRAN) injection 4 mg (4 mg Intravenous Given 10/21/18 1417)  morphine 4 MG/ML injection 4 mg (4 mg Intravenous Given 10/21/18 1417)  iopamidol (ISOVUE-300) 61 % injection (100 mLs  Contrast Given 10/21/18 1530)  furosemide (LASIX) injection 40 mg (40 mg Intravenous Given 10/21/18 1937)  potassium chloride SA (K-DUR,KLOR-CON) CR tablet 40 mEq (40 mEq Oral Given 10/21/18 1937)  heparin lock flush 100 unit/mL (500 Units Intracatheter Given 10/21/18 1951)     Initial Impression / Assessment and Plan / ED Course  I have reviewed the triage vital signs and the nursing notes.  Pertinent labs & imaging results that were available during my care of the patient were reviewed by me and considered in my medical decision making (see chart for details).  Patient with history of metastatic prostate cancer, presents to the emergency department for evaluation of abdominal pain and constipation as well as worsening lower extremity swelling.  Pain and constipation over the past 4 days, tenderness across the lower abdomen without focal guarding, no signs of surgical abdomen.  Patient has not vomiting and so I have lower suspicion for obstruction but patient does have known pelvic mass that could be causing a blockage or difficulty passing stools.  Patient has urinary catheter in place which is flowing normally, he recently had ureteral stent changed and new catheter put in place on 2/13.  Patient also reporting worsening lower extremity edema, worse on the left than the right, which is increasingly painful, but patient still able to ambulate.  Will check basic abdominal labs, BNP, chest x-ray and CT abdomen  pelvis, morphine and Zofran given for symptomatic management.  Labs significant for a white count of 24.8, 2 days ago this was 40, patient received Neulasta on 2/12 and this is expected response, hemoglobin of 7.9, was 8.12 days ago.  Mild hypokalemia of 3.4, no other acute electrolyte derangements requiring intervention.  Normal kidney and liver function and normal lipase.  BNP is slightly elevated at 234.6, chest x-ray without any evidence of pulmonary edema.  Urinalysis from catheter shows large leukocytes with greater than 50 WBCs and RBCs but only rare bacteria, this is fairly typical for the patient's urinalysis and he just had new stents put in place with a new catheter put in, urine will be cultured but do not feel it is necessary to treat at this point, discussed with Dr. Wilson Singer who is in agreement.  CT Abdomen Pelvis shows:  1. No new/acute abnormalities involving the abdomen or pelvis. Large colonic stool burden which may indicate constipation. 2. BILATERAL urinary tract obstruction as there is stable moderate to severe BILATERAL hydroureteronephrosis and delayed excretion of contrast by both kidneys, despite appropriately positioned BILATERAL double-J ureteral stents which were changed 2 days ago. 3. Nonocclusive DVT involving the LEFT external iliac vein. 4. Large necrotic prostate mass as noted previously. Stable borderline RIGHT external iliac node. 5. Enlarging nodule involving the deep LEFT LOWER LOBE.   Constipation is likely the cause of patient's acute pain the rest of the findings are largely stable.  Given evidence of nonocclusive DVT in  the left external iliac with known history of DVT in this extremity and worsening swelling here will repeat vascular ultrasound to assess for any new or acute DVT.  DVT study shows no evidence of acute DVT and resolved to DVT from December.  Nephric and interstitial fluid noted which is likely the cause of patient's swelling, he has been on Lasix  intermittently in the past but had to stop them due to elevated creatinine but creatinine looks good today, will start patient back on 40 of Lasix and will give potassium replacement.  At this time I feel patient is stable for discharge home we will have him perform MiraLAX cleanout for constipation and then continue with daily MiraLAX.  Patient to follow-up with PCP and oncology.  Return precautions discussed.  Patient expresses understanding and agreement with this plan.   Final Clinical Impressions(s) / ED Diagnoses   Final diagnoses:  Lower abdominal pain  Constipation, unspecified constipation type  Bilateral lower extremity edema    ED Discharge Orders         Ordered    polyethylene glycol powder (MIRALAX) powder  Daily     10/21/18 1954    furosemide (LASIX) 40 MG tablet  Daily     10/21/18 1954    potassium chloride (K-DUR) 10 MEQ tablet  Daily     10/21/18 1954           Janet Berlin 10/22/18 0116    Maudie Flakes, MD 10/25/18 1729

## 2018-10-21 NOTE — Discharge Instructions (Signed)
Your CT scan shows evidence of constipation but no other new changes, please take MiraLAX a capful's in 132 ounce Gatorade over 2 to 3 hours and then continue to use 1 capful of MiraLAX daily to help keep her bowels moving.  Your urine does not look significantly infected today a culture was sent.  Your ultrasound showed no evidence of blood clot in your lower extremity, please begin taking Lasix 40 mg once daily as well as daily potassium supplementation, I would like for you to follow-up with your primary care doctor in the next few days to make sure that this lower extremity edema is improving and so that they can keep a close eye on your kidney function while on this medication.  Elevate legs as much as possible when you are at home.  Return to the emergency department if you have fevers, worsening abdominal pain, blood in your stools, persistent vomiting or unable to keep down fluids, chest pain or shortness of breath or any other new or concerning symptoms.

## 2018-10-21 NOTE — Progress Notes (Signed)
VASCULAR LAB PRELIMINARY  PRELIMINARY  PRELIMINARY  PRELIMINARY  Left lower extremity venous duplex completed.    Preliminary report:  See CV Proc  Gave results to Benedetto Goad, PA-C  Donovan Persley, [facility], RVT 10/21/2018, 6:55 PM

## 2018-10-23 LAB — URINE CULTURE

## 2018-10-25 ENCOUNTER — Emergency Department (HOSPITAL_COMMUNITY)
Admission: EM | Admit: 2018-10-25 | Discharge: 2018-10-25 | Disposition: A | Discharge status: Home / Self Care | Payer: BLUE CROSS/BLUE SHIELD | Attending: Emergency Medicine | Admitting: Emergency Medicine

## 2018-10-25 ENCOUNTER — Other Ambulatory Visit: Payer: Self-pay

## 2018-10-25 ENCOUNTER — Telehealth: Payer: Self-pay

## 2018-10-25 ENCOUNTER — Encounter (HOSPITAL_COMMUNITY): Payer: Self-pay

## 2018-10-25 DIAGNOSIS — K625 Hemorrhage of anus and rectum: Secondary | ICD-10-CM | POA: Diagnosis not present

## 2018-10-25 DIAGNOSIS — R601 Generalized edema: Secondary | ICD-10-CM | POA: Insufficient documentation

## 2018-10-25 DIAGNOSIS — E778 Other disorders of glycoprotein metabolism: Secondary | ICD-10-CM | POA: Insufficient documentation

## 2018-10-25 DIAGNOSIS — N182 Chronic kidney disease, stage 2 (mild): Secondary | ICD-10-CM | POA: Diagnosis not present

## 2018-10-25 DIAGNOSIS — I130 Hypertensive heart and chronic kidney disease with heart failure and stage 1 through stage 4 chronic kidney disease, or unspecified chronic kidney disease: Secondary | ICD-10-CM | POA: Diagnosis not present

## 2018-10-25 DIAGNOSIS — Z79899 Other long term (current) drug therapy: Secondary | ICD-10-CM | POA: Insufficient documentation

## 2018-10-25 DIAGNOSIS — I5032 Chronic diastolic (congestive) heart failure: Secondary | ICD-10-CM | POA: Insufficient documentation

## 2018-10-25 LAB — COMPREHENSIVE METABOLIC PANEL
ALBUMIN: 2.9 g/dL — AB (ref 3.5–5.0)
ALT: 11 U/L (ref 0–44)
ANION GAP: 8 (ref 5–15)
AST: 22 U/L (ref 15–41)
Alkaline Phosphatase: 123 U/L (ref 38–126)
BUN: 21 mg/dL — ABNORMAL HIGH (ref 6–20)
CO2: 27 mmol/L (ref 22–32)
Calcium: 8.1 mg/dL — ABNORMAL LOW (ref 8.9–10.3)
Chloride: 100 mmol/L (ref 98–111)
Creatinine, Ser: 1.58 mg/dL — ABNORMAL HIGH (ref 0.61–1.24)
GFR calc Af Amer: 57 mL/min — ABNORMAL LOW (ref >60)
GFR calc non Af Amer: 50 mL/min — ABNORMAL LOW (ref >60)
Glucose, Bld: 135 mg/dL — ABNORMAL HIGH (ref 70–99)
Potassium: 3.5 mmol/L (ref 3.5–5.1)
SODIUM: 135 mmol/L (ref 135–145)
Total Bilirubin: 0.5 mg/dL (ref 0.3–1.2)
Total Protein: 6.5 g/dL (ref 6.5–8.1)

## 2018-10-25 LAB — CBC
HCT: 27 % — ABNORMAL LOW (ref 39.0–52.0)
Hemoglobin: 8.1 g/dL — ABNORMAL LOW (ref 13.0–17.0)
MCH: 27.3 pg (ref 26.0–34.0)
MCHC: 30 g/dL (ref 30.0–36.0)
MCV: 90.9 fL (ref 80.0–100.0)
Platelets: 153 10*3/uL (ref 150–400)
RBC: 2.97 MIL/uL — ABNORMAL LOW (ref 4.22–5.81)
RDW: 22.7 % — ABNORMAL HIGH (ref 11.5–15.5)
WBC: 51.7 10*3/uL — AB (ref 4.0–10.5)
nRBC: 4.3 % — ABNORMAL HIGH (ref 0.0–0.2)

## 2018-10-25 LAB — TYPE AND SCREEN
ABO/RH(D): O POS
ANTIBODY SCREEN: NEGATIVE

## 2018-10-25 MED ORDER — OXYCODONE HCL 5 MG PO TABS
5.0000 mg | ORAL_TABLET | Freq: Once | ORAL | Status: AC
  Administered 2018-10-25: 5 mg via ORAL
  Filled 2018-10-25: qty 1

## 2018-10-25 MED ORDER — HEPARIN SOD (PORK) LOCK FLUSH 100 UNIT/ML IV SOLN
500.0000 [IU] | Freq: Once | INTRAVENOUS | Status: AC
  Administered 2018-10-25: 500 [IU]
  Filled 2018-10-25: qty 5

## 2018-10-25 NOTE — ED Notes (Signed)
Bed: WA10 Expected date:  Expected time:  Means of arrival:  Comments: Hold for triage 2 

## 2018-10-25 NOTE — Telephone Encounter (Signed)
Received call from patient spouse stating that patient has been constipated and taking Miralax. She then stated that the patient had a BM and the toilet "was filled with bright red blood". She stated that the patient is not feeling right and she is concerned about "all the blood that came out". This RN instructed the spouse to send the patient to the ED to be evaluated. Dionne verbalized understanding.

## 2018-10-25 NOTE — ED Notes (Signed)
Patient request lab draw from port.

## 2018-10-25 NOTE — ED Notes (Signed)
Pt given education regarding pain medication, pt is being driven home by family.

## 2018-10-25 NOTE — ED Notes (Signed)
Patient wanted to  Wait to have blood drawn when his port is accessed.

## 2018-10-25 NOTE — ED Notes (Signed)
Date and time results received: 10/25/18 16:44  Test: WBC  Critical Value: 51.7  Name of Provider Notified: Margarita Mail, PA   Orders Received? Or Actions Taken?: Will continue to monitor and await new orders.

## 2018-10-25 NOTE — Discharge Instructions (Addendum)
Contact a health care provider if: Your edema does not get better with treatment. You have heart, liver, or kidney disease and have symptoms of edema. You have sudden and unexplained weight gain. Get help right away if: You develop shortness of breath or chest pain. You cannot breathe when you lie down. You develop pain, redness, or warmth in the swollen areas. You have heart, liver, or kidney disease and suddenly get edema. You have a fever and your symptoms suddenly get worse.

## 2018-10-25 NOTE — ED Provider Notes (Signed)
[facility] DEPT Provider Note   CSN: [id number] Arrival date & time: 10/25/18  1356    History   Chief Complaint Chief Complaint  Patient presents with  . Rectal Bleeding  . Leg Swelling  . cancer patient    HPI Dylan Gonzalez is a 53 y.o. male. Who presents emergency department chief complaint of rectal bleeding and leg swelling.  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  He has a chronic indwelling Foley catheter that was changed 4 days ago.  Patient states that he deals with constipation because he is on chronic pain medication.  He did not make a bowel movement for several days and then this morning had a fairly large bowel movement that was followed by thick bright red blood from his rectum.  He has never had that in the past.  He had only one episode and has not had any more bleeding from his bottom.  He denies rectal pain or urgency, dizziness, shortness of breath.  Patient also complaining of significantly increased lower extremity edema.  He was seen last week for the same symptoms and started on Lasix.  He has been taking the Lasix and elevating his legs without relief of his symptoms.  He denies any problems with his kidney.  Review of his screening labs today shows significant hypoproteinemia and hypoalbuminemia.  His kidney function appears normal.  Patient has a previous history of DVT but his swelling is equal on both legs he has not taking any blood thinners.  He denies chest pain, shortness of breath, fevers or chills.    HPI  Past Medical History:  Diagnosis Date  . Bilateral lower extremity edema   . Chronic diastolic congestive heart failure ([place]) 07/2018  . CKD (chronic kidney disease), stage II   . DOE (dyspnea on exertion)   . Foley catheter in place   . Hematuria   . History of CVA (cerebrovascular accident) 2007   per pt no residuals  . History of DVT of lower extremity 06/2018   left common  femoral vein,  started on anticoagulant , stopped due to hematuria,  IVC filter placed 08-16-2018  . History of sepsis 09/28/2018   uti  . Hypertension   . Malignant neoplasm metastatic to lung ([place]) 04/19/2018  . Port-A-Cath in place 06/21/2018  . 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 S/P IVC filter 08/16/2018  . Urinary retention 09/28/2018   CHRONIC FOLEY CATH    Patient Active Problem List   Diagnosis Date Noted  . Abdominal pain 09/28/2018  . Sepsis ([place]) 09/28/2018  . Anemia 08/14/2018  . Symptomatic anemia 08/13/2018  . Acute UTI (urinary tract infection) 08/02/2018  . Deep vein thrombosis (DVT) of left lower extremity ([place]) 08/02/2018  . Bilateral lower extremity edema 08/01/2018  . CAD (coronary artery disease) 08/01/2018  . Hypokalemia 08/01/2018  . Port-A-Cath in place 06/29/2018  . Lung nodules 05/24/2018  . Leiomyosarcoma ([place]) 05/23/2018  . Goals of care, counseling/discussion 05/15/2018  . Bilateral ureteral obstruction 04/19/2018  . Malignant neoplasm metastatic to lung ([place]) 04/19/2018  . Primary leiomyosarcoma of intra-abdominal site ([place]) 04/18/2018  . Prostate neoplasm 04/13/2018  . Hypertension 12/26/2011  . 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 CYSTOSCOPY W/ URETERAL STENT PLACEMENT Bilateral 04/13/2018   Procedure: CYSTOSCOPY WITH BILATERAL STENT REPLACEMENT;  Surgeon: Irine Seal, MD;  Location: [facility];  Service: Urology;  Laterality: Bilateral;  . CYSTOSCOPY WITH STENT PLACEMENT Bilateral 10/19/2018   Procedure: CYSTOSCOPY WITH STENT EXCHANGE Bilateral;  Surgeon: Irine Seal, MD;  Location: [facility];  Service: Urology;  Laterality: Bilateral;  . ENUCLEATION Left 1976    removal left eye due to Retinoplastoma  . INTRAOCULAR PROSTHESES INSERTION    . IR IMAGING GUIDED PORT INSERTION  06/21/2018  . IR IVC FILTER PLMT / S&I /IMG GUID/MOD SED  08/16/2018  . LAPAROSCOPIC INGUINAL HERNIA REPAIR Right 11-19-2010   dr d. Ninfa Linden  @[facility]    AND UMBILICAL HERNIA REPAIR  . PROSTATE BIOPSY [name] 04/13/2018   Procedure: ULTRASOUND GUIDED PROSTATE BIOPSY;  Surgeon: Irine Seal, MD;  Location: [facility];  Service: Urology;  Laterality: [name];  . TOOTH EXTRACTION Right 04/21/2013   Procedure: EXTRACTION MOLARS;  Surgeon: [name], DDS;  Location: [facility];  Service: Oral Surgery;  Laterality: Right;  . TRANSURETHRAL RESECTION OF PROSTATE  04/13/2018   Procedure: TRANSURETHRAL RESECTION OF THE PROSTATE (TURP);  Surgeon: Irine Seal, MD;  Location: [facility];  Service: Urology;;        Home Medications    Prior to Admission medications   Medication Sig Start Date End Date Taking? Authorizing Provider  acetaminophen (TYLENOL) 500 MG tablet Take 1,000 mg by mouth every 6 (six) hours as needed for fever.    [provider]  albuterol (PROVENTIL HFA) 108 (90 Base) MCG/ACT inhaler Inhale 1-2 puffs into the lungs every 6 (six) hours as needed for wheezing or shortness of breath.    [provider]  bisacodyl (DULCOLAX) 10 MG suppository Place 1 suppository (10 mg total) rectally daily as needed for moderate constipation. Patient not taking: Reported on 10/21/2018 08/05/18   Cristal Ford, DO  ciprofloxacin (CIPRO) 500 MG tablet Take 1 tablet (500 mg total) by mouth 2 (two) times daily. Patient not taking: Reported on 10/21/2018 09/25/18   Harle Stanford., PA-C  docusate sodium (COLACE) 100 MG capsule Take 100 mg by mouth daily as needed for mild constipation.    [provider]  escitalopram (LEXAPRO) 10 MG tablet Take 10 mg by mouth daily. 09/26/18   [provider]  fluticasone (FLONASE) 50 MCG/ACT nasal spray PLACE 2 SPRAYS INTO BOTH NOSTRILS  DAILY. Patient taking differently: Place 2 sprays into both nostrils daily as needed for allergies.  07/22/14   Le, Thao P, DO  furosemide (LASIX) 40 MG tablet Take 1 tablet (40 mg total) by mouth daily for 5 days. 10/21/18 10/26/18  Jacqlyn Larsen, PA-C  hydrochlorothiazide (HYDRODIURIL) 25 MG tablet Take 25 mg by mouth daily.    [provider]  hydrOXYzine (VISTARIL) 100 MG capsule TAKE ONE CAPSULE BY MOUTH AT BEDTIME AS NEEDED FOR ALLERGY Patient not taking: Reported on 10/21/2018 10/12/15   Robyn Haber, MD  lidocaine-prilocaine (EMLA) cream Apply 1 application topically as needed. Patient taking differently: Apply 1 application topically as needed (port access).  06/22/18   [name], MD  metoprolol tartrate (LOPRESSOR) 50 MG tablet Take 50 mg by mouth 2 (two) times daily.    [provider]  morphine (MS CONTIN) 30 MG 12 hr tablet Take 1 tablet (30 mg total) by mouth every 12 (twelve) hours. 10/09/18   [name], MD  ondansetron (ZOFRAN) 8 MG  tablet Take 1 tablet (8 mg total) by mouth every 8 (eight) hours as needed for nausea or vomiting. 07/28/18   [name], MD  oxyCODONE (OXY IR/ROXICODONE) 5 MG immediate release tablet Take 1-2 tablets (5-10 mg total) by mouth every 4 (four) hours as needed for severe pain. 10/09/18   [name], MD  polyethylene glycol (MIRALAX / GLYCOLAX) packet Take 17 g by mouth 2 (two) times daily as needed for mild constipation or moderate constipation. Patient not taking: Reported on 10/21/2018 04/23/18   Orpah Greek, MD  polyethylene glycol powder (MIRALAX) powder Take 255 g by mouth daily. Take 8 cap fulls of miralax in 1 32 oz gatorade over 2-3 hours, then continue to use 1 cap full daily 10/21/18   Jacqlyn Larsen, PA-C  potassium chloride (K-DUR) 10 MEQ tablet Take 1 tablet (10 mEq total) by mouth daily for 5 days. 10/21/18 10/26/18  Jacqlyn Larsen, PA-C    Family History Family History  Problem Relation Age of  Onset  . Hypertension Father   . Depression Brother     Social History Social History   Tobacco Use  . Smoking status: Never Smoker  . Smokeless tobacco: Never Used  Substance Use Topics  . Alcohol use: No  . Drug use: Never     Allergies   Pollen extract   Review of Systems Review of Systems  Ten systems reviewed and are negative for acute change, except as noted in the HPI.   Physical Exam Updated Vital Signs BP 100/69 (BP Location: Right Arm)   Pulse 91   Temp 97.8 F (36.6 C) (Oral)   Resp 18   Ht 5\' 11"  (1.803 m)   Wt 93.9 kg   SpO2 97%   BMI 28.87 kg/m   Physical Exam Vitals signs and nursing note reviewed.  Constitutional:      General: He is not in acute distress.    Appearance: He is well-developed. He is not diaphoretic.     Comments: Appears nontoxic, and chronically ill.  HENT:     Head: Normocephalic and atraumatic.  Eyes:     General: No scleral icterus.    Conjunctiva/sclera: Conjunctivae normal.  Neck:     Musculoskeletal: Normal range of motion and neck supple.  Cardiovascular:     Rate and Rhythm: Normal rate and regular rhythm.     Heart sounds: Normal heart sounds.  Pulmonary:     Effort: Pulmonary effort is normal. No respiratory distress.     Breath sounds: Normal breath sounds.  Abdominal:     Palpations: Abdomen is soft.     Tenderness: There is no abdominal tenderness.  Genitourinary:    Comments: .Digital Rectal Exam reveals sphincter with good tone. No external hemorrhoids. No masses or fissures. Stool color is brown with no overt blood.  Musculoskeletal:     Right lower leg: Edema present.     Left lower leg: Edema present.     Comments: Anasarca of the bilateral lower extremities up to the superior thighs.  3+ pitting edema in the lower extremities.  Skin:    General: Skin is warm and dry.  Neurological:     Mental Status: He is alert.  Psychiatric:        Behavior: Behavior normal.      ED Treatments / Results   Labs (all labs ordered are listed, but only abnormal results are displayed) Labs Reviewed  COMPREHENSIVE METABOLIC PANEL  CBC  POC OCCULT BLOOD,  ED  TYPE AND SCREEN    EKG None  Radiology No results found.  Procedures Procedures (including critical care time)  Medications Ordered in ED Medications - No data to display   Initial Impression / Assessment and Plan / ED Course  I have reviewed the triage vital signs and the nursing notes.  Pertinent labs & imaging results that were available during my care of the patient were reviewed by me and considered in my medical decision making (see chart for details).        Patient with end-stage metastatic leiomyosarcoma sarcoma/prostate cancer.  His wife states that he has hospice resources coming to the house for consult and placement this coming week.  I had the patient placed in gradient compression to counteract the anasarca in his legs which is causing great difficulty for him to walk.  The patient has chronic pain control.  Discussed increasing his MiraLAX use and adding Colace.  He is advised to follow closely with his PCP.  I discussed the case with Dr. Walden Field of oncology regarding the patient's highly elevated white blood cell count.  Patient did have a Neulasta shot on the 12th and this is very likely the cause.  Doubt sepsis.  Patient had a differential 4 days ago which did not show any abnormal blood cells.  Patient wants to go home and appears appropriate for discharge.  I did consider admission for transitional care to hospice however ultimately patient decided he would like to go home.  Patient appears appropriate for discharge at this time.  Discussed return precautions  Final Clinical Impressions(s) / ED Diagnoses   Final diagnoses:  None    ED Discharge Orders    None       Margarita Mail, PA-C 10/26/18 0015    Dorie Rank, MD 10/28/18 [phone]

## 2018-10-26 ENCOUNTER — Telehealth: Payer: Self-pay

## 2018-10-26 NOTE — Telephone Encounter (Signed)
Received call from the patient spouse stating that she "thinks" she and the patient are ready for hospice services because she needs more help with the patient. Explained the services that would be provided by hospice and that 24/7 care is not provided. Also explained that Javaughn was not admitted to hospice recently because he wanted to pursue chemotherapy. Explained that if the patient is ready to stop chemotherapy this RN can make the hospice referral. Dionne stated that they are not sure. She stated that the patient has appointments on Monday and she is going to come with the patient and have a discussion with Dr. Alen Blew. Explained that if they decide before Monday to stop the treatments then a referral can be made. Dionne was appreciative of the information and knows to call back with any questions or concerns.

## 2018-10-30 ENCOUNTER — Inpatient Hospital Stay: Payer: BLUE CROSS/BLUE SHIELD

## 2018-10-30 ENCOUNTER — Telehealth: Payer: Self-pay | Admitting: Oncology

## 2018-10-30 ENCOUNTER — Telehealth: Payer: Self-pay | Admitting: *Deleted

## 2018-10-30 ENCOUNTER — Inpatient Hospital Stay (HOSPITAL_BASED_OUTPATIENT_CLINIC_OR_DEPARTMENT_OTHER): Payer: BLUE CROSS/BLUE SHIELD | Admitting: Oncology

## 2018-10-30 VITALS — BP 109/70 | HR 104 | Temp 98.6°F | Resp 18 | Ht 71.0 in | Wt 224.1 lb

## 2018-10-30 DIAGNOSIS — Z5111 Encounter for antineoplastic chemotherapy: Secondary | ICD-10-CM | POA: Diagnosis not present

## 2018-10-30 DIAGNOSIS — N139 Obstructive and reflux uropathy, unspecified: Secondary | ICD-10-CM

## 2018-10-30 DIAGNOSIS — D6481 Anemia due to antineoplastic chemotherapy: Secondary | ICD-10-CM

## 2018-10-30 DIAGNOSIS — Z86718 Personal history of other venous thrombosis and embolism: Secondary | ICD-10-CM

## 2018-10-30 DIAGNOSIS — C7800 Secondary malignant neoplasm of unspecified lung: Secondary | ICD-10-CM | POA: Diagnosis not present

## 2018-10-30 DIAGNOSIS — Z5189 Encounter for other specified aftercare: Secondary | ICD-10-CM | POA: Diagnosis not present

## 2018-10-30 DIAGNOSIS — D630 Anemia in neoplastic disease: Secondary | ICD-10-CM

## 2018-10-30 DIAGNOSIS — R102 Pelvic and perineal pain: Secondary | ICD-10-CM

## 2018-10-30 DIAGNOSIS — C61 Malignant neoplasm of prostate: Secondary | ICD-10-CM | POA: Diagnosis not present

## 2018-10-30 DIAGNOSIS — R600 Localized edema: Secondary | ICD-10-CM

## 2018-10-30 DIAGNOSIS — C494 Malignant neoplasm of connective and soft tissue of abdomen: Secondary | ICD-10-CM

## 2018-10-30 DIAGNOSIS — Z95828 Presence of other vascular implants and grafts: Secondary | ICD-10-CM

## 2018-10-30 LAB — CMP (CANCER CENTER ONLY)
ALT: 8 U/L (ref 0–44)
AST: 22 U/L (ref 15–41)
Albumin: 2.2 g/dL — ABNORMAL LOW (ref 3.5–5.0)
Alkaline Phosphatase: 105 U/L (ref 38–126)
Anion gap: 10 (ref 5–15)
BUN: 16 mg/dL (ref 6–20)
CALCIUM: 7.9 mg/dL — AB (ref 8.9–10.3)
CO2: 27 mmol/L (ref 22–32)
CREATININE: 1.43 mg/dL — AB (ref 0.61–1.24)
Chloride: 100 mmol/L (ref 98–111)
GFR, Est AFR Am: >60 mL/min (ref >60)
GFR, Estimated: 56 mL/min — ABNORMAL LOW (ref >60)
Glucose, Bld: 142 mg/dL — ABNORMAL HIGH (ref 70–99)
Potassium: 4.1 mmol/L (ref 3.5–5.1)
Sodium: 137 mmol/L (ref 135–145)
Total Bilirubin: 0.5 mg/dL (ref 0.3–1.2)
Total Protein: 6.1 g/dL — ABNORMAL LOW (ref 6.5–8.1)

## 2018-10-30 LAB — CBC WITH DIFFERENTIAL (CANCER CENTER ONLY)
Abs Immature Granulocytes: 1.42 10*3/uL — ABNORMAL HIGH (ref 0.00–0.07)
Basophils Absolute: 0.2 10*3/uL — ABNORMAL HIGH (ref 0.0–0.1)
Basophils Relative: 0 %
Eosinophils Absolute: 0.1 10*3/uL (ref 0.0–0.5)
Eosinophils Relative: 0 %
HCT: 26.5 % — ABNORMAL LOW (ref 39.0–52.0)
Hemoglobin: 8.1 g/dL — ABNORMAL LOW (ref 13.0–17.0)
IMMATURE GRANULOCYTES: 3 %
Lymphocytes Relative: 4 %
Lymphs Abs: 1.9 10*3/uL (ref 0.7–4.0)
MCH: 26.7 pg (ref 26.0–34.0)
MCHC: 30.6 g/dL (ref 30.0–36.0)
MCV: 87.5 fL (ref 80.0–100.0)
Monocytes Absolute: 4.8 10*3/uL — ABNORMAL HIGH (ref 0.1–1.0)
Monocytes Relative: 11 %
Neutro Abs: 36.4 10*3/uL — ABNORMAL HIGH (ref 1.7–7.7)
Neutrophils Relative %: 82 %
Platelet Count: 494 10*3/uL — ABNORMAL HIGH (ref 150–400)
RBC: 3.03 MIL/uL — ABNORMAL LOW (ref 4.22–5.81)
RDW: 23.5 % — ABNORMAL HIGH (ref 11.5–15.5)
WBC Count: 44.7 10*3/uL — ABNORMAL HIGH (ref 4.0–10.5)
nRBC: 1.2 % — ABNORMAL HIGH (ref 0.0–0.2)

## 2018-10-30 MED ORDER — OXYCODONE HCL 5 MG PO TABS: 5.0000 mg | ORAL_TABLET | ORAL | 0 refills | Status: DC | PRN

## 2018-10-30 MED ORDER — MORPHINE SULFATE ER 30 MG PO TBCR: 30.0000 mg | EXTENDED_RELEASE_TABLET | Freq: Two times a day (BID) | ORAL | 0 refills | Status: DC

## 2018-10-30 MED ORDER — SODIUM CHLORIDE 0.9% FLUSH
10.0000 mL | Freq: Once | INTRAVENOUS | Status: AC
  Administered 2018-10-30: 10 mL
  Filled 2018-10-30: qty 10

## 2018-10-30 NOTE — Telephone Encounter (Signed)
Scheduled per los, mailed printout

## 2018-10-30 NOTE — Progress Notes (Signed)
Hematology and Oncology Follow Up Visit  Dylan Gonzalez [id number] 09/24/1965 53 y.o. 10/30/2018 9:32 AM [name], MDSun, [name], MD   Principle Diagnosis: 53 year old man with advanced high-grade leiomyosarcoma diagnosed in August 2019.  Primaries originating from the prostate and documented pulmonary metastasis.  Prior Therapy: He is S/P transrectal prostate ultrasound and a prostate biopsy as well as a cystoscopy with insertion of double-J stent and transurethral resection of the prostate completed by Dr. Jeffie Pollock on April 13, 2018.  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  Immunohistochemical stains showed positive for desmin, smooth muscle actin, muscle specific actin and negative for PSA.  He was also negative for cytokeratin 7, 903, CD117 and CD34.     Current therapy: Taxotere with gemcitabine started on 05/30/2018.  He is status post 6 cycles of therapy.  Interim History: [name] is here for a repeat evaluation.  Since the last visit, he continues to struggle with multitude of complaints.  He is experiencing lower extremity edema, fatigue and increased pain and discomfort.  He was seen in the emergency department most recent early on October 25, 2018 for lower extremity edema which has improved since the last visit.  He continues to have decline in his performance status and worsening overall appetite and fatigue.  He has tolerated morphine with improvement in his overall pain and better control with long-acting pain medication.  His appetite has been poor and continues to decline overall.  Does report some mild dyspnea on exertion and overall deconditioning.  Patient denied headaches, blurry vision, syncope or seizures.  Denies any fevers, chills or sweats.  Denied chest pain, palpitation, orthopnea or leg edema.  Denied cough, wheezing or hemoptysis.  Denied nausea, vomiting or  abdominal pain.  Denies any constipation or diarrhea.  Denies any frequency urgency or hesitancy.  Denies any arthralgias or myalgias.  Denies any skin rashes or lesions.  Denies any bleeding or clotting tendency.  Denies any easy bruising.  Denies any hair or nail changes.  Denies any anxiety or depression.  Remaining review of system is negative.      Medications: I have reviewed the patient's current medications.  Current Outpatient Medications  Medication Sig Dispense Refill  . acetaminophen (TYLENOL) 500 MG tablet Take 1,000 mg by mouth 2 (two) times daily as needed for mild pain, fever or headache.     . albuterol (PROVENTIL HFA) 108 (90 Base) MCG/ACT inhaler Inhale 1-2 puffs into the lungs every 6 (six) hours as needed for wheezing or shortness of breath.    . bisacodyl (DULCOLAX) 10 MG suppository Place 1 suppository (10 mg total) rectally daily as needed for moderate constipation. (Patient not taking: Reported on 10/21/2018) 12 suppository 0  . ciprofloxacin (CIPRO) 500 MG tablet Take 1 tablet (500 mg total) by mouth 2 (two) times daily. (Patient not taking: Reported on 10/21/2018) 10 tablet 0  . docusate sodium (COLACE) 100 MG capsule Take 100 mg by mouth daily as needed for mild constipation.    Marland Kitchen escitalopram (LEXAPRO) 10 MG tablet Take 10 mg by mouth daily.    . fluticasone (FLONASE) 50 MCG/ACT nasal spray PLACE 2 SPRAYS INTO BOTH NOSTRILS DAILY. (Patient not taking: No sig reported) 16 g 9  . furosemide (LASIX) 40 MG tablet Take 1 tablet (40 mg total) by mouth daily for 5 days. 5 tablet 0  . hydrochlorothiazide (HYDRODIURIL) 25 MG tablet Take 25 mg  by mouth daily.    . hydrOXYzine (VISTARIL) 100 MG capsule TAKE ONE CAPSULE BY MOUTH AT BEDTIME AS NEEDED FOR ALLERGY (Patient not taking: Reported on 10/21/2018) 30 capsule 5  . lidocaine-prilocaine (EMLA) cream Apply 1 application topically as needed. (Patient taking differently: Apply 1 application topically as needed (port access). ) 30 g  0  . metoprolol tartrate (LOPRESSOR) 50 MG tablet Take 50 mg by mouth daily.     Marland Kitchen morphine (MS CONTIN) 30 MG 12 hr tablet Take 1 tablet (30 mg total) by mouth every 12 (twelve) hours. 60 tablet 0  . ondansetron (ZOFRAN) 8 MG tablet Take 1 tablet (8 mg total) by mouth every 8 (eight) hours as needed for nausea or vomiting. (Patient not taking: Reported on 10/25/2018) 20 tablet 0  . oxyCODONE (OXY IR/ROXICODONE) 5 MG immediate release tablet Take 1-2 tablets (5-10 mg total) by mouth every 4 (four) hours as needed for severe pain. 90 tablet 0  . polyethylene glycol (MIRALAX / GLYCOLAX) packet Take 17 g by mouth 2 (two) times daily as needed for mild constipation or moderate constipation. (Patient not taking: Reported on 10/21/2018) 14 each 0  . polyethylene glycol powder (MIRALAX) powder Take 255 g by mouth daily. Take 8 cap fulls of miralax in 1 32 oz gatorade over 2-3 hours, then continue to use 1 cap full daily (Patient taking differently: Take 17 g by mouth daily. 1 cap full once a day) 255 g 0  . potassium chloride (K-DUR) 10 MEQ tablet Take 1 tablet (10 mEq total) by mouth daily for 5 days. 5 tablet 0   No current facility-administered medications for this visit.      Allergies:  Allergies  Allergen Reactions  . Pollen Extract Other (See Comments)    Sneezing and shortness of breath    Past Medical History, Surgical history, Social history, and Family History were reviewed and updated.    Physical Exam:    Blood pressure 109/70, pulse (!) 104, temperature 98.6 F (37 C), temperature source Oral, resp. rate 18, height _0  (1.803 m), weight 224 lb 1.6 oz (101.7 kg), SpO2 90 %.     ECOG: 1   General appearance: Alert, awake without any distress. Head: Atraumatic without abnormalities Oropharynx: Without any thrush or ulcers. Eyes: No scleral icterus. Lymph nodes: No lymphadenopathy noted in the cervical, supraclavicular, or axillary nodes Heart:regular rate and rhythm,  without any murmurs or gallops.   Lung: Clear to auscultation without any rhonchi, wheezes or dullness to percussion. Abdomin: Soft, nontender without any shifting dullness or ascites. Musculoskeletal: No clubbing or cyanosis. Neurological: No motor or sensory deficits. Skin: No rashes or lesions.            Lab Results: Lab Results  Component Value Date   WBC 51.7 (HH) 10/25/2018   HGB 8.1 (L) 10/25/2018   HCT 27.0 (L) 10/25/2018   MCV 90.9 10/25/2018   PLT 153 10/25/2018     Chemistry      Component Value Date/Time   NA 135 10/25/2018 1412   K 3.5 10/25/2018 1412   CL 100 10/25/2018 1412   CO2 27 10/25/2018 1412   BUN 21 (H) 10/25/2018 1412   CREATININE 1.58 (H) 10/25/2018 1412   CREATININE 0.96 10/16/2018 0950   CREATININE 1.07 06/01/2014 1257      Component Value Date/Time   CALCIUM 8.1 (L) 10/25/2018 1412   ALKPHOS 123 10/25/2018 1412   AST 22 10/25/2018 1412   AST 19 10/16/2018 0950  ALT 11 10/25/2018 1412   ALT 12 10/16/2018 0950   BILITOT 0.5 10/25/2018 1412   BILITOT 0.5 10/16/2018 0950         Impression and Plan:  53 year old man with:  1.    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  He was found to have high-grade leiomyosarcoma with pulmonary metastasis.   He completed 6 cycles of chemotherapy utilizing gemcitabine and Taxotere with significant toxicities as well as overall decline in his overall health.  Risks and benefits of continuing chemotherapy versus proceeding with supportive care and hospice care was discussed today.  After discussion today, we have opted to discontinue all chemotherapy and proceed with hospice.   2.  IV access: Port-A-Cath remains in place and in use as needed.  This will be flushed periodically.  3.  Left common femoral vein DVT: IVC filter in place without any recent bleeding.  4.  Urinary obstruction: Related to his tumor and a Foley catheter remains in  place.  5.  Pelvic pain: He was started on morphine and breakthrough oxycodone as needed.  6.  Lower extremity edema: Related to Taxotere chemotherapy.  Improved at this time and anticipate improvement after discontinuation of chemotherapy.  7.  Anemia: Mildly symptomatic from that.  His anemia is related to chemotherapy.  His hemoglobin is stable and does not require any transfusion.  8.  Prognosis and goals of care: Disease is incurable and his performance status is declining.  His prognosis is poor with limited life expectancy and is hospice eligible.  9.  Follow-up: 3 to 4 weeks follow his progress.  25 minutes was spent with the patient face-to-face today.  More than 50% of time was dedicated to discussing the natural course of his disease, treatment options, discussing prognosis and future plan of care.    [name], MD 2/24/20209:32 AM

## 2018-10-30 NOTE — Telephone Encounter (Signed)
Spoke with nurse jen at [facility] . Referral made for hospice, hospice physicians to do symptom management and have DNR signed, per families wishes. They will  Call today and set up  nursing assessment for needs in the home.

## 2018-10-31 DIAGNOSIS — C61 Malignant neoplasm of prostate: Secondary | ICD-10-CM | POA: Diagnosis not present

## 2018-11-01 DIAGNOSIS — C61 Malignant neoplasm of prostate: Secondary | ICD-10-CM | POA: Diagnosis not present

## 2018-11-02 DIAGNOSIS — C61 Malignant neoplasm of prostate: Secondary | ICD-10-CM | POA: Diagnosis not present

## 2018-11-03 DIAGNOSIS — C61 Malignant neoplasm of prostate: Secondary | ICD-10-CM | POA: Diagnosis not present

## 2018-11-04 DIAGNOSIS — C61 Malignant neoplasm of prostate: Secondary | ICD-10-CM | POA: Diagnosis not present

## 2018-11-04 DIAGNOSIS — C7800 Secondary malignant neoplasm of unspecified lung: Secondary | ICD-10-CM | POA: Diagnosis not present

## 2018-11-05 DIAGNOSIS — D4959 Neoplasm of unspecified behavior of other genitourinary organ: Secondary | ICD-10-CM | POA: Diagnosis not present

## 2018-11-05 DIAGNOSIS — R339 Retention of urine, unspecified: Secondary | ICD-10-CM | POA: Diagnosis not present

## 2018-11-05 DIAGNOSIS — C61 Malignant neoplasm of prostate: Secondary | ICD-10-CM | POA: Diagnosis not present

## 2018-11-05 DIAGNOSIS — C7800 Secondary malignant neoplasm of unspecified lung: Secondary | ICD-10-CM | POA: Diagnosis not present

## 2018-11-06 ENCOUNTER — Ambulatory Visit: Payer: BLUE CROSS/BLUE SHIELD

## 2018-11-06 ENCOUNTER — Other Ambulatory Visit: Payer: BLUE CROSS/BLUE SHIELD

## 2018-11-08 ENCOUNTER — Ambulatory Visit: Payer: BLUE CROSS/BLUE SHIELD

## 2018-11-10 ENCOUNTER — Encounter (HOSPITAL_COMMUNITY): Payer: Self-pay

## 2018-11-10 ENCOUNTER — Emergency Department (HOSPITAL_COMMUNITY)
Admission: EM | Admit: 2018-11-10 | Discharge: 2018-11-10 | Disposition: A | Discharge status: Home / Self Care | Payer: BLUE CROSS/BLUE SHIELD | Attending: Emergency Medicine | Admitting: Emergency Medicine

## 2018-11-10 ENCOUNTER — Other Ambulatory Visit: Payer: Self-pay

## 2018-11-10 ENCOUNTER — Emergency Department (HOSPITAL_COMMUNITY): Payer: BLUE CROSS/BLUE SHIELD

## 2018-11-10 DIAGNOSIS — Z85118 Personal history of other malignant neoplasm of bronchus and lung: Secondary | ICD-10-CM | POA: Diagnosis not present

## 2018-11-10 DIAGNOSIS — R2243 Localized swelling, mass and lump, lower limb, bilateral: Secondary | ICD-10-CM | POA: Diagnosis not present

## 2018-11-10 DIAGNOSIS — R600 Localized edema: Secondary | ICD-10-CM | POA: Insufficient documentation

## 2018-11-10 DIAGNOSIS — I130 Hypertensive heart and chronic kidney disease with heart failure and stage 1 through stage 4 chronic kidney disease, or unspecified chronic kidney disease: Secondary | ICD-10-CM | POA: Insufficient documentation

## 2018-11-10 DIAGNOSIS — N182 Chronic kidney disease, stage 2 (mild): Secondary | ICD-10-CM | POA: Diagnosis not present

## 2018-11-10 DIAGNOSIS — R609 Edema, unspecified: Secondary | ICD-10-CM | POA: Diagnosis not present

## 2018-11-10 DIAGNOSIS — Z8673 Personal history of transient ischemic attack (TIA), and cerebral infarction without residual deficits: Secondary | ICD-10-CM | POA: Diagnosis not present

## 2018-11-10 DIAGNOSIS — Z85831 Personal history of malignant neoplasm of soft tissue: Secondary | ICD-10-CM | POA: Insufficient documentation

## 2018-11-10 DIAGNOSIS — Z79899 Other long term (current) drug therapy: Secondary | ICD-10-CM | POA: Diagnosis not present

## 2018-11-10 DIAGNOSIS — R0989 Other specified symptoms and signs involving the circulatory and respiratory systems: Secondary | ICD-10-CM | POA: Diagnosis not present

## 2018-11-10 DIAGNOSIS — R52 Pain, unspecified: Secondary | ICD-10-CM | POA: Diagnosis not present

## 2018-11-10 DIAGNOSIS — I2510 Atherosclerotic heart disease of native coronary artery without angina pectoris: Secondary | ICD-10-CM | POA: Diagnosis not present

## 2018-11-10 DIAGNOSIS — I5032 Chronic diastolic (congestive) heart failure: Secondary | ICD-10-CM | POA: Insufficient documentation

## 2018-11-10 LAB — CBC WITH DIFFERENTIAL/PLATELET
Abs Immature Granulocytes: 0.06 10*3/uL (ref 0.00–0.07)
Basophils Absolute: 0.1 10*3/uL (ref 0.0–0.1)
Basophils Relative: 1 %
Eosinophils Absolute: 0.9 10*3/uL — ABNORMAL HIGH (ref 0.0–0.5)
Eosinophils Relative: 7 %
HCT: 25.5 % — ABNORMAL LOW (ref 39.0–52.0)
Hemoglobin: 7.2 g/dL — ABNORMAL LOW (ref 13.0–17.0)
Immature Granulocytes: 1 %
Lymphocytes Relative: 13 %
Lymphs Abs: 1.6 10*3/uL (ref 0.7–4.0)
MCH: 27.1 pg (ref 26.0–34.0)
MCHC: 28.2 g/dL — ABNORMAL LOW (ref 30.0–36.0)
MCV: 95.9 fL (ref 80.0–100.0)
Monocytes Absolute: 1.8 10*3/uL — ABNORMAL HIGH (ref 0.1–1.0)
Monocytes Relative: 14 %
NEUTROS PCT: 64 %
Neutro Abs: 7.9 10*3/uL — ABNORMAL HIGH (ref 1.7–7.7)
Platelets: 468 10*3/uL — ABNORMAL HIGH (ref 150–400)
RBC: 2.66 MIL/uL — ABNORMAL LOW (ref 4.22–5.81)
RDW: 23.3 % — ABNORMAL HIGH (ref 11.5–15.5)
WBC: 12.3 10*3/uL — ABNORMAL HIGH (ref 4.0–10.5)
nRBC: 0 % (ref 0.0–0.2)

## 2018-11-10 LAB — COMPREHENSIVE METABOLIC PANEL
ALBUMIN: 2.2 g/dL — AB (ref 3.5–5.0)
ALT: 12 U/L (ref 0–44)
AST: 16 U/L (ref 15–41)
Alkaline Phosphatase: 58 U/L (ref 38–126)
Anion gap: 6 (ref 5–15)
BUN: 17 mg/dL (ref 6–20)
CO2: 30 mmol/L (ref 22–32)
Calcium: 8.3 mg/dL — ABNORMAL LOW (ref 8.9–10.3)
Chloride: 103 mmol/L (ref 98–111)
Creatinine, Ser: 1.19 mg/dL (ref 0.61–1.24)
GFR calc Af Amer: >60 mL/min (ref >60)
GFR calc non Af Amer: >60 mL/min (ref >60)
GLUCOSE: 105 mg/dL — AB (ref 70–99)
Potassium: 3.7 mmol/L (ref 3.5–5.1)
Sodium: 139 mmol/L (ref 135–145)
TOTAL PROTEIN: 6.2 g/dL — AB (ref 6.5–8.1)
Total Bilirubin: 0.2 mg/dL — ABNORMAL LOW (ref 0.3–1.2)

## 2018-11-10 LAB — BRAIN NATRIURETIC PEPTIDE: B Natriuretic Peptide: 100.7 pg/mL — ABNORMAL HIGH (ref 0.0–100.0)

## 2018-11-10 LAB — PROTIME-INR
INR: 1.1 (ref 0.8–1.2)
Prothrombin Time: 13.9 seconds (ref 11.4–15.2)

## 2018-11-10 MED ORDER — OXYCODONE HCL 5 MG PO TABS
20.0000 mg | ORAL_TABLET | Freq: Once | ORAL | Status: AC
  Administered 2018-11-10: 20 mg via ORAL
  Filled 2018-11-10: qty 4

## 2018-11-10 MED ORDER — FUROSEMIDE 10 MG/ML IJ SOLN
40.0000 mg | Freq: Once | INTRAMUSCULAR | Status: AC
  Administered 2018-11-10: 40 mg via INTRAVENOUS
  Filled 2018-11-10: qty 4

## 2018-11-10 MED ORDER — HEPARIN SOD (PORK) LOCK FLUSH 100 UNIT/ML IV SOLN
500.0000 [IU] | Freq: Once | INTRAVENOUS | Status: AC
  Administered 2018-11-10: 500 [IU]
  Filled 2018-11-10: qty 5

## 2018-11-10 NOTE — Discharge Instructions (Signed)
Try and elevate the legs up above the level of your heart whenever you are sitting, if he can try and get up and move around as best you can throughout the day.  Please return to the emergency department for shortness of breath, or if you are concerned about anything else.  Please call your oncologist on Monday and discuss your visit here.  Unfortunately we are unable to wrap your legs in the emergency department.  Hospice as well as your oncologist may be able to schedule an appointment for someone to do this at the house or for you to visit them as an outpatient.

## 2018-11-10 NOTE — ED Notes (Signed)
Bed: QR97 Expected date:  Expected time:  Means of arrival:  Comments: EMS-[facility]

## 2018-11-10 NOTE — ED Provider Notes (Signed)
[facility] DEPT Provider Note   CSN: [id number] Arrival date & time: 11/10/18  1408    History   Chief Complaint Chief Complaint  Patient presents with  . Leg Swelling    HPI Dylan Gonzalez is a 53 y.o. male.     53 yo M with a chief complaint of bilateral lower extremity edema.  He states is been going on for 3 days.  He denies any change to his cancer regimen, denies prolonged time on his feet denies sleeping upright.  States that he has been able to lay back flat and sleep without difficulty.  States that his wife says that he has had some abnormal breathing while he is sleeping.  He denies any chest pain or shortness of breath states that he is on 2 L oxygen at all times.  Currently on cancer therapy.  The history is provided by the patient.  Illness  Severity:  Mild Onset quality:  Sudden Duration:  2 days Timing:  Constant Progression:  Worsening Chronicity:  New Associated symptoms: no abdominal pain, no chest pain, no congestion, no diarrhea, no fever, no headaches, no myalgias, no rash, no shortness of breath and no vomiting     Past Medical History:  Diagnosis Date  . Bilateral lower extremity edema   . Chronic diastolic congestive heart failure ([place]) 07/2018  . CKD (chronic kidney disease), stage II   . DOE (dyspnea on exertion)   . Foley catheter in place   . Hematuria   . History of CVA (cerebrovascular accident) 2007   per pt no residuals  . History of DVT of lower extremity 06/2018   left common femoral vein,  started on anticoagulant , stopped due to hematuria,  IVC filter placed 08-16-2018  . History of sepsis 09/28/2018   uti  . Hypertension   . Malignant neoplasm metastatic to lung ([place]) 04/19/2018  . Port-A-Cath in place 06/21/2018  . 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 S/P IVC filter 08/16/2018  . Urinary retention 09/28/2018   CHRONIC FOLEY CATH    Patient Active Problem List   Diagnosis Date Noted  . Abdominal pain 09/28/2018  . Sepsis ([place]) 09/28/2018  . Anemia 08/14/2018  . Symptomatic anemia 08/13/2018  . Acute UTI (urinary tract infection) 08/02/2018  . Deep vein thrombosis (DVT) of left lower extremity ([place]) 08/02/2018  . Bilateral lower extremity edema 08/01/2018  . CAD (coronary artery disease) 08/01/2018  . Hypokalemia 08/01/2018  . Port-A-Cath in place 06/29/2018  . Lung nodules 05/24/2018  . Leiomyosarcoma ([place]) 05/23/2018  . Goals of care, counseling/discussion 05/15/2018  . Bilateral ureteral obstruction 04/19/2018  . Malignant neoplasm metastatic to lung ([place]) 04/19/2018  . 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 Prostate neoplasm 04/13/2018  . Hypertension 12/26/2011  . 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 CYSTOSCOPY W/ URETERAL STENT PLACEMENT Bilateral 04/13/2018   Procedure: CYSTOSCOPY WITH BILATERAL STENT REPLACEMENT;  Surgeon: Irine Seal, MD;  Location: [facility];  Service: Urology;  Laterality: Bilateral;  . CYSTOSCOPY WITH STENT PLACEMENT Bilateral 10/19/2018   Procedure: CYSTOSCOPY WITH STENT EXCHANGE Bilateral;  Surgeon: Irine Seal, MD;  Location: [facility];  Service: Urology;  Laterality: Bilateral;  .  ENUCLEATION Left 1976   removal left eye due to Retinoplastoma  . INTRAOCULAR PROSTHESES INSERTION    . IR IMAGING GUIDED PORT INSERTION  06/21/2018  . IR IVC FILTER PLMT / S&I /IMG GUID/MOD SED  08/16/2018  . LAPAROSCOPIC INGUINAL HERNIA REPAIR Right 11-19-2010   dr d. Ninfa Linden  @[facility]    AND UMBILICAL HERNIA REPAIR  . PROSTATE BIOPSY [name] 04/13/2018   Procedure: ULTRASOUND GUIDED PROSTATE BIOPSY;  Surgeon: Irine Seal, MD;  Location: [facility];  Service:  Urology;  Laterality: [name];  . TOOTH EXTRACTION Right 04/21/2013   Procedure: EXTRACTION MOLARS;  Surgeon: [name], DDS;  Location: [facility];  Service: Oral Surgery;  Laterality: Right;  . TRANSURETHRAL RESECTION OF PROSTATE  04/13/2018   Procedure: TRANSURETHRAL RESECTION OF THE PROSTATE (TURP);  Surgeon: Irine Seal, MD;  Location: [facility];  Service: Urology;;        Home Medications    Prior to Admission medications   Medication Sig Start Date End Date Taking? Authorizing Provider  albuterol (PROVENTIL HFA) 108 (90 Base) MCG/ACT inhaler Inhale 1-2 puffs into the lungs every 6 (six) hours as needed for wheezing or shortness of breath.   Yes [provider]  fluticasone (FLONASE) 50 MCG/ACT nasal spray PLACE 2 SPRAYS INTO BOTH NOSTRILS DAILY. Patient taking differently: Place 2 sprays into both nostrils daily.  07/22/14  Yes Le, Thao P, DO  hydrochlorothiazide (HYDRODIURIL) 25 MG tablet Take 25 mg by mouth daily.   Yes [provider]  lidocaine-prilocaine (EMLA) cream Apply 1 application topically as needed. Patient taking differently: Apply 1 application topically as needed (port access).  06/22/18  Yes [name], MD  LORazepam (ATIVAN) 0.5 MG tablet Take 0.5 mg by mouth daily as needed for anxiety. 11/03/18  Yes [provider]  metoprolol tartrate (LOPRESSOR) 50 MG tablet Take 50 mg by mouth daily.    Yes [provider]  morphine (MS CONTIN) 30 MG 12 hr tablet Take 1 tablet (30 mg total) by mouth every 12 (twelve) hours. Patient taking differently: Take 30 mg by mouth every 8 (eight) hours as needed for pain.  10/30/18  Yes [name], MD  Oxycodone HCl 20 MG TABS Take 20 mg by mouth every 4 (four) hours as needed (pain).   Yes [provider]  polyethylene glycol powder (MIRALAX) powder Take 255 g by mouth daily. Take 8 cap fulls of miralax in 1 32 oz gatorade over 2-3 hours, then continue to use 1 cap full daily Patient taking  differently: Take 17 g by mouth daily. 1 cap full once a day 10/21/18  Yes Ford, Audery Amel, PA-C  promethazine (PHENERGAN) 12.5 MG tablet Take 12.5 mg by mouth every 6 (six) hours as needed for nausea or vomiting.   Yes [provider]  bisacodyl (DULCOLAX) 10 MG suppository Place 1 suppository (10 mg total) rectally daily as needed for moderate constipation. Patient not taking: Reported on 11/10/2018 08/05/18   Cristal Ford, DO  hydrOXYzine (VISTARIL) 100 MG capsule TAKE ONE CAPSULE BY MOUTH AT BEDTIME AS NEEDED FOR ALLERGY Patient not taking: Reported on 11/10/2018 10/12/15   Robyn Haber, MD  ondansetron (ZOFRAN) 8 MG tablet Take 1 tablet (8 mg total) by mouth every 8 (eight) hours as needed for nausea or vomiting. Patient not taking: Reported on 11/10/2018 07/28/18   [name], MD  polyethylene glycol [facility] / Floria Raveling) packet Take 17 g by mouth 2 (two) times daily as needed for mild constipation or  moderate constipation. Patient not taking: Reported on 10/21/2018 04/23/18   Orpah Greek, MD    Family History Family History  Problem Relation Age of Onset  . Hypertension Father   . Depression Brother     Social History Social History   Tobacco Use  . Smoking status: Never Smoker  . Smokeless tobacco: Never Used  Substance Use Topics  . Alcohol use: No  . Drug use: Never     Allergies   Dilaudid [hydromorphone hcl] and Pollen extract   Review of Systems Review of Systems  Constitutional: Negative for chills and fever.  HENT: Negative for congestion and facial swelling.   Eyes: Negative for discharge and visual disturbance.  Respiratory: Negative for shortness of breath.   Cardiovascular: Positive for leg swelling. Negative for chest pain and palpitations.  Gastrointestinal: Negative for abdominal pain, diarrhea and vomiting.  Musculoskeletal: Negative for arthralgias and myalgias.  Skin: Negative for color change and rash.  Neurological: Negative  for tremors, syncope and headaches.  Psychiatric/Behavioral: Negative for confusion and dysphoric mood.     Physical Exam Updated Vital Signs BP 128/79   Pulse 85   Temp (!) 97.4 F (36.3 C) (Oral)   Resp 17   Ht 5\' 11"  (1.803 m)   SpO2 99%   BMI 31.26 kg/m   Physical Exam Vitals signs and nursing note reviewed.  Constitutional:      Appearance: He is well-developed.  HENT:     Head: Normocephalic and atraumatic.  Eyes:     Pupils: Pupils are equal, round, and reactive to light.  Neck:     Musculoskeletal: Normal range of motion and neck supple.     Vascular: No JVD.  Cardiovascular:     Rate and Rhythm: Normal rate and regular rhythm.     Heart sounds: No murmur. No friction rub. No gallop.   Pulmonary:     Effort: No respiratory distress.     Breath sounds: No wheezing.  Abdominal:     General: There is no distension.     Tenderness: There is no guarding or rebound.  Musculoskeletal: Normal range of motion.     Right lower leg: Edema present.     Left lower leg: Edema present.     Comments: BLE up to the thigh  Skin:    Coloration: Skin is not pale.     Findings: No rash.  Neurological:     Mental Status: He is alert and oriented to person, place, and time.  Psychiatric:        Behavior: Behavior normal.      ED Treatments / Results  Labs (all labs ordered are listed, but only abnormal results are displayed) Labs Reviewed  CBC WITH DIFFERENTIAL/PLATELET - Abnormal; Notable for the following components:      Result Value   WBC 12.3 (*)    RBC 2.66 (*)    Hemoglobin 7.2 (*)    HCT 25.5 (*)    MCHC 28.2 (*)    RDW 23.3 (*)    Platelets 468 (*)    Neutro Abs 7.9 (*)    Monocytes Absolute 1.8 (*)    Eosinophils Absolute 0.9 (*)    All other components within normal limits  COMPREHENSIVE METABOLIC PANEL - Abnormal; Notable for the following components:   Glucose, Bld 105 (*)    Calcium 8.3 (*)    Total Protein 6.2 (*)    Albumin 2.2 (*)    Total  Bilirubin 0.2 (*)  All other components within normal limits  BRAIN NATRIURETIC PEPTIDE - Abnormal; Notable for the following components:   B Natriuretic Peptide 100.7 (*)    All other components within normal limits  PROTIME-INR    EKG None  Radiology Dg Chest 2 View  Result Date: 11/10/2018 CLINICAL DATA:  Bilateral lower extremity swelling since yesterday. Patient currently undergoing chemotherapy for prostate cancer. EXAM: CHEST - 2 VIEW COMPARISON:  None. FINDINGS: The heart is upper limits of normal. This is exaggerated by low lung volumes. Right IJ Port-A-Cath is stable. There is mild pulmonary vascular congestion without frank edema. No effusions are present. IMPRESSION: 1. Mild pulmonary vascular congestion without frank edema. 2. Low lung volumes. Electronically Signed   By: San Morelle M.D.   On: 11/10/2018 16:44    Procedures Procedures (including critical care time)  Medications Ordered in ED Medications  heparin lock flush 100 unit/mL (has no administration in time range)  furosemide (LASIX) injection 40 mg (40 mg Intravenous Given 11/10/18 1834)  oxyCODONE (Oxy IR/ROXICODONE) immediate release tablet 20 mg (20 mg Oral Given 11/10/18 1832)     Initial Impression / Assessment and Plan / ED Course  I have reviewed the triage vital signs and the nursing notes.  Pertinent labs & imaging results that were available during my care of the patient were reviewed by me and considered in my medical decision making (see chart for details).        53 yo M with a chief complaint of bilateral lower extremity edema.  Patient states is going on for just 3 days though looking back at his medical record it looks like he has had this issue at least off and on for the past month.  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  Will obtain a laboratory evaluation to evaluate for new heart failure check his renal  function.  Patient's lab work looks pretty consistent with his previous.  His leukocytosis has improved to over the past few checks.  BNP is negative chest x-ray viewed by me without significant edema.  I discussed the results with the family.  They would prefer to go home.  They are asking to have their legs wrapped while in the ED.  I discussed this with the charge nurse and there is no nurse here that is trained to perform this procedure.  Will discharge the patient home.  Oncology follow-up.  6:40 PM:  I have discussed the diagnosis/risks/treatment options with the patient and family and believe the pt to be eligible for discharge home to follow-up with PCP, Oncology. We also discussed returning to the ED immediately if new or worsening sx occur. We discussed the sx which are most concerning (e.g., sudden worsening sob, fever, inability to tolerate by mouth) that necessitate immediate return. Medications administered to the patient during their visit and any new prescriptions provided to the patient are listed below.  Medications given during this visit Medications  heparin lock flush 100 unit/mL (has no administration in time range)  furosemide (LASIX) injection 40 mg (40 mg Intravenous Given 11/10/18 1834)  oxyCODONE (Oxy IR/ROXICODONE) immediate release tablet 20 mg (20 mg Oral Given 11/10/18 1832)     The patient appears reasonably screen and/or stabilized for discharge and I doubt any other medical condition or other [facility] requiring further screening, evaluation, or treatment in the ED at this time prior to discharge.   Final Clinical Impressions(s) / ED Diagnoses  Final diagnoses:  Bilateral lower extremity edema    ED Discharge Orders    None       Deno Etienne, DO 11/10/18 1840

## 2018-11-10 NOTE — ED Triage Notes (Signed)
Pt BIB EMS from home. Pt reports bilateral leg swelling since yesterday. Pt reports chemo treatment last week. Pts wife woke him out of his sleep because his breathing was abnormal. When EMS arrived patient was breathing normally. Pt wears 2L of O2 at home.   113/72 HR 72 100% 2L O2

## 2018-11-24 ENCOUNTER — Telehealth: Payer: Self-pay

## 2018-11-24 NOTE — Telephone Encounter (Signed)
Contacted patient and spouse regarding scheduled appointment on 3/23. Patient currently receiving hospice services in the home and they do not feel that patient needs to attend appointment. Explained that appointment will be cancelled.

## 2018-11-27 ENCOUNTER — Other Ambulatory Visit: Payer: BLUE CROSS/BLUE SHIELD

## 2018-11-27 ENCOUNTER — Ambulatory Visit: Payer: BLUE CROSS/BLUE SHIELD | Admitting: Oncology

## 2019-01-04 ENCOUNTER — Telehealth: Payer: Self-pay

## 2019-01-04 NOTE — Telephone Encounter (Signed)
Received call from Darnelle Spangle hospice RN with [facility] requesting a verbal order for patient to continue with hospice services since has been recertified and approved for continued services by Dr. Tomasa Hosteller hospice MD. Provided verbal order to continue with hospice services.

## 2019-02-09 ENCOUNTER — Emergency Department (HOSPITAL_COMMUNITY): Payer: Self-pay

## 2019-02-09 ENCOUNTER — Other Ambulatory Visit: Payer: Self-pay

## 2019-02-09 ENCOUNTER — Emergency Department (HOSPITAL_COMMUNITY)
Admission: EM | Admit: 2019-02-09 | Discharge: 2019-02-10 | Disposition: A | Discharge status: Home / Self Care | Payer: Self-pay | Attending: Emergency Medicine | Admitting: Emergency Medicine

## 2019-02-09 ENCOUNTER — Encounter (HOSPITAL_COMMUNITY): Payer: Self-pay

## 2019-02-09 DIAGNOSIS — N390 Urinary tract infection, site not specified: Secondary | ICD-10-CM | POA: Insufficient documentation

## 2019-02-09 DIAGNOSIS — D4959 Neoplasm of unspecified behavior of other genitourinary organ: Secondary | ICD-10-CM

## 2019-02-09 DIAGNOSIS — Z8546 Personal history of malignant neoplasm of prostate: Secondary | ICD-10-CM | POA: Insufficient documentation

## 2019-02-09 DIAGNOSIS — N182 Chronic kidney disease, stage 2 (mild): Secondary | ICD-10-CM | POA: Insufficient documentation

## 2019-02-09 DIAGNOSIS — C762 Malignant neoplasm of abdomen: Secondary | ICD-10-CM | POA: Insufficient documentation

## 2019-02-09 DIAGNOSIS — I5032 Chronic diastolic (congestive) heart failure: Secondary | ICD-10-CM | POA: Insufficient documentation

## 2019-02-09 DIAGNOSIS — I130 Hypertensive heart and chronic kidney disease with heart failure and stage 1 through stage 4 chronic kidney disease, or unspecified chronic kidney disease: Secondary | ICD-10-CM | POA: Insufficient documentation

## 2019-02-09 DIAGNOSIS — E860 Dehydration: Secondary | ICD-10-CM | POA: Insufficient documentation

## 2019-02-09 DIAGNOSIS — C494 Malignant neoplasm of connective and soft tissue of abdomen: Secondary | ICD-10-CM

## 2019-02-09 DIAGNOSIS — Z79899 Other long term (current) drug therapy: Secondary | ICD-10-CM | POA: Insufficient documentation

## 2019-02-09 DIAGNOSIS — N179 Acute kidney failure, unspecified: Secondary | ICD-10-CM | POA: Insufficient documentation

## 2019-02-09 LAB — CBC WITH DIFFERENTIAL/PLATELET
Abs Immature Granulocytes: 0.05 10*3/uL (ref 0.00–0.07)
Basophils Absolute: 0 10*3/uL (ref 0.0–0.1)
Basophils Relative: 0 %
Eosinophils Absolute: 0.1 10*3/uL (ref 0.0–0.5)
Eosinophils Relative: 1 %
HCT: 25 % — ABNORMAL LOW (ref 39.0–52.0)
Hemoglobin: 7.3 g/dL — ABNORMAL LOW (ref 13.0–17.0)
Immature Granulocytes: 0 %
Lymphocytes Relative: 10 %
Lymphs Abs: 1.3 10*3/uL (ref 0.7–4.0)
MCH: 23.5 pg — ABNORMAL LOW (ref 26.0–34.0)
MCHC: 29.2 g/dL — ABNORMAL LOW (ref 30.0–36.0)
MCV: 80.4 fL (ref 80.0–100.0)
Monocytes Absolute: 1.3 10*3/uL — ABNORMAL HIGH (ref 0.1–1.0)
Monocytes Relative: 11 %
Neutro Abs: 9.3 10*3/uL — ABNORMAL HIGH (ref 1.7–7.7)
Neutrophils Relative %: 78 %
Platelets: 342 10*3/uL (ref 150–400)
RBC: 3.11 MIL/uL — ABNORMAL LOW (ref 4.22–5.81)
RDW: 19.5 % — ABNORMAL HIGH (ref 11.5–15.5)
WBC: 12 10*3/uL — ABNORMAL HIGH (ref 4.0–10.5)
nRBC: 0 % (ref 0.0–0.2)

## 2019-02-09 LAB — URINALYSIS, ROUTINE W REFLEX MICROSCOPIC
Bilirubin Urine: NEGATIVE
Glucose, UA: NEGATIVE mg/dL
Ketones, ur: NEGATIVE mg/dL
Nitrite: NEGATIVE
Protein, ur: 100 mg/dL — AB
RBC / HPF: >50 RBC/hpf — ABNORMAL HIGH (ref 0–5)
Specific Gravity, Urine: 1.006 (ref 1.005–1.030)
WBC, UA: >50 WBC/hpf — ABNORMAL HIGH (ref 0–5)
pH: 6 (ref 5.0–8.0)

## 2019-02-09 LAB — COMPREHENSIVE METABOLIC PANEL
ALT: 11 U/L (ref 0–44)
AST: 17 U/L (ref 15–41)
Albumin: 2.9 g/dL — ABNORMAL LOW (ref 3.5–5.0)
Alkaline Phosphatase: 58 U/L (ref 38–126)
Anion gap: 9 (ref 5–15)
BUN: 47 mg/dL — ABNORMAL HIGH (ref 6–20)
CO2: 27 mmol/L (ref 22–32)
Calcium: 8 mg/dL — ABNORMAL LOW (ref 8.9–10.3)
Chloride: 93 mmol/L — ABNORMAL LOW (ref 98–111)
Creatinine, Ser: 2.35 mg/dL — ABNORMAL HIGH (ref 0.61–1.24)
GFR calc Af Amer: 36 mL/min — ABNORMAL LOW (ref >60)
GFR calc non Af Amer: 31 mL/min — ABNORMAL LOW (ref >60)
Glucose, Bld: 106 mg/dL — ABNORMAL HIGH (ref 70–99)
Potassium: 3.5 mmol/L (ref 3.5–5.1)
Sodium: 129 mmol/L — ABNORMAL LOW (ref 135–145)
Total Bilirubin: 0.6 mg/dL (ref 0.3–1.2)
Total Protein: 8.1 g/dL (ref 6.5–8.1)

## 2019-02-09 LAB — LIPASE, BLOOD: Lipase: 18 U/L (ref 11–51)

## 2019-02-09 MED ORDER — SODIUM CHLORIDE 0.9 % IV BOLUS
500.0000 mL | Freq: Once | INTRAVENOUS | Status: AC
  Administered 2019-02-09: 500 mL via INTRAVENOUS

## 2019-02-09 MED ORDER — SODIUM CHLORIDE 0.9 % IV SOLN
1.0000 g | Freq: Once | INTRAVENOUS | Status: AC
  Administered 2019-02-10: 1 g via INTRAVENOUS
  Filled 2019-02-09: qty 10

## 2019-02-09 NOTE — ED Notes (Signed)
Pt wears 3L of O2 at home.

## 2019-02-09 NOTE — ED Provider Notes (Signed)
[facility] DEPT Provider Note   CSN: [id number] Arrival date & time: 02/09/19  2006    History   Chief Complaint Chief Complaint  Patient presents with   Weakness    HPI Dylan Gonzalez is a 53 y.o. male with a past medical history of chronic diastolic CHF, CKD, Foley catheter, prior CVA and DVT,.  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  He and his wife provide history.  Patient reportedly has been weak since Monday and that he is not able to walk around as he usually can.  He has had worsening anterior lower abdominal pain.  He has a Foley catheter in place and on Tuesday was started on an antibiotic by hospice, however patient and his wife are unsure which antibiotic.  They deny any fevers at home.  He says that when he stands up he feels dizzy and weak.    No nausea, vomiting, or diarrhea.  His wife reports that he appears slightly confused, he is having difficulty remembering events that he normally does not struggle with.     HPI  Past Medical History:  Diagnosis Date   Bilateral lower extremity edema    Chronic diastolic congestive heart failure ([place]) 07/2018   CKD (chronic kidney disease), stage II    DOE (dyspnea on exertion)    Foley catheter in place    Hematuria    History of CVA (cerebrovascular accident) 2007   per pt no residuals   History of DVT of lower extremity 06/2018   left common femoral vein,  started on anticoagulant , stopped due to hematuria,  IVC filter placed 08-16-2018   History of sepsis 09/28/2018   uti   Hypertension    Malignant neoplasm metastatic to lung ([place]) 04/19/2018   Port-A-Cath in place 06/21/2018   Prostate cancer metastatic to lung [facility]) urologist-  dr wrenn/  oncologist-- dr Alen Blew   dx 08/ 2019,  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,  started chemo therapy  05-30-2018   Retinoblastoma, unilateral ([place])    Left eye enucleation 1976   S/P IVC filter 08/16/2018   Urinary retention 09/28/2018   CHRONIC FOLEY CATH    Patient Active Problem List   Diagnosis Date Noted   Abdominal pain 09/28/2018   Sepsis ([place]) 09/28/2018   Anemia 08/14/2018   Symptomatic anemia 08/13/2018   Acute UTI (urinary tract infection) 08/02/2018   Deep vein thrombosis (DVT) of left lower extremity ([place]) 08/02/2018   Bilateral lower extremity edema 08/01/2018   CAD (coronary artery disease) 08/01/2018   Hypokalemia 08/01/2018   Port-A-Cath in place 06/29/2018   Lung nodules 05/24/2018   Leiomyosarcoma ([place]) 05/23/2018   Goals of care, counseling/discussion 05/15/2018   Bilateral ureteral obstruction 04/19/2018   Malignant neoplasm metastatic to lung ([place]) 04/19/2018   Primary leiomyosarcoma of intra-abdominal site ([place]) 04/18/2018   Prostate neoplasm 04/13/2018   Hypertension 12/26/2011   Retinoblastoma, unilateral ([place]) 12/26/2011    Past Surgical History:  Procedure Laterality Date   CYSTOSCOPY W/ URETERAL STENT PLACEMENT Bilateral 04/13/2018   Procedure: CYSTOSCOPY WITH BILATERAL STENT REPLACEMENT;  Surgeon: Irine Seal, MD;  Location: [facility];  Service: Urology;  Laterality: Bilateral;   CYSTOSCOPY WITH STENT PLACEMENT Bilateral 10/19/2018   Procedure: CYSTOSCOPY WITH STENT EXCHANGE Bilateral;  Surgeon: Irine Seal, MD;  Location: [facility];  Service: Urology;  Laterality: Bilateral;   ENUCLEATION Left 1976  removal left eye due to [place]     IR IMAGING GUIDED PORT INSERTION  06/21/2018   IR IVC FILTER PLMT / S&I /IMG GUID/MOD SED  08/16/2018   LAPAROSCOPIC INGUINAL HERNIA REPAIR Right 11-19-2010   dr d. Ninfa Linden  @[facility]    AND UMBILICAL HERNIA REPAIR   PROSTATE BIOPSY [name] 04/13/2018   Procedure: ULTRASOUND GUIDED PROSTATE BIOPSY;  Surgeon: Irine Seal, MD;  Location: [facility];  Service:  Urology;  Laterality: [name];   TOOTH EXTRACTION Right 04/21/2013   Procedure: EXTRACTION MOLARS;  Surgeon: [name], DDS;  Location: [facility];  Service: Oral Surgery;  Laterality: Right;   TRANSURETHRAL RESECTION OF PROSTATE  04/13/2018   Procedure: TRANSURETHRAL RESECTION OF THE PROSTATE (TURP);  Surgeon: Irine Seal, MD;  Location: [facility];  Service: Urology;;        Home Medications    Prior to Admission medications   Medication Sig Start Date End Date Taking? Authorizing Provider  acetaminophen (TYLENOL) 500 MG tablet Take 1,000 mg by mouth 2 (two) times daily as needed for mild pain or moderate pain.   Yes [provider]  albuterol (PROVENTIL HFA) 108 (90 Base) MCG/ACT inhaler Inhale 2 puffs into the lungs every 6 (six) hours as needed for wheezing or shortness of breath.    Yes [provider]  Albuterol Sulfate 2.5 MG/0.5ML NEBU Inhale 1 each into the lungs every 6 (six) hours as needed (shortness of breath/wheezing).   Yes [provider]  cephALEXin (KEFLEX) 500 MG capsule Take 500 mg by mouth 3 (three) times daily.   Yes [provider]  diazepam (VALIUM) 5 MG tablet Take 5 mg by mouth 3 (three) times daily.   Yes [provider]  escitalopram (LEXAPRO) 10 MG tablet Take 10 mg by mouth daily.   Yes [provider]  fluticasone (FLONASE) 50 MCG/ACT nasal spray PLACE 2 SPRAYS INTO BOTH NOSTRILS DAILY. Patient taking differently: Place 2 sprays into both nostrils daily.  07/22/14  Yes Le, Thao P, DO  gabapentin (NEURONTIN) 300 MG capsule Take 300 mg by mouth 3 (three) times daily.   Yes [provider]  LORazepam (ATIVAN) 0.5 MG tablet Take 0.5 mg by mouth every 4 (four) hours as needed for anxiety.  11/03/18  Yes [provider]  metoprolol tartrate (LOPRESSOR) 50 MG tablet Take 50 mg by mouth daily.    Yes [provider]  mirabegron ER (MYRBETRIQ) 25 MG TB24 tablet Take 25 mg by mouth daily.   Yes  [provider]  morphine (MS CONTIN) 30 MG 12 hr tablet Take 1 tablet (30 mg total) by mouth every 12 (twelve) hours. Patient taking differently: Take 30 mg by mouth 3 (three) times daily.  10/30/18  Yes [name], MD  oxyCODONE (OXY IR/ROXICODONE) 5 MG immediate release tablet Take 5-20 mg by mouth See admin instructions. Every 4 hours as needed for breakthrough pain:  5mg  for mild pain 10 mg for moderate pain 20 mg for severe pain   Yes [provider]  polyethylene glycol (MIRALAX / GLYCOLAX) packet Take 17 g by mouth 2 (two) times daily as needed for mild constipation or moderate constipation. Patient taking differently: Take 17 g by mouth daily as needed for mild constipation or moderate constipation.  04/23/18  Yes Pollina, Gwenyth Allegra, MD  polyethylene glycol powder (MIRALAX) powder Take 255 g by mouth daily. Take 8 cap fulls of miralax in 1 32 oz gatorade over 2-3  hours, then continue to use 1 cap full daily Patient taking differently: Take 17 g by mouth daily. 1 cap full once a day 10/21/18  Yes Ford, Audery Amel, PA-C  prochlorperazine (COMPAZINE) 10 MG tablet Take 10 mg by mouth every 6 (six) hours as needed for nausea or vomiting.   Yes [provider]  sennosides-docusate sodium (SENOKOT-S) 8.6-50 MG tablet Take 1 tablet by mouth daily as needed for constipation.   Yes [provider]  hydrOXYzine (VISTARIL) 100 MG capsule TAKE ONE CAPSULE BY MOUTH AT BEDTIME AS NEEDED FOR ALLERGY Patient not taking: Reported on 11/10/2018 10/12/15   Robyn Haber, MD  ondansetron (ZOFRAN) 8 MG tablet Take 1 tablet (8 mg total) by mouth every 8 (eight) hours as needed for nausea or vomiting. Patient not taking: Reported on 11/10/2018 07/28/18   [name], MD    Family History Family History  Problem Relation Age of Onset   Hypertension Father    Depression Brother     Social History Social History   Tobacco Use   Smoking status: Never Smoker    Smokeless tobacco: Never Used  Substance Use Topics   Alcohol use: No   Drug use: Never     Allergies   Dilaudid [hydromorphone hcl] and Pollen extract   Review of Systems Review of Systems  Constitutional: Negative for activity change, fatigue and fever.  Respiratory: Negative for shortness of breath.   Gastrointestinal: Positive for abdominal pain. Negative for constipation, diarrhea, nausea and vomiting.  Musculoskeletal: Negative for back pain and neck pain.  Neurological: Positive for light-headedness (When standing). Negative for weakness and headaches.  Psychiatric/Behavioral: Negative for confusion.  All other systems reviewed and are negative.    Physical Exam Updated Vital Signs BP 120/81 (BP Location: Left Arm)    Pulse 82    Temp 99.4 F (37.4 C) (Rectal)    Resp 16    Ht 5\' 11"  (1.803 m)    Wt 83.9 kg    SpO2 100%    BMI 25.80 kg/m   Physical Exam Vitals signs and nursing note reviewed.  Constitutional:      General: He is not in acute distress.    Appearance: He is well-developed. He is not diaphoretic.     Comments: Sleepy, awakens to loud voice.  He is chronically ill-appearing.  HENT:     Head: Normocephalic and atraumatic.     Mouth/Throat:     Mouth: Mucous membranes are moist.  Eyes:     General: No scleral icterus.       Right eye: No discharge.        Left eye: No discharge.     Conjunctiva/sclera: Conjunctivae normal.  Neck:     Musculoskeletal: Normal range of motion.  Cardiovascular:     Rate and Rhythm: Normal rate and regular rhythm.     Pulses: Normal pulses.     Heart sounds: Normal heart sounds.  Pulmonary:     Effort: Pulmonary effort is normal. No respiratory distress.     Breath sounds: Normal breath sounds. No stridor.  Abdominal:     General: Abdomen is flat. There is no distension.     Palpations: There is mass (Lower abdomen).     Tenderness: There is abdominal tenderness (Bilateral lower abdomen).  Musculoskeletal:         General: No deformity.     Right lower leg: No edema.     Left lower leg: No edema.  Skin:  General: Skin is warm and dry.  Neurological:     Motor: No abnormal muscle tone.     Comments: Left sided eye droop secondary to previous enucleation, remainder of face is symmetrical.  He is alert and oriented x4.  His speech is not slurred.  He is able to lift his bilateral arms and legs without difficulty.  Psychiatric:        Mood and Affect: Mood normal.        Behavior: Behavior normal.      ED Treatments / Results  Labs (all labs ordered are listed, but only abnormal results are displayed) Labs Reviewed  COMPREHENSIVE METABOLIC PANEL - Abnormal; Notable for the following components:      Result Value   Sodium 129 (*)    Chloride 93 (*)    Glucose, Bld 106 (*)    BUN 47 (*)    Creatinine, Ser 2.35 (*)    Calcium 8.0 (*)    Albumin 2.9 (*)    GFR calc non Af Amer 31 (*)    GFR calc Af Amer 36 (*)    All other components within normal limits  CBC WITH DIFFERENTIAL/PLATELET - Abnormal; Notable for the following components:   WBC 12.0 (*)    RBC 3.11 (*)    Hemoglobin 7.3 (*)    HCT 25.0 (*)    MCH 23.5 (*)    MCHC 29.2 (*)    RDW 19.5 (*)    Neutro Abs 9.3 (*)    Monocytes Absolute 1.3 (*)    All other components within normal limits  URINALYSIS, ROUTINE W REFLEX MICROSCOPIC - Abnormal; Notable for the following components:   APPearance TURBID (*)    Hgb urine dipstick LARGE (*)    Protein, ur 100 (*)    Leukocytes,Ua LARGE (*)    RBC / HPF >50 (*)    WBC, UA >50 (*)    Bacteria, UA MANY (*)    Non Squamous Epithelial 0-5 (*)    All other components within normal limits  URINE CULTURE  LIPASE, BLOOD    EKG None  Radiology Ct Head Wo Contrast  Result Date: 02/09/2019 CLINICAL DATA:  Weakness EXAM: CT HEAD WITHOUT CONTRAST TECHNIQUE: Contiguous axial images were obtained from the base of the skull through the vertex without intravenous contrast. COMPARISON:   CT head dated 02/02/2014. FINDINGS: Brain: No evidence of acute infarction, hemorrhage, hydrocephalus, extra-axial collection or mass lesion/mass effect. There is age related volume loss. There are advanced chronic microvascular ischemic changes bilaterally. Vascular: No hyperdense vessel or unexpected calcification. Skull: Normal. Negative for fracture or focal lesion. Sinuses/Orbits: No acute finding. Postsurgical changes are noted of the left orbit. Other: None. IMPRESSION: 1. No acute intracranial abnormality detected. 2. Chronic microvascular ischemic changes with age related volume loss, progressed from 59. Electronically Signed   By: Constance Holster M.D.   On: 02/09/2019 23:35   Ct Renal Stone Study  Result Date: 02/09/2019 CLINICAL DATA:  Bladder and prostate cancer with increasing left lower quadrant abdominal pain. EXAM: CT ABDOMEN AND PELVIS WITHOUT CONTRAST TECHNIQUE: Multidetector CT imaging of the abdomen and pelvis was performed following the standard protocol without IV contrast. COMPARISON:  None. FINDINGS: Lower chest: There is a 1.7 cm pulmonary nodule in the left lower lobe. This has increased significantly in size from prior study. Heart size is significantly enlarged. There is a trace right-sided pleural effusion. Hepatobiliary: No focal liver abnormality is seen. No gallstones, gallbladder wall thickening, or biliary  dilatation. Pancreas: Unremarkable. No pancreatic ductal dilatation or surrounding inflammatory changes. Spleen: Normal in size without focal abnormality. Adrenals/Urinary Tract: There are bilateral double-J ureteral stents in place. There is some mild bilateral collecting system dilatation. The stents appear to terminate in the decompressed urinary bladder. There is a Foley catheter in place. Stomach/Bowel: The stomach is unremarkable. There is a moderate amount of stool in the colon. The appendix is unremarkable. There is no evidence of a small-bowel obstruction.  Vascular/Lymphatic: An IVC filter is in place. There are some prominent but subcentimeter retroperitoneal lymph nodes. There are prominent inguinal and pelvic lymph nodes. Reproductive: There is a large prostate mass currently measuring approximately 7.8 by 8.4 cm (previously measuring 10 x 11 cm. There is a central low attenuation area measuring approximately 21 Hounsfield units. This may represent a necrotic portion of the tumor. Other: No abdominal wall hernia or abnormality. No abdominopelvic ascites. Musculoskeletal: No acute or significant osseous findings. IMPRESSION: 1. Examination limited by lack of IV contrast. 2. Dominant pelvic mass with interval decrease in size consistent with the patient's known prostate cancer. There is a central low-attenuation area which may represent necrosis. 3. Significant interval increase in size of a left lower lobe pulmonary nodule currently measuring approximately 1.6 cm. This is consistent with metastatic disease. 4. Cardiomegaly.  There is a trace right-sided pleural effusion. 5. Moderate amount of stool throughout the colon. 6. Bilateral double-J ureteral stents are in place. There is mild symmetric bilateral collecting system dilatation without frank hydronephrosis. 7. IVC filter in place. Electronically Signed   By: Constance Holster M.D.   On: 02/09/2019 23:43    Procedures Procedures (including critical care time)  Medications Ordered in ED Medications  sodium chloride 0.9 % bolus 500 mL (500 mLs Intravenous New Bag/Given 02/09/19 2313)  cefTRIAXone (ROCEPHIN) 1 g in sodium chloride 0.9 % 100 mL IVPB (1 g Intravenous New Bag/Given 02/10/19 0014)     Initial Impression / Assessment and Plan / ED Course  I have reviewed the triage vital signs and the nursing notes.  Pertinent labs & imaging results that were available during my care of the patient were reviewed by me and considered in my medical decision making (see chart for details).  Clinical Course  as of Feb 09 100  Fri Feb 09, 2019  2203 Luella Cook- Patients father [phone]   [EH]  2203 With patient's permission I spoke with his wife.  She states that he is confused and that he is having difficulty remembering things.  This has happened over the past few days.  Since Tuesday he has been on an antibiotic however she does not know which.  He has had bilateral lower abdominal pain.   [EH]  Sat Feb 10, 2019  0058 Based on patient's decreased creatinine, Keflex dose recommendation is not to exceed 1 gram per day.  While Normally would be treated with 500mg  QID will reduce dose to 500mg  BID based on renal function.    [EH]    Clinical Course User Index [EH] Lorin Glass, PA-C      Patient presents today for evaluation of generally feeling weak and unwell along with his wife stating that he is having difficulty remembering.  He is currently being treated for urinary tract infection with an unknown antibiotic by hospice where he is a patient due to his extensive cancer.  Urine is obtained from Foley catheter which shows many bacteria and concern for infection, will be sent for culture.  Additional labs were obtained and reviewed, he does not meet Sirs/sepsis criteria.  His labs appear consistent with his baseline with the exception of his creatinine which is significantly elevated from previous.  He is given IV fluids while in the emergency room, along with p.o. challenge.  Based on wife's reported confusion with metastatic cancer CT head was obtained which did not show evidence of metastasis or acute abnormalities.  CT abdomen pelvis noncontrast was obtained based on kidney function, lower abdominal pain which showed interval increasing size of his masses along with moderate constipation.  He is given a dose of Rocephin while in the emergency room.  He is given a prescription for Keflex, and instructed only to take this if the antibiotic that he has been taking his Bactrim and is  instructed to discontinue taking any Bactrim due to his kidney function.  I spoke with both patient and his wife and discussed their options, patient wishes for discharge home.  Recommended that they contact hospice tomorrow for repeat evaluation.    Return precautions were discussed with patient and wife who states their understanding.  At the time of discharge patient denied any unaddressed complaints or concerns.  Patient is agreeable for discharge home.    Final Clinical Impressions(s) / ED Diagnoses   Final diagnoses:  Dehydration  AKI (acute kidney injury) ([place])  Lower urinary tract infectious disease  Primary leiomyosarcoma of intra-abdominal site [facility])  Prostate neoplasm    ED Discharge Orders    None       Ollen Gross 02/10/19 0101    Quintella Reichert, MD 02/10/19 [phone]

## 2019-02-09 NOTE — ED Triage Notes (Signed)
Pt BIB [facility] from home. Pt has stage 4 bladder and prostate cancer and finished chemo in Feb.   Pt c/o weakness, dizziness and LL abd pain x4 days. Pt wife reports pt is disoriented at time, however, pt is A&O x4 at this time.   50 of Fentanyl was given by EMS, pt pain was a 6 now it is a 4. Pt is also a pt of hospice.

## 2019-02-09 NOTE — ED Notes (Signed)
Bed: TA56 Expected date:  Expected time:  Means of arrival:  Comments: 76M cancer/ weakness/ on hospice

## 2019-02-09 NOTE — ED Notes (Signed)
Pt transported to CT ?

## 2019-02-09 NOTE — ED Notes (Signed)
Dylan Gonzalez [phone] pt Father

## 2019-02-09 NOTE — ED Notes (Signed)
Lab called to add on urine culture at this time.

## 2019-02-10 ENCOUNTER — Emergency Department (HOSPITAL_COMMUNITY): Payer: Self-pay

## 2019-02-10 ENCOUNTER — Inpatient Hospital Stay (HOSPITAL_COMMUNITY)
Admission: EM | Admit: 2019-02-10 | Discharge: 2019-02-15 | DRG: 872 | Disposition: A | Discharge status: Hospice | Payer: Self-pay | Attending: Internal Medicine | Admitting: Internal Medicine

## 2019-02-10 ENCOUNTER — Encounter (HOSPITAL_COMMUNITY): Payer: Self-pay

## 2019-02-10 ENCOUNTER — Other Ambulatory Visit: Payer: Self-pay

## 2019-02-10 DIAGNOSIS — E876 Hypokalemia: Secondary | ICD-10-CM | POA: Diagnosis present

## 2019-02-10 DIAGNOSIS — E785 Hyperlipidemia, unspecified: Secondary | ICD-10-CM | POA: Diagnosis present

## 2019-02-10 DIAGNOSIS — Z23 Encounter for immunization: Secondary | ICD-10-CM

## 2019-02-10 DIAGNOSIS — I130 Hypertensive heart and chronic kidney disease with heart failure and stage 1 through stage 4 chronic kidney disease, or unspecified chronic kidney disease: Secondary | ICD-10-CM | POA: Diagnosis present

## 2019-02-10 DIAGNOSIS — N139 Obstructive and reflux uropathy, unspecified: Secondary | ICD-10-CM | POA: Diagnosis present

## 2019-02-10 DIAGNOSIS — R339 Retention of urine, unspecified: Secondary | ICD-10-CM | POA: Diagnosis present

## 2019-02-10 DIAGNOSIS — Z885 Allergy status to narcotic agent status: Secondary | ICD-10-CM

## 2019-02-10 DIAGNOSIS — C494 Malignant neoplasm of connective and soft tissue of abdomen: Secondary | ICD-10-CM | POA: Diagnosis present

## 2019-02-10 DIAGNOSIS — C7800 Secondary malignant neoplasm of unspecified lung: Secondary | ICD-10-CM | POA: Diagnosis present

## 2019-02-10 DIAGNOSIS — Z515 Encounter for palliative care: Secondary | ICD-10-CM | POA: Diagnosis present

## 2019-02-10 DIAGNOSIS — R310 Gross hematuria: Secondary | ICD-10-CM | POA: Diagnosis present

## 2019-02-10 DIAGNOSIS — I2510 Atherosclerotic heart disease of native coronary artery without angina pectoris: Secondary | ICD-10-CM | POA: Diagnosis present

## 2019-02-10 DIAGNOSIS — A419 Sepsis, unspecified organism: Principal | ICD-10-CM | POA: Diagnosis present

## 2019-02-10 DIAGNOSIS — I5032 Chronic diastolic (congestive) heart failure: Secondary | ICD-10-CM | POA: Diagnosis present

## 2019-02-10 DIAGNOSIS — I2583 Coronary atherosclerosis due to lipid rich plaque: Secondary | ICD-10-CM

## 2019-02-10 DIAGNOSIS — R109 Unspecified abdominal pain: Secondary | ICD-10-CM | POA: Diagnosis present

## 2019-02-10 DIAGNOSIS — R059 Cough, unspecified: Secondary | ICD-10-CM

## 2019-02-10 DIAGNOSIS — Z9079 Acquired absence of other genital organ(s): Secondary | ICD-10-CM

## 2019-02-10 DIAGNOSIS — Z8249 Family history of ischemic heart disease and other diseases of the circulatory system: Secondary | ICD-10-CM

## 2019-02-10 DIAGNOSIS — Z8673 Personal history of transient ischemic attack (TIA), and cerebral infarction without residual deficits: Secondary | ICD-10-CM

## 2019-02-10 DIAGNOSIS — Z1159 Encounter for screening for other viral diseases: Secondary | ICD-10-CM

## 2019-02-10 DIAGNOSIS — N183 Chronic kidney disease, stage 3 (moderate): Secondary | ICD-10-CM | POA: Diagnosis present

## 2019-02-10 DIAGNOSIS — Z87440 Personal history of urinary (tract) infections: Secondary | ICD-10-CM

## 2019-02-10 DIAGNOSIS — N3289 Other specified disorders of bladder: Secondary | ICD-10-CM | POA: Diagnosis present

## 2019-02-10 DIAGNOSIS — Z79899 Other long term (current) drug therapy: Secondary | ICD-10-CM

## 2019-02-10 DIAGNOSIS — C61 Malignant neoplasm of prostate: Secondary | ICD-10-CM | POA: Diagnosis present

## 2019-02-10 DIAGNOSIS — R05 Cough: Secondary | ICD-10-CM

## 2019-02-10 DIAGNOSIS — Z66 Do not resuscitate: Secondary | ICD-10-CM | POA: Diagnosis not present

## 2019-02-10 DIAGNOSIS — G893 Neoplasm related pain (acute) (chronic): Secondary | ICD-10-CM | POA: Diagnosis present

## 2019-02-10 DIAGNOSIS — N179 Acute kidney failure, unspecified: Secondary | ICD-10-CM | POA: Diagnosis present

## 2019-02-10 DIAGNOSIS — N390 Urinary tract infection, site not specified: Secondary | ICD-10-CM | POA: Diagnosis present

## 2019-02-10 DIAGNOSIS — Z86718 Personal history of other venous thrombosis and embolism: Secondary | ICD-10-CM

## 2019-02-10 DIAGNOSIS — E860 Dehydration: Secondary | ICD-10-CM | POA: Diagnosis present

## 2019-02-10 DIAGNOSIS — R319 Hematuria, unspecified: Secondary | ICD-10-CM | POA: Diagnosis present

## 2019-02-10 DIAGNOSIS — D649 Anemia, unspecified: Secondary | ICD-10-CM | POA: Diagnosis present

## 2019-02-10 DIAGNOSIS — I10 Essential (primary) hypertension: Secondary | ICD-10-CM | POA: Diagnosis present

## 2019-02-10 DIAGNOSIS — K08409 Partial loss of teeth, unspecified cause, unspecified class: Secondary | ICD-10-CM | POA: Diagnosis present

## 2019-02-10 DIAGNOSIS — R6520 Severe sepsis without septic shock: Secondary | ICD-10-CM

## 2019-02-10 LAB — CBC WITH DIFFERENTIAL/PLATELET
Abs Immature Granulocytes: 0.04 10*3/uL (ref 0.00–0.07)
Basophils Absolute: 0 10*3/uL (ref 0.0–0.1)
Basophils Relative: 0 %
Eosinophils Absolute: 0.1 10*3/uL (ref 0.0–0.5)
Eosinophils Relative: 1 %
HCT: 27 % — ABNORMAL LOW (ref 39.0–52.0)
Hemoglobin: 7.9 g/dL — ABNORMAL LOW (ref 13.0–17.0)
Immature Granulocytes: 0 %
Lymphocytes Relative: 8 %
Lymphs Abs: 0.9 10*3/uL (ref 0.7–4.0)
MCH: 23.7 pg — ABNORMAL LOW (ref 26.0–34.0)
MCHC: 29.3 g/dL — ABNORMAL LOW (ref 30.0–36.0)
MCV: 81.1 fL (ref 80.0–100.0)
Monocytes Absolute: 1.3 10*3/uL — ABNORMAL HIGH (ref 0.1–1.0)
Monocytes Relative: 12 %
Neutro Abs: 8.5 10*3/uL — ABNORMAL HIGH (ref 1.7–7.7)
Neutrophils Relative %: 79 %
Platelets: 348 10*3/uL (ref 150–400)
RBC: 3.33 MIL/uL — ABNORMAL LOW (ref 4.22–5.81)
RDW: 19.3 % — ABNORMAL HIGH (ref 11.5–15.5)
WBC: 10.9 10*3/uL — ABNORMAL HIGH (ref 4.0–10.5)
nRBC: 0 % (ref 0.0–0.2)

## 2019-02-10 LAB — BASIC METABOLIC PANEL
Anion gap: 13 (ref 5–15)
BUN: 44 mg/dL — ABNORMAL HIGH (ref 6–20)
CO2: 24 mmol/L (ref 22–32)
Calcium: 8.7 mg/dL — ABNORMAL LOW (ref 8.9–10.3)
Chloride: 105 mmol/L (ref 98–111)
Creatinine, Ser: 1.9 mg/dL — ABNORMAL HIGH (ref 0.61–1.24)
GFR calc Af Amer: 46 mL/min — ABNORMAL LOW (ref >60)
GFR calc non Af Amer: 40 mL/min — ABNORMAL LOW (ref >60)
Glucose, Bld: 126 mg/dL — ABNORMAL HIGH (ref 70–99)
Potassium: 3.8 mmol/L (ref 3.5–5.1)
Sodium: 142 mmol/L (ref 135–145)

## 2019-02-10 LAB — URINALYSIS, MICROSCOPIC (REFLEX)
RBC / HPF: >50 RBC/hpf (ref 0–5)
Squamous Epithelial / HPF: NONE SEEN (ref 0–5)

## 2019-02-10 LAB — URINALYSIS, ROUTINE W REFLEX MICROSCOPIC

## 2019-02-10 LAB — SARS CORONAVIRUS 2 BY RT PCR (HOSPITAL ORDER, PERFORMED IN ~~LOC~~ HOSPITAL LAB): SARS Coronavirus 2: NEGATIVE

## 2019-02-10 LAB — LACTIC ACID, PLASMA: Lactic Acid, Venous: 1 mmol/L (ref 0.5–1.9)

## 2019-02-10 MED ORDER — METOPROLOL TARTRATE 25 MG PO TABS
25.0000 mg | ORAL_TABLET | Freq: Two times a day (BID) | ORAL | Status: DC
  Administered 2019-02-10 – 2019-02-15 (×10): 25 mg via ORAL
  Filled 2019-02-10 (×10): qty 1

## 2019-02-10 MED ORDER — MORPHINE SULFATE ER 30 MG PO TBCR
30.0000 mg | EXTENDED_RELEASE_TABLET | Freq: Three times a day (TID) | ORAL | Status: DC
  Administered 2019-02-11 – 2019-02-15 (×13): 30 mg via ORAL
  Filled 2019-02-10: qty 1
  Filled 2019-02-10: qty 2
  Filled 2019-02-10 (×8): qty 1
  Filled 2019-02-10: qty 2
  Filled 2019-02-10 (×4): qty 1

## 2019-02-10 MED ORDER — SODIUM CHLORIDE 0.9 % IV SOLN
1.0000 g | INTRAVENOUS | Status: DC
  Administered 2019-02-10: 1 g via INTRAVENOUS
  Filled 2019-02-10: qty 10

## 2019-02-10 MED ORDER — CEPHALEXIN 500 MG PO CAPS: 500.0000 mg | ORAL_CAPSULE | Freq: Two times a day (BID) | ORAL | 0 refills | Status: DC

## 2019-02-10 MED ORDER — SODIUM CHLORIDE 0.9 % IV SOLN
INTRAVENOUS | Status: DC
  Administered 2019-02-10: 20 mL/h via INTRAVENOUS

## 2019-02-10 MED ORDER — MIRABEGRON ER 25 MG PO TB24
25.0000 mg | ORAL_TABLET | Freq: Every day | ORAL | Status: DC
  Administered 2019-02-12 – 2019-02-15 (×4): 25 mg via ORAL
  Filled 2019-02-10 (×5): qty 1

## 2019-02-10 MED ORDER — BELLADONNA ALKALOIDS-OPIUM 16.2-60 MG RE SUPP
1.0000 | Freq: Three times a day (TID) | RECTAL | Status: DC | PRN
  Administered 2019-02-10 – 2019-02-13 (×4): 1 via RECTAL
  Filled 2019-02-10 (×5): qty 1

## 2019-02-10 NOTE — ED Triage Notes (Signed)
He tells [place] he was just released from our hospital earlier today. He states he was hospitalized "because of prostate cancer". He returns with c/o chronic foley cath. Is clogged, causing much bladder pressure and discomfort. He is alternately sitting and standing to tolerate the pain.

## 2019-02-10 NOTE — Discharge Instructions (Addendum)
If the antibiotic you are taking is bactrim (trimethoprim/sulfamethoxazole) then you need to stop it and start taking the new antibotic instead.  If you are taking a different antibiotic please keep taking it.  Please contact hospice and have them reevaluate you.  It is important that you are drinking additional fluids.   If you develop fevers or worsening symptoms please contact hospice or seek additional medical care.   Your CT scan did show that you appear to be constipated.  I would recommend taking 1 capful of MiraLAX in 8 ounces of water every day.  If you are already doing this then please take an extra dose every day.  Constipation can cause abdominal pain.  It may take 1 to 3 days for the MiraLAX to produce a bowel movement.

## 2019-02-10 NOTE — H&P (Addendum)
Dylan Gonzalez JSC:[id number] DOB: [birth year] DOA: 02/10/2019     PCP: [name], MD   Outpatient Specialists:  Dr. Tomasa Hosteller hospice MD    Oncology  Dr. Alen Blew   Urology  Dr. Jeffie Pollock  Patient arrived to ER on 02/10/19 at 56  Patient coming from: home Lives alone but his wife come to help out    Chief Complaint:  Chief Complaint  Patient presents with   Hematuria    HPI: Dylan Gonzalez is a 53 y.o. 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 He feels it is due to his Foley not properly empty  Yesterday in the emergency department for dehydration and decreased urinary output found to have acute kidney injury and UA worrisome for urinary tract infection treated with IV antibiotics patient preferred to stay at home with oral fluid rehydration and continue to biotics at home he was discharged on Bactrim and Keflex Today he came back because of bleeding around the Foley site, low grade fevers at home  Is called  and causing a lot of bladder pressure and discomfort  Reports he has Dr. Jeffie Pollock 10 days ago gave him some sample for medication to try to stop the bleeding it helped but now seemed to recurrent.    Infectious risk factors:  Reports fever  In  ER RAPID COVID TEST NEGATIVE   Regarding pertinent Chronic problems:   prostate leiomyosarcoma  S/P transrectal prostate ultrasound and a prostate biopsy as well as a cystoscopy with insertion of double-J stent and transurethral resection of the prostate completed by Dr. Jeffie Pollock on April 13, 2018.  Taxotere with gemcitabine started on 05/30/2018.  He is status post 6 cycles of therapy.  continues to have decline in his performance status and worsening overall appetite and  fatigue.   currently receiving hospice services in the home     Hyperlipidemia - not on statins   HTN on HCTZ, lopressor   CHF diastolic  - last echo  88/64/8472 LV EF: 50% -   55%  (grade 2 diastolic dysfunction). On Lasix    Hx of CVA -  With out residual deficit      CKD stage III - baseline Cr 1.19   While in ER:  noted to be tachycardic up to 132 Rectal temp 100.5 Bladder scan showed 348 mL's Foley was replaced in ER The following Work up has been ordered so far:  Orders Placed This Encounter  Procedures   Urine Culture   SARS Coronavirus 2 (CEPHEID - Performed in [place] hospital lab), [facility] Order   Culture, blood (Routine X 2) w Reflex to ID Panel   DG Chest 2 View   CBC with Differential/Platelet   Basic metabolic panel   Urinalysis, Routine w reflex microscopic   Lactic acid, plasma   Urinalysis, Microscopic (reflex)   Check Rectal Temperature   Cardiac monitoring   Consult to hospitalist   Nutritional services consult   ED EKG   EKG 12-Lead   EKG 12-Lead   Type and screen [place] in observation (patient's expected length of stay will be less than 2 midnights)    Following Medications were ordered in ER: Medications  0.9 %  sodium chloride  infusion (20 mL/hr Intravenous New Bag/Given 02/10/19 1843)  cefTRIAXone (ROCEPHIN) 1 g in sodium chloride 0.9 % 100 mL IVPB (1 g Intravenous New Bag/Given 02/10/19 1844)  morphine (MS CONTIN) 12 hr tablet 30 mg (has no administration in time range)  metoprolol tartrate (LOPRESSOR) tablet 25 mg (has no administration in time range)        Consult Orders  (From admission, onward)         Start     Ordered   02/10/19 2222  Nutritional services consult  Once    Provider:  (Not yet assigned)  Question:  Reason for Consult?  Answer:  malnutrition   02/10/19 2222   02/10/19 2023  Consult to hospitalist  Once    Provider:  (Not yet assigned)  Question Answer Comment   Place call to: [facility]   Reason for Consult Admit      02/10/19 2022           Significant initial  Findings: Abnormal Labs Reviewed  CBC WITH DIFFERENTIAL/PLATELET - Abnormal; Notable for the following components:      Result Value   WBC 10.9 (*)    RBC 3.33 (*)    Hemoglobin 7.9 (*)    HCT 27.0 (*)    MCH 23.7 (*)    MCHC 29.3 (*)    RDW 19.3 (*)    Neutro Abs 8.5 (*)    Monocytes Absolute 1.3 (*)    All other components within normal limits  BASIC METABOLIC PANEL - Abnormal; Notable for the following components:   Glucose, Bld 126 (*)    BUN 44 (*)    Creatinine, Ser 1.90 (*)    Calcium 8.7 (*)    GFR calc non Af Amer 40 (*)    GFR calc Af Amer 46 (*)    All other components within normal limits  URINALYSIS, ROUTINE W REFLEX MICROSCOPIC - Abnormal; Notable for the following components:   Color, Urine RED (*)    APPearance TURBID (*)    Glucose, UA   (*)    Value: TEST NOT REPORTED DUE TO COLOR INTERFERENCE OF URINE PIGMENT   Hgb urine dipstick   (*)    Value: TEST NOT REPORTED DUE TO COLOR INTERFERENCE OF URINE PIGMENT   Bilirubin Urine   (*)    Value: TEST NOT REPORTED DUE TO COLOR INTERFERENCE OF URINE PIGMENT   Ketones, ur   (*)    Value: TEST NOT REPORTED DUE TO COLOR INTERFERENCE OF URINE PIGMENT   Protein, ur   (*)    Value: TEST NOT REPORTED DUE TO COLOR INTERFERENCE OF URINE PIGMENT   Nitrite   (*)    Value: TEST NOT REPORTED DUE TO COLOR INTERFERENCE OF URINE PIGMENT   Leukocytes,Ua   (*)    Value: TEST NOT REPORTED DUE TO COLOR INTERFERENCE OF URINE PIGMENT   All other components within normal limits  URINALYSIS, MICROSCOPIC (REFLEX) - Abnormal; Notable for the following components:   Bacteria, UA RARE (*)    All other components within normal limits     Otherwise labs showing:    Recent Labs  Lab 02/09/19 2053 02/10/19 1749  NA 129* 142  K 3.5 3.8  CO2 27 24  GLUCOSE 106* 126*  BUN 47* 44*  CREATININE 2.35* 1.90*  CALCIUM  8.0* 8.7*    Cr ,  Up from baseline see below but down from yesterday Lab Results  Component Value Date   CREATININE 1.90 (H) 02/10/2019  CREATININE 2.35 (H) 02/09/2019   CREATININE 1.19 11/10/2018    Recent Labs  Lab 02/09/19 2053  AST 17  ALT 11  ALKPHOS 58  BILITOT 0.6  PROT 8.1  ALBUMIN 2.9*   Lab Results  Component Value Date   CALCIUM 8.7 (L) 02/10/2019   PHOS 3.6 08/05/2018      WBC      Component Value Date/Time   WBC 10.9 (H) 02/10/2019 1749   ANC    Component Value Date/Time   NEUTROABS 8.5 (H) 02/10/2019 1749     Plt: Lab Results  Component Value Date   PLT 348 02/10/2019     Lactic Acid, Venous    Component Value Date/Time   LATICACIDVEN 1.0 02/10/2019 1749      COVID-19 Labs     Lab Results  Component Value Date   SARSCOV2NAA NEGATIVE 02/10/2019      HG/HCT  Stable,      Component Value Date/Time   HGB 7.9 (L) 02/10/2019 1749   HGB 8.1 (L) 10/30/2018 0906   HCT 27.0 (L) 02/10/2019 1749    Recent Labs  Lab 02/09/19 2053  LIPASE 18     BNP (last 3 results) Recent Labs    08/13/18 1845 10/21/18 1420 11/10/18 1542  BNP 43.3 234.6* 100.7*     UA turbid  unable to interpret   Urine analysis:    Component Value Date/Time   COLORURINE RED (A) 02/10/2019 1655   APPEARANCEUR TURBID (A) 02/10/2019 1655   LABSPEC  02/10/2019 1655    TEST NOT REPORTED DUE TO COLOR INTERFERENCE OF URINE PIGMENT   PHURINE  02/10/2019 1655    TEST NOT REPORTED DUE TO COLOR INTERFERENCE OF URINE PIGMENT   GLUCOSEU (A) 02/10/2019 1655    TEST NOT REPORTED DUE TO COLOR INTERFERENCE OF URINE PIGMENT   HGBUR (A) 02/10/2019 1655    TEST NOT REPORTED DUE TO COLOR INTERFERENCE OF URINE PIGMENT   BILIRUBINUR (A) 02/10/2019 1655    TEST NOT REPORTED DUE TO COLOR INTERFERENCE OF URINE PIGMENT   BILIRUBINUR neg 06/01/2014 1333   KETONESUR (A) 02/10/2019 1655    TEST NOT REPORTED DUE TO COLOR INTERFERENCE OF URINE PIGMENT   PROTEINUR (A) 02/10/2019  1655    TEST NOT REPORTED DUE TO COLOR INTERFERENCE OF URINE PIGMENT   UROBILINOGEN 0.2 04/19/2015 0907   NITRITE (A) 02/10/2019 1655    TEST NOT REPORTED DUE TO COLOR INTERFERENCE OF URINE PIGMENT   LEUKOCYTESUR (A) 02/10/2019 1655    TEST NOT REPORTED DUE TO COLOR INTERFERENCE OF URINE PIGMENT    Ct head from 6/5 - nonacute CXR -cardiomegaly and pulmonary venous congestion  CTabd/pelvis from 6/5 - large prostate mass , metastatic nodule in the lungs Monitor stool Double-J ureteral stents in place IVC filter   ECG:  Personally reviewed by me showing: HR : 124 Rhythm:   Sinus tachycardia   no evidence of ischemic changes QTC 480       ED Triage Vitals  Enc Vitals Group     BP 02/10/19 1640 (!) 184/120     Pulse Rate 02/10/19 1640 (!) 132     Resp 02/10/19 1640 18     Temp 02/10/19 1640 99.7 F (37.6 C)     Temp Source 02/10/19 1640 Oral     SpO2 02/10/19 1640 100 %     Weight --      Height --      Head Circumference --      Peak  Flow --      Pain Score 02/10/19 1641 8     Pain Loc --      Pain Edu? --      Excl. in [place]? --   TMAX(24)@       Latest  Blood pressure 119/83, pulse (!) 103, temperature (!) 100.5 F (38.1 C), temperature source Rectal, resp. rate (!) 25, SpO2 96 %.    Hospitalist was called for admission for UTI, sepsis  Review of Systems:    Pertinent positives include:  fatigue,  abdominal pain  Constitutional:  No weight loss, night sweats, Fevers, chillsweight loss  HEENT:  No headaches, Difficulty swallowing,Tooth/dental problems,Sore throat,  No sneezing, itching, ear ache, nasal congestion, post nasal drip,  Cardio-vascular:  No chest pain, Orthopnea, PND, anasarca, dizziness, palpitations.no Bilateral lower extremity swelling  GI:  No heartburn, indigestion,, nausea, vomiting, diarrhea, change in bowel habits, loss of appetite, melena, blood in stool, hematemesis Resp:  no shortness of breath at rest. No dyspnea on exertion, No excess  mucus, no productive cough, No non-productive cough, No coughing up of blood.No change in color of mucus.No wheezing. Skin:  no rash or lesions. No jaundice GU:  no dysuria, change in color of urine, no urgency or frequency. No straining to urinate.  No flank pain.  Musculoskeletal:  No joint pain or no joint swelling. No decreased range of motion. No back pain.  Psych:  No change in mood or affect. No depression or anxiety. No memory loss.  Neuro: no localizing neurological complaints, no tingling, no weakness, no double vision, no gait abnormality, no slurred speech, no confusion  All systems reviewed and apart from [place] all are negative  Past Medical History:   Past Medical History:  Diagnosis Date   Bilateral lower extremity edema    Chronic diastolic congestive heart failure ([place]) 07/2018   CKD (chronic kidney disease), stage II    DOE (dyspnea on exertion)    Foley catheter in place    Hematuria    History of CVA (cerebrovascular accident) 2007   per pt no residuals   History of DVT of lower extremity 06/2018   left common femoral vein,  started on anticoagulant , stopped due to hematuria,  IVC filter placed 08-16-2018   History of sepsis 09/28/2018   uti   Hypertension    Malignant neoplasm metastatic to lung ([place]) 04/19/2018   Port-A-Cath in place 06/21/2018   Prostate cancer metastatic to lung [facility]) urologist-  dr wrenn/  oncologist-- dr Alen Blew   dx 08/ 2019,  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,  started chemo therapy 05-30-2018   Retinoblastoma, 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   S/P IVC filter 08/16/2018   Urinary retention 09/28/2018   CHRONIC FOLEY CATH      Past Surgical History:  Procedure Laterality Date   CYSTOSCOPY W/ URETERAL STENT PLACEMENT Bilateral 04/13/2018   Procedure: CYSTOSCOPY WITH BILATERAL STENT REPLACEMENT;  Surgeon: Irine Seal, MD;  Location: [facility];  Service: Urology;  Laterality:  Bilateral;   CYSTOSCOPY WITH STENT PLACEMENT Bilateral 10/19/2018   Procedure: CYSTOSCOPY WITH STENT EXCHANGE Bilateral;  Surgeon: Irine Seal, MD;  Location: [facility];  Service: Urology;  Laterality: Bilateral;   ENUCLEATION Left 1976   removal left eye due to Retinoplastoma   INTRAOCULAR PROSTHESES INSERTION     IR IMAGING GUIDED PORT INSERTION  06/21/2018   IR IVC FILTER PLMT / S&I /IMG GUID/MOD SED  08/16/2018   LAPAROSCOPIC  INGUINAL HERNIA REPAIR Right 11-19-2010   dr d. Ninfa Linden  @[facility]    AND UMBILICAL HERNIA REPAIR   PROSTATE BIOPSY [name] 04/13/2018   Procedure: ULTRASOUND GUIDED PROSTATE BIOPSY;  Surgeon: Irine Seal, MD;  Location: [facility];  Service: Urology;  Laterality: [name];   TOOTH EXTRACTION Right 04/21/2013   Procedure: EXTRACTION MOLARS;  Surgeon: [name], DDS;  Location: [facility];  Service: Oral Surgery;  Laterality: Right;   TRANSURETHRAL RESECTION OF PROSTATE  04/13/2018   Procedure: TRANSURETHRAL RESECTION OF THE PROSTATE (TURP);  Surgeon: Irine Seal, MD;  Location: [facility];  Service: Urology;;    Social History:  Ambulatory   independently or cane      reports that he has never smoked. He has never used smokeless tobacco. He reports that he does not drink alcohol or use drugs.     Family History:   Family History  Problem Relation Age of Onset   Hypertension Father    Depression Brother     Allergies: Allergies  Allergen Reactions   Dilaudid [Hydromorphone Hcl] Hives, Itching, Rash and Other (See Comments)    Causes un-controllable shakes   Pollen Extract Other (See Comments)    Sneezing and shortness of breath     Prior to Admission medications   Medication Sig Start Date End Date Taking? Authorizing Provider  acetaminophen (TYLENOL) 500 MG tablet Take 1,000 mg by mouth 2 (two) times daily as needed for mild pain or moderate pain.    [provider]  albuterol (PROVENTIL HFA) 108 (90 Base) MCG/ACT inhaler Inhale 2 puffs into the lungs  every 6 (six) hours as needed for wheezing or shortness of breath.     [provider]  Albuterol Sulfate 2.5 MG/0.5ML NEBU Inhale 1 each into the lungs every 6 (six) hours as needed (shortness of breath/wheezing).    [provider]  cephALEXin (KEFLEX) 500 MG capsule Take 1 capsule (500 mg total) by mouth 2 (two) times daily. 02/10/19   Lorin Glass, PA-C  diazepam (VALIUM) 5 MG tablet Take 5 mg by mouth 3 (three) times daily.    [provider]  escitalopram (LEXAPRO) 10 MG tablet Take 10 mg by mouth daily.    [provider]  fluticasone (FLONASE) 50 MCG/ACT nasal spray PLACE 2 SPRAYS INTO BOTH NOSTRILS DAILY. Patient taking differently: Place 2 sprays into both nostrils daily.  07/22/14   Le, Thao P, DO  gabapentin (NEURONTIN) 300 MG capsule Take 300 mg by mouth 3 (three) times daily.    [provider]  hydrOXYzine (VISTARIL) 100 MG capsule TAKE ONE CAPSULE BY MOUTH AT BEDTIME AS NEEDED FOR ALLERGY Patient not taking: Reported on 11/10/2018 10/12/15   Robyn Haber, MD  LORazepam (ATIVAN) 0.5 MG tablet Take 0.5 mg by mouth every 4 (four) hours as needed for anxiety.  11/03/18   [provider]  metoprolol tartrate (LOPRESSOR) 50 MG tablet Take 50 mg by mouth daily.     [provider]  mirabegron ER (MYRBETRIQ) 25 MG TB24 tablet Take 25 mg by mouth daily.    [provider]  morphine (MS CONTIN) 30 MG 12 hr tablet Take 1 tablet (30 mg total) by mouth every 12 (twelve) hours. Patient taking differently: Take 30 mg by mouth 3 (three) times daily.  10/30/18   [name], MD  ondansetron (ZOFRAN) 8 MG tablet Take 1 tablet (8 mg total) by mouth every 8 (eight) hours as needed for nausea or vomiting. Patient not taking:  Reported on 11/10/2018 07/28/18   [name], MD  oxyCODONE (OXY IR/ROXICODONE) 5 MG immediate release tablet Take 5-20 mg by mouth See admin instructions. Every 4 hours as needed for breakthrough  pain:  64m for mild pain 10 mg for moderate pain 20 mg for severe pain    [provider]  polyethylene glycol (MIRALAX / GLYCOLAX) packet Take 17 g by mouth 2 (two) times daily as needed for mild constipation or moderate constipation. Patient taking differently: Take 17 g by mouth daily as needed for mild constipation or moderate constipation.  04/23/18   POrpah Greek MD  polyethylene glycol powder (MIRALAX) powder Take 255 g by mouth daily. Take 8 cap fulls of miralax in 1 32 oz gatorade over 2-3 hours, then continue to use 1 cap full daily Patient taking differently: Take 17 g by mouth daily. 1 cap full once a day 10/21/18   FJacqlyn Larsen PA-C  prochlorperazine (COMPAZINE) 10 MG tablet Take 10 mg by mouth every 6 (six) hours as needed for nausea or vomiting.    [provider]  sennosides-docusate sodium (SENOKOT-S) 8.6-50 MG tablet Take 1 tablet by mouth daily as needed for constipation.    [provider]   Physical Exam: Blood pressure 119/83, pulse (!) 103, temperature (!) 100.5 F (38.1 C), temperature source Rectal, resp. rate (!) 25, SpO2 96 %. 1. General:  in No  Acute distress    Chronically ill  -appearing 2. Psychological: Alert and   Oriented 3. Head/ENT:     Dry Mucous Membranes                          Head Non traumatic, neck supple                            Poor Dentition 4. SKIN:  decreased Skin turgor,  Skin clean Dry and intact no rash 5. Heart: Regular rate and rhythm no Murmur, no Rub or gallop 6. Lungs:  no wheezes or crackles   7. Abdomen: Soft, tender, palpable suprapubic mass Non distended  bowel sounds present, foley in place 8. Lower extremities: no clubbing, cyanosis, no  edema 9. Neurologically Grossly intact, moving all 4 extremities equally  10. MSK: Normal range of motion   All other LABS:     Recent Labs  Lab 02/09/19 2053 02/10/19 1749  WBC 12.0* 10.9*  NEUTROABS 9.3* 8.5*  HGB 7.3* 7.9*  HCT 25.0*  27.0*  MCV 80.4 81.1  PLT 342 348     Recent Labs  Lab 02/09/19 2053 02/10/19 1749  NA 129* 142  K 3.5 3.8  CL 93* 105  CO2 27 24  GLUCOSE 106* 126*  BUN 47* 44*  CREATININE 2.35* 1.90*  CALCIUM 8.0* 8.7*     Recent Labs  Lab 02/09/19 2053  AST 17  ALT 11  ALKPHOS 58  BILITOT 0.6  PROT 8.1  ALBUMIN 2.9*       Cultures:    Component Value Date/Time   SDES  10/21/2018 1420    URINE, RANDOM [facility] at [facility] [address], [place] [id number]   SPECREQUEST  10/21/2018 1420    NONE [facility]formed at [facility] [address] [address], [place] [address]   CULT MULTIPLE SPECIES PRESENT, SUGGEST RECOLLECTION (A) 10/21/2018 1420   REPTSTATUS 10/23/2018 FINAL 10/21/2018 1420  Radiological Exams on Admission: Dg Chest 2 View  Result Date: 02/10/2019 CLINICAL DATA:  54 year old male with history of prostate cancer. EXAM: CHEST - 2 VIEW COMPARISON:  Chest x-ray 11/10/2018. FINDINGS: Right internal jugular single-lumen power porta cath with tip terminating at the superior cavoatrial junction. Lung volumes are low. No consolidative airspace disease. No pleural effusions. No pneumothorax. No definite suspicious appearing pulmonary nodules or masses. Pulmonary venous congestion, without frank pulmonary edema. Moderate cardiomegaly. Upper mediastinal contours are within normal limits. Aortic atherosclerosis. IMPRESSION: 1. Low lung volumes with cardiomegaly and pulmonary venous congestion, but no frank pulmonary edema. 2. Aortic atherosclerosis. Electronically Signed   By: Vinnie Langton M.D.   On: 02/10/2019 20:13   Ct Head Wo Contrast  Result Date: 02/09/2019 CLINICAL DATA:  Weakness EXAM: CT HEAD WITHOUT CONTRAST TECHNIQUE: Contiguous axial images were obtained from the base of the skull through the vertex without intravenous contrast. COMPARISON:  CT head dated 02/02/2014. FINDINGS: Brain: No evidence of acute  infarction, hemorrhage, hydrocephalus, extra-axial collection or mass lesion/mass effect. There is age related volume loss. There are advanced chronic microvascular ischemic changes bilaterally. Vascular: No hyperdense vessel or unexpected calcification. Skull: Normal. Negative for fracture or focal lesion. Sinuses/Orbits: No acute finding. Postsurgical changes are noted of the left orbit. Other: None. IMPRESSION: 1. No acute intracranial abnormality detected. 2. Chronic microvascular ischemic changes with age related volume loss, progressed from 82. Electronically Signed   By: Constance Holster M.D.   On: 02/09/2019 23:35   Ct Renal Stone Study  Result Date: 02/09/2019 CLINICAL DATA:  Bladder and prostate cancer with increasing left lower quadrant abdominal pain. EXAM: CT ABDOMEN AND PELVIS WITHOUT CONTRAST TECHNIQUE: Multidetector CT imaging of the abdomen and pelvis was performed following the standard protocol without IV contrast. COMPARISON:  None. FINDINGS: Lower chest: There is a 1.7 cm pulmonary nodule in the left lower lobe. This has increased significantly in size from prior study. Heart size is significantly enlarged. There is a trace right-sided pleural effusion. Hepatobiliary: No focal liver abnormality is seen. No gallstones, gallbladder wall thickening, or biliary dilatation. Pancreas: Unremarkable. No pancreatic ductal dilatation or surrounding inflammatory changes. Spleen: Normal in size without focal abnormality. Adrenals/Urinary Tract: There are bilateral double-J ureteral stents in place. There is some mild bilateral collecting system dilatation. The stents appear to terminate in the decompressed urinary bladder. There is a Foley catheter in place. Stomach/Bowel: The stomach is unremarkable. There is a moderate amount of stool in the colon. The appendix is unremarkable. There is no evidence of a small-bowel obstruction. Vascular/Lymphatic: An IVC filter is in place. There are some prominent  but subcentimeter retroperitoneal lymph nodes. There are prominent inguinal and pelvic lymph nodes. Reproductive: There is a large prostate mass currently measuring approximately 7.8 by 8.4 cm (previously measuring 10 x 11 cm. There is a central low attenuation area measuring approximately 21 Hounsfield units. This may represent a necrotic portion of the tumor. Other: No abdominal wall hernia or abnormality. No abdominopelvic ascites. Musculoskeletal: No acute or significant osseous findings. IMPRESSION: 1. Examination limited by lack of IV contrast. 2. Dominant pelvic mass with interval decrease in size consistent with the patient's known prostate cancer. There is a central low-attenuation area which may represent necrosis. 3. Significant interval increase in size of a left lower lobe pulmonary nodule currently measuring approximately 1.6 cm. This is consistent with metastatic disease. 4. Cardiomegaly.  There is a trace right-sided pleural effusion. 5. Moderate amount of stool throughout the colon. 6. Bilateral  double-J ureteral stents are in place. There is mild symmetric bilateral collecting system dilatation without frank hydronephrosis. 7. IVC filter in place. Electronically Signed   By: Constance Holster M.D.   On: 02/09/2019 23:43    Chart has been reviewed   Assessment/Plan   53 y.o. 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    without evidence of end organ damage    -Most likely source being  Urinary,   - Obtain serial lactic acid and procalcitonin level.  - Initiate IV antibiotics   - await results of blood and urine culture  - Rehydrate  gently No Evidence of hypotension at this time   Primary leiomyosarcoma of intra-abdominal site [facility]) -currently on hospice notify Dr. Alen Blew patient has been admitted overall poor prognosis  Malignant neoplasm metastatic to lung ([facility])-CT scan showing slightly enlarging lesion.  Continue to monitor patient currently on hospice no oxygen requirement at this time  CAD (coronary artery disease) -chronic stable not on aspirin given hematuria continue statin and beta-blocker if able to tolerate  Acute UTI (urinary tract infection) -await results of urine culture cover with cefepime for now  Anemia-chronic in the setting of chronic hematuria currently stable continue to monitor  Abdominal pain-secondary to poor emptying off of Foley secondary to hematuria improved after Foley was replaced in the emergency department continue to monitor  Chronic diastolic CHF (congestive heart failure) (HCC)-we will need to be judicious with the use of IV fluid rehydration chest x-ray showing no pulmonary edema but some vascular congestion. Urinary retention-  continue Foley make sure it is draining well  Per CT scan noted large mass consistent with prostate cancer likely contributing to sensation of bladder distention will need symptomatic management would appreciate urology input   Hematuria -chronic secondary to prostate cancer continue to monitor make sure Foley is able to drain  Dehydration -we will gently rehydrate and follow  Hypertension -chronic resume metoprolol when able to tolerate currently somewhat soft blood pressures  Other plan as per orders.  DVT prophylaxis:  SCD    Code Status:  FULL CODE as per patient she is on hospice but still wishes to continue being full code at this time Will need discussion regarding overall goals of care he has not seen his oncologist since February but continues to see urology on a regular basis.  I had personally discussed CODE STATUS with patient  Family  Communication:   Family not at  Bedside    Disposition Plan:      To home once workup is complete and patient is stable                      Would benefit from PT/OT eval prior to DC  Ordered                                    Nutrition    consulted                  Wound care  consulted  Palliative care    consulted                                    Consults called:  notified Dr. Alen Blew through epic that pt has been admitted    Urology aware will see in AM  Admission status:  ED Disposition    ED Disposition Condition [place]: [place] [[id number]]  Level of Care: Telemetry [5]  Admit to tele based on following criteria: Other see comments  Comments: tachycardia  Covid Evaluation: [name]  Diagnosis: Sepsis ([place]) [[id number]]  Admitting Physician: Toy Baker [[id number]]  Attending Physician: Toy Baker [[id number]]  PT Class (Do Not Modify): Observation [104]  PT Acc Code (Do Not Modify): Observation [10022]        Obs    Level of care   Step down     Precautions:  NONE  No active isolations  PPE: Used by the provider:   P100  eye Goggles,  Gloves    Evah Rashid 02/10/2019, 10:28 PM    [facility]     after 2 AM please page floor coverage PA If 7AM-7PM, please contact the day team taking care of the patient using [email or web address]

## 2019-02-10 NOTE — ED Notes (Signed)
Bladder Scan: 348ML

## 2019-02-10 NOTE — ED Notes (Signed)
Bed: IO97 Expected date: 02/10/19 Expected time: 4:26 PM Means of arrival: Ambulance Comments: Medic 33  53 yo M bleeding around foley

## 2019-02-10 NOTE — ED Notes (Signed)
Pt was able to eat 2 packs of crackers and a ginger ale. Pt denies N/V, states he feels fine after eating.

## 2019-02-10 NOTE — ED Notes (Signed)
Pt stated he had a "bladder spasm" and felt moisture around his penis. Writer of this note helped pt perform peri care and catheter due to the moderate amount of blood. Pt was changed into a gown and shirt, pants and underwear were placed in a belonging bag. Pt wished to keep a cross pin on his gown.

## 2019-02-10 NOTE — ED Provider Notes (Signed)
[place] DEPT Provider Note   CSN: [id number] Arrival date & time: 02/10/19  1623    History   Chief Complaint Chief Complaint  Patient presents with  . Hematuria    HPI Dylan Gonzalez is a 53 y.o. male.     53 year old male with history of metastatic prostate cancer on hospice who presents with leaking around his Foley catheter.  Currently being treated for a UTI and was seen in the ED yesterday for similar symptoms.  Extensive work-up done at that time and was placed on Bactrim along with Keflex.  He notes suprapubic pressure without fever or chills.  No flank pain.  Presents via EMS     Past Medical History:  Diagnosis Date  . Bilateral lower extremity edema   . Chronic diastolic congestive heart failure ([place]) 07/2018  . CKD (chronic kidney disease), stage II   . DOE (dyspnea on exertion)   . Foley catheter in place   . Hematuria   . History of CVA (cerebrovascular accident) 2007   per pt no residuals  . History of DVT of lower extremity 06/2018   left common femoral vein,  started on anticoagulant , stopped due to hematuria,  IVC filter placed 08-16-2018  . History of sepsis 09/28/2018   uti  . Hypertension   . Malignant neoplasm metastatic to lung ([place]) 04/19/2018  . Port-A-Cath in place 06/21/2018  . 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 S/P IVC filter 08/16/2018  . Urinary retention 09/28/2018   CHRONIC FOLEY CATH    Patient Active Problem List   Diagnosis Date Noted  . Abdominal pain 09/28/2018  . Sepsis ([place]) 09/28/2018  . Anemia 08/14/2018  . Symptomatic anemia 08/13/2018  . Acute UTI (urinary tract infection) 08/02/2018  . Deep vein thrombosis (DVT) of left lower extremity ([place]) 08/02/2018  .  Bilateral lower extremity edema 08/01/2018  . CAD (coronary artery disease) 08/01/2018  . Hypokalemia 08/01/2018  . Port-A-Cath in place 06/29/2018  . Lung nodules 05/24/2018  . Leiomyosarcoma ([place]) 05/23/2018  . Goals of care, counseling/discussion 05/15/2018  . Bilateral ureteral obstruction 04/19/2018  . Malignant neoplasm metastatic to lung ([place]) 04/19/2018  . 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 Prostate neoplasm 04/13/2018  . Hypertension 12/26/2011  . 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 CYSTOSCOPY W/ URETERAL STENT PLACEMENT Bilateral 04/13/2018   Procedure: CYSTOSCOPY WITH BILATERAL STENT REPLACEMENT;  Surgeon: Irine Seal, MD;  Location: [facility];  Service: Urology;  Laterality: Bilateral;  . CYSTOSCOPY WITH STENT PLACEMENT Bilateral 10/19/2018   Procedure: CYSTOSCOPY WITH STENT EXCHANGE Bilateral;  Surgeon: Irine Seal, MD;  Location: [facility];  Service: Urology;  Laterality: Bilateral;  . ENUCLEATION Left 1976   removal left eye due to Retinoplastoma  . INTRAOCULAR PROSTHESES INSERTION    . IR IMAGING GUIDED PORT INSERTION  06/21/2018  . IR IVC FILTER PLMT / S&I /IMG GUID/MOD SED  08/16/2018  . LAPAROSCOPIC INGUINAL HERNIA REPAIR Right 11-19-2010   dr d. Ninfa Linden  @[facility]    AND UMBILICAL HERNIA REPAIR  . PROSTATE BIOPSY [name] 04/13/2018   Procedure: ULTRASOUND GUIDED PROSTATE BIOPSY;  Surgeon: Irine Seal, MD;  Location: [facility];  Service: Urology;  Laterality:  [name];  . TOOTH EXTRACTION Right 04/21/2013   Procedure: EXTRACTION MOLARS;  Surgeon: [name], DDS;  Location: [place];  Service: Oral Surgery;  Laterality: Right;  . TRANSURETHRAL RESECTION OF PROSTATE  04/13/2018   Procedure: TRANSURETHRAL RESECTION OF THE PROSTATE (TURP);  Surgeon: Irine Seal, MD;  Location: [facility];  Service: Urology;;        Home Medications    Prior to Admission medications   Medication Sig Start Date End  Date Taking? Authorizing Provider  acetaminophen (TYLENOL) 500 MG tablet Take 1,000 mg by mouth 2 (two) times daily as needed for mild pain or moderate pain.    [provider]  albuterol (PROVENTIL HFA) 108 (90 Base) MCG/ACT inhaler Inhale 2 puffs into the lungs every 6 (six) hours as needed for wheezing or shortness of breath.     [provider]  Albuterol Sulfate 2.5 MG/0.5ML NEBU Inhale 1 each into the lungs every 6 (six) hours as needed (shortness of breath/wheezing).    [provider]  cephALEXin (KEFLEX) 500 MG capsule Take 1 capsule (500 mg total) by mouth 2 (two) times daily. 02/10/19   Lorin Glass, PA-C  diazepam (VALIUM) 5 MG tablet Take 5 mg by mouth 3 (three) times daily.    [provider]  escitalopram (LEXAPRO) 10 MG tablet Take 10 mg by mouth daily.    [provider]  fluticasone (FLONASE) 50 MCG/ACT nasal spray PLACE 2 SPRAYS INTO BOTH NOSTRILS DAILY. Patient taking differently: Place 2 sprays into both nostrils daily.  07/22/14   Le, Thao P, DO  gabapentin (NEURONTIN) 300 MG capsule Take 300 mg by mouth 3 (three) times daily.    [provider]  hydrOXYzine (VISTARIL) 100 MG capsule TAKE ONE CAPSULE BY MOUTH AT BEDTIME AS NEEDED FOR ALLERGY Patient not taking: Reported on 11/10/2018 10/12/15   Robyn Haber, MD  LORazepam (ATIVAN) 0.5 MG tablet Take 0.5 mg by mouth every 4 (four) hours as needed for anxiety.  11/03/18   [provider]  metoprolol tartrate (LOPRESSOR) 50 MG tablet Take 50 mg by mouth daily.     [provider]  mirabegron ER (MYRBETRIQ) 25 MG TB24 tablet Take 25 mg by mouth daily.    [provider]  morphine (MS CONTIN) 30 MG 12 hr tablet Take 1 tablet (30 mg total) by mouth every 12 (twelve) hours. Patient taking differently: Take 30 mg by mouth 3 (three) times daily.  10/30/18   [name], MD  ondansetron (ZOFRAN) 8 MG tablet Take 1 tablet (8 mg total) by mouth every 8  (eight) hours as needed for nausea or vomiting. Patient not taking: Reported on 11/10/2018 07/28/18   [name], MD  oxyCODONE (OXY IR/ROXICODONE) 5 MG immediate release tablet Take 5-20 mg by mouth See admin instructions. Every 4 hours as needed for breakthrough pain:  5mg  for mild pain 10 mg for moderate pain 20 mg for severe pain    [provider]  polyethylene glycol (MIRALAX / GLYCOLAX) packet Take 17 g by mouth 2 (two) times daily as needed for mild constipation or moderate constipation. Patient taking differently: Take 17 g by mouth daily as needed for mild constipation or moderate constipation.  04/23/18   Orpah Greek, MD  polyethylene glycol powder (MIRALAX) powder Take 255 g by mouth daily. Take 8 cap fulls of miralax in 1 32 oz gatorade over 2-3 hours, then continue to use 1 cap full daily Patient taking differently: Take  17 g by mouth daily. 1 cap full once a day 10/21/18   Jacqlyn Larsen, PA-C  prochlorperazine (COMPAZINE) 10 MG tablet Take 10 mg by mouth every 6 (six) hours as needed for nausea or vomiting.    [provider]  sennosides-docusate sodium (SENOKOT-S) 8.6-50 MG tablet Take 1 tablet by mouth daily as needed for constipation.    [provider]    Family History Family History  Problem Relation Age of Onset  . Hypertension Father   . Depression Brother     Social History Social History   Tobacco Use  . Smoking status: Never Smoker  . Smokeless tobacco: Never Used  Substance Use Topics  . Alcohol use: No  . Drug use: Never     Allergies   Dilaudid [hydromorphone hcl] and Pollen extract   Review of Systems Review of Systems  All other systems reviewed and are negative.    Physical Exam Updated Vital Signs BP (!) 184/120 (BP Location: Left Arm)   Pulse (!) 132   Temp 99.7 F (37.6 C) (Oral)   Resp 18   SpO2 100%   Physical Exam Vitals signs and nursing note reviewed.  Constitutional:      General:  He is not in acute distress.    Appearance: Normal appearance. He is well-developed. He is not toxic-appearing.  HENT:     Head: Normocephalic and atraumatic.  Eyes:     General: Lids are normal.     Conjunctiva/sclera: Conjunctivae normal.     Pupils: Pupils are equal, round, and reactive to light.  Neck:     Musculoskeletal: Normal range of motion and neck supple.     Thyroid: No thyroid mass.     Trachea: No tracheal deviation.  Cardiovascular:     Rate and Rhythm: Regular rhythm. Tachycardia present.     Heart sounds: Normal heart sounds. No murmur. No gallop.   Pulmonary:     Effort: Pulmonary effort is normal. No respiratory distress.     Breath sounds: Normal breath sounds. No stridor. No decreased breath sounds, wheezing, rhonchi or rales.  Abdominal:     General: Bowel sounds are normal. There is no distension.     Palpations: Abdomen is soft.     Tenderness: There is abdominal tenderness in the suprapubic area. There is guarding. There is no rebound.    Musculoskeletal: Normal range of motion.        General: No tenderness.  Skin:    General: Skin is warm and dry.     Findings: No abrasion or rash.  Neurological:     Mental Status: He is alert and oriented to person, place, and time.     GCS: GCS eye subscore is 4. GCS verbal subscore is 5. GCS motor subscore is 6.     Cranial Nerves: No cranial nerve deficit or dysarthria.     Sensory: No sensory deficit.     Motor: No tremor.  Psychiatric:        Mood and Affect: Affect is flat.      ED Treatments / Results  Labs (all labs ordered are listed, but only abnormal results are displayed) Labs Reviewed  URINE CULTURE  CBC WITH DIFFERENTIAL/PLATELET  BASIC METABOLIC PANEL  URINALYSIS, ROUTINE W REFLEX MICROSCOPIC    EKG None  Radiology Ct Head Wo Contrast  Result Date: 02/09/2019 CLINICAL DATA:  Weakness EXAM: CT HEAD WITHOUT CONTRAST TECHNIQUE: Contiguous axial images were obtained from the base of the  skull  through the vertex without intravenous contrast. COMPARISON:  CT head dated 02/02/2014. FINDINGS: Brain: No evidence of acute infarction, hemorrhage, hydrocephalus, extra-axial collection or mass lesion/mass effect. There is age related volume loss. There are advanced chronic microvascular ischemic changes bilaterally. Vascular: No hyperdense vessel or unexpected calcification. Skull: Normal. Negative for fracture or focal lesion. Sinuses/Orbits: No acute finding. Postsurgical changes are noted of the left orbit. Other: None. IMPRESSION: 1. No acute intracranial abnormality detected. 2. Chronic microvascular ischemic changes with age related volume loss, progressed from 55. Electronically Signed   By: Constance Holster M.D.   On: 02/09/2019 23:35   Ct Renal Stone Study  Result Date: 02/09/2019 CLINICAL DATA:  Bladder and prostate cancer with increasing left lower quadrant abdominal pain. EXAM: CT ABDOMEN AND PELVIS WITHOUT CONTRAST TECHNIQUE: Multidetector CT imaging of the abdomen and pelvis was performed following the standard protocol without IV contrast. COMPARISON:  None. FINDINGS: Lower chest: There is a 1.7 cm pulmonary nodule in the left lower lobe. This has increased significantly in size from prior study. Heart size is significantly enlarged. There is a trace right-sided pleural effusion. Hepatobiliary: No focal liver abnormality is seen. No gallstones, gallbladder wall thickening, or biliary dilatation. Pancreas: Unremarkable. No pancreatic ductal dilatation or surrounding inflammatory changes. Spleen: Normal in size without focal abnormality. Adrenals/Urinary Tract: There are bilateral double-J ureteral stents in place. There is some mild bilateral collecting system dilatation. The stents appear to terminate in the decompressed urinary bladder. There is a Foley catheter in place. Stomach/Bowel: The stomach is unremarkable. There is a moderate amount of stool in the colon. The appendix is  unremarkable. There is no evidence of a small-bowel obstruction. Vascular/Lymphatic: An IVC filter is in place. There are some prominent but subcentimeter retroperitoneal lymph nodes. There are prominent inguinal and pelvic lymph nodes. Reproductive: There is a large prostate mass currently measuring approximately 7.8 by 8.4 cm (previously measuring 10 x 11 cm. There is a central low attenuation area measuring approximately 21 Hounsfield units. This may represent a necrotic portion of the tumor. Other: No abdominal wall hernia or abnormality. No abdominopelvic ascites. Musculoskeletal: No acute or significant osseous findings. IMPRESSION: 1. Examination limited by lack of IV contrast. 2. Dominant pelvic mass with interval decrease in size consistent with the patient's known prostate cancer. There is a central low-attenuation area which may represent necrosis. 3. Significant interval increase in size of a left lower lobe pulmonary nodule currently measuring approximately 1.6 cm. This is consistent with metastatic disease. 4. Cardiomegaly.  There is a trace right-sided pleural effusion. 5. Moderate amount of stool throughout the colon. 6. Bilateral double-J ureteral stents are in place. There is mild symmetric bilateral collecting system dilatation without frank hydronephrosis. 7. IVC filter in place. Electronically Signed   By: Constance Holster M.D.   On: 02/09/2019 23:43    Procedures Procedures (including critical care time)  Medications Ordered in ED Medications  0.9 %  sodium chloride infusion (has no administration in time range)     Initial Impression / Assessment and Plan / ED Course  I have reviewed the triage vital signs and the nursing notes.  Pertinent labs & imaging results that were available during my care of the patient were reviewed by me and considered in my medical decision making (see chart for details).        Patient's rectal temperature noted and was started on IV  antibiotics for presumptive UTI.  Chest x-ray is negative.  Urinalysis noted here.  Kidney function  is slightly improved at this time but patient remains tachycardic as well as evidence of renal insufficiency.  COVID test negative.  Patient's hemoglobin stable.  Patient will be admitted for observation  Final Clinical Impressions(s) / ED Diagnoses   Final diagnoses:  None    ED Discharge Orders    None       Lacretia Leigh, MD 02/10/19 2025

## 2019-02-10 NOTE — ED Notes (Signed)
Father called to pick pt up for discharge.

## 2019-02-11 ENCOUNTER — Encounter (HOSPITAL_COMMUNITY): Payer: Self-pay | Admitting: *Deleted

## 2019-02-11 ENCOUNTER — Other Ambulatory Visit: Payer: Self-pay

## 2019-02-11 LAB — CBC
HCT: 25.8 % — ABNORMAL LOW (ref 39.0–52.0)
Hemoglobin: 7.7 g/dL — ABNORMAL LOW (ref 13.0–17.0)
MCH: 24.4 pg — ABNORMAL LOW (ref 26.0–34.0)
MCHC: 29.8 g/dL — ABNORMAL LOW (ref 30.0–36.0)
MCV: 81.6 fL (ref 80.0–100.0)
Platelets: 308 10*3/uL (ref 150–400)
RBC: 3.16 MIL/uL — ABNORMAL LOW (ref 4.22–5.81)
RDW: 19.7 % — ABNORMAL HIGH (ref 11.5–15.5)
WBC: 13 10*3/uL — ABNORMAL HIGH (ref 4.0–10.5)
nRBC: 0 % (ref 0.0–0.2)

## 2019-02-11 LAB — HEMOGLOBIN AND HEMATOCRIT, BLOOD
HCT: 26.4 % — ABNORMAL LOW (ref 39.0–52.0)
HCT: 24.3 % — ABNORMAL LOW (ref 39.0–52.0)
Hemoglobin: 7.6 g/dL — ABNORMAL LOW (ref 13.0–17.0)
Hemoglobin: 7.1 g/dL — ABNORMAL LOW (ref 13.0–17.0)

## 2019-02-11 LAB — MAGNESIUM: Magnesium: 2.1 mg/dL (ref 1.7–2.4)

## 2019-02-11 LAB — COMPREHENSIVE METABOLIC PANEL
ALT: 12 U/L (ref 0–44)
AST: 17 U/L (ref 15–41)
Albumin: 2.7 g/dL — ABNORMAL LOW (ref 3.5–5.0)
Alkaline Phosphatase: 65 U/L (ref 38–126)
Anion gap: 11 (ref 5–15)
BUN: 48 mg/dL — ABNORMAL HIGH (ref 6–20)
CO2: 25 mmol/L (ref 22–32)
Calcium: 8.5 mg/dL — ABNORMAL LOW (ref 8.9–10.3)
Chloride: 102 mmol/L (ref 98–111)
Creatinine, Ser: 2.46 mg/dL — ABNORMAL HIGH (ref 0.61–1.24)
GFR calc Af Amer: 34 mL/min — ABNORMAL LOW (ref >60)
GFR calc non Af Amer: 29 mL/min — ABNORMAL LOW (ref >60)
Glucose, Bld: 121 mg/dL — ABNORMAL HIGH (ref 70–99)
Potassium: 3.6 mmol/L (ref 3.5–5.1)
Sodium: 138 mmol/L (ref 135–145)
Total Bilirubin: 0.2 mg/dL — ABNORMAL LOW (ref 0.3–1.2)
Total Protein: 7.8 g/dL (ref 6.5–8.1)

## 2019-02-11 LAB — PROCALCITONIN: Procalcitonin: 0.71 ng/mL

## 2019-02-11 LAB — LACTIC ACID, PLASMA
Lactic Acid, Venous: 0.9 mmol/L (ref 0.5–1.9)
Lactic Acid, Venous: 0.6 mmol/L (ref 0.5–1.9)

## 2019-02-11 LAB — PHOSPHORUS: Phosphorus: 4.8 mg/dL — ABNORMAL HIGH (ref 2.5–4.6)

## 2019-02-11 LAB — BRAIN NATRIURETIC PEPTIDE: B Natriuretic Peptide: 627.8 pg/mL — ABNORMAL HIGH (ref 0.0–100.0)

## 2019-02-11 LAB — PREALBUMIN: Prealbumin: 11.4 mg/dL — ABNORMAL LOW (ref 18–38)

## 2019-02-11 LAB — TSH: TSH: 4.253 u[IU]/mL (ref 0.350–4.500)

## 2019-02-11 MED ORDER — SODIUM CHLORIDE 0.9 % IV SOLN
INTRAVENOUS | Status: DC
  Administered 2019-02-11 – 2019-02-13 (×3): via INTRAVENOUS

## 2019-02-11 MED ORDER — SENNOSIDES-DOCUSATE SODIUM 8.6-50 MG PO TABS: 1.0000 | ORAL_TABLET | Freq: Every day | ORAL | Status: DC | PRN

## 2019-02-11 MED ORDER — ESCITALOPRAM OXALATE 10 MG PO TABS
10.0000 mg | ORAL_TABLET | Freq: Every day | ORAL | Status: DC
  Administered 2019-02-11 – 2019-02-15 (×5): 10 mg via ORAL
  Filled 2019-02-11 (×6): qty 1

## 2019-02-11 MED ORDER — FENTANYL CITRATE (PF) 100 MCG/2ML IJ SOLN
25.0000 ug | INTRAMUSCULAR | Status: DC | PRN
  Administered 2019-02-12 – 2019-02-13 (×2): 25 ug via INTRAVENOUS
  Filled 2019-02-11 (×2): qty 2

## 2019-02-11 MED ORDER — ENSURE ENLIVE PO LIQD
237.0000 mL | Freq: Two times a day (BID) | ORAL | Status: DC
  Administered 2019-02-11 – 2019-02-12 (×2): 237 mL via ORAL

## 2019-02-11 MED ORDER — PNEUMOCOCCAL VAC POLYVALENT 25 MCG/0.5ML IJ INJ
0.5000 mL | INJECTION | INTRAMUSCULAR | Status: AC
  Administered 2019-02-12: 0.5 mL via INTRAMUSCULAR
  Filled 2019-02-11: qty 0.5

## 2019-02-11 MED ORDER — ONDANSETRON HCL 4 MG PO TABS: 4.0000 mg | ORAL_TABLET | Freq: Four times a day (QID) | ORAL | Status: DC | PRN

## 2019-02-11 MED ORDER — ONDANSETRON HCL 4 MG/2ML IJ SOLN: 4.0000 mg | Freq: Four times a day (QID) | INTRAMUSCULAR | Status: DC | PRN

## 2019-02-11 MED ORDER — SODIUM CHLORIDE 0.9 % IV SOLN
1.0000 g | INTRAVENOUS | Status: DC
  Administered 2019-02-11: 1 g via INTRAVENOUS
  Filled 2019-02-11: qty 10

## 2019-02-11 MED ORDER — POLYETHYLENE GLYCOL 3350 17 G PO PACK
17.0000 g | PACK | Freq: Every day | ORAL | Status: DC | PRN
  Administered 2019-02-12: 17 g via ORAL
  Filled 2019-02-11: qty 1

## 2019-02-11 MED ORDER — OXYCODONE HCL 5 MG PO TABS: 5.0000 mg | ORAL_TABLET | ORAL | Status: DC

## 2019-02-11 MED ORDER — SODIUM CHLORIDE 0.9 % IV SOLN
2.0000 g | Freq: Once | INTRAVENOUS | Status: AC
  Administered 2019-02-11: 2 g via INTRAVENOUS
  Filled 2019-02-11: qty 2

## 2019-02-11 MED ORDER — GABAPENTIN 300 MG PO CAPS
300.0000 mg | ORAL_CAPSULE | Freq: Three times a day (TID) | ORAL | Status: DC
  Administered 2019-02-11 – 2019-02-15 (×13): 300 mg via ORAL
  Filled 2019-02-11 (×13): qty 1

## 2019-02-11 MED ORDER — LORAZEPAM 0.5 MG PO TABS
0.5000 mg | ORAL_TABLET | ORAL | Status: DC | PRN
  Administered 2019-02-14 (×2): 0.5 mg via ORAL
  Filled 2019-02-11 (×2): qty 1

## 2019-02-11 MED ORDER — NALOXONE HCL 0.4 MG/ML IJ SOLN: 0.4000 mg | INTRAMUSCULAR | Status: DC | PRN

## 2019-02-11 MED ORDER — SODIUM CHLORIDE 0.9 % IV SOLN
2.0000 g | Freq: Two times a day (BID) | INTRAVENOUS | Status: DC
  Administered 2019-02-11 – 2019-02-13 (×4): 2 g via INTRAVENOUS
  Filled 2019-02-11 (×5): qty 2

## 2019-02-11 MED ORDER — OXYCODONE HCL 5 MG PO TABS
5.0000 mg | ORAL_TABLET | ORAL | Status: DC | PRN
  Administered 2019-02-12: 20 mg via ORAL
  Administered 2019-02-13 – 2019-02-14 (×2): 10 mg via ORAL
  Filled 2019-02-11: qty 4
  Filled 2019-02-11 (×2): qty 2

## 2019-02-11 MED ORDER — SODIUM CHLORIDE 0.9% FLUSH
10.0000 mL | INTRAVENOUS | Status: DC | PRN
  Administered 2019-02-14: 10 mL
  Filled 2019-02-11: qty 40

## 2019-02-11 MED ORDER — ACETAMINOPHEN 650 MG RE SUPP: 650.0000 mg | Freq: Four times a day (QID) | RECTAL | Status: DC | PRN

## 2019-02-11 MED ORDER — BISACODYL 10 MG RE SUPP: 10.0000 mg | Freq: Every day | RECTAL | Status: DC | PRN

## 2019-02-11 MED ORDER — ACETAMINOPHEN 325 MG PO TABS
650.0000 mg | ORAL_TABLET | Freq: Four times a day (QID) | ORAL | Status: DC | PRN
  Administered 2019-02-11 (×2): 650 mg via ORAL
  Filled 2019-02-11 (×2): qty 2

## 2019-02-11 MED ORDER — LEVALBUTEROL HCL 0.63 MG/3ML IN NEBU
0.6300 mg | INHALATION_SOLUTION | Freq: Four times a day (QID) | RESPIRATORY_TRACT | Status: DC | PRN
  Administered 2019-02-13: 0.63 mg via RESPIRATORY_TRACT
  Filled 2019-02-11: qty 3

## 2019-02-11 NOTE — Progress Notes (Signed)
Pharmacy Antibiotic Note  Dylan Gonzalez is a 53 y.o. male admitted on 02/10/2019 with UTI.  Pharmacy has been consulted for cefepime dosing.  Pt has PMH significant for metastatic prostate cancer and is currently on home hospice. Presenting with AMS and UTI. Urine culture obtained PTA growing pseudomonas - sensitivities pending. Patient currently on ceftriaxone, antibiotics being changed to cefepime.  Today, 02/11/19  WBC 13 - elevated  SCr 2.5, CrCl ~38 mL/min  Tmax 102.5 F  Plan:  Discontinue ceftriaxone  Start cefepime 2 g IV q12h  Monitor renal function and culture data  Height: 5\' 11"  (180.3 cm) Weight: 186 lb 11.2 oz (84.7 kg) IBW/kg (Calculated) : 75.3  Temp (24hrs), Avg:100 F (37.8 C), Min:98.6 F (37 C), Max:102.5 F (39.2 C)  Recent Labs  Lab 02/09/19 2053 02/10/19 1749 02/11/19 0704  WBC 12.0* 10.9* 13.0*  CREATININE 2.35* 1.90* 2.46*  LATICACIDVEN  --  1.0  --     Estimated Creatinine Clearance: 37.4 mL/min (A) (by C-G formula based on SCr of 2.46 mg/dL (H)).    Allergies  Allergen Reactions  . Dilaudid [Hydromorphone Hcl] Hives, Itching, Rash and Other (See Comments)    Causes un-controllable shakes  . Pollen Extract Other (See Comments)    Sneezing and shortness of breath    Antimicrobials this admission: cefepime 6/7 >>  ceftriaxone 6/6 >> 6/7  Dose adjustments this admission:  Microbiology results: 6/6 BCx: ngtd 6/6 UCx: Sent 6/5 UCx (PTA): Pseudomonas putida 6/6 COVID: Negative  Thank you for allowing pharmacy to be a part of this patient's care.  Lenis Noon, PharmD 02/11/2019 1:31 PM

## 2019-02-11 NOTE — ED Notes (Signed)
ED TO INPATIENT HANDOFF REPORT  Name/Age/Gender Francis Gaines 53 y.o. male  Code Status    Code Status Orders  (From admission, onward)         Start     Ordered   02/11/19 0649  Full code  Continuous     02/11/19 0648        Code Status History    Date Active Date Inactive Code Status Order ID Comments User Context   09/28/2018 1818 09/30/2018 1732 Full Code [id number]  Mercy Riding, MD ED   08/14/2018 0230 08/18/2018 2031 Full Code [id number]  Rise Patience, MD Inpatient   08/01/2018 2359 08/05/2018 1947 Full Code [id number]  Reubin Milan, MD ED   04/13/2018 1637 04/19/2018 1713 Full Code [id number]  Irine Seal, MD Inpatient      Home/SNF/Other Home  Chief Complaint Penile Hemorrhage  Level of Care/Admitting Diagnosis ED Disposition    ED Disposition Condition [facility]: [facility] [[id number]]  Level of Care: Telemetry [5]  Admit to tele based on following criteria: Monitor for Ischemic changes  Covid Evaluation: Confirmed COVID Negative  Diagnosis: Hematuria [737106]  Admitting Physician: Guilford Shi [[id number]]  Attending Physician: Guilford Shi [[id number]]  Estimated length of stay: past midnight tomorrow  Certification:: I certify this patient will need inpatient services for at least 2 midnights  PT Class (Do Not Modify): Inpatient [101]  PT Acc Code (Do Not Modify): Private [1]       Medical History Past Medical History:  Diagnosis Date  . Bilateral lower extremity edema   . Chronic diastolic congestive heart failure ([place]) 07/2018  . CKD (chronic kidney disease), stage II   . DOE (dyspnea on exertion)   . Foley catheter in place   . Hematuria   . History of CVA (cerebrovascular accident) 2007   per pt no residuals  . History of DVT of lower extremity 06/2018   left common femoral vein,  started on anticoagulant , stopped due to hematuria,  IVC filter placed 08-16-2018  . History of sepsis  09/28/2018   uti  . Hypertension   . Malignant neoplasm metastatic to lung ([place]) 04/19/2018  . Port-A-Cath in place 06/21/2018  . 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 S/P IVC filter 08/16/2018  . Urinary retention 09/28/2018   CHRONIC FOLEY CATH    Allergies Allergies  Allergen Reactions  . Dilaudid [Hydromorphone Hcl] Hives, Itching, Rash and Other (See Comments)    Causes un-controllable shakes  . Pollen Extract Other (See Comments)    Sneezing and shortness of breath    IV Location/Drains/Wounds Patient Lines/Drains/Airways Status   Active Line/Drains/Airways    Name:   Placement date:   Placement time:   Site:   Days:   Implanted Port 06/21/18 Right Chest   06/21/18    1400    Chest   235   Peripheral IV 02/10/19 Right Forearm   02/10/19    1615    Forearm   1   Urethral Catheter dr.wrenn Coude 18 Fr.   10/19/18    1100    Coude   115   Urethral Catheter T Pate Aylward, RN Coude 20 Fr.   02/10/19    1741  Coude   1   Ureteral Drain/Stent Right ureter 6 Fr.   10/19/18    1050    Right ureter   115   Ureteral Drain/Stent Left ureter 6 Fr.   10/19/18    1054    Left ureter   115          Labs/Imaging Results for orders placed or performed during the hospital encounter of 02/10/19 (from the past 48 hour(s))  Urinalysis, Routine w reflex microscopic     Status: Abnormal   Collection Time: 02/10/19  4:55 PM  Result Value Ref Range   Color, Urine RED (A) YELLOW    Comment: BIOCHEMICALS MAY BE AFFECTED BY COLOR   APPearance TURBID (A) CLEAR   Specific Gravity, Urine  1.005 - 1.030    TEST NOT REPORTED DUE TO COLOR INTERFERENCE OF URINE PIGMENT   pH  5.0 - 8.0    TEST NOT REPORTED DUE TO COLOR INTERFERENCE OF URINE PIGMENT   Glucose, UA (A) NEGATIVE mg/dL     TEST NOT REPORTED DUE TO COLOR INTERFERENCE OF URINE PIGMENT   Hgb urine dipstick (A) NEGATIVE    TEST NOT REPORTED DUE TO COLOR INTERFERENCE OF URINE PIGMENT   Bilirubin Urine (A) NEGATIVE    TEST NOT REPORTED DUE TO COLOR INTERFERENCE OF URINE PIGMENT   Ketones, ur (A) NEGATIVE mg/dL    TEST NOT REPORTED DUE TO COLOR INTERFERENCE OF URINE PIGMENT   Protein, ur (A) NEGATIVE mg/dL    TEST NOT REPORTED DUE TO COLOR INTERFERENCE OF URINE PIGMENT   Nitrite (A) NEGATIVE    TEST NOT REPORTED DUE TO COLOR INTERFERENCE OF URINE PIGMENT   Leukocytes,Ua (A) NEGATIVE    TEST NOT REPORTED DUE TO COLOR INTERFERENCE OF URINE PIGMENT    Comment: Performed at [facility], [place] [address], [place], [place] [address]  Urinalysis, Microscopic (reflex)     Status: Abnormal   Collection Time: 02/10/19  4:55 PM  Result Value Ref Range   RBC / HPF >50 0 - 5 RBC/hpf   WBC, UA 0-5 0 - 5 WBC/hpf   Bacteria, UA RARE (A) NONE SEEN   Squamous Epithelial / LPF NONE SEEN 0 - 5   Mucus PRESENT     Comment: Performed at [facility], [place] [address], [place], [place] [address]  CBC with Differential/Platelet     Status: Abnormal   Collection Time: 02/10/19  5:49 PM  Result Value Ref Range   WBC 10.9 (H) 4.0 - 10.5 K/uL   RBC 3.33 (L) 4.22 - 5.81 MIL/uL   Hemoglobin 7.9 (L) 13.0 - 17.0 g/dL   HCT 27.0 (L) 39.0 - 52.0 %   MCV 81.1 80.0 - 100.0 fL   MCH 23.7 (L) 26.0 - 34.0 pg   MCHC 29.3 (L) 30.0 - 36.0 g/dL   RDW 19.3 (H) 11.5 - 15.5 %   Platelets 348 150 - 400 K/uL   nRBC 0.0 0.0 - 0.2 %   Neutrophils Relative % 79 %   Neutro Abs 8.5 (H) 1.7 - 7.7 K/uL   Lymphocytes Relative 8 %   Lymphs Abs 0.9 0.7 - 4.0 K/uL   Monocytes Relative 12 %   Monocytes Absolute 1.3 (H) 0.1 - 1.0 K/uL   Eosinophils Relative 1 %   Eosinophils Absolute 0.1 0.0 - 0.5 K/uL   Basophils Relative 0 %   Basophils Absolute 0.0 0.0 - 0.1 K/uL   Immature Granulocytes 0 %   Abs  Immature  Granulocytes 0.04 0.00 - 0.07 K/uL    Comment: Performed at [facility], [place] [address], [place], [place] [address]  Basic metabolic panel     Status: Abnormal   Collection Time: 02/10/19  5:49 PM  Result Value Ref Range   Sodium 142 135 - 145 mmol/L    Comment: DELTA CHECK NOTED   Potassium 3.8 3.5 - 5.1 mmol/L   Chloride 105 98 - 111 mmol/L   CO2 24 22 - 32 mmol/L   Glucose, Bld 126 (H) 70 - 99 mg/dL   BUN 44 (H) 6 - 20 mg/dL   Creatinine, Ser 1.90 (H) 0.61 - 1.24 mg/dL   Calcium 8.7 (L) 8.9 - 10.3 mg/dL   GFR calc non Af Amer 40 (L) >60 mL/min   GFR calc Af Amer 46 (L) >60 mL/min   Anion gap 13 5 - 15    Comment: Performed at [facility], [place] [address], [place], [place] [address]  SARS Coronavirus 2 (CEPHEID - Performed in [place] hospital lab), Hosp Order     Status: None   Collection Time: 02/10/19  5:49 PM  Result Value Ref Range   SARS Coronavirus 2 NEGATIVE NEGATIVE    Comment: (NOTE) If result is NEGATIVE SARS-CoV-2 target nucleic acids are NOT DETECTED. The SARS-CoV-2 RNA is generally detectable in upper and lower  respiratory specimens during the acute phase of infection. The lowest  concentration of SARS-CoV-2 viral copies this assay can detect is 250  copies / mL. A negative result does not preclude SARS-CoV-2 infection  and should not be used as the sole basis for treatment or other  patient management decisions.  A negative result may occur with  improper specimen collection / handling, submission of specimen other  than nasopharyngeal swab, presence of viral mutation(s) within the  areas targeted by this assay, and inadequate number of viral copies  (<250 copies / mL). A negative result must be combined with clinical  observations, patient history, and epidemiological information. If result is POSITIVE SARS-CoV-2 target nucleic acids are DETECTED. The SARS-CoV-2 RNA is generally detectable in upper and lower   respiratory specimens dur ing the acute phase of infection.  Positive  results are indicative of active infection with SARS-CoV-2.  Clinical  correlation with patient history and other diagnostic information is  necessary to determine patient infection status.  Positive results do  not rule out bacterial infection or co-infection with other viruses. If result is PRESUMPTIVE POSTIVE SARS-CoV-2 nucleic acids MAY BE PRESENT.   A presumptive positive result was obtained on the submitted specimen  and confirmed on repeat testing.  While 2019 novel coronavirus  (SARS-CoV-2) nucleic acids may be present in the submitted sample  additional confirmatory testing may be necessary for epidemiological  and / or clinical management purposes  to differentiate between  SARS-CoV-2 and other Sarbecovirus currently known to infect humans.  If clinically indicated additional testing with an alternate test  methodology [phone]) is advised. The SARS-CoV-2 RNA is generally  detectable in upper and lower respiratory sp ecimens during the acute  phase of infection. The expected result is Negative. Fact Sheet for Patients:  StrictlyIdeas.no Fact Sheet for Healthcare Providers: BankingDealers.co.za This test is not yet approved or cleared by the [place] FDA and has been authorized for detection and/or diagnosis of SARS-CoV-2 by FDA under an Emergency Use Authorization (EUA).  This EUA will remain in effect (meaning this test can be used) for the duration  of the COVID-19 declaration under Section 564(b)(1) of the Act, 21 U.S.C. section 360bbb-3(b)(1), unless the authorization is terminated or revoked sooner. Performed at [facility], [place] [address] [place], [place] [address]   Lactic acid, plasma     Status: None   Collection Time: 02/10/19  5:49 PM  Result Value Ref Range   Lactic Acid, Venous 1.0 0.5 - 1.9 mmol/L    Comment:  Performed at [facility], [place] [address] [place], [place] [address]  Culture, blood (Routine X 2) w Reflex to ID Panel     Status: None (Preliminary result)   Collection Time: 02/10/19  6:07 PM  Result Value Ref Range   Specimen Description      RIGHT ANTECUBITAL Performed at [place] [address] [place], [place] [address]    Special Requests      BOTTLES DRAWN AEROBIC AND ANAEROBIC Blood Culture adequate volume Performed at [place] [address], [place], [place] [address]    Culture      NO GROWTH < 12 HOURS Performed at [place] [address] [place], [place] [address]    Report Status PENDING   Culture, blood (Routine X 2) w Reflex to ID Panel     Status: None (Preliminary result)   Collection Time: 02/10/19  6:08 PM  Result Value Ref Range   Specimen Description      BLOOD RIGHT HAND Performed at [place] [address], [place], [place] [address]    Special Requests      BOTTLES DRAWN AEROBIC AND ANAEROBIC Blood Culture results may not be optimal due to an inadequate volume of blood received in culture bottles Performed at [facility], [place] [address] [place], [place] [address]    Culture      NO GROWTH < 12 HOURS Performed at [place] [address] [place], [place] [address]    Report Status PENDING   Type and screen [place]     Status: None   Collection Time: 02/11/19  7:04 AM  Result Value Ref Range   ABO/RH(D) O POS    Antibody Screen NEG    Sample Expiration      02/14/2019,2359 Performed at [facility], [place] [address], [place], [place] [address]   Magnesium     Status: None   Collection Time: 02/11/19  7:04 AM  Result Value Ref Range   Magnesium 2.1 1.7 - 2.4 mg/dL    Comment: Performed at [facility], [place] [address], [place], [place] [address]   Phosphorus     Status: Abnormal   Collection Time: 02/11/19  7:04 AM  Result Value Ref Range   Phosphorus 4.8 (H) 2.5 - 4.6 mg/dL    Comment: Performed at [facility], [place] [address] [place], [place] [address]  Comprehensive metabolic panel     Status: Abnormal   Collection Time: 02/11/19  7:04 AM  Result Value Ref Range   Sodium 138 135 - 145 mmol/L   Potassium 3.6 3.5 - 5.1 mmol/L   Chloride 102 98 - 111 mmol/L   CO2 25 22 - 32 mmol/L   Glucose, Bld 121 (H) 70 - 99 mg/dL   BUN 48 (H) 6 - 20 mg/dL   Creatinine, Ser 2.46 (H) 0.61 - 1.24 mg/dL   Calcium 8.5 (L) 8.9 - 10.3 mg/dL   Total Protein  7.8 6.5 - 8.1 g/dL   Albumin 2.7 (L) 3.5 - 5.0 g/dL   AST 17 15 - 41 U/L   ALT 12 0 - 44 U/L   Alkaline Phosphatase 65 38 - 126 U/L   Total Bilirubin 0.2 (L) 0.3 - 1.2 mg/dL   GFR calc non Af Amer 29 (L) >60 mL/min   GFR calc Af Amer 34 (L) >60 mL/min   Anion gap 11 5 - 15    Comment: Performed at [facility], [place] [address], [place], [place] [address]  CBC     Status: Abnormal   Collection Time: 02/11/19  7:04 AM  Result Value Ref Range   WBC 13.0 (H) 4.0 - 10.5 K/uL   RBC 3.16 (L) 4.22 - 5.81 MIL/uL   Hemoglobin 7.7 (L) 13.0 - 17.0 g/dL   HCT 25.8 (L) 39.0 - 52.0 %   MCV 81.6 80.0 - 100.0 fL   MCH 24.4 (L) 26.0 - 34.0 pg   MCHC 29.8 (L) 30.0 - 36.0 g/dL   RDW 19.7 (H) 11.5 - 15.5 %   Platelets 308 150 - 400 K/uL   nRBC 0.0 0.0 - 0.2 %    Comment: Performed at [facility], [place] [address], [place],  [address]   Dg Chest 2 View  Result Date: 02/10/2019 CLINICAL DATA:  53 year old male with history of prostate cancer. EXAM: CHEST - 2 VIEW COMPARISON:  Chest x-ray 11/10/2018. FINDINGS: Right internal jugular single-lumen power porta cath with tip terminating at the superior cavoatrial junction. Lung volumes are low. No consolidative airspace disease. No pleural effusions. No pneumothorax. No definite suspicious appearing  pulmonary nodules or masses. Pulmonary venous congestion, without frank pulmonary edema. Moderate cardiomegaly. Upper mediastinal contours are within normal limits. Aortic atherosclerosis. IMPRESSION: 1. Low lung volumes with cardiomegaly and pulmonary venous congestion, but no frank pulmonary edema. 2. Aortic atherosclerosis. Electronically Signed   By: Vinnie Langton M.D.   On: 02/10/2019 20:13   Ct Head Wo Contrast  Result Date: 02/09/2019 CLINICAL DATA:  Weakness EXAM: CT HEAD WITHOUT CONTRAST TECHNIQUE: Contiguous axial images were obtained from the base of the skull through the vertex without intravenous contrast. COMPARISON:  CT head dated 02/02/2014. FINDINGS: Brain: No evidence of acute infarction, hemorrhage, hydrocephalus, extra-axial collection or mass lesion/mass effect. There is age related volume loss. There are advanced chronic microvascular ischemic changes bilaterally. Vascular: No hyperdense vessel or unexpected calcification. Skull: Normal. Negative for fracture or focal lesion. Sinuses/Orbits: No acute finding. Postsurgical changes are noted of the left orbit. Other: None. IMPRESSION: 1. No acute intracranial abnormality detected. 2. Chronic microvascular ischemic changes with age related volume loss, progressed from 67. Electronically Signed   By: Constance Holster M.D.   On: 02/09/2019 23:35   Ct Renal Stone Study  Result Date: 02/09/2019 CLINICAL DATA:  Bladder and prostate cancer with increasing left lower quadrant abdominal pain. EXAM: CT ABDOMEN AND PELVIS WITHOUT CONTRAST TECHNIQUE: Multidetector CT imaging of the abdomen and pelvis was performed following the standard protocol without IV contrast. COMPARISON:  None. FINDINGS: Lower chest: There is a 1.7 cm pulmonary nodule in the left lower lobe. This has increased significantly in size from prior study. Heart size is significantly enlarged. There is a trace right-sided pleural effusion. Hepatobiliary: No focal liver  abnormality is seen. No gallstones, gallbladder wall thickening, or biliary dilatation. Pancreas: Unremarkable. No pancreatic ductal dilatation or surrounding inflammatory changes. Spleen: Normal in size without focal abnormality. Adrenals/Urinary Tract: There are bilateral  double-J ureteral stents in place. There is some mild bilateral collecting system dilatation. The stents appear to terminate in the decompressed urinary bladder. There is a Foley catheter in place. Stomach/Bowel: The stomach is unremarkable. There is a moderate amount of stool in the colon. The appendix is unremarkable. There is no evidence of a small-bowel obstruction. Vascular/Lymphatic: An IVC filter is in place. There are some prominent but subcentimeter retroperitoneal lymph nodes. There are prominent inguinal and pelvic lymph nodes. Reproductive: There is a large prostate mass currently measuring approximately 7.8 by 8.4 cm (previously measuring 10 x 11 cm. There is a central low attenuation area measuring approximately 21 Hounsfield units. This may represent a necrotic portion of the tumor. Other: No abdominal wall hernia or abnormality. No abdominopelvic ascites. Musculoskeletal: No acute or significant osseous findings. IMPRESSION: 1. Examination limited by lack of IV contrast. 2. Dominant pelvic mass with interval decrease in size consistent with the patient's known prostate cancer. There is a central low-attenuation area which may represent necrosis. 3. Significant interval increase in size of a left lower lobe pulmonary nodule currently measuring approximately 1.6 cm. This is consistent with metastatic disease. 4. Cardiomegaly.  There is a trace right-sided pleural effusion. 5. Moderate amount of stool throughout the colon. 6. Bilateral double-J ureteral stents are in place. There is mild symmetric bilateral collecting system dilatation without frank hydronephrosis. 7. IVC filter in place. Electronically Signed   By: Constance Holster M.D.   On: 02/09/2019 23:43    Pending Labs Unresulted Labs (From admission, onward)    Start     Ordered   02/11/19 1058  Hemoglobin and hematocrit, blood  Now then every 6 hours,   R     02/11/19 1057   02/11/19 0649  TSH  Once,   R    Comments:  Cancel if already done within 1 month and notify MD    02/11/19 0648   02/10/19 1655  Urine Culture  ONCE - STAT,   STAT     02/10/19 1654   Signed and Held  Brain natriuretic peptide  Once,   R     Signed and Held   Signed and Held  Prealbumin  Tomorrow morning,   R     Signed and Held   Signed and Held  Lactic acid, plasma  STAT Now then every 3 hours,   STAT     Signed and Held   Signed and Held  Procalcitonin  Add-on,   R     Signed and Held          Vitals/Pain Today's Vitals   02/11/19 0730 02/11/19 0800 02/11/19 0830 02/11/19 1120  BP: (!) 148/87 (!) 159/105 (!) 165/105 (!) 159/110  Pulse: 94 (!) 111 (!) 108 (!) 106  Resp: 20 16 (!) 24 (!) 25  Temp:    99 F (37.2 C)  TempSrc:    Oral  SpO2: 94% 94% 94% 95%  PainSc:    4     Isolation Precautions No active isolations  Medications Medications  0.9 %  sodium chloride infusion (has no administration in time range)  morphine (MS CONTIN) 12 hr tablet 30 mg (30 mg Oral Given 02/11/19 0030)  oxyCODONE (Oxy IR/ROXICODONE) immediate release tablet 5-20 mg (has no administration in time range)  metoprolol tartrate (LOPRESSOR) tablet 25 mg (25 mg Oral Given 02/11/19 1123)  escitalopram (LEXAPRO) tablet 10 mg (10 mg Oral Given 02/11/19 1123)  LORazepam (ATIVAN) tablet 0.5 mg (has no administration  in time range)  polyethylene glycol (MIRALAX / GLYCOLAX) packet 17 g (has no administration in time range)  senna-docusate (Senokot-S) tablet 1 tablet (has no administration in time range)  gabapentin (NEURONTIN) capsule 300 mg (300 mg Oral Given 02/11/19 1123)  acetaminophen (TYLENOL) tablet 650 mg (650 mg Oral Given 02/11/19 0028)    Or  acetaminophen (TYLENOL) suppository 650 mg  ( Rectal See Alternative 02/11/19 0028)  ondansetron (ZOFRAN) tablet 4 mg (has no administration in time range)    Or  ondansetron (ZOFRAN) injection 4 mg (has no administration in time range)  bisacodyl (DULCOLAX) suppository 10 mg (has no administration in time range)  levalbuterol (XOPENEX) nebulizer solution 0.63 mg (has no administration in time range)  opium-belladonna (B&O SUPPRETTES) 16.2-60 MG suppository 1 suppository (1 suppository Rectal Given 02/10/19 2310)  mirabegron ER (MYRBETRIQ) tablet 25 mg (has no administration in time range)  cefTRIAXone (ROCEPHIN) 1 g in sodium chloride 0.9 % 100 mL IVPB (1 g Intravenous New Bag/Given 02/11/19 1122)  fentaNYL (SUBLIMAZE) injection 25 mcg (has no administration in time range)  naloxone (NARCAN) injection 0.4 mg (has no administration in time range)    Mobility walks with device

## 2019-02-11 NOTE — Evaluation (Signed)
Occupational Therapy Evaluation Patient Details Name: Dylan Gonzalez MRN: [id number] DOB: Dec 13, 1965 Today's Date: 02/11/2019    History of Present Illness Pt is a 53 y.o. 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 He is currently receiving hospice services in the home. He presented to ED 6/5 with pubic abdominal pain. Found to have AKI and UTI treated with IV antibiotics patient chose to dc and he came back (6/6) because of bleeding around the Foley site, low grade fevers, bladder pressure and discomfort.   Clinical Impression   PTA Pt needs assist for ADL/IADL - he has an aide that comes every wed for weekly bath. He does daily dressing and cooking on his own typically. He uses a SPC PRN. Today he is mod A for LB ADL due to pain in abdomen and at foley site. Min A for transfers and mobility - assist for balance. Will require skilled OT in the acute setting and anticipate he will not need follow-up therapy. OT will continue to follow acutely with energy conservation focus of next session (take handout).     Follow Up Recommendations  No OT follow up;Supervision - Intermittent    Equipment Recommendations  None recommended by OT(Pt has appropriate DME)    Recommendations for Other Services       Precautions / Restrictions Precautions Precautions: Fall Precaution Comments: Pt with severe bladder spasms in sitting      Mobility Bed Mobility Overal bed mobility: Needs Assistance Bed Mobility: Supine to Sit;Sit to Supine     Supine to sit: Min guard Sit to supine: Min assist   General bed mobility comments: increased time  Transfers Overall transfer level: Needs assistance   Transfers: Sit to/from Stand Sit to Stand: Min assist;Min guard         General transfer comment: steady assist  for balance 2* painful bladder spasms     Balance Overall balance assessment: Needs assistance Sitting-balance support: No upper extremity supported;Feet supported Sitting balance-Leahy Scale: Good     Standing balance support: No upper extremity supported Standing balance-Leahy Scale: Fair                             ADL either performed or assessed with clinical judgement   ADL Overall ADL's : Needs assistance/impaired Eating/Feeding: Modified independent;Sitting   Grooming: Min guard;Standing   Upper Body Bathing: Set up;Sitting   Lower Body Bathing: Moderate assistance;Sitting/lateral leans   Upper Body Dressing : Set up;Sitting   Lower Body Dressing: Moderate assistance;Sit to/from stand   Toilet Transfer: Minimal assistance;Ambulation   Toileting- Clothing Manipulation and Hygiene: Moderate assistance       Functional mobility during ADLs: Minimal assistance General ADL Comments: decreased access to LB for ADL due to pain in abdomen and from foley     Vision Patient Visual Report: No change from baseline       Perception     Praxis      Pertinent Vitals/Pain Pain Assessment: Faces Faces Pain Scale: Hurts whole lot Pain Location: bladder spasms in sitting Pain Descriptors / Indicators: Spasm Pain Intervention(s): Limited activity within patient's tolerance;Monitored during session;Repositioned;Patient requesting pain meds-RN notified     Hand Dominance     Extremity/Trunk Assessment Upper Extremity Assessment Upper Extremity Assessment: Generalized weakness   Lower Extremity Assessment Lower Extremity Assessment: Generalized weakness  Cervical / Trunk Assessment Cervical / Trunk Assessment: Normal   Communication Communication Communication: No difficulties   Cognition Arousal/Alertness: Awake/alert Behavior During Therapy: WFL for tasks assessed/performed Overall Cognitive Status: Within Functional Limits for tasks assessed                                      General Comments  spoke with RN while mobilizing to ensure that morning medications (including pain) were administered    Exercises     Shoulder Instructions      Home Living Family/patient expects to be discharged to:: Private residence Living Arrangements: Alone(but wife continues to assist) Available Help at Discharge: Family Type of Home: House Home Access: Stairs to enter Technical brewer of Steps: 2 Entrance Stairs-Rails: Right Home Layout: One level     Bathroom Shower/Tub: Teacher, early years/pre: [place] - single point;Walker - 4 wheels;Tub bench   Additional Comments: Pt has aide once a week to assist with showering      Prior Functioning/Environment Level of Independence: Needs assistance  Gait / Transfers Assistance Needed: Pt states uses cane as needed at home and also has RW ADL's / Homemaking Assistance Needed: Pt has aide that comes every wed for weekly bath            OT Problem List: Decreased activity tolerance;Impaired balance (sitting and/or standing);Decreased knowledge of use of DME or AE;Increased edema;Pain      OT Treatment/Interventions: Self-care/ADL training;Energy conservation;Therapeutic activities;Patient/family education;Balance training    OT Goals(Current goals can be found in the care plan section) Acute Rehab OT Goals Patient Stated Goal: decreased pain OT Goal Formulation: With patient Time For Goal Achievement: 02/25/19 Potential to Achieve Goals: Good ADL Goals Pt Will Perform Grooming: with modified independence;standing Pt Will Perform Lower Body Bathing: with modified independence;sit to/from stand Pt Will Perform Lower Body Dressing: with modified independence;sit to/from stand Pt Will Transfer to Toilet: with modified independence;ambulating Pt Will Perform Toileting - Clothing Manipulation and hygiene: with modified  independence;sit to/from stand Additional ADL Goal #1: Pt will recall 3 ways of conserving energy during ADL/IADL at independent level  OT Frequency: Min 2X/week   Barriers to D/C:            Co-evaluation PT/OT/SLP Co-Evaluation/Treatment: Yes Reason for Co-Treatment: For patient/therapist safety PT goals addressed during session: Mobility/safety with mobility OT goals addressed during session: ADL's and self-care      AM-PAC OT "6 Clicks" Daily Activity     Outcome Measure Help from another person eating meals?: None Help from another person taking care of personal grooming?: A Little Help from another person toileting, which includes using toliet, bedpan, or urinal?: A Little Help from another person bathing (including washing, rinsing, drying)?: A Lot Help from another person to put on and taking off regular upper body clothing?: None Help from another person to put on and taking off regular lower body clothing?: A Lot 6 Click Score: 18   End of Session Equipment Utilized During Treatment: Gait belt;Rolling walker Nurse Communication: Mobility status;Patient requests pain meds  Activity Tolerance: Patient tolerated treatment well Patient left: in bed;with call bell/phone within reach  OT Visit Diagnosis: Unsteadiness on feet (R26.81);Muscle weakness (generalized) (M62.81);Pain Pain - part of body: (abdomen and foley site)                Time: 1517-6160 OT  Time Calculation (min): 23 min Charges:  OT General Charges $OT Visit: 1 Visit OT Evaluation $OT Eval Low Complexity: [place] OTR/L Acute Rehabilitation Services Pager: [phone] Office: [place] 02/11/2019, 1:32 PM

## 2019-02-11 NOTE — ED Notes (Signed)
Rectal temp of 102.5, MD notified.

## 2019-02-11 NOTE — Evaluation (Signed)
Physical Therapy Evaluation Patient Details Name: Dylan Gonzalez MRN: [id number] DOB: [birth year] Today's Date: 02/11/2019   History of Present Illness  Pt is a 53 y.o. 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 He is currently receiving hospice services in the home. He presented to ED 6/5 with pubic abdominal pain. Found to have AKI and UTI treated with IV antibiotics patient chose to dc and he came back (6/6) because of bleeding around the Foley site, low grade fevers, bladder pressure and discomfort.  Clinical Impression  Pt admitted as above and presenting with functional mobility limitations 2* generalized weakness, mild ambulatory balance deficits, limited endurance and pain related to ongoing bladder spasms.  Pt very motivated and should progress to dc home with intermittent assist of spouse (does not live with spouse but continues to assist).    Follow Up Recommendations No PT follow up    Equipment Recommendations  None recommended by PT    Recommendations for Other Services       Precautions / Restrictions Precautions Precautions: Fall Precaution Comments: Pt with severe bladder spasms in sitting      Mobility  Bed Mobility Overal bed mobility: Needs Assistance Bed Mobility: Supine to Sit;Sit to Supine     Supine to sit: Min guard Sit to supine: Min assist   General bed mobility comments: increased time  Transfers Overall transfer level: Needs assistance   Transfers: Sit to/from Stand Sit to Stand: Min assist;Min guard         General transfer comment: steady assist for balance 2* painful bladder spasms   Ambulation/Gait Ambulation/Gait assistance: Min guard Gait Distance (Feet): 180 Feet Assistive device: 1 person hand held assist Gait Pattern/deviations: Wide base  of support;Decreased step length - right;Decreased step length - left;Shuffle Gait velocity: decr   General Gait Details: slow pace, increased BOS, mild general instability but no LOB  Stairs            Wheelchair Mobility    Modified Rankin (Stroke Patients Only)       Balance Overall balance assessment: Needs assistance Sitting-balance support: No upper extremity supported;Feet supported Sitting balance-Leahy Scale: Good     Standing balance support: No upper extremity supported Standing balance-Leahy Scale: Fair                               Pertinent Vitals/Pain Pain Assessment: Faces Faces Pain Scale: Hurts whole lot Pain Location: bladder spasms in sitting Pain Descriptors / Indicators: Spasm Pain Intervention(s): Limited activity within patient's tolerance;Monitored during session;Patient requesting pain meds-RN notified    Home Living Family/patient expects to be discharged to:: Private residence Living Arrangements: Alone(but wife continues to assist) Available Help at Discharge: Family Type of Home: House Home Access: Stairs to enter Entrance Stairs-Rails: Right Entrance Stairs-Number of Steps: 2 Home Layout: One level Home Equipment: [place] - single point;Walker - 4 wheels Additional Comments: Pt has aide once a week to assist with showering    Prior Function Level of Independence: Needs assistance   Gait / Transfers Assistance Needed: Pt states uses cane as needed at home and also has RW           Hand Dominance        Extremity/Trunk Assessment   Upper Extremity Assessment Upper Extremity Assessment: Generalized weakness  Lower Extremity Assessment Lower Extremity Assessment: Generalized weakness       Communication   Communication: No difficulties  Cognition Arousal/Alertness: Awake/alert Behavior During Therapy: WFL for tasks assessed/performed Overall Cognitive Status: Within Functional Limits for tasks  assessed                                        General Comments      Exercises     Assessment/Plan    PT Assessment Patient needs continued PT services  PT Problem List Decreased strength;Decreased activity tolerance;Decreased balance;Decreased mobility;Decreased knowledge of use of DME;Pain       PT Treatment Interventions DME instruction;Gait training;Stair training;Functional mobility training;Therapeutic activities;Therapeutic exercise;Patient/family education    PT Goals (Current goals can be found in the Care Plan section)  Acute Rehab PT Goals Patient Stated Goal: decreased pain PT Goal Formulation: With patient Time For Goal Achievement: 02/25/19 Potential to Achieve Goals: Good    Frequency Min 3X/week   Barriers to discharge        Co-evaluation PT/OT/SLP Co-Evaluation/Treatment: Yes Reason for Co-Treatment: For patient/therapist safety PT goals addressed during session: Mobility/safety with mobility OT goals addressed during session: ADL's and self-care       AM-PAC PT "6 Clicks" Mobility  Outcome Measure Help needed turning from your back to your side while in a flat bed without using bedrails?: A Little Help needed moving from lying on your back to sitting on the side of a flat bed without using bedrails?: A Little Help needed moving to and from a bed to a chair (including a wheelchair)?: A Little Help needed standing up from a chair using your arms (e.g., wheelchair or bedside chair)?: A Little Help needed to walk in hospital room?: A Little Help needed climbing 3-5 steps with a railing? : A Little 6 Click Score: 18    End of Session Equipment Utilized During Treatment: Gait belt Activity Tolerance: Patient tolerated treatment well Patient left: in bed;with call bell/phone within reach Nurse Communication: Mobility status PT Visit Diagnosis: Difficulty in walking, not elsewhere classified (R26.2);Muscle weakness (generalized)  (M62.81)    Time: 3235-5732 PT Time Calculation (min) (ACUTE ONLY): 25 min   Charges:   PT Evaluation $PT Eval Low Complexity: 1 Low          [place] Pager [phone] Office [phone]   Jari Dipasquale 02/11/2019, 1:07 PM

## 2019-02-11 NOTE — Progress Notes (Signed)
[facility]) Hospital Liaison: RN  This is a related and covered GIP admission on  02/11/2019 with [facility]) diagnosis of prostate cancer per Dr. Eulas Post. Patient is a full code. Pt recently had a ED visit on 6/5 for dehydration/UTI and d/c home on ABT on 6/6. Pt informed staff he needed nasal cannulas however upon arrival the visiting SN the spouse indicated pt was bleeding clots on the floor from his foley cath. SN further investigated and confirmed pt was bleeding. EMS was activated and pt transported to [facility] ED.  Pt was admitted to the hospital for hematuria.  ACC GIP  Pt admitted today with bleeding around the Foley site and low-grade fevers with worsening bladder pressure/spasms. Noted indicate pt Dylan Gonzalez made aware 10 days ago gave him some sample for medication to try to stop the bleeding it helped but now seemed to recur. Patient complaining of intermittent severe bladder spasms.  Seen with urology bedside.  Bladder scan indicates residual urine found. Pt was type and screen.  Liaison spoke with bedside nurse Dylan Gonzalez) who indicates pt only with pain with the intermittent bladder spasm and pt decline oral pain medication  On the floor indicating he received in ED but receptive to B&O suppository with good relief. States pt is more relaxed and attempting to fall asleep at this time. States pt continue to have some blood leakage at the site with the ongoing spasms. Pt now on IV anbiotics (Rocephin and Maxipime).  Note pt lives alone and his spouse who does not live with pt visits every 2 days to assist pt alone with his father for appointments and errands.   V/S: 99.3 Temp, 146/94 B/P, 104 HR, 24 Resp and 96% R/A IV access/Input: Right forearm IV NS @ 75cc/hr Output:  Output 900 ml urine and bladder scan 6/6 375 ml Labs: 6/7-Glucose 121, BUN 48, Creatinine 2.46, Ca 8.5, Phos 4.8, Albumin 2.7, Total Bil 0.2, WBC 13, RBC 3.16 and the latest H/H on 6/7 @1313  was Hem 7.6  and HCT 26.4. Note MCH 24.4, MCHC 29.8 and RDW 19.7. SARS COV-2 negative on 02/10/2019. Medications: Rocephin 1 g 6/7, Maxipime 2 g to start today scheduled at 1400 and 2300. No PRN reported and pt has received the B&O sup earlier today reported by the bedside nurse.   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  Patient was seen in the ED yesterday for the same and noted to have UTI, received 1 dose of IV antibiotics and went home on Bactrim/Keflex.  He returns today with bleeding around the Foley site and low-grade fevers with worsening bladder pressure/spasms.Reports he has Dylan Gonzalez 10 days ago gave him some sample for medication to try to stop the bleeding it helped but now seemed to recurrent Subjective: Patient complaining of intermittent severe bladder spasms.  Seen with urology bedside.  He does have gross hematuria in the Foley catheter (changed overnight).  He denies any chest pain or shortness of breath.  States he lives alone.  He is separated from his wife but she visits him every 2 days to help out.   Assessment & Plan: 1.  Gross hematuria/suprapubic pain with bladder spasms: Secondary to UTI versus prostrate cancer.  Appreciate urology  evaluation and input.  CBI deferred for now as patient would require a larger bore Foley catheter and he may not tolerate.  Will monitor H&H and transfuse as needed.  Continue antispasmodic/pain medications and antibiotics. 2. UTI/sepsis syndrome: Present on admission with tachycardia/fever.  Continue antibiotics and IV fluids.  Will follow-up urine and blood cultures. 3.  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  Patient is status post TURP 04/2018 and status post 6 cycles of chemotherapy.  He is currently on home hospice, follows Dr. Alen Blew and has poor prognosis.  CODE STATUS discussed with patient by me and by urology this morning.  Although he stated he understood the implications of full code status while on hospice, he would like some time to think about it and he remains full code at this time.  Palliative care team to follow-up further and advise.  Patient would like his wife to make primary decisions although they have been separated.  Alternatively his parents have been involved in his care as well. 4. Acute on CKD Stage II : Likely secondary to obstructive uropathy from prostate cancer/hematuria.  Baseline creatinine appears to be around 1.5 over the last year.  His creatinine was elevated at 2.35 on June 5 and now at 2.4.  Cautious IV fluids given history of diastolic CHF.  5.  Hypertension/CAD/CVA: Not a candidate for antiplatelet agents in the setting of gross hematuria.  Resume metoprolol given elevated blood pressure. 6.  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  Will have IV fentanyl available for breakthrough pain.He is also on Neurontin 300 mg 3 times daily 7.  Chronic diastolic CHF: Compensated.  Watch on IV fluids  DVT prophylaxis: Status post IVC filter.  Not a candidate for anticoagulants Code Status: Full code for now Family / Patient Communication: Discussed with patient in detail. Disposition Plan: Will downgrade to telemetry status as no beds and stepdown unit and patient hemodynamically stable.  He is also a hospice patient with poor prognosis and does not wish any aggressive interventions.  He is to discuss with family members regarding CODE STATUS and follow-up with palliative care  Will  upgrade to inpatient status with anticipated length of stay greater than 2 midnights given ongoing hematuria, need for IV fluids in the setting of acute on chronic kidney disease, need for IV antibiotics in the setting of UTI/sepsis and IV pain medications.  ACC Transfer Summary and Medication List faxed to Miquel Dunn Control and instrumentation engineer [address] [place]) who will place on pt's shallow chart.    Plan of care was discussed with [facility] Liaison and CSW Dylan Gonzalez). Liaison also spoke with PCG spouse (Dylan Gonzalez) with update.   Goals of Care: Symptom management. Pt is a FULL code.  Communication with IDG: Team updated of patient disposition.  ACC will continue to follow while in-patient. Plan would be for pt to return home with Hospice upon discharge.  Please call with any hospice related questions/concerns. Please use [facility] for ambulance transport if needed at time of discharge.  Thank You.   Ellie Lunch [facility] Liaison [phone]  [place] are listed on AMION under [facility].

## 2019-02-11 NOTE — Progress Notes (Signed)
[facility]) Hospital Liaison:  RN [id number]  Pt is currently a [facility]) patient who arrived to the ED on 02/10/2019. RN attempted to contact the bedside nurse to receive report however not available due to pt care. ACC will continue to follow up accordingly.   Please call with hospice related questions.  Raina Mina, RN, BSN [facility] Liaison  [phone]  [place] are listed daily on AMION under Hospice and [place].

## 2019-02-11 NOTE — ED Notes (Signed)
Pt wife called this am. Was given an update on the pt.

## 2019-02-11 NOTE — Consult Note (Signed)
Urology Consult Note   Requesting Attending Physician:  Guilford Shi, MD Service Providing Consult: Urology  Consulting Attending: Phebe Colla, MD   Reason for Consult: Bladder spasms, gross hematuria  HPI: Dylan Gonzalez is seen in consultation for reasons noted above at the request of Guilford Shi, MD for evaluation of bladder spasms and gross hematuria.  This is a 53 y.o. male with metastatic prostate cancer who is currently on home hospice who presented with altered mental status and concern for urinary tract infection. Patient remains a full code and family desires all possible interventions currently and he presented to the ED. He presented with fever and leakage around foley catheter. In the ED, he was noted to have a slight leukocytosis of 10.9 (13 today) and AKI with creatinine 2.35 (2.46 today). Hgb 7.9 (7.7 today).   He reports that bladder spasms are usually adequately controlled at baseline on myrbetriq. However, over the past few days, he has experienced significantly worsened bladder spasms. He also notes gross hematuria, which has been a chronic intermittent issue. He reports hematuria is at baseline currently.    Past Medical History: Past Medical History:  Diagnosis Date  . Bilateral lower extremity edema   . Chronic diastolic congestive heart failure ([place]) 07/2018  . CKD (chronic kidney disease), stage II   . DOE (dyspnea on exertion)   . Foley catheter in place   . Hematuria   . History of CVA (cerebrovascular accident) 2007   per pt no residuals  . History of DVT of lower extremity 06/2018   left common femoral vein,  started on anticoagulant , stopped due to hematuria,  IVC filter placed 08-16-2018  . History of sepsis 09/28/2018   uti  . Hypertension   . Malignant neoplasm metastatic to lung ([place]) 04/19/2018  . Port-A-Cath in place 06/21/2018  . 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 S/P IVC filter 08/16/2018  . Urinary retention 09/28/2018   CHRONIC FOLEY CATH    Past Surgical History:  Past Surgical History:  Procedure Laterality Date  . CYSTOSCOPY W/ URETERAL STENT PLACEMENT Bilateral 04/13/2018   Procedure: CYSTOSCOPY WITH BILATERAL STENT REPLACEMENT;  Surgeon: Irine Seal, MD;  Location: [facility];  Service: Urology;  Laterality: Bilateral;  . CYSTOSCOPY WITH STENT PLACEMENT Bilateral 10/19/2018   Procedure: CYSTOSCOPY WITH STENT EXCHANGE Bilateral;  Surgeon: Irine Seal, MD;  Location: [facility];  Service: Urology;  Laterality: Bilateral;  . ENUCLEATION Left 1976   removal left eye due to Retinoplastoma  . INTRAOCULAR PROSTHESES INSERTION    . IR IMAGING GUIDED PORT INSERTION  06/21/2018  . IR IVC FILTER PLMT / S&I /IMG GUID/MOD SED  08/16/2018  . LAPAROSCOPIC INGUINAL HERNIA REPAIR Right 11-19-2010   dr d. Ninfa Linden  @[facility]    AND UMBILICAL HERNIA REPAIR  . PROSTATE BIOPSY [name] 04/13/2018   Procedure: ULTRASOUND GUIDED PROSTATE BIOPSY;  Surgeon: Irine Seal, MD;  Location: [facility];  Service: Urology;  Laterality: [name];  . TOOTH EXTRACTION Right 04/21/2013   Procedure: EXTRACTION MOLARS;  Surgeon: [name], DDS;  Location: [place];  Service: Oral Surgery;  Laterality: Right;  . TRANSURETHRAL RESECTION OF PROSTATE  04/13/2018   Procedure: TRANSURETHRAL RESECTION OF THE PROSTATE (TURP);  Surgeon: Irine Seal, MD;  Location: [facility];  Service: Urology;;    Medication: Current Facility-Administered Medications  Medication Dose Route Frequency Provider Last Rate Last Dose  . 0.9 %  sodium chloride infusion   Intravenous Continuous Doutova, Anastassia, MD      . acetaminophen (TYLENOL) tablet 650 mg  650 mg Oral Q6H PRN Toy Baker, MD   650 mg at 02/11/19 0028   Or  . acetaminophen (TYLENOL) suppository 650 mg  650  mg Rectal Q6H PRN Doutova, Anastassia, MD      . bisacodyl (DULCOLAX) suppository 10 mg  10 mg Rectal Daily PRN Doutova, Anastassia, MD      . cefTRIAXone (ROCEPHIN) 1 g in sodium chloride 0.9 % 100 mL IVPB  1 g Intravenous Q24H Guilford Shi, MD 200 mL/hr at 02/11/19 1122 1 g at 02/11/19 1122  . escitalopram (LEXAPRO) tablet 10 mg  10 mg Oral Daily Doutova, Anastassia, MD   10 mg at 02/11/19 1123  . fentaNYL (SUBLIMAZE) injection 25 mcg  25 mcg Intravenous Q4H PRN Guilford Shi, MD      . gabapentin (NEURONTIN) capsule 300 mg  300 mg Oral TID Toy Baker, MD   300 mg at 02/11/19 1123  . levalbuterol (XOPENEX) nebulizer solution 0.63 mg  0.63 mg Nebulization Q6H PRN Doutova, Anastassia, MD      . LORazepam (ATIVAN) tablet 0.5 mg  0.5 mg Oral Q4H PRN Doutova, Anastassia, MD      . metoprolol tartrate (LOPRESSOR) tablet 25 mg  25 mg Oral BID Toy Baker, MD   25 mg at 02/11/19 1123  . mirabegron ER (MYRBETRIQ) tablet 25 mg  25 mg Oral Daily Doutova, Anastassia, MD      . morphine (MS CONTIN) 12 hr tablet 30 mg  30 mg Oral Q8H Doutova, Anastassia, MD   30 mg at 02/11/19 0030  . naloxone (NARCAN) injection 0.4 mg  0.4 mg Intravenous PRN Guilford Shi, MD      . ondansetron (ZOFRAN) tablet 4 mg  4 mg Oral Q6H PRN Doutova, Anastassia, MD       Or  . ondansetron (ZOFRAN) injection 4 mg  4 mg Intravenous Q6H PRN Doutova, Anastassia, MD      . opium-belladonna (B&O SUPPRETTES) 16.2-60 MG suppository 1 suppository  1 suppository Rectal Q8H PRN Toy Baker, MD   1 suppository at 02/10/19 2310  . oxyCODONE (Oxy IR/ROXICODONE) immediate release tablet 5-20 mg  5-20 mg Oral Q4H PRN Kamineni, Neelima, MD      . polyethylene glycol (MIRALAX / GLYCOLAX) packet 17 g  17 g Oral Daily PRN Doutova, Anastassia, MD      . senna-docusate (Senokot-S) tablet 1 tablet  1 tablet Oral Daily PRN Toy Baker, MD        Allergies: Allergies  Allergen Reactions  . Dilaudid  [Hydromorphone Hcl] Hives, Itching, Rash and Other (See Comments)    Causes un-controllable shakes  . Pollen Extract Other (See Comments)    Sneezing and shortness of breath    Social History: Social History   Tobacco Use  . Smoking status: Never Smoker  . Smokeless tobacco: Never Used  Substance Use Topics  . Alcohol use: No  . Drug use: Never    Family History Family History  Problem Relation Age of Onset  . Hypertension Father   . Depression Brother     Review of Systems 10 systems were reviewed and are negative except as noted specifically in the HPI.  Objective   Vital signs in last 24 hours: BP (!) 159/110 (BP  Location: Left Arm)   Pulse (!) 106   Temp 99 F (37.2 C) (Oral)   Resp (!) 25   SpO2 95%   Physical Exam General: NAD, A&O, resting, appropriate HEENT: [place]/AT, EOMI, MMM Pulmonary: Normal work of breathing Cardiovascular: HDS, adequate peripheral perfusion Abdomen: Soft, NTTP. GU: foley draining thin merlot colored urine Extremities: warm and well perfused Neuro: Appropriate, no focal neurological deficits  Most Recent Labs: Lab Results  Component Value Date   WBC 13.0 (H) 02/11/2019   HGB 7.7 (L) 02/11/2019   HCT 25.8 (L) 02/11/2019   PLT 308 02/11/2019    Lab Results  Component Value Date   NA 138 02/11/2019   K 3.6 02/11/2019   CL 102 02/11/2019   CO2 25 02/11/2019   BUN 48 (H) 02/11/2019   CREATININE 2.46 (H) 02/11/2019   CALCIUM 8.5 (L) 02/11/2019   MG 2.1 02/11/2019   PHOS 4.8 (H) 02/11/2019    Lab Results  Component Value Date   INR 1.1 11/10/2018   APTT 55 (H) 06/27/2018     IMAGING: Dg Chest 2 View  Result Date: 02/10/2019 CLINICAL DATA:  53 year old male with history of prostate cancer. EXAM: CHEST - 2 VIEW COMPARISON:  Chest x-ray 11/10/2018. FINDINGS: Right internal jugular single-lumen power porta cath with tip terminating at the superior cavoatrial junction. Lung volumes are low. No consolidative airspace disease.  No pleural effusions. No pneumothorax. No definite suspicious appearing pulmonary nodules or masses. Pulmonary venous congestion, without frank pulmonary edema. Moderate cardiomegaly. Upper mediastinal contours are within normal limits. Aortic atherosclerosis. IMPRESSION: 1. Low lung volumes with cardiomegaly and pulmonary venous congestion, but no frank pulmonary edema. 2. Aortic atherosclerosis. Electronically Signed   By: Vinnie Langton M.D.   On: 02/10/2019 20:13   Ct Head Wo Contrast  Result Date: 02/09/2019 CLINICAL DATA:  Weakness EXAM: CT HEAD WITHOUT CONTRAST TECHNIQUE: Contiguous axial images were obtained from the base of the skull through the vertex without intravenous contrast. COMPARISON:  CT head dated 02/02/2014. FINDINGS: Brain: No evidence of acute infarction, hemorrhage, hydrocephalus, extra-axial collection or mass lesion/mass effect. There is age related volume loss. There are advanced chronic microvascular ischemic changes bilaterally. Vascular: No hyperdense vessel or unexpected calcification. Skull: Normal. Negative for fracture or focal lesion. Sinuses/Orbits: No acute finding. Postsurgical changes are noted of the left orbit. Other: None. IMPRESSION: 1. No acute intracranial abnormality detected. 2. Chronic microvascular ischemic changes with age related volume loss, progressed from 66. Electronically Signed   By: Constance Holster M.D.   On: 02/09/2019 23:35   Ct Renal Stone Study  Result Date: 02/09/2019 CLINICAL DATA:  Bladder and prostate cancer with increasing left lower quadrant abdominal pain. EXAM: CT ABDOMEN AND PELVIS WITHOUT CONTRAST TECHNIQUE: Multidetector CT imaging of the abdomen and pelvis was performed following the standard protocol without IV contrast. COMPARISON:  None. FINDINGS: Lower chest: There is a 1.7 cm pulmonary nodule in the left lower lobe. This has increased significantly in size from prior study. Heart size is significantly enlarged. There is a  trace right-sided pleural effusion. Hepatobiliary: No focal liver abnormality is seen. No gallstones, gallbladder wall thickening, or biliary dilatation. Pancreas: Unremarkable. No pancreatic ductal dilatation or surrounding inflammatory changes. Spleen: Normal in size without focal abnormality. Adrenals/Urinary Tract: There are bilateral double-J ureteral stents in place. There is some mild bilateral collecting system dilatation. The stents appear to terminate in the decompressed urinary bladder. There is a Foley catheter in place. Stomach/Bowel: The stomach is unremarkable. There is  a moderate amount of stool in the colon. The appendix is unremarkable. There is no evidence of a small-bowel obstruction. Vascular/Lymphatic: An IVC filter is in place. There are some prominent but subcentimeter retroperitoneal lymph nodes. There are prominent inguinal and pelvic lymph nodes. Reproductive: There is a large prostate mass currently measuring approximately 7.8 by 8.4 cm (previously measuring 10 x 11 cm. There is a central low attenuation area measuring approximately 21 Hounsfield units. This may represent a necrotic portion of the tumor. Other: No abdominal wall hernia or abnormality. No abdominopelvic ascites. Musculoskeletal: No acute or significant osseous findings. IMPRESSION: 1. Examination limited by lack of IV contrast. 2. Dominant pelvic mass with interval decrease in size consistent with the patient's known prostate cancer. There is a central low-attenuation area which may represent necrosis. 3. Significant interval increase in size of a left lower lobe pulmonary nodule currently measuring approximately 1.6 cm. This is consistent with metastatic disease. 4. Cardiomegaly.  There is a trace right-sided pleural effusion. 5. Moderate amount of stool throughout the colon. 6. Bilateral double-J ureteral stents are in place. There is mild symmetric bilateral collecting system dilatation without frank hydronephrosis. 7.  IVC filter in place. Electronically Signed   By: Constance Holster M.D.   On: 02/09/2019 23:43    ------  Assessment:  53 y.o. male with metastatic prostate cancer on hospice who presented with urinary tract infection. He has chronic indwelling urinary catheter and ureteral stents in place for urinary obstruction. Worsened bladder spasms are likely the result of his urinary tract infection and are expected to improve as bladder inflammation resolves with antibiotics. Hematuria is at baseline and catheter is currently draining well. Given significant bladder spasms, do not recommend larger foley with continuous bladder irrigation as this may worsen bladder spasms.    Recommendations: 1. Urinary tract infection: Urine cx from 02/09/19 growing GNRs, final results pending. On ceftriaxone. Follow-up culture results and adjust antibiotics approximately.   2. Metastatic prostate cancer: On hospice. Continue symptomatic management.   3. Bladder spasms: Likely acutely worsened due to UTI. Continue home myrbetriq and add B&O suppositories.   4. Gross hematuria: Chronic issue in setting of metastatic prostate cancer. Avoid larger foley with CBI at this time since foley is draining. Monitor hemoglobin and transfuse prn.      Thank you for this consult. Please contact the urology consult pager with any further questions/concerns.

## 2019-02-11 NOTE — Progress Notes (Addendum)
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  Patient was seen in the ED yesterday for the same and noted to have UTI, received 1 dose of IV antibiotics and went home on Bactrim/Keflex.  He returns today with bleeding around the Foley site and low-grade fevers with worsening bladder pressure/spasms.Reports he has Dr. Jeffie Pollock 10 days ago gave him some sample for medication to try to stop the bleeding it helped but now seemed to recurrent  Subjective:  Patient complaining of intermittent severe bladder spasms.  Seen with urology bedside.  He does have gross hematuria in the Foley catheter (changed overnight).  He denies any chest pain or shortness of breath.  States he lives alone.  He is separated from his wife but she visits him every 2 days to help out.   Objective: Vitals:   02/11/19 0707 02/11/19 0730 02/11/19 0800 02/11/19 0830  BP:  (!) 148/87 (!) 159/105 (!) 165/105  Pulse:  94 (!) 111 (!) 108  Resp:  20 16 (!) 24  Temp: 98.6 F (37 C)     TempSrc: Oral     SpO2:  94% 94% 94%    Intake/Output Summary (Last 24 hours) at 02/11/2019 1023 Last data filed at 02/11/2019 0093 Gross per 24 hour  Intake 100 ml  Output 1300 ml  Net -1200 ml   There were no vitals filed for this visit.  Physical Examination:  General exam: Appears calm and comfortable  Respiratory system: Clear to auscultation. Respiratory effort normal. Cardiovascular system: S1 & S2 heard, RRR. No JVD, murmurs, rubs, gallops or clicks. No pedal edema. Gastrointestinal system: Abdomen is nondistended, soft. +suprapubic tenderness.  No  organomegaly or masses felt.  Gross hematuria with some clots noted in the Foley catheter tubing and bag.  Normal bowel sounds heard. Central nervous system: Alert and oriented. No focal neurological deficits. Extremities: Symmetric 5 x 5 power. Skin: No rashes, lesions or ulcers Psychiatry: Judgement and insight appear normal. Mood & affect anxious    Data Reviewed: I have personally reviewed following labs and imaging studies  CBC: Recent Labs  Lab 02/09/19 2053 02/10/19 1749 02/11/19 0704  WBC 12.0* 10.9* 13.0*  NEUTROABS 9.3* 8.5*  --   HGB 7.3* 7.9* 7.7*  HCT 25.0* 27.0* 25.8*  MCV 80.4 81.1 81.6  PLT 342 348 818   Basic Metabolic Panel: Recent Labs  Lab 02/09/19 2053 02/10/19 1749 02/11/19 0704  NA 129* 142 138  K 3.5 3.8 3.6  CL 93* 105 102  CO2 27 24 25   GLUCOSE 106* 126* 121*  BUN 47* 44* 48*  CREATININE 2.35* 1.90* 2.46*  CALCIUM 8.0* 8.7* 8.5*  MG  --   --  2.1  PHOS  --   --  4.8*   GFR: Estimated Creatinine Clearance: 37.4 mL/min (A) (by C-G formula based on SCr of 2.46 mg/dL (H)). Liver Function Tests: Recent Labs  Lab 02/09/19 2053 02/11/19 0704  AST 17 17  ALT 11 12  ALKPHOS 58 65  BILITOT 0.6 0.2*  PROT 8.1 7.8  ALBUMIN 2.9* 2.7*   Recent Labs  Lab 02/09/19 2053  LIPASE 18   No results for input(s): AMMONIA  in the last 168 hours. Coagulation Profile: No results for input(s): INR, PROTIME in the last 168 hours. Cardiac Enzymes: No results for input(s): CKTOTAL, CKMB, CKMBINDEX, TROPONINI in the last 168 hours. BNP (last 3 results) No results for input(s): PROBNP in the last 8760 hours. HbA1C: No results for input(s): HGBA1C in the last 72 hours. CBG: No results for input(s): GLUCAP in the last 168 hours. Lipid Profile: No results for input(s): CHOL, HDL, LDLCALC, TRIG, CHOLHDL, LDLDIRECT in the last 72 hours. Thyroid Function Tests: No results for input(s): TSH, T4TOTAL, FREET4, T3FREE, THYROIDAB in the last 72 hours. Anemia  Panel: No results for input(s): VITAMINB12, FOLATE, FERRITIN, TIBC, IRON, RETICCTPCT in the last 72 hours. Sepsis Labs: Recent Labs  Lab 02/10/19 1749  LATICACIDVEN 1.0    Recent Results (from the past 240 hour(s))  Urine culture     Status: Abnormal (Preliminary result)   Collection Time: 02/09/19 10:05 PM  Result Value Ref Range Status   Specimen Description   Final    URINE, RANDOM Performed at [facility] [address], [place], [place] [address]    Special Requests   Final    NONE Performed at [facility], [place] [address], [place],  [address]    Culture >=100,000 COLONIES/mL GRAM NEGATIVE RODS (A)  Final   Report Status PENDING  Incomplete  SARS Coronavirus 2 (CEPHEID - Performed in [place] hospital lab), Hosp Order     Status: None   Collection Time: 02/10/19  5:49 PM  Result Value Ref Range Status   SARS Coronavirus 2 NEGATIVE NEGATIVE Final    Comment: (NOTE) If result is NEGATIVE SARS-CoV-2 target nucleic acids are NOT DETECTED. The SARS-CoV-2 RNA is generally detectable in upper and lower  respiratory specimens during the acute phase of infection. The lowest  concentration of SARS-CoV-2 viral copies this assay can detect is 250  copies / mL. A negative result does not preclude SARS-CoV-2 infection  and should not be used as the sole basis for treatment or other  patient management decisions.  A negative result may occur with  improper specimen collection / handling, submission of specimen other  than nasopharyngeal swab, presence of viral mutation(s) within the  areas targeted by this assay, and inadequate number of viral copies  (<250 copies / mL). A negative result must be combined with clinical  observations, patient history, and epidemiological information. If result is POSITIVE SARS-CoV-2 target nucleic acids are DETECTED. The SARS-CoV-2 RNA is generally detectable in upper and lower  respiratory specimens  dur ing the acute phase of infection.  Positive  results are indicative of active infection with SARS-CoV-2.  Clinical  correlation with patient history and other diagnostic information is  necessary to determine patient infection status.  Positive results do  not rule out bacterial infection or co-infection with other viruses. If result is PRESUMPTIVE POSTIVE SARS-CoV-2 nucleic acids MAY BE PRESENT.   A presumptive positive result was obtained on the submitted specimen  and confirmed on repeat testing.  While 2019 novel coronavirus  (SARS-CoV-2) nucleic acids may be present in the submitted sample  additional confirmatory testing may be necessary for epidemiological  and / or clinical management purposes  to differentiate between  SARS-CoV-2 and other Sarbecovirus currently known to infect humans.  If clinically indicated additional testing with an alternate test  methodology [id number]) is advised. The SARS-CoV-2 RNA is generally  detectable in upper and lower respiratory sp ecimens during the acute  phase  of infection. The expected result is Negative. Fact Sheet for Patients:  StrictlyIdeas.no Fact Sheet for Healthcare Providers: BankingDealers.co.za This test is not yet approved or cleared by the [place] FDA and has been authorized for detection and/or diagnosis of SARS-CoV-2 by FDA under an Emergency Use Authorization (EUA).  This EUA will remain in effect (meaning this test can be used) for the duration of the COVID-19 declaration under Section 564(b)(1) of the Act, 21 U.S.C. section 360bbb-3(b)(1), unless the authorization is terminated or revoked sooner. Performed at [facility], [place] [address] [place], [place] [address]   Culture, blood (Routine X 2) w Reflex to ID Panel     Status: None (Preliminary result)   Collection Time: 02/10/19  6:07 PM  Result Value Ref Range Status   Specimen Description    Final    RIGHT ANTECUBITAL Performed at [facility] [address] [place], [place] [address]    Special Requests   Final    BOTTLES DRAWN AEROBIC AND ANAEROBIC Blood Culture adequate volume Performed at [facility] [address] [place], [place] [address]    Culture   Final    NO GROWTH < 12 HOURS Performed at [facility] [address], [place], [place] [address]    Report Status PENDING  Incomplete  Culture, blood (Routine X 2) w Reflex to ID Panel     Status: None (Preliminary result)   Collection Time: 02/10/19  6:08 PM  Result Value Ref Range Status   Specimen Description   Final    BLOOD RIGHT HAND Performed at [facility] [address], [place], [place] [address]    Special Requests   Final    BOTTLES DRAWN AEROBIC AND ANAEROBIC Blood Culture results may not be optimal due to an inadequate volume of blood received in culture bottles Performed at [facility] [address], [place], [place] [address]    Culture   Final    NO GROWTH < 12 HOURS Performed at [facility] [address], [place], [place] [address]    Report Status PENDING  Incomplete      Radiology Studies: Dg Chest 2 View  Result Date: 02/10/2019 CLINICAL DATA:  53 year old male with history of prostate cancer. EXAM: CHEST - 2 VIEW COMPARISON:  Chest x-ray 11/10/2018. FINDINGS: Right internal jugular single-lumen power porta cath with tip terminating at the superior cavoatrial junction. Lung volumes are low. No consolidative airspace disease. No pleural effusions. No pneumothorax. No definite suspicious appearing pulmonary nodules or masses. Pulmonary venous congestion, without frank pulmonary edema. Moderate cardiomegaly. Upper mediastinal contours are within normal limits. Aortic atherosclerosis. IMPRESSION: 1. Low lung volumes with cardiomegaly and pulmonary venous congestion, but no frank pulmonary edema.  2. Aortic atherosclerosis. Electronically Signed   By: Vinnie Langton M.D.   On: 02/10/2019 20:13   Ct Head Wo Contrast  Result Date: 02/09/2019 CLINICAL DATA:  Weakness EXAM: CT HEAD WITHOUT CONTRAST TECHNIQUE: Contiguous axial images were obtained from the base of the skull through the vertex without intravenous contrast. COMPARISON:  CT head dated 02/02/2014. FINDINGS: Brain: No evidence of acute infarction, hemorrhage, hydrocephalus, extra-axial collection or mass lesion/mass effect. There is age related volume loss. There are advanced chronic microvascular ischemic changes bilaterally. Vascular: No hyperdense vessel or unexpected calcification. Skull: Normal. Negative for fracture or focal lesion. Sinuses/Orbits: No acute finding. Postsurgical changes are noted of the left orbit. Other: None. IMPRESSION: 1. No  acute intracranial abnormality detected. 2. Chronic microvascular ischemic changes with age related volume loss, progressed from 50. Electronically Signed   By: Constance Holster M.D.   On: 02/09/2019 23:35   Ct Renal Stone Study  Result Date: 02/09/2019 CLINICAL DATA:  Bladder and prostate cancer with increasing left lower quadrant abdominal pain. EXAM: CT ABDOMEN AND PELVIS WITHOUT CONTRAST TECHNIQUE: Multidetector CT imaging of the abdomen and pelvis was performed following the standard protocol without IV contrast. COMPARISON:  None. FINDINGS: Lower chest: There is a 1.7 cm pulmonary nodule in the left lower lobe. This has increased significantly in size from prior study. Heart size is significantly enlarged. There is a trace right-sided pleural effusion. Hepatobiliary: No focal liver abnormality is seen. No gallstones, gallbladder wall thickening, or biliary dilatation. Pancreas: Unremarkable. No pancreatic ductal dilatation or surrounding inflammatory changes. Spleen: Normal in size without focal abnormality. Adrenals/Urinary Tract: There are bilateral double-J ureteral stents in place.  There is some mild bilateral collecting system dilatation. The stents appear to terminate in the decompressed urinary bladder. There is a Foley catheter in place. Stomach/Bowel: The stomach is unremarkable. There is a moderate amount of stool in the colon. The appendix is unremarkable. There is no evidence of a small-bowel obstruction. Vascular/Lymphatic: An IVC filter is in place. There are some prominent but subcentimeter retroperitoneal lymph nodes. There are prominent inguinal and pelvic lymph nodes. Reproductive: There is a large prostate mass currently measuring approximately 7.8 by 8.4 cm (previously measuring 10 x 11 cm. There is a central low attenuation area measuring approximately 21 Hounsfield units. This may represent a necrotic portion of the tumor. Other: No abdominal wall hernia or abnormality. No abdominopelvic ascites. Musculoskeletal: No acute or significant osseous findings. IMPRESSION: 1. Examination limited by lack of IV contrast. 2. Dominant pelvic mass with interval decrease in size consistent with the patient's known prostate cancer. There is a central low-attenuation area which may represent necrosis. 3. Significant interval increase in size of a left lower lobe pulmonary nodule currently measuring approximately 1.6 cm. This is consistent with metastatic disease. 4. Cardiomegaly.  There is a trace right-sided pleural effusion. 5. Moderate amount of stool throughout the colon. 6. Bilateral double-J ureteral stents are in place. There is mild symmetric bilateral collecting system dilatation without frank hydronephrosis. 7. IVC filter in place. Electronically Signed   By: Constance Holster M.D.   On: 02/09/2019 23:43        Scheduled Meds:  escitalopram  10 mg Oral Daily   gabapentin  300 mg Oral TID   metoprolol tartrate  25 mg Oral BID   mirabegron ER  25 mg Oral Daily   morphine  30 mg Oral Q8H   oxyCODONE  5-20 mg Oral See admin instructions   Continuous Infusions:   sodium chloride      Assessment & Plan:    1.  Gross hematuria/suprapubic pain with bladder spasms: Secondary to UTI versus prostrate cancer.  Appreciate urology evaluation and input.  CBI deferred for now as patient would require a larger bore Foley catheter and he may not tolerate.  Will monitor H&H and transfuse as needed.  Continue antispasmodic/pain medications and antibiotics.  2. UTI/sepsis syndrome: Present on admission with tachycardia/fever.  Continue antibiotics and IV fluids.  Will follow-up urine and blood cultures.  3.  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  Patient is status post TURP 04/2018 and status post 6 cycles of chemotherapy.  He is currently on  home hospice, follows Dr. Alen Blew and has poor prognosis.  CODE STATUS discussed with patient by me and by urology this morning.  Although he stated he understood the implications of full code status while on hospice, he would like some time to think about it and he remains full code at this time.  Palliative care team to follow-up further and advise.  Patient would like his wife to make primary decisions although they have been separated.  Alternatively his parents have been involved in his care as well.  4. Acute on CKD Stage II : Likely secondary to obstructive uropathy from prostate cancer/hematuria.  Baseline creatinine appears to be around 1.5 over the last year.  His creatinine was elevated at 2.35 on June 5 and now at 2.4.  Cautious IV fluids given history of diastolic CHF.  5.  Hypertension/CAD/CVA: Not a candidate for antiplatelet agents in the setting of gross hematuria.  Resume metoprolol given elevated blood pressure.  6.  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  Will have IV fentanyl available for breakthrough pain.He is also  on Neurontin 300 mg 3 times daily  7.  Chronic diastolic CHF: Compensated.  Watch on IV fluids  DVT prophylaxis: Status post IVC filter.  Not a candidate for anticoagulants Code Status: Full code for now Family / Patient Communication: Discussed with patient in detail. Disposition Plan: Will downgrade to telemetry status as no beds and stepdown unit and patient hemodynamically stable.  He is also a hospice patient with poor prognosis and does not wish any aggressive interventions.  He is to discuss with family members regarding CODE STATUS and follow-up with palliative care  Will upgrade to inpatient status with anticipated length of stay greater than 2 midnights given ongoing hematuria, need for IV fluids in the setting of acute on chronic kidney disease, need for IV antibiotics in the setting of UTI/sepsis and IV pain medications.   LOS: 0 days    Time spent: 45 minutes    Guilford Shi, MD [facility] Pager [phone]  If 7PM-7AM, please contact night-coverage [email or web address] Password [facility] 02/11/2019, 10:23 AM

## 2019-02-11 NOTE — ED Notes (Signed)
I have just phoned report to [place], RN on [place] and will transport now. Pt. Remains in no distress.

## 2019-02-12 LAB — URINE CULTURE
Culture: >=100000 — AB
Culture: <10000 — AB

## 2019-02-12 LAB — CBC
HCT: 21.8 % — ABNORMAL LOW (ref 39.0–52.0)
Hemoglobin: 6.5 g/dL — CL (ref 13.0–17.0)
MCH: 24.2 pg — ABNORMAL LOW (ref 26.0–34.0)
MCHC: 29.8 g/dL — ABNORMAL LOW (ref 30.0–36.0)
MCV: 81 fL (ref 80.0–100.0)
Platelets: 246 10*3/uL (ref 150–400)
RBC: 2.69 MIL/uL — ABNORMAL LOW (ref 4.22–5.81)
RDW: 19.6 % — ABNORMAL HIGH (ref 11.5–15.5)
WBC: 9.2 10*3/uL (ref 4.0–10.5)
nRBC: 0 % (ref 0.0–0.2)

## 2019-02-12 LAB — BASIC METABOLIC PANEL
Anion gap: 8 (ref 5–15)
BUN: 42 mg/dL — ABNORMAL HIGH (ref 6–20)
CO2: 26 mmol/L (ref 22–32)
Calcium: 8 mg/dL — ABNORMAL LOW (ref 8.9–10.3)
Chloride: 105 mmol/L (ref 98–111)
Creatinine, Ser: 2.02 mg/dL — ABNORMAL HIGH (ref 0.61–1.24)
GFR calc Af Amer: 43 mL/min — ABNORMAL LOW (ref >60)
GFR calc non Af Amer: 37 mL/min — ABNORMAL LOW (ref >60)
Glucose, Bld: 129 mg/dL — ABNORMAL HIGH (ref 70–99)
Potassium: 3.3 mmol/L — ABNORMAL LOW (ref 3.5–5.1)
Sodium: 139 mmol/L (ref 135–145)

## 2019-02-12 LAB — HEMOGLOBIN AND HEMATOCRIT, BLOOD
HCT: 21.7 % — ABNORMAL LOW (ref 39.0–52.0)
HCT: 27.8 % — ABNORMAL LOW (ref 39.0–52.0)
Hemoglobin: 6.6 g/dL — CL (ref 13.0–17.0)
Hemoglobin: 8.6 g/dL — ABNORMAL LOW (ref 13.0–17.0)

## 2019-02-12 LAB — CREATININE, SERUM
Creatinine, Ser: 2.18 mg/dL — ABNORMAL HIGH (ref 0.61–1.24)
GFR calc Af Amer: 39 mL/min — ABNORMAL LOW (ref >60)
GFR calc non Af Amer: 34 mL/min — ABNORMAL LOW (ref >60)

## 2019-02-12 LAB — PREPARE RBC (CROSSMATCH)

## 2019-02-12 MED ORDER — ENSURE ENLIVE PO LIQD
237.0000 mL | Freq: Three times a day (TID) | ORAL | Status: DC
  Administered 2019-02-12 – 2019-02-15 (×9): 237 mL via ORAL

## 2019-02-12 MED ORDER — CYCLOBENZAPRINE HCL 5 MG PO TABS
5.0000 mg | ORAL_TABLET | Freq: Once | ORAL | Status: AC
  Administered 2019-02-13: 5 mg via ORAL
  Filled 2019-02-12: qty 1

## 2019-02-12 NOTE — Progress Notes (Signed)
Urology Progress Note    Subjective: NAEON. Patient reports that his bladder spasms have significantly improved in the past 24 hours. He is now DNR.  Hematuria significantly improved today. Hgb 6.6 and receiving blood transfusion. Creatinine improved to 2.02 today.   Objective: Vital signs in last 24 hours: Temp:  [97.7 F (36.5 C)-99.3 F (37.4 C)] 98.6 F (37 C) (06/08 1744) Pulse Rate:  [81-96] 92 (06/08 1744) Resp:  [16-20] 18 (06/08 1744) BP: (119-142)/(76-93) 142/91 (06/08 1744) SpO2:  [92 %-99 %] 98 % (06/08 1744)  Intake/Output from previous day: 06/07 0701 - 06/08 0700 In: 1352.3 [P.O.:200; I.V.:952.3; IV Piggyback:200] Out: 5597 [Urine:3175] Intake/Output this shift: Total I/O In: 1070.5 [I.V.:220.5; Blood:750; IV Piggyback:100] Out: 1500 [Urine:1500]  Physical Exam:  General: Alert and oriented CV: HDS, regular rate Lungs: NWOB Abdomen: Soft, non-tender.  GU: Foley in place draining clear yellow urine  Ext: NT, No erythema  Lab Results: Recent Labs    02/11/19 1552 02/12/19 0703 02/12/19 0954  HGB 7.1* 6.6* 6.5*  HCT 24.3* 21.7* 21.8*   BMET Recent Labs    02/11/19 0704 02/12/19 0703 02/12/19 0954  NA 138  --  139  K 3.6  --  3.3*  CL 102  --  105  CO2 25  --  26  GLUCOSE 121*  --  129*  BUN 48*  --  42*  CREATININE 2.46* 2.18* 2.02*  CALCIUM 8.5*  --  8.0*     Studies/Results: Dg Chest 2 View  Result Date: 02/10/2019 CLINICAL DATA:  53 year old male with history of prostate cancer. EXAM: CHEST - 2 VIEW COMPARISON:  Chest x-ray 11/10/2018. FINDINGS: Right internal jugular single-lumen power porta cath with tip terminating at the superior cavoatrial junction. Lung volumes are low. No consolidative airspace disease. No pleural effusions. No pneumothorax. No definite suspicious appearing pulmonary nodules or masses. Pulmonary venous congestion, without frank pulmonary edema. Moderate cardiomegaly. Upper mediastinal contours are within normal  limits. Aortic atherosclerosis. IMPRESSION: 1. Low lung volumes with cardiomegaly and pulmonary venous congestion, but no frank pulmonary edema. 2. Aortic atherosclerosis. Electronically Signed   By: Vinnie Langton M.D.   On: 02/10/2019 20:13    Assessment/Plan:  53 y.o. male with metastatic prostate cancer on hospice who presented with urinary tract infection. He has chronic indwelling urinary catheter and ureteral stents in place for urinary obstruction. Worsened bladder spasms are likely the result of his urinary tract infection and are expected to improve as bladder inflammation resolves with antibiotics. Hematuria is at baseline and catheter is currently draining well. Given significant bladder spasms, do not recommend larger foley with continuous bladder irrigation as this may worsen bladder spasms.   Recommendations: 1. Urinary tract infection: Urine cx from 02/09/19 growing Pseudomonas putida. On cefepime.   2. Metastatic prostate cancer: On hospice. Continue symptomatic management.   3. Bladder spasms: Likely acutely worsened due to UTI. Continue home myrbetriq and add B&O suppositories.   4. Gross hematuria: Chronic issue in setting of metastatic prostate cancer with significant improvement today. Avoid larger foley with CBI at this time since foley is draining. Monitor hemoglobin and transfuse prn.     LOS: 1 day

## 2019-02-12 NOTE — Progress Notes (Signed)
[facility]) Hospital Liaison: RN note  This is a related and Non-covered GIP admission on  02/11/2019 with [facility]) diagnosis of prostate cancer per Dr. Eulas Post. Patient is a full code. Pt recently had a ED visit on 6/5 for dehydration/UTI and d/c home on ABT on 6/6. Pt informed staff he needed nasal cannulas however upon arrival the visiting SN the spouse indicated pt was bleeding clots on the floor from his foley cath. SN further investigated and confirmed pt was bleeding. EMS was activated and pt transported to [facility] ED.  Pt was admitted to the hospital for hematuria.  Spoke with patient on phone and received report from his bedside nurse. Patient reports feeling better today compared to yesterday. He is receiving a blood transfusion. Nurse reports stable VS and states he was able to have bowel movement this am.   VS: 98.9, 136/89, 85, 18, 97% RA I/O: 1352/1822  Labs: K+ 3.3, BUN 42, Creatinine 2.02, Calcium 8.0, GFR 43, RBC 2.69, Hgb 6.5, HCT 21.8, MCH24.2, MCHC 29.8, RDW 19.6  Medications: NS@ 90ml/hr, Maxipime 2g IVPB BID, Miralax 17g PO @ 0804, 2 units PRBCs  MD Notes, Urology: Assessment:  53 y.o. male with metastatic prostate cancer on hospice who presented with urinary tract infection. He has chronic indwelling urinary catheter and ureteral stents in place for urinary obstruction. Worsened bladder spasms are likely the result of his urinary tract infection and are expected to improve as bladder inflammation resolves with antibiotics. Hematuria is at baseline and catheter is currently draining well. Given significant bladder spasms, do not recommend larger foley with continuous bladder irrigation as this may worsen bladder spasms.   Recommendations: 1. Urinary tract infection: Urine cx from 02/09/19 growing GNRs, final results pending. On ceftriaxone. Follow-up culture results and adjust antibiotics approximately.   2. Metastatic prostate cancer: On  hospice. Continue symptomatic management.   3. Bladder spasms: Likely acutely worsened due to UTI. Continue home myrbetriq and add B&O suppositories.   4. Gross hematuria: Chronic issue in setting of metastatic prostate cancer. Avoid larger foley with CBI at this time since foley is draining. Monitor hemoglobin and transfuse prn.   PCG: Spoke with wife on phone, provided emotional support.  IDG: team updated.  GOC: patient is now a DNR.   [facility] will continue to follow while in-patient. Plan would be for pt to return home with Hospice upon discharge.  Please call with any hospice related questions/concerns. Please use [facility] for ambulance transport if needed at time of discharge.  Thank You.   Farrel Gordon, RN, [facility] Liaison [phone]  [place] are listed on AMION under [facility].

## 2019-02-12 NOTE — Progress Notes (Signed)
Manufacturing engineer SW Note 02/12/2019  LCSW left voice mail on Pt phone at 03:20pm noting desire to check on him/requested call back. LCSW then called Pt hospital room number and awoke him/he stated that he was trying to take a nap. He stated lighter pain today than yesterday and is feeling better than yesterday. He denies information re: discharge back home and does not believe it will be today since he has not heard anything today. LCSW acknowledged difficulty of Pt not being able to be with wife, Dionne. Pt appreciative of LCSW call. LCSW then called Pt wife, Dionne, and she discussed difficulty recently with Pt status/noted aggression with Pt related to frustration with recent constipation and her resulting choice to call 911 for Pt. She notes Pt has asked why she is not there with him in the hospital though Dionne is aware she cannot visit due to COVID-19 restrictions. LCSW encouraged her self care/noted lack of awareness of discharge date for Pt. LCSW encouraged her to call DSS Case Worker today re: an extension for Pt Medicaid application if she is unable to get the information needed to complete the application by June 10XN, the date that his application will be denied. She agreed and states she has the case workers number. LCSW then called [place], Dionna, and provided her with update from talking with Pt/also informed her that Pt will need [facility] transport home whenever discharge is ready/verified that she has contact information for Pt wife, Dionne.  Please call us with any questions or concerns.   Thank you,  Christena Deem, LCSW Licensed Clinical Social Training and development officer [phone]

## 2019-02-12 NOTE — Progress Notes (Signed)
CRITICAL VALUE ALERT  Critical Value:  Hgb 6.6  Date & Time Notied:  02/12/19, 0820  Provider Notified: Earnest Conroy  Orders Received/Actions taken: awaiting orders

## 2019-02-12 NOTE — Progress Notes (Signed)
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  Patient was seen in the ED yesterday for the same and noted to have UTI, received 1 dose of IV antibiotics and went home on Bactrim/Keflex.  He returns today with bleeding around the Foley site and low-grade fevers with worsening bladder pressure/spasms.Reports he has Dr. Jeffie [place] 10 days ago gave him some sample for medication to try to stop the bleeding it helped but now seemed to recurrent  Subjective: Hematuria and bladder spasms improving.  His blood count dropped to 6.6 this morning and receiving blood transfusion.  Seen by primary oncologist earlier today.  Objective: Vitals:   02/12/19 0207 02/12/19 0450 02/12/19 1012 02/12/19 1045  BP: 119/76 125/86 119/85 132/87  Pulse: 85 81 86 89  Resp: 16 16  20   Temp: 98.6 F (37 C) 97.7 F (36.5 C)  98.6 F (37 C)  TempSrc: Oral Oral  Oral  SpO2: 93% 94%  99%  Weight:      Height:        Intake/Output Summary (Last 24 hours) at 02/12/2019 1057 Last data filed at 02/12/2019 0700 Gross per 24 hour  Intake 1352.28 ml  Output 2275 ml  Net -922.72 ml   Filed Weights   02/11/19 1213  Weight: 84.7 kg    Physical Examination:  General exam: Appears calm and comfortable  Respiratory system: Clear to auscultation. Respiratory effort normal. Cardiovascular system: S1 & S2 heard, RRR. No JVD, murmurs, rubs, gallops or clicks. No pedal edema. Gastrointestinal system: Abdomen is nondistended, soft. +suprapubic tenderness.  No organomegaly or masses felt.  Gross hematuria with some clots noted in the Foley catheter tubing  and bag.  Normal bowel sounds heard. Central nervous system: Alert and oriented. No focal neurological deficits. Extremities: Symmetric 5 x 5 power. Skin: No rashes, lesions or ulcers Psychiatry: Judgement and insight appear normal. Mood & affect anxious    Data Reviewed: I have personally reviewed following labs and imaging studies  CBC: Recent Labs  Lab 02/09/19 2053 02/10/19 1749 02/11/19 0704 02/11/19 1313 02/11/19 1552 02/12/19 0703  WBC 12.0* 10.9* 13.0*  --   --   --   NEUTROABS 9.3* 8.5*  --   --   --   --   HGB 7.3* 7.9* 7.7* 7.6* 7.1* 6.6*  HCT 25.0* 27.0* 25.8* 26.4* 24.3* 21.7*  MCV 80.4 81.1 81.6  --   --   --   PLT 342 348 308  --   --   --    Basic Metabolic Panel: Recent Labs  Lab 02/09/19 2053 02/10/19 1749 02/11/19 0704 02/12/19 0703 02/12/19 0954  NA 129* 142 138  --  139  K 3.5 3.8 3.6  --  3.3*  CL 93* 105 102  --  105  CO2 27 24 25   --  26  GLUCOSE 106* 126* 121*  --  129*  BUN 47* 44* 48*  --  42*  CREATININE 2.35* 1.90* 2.46* 2.18* 2.02*  CALCIUM 8.0* 8.7* 8.5*  --  8.0*  MG  --   --  2.1  --   --   PHOS  --   --  4.8*  --   --  GFR: Estimated Creatinine Clearance: 45.6 mL/min (A) (by C-G formula based on SCr of 2.02 mg/dL (H)). Liver Function Tests: Recent Labs  Lab 02/09/19 2053 02/11/19 0704  AST 17 17  ALT 11 12  ALKPHOS 58 65  BILITOT 0.6 0.2*  PROT 8.1 7.8  ALBUMIN 2.9* 2.7*   Recent Labs  Lab 02/09/19 2053  LIPASE 18   No results for input(s): AMMONIA in the last 168 hours. Coagulation Profile: No results for input(s): INR, PROTIME in the last 168 hours. Cardiac Enzymes: No results for input(s): CKTOTAL, CKMB, CKMBINDEX, TROPONINI in the last 168 hours. BNP (last 3 results) No results for input(s): PROBNP in the last 8760 hours. HbA1C: No results for input(s): HGBA1C in the last 72 hours. CBG: No results for input(s): GLUCAP in the last 168 hours. Lipid Profile: No results for input(s): CHOL, HDL, LDLCALC,  TRIG, CHOLHDL, LDLDIRECT in the last 72 hours. Thyroid Function Tests: Recent Labs    02/11/19 0704  TSH 4.253   Anemia Panel: No results for input(s): VITAMINB12, FOLATE, FERRITIN, TIBC, IRON, RETICCTPCT in the last 72 hours. Sepsis Labs: Recent Labs  Lab 02/10/19 1749 02/11/19 1313 02/11/19 1552  PROCALCITON  --  0.71  --   LATICACIDVEN 1.0 0.9 0.6    Recent Results (from the past 240 hour(s))  Urine culture     Status: Abnormal   Collection Time: 02/09/19 10:05 PM  Result Value Ref Range Status   Specimen Description   Final    URINE, RANDOM Performed at [facility] [address] [place], [place] [address]    Special Requests   Final    NONE Performed at [facility], [place] [address] [place], [place] [address]    Culture >=100,000 COLONIES/mL PSEUDOMONAS PUTIDA (A)  Final   Report Status 02/12/2019 FINAL  Final   Organism ID, Bacteria PSEUDOMONAS PUTIDA (A)  Final      Susceptibility   Pseudomonas putida - MIC*    CEFTAZIDIME 4 SENSITIVE Sensitive     CIPROFLOXACIN <=0.25 SENSITIVE Sensitive     GENTAMICIN <=1 SENSITIVE Sensitive     IMIPENEM 1 SENSITIVE Sensitive     PIP/TAZO 32 INTERMEDIATE Intermediate     CEFEPIME 2 SENSITIVE Sensitive     * >=100,000 COLONIES/mL PSEUDOMONAS PUTIDA  Urine Culture     Status: Abnormal   Collection Time: 02/10/19  4:55 PM  Result Value Ref Range Status   Specimen Description   Final    URINE, RANDOM Performed at [facility] [address], [place], [place] [address]    Special Requests   Final    NONE Performed at [facility], [place] [address], [place], [place] [address]    Culture (A)  Final    <10,000 COLONIES/mL INSIGNIFICANT GROWTH Performed at [facility] [address], [place], [place] [address]    Report Status 02/12/2019 FINAL  Final  SARS Coronavirus 2 (CEPHEID - Performed in [facility] hospital lab), Hosp Order      Status: None   Collection Time: 02/10/19  5:49 PM  Result Value Ref Range Status   SARS Coronavirus 2 NEGATIVE NEGATIVE Final    Comment: (NOTE) If result is NEGATIVE SARS-CoV-2 target nucleic acids are NOT DETECTED. The SARS-CoV-2 RNA is generally detectable in upper and lower  respiratory specimens during the acute phase of infection. The lowest  concentration of SARS-CoV-2 viral copies this assay can detect is 250  copies / mL. A  negative result does not preclude SARS-CoV-2 infection  and should not be used as the sole basis for treatment or other  patient management decisions.  A negative result may occur with  improper specimen collection / handling, submission of specimen other  than nasopharyngeal swab, presence of viral mutation(s) within the  areas targeted by this assay, and inadequate number of viral copies  (<250 copies / mL). A negative result must be combined with clinical  observations, patient history, and epidemiological information. If result is POSITIVE SARS-CoV-2 target nucleic acids are DETECTED. The SARS-CoV-2 RNA is generally detectable in upper and lower  respiratory specimens dur ing the acute phase of infection.  Positive  results are indicative of active infection with SARS-CoV-2.  Clinical  correlation with patient history and other diagnostic information is  necessary to determine patient infection status.  Positive results do  not rule out bacterial infection or co-infection with other viruses. If result is PRESUMPTIVE POSTIVE SARS-CoV-2 nucleic acids MAY BE PRESENT.   A presumptive positive result was obtained on the submitted specimen  and confirmed on repeat testing.  While 2019 novel coronavirus  (SARS-CoV-2) nucleic acids may be present in the submitted sample  additional confirmatory testing may be necessary for epidemiological  and / or clinical management purposes  to differentiate between  SARS-CoV-2 and other Sarbecovirus currently known to  infect humans.  If clinically indicated additional testing with an alternate test  methodology [phone]) is advised. The SARS-CoV-2 RNA is generally  detectable in upper and lower respiratory sp ecimens during the acute  phase of infection. The expected result is Negative. Fact Sheet for Patients:  StrictlyIdeas.no Fact Sheet for Healthcare Providers: BankingDealers.co.za This test is not yet approved or cleared by the [place] FDA and has been authorized for detection and/or diagnosis of SARS-CoV-2 by FDA under an Emergency Use Authorization (EUA).  This EUA will remain in effect (meaning this test can be used) for the duration of the COVID-19 declaration under Section 564(b)(1) of the Act, 21 U.S.C. section 360bbb-3(b)(1), unless the authorization is terminated or revoked sooner. Performed at [facility], [place] [address] [place], [place] [address]   Culture, blood (Routine X 2) w Reflex to ID Panel     Status: None (Preliminary result)   Collection Time: 02/10/19  6:07 PM  Result Value Ref Range Status   Specimen Description   Final    RIGHT ANTECUBITAL Performed at [facility] [address] [place], [place] [address]    Special Requests   Final    BOTTLES DRAWN AEROBIC AND ANAEROBIC Blood Culture adequate volume Performed at [facility] [address], [place], [place] [address]    Culture   Final    NO GROWTH 2 DAYS Performed at [facility] [address] [place], [place] [address]    Report Status PENDING  Incomplete  Culture, blood (Routine X 2) w Reflex to ID Panel     Status: None (Preliminary result)   Collection Time: 02/10/19  6:08 PM  Result Value Ref Range Status   Specimen Description   Final    BLOOD RIGHT HAND Performed at [facility] [address] [place], [place] [address]    Special Requests   Final    BOTTLES  DRAWN AEROBIC AND ANAEROBIC Blood Culture results may not be optimal due to an inadequate volume of blood received in culture bottles Performed at [facility]  [place] [place], [place] [address]    Culture   Final    NO GROWTH 2 DAYS Performed at [facility], [place] [address], [place], [place] [address]    Report Status PENDING  Incomplete      Radiology Studies: Dg Chest 2 View  Result Date: 02/10/2019 CLINICAL DATA:  53 year old male with history of prostate cancer. EXAM: CHEST - 2 VIEW COMPARISON:  Chest x-ray 11/10/2018. FINDINGS: Right internal jugular single-lumen power porta cath with tip terminating at the superior cavoatrial junction. Lung volumes are low. No consolidative airspace disease. No pleural effusions. No pneumothorax. No definite suspicious appearing pulmonary nodules or masses. Pulmonary venous congestion, without frank pulmonary edema. Moderate cardiomegaly. Upper mediastinal contours are within normal limits. Aortic atherosclerosis. IMPRESSION: 1. Low lung volumes with cardiomegaly and pulmonary venous congestion, but no frank pulmonary edema. 2. Aortic atherosclerosis. Electronically Signed   By: Vinnie Langton M.D.   On: 02/10/2019 20:13        Scheduled Meds: . escitalopram  10 mg Oral Daily  . feeding supplement (ENSURE ENLIVE)  237 mL Oral BID BM  . gabapentin  300 mg Oral TID  . metoprolol tartrate  25 mg Oral BID  . mirabegron ER  25 mg Oral Daily  . morphine  30 mg Oral Q8H  . pneumococcal 23 valent vaccine  0.5 mL Intramuscular Tomorrow-1000   Continuous Infusions: . sodium chloride 75 mL/hr at 02/12/19 0902  . ceFEPime (MAXIPIME) IV 2 g (02/12/19 1011)    Assessment & Plan:    1.  Gross hematuria/suprapubic pain with bladder spasms: Secondary to UTI versus prostrate cancer.  Appreciate urology evaluation and input.  CBI deferred admission as patient would require a larger bore Foley catheter and he may not  tolerate.  Improving with IV hydration, monitor hemoglobin and transfuse as needed.  Receiving 2 units today.  Continue antispasmodic/pain medications and antibiotics.  2. UTI/sepsis syndrome: Present on admission with tachycardia/fever.  Continue antibiotics and IV fluids.  Will follow-up urine and blood cultures.  3.  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  Patient is status post TURP 04/2018 and status post 6 cycles of chemotherapy.  He is currently on home hospice, follows Dr. Alen Blew and has poor prognosis.  Please see below for goals of care discussion with patient and wife today.  [facility] hospice team following.  4. Acute on CKD Stage II : Likely secondary to obstructive uropathy from prostate cancer/hematuria.  Baseline creatinine appears to be around 1.5 over the last year.  His creatinine was elevated at 2.4 admission and now improving with IV hydration.  Cautious IV fluids given history of diastolic CHF.   5.  Hypertension/CAD/CVA: Not a candidate for antiplatelet agents in the setting of gross hematuria.  Resumed metoprolol given elevated blood pressure.  6.  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 IV fentanyl available for breakthrough pain.He is also on Neurontin 300 mg 3 times daily  7.  Chronic diastolic CHF: Compensated.  Watch on IV fluids  8.  Hypokalemia: Replace  DVT prophylaxis: Status post IVC filter.  Not a candidate for anticoagulants Code Status: Full code for now Family / Patient Communication: Discussed with patient in detail. Disposition Plan:   He is a hospice patient with poor prognosis and does not wish any  aggressive interventions.  Discussed again in detail with  with patient and wife,Dionne Schermer, on speaker phone regarding poor prognosis and noncurable nature of his  malignancy (as reiterated by his primary oncologist today).  Patient at this time understands that life support measures like intubation/resuscitation would not alter his underlying malignancy/course of illness.  After discussing with his wife He decided to change his CODE STATUS to DNR.  He would like his wife Dionne to be his healthcare proxy if he were not able to make his own decisions.  Bedside nurse present in the room during discussion with patient and his wife.  DNR order placed.     LOS: 1 day    Time spent: 45 minutes    Guilford Shi, MD [facility] Pager [phone]  If 7PM-7AM, please contact night-coverage [email or web address] Password [facility] 02/12/2019, 10:57 AM

## 2019-02-12 NOTE — Progress Notes (Signed)
Initial Nutrition Assessment  RD working remotely.  DOCUMENTATION CODES:   Not applicable, suspect some degree of malnutrition but unable to diagnose at this time without NFPE  INTERVENTION:   - Ensure Enlive po TID, each supplement provides 350 kcal and 20 grams of protein  - Magic cup BID with meals, each supplement provides 290 kcal and 9 grams of protein  - Provided "High-Calorie, High-Protein Nutrition Therapy" education and handout from the Academy of Nutrition and Dietetics  NUTRITION DIAGNOSIS:   Increased nutrient needs related to cancer and cancer related treatments as evidenced by estimated needs.  GOAL:   Patient will meet greater than or equal to 90% of their needs  MONITOR:   PO intake, Labs, I & O's, Weight trends, Supplement acceptance  REASON FOR ASSESSMENT:   Malnutrition Screening Tool, Consult Assessment of nutrition requirement/status, Malnutrition Eval  ASSESSMENT:   53 year old male who presented to the ED on 6/06 with hematuria. PMH of prostate cancer with mets to the lung on hospice, CKD stage II, CHF, HTN.  Spoke with pt via phone call to room. Pt reports that he has a good appetite and eats well at home. Pt states that his weight "keeps going down because of the cancer."  Pt reports that his wife cooks for him and brings 3 meals daily to his house. Pt reports that a typical meal may include baked chicken, broccoli, and corn. Pt reports that he may have 1-2 snacks daily between meals which may include cookies, potato chips, an orange, an apple, or grapes.  Pt states that he began losing weight in August 2019 when he was first diagnosed with cancer. Pt reports that his UBW prior to any weight loss was 200-210 lbs.  Reviewed weight history in chart. Pt with a 14.3 kg weight loss (14.4%) since December 2019. This is significant for timeframe. Suspect pt with malnutrition but unable to diagnose at this time without completion of NFPE.  Pt states that  he has been thinking about drinking oral nutrition supplements at home. Pt states that he has received Ensure since admission and likes the strawberry and vanilla flavors. RD encouraged pt to purchase some oral nutrition supplements for consumption at home.  RD provided "High-Calorie, High-Protein Nutrition Therapy" handout and education. Handout attached to pt's discharge instructions. Pt very appreciative of RD's help.  Reviewed RN edema assessment. Pt with deep pitting edema to RLE and mild pitting edema to LLE.  Meal Completion: 100% x 1 recorded meal  Medications reviewed and include: Ensure Enlive BID, IV abx IVF: NS @ 75 ml/hr  Labs reviewed: potassium 3.3 (L), hemoglobin 6.5 (L)  UOP: 3175 ml x 24 hours I/O's: -3.0 L since admit  NUTRITION - FOCUSED PHYSICAL EXAM:  Unable to complete at this time. RD working remotely.  Diet Order:   Diet Order            Diet Heart Room service appropriate? Yes; Fluid consistency: Thin  Diet effective now              EDUCATION NEEDS:   Education needs have been addressed  Skin:  Skin Assessment: Reviewed RN Assessment  Last BM:  02/11/19  Height:   Ht Readings from Last 1 Encounters:  02/11/19 5\' 11"  (1.803 m)    Weight:   Wt Readings from Last 1 Encounters:  02/11/19 84.7 kg    Ideal Body Weight:  78.2 kg  BMI:  Body mass index is 26.04 kg/m.  Estimated Nutritional Needs:  Kcal:  2229-7989  Protein:  105-120 grams  Fluid:  >/= 2.0 L    Gaynell Face, MS, RD, LDN Inpatient Clinical Dietitian Pager: [phone] Weekend/After Hours: [phone]

## 2019-02-12 NOTE — Discharge Instructions (Signed)
HIGH-CALORIE, HIGH-PROTEIN NUTRITION THERAPY  A high-calorie, high-protein diet has been recommended for you either because you cant eat enough calories throughout the day, have lost weight, or need to add protein to your diet. Following the recommendations on this handout can help you:  Gain weight and give your body energy  Get more protein from foods that help your body heal and grow strong  Recover from surgery or illness  Tips to Eat More Calories and Protein:  1. Aim for at [facility] 6 Meals and Snacks Each Day  Extra meals and snacks can help you get enough calories and protein.  You may want to try high-calorie supplement drinks (made at home or bought at a store) periodically between meals to get more calories each day. ? If you buy the drink at the store, read the label to look for products with 200-400 calories per serving. ? If you make the drink at home, you can increase calories by adding protein ingredients such as nonfat milk, low-fat yogurt, nonfat milk powder, or protein powder.  Enjoy snacks such as milkshakes, smoothies, pudding, ice cream, or custard.  2. Eat More Fat  Fat provides a lot of calories in just a few bites. A tablespoon of oil, butter, or margarine has about 100 calories.  Add butter, margarine, or oil to bread, potatoes, vegetables, and soups.  Use mayonnaise, salad dressing, and peanut butter freely.  3. Choose High-Protein Foods  Enjoy milk, eggs, cheese, meat, fish, poultry, and beans. Consider trying protein powders and meal replacement shakes and bars.  Choose higher-fat meats. They have more calories than lean meats. ? Examples include chicken thighs, marbled meats, bacon, sausage, poultry with skin  Choose whole milk instead of low-fat or skim milk.  Eat high-fat cheeses instead of low-fat or nonfat cheeses.  4. Shopping Tips  Avoid diet, low-calorie, or low-fat food items.  Look for dairy products (milk, cheese, yogurt, cottage  cheese) that are labeled whole fat or have at least 4% fat.  Purchase nonfat dry milk powder or protein powder to use to make shakes or other blended recipes.    5. Cooking Tips  Make a high-protein milk recipe like the one below. The recipe can be prepared in advance and stored in the refrigerator until you are ready to drink it. Use this high-protein milk in recipes that call for milk or drink it as a beverage.  ? 1 cup whole milk ?  cup nonfat dry milk powder  Add cheese sauce, butter, and sour cream to vegetable and potato dishes.  Get extra calories by adding condensed milk, cream, butter, nut butters, and sweetener to hot cereals, mashed potato, pudding, and soups. Examples: ? Prepare oatmeal with condensed milk, butter/nut butter, and brown sugar ? Prepare mashed potatoes with cream, butter, and cheese ? Prepare soup with cream and extra butter, or puree the soup with cream to make a bisque ? Add cream to pudding mix or use pudding dry mix in cakes/baked goods  Serve items with extra sauces. These contain additional calories: ? Gravy on meats and potatoes ? Extra mayonnaise, BBQ sauce or ketchup  Dipping sauces, hummus, and regular (not low-fat/low-calorie) salad dressing     Foods Recommended Foods Recommended Calories Protein in grams (g)      Protein Foods    1 cup cooked dried beans 240 14   cup chicken salad 200 14  1 egg cooked with 1 tablespoon butter 175 6  3 ounces tuna canned in oil  170 25   cup egg substitute 25 5      1  ounce pecans (20 halves) 200 3  1 ounce macadamia nuts (10-12 nuts) 200 2  1 ounce [place] nuts (6-8 nuts) 190 4  1 ounce walnuts (14 halves) 185 4  1 ounce shelled sunflower seeds 175 6  1 ounce almonds (about 24) 165 4  1 ounce peanuts 165 7  1 tablespoon peanut butter 95 4       cup canned evaporated milk (can be used instead of water when cooking) 170 9  6 ounces sweetened yogurt 165 6   cup ice cream 130 2-3    cup (1 ounce) shredded cheese 115 7   cup creamed cottage cheese 110 13   cup half-and-half 80 2   cup whole milk (can be used instead of water when cooking) 75 4  1 tablespoon cream cheese 50 1  2 tablespoons sour cream 50 0      Fats    1 tablespoon butter, margarine, oil, or mayonnaise 100 0  2 tablespoons gravy 4 1      Sweets    1 tablespoon honey 60 0  1 tablespoon sugar, jam, jelly, or chocolate syrup 50 0      Meal Replacements    1 meal replacement bar 200 15  1 scoop (1 ounce) protein powder 100 15  1 tablespoon protein powder 40 5      High-Calorie, High-Protein Sample 1-Day Menu Breakfast 1 large egg, scrambled 1 medium biscuit 1 tablespoon jam 2 tablespoon butter 1 cup apple juice  Morning Snack 1 cup instant pudding  Lunch 4 oz tuna salad (with mayonnaise, oil, relish) 1 hard-boiled egg 2 canned peach halves 2 tablespoons cream cheese 4 walnut halves 1 cup grape juice  Afternoon Snack 1/2 cup orange juice in smoothie 1/4 cup frozen strawberries in smoothie 1 banana in smoothie 1 oz protein powder in smoothie  Evening Meal 3 oz ground beef patty 2 tablespoons gravy 3 large stalks broccoli 2 tablespoons cheese sauce 2 slices bread 1 tablespoon butter  Evening Snack 1 medium scoop ice cream 2 tablespoons chocolate syrup

## 2019-02-12 NOTE — Progress Notes (Signed)
[facility]) Hospital Liaison: RN  This patient is a self pay with [facility]. This admission is related to his hospice diagnosis but is not a covered admission.   Please call with any hospice related questions/concerns.   Thank You.   Farrel Gordon, RN, [facility] Hospital Liaison [phone]  [place] are listed on AMION under [facility].

## 2019-02-12 NOTE — Progress Notes (Signed)
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  He has been on hospice and supportive care only.  He was hospitalized for urinary tract infection and hematuria.  I agree with the current management at this time with continued supportive management.  He is not currently receiving anticancer treatment and will not receive any in the future.  Please call with any questions regarding this pleasant gentleman.

## 2019-02-13 ENCOUNTER — Encounter (HOSPITAL_COMMUNITY): Payer: Self-pay | Admitting: *Deleted

## 2019-02-13 LAB — BPAM RBC
Blood Product Expiration Date: 202007052359
Blood Product Expiration Date: 202007052359
ISSUE DATE / TIME: 202006081055
ISSUE DATE / TIME: 202006081422
Unit Type and Rh: 5100
Unit Type and Rh: 5100

## 2019-02-13 LAB — TYPE AND SCREEN
ABO/RH(D): O POS
Antibody Screen: NEGATIVE
Unit division: 0
Unit division: 0

## 2019-02-13 LAB — HEMOGLOBIN AND HEMATOCRIT, BLOOD
HCT: 28.2 % — ABNORMAL LOW (ref 39.0–52.0)
Hemoglobin: 8.5 g/dL — ABNORMAL LOW (ref 13.0–17.0)

## 2019-02-13 LAB — BASIC METABOLIC PANEL
Anion gap: 7 (ref 5–15)
BUN: 37 mg/dL — ABNORMAL HIGH (ref 6–20)
CO2: 26 mmol/L (ref 22–32)
Calcium: 8.2 mg/dL — ABNORMAL LOW (ref 8.9–10.3)
Chloride: 107 mmol/L (ref 98–111)
Creatinine, Ser: 1.45 mg/dL — ABNORMAL HIGH (ref 0.61–1.24)
GFR calc Af Amer: >60 mL/min (ref >60)
GFR calc non Af Amer: 55 mL/min — ABNORMAL LOW (ref >60)
Glucose, Bld: 117 mg/dL — ABNORMAL HIGH (ref 70–99)
Potassium: 3.5 mmol/L (ref 3.5–5.1)
Sodium: 140 mmol/L (ref 135–145)

## 2019-02-13 LAB — CBC
HCT: 26.7 % — ABNORMAL LOW (ref 39.0–52.0)
Hemoglobin: 7.9 g/dL — ABNORMAL LOW (ref 13.0–17.0)
MCH: 24.4 pg — ABNORMAL LOW (ref 26.0–34.0)
MCHC: 29.6 g/dL — ABNORMAL LOW (ref 30.0–36.0)
MCV: 82.4 fL (ref 80.0–100.0)
Platelets: 230 10*3/uL (ref 150–400)
RBC: 3.24 MIL/uL — ABNORMAL LOW (ref 4.22–5.81)
RDW: 18.8 % — ABNORMAL HIGH (ref 11.5–15.5)
WBC: 9.4 10*3/uL (ref 4.0–10.5)
nRBC: 0 % (ref 0.0–0.2)

## 2019-02-13 MED ORDER — CYCLOBENZAPRINE HCL 5 MG PO TABS
5.0000 mg | ORAL_TABLET | Freq: Three times a day (TID) | ORAL | Status: DC | PRN
  Administered 2019-02-13: 5 mg via ORAL
  Filled 2019-02-13 (×2): qty 1

## 2019-02-13 MED ORDER — CIPROFLOXACIN HCL 500 MG PO TABS
500.0000 mg | ORAL_TABLET | Freq: Two times a day (BID) | ORAL | Status: DC
  Administered 2019-02-13 – 2019-02-15 (×4): 500 mg via ORAL
  Filled 2019-02-13 (×4): qty 1

## 2019-02-13 NOTE — Progress Notes (Signed)
Occupational Therapy Treatment Patient Details Name: Dylan Gonzalez MRN: [id number] DOB: 01/31/1966 Today's Date: 02/13/2019    History of present illness Pt is a 53 y.o. 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 He is currently receiving hospice services in the home. He presented to ED 6/5 with pubic abdominal pain. Found to have AKI and UTI treated with IV antibiotics patient chose to dc and he came back (6/6) because of bleeding around the Foley site, low grade fevers, bladder pressure and discomfort.   OT comments  Pt having urgency with BM and going back and forth to bathroom.   Moved BSC beside bed for pt as felt this was safer with IV pole and foley bag. CNA aware   Follow Up Recommendations  No OT follow up;Supervision - Intermittent    Equipment Recommendations  None recommended by OT(Pt has appropriate DME)    Recommendations for Other Services      Precautions / Restrictions Precautions Precautions: Fall Precaution Comments: Pt with severe bladder spasms in sitting       Mobility Bed Mobility               General bed mobility comments: pt OOB  Transfers Overall transfer level: Needs assistance   Transfers: Sit to/from Stand;Stand Pivot Transfers Sit to Stand: Min guard Stand pivot transfers: Min guard       General transfer comment: with IV pole    Balance Overall balance assessment: Needs assistance Sitting-balance support: No upper extremity supported;Feet supported Sitting balance-Leahy Scale: Good     Standing balance support: No upper extremity supported Standing balance-Leahy Scale: Fair                             ADL either performed or assessed with clinical judgement   ADL Overall ADL's : Needs assistance/impaired                          Toilet Transfer: Ambulation;Min guard   Toileting- Clothing Manipulation and Hygiene: Minimal assistance;Sit to/from stand;Cueing for sequencing;Cueing for safety       Functional mobility during ADLs: Min guard;Cueing for safety;Cueing for sequencing(with IV pole)       Vision Patient Visual Report: No change from baseline            Cognition Arousal/Alertness: Awake/alert Behavior During Therapy: WFL for tasks assessed/performed Overall Cognitive Status: Within Functional Limits for tasks assessed                                                     Pertinent Vitals/ Pain       Pain Assessment: No/denies pain     Prior Functioning/Environment              Frequency  Min 2X/week        Progress Toward Goals  OT Goals(current goals can now be found in the care plan section)  Progress towards OT goals: Progressing toward goals     Plan Discharge plan remains appropriate       AM-PAC OT "6 Clicks" Daily Activity     Outcome Measure   Help from  another person eating meals?: None Help from another person taking care of personal grooming?: A Little Help from another person toileting, which includes using toliet, bedpan, or urinal?: A Little Help from another person bathing (including washing, rinsing, drying)?: A Little Help from another person to put on and taking off regular upper body clothing?: None Help from another person to put on and taking off regular lower body clothing?: A Lot 6 Click Score: 19    End of Session    OT Visit Diagnosis: Unsteadiness on feet (R26.81);Muscle weakness (generalized) (M62.81);Pain Pain - part of body: (abdomen and foley site)   Activity Tolerance Patient tolerated treatment well   Patient Left with call bell/phone within reach;Other (comment)(sitting on BSC. CNA aware)   Nurse Communication Mobility status        Time: 0102-7253 OT Time Calculation (min): 14  min  Charges: OT General Charges $OT Visit: 1 Visit OT Treatments $Self Care/Home Management : 8-22 mins  Kari Baars, [place] [phone] Office- [phone], Edwena Felty D 02/13/2019, 4:39 PM

## 2019-02-13 NOTE — Progress Notes (Addendum)
[facility]) Hospital Liaison RN note  This a related and non-covered GIP admission of 02/11/2019 with [facility]) diagnosis of prostate cancer per Dr. Eulas Post. Patient is now a DNR per goals of care conversation with Dr. Earnest Conroy yesterday. Pt recently had a ED visit on 6/5 for dehydration/UTI and d/c home on ABT on 6/6. Pt informed staff he needed nasal cannulas however upon arrival the visiting SN the spouse indicated pt was bleeding clots on the floor from his foley cath. SN further investigated and confirmed pt was bleeding. EMS was activated and pt transported to [facility] ED. Pt was admitted to the hospital for hematuria.  Spoke with patient on the phone this morning and received report from bedside RN [place]. Patient reports he is feeling better and the plan is for discharge tomorrow. He is appreciative of call and ongoing hospice support.  VS: T 98.8, BP 149/97, HR 84, RR 18, O2 Sat 95% on room air. I/O: 2002/1197  Abnormal Labs: Glucose 117, BUN 37, Creatinine 1.45, Calcium 8.2, RBC 3.24, Hgb 7.9, Hct 26.7, MCH 24.4, MCHC 29.6, RDW 18.8  SARS Coronavirus 2: negative 02/10/19  Medications: Continuous: NS @ 75 cc/hr, Cefepime 2 g Q 12 hours. PRN medications: Fentanyl 25 mcg x 1 at 2243 02/12/19, B & O suppository 16.2-60 mg x 1 at [place] 02/13/19, ocycodone 5-20 mg x 1 at 2111 02/12/19   Assessment/Plan:  53 y.o.malewith metastatic prostate cancer on hospice who presented with urinary tract infection. He has chronic indwelling urinary catheter and ureteral stents in place for urinary obstruction. Worsened bladder spasms are likely the result of his urinary tract infection and are expected to improve as bladder inflammation resolves with antibiotics. Hematuria is at baseline and catheter is currently draining well. Given significant bladder spasms, do not recommend larger foley with continuous bladder irrigation as this may worsen bladder  spasms.  Recommendations: 1. Urinary tract infection: Urine cx from 02/09/19 growing Pseudomonas putida. On cefepime.   2. Metastatic prostate cancer:On hospice. Continue symptomatic management.   3. Bladder spasms: Likely acutely worsened due to UTI. Continue home myrbetriq and add B&O suppositories.   4. Gross hematuria: Chronic issue in setting of metastatic prostate cancer with significant improvement today. Avoid larger foley with CBI at this time since foley is draining. Monitor hemoglobin and transfuse prn.  [facility]: spoke with wife on phone, updated and emotional support provided.  [facility]: team updated  [place]: pt is now DNR, please send [place] DNR home with patient at time of discharge.  [facility] will continue to follow while in-patient. Plan would be for pt to return home with Hospice upon discharge.  Please use [facility] [phone]) for ambulance transport if needed at time of discharge.   Please call with any hospice related questions/concerns.  Thank You.  Margaretmary Eddy, RN, BSN [facility] Liaison [phone]  [place] are listed on AMION under [place].

## 2019-02-13 NOTE — Progress Notes (Addendum)
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  Patient was seen in the ED yesterday for the same and noted to have UTI, received 1 dose of IV antibiotics and went home on Bactrim/Keflex.  He returns today with bleeding around the Foley site and low-grade fevers with worsening bladder pressure/spasms. Reports he saw Dr. Jeffie Pollock 10 days ago gave him some sample for medication to try to stop the bleeding it helped but now seemed to recurrent.  Patient was admitted for further treatment and care of gross hematuria with suprapubic pain and bladder spasm, sepsis secondary to UTI.  Urology and oncology have been consulted.  New events last 24 hours / Subjective: States that he had significant bladder spasms last night.  Does not feel back to baseline yet enough to go home.  Hemoglobin did trend down overnight to 7.9  Assessment & Plan:   Active Problems:   Hypertension   Primary leiomyosarcoma of intra-abdominal site [facility])   Malignant neoplasm metastatic to lung [facility])   CAD (coronary artery disease)   Acute UTI (urinary tract infection)   Anemia   Abdominal pain   Sepsis (HCC)   Chronic diastolic CHF (congestive heart failure) (HCC)   Hematuria   Dehydration   Gross hematuria/suprapubic pain with bladder spasms Secondary to UTI versus prostate cancer.  Appreciate urology evaluation and input.  CBI deferred as patient would require a larger bore Foley catheter and he may not tolerate. Continue antispasmodic/pain medications and antibiotics.    Sepsis secondary to UTI, present on  admission Blood cultures negative to date.  Urine culture was negative as well, also was obtained after being on antibiotics as an outpatient.  Continue Cefepime --> Cipro   Prostate leiomyosarcoma with metastasis to lung CT scan reported slightly enlarging lung lesion.  Patient is status post TURP 04/2018 and status post 6 cycles of chemotherapy.  He is currently on home hospice, follows Dr. Alen Blew   Acute on CKD Stage II Likely secondary to obstructive uropathy from prostate cancer/hematuria.  Baseline creatinine appears to be around 1.5 over the last year.  His creatinine was elevated at 2.4 admission and now back to his baseline level.  Creatinine is 1.45 today  Hypertension/CAD/history of CVA Not a candidate for antiplatelet agents in the setting of gross hematuria.    Continue Lopressor  Chronic pain secondary to malignancy Continue pain medications  Chronic diastolic CHF Stable   DVT prophylaxis: Status post IVC filter, not a candidate for anticoagulants.  SCDs Code Status: DNR Family Communication: None Disposition Plan: Pending clinical improvement, hopefully discharge home with home hospice 6/10   Consultants:   Urology  Oncology  Procedures:   None  Antimicrobials:  Anti-infectives (From admission, onward)   Start     Dose/Rate Route Frequency Ordered Stop   02/11/19 2300  ceFEPIme (MAXIPIME) 2 g in sodium chloride 0.9 % 100 mL IVPB     2 g 200 mL/hr over 30 Minutes Intravenous Every 12 hours 02/11/19 1331     02/11/19 1400  ceFEPIme (MAXIPIME) 2 g in sodium chloride 0.9 % 100 mL IVPB     2 g  200 mL/hr over 30 Minutes Intravenous  Once 02/11/19 1328 02/11/19 1557   02/11/19 1100  cefTRIAXone (ROCEPHIN) 1 g in sodium chloride 0.9 % 100 mL IVPB  Status:  Discontinued     1 g 200 mL/hr over 30 Minutes Intravenous Every 24 hours 02/11/19 1052 02/11/19 1333   02/10/19 1745  cefTRIAXone (ROCEPHIN) 1 g in sodium chloride 0.9 % 100 mL IVPB  Status:  Discontinued      1 g 200 mL/hr over 30 Minutes Intravenous Every 24 hours 02/10/19 1744 02/11/19 0648       Objective: Vitals:   02/12/19 2330 02/13/19 0030 02/13/19 0452 02/13/19 1001  BP: (!) 157/108 (!) 144/90 (!) 149/97 (!) 157/108  Pulse: (!) 101 97 84 91  Resp: 20  18   Temp: 100.1 F (37.8 C)  98.8 F (37.1 C)   TempSrc: Oral  Oral   SpO2: 96%  95%   Weight:      Height:        Intake/Output Summary (Last 24 hours) at 02/13/2019 1214 Last data filed at 02/13/2019 0903 Gross per 24 hour  Intake 2506.69 ml  Output 2275 ml  Net 231.69 ml   Filed Weights   02/11/19 1213  Weight: 84.7 kg    Examination:  General exam: Appears calm and comfortable  Respiratory system: Clear to auscultation. Respiratory effort normal. Cardiovascular system: S1 & S2 heard, RRR. No JVD, murmurs, rubs, gallops or clicks. No pedal edema. Gastrointestinal system: Abdomen is nondistended, soft and nontender. No organomegaly or masses felt. Normal bowel sounds heard. Central nervous system: Alert. No new focal neurological deficits. Extremities: Symmetric 5 x 5 power. Skin: No rashes, lesions or ulcers Psychiatry: Judgement and insight appear stable  Data Reviewed: I have personally reviewed following labs and imaging studies  CBC: Recent Labs  Lab 02/09/19 2053 02/10/19 1749 02/11/19 0704  02/11/19 1552 02/12/19 0703 02/12/19 0954 02/12/19 2205 02/13/19 0448  WBC 12.0* 10.9* 13.0*  --   --   --  9.2  --  9.4  NEUTROABS 9.3* 8.5*  --   --   --   --   --   --   --   HGB 7.3* 7.9* 7.7*   < > 7.1* 6.6* 6.5* 8.6* 7.9*  HCT 25.0* 27.0* 25.8*   < > 24.3* 21.7* 21.8* 27.8* 26.7*  MCV 80.4 81.1 81.6  --   --   --  81.0  --  82.4  PLT 342 348 308  --   --   --  246  --  230   < > = values in this interval not displayed.   Basic Metabolic Panel: Recent Labs  Lab 02/09/19 2053 02/10/19 1749 02/11/19 0704 02/12/19 0703 02/12/19 0954 02/13/19 0448  NA 129* 142 138  --  139 140  K 3.5 3.8 3.6  --   3.3* 3.5  CL 93* 105 102  --  105 107  CO2 27 24 25   --  26 26  GLUCOSE 106* 126* 121*  --  129* 117*  BUN 47* 44* 48*  --  42* 37*  CREATININE 2.35* 1.90* 2.46* 2.18* 2.02* 1.45*  CALCIUM 8.0* 8.7* 8.5*  --  8.0* 8.2*  MG  --   --  2.1  --   --   --   PHOS  --   --  4.8*  --   --   --    GFR: Estimated Creatinine Clearance: 63.5 mL/min (A) (by C-G formula  based on SCr of 1.45 mg/dL (H)). Liver Function Tests: Recent Labs  Lab 02/09/19 2053 02/11/19 0704  AST 17 17  ALT 11 12  ALKPHOS 58 65  BILITOT 0.6 0.2*  PROT 8.1 7.8  ALBUMIN 2.9* 2.7*   Recent Labs  Lab 02/09/19 2053  LIPASE 18   No results for input(s): AMMONIA in the last 168 hours. Coagulation Profile: No results for input(s): INR, PROTIME in the last 168 hours. Cardiac Enzymes: No results for input(s): CKTOTAL, CKMB, CKMBINDEX, TROPONINI in the last 168 hours. BNP (last 3 results) No results for input(s): PROBNP in the last 8760 hours. HbA1C: No results for input(s): HGBA1C in the last 72 hours. CBG: No results for input(s): GLUCAP in the last 168 hours. Lipid Profile: No results for input(s): CHOL, HDL, LDLCALC, TRIG, CHOLHDL, LDLDIRECT in the last 72 hours. Thyroid Function Tests: Recent Labs    02/11/19 0704  TSH 4.253   Anemia Panel: No results for input(s): VITAMINB12, FOLATE, FERRITIN, TIBC, IRON, RETICCTPCT in the last 72 hours. Sepsis Labs: Recent Labs  Lab 02/10/19 1749 02/11/19 1313 02/11/19 1552  PROCALCITON  --  0.71  --   LATICACIDVEN 1.0 0.9 0.6    Recent Results (from the past 240 hour(s))  Urine culture     Status: Abnormal   Collection Time: 02/09/19 10:05 PM  Result Value Ref Range Status   Specimen Description   Final    URINE, RANDOM Performed at [facility] [address] [place], [place] [address]    Special Requests   Final    NONE Performed at [facility], [place] [address], [place], [place] [address]    Culture >=100,000  COLONIES/mL PSEUDOMONAS PUTIDA (A)  Final   Report Status 02/12/2019 FINAL  Final   Organism ID, Bacteria PSEUDOMONAS PUTIDA (A)  Final      Susceptibility   Pseudomonas putida - MIC*    CEFTAZIDIME 4 SENSITIVE Sensitive     CIPROFLOXACIN <=0.25 SENSITIVE Sensitive     GENTAMICIN <=1 SENSITIVE Sensitive     IMIPENEM 1 SENSITIVE Sensitive     PIP/TAZO 32 INTERMEDIATE Intermediate     CEFEPIME 2 SENSITIVE Sensitive     * >=100,000 COLONIES/mL PSEUDOMONAS PUTIDA  Urine Culture     Status: Abnormal   Collection Time: 02/10/19  4:55 PM  Result Value Ref Range Status   Specimen Description   Final    URINE, RANDOM Performed at [place] [address] [place], [place] [address]    Special Requests   Final    NONE Performed at [facility], [place] [address], [place], [place] [address]    Culture (A)  Final    <10,000 COLONIES/mL INSIGNIFICANT GROWTH Performed at [place] [address], [place],  [address]    Report Status 02/12/2019 FINAL  Final  SARS Coronavirus 2 (CEPHEID - Performed in [place] hospital lab), Hosp Order     Status: None   Collection Time: 02/10/19  5:49 PM  Result Value Ref Range Status   SARS Coronavirus 2 NEGATIVE NEGATIVE Final    Comment: (NOTE) If result is NEGATIVE SARS-CoV-2 target nucleic acids are NOT DETECTED. The SARS-CoV-2 RNA is generally detectable in upper and lower  respiratory specimens during the acute phase of infection. The lowest  concentration of SARS-CoV-2 viral copies this assay can detect is 250  copies / mL. A negative result does not preclude SARS-CoV-2 infection  and should not  be used as the sole basis for treatment or other  patient management decisions.  A negative result may occur with  improper specimen collection / handling, submission of specimen other  than nasopharyngeal swab, presence of viral mutation(s) within the  areas targeted by this assay, and inadequate  number of viral copies  (<250 copies / mL). A negative result must be combined with clinical  observations, patient history, and epidemiological information. If result is POSITIVE SARS-CoV-2 target nucleic acids are DETECTED. The SARS-CoV-2 RNA is generally detectable in upper and lower  respiratory specimens dur ing the acute phase of infection.  Positive  results are indicative of active infection with SARS-CoV-2.  Clinical  correlation with patient history and other diagnostic information is  necessary to determine patient infection status.  Positive results do  not rule out bacterial infection or co-infection with other viruses. If result is PRESUMPTIVE POSTIVE SARS-CoV-2 nucleic acids MAY BE PRESENT.   A presumptive positive result was obtained on the submitted specimen  and confirmed on repeat testing.  While 2019 novel coronavirus  (SARS-CoV-2) nucleic acids may be present in the submitted sample  additional confirmatory testing may be necessary for epidemiological  and / or clinical management purposes  to differentiate between  SARS-CoV-2 and other Sarbecovirus currently known to infect humans.  If clinically indicated additional testing with an alternate test  methodology [phone]) is advised. The SARS-CoV-2 RNA is generally  detectable in upper and lower respiratory sp ecimens during the acute  phase of infection. The expected result is Negative. Fact Sheet for Patients:  StrictlyIdeas.no Fact Sheet for Healthcare Providers: BankingDealers.co.za This test is not yet approved or cleared by the [place] FDA and has been authorized for detection and/or diagnosis of SARS-CoV-2 by FDA under an Emergency Use Authorization (EUA).  This EUA will remain in effect (meaning this test can be used) for the duration of the COVID-19 declaration under Section 564(b)(1) of the Act, 21 U.S.C. section 360bbb-3(b)(1), unless the  authorization is terminated or revoked sooner. Performed at [facility], [place] [address], [place], [place] [address]   Culture, blood (Routine X 2) w Reflex to ID Panel     Status: None (Preliminary result)   Collection Time: 02/10/19  6:07 PM  Result Value Ref Range Status   Specimen Description   Final    RIGHT ANTECUBITAL Performed at [place] [facility] [address] [place], [place] [address]    Special Requests   Final    BOTTLES DRAWN AEROBIC AND ANAEROBIC Blood Culture adequate volume Performed at [place] [address], [place], [place] [address]    Culture   Final    NO GROWTH 3 DAYS Performed at [facility], [place] [address], [place], [place] [address]    Report Status PENDING  Incomplete  Culture, blood (Routine X 2) w Reflex to ID Panel     Status: None (Preliminary result)   Collection Time: 02/10/19  6:08 PM  Result Value Ref Range Status   Specimen Description   Final    BLOOD RIGHT HAND Performed at [place] [address] [place], [place] [address]    Special Requests   Final    BOTTLES DRAWN AEROBIC AND ANAEROBIC Blood Culture results may not be optimal due to an inadequate volume of blood received in culture bottles Performed at [place] [address], [place],  [address]    Culture   Final  NO GROWTH 3 DAYS Performed at [facility], [place] [address], [place], [place] [address]    Report Status PENDING  Incomplete       Radiology Studies: No results found.    Scheduled Meds: . escitalopram  10 mg Oral Daily  . feeding supplement (ENSURE ENLIVE)  237 mL Oral TID BM  . gabapentin  300 mg Oral TID  . metoprolol tartrate  25 mg Oral BID  . mirabegron ER  25 mg Oral Daily  . morphine  30 mg Oral Q8H   Continuous Infusions: . ceFEPime (MAXIPIME) IV 2 g (02/13/19 1005)     LOS: 2 days    Time spent: 35 minutes    Dessa Phi, DO [facility] [email or web address] 02/13/2019, 12:14 PM

## 2019-02-13 NOTE — Progress Notes (Signed)
Pt urine appears more bloody than this morning with increased clots. MD notified, order placed for H&H. Will continue to monitor.

## 2019-02-14 LAB — BASIC METABOLIC PANEL
Anion gap: 8 (ref 5–15)
BUN: 37 mg/dL — ABNORMAL HIGH (ref 6–20)
CO2: 27 mmol/L (ref 22–32)
Calcium: 8.3 mg/dL — ABNORMAL LOW (ref 8.9–10.3)
Chloride: 105 mmol/L (ref 98–111)
Creatinine, Ser: 1.53 mg/dL — ABNORMAL HIGH (ref 0.61–1.24)
GFR calc Af Amer: 60 mL/min — ABNORMAL LOW (ref >60)
GFR calc non Af Amer: 52 mL/min — ABNORMAL LOW (ref >60)
Glucose, Bld: 107 mg/dL — ABNORMAL HIGH (ref 70–99)
Potassium: 3.6 mmol/L (ref 3.5–5.1)
Sodium: 140 mmol/L (ref 135–145)

## 2019-02-14 LAB — CBC
HCT: 26.6 % — ABNORMAL LOW (ref 39.0–52.0)
Hemoglobin: 7.9 g/dL — ABNORMAL LOW (ref 13.0–17.0)
MCH: 24.5 pg — ABNORMAL LOW (ref 26.0–34.0)
MCHC: 29.7 g/dL — ABNORMAL LOW (ref 30.0–36.0)
MCV: 82.6 fL (ref 80.0–100.0)
Platelets: 237 10*3/uL (ref 150–400)
RBC: 3.22 MIL/uL — ABNORMAL LOW (ref 4.22–5.81)
RDW: 19.1 % — ABNORMAL HIGH (ref 11.5–15.5)
WBC: 9.7 10*3/uL (ref 4.0–10.5)
nRBC: 0 % (ref 0.0–0.2)

## 2019-02-14 NOTE — Progress Notes (Signed)
[facility] Hospital Liaison RN note  This a related and non-covered GIP admission of 02/11/2019 with [facility]) diagnosis of prostate cancer per Dr. Eulas Post. Patient is now a DNR per goals of care conversation with Dr. Earnest Conroy yesterday. Pt recently had a ED visit on 6/5 for dehydration/UTI and d/c home on ABT on 6/6. Pt informed staff he needed nasal cannulas however upon arrival the visiting SN the spouse indicated pt was bleeding clots on the floor from his foley cath. SN further investigated and confirmed pt was bleeding. EMS was activated and pt transported to [facility] ED. Pt was admitted to the hospital for hematuria.  Spoke with patient by phone and received report from bedside RN, Nira Conn. Patient reports feeling much better today after having some severe bladder spasms the night before. Originally the plan was for patient to discharge today but due to concern of bladder spasms returning, this is postponed for one day.  RN reported he has not had any additional complaints of spasms today. He is continuing to receive Myrbetriq and B&O suppositories.   VS: 98.6, 129/83, 91, 16, 92%RA I/O: 1510/1799  Labs: Glucose 107, BUN 37, Creatinine 1.53, Calcium 8.3, GFR 60, RBC 3.22, Hgb 7.9, HCT 26.6, MCH 24.5, MCHC 29.7, RDW 19.1  Medications: continues oral antibiotics - Cipro 500mg  BID, ativan 0.5mg  po prn @ 1105, oxycodone 5-20mg  q4hrs po prn @1029    Per MD notes: Gross hematuria/suprapubic pain with bladder spasms Secondary to UTI versus prostate cancer. Appreciate urology evaluation and input. CBI deferred as patient would require a larger bore Foley catheter and he may not tolerate. Continue antispasmodic/pain medications and antibiotics.    Sepsis secondary to UTI, present on admission Blood cultures negative to date.  Urine culture was negative as well, also was obtained after being on antibiotics as an outpatient.  Continue Cefepime --> Cipro    Prostate leiomyosarcoma with metastasis to lung CT scan reported slightly enlarging lung lesion. Patient is status post TURP 04/2018 and status post 6 cycles of chemotherapy. He is currently on home hospice, follows Dr. Alen Blew   Acute on CKD Stage II Likely secondary to obstructive uropathy from prostate cancer/hematuria. Baseline creatinine appears to be around 1.5 over the last year. His creatinine was elevated at 2.4 admission and now back to his baseline level.  Creatinine is 1.45 today  PCG: spoke with wife on phone, updated and emotional support provided.  IDG: team updated  [place]: pt is now DNR, please send [place] DNR home with patient at time of discharge.  [facility] will continue to follow while in-patient. Plan would be for pt to return home with Hospice upon discharge.  Please use [facility] ([phone]) for ambulance transport if needed at time of discharge.   Please call with any hospice related questions/concerns.  Thank You.  Farrel Gordon, RN, [facility] Liaison [phone]  [place] are listed on AMION under [facility].

## 2019-02-14 NOTE — Progress Notes (Signed)
Physical Therapy Treatment Patient Details Name: TERRYN REDNER MRN: [id number] DOB: Nov 16, 1965 Today's Date: 02/14/2019    History of Present Illness Pt is a 53 y.o. 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 He is currently receiving hospice services in the home. He presented to ED 6/5 with pubic abdominal pain. Found to have AKI and UTI treated with IV antibiotics patient chose to dc and he came back (6/6) because of bleeding around the Foley site, low grade fevers, bladder pressure and discomfort.    PT Comments    Progressing with mobility. Plan is for d/c home possibly tomorrow    Follow Up Recommendations  Supervision for mobility/OOB     Equipment Recommendations  None recommended by PT    Recommendations for Other Services       Precautions / Restrictions Precautions Precautions: Fall Restrictions Weight Bearing Restrictions: No    Mobility  Bed Mobility   Bed Mobility: Supine to Sit              Transfers Overall transfer level: Needs assistance   Transfers: Sit to/from Stand Sit to Stand: Min guard         General transfer comment: mildly unsteady initially  Ambulation/Gait Ambulation/Gait assistance: Min guard Gait Distance (Feet): 400 Feet Assistive device: IV Pole Gait Pattern/deviations: Step-through pattern;Decreased stride length     General Gait Details: close guard for safety. slow gait speed. mily instability.   Stairs             Wheelchair Mobility    Modified Rankin (Stroke Patients Only)       Balance Overall balance assessment: Needs assistance           Standing balance-Leahy Scale: Fair                              Cognition Arousal/Alertness: Awake/alert Behavior During Therapy: WFL for tasks  assessed/performed Overall Cognitive Status: Within Functional Limits for tasks assessed                                        Exercises      General Comments        Pertinent Vitals/Pain Pain Assessment: Faces Faces Pain Scale: Hurts little more Pain Location: lower abdomen Pain Descriptors / Indicators: Sore Pain Intervention(s): Monitored during session    Home Living                      Prior Function            PT Goals (current goals can now be found in the care plan section) Progress towards PT goals: Progressing toward goals    Frequency    Min 3X/week      PT Plan Current plan remains appropriate    Co-evaluation              AM-PAC PT "6 Clicks" Mobility   Outcome Measure  Help needed turning from your back to your side while in a flat bed without using bedrails?: A Little Help needed moving from lying on your back to sitting on the side of a flat bed without using bedrails?: A Little Help needed moving to and from a  bed to a chair (including a wheelchair)?: A Little Help needed standing up from a chair using your arms (e.g., wheelchair or bedside chair)?: A Little Help needed to walk in hospital room?: A Little Help needed climbing 3-5 steps with a railing? : A Little 6 Click Score: 18    End of Session   Activity Tolerance: Patient tolerated treatment well Patient left: in bed;with call bell/phone within reach   PT Visit Diagnosis: Difficulty in walking, not elsewhere classified (R26.2);Muscle weakness (generalized) (M62.81)     Time: 6314-9702 PT Time Calculation (min) (ACUTE ONLY): 22 min  Charges:  $Gait Training: 8-22 mins                        Weston Anna, PT Acute Rehabilitation Services Pager: [phone] Office: [phone]

## 2019-02-14 NOTE — Consult Note (Signed)
   [facility] Inpatient Consult   02/14/2019  Dylan Gonzalez [birth year] [id number]   Patient chart has been reviewed as member of [employer] under Medicare health plan. Patient has extreme readmission risk score of 45%.  Per chart review, current disposition plan is for home hospice.  No THN needs.  Netta Cedars, MSN, [place] [facility] Liaison Nurse Mobile Phone [phone]  Toll free office [phone]

## 2019-02-14 NOTE — Progress Notes (Signed)
Occupational Therapy Treatment Patient Details Name: Dylan Gonzalez MRN: [id number] DOB: August 07, 1966 Today's Date: 02/14/2019    History of present illness Pt is a 53 y.o. 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 He is currently receiving hospice services in the home. He presented to ED 6/5 with pubic abdominal pain. Found to have AKI and UTI treated with IV antibiotics patient chose to dc and he came back (6/6) because of bleeding around the Foley site, low grade fevers, bladder pressure and discomfort.   OT comments  Pt very agreeable to working with OT this day. States bladder spasms terrible last night but better this day  Follow Up Recommendations  No OT follow up;Supervision - Intermittent    Equipment Recommendations  None recommended by OT(Pt has appropriate DME)    Recommendations for Other Services      Precautions / Restrictions Precautions Precautions: Fall Restrictions Weight Bearing Restrictions: No       Mobility Bed Mobility   Bed Mobility: Supine to Sit     Supine to sit: Supervision        Transfers Overall transfer level: Needs assistance   Transfers: Sit to/from Stand;Stand Pivot Transfers Sit to Stand: Min guard Stand pivot transfers: Min guard       General transfer comment: No IV pole or walker    Balance Overall balance assessment: Needs assistance           Standing balance-Leahy Scale: Fair                             ADL either performed or assessed with clinical judgement   ADL Overall ADL's : Needs assistance/impaired     Grooming: Min guard;Standing;Wash/dry face;Oral care                   Toilet Transfer: Ambulation;Min guard;Comfort height toilet   Toileting- Clothing Manipulation and Hygiene: Sit to/from  stand;Cueing for sequencing;Cueing for safety;Min guard       Functional mobility during ADLs: Min guard;Cueing for safety;Cueing for sequencing       Vision Patient Visual Report: No change from baseline     Perception     Praxis      Cognition Arousal/Alertness: Awake/alert Behavior During Therapy: WFL for tasks assessed/performed Overall Cognitive Status: Within Functional Limits for tasks assessed                                                     Pertinent Vitals/ Pain       Pain Assessment: Faces Pain Score: 2  Faces Pain Scale: Hurts little more Pain Location: lower abdomen Pain Descriptors / Indicators: Discomfort Pain Intervention(s): Monitored during session  Home Living                                              Frequency  Min 2X/week        Progress Toward Goals  OT Goals(current goals can now be found in the care plan section)  Progress towards OT goals: Progressing toward goals  Plan Discharge plan remains appropriate    Co-evaluation                 AM-PAC OT "6 Clicks" Daily Activity     Outcome Measure   Help from another person eating meals?: None Help from another person taking care of personal grooming?: A Little Help from another person toileting, which includes using toliet, bedpan, or urinal?: A Little Help from another person bathing (including washing, rinsing, drying)?: A Little Help from another person to put on and taking off regular upper body clothing?: None Help from another person to put on and taking off regular lower body clothing?: A Lot 6 Click Score: 19    End of Session    OT Visit Diagnosis: Unsteadiness on feet (R26.81);Muscle weakness (generalized) (M62.81) Pain - part of body: (abdomen and foley site)   Activity Tolerance Patient tolerated treatment well   Patient Left with call bell/phone within reach;in chair(sitting on BSC. CNA aware)   Nurse  Communication Mobility status        Time: 0076-2263 OT Time Calculation (min): 15 min  Charges: OT General Charges $OT Visit: 1 Visit OT Treatments $Self Care/Home Management : 8-22 mins  Kari Baars, [place] [phone] Office- [phone], Edwena Felty D 02/14/2019, 4:41 PM

## 2019-02-14 NOTE — Plan of Care (Signed)
Patient medicated for pain in back x 1 this shift, no complaints of bladder spasm during 7 a to 7 p shift.  Up with PT and sat in chair.  Urine remains blood tinged but according to patient much lighter in appearance.  Tolerating regular diet.

## 2019-02-14 NOTE — Progress Notes (Signed)
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  Patient was seen in the ED yesterday for the same and noted to have UTI, received 1 dose of IV antibiotics and went home on Bactrim/Keflex.  He returns today with bleeding around the Foley site and low-grade fevers with worsening bladder pressure/spasms. Reports he saw Dr. Jeffie Pollock 10 days ago gave him some sample for medication to try to stop the bleeding it helped but now seemed to recurrent.  Patient was admitted for further treatment and care of gross hematuria with suprapubic pain and bladder spasm, sepsis secondary to UTI.  Urology and oncology have been consulted.  New events last 24 hours / Subjective: Patient reported having a rough night, still with significant bladder spasm with hematuria.  Denies any new complaints  Assessment & Plan:   Active Problems:   Hypertension   Primary leiomyosarcoma of intra-abdominal site [facility])   Malignant neoplasm metastatic to lung [facility])   CAD (coronary artery disease)   Acute UTI (urinary tract infection)   Anemia   Abdominal pain   Sepsis (HCC)   Chronic diastolic CHF (congestive heart failure) (HCC)   Hematuria   Dehydration   Gross hematuria/suprapubic pain with bladder spasms Secondary to UTI versus prostate cancer Appreciate urology evaluation and input: CBI deferred as patient would require a larger bore Foley catheter and he may not tolerate Continue antispasmodic/pain medications and antibiotics.    Sepsis secondary to UTI, present on admission Blood cultures negative to date.   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  He is currently on home hospice, follows Dr. Alen Blew   Acute on CKD Stage II Stable Likely secondary to obstructive uropathy from prostate cancer/hematuria Baseline creatinine appears to be around 1.5 over the last year.  His creatinine was elevated at 2.4 admission and now back to his baseline level  Hypertension/CAD/history of CVA Not a candidate for antiplatelet agents in the setting of gross hematuria.    Continue Lopressor  Chronic pain secondary to malignancy Continue pain medications  Chronic diastolic CHF Stable   DVT prophylaxis: Status post IVC filter, not a candidate for anticoagulants.  SCDs Code Status: DNR Family Communication: None at bedside Disposition Plan: Pending clinical improvement, hopefully discharge home with home hospice 6/11   Consultants:   Urology  Oncology  Procedures:   None  Antimicrobials:  Anti-infectives (From admission, onward)   Start     Dose/Rate Route Frequency Ordered Stop   02/13/19 1400  ciprofloxacin (CIPRO) tablet 500 mg     500 mg Oral 2 times daily 02/13/19 1222     02/11/19 2300  ceFEPIme (MAXIPIME) 2 g in sodium chloride 0.9 % 100 mL IVPB  Status:  Discontinued     2 g 200 mL/hr over 30 Minutes Intravenous Every 12 hours 02/11/19 1331 02/13/19 1222   02/11/19 1400  ceFEPIme (MAXIPIME) 2 g in sodium chloride 0.9 % 100  mL IVPB     2 g 200 mL/hr over 30 Minutes Intravenous  Once 02/11/19 1328 02/11/19 1557   02/11/19 1100  cefTRIAXone (ROCEPHIN) 1 g in sodium chloride 0.9 % 100 mL IVPB  Status:  Discontinued     1 g 200 mL/hr over 30 Minutes Intravenous Every 24 hours 02/11/19 1052 02/11/19 1333   02/10/19 1745  cefTRIAXone (ROCEPHIN)  1 g in sodium chloride 0.9 % 100 mL IVPB  Status:  Discontinued     1 g 200 mL/hr over 30 Minutes Intravenous Every 24 hours 02/10/19 1744 02/11/19 0648       Objective: Vitals:   02/13/19 2343 02/14/19 0036 02/14/19 0544 02/14/19 1400  BP:  122/90 129/83 (!) 128/92  Pulse:  (!) 104 91 98  Resp:  16 16 16   Temp:  99 F (37.2 C) 98.6 F (37 C) 98.2 F (36.8 C)  TempSrc:  Oral Oral Oral  SpO2: 92% 94% 92% 93%  Weight:      Height:        Intake/Output Summary (Last 24 hours) at 02/14/2019 1937 Last data filed at 02/14/2019 1835 Gross per 24 hour  Intake 1020 ml  Output 2950 ml  Net -1930 ml   Filed Weights   02/11/19 1213  Weight: 84.7 kg    Examination:   General: NAD   Cardiovascular: S1, S2 present  Respiratory: CTAB  Abdomen: Soft, nontender, nondistended, bowel sounds present  Musculoskeletal: No bilateral pedal edema noted  Skin: Normal  Psychiatry: Normal mood   Data Reviewed: I have personally reviewed following labs and imaging studies  CBC: Recent Labs  Lab 02/09/19 2053 02/10/19 1749 02/11/19 0704  02/12/19 0954 02/12/19 2205 02/13/19 0448 02/13/19 1702 02/14/19 0518  WBC 12.0* 10.9* 13.0*  --  9.2  --  9.4  --  9.7  NEUTROABS 9.3* 8.5*  --   --   --   --   --   --   --   HGB 7.3* 7.9* 7.7*   < > 6.5* 8.6* 7.9* 8.5* 7.9*  HCT 25.0* 27.0* 25.8*   < > 21.8* 27.8* 26.7* 28.2* 26.6*  MCV 80.4 81.1 81.6  --  81.0  --  82.4  --  82.6  PLT 342 348 308  --  246  --  230  --  237   < > = values in this interval not displayed.   Basic Metabolic Panel: Recent Labs  Lab 02/10/19 1749 02/11/19 0704 02/12/19 0703 02/12/19 0954 02/13/19 0448 02/14/19 0518  NA 142 138  --  139 140 140  K 3.8 3.6  --  3.3* 3.5 3.6  CL 105 102  --  105 107 105  CO2 24 25  --  26 26 27   GLUCOSE 126* 121*  --  129* 117* 107*  BUN 44* 48*  --  42* 37* 37*  CREATININE 1.90* 2.46* 2.18* 2.02* 1.45* 1.53*  CALCIUM 8.7* 8.5*  --  8.0* 8.2* 8.3*  MG  --  2.1  --    --   --   --   PHOS  --  4.8*  --   --   --   --    GFR: Estimated Creatinine Clearance: 60.2 mL/min (A) (by C-G formula based on SCr of 1.53 mg/dL (H)). Liver Function Tests: Recent Labs  Lab 02/09/19 2053 02/11/19 0704  AST 17 17  ALT 11 12  ALKPHOS 58 65  BILITOT 0.6 0.2*  PROT 8.1 7.8  ALBUMIN 2.9* 2.7*   Recent Labs  Lab 02/09/19 2053  LIPASE 18   No results for input(s): AMMONIA in the last 168 hours. Coagulation Profile: No results for input(s): INR, PROTIME in the last 168 hours. Cardiac Enzymes: No results for input(s): CKTOTAL, CKMB, CKMBINDEX, TROPONINI in the last 168 hours. BNP (last 3 results) No results for input(s): PROBNP in the last 8760 hours. HbA1C: No results for input(s): HGBA1C in the last 72 hours. CBG: No results for input(s): GLUCAP in the last 168 hours. Lipid Profile: No results for input(s): CHOL, HDL, LDLCALC, TRIG, CHOLHDL, LDLDIRECT in the last 72 hours. Thyroid Function Tests: No results for input(s): TSH, T4TOTAL, FREET4, T3FREE, THYROIDAB in the last 72 hours. Anemia Panel: No results for input(s): VITAMINB12, FOLATE, FERRITIN, TIBC, IRON, RETICCTPCT in the last 72 hours. Sepsis Labs: Recent Labs  Lab 02/10/19 1749 02/11/19 1313 02/11/19 1552  PROCALCITON  --  0.71  --   LATICACIDVEN 1.0 0.9 0.6    Recent Results (from the past 240 hour(s))  Urine culture     Status: Abnormal   Collection Time: 02/09/19 10:05 PM  Result Value Ref Range Status   Specimen Description   Final    URINE, RANDOM Performed at [facility] [address] [place], [place] [address]    Special Requests   Final    NONE Performed at [facility], [place] [address], [place], [place] [address]    Culture >=100,000 COLONIES/mL PSEUDOMONAS PUTIDA (A)  Final   Report Status 02/12/2019 FINAL  Final   Organism ID, Bacteria PSEUDOMONAS PUTIDA (A)  Final      Susceptibility   Pseudomonas putida - MIC*    CEFTAZIDIME  4 SENSITIVE Sensitive     CIPROFLOXACIN <=0.25 SENSITIVE Sensitive     GENTAMICIN <=1 SENSITIVE Sensitive     IMIPENEM 1 SENSITIVE Sensitive     PIP/TAZO 32 INTERMEDIATE Intermediate     CEFEPIME 2 SENSITIVE Sensitive     * >=100,000 COLONIES/mL PSEUDOMONAS PUTIDA  Urine Culture     Status: Abnormal   Collection Time: 02/10/19  4:55 PM  Result Value Ref Range Status   Specimen Description   Final    URINE, RANDOM Performed at [facility] [address], [place], [place] [address]    Special Requests   Final    NONE Performed at [facility], [place] [address] [place], [place] [address]    Culture (A)  Final    <10,000 COLONIES/mL INSIGNIFICANT GROWTH Performed at [place] [address] [place],  [address]    Report Status 02/12/2019 FINAL  Final  SARS Coronavirus 2 (CEPHEID - Performed in [place] hospital lab), Hosp Order     Status: None   Collection Time: 02/10/19  5:49 PM  Result Value Ref Range Status   SARS Coronavirus 2 NEGATIVE NEGATIVE Final    Comment: (NOTE) If result is NEGATIVE SARS-CoV-2 target nucleic acids are NOT DETECTED. The SARS-CoV-2 RNA is generally detectable in upper and lower  respiratory specimens during the acute phase of infection. The lowest  concentration of SARS-CoV-2 viral copies this assay can detect is 250  copies / mL. A negative result does not preclude SARS-CoV-2 infection  and should not be used as the sole basis for treatment or other  patient management decisions.  A negative result may occur with  improper specimen collection / handling, submission of specimen other  than nasopharyngeal swab, presence of  viral mutation(s) within the  areas targeted by this assay, and inadequate number of viral copies  (<250 copies / mL). A negative result must be combined with clinical  observations, patient history, and epidemiological information. If result is POSITIVE SARS-CoV-2 target  nucleic acids are DETECTED. The SARS-CoV-2 RNA is generally detectable in upper and lower  respiratory specimens dur ing the acute phase of infection.  Positive  results are indicative of active infection with SARS-CoV-2.  Clinical  correlation with patient history and other diagnostic information is  necessary to determine patient infection status.  Positive results do  not rule out bacterial infection or co-infection with other viruses. If result is PRESUMPTIVE POSTIVE SARS-CoV-2 nucleic acids MAY BE PRESENT.   A presumptive positive result was obtained on the submitted specimen  and confirmed on repeat testing.  While 2019 novel coronavirus  (SARS-CoV-2) nucleic acids may be present in the submitted sample  additional confirmatory testing may be necessary for epidemiological  and / or clinical management purposes  to differentiate between  SARS-CoV-2 and other Sarbecovirus currently known to infect humans.  If clinically indicated additional testing with an alternate test  methodology [phone]) is advised. The SARS-CoV-2 RNA is generally  detectable in upper and lower respiratory sp ecimens during the acute  phase of infection. The expected result is Negative. Fact Sheet for Patients:  StrictlyIdeas.no Fact Sheet for Healthcare Providers: BankingDealers.co.za This test is not yet approved or cleared by the [place] FDA and has been authorized for detection and/or diagnosis of SARS-CoV-2 by FDA under an Emergency Use Authorization (EUA).  This EUA will remain in effect (meaning this test can be used) for the duration of the COVID-19 declaration under Section 564(b)(1) of the Act, 21 U.S.C. section 360bbb-3(b)(1), unless the authorization is terminated or revoked sooner. Performed at [facility], [place] [address] [place], [place] [address]   Culture, blood (Routine X 2) w Reflex to ID Panel     Status: None  (Preliminary result)   Collection Time: 02/10/19  6:07 PM  Result Value Ref Range Status   Specimen Description   Final    RIGHT ANTECUBITAL Performed at [facility] [address], [place], [place] [address]    Special Requests   Final    BOTTLES DRAWN AEROBIC AND ANAEROBIC Blood Culture adequate volume Performed at [facility] [address] [place], [place] [address]    Culture   Final    NO GROWTH 4 DAYS Performed at [facility], [place] [address] [place], [place] [address]    Report Status PENDING  Incomplete  Culture, blood (Routine X 2) w Reflex to ID Panel     Status: None (Preliminary result)   Collection Time: 02/10/19  6:08 PM  Result Value Ref Range Status   Specimen Description   Final    BLOOD RIGHT HAND Performed at [facility] [address], [place], [place] [address]    Special Requests   Final    BOTTLES DRAWN AEROBIC AND ANAEROBIC Blood Culture results may not be optimal due to an inadequate volume of blood received in culture bottles Performed at [facility] [address], [place], [place] [address]    Culture   Final    NO GROWTH 4 DAYS Performed at [facility], [place] [address] [place],  [address]    Report Status PENDING  Incomplete       Radiology Studies: No  results found.    Scheduled Meds:  ciprofloxacin  500 mg Oral BID   escitalopram  10 mg Oral Daily   feeding supplement (ENSURE ENLIVE)  237 mL Oral TID BM   gabapentin  300 mg Oral TID   metoprolol tartrate  25 mg Oral BID   mirabegron ER  25 mg Oral Daily   morphine  30 mg Oral Q8H   Continuous Infusions:    LOS: 3 days    Time spent: 35 minutes   Alma Friendly, MD [facility] [email or web address] 02/14/2019, 7:37 PM

## 2019-02-15 LAB — CBC WITH DIFFERENTIAL/PLATELET
Abs Immature Granulocytes: 0.03 10*3/uL (ref 0.00–0.07)
Basophils Absolute: 0 10*3/uL (ref 0.0–0.1)
Basophils Relative: 1 %
Eosinophils Absolute: 0.6 10*3/uL — ABNORMAL HIGH (ref 0.0–0.5)
Eosinophils Relative: 7 %
HCT: 27.2 % — ABNORMAL LOW (ref 39.0–52.0)
Hemoglobin: 8.2 g/dL — ABNORMAL LOW (ref 13.0–17.0)
Immature Granulocytes: 0 %
Lymphocytes Relative: 16 %
Lymphs Abs: 1.4 10*3/uL (ref 0.7–4.0)
MCH: 25.3 pg — ABNORMAL LOW (ref 26.0–34.0)
MCHC: 30.1 g/dL (ref 30.0–36.0)
MCV: 84 fL (ref 80.0–100.0)
Monocytes Absolute: 1 10*3/uL (ref 0.1–1.0)
Monocytes Relative: 12 %
Neutro Abs: 5.4 10*3/uL (ref 1.7–7.7)
Neutrophils Relative %: 64 %
Platelets: 269 10*3/uL (ref 150–400)
RBC: 3.24 MIL/uL — ABNORMAL LOW (ref 4.22–5.81)
RDW: 20 % — ABNORMAL HIGH (ref 11.5–15.5)
WBC: 8.4 10*3/uL (ref 4.0–10.5)
nRBC: 0 % (ref 0.0–0.2)

## 2019-02-15 LAB — CULTURE, BLOOD (ROUTINE X 2)
Culture: NO GROWTH
Culture: NO GROWTH
Special Requests: ADEQUATE

## 2019-02-15 LAB — BASIC METABOLIC PANEL
Anion gap: 8 (ref 5–15)
BUN: 28 mg/dL — ABNORMAL HIGH (ref 6–20)
CO2: 30 mmol/L (ref 22–32)
Calcium: 8.5 mg/dL — ABNORMAL LOW (ref 8.9–10.3)
Chloride: 105 mmol/L (ref 98–111)
Creatinine, Ser: 1.06 mg/dL (ref 0.61–1.24)
GFR calc Af Amer: >60 mL/min (ref >60)
GFR calc non Af Amer: >60 mL/min (ref >60)
Glucose, Bld: 132 mg/dL — ABNORMAL HIGH (ref 70–99)
Potassium: 3.9 mmol/L (ref 3.5–5.1)
Sodium: 143 mmol/L (ref 135–145)

## 2019-02-15 MED ORDER — BELLADONNA ALKALOIDS-OPIUM 16.2-60 MG RE SUPP: 1.0000 | Freq: Three times a day (TID) | RECTAL | 0 refills | Status: DC | PRN

## 2019-02-15 MED ORDER — CIPROFLOXACIN HCL 500 MG PO TABS: 500.0000 mg | ORAL_TABLET | Freq: Two times a day (BID) | ORAL | 0 refills | Status: AC

## 2019-02-15 NOTE — TOC Progression Note (Signed)
Transition of Care [facility]) - Progression Note    Patient Details  Name: Dylan Gonzalez MRN: [id number] Date of Birth: [birth year]  Transition of Care [facility]) CM/SW Contact  Rex Magee, Juliann Pulse, RN Phone Number: 02/15/2019, 2:15 PM  Clinical Narrative: [facility] for transport.Patient transported by [facility], patient states his spouse is aware & will be home. No further CM needs.        Barriers to Discharge: No Barriers Identified  Expected Discharge Plan and Services           Expected Discharge Date: 02/15/19                         HH Arranged: RN [place] Agency: Hospice and [place] Date [place]: 02/15/19 Time [place]: 1300 Representative spoke with at [place]: [place] ([place]) Interventions    Readmission Risk Interventions Readmission Risk Prevention Plan 02/12/2019 08/05/2018  Transportation Screening Complete Complete  Medication Review Press photographer) Complete Complete  PCP or Specialist appointment within 3-5 days of discharge Not Complete Patient refused  PCP/Specialist Appt Not Complete comments not yet ready for d/c -  [place] or Home Care Consult Complete Complete  SW Recovery Care/Counseling Consult Complete -  Palliative Care Screening Complete -  [place] Not Applicable -  Some recent data might be hidden

## 2019-02-15 NOTE — Discharge Summary (Signed)
Discharge Summary  Dylan Gonzalez PQZ:[id number] DOB: 1966-08-19  PCP: Dylan Prose, MD  Admit date: 02/10/2019 Discharge date: 02/15/2019  Time spent: 35 mins  Recommendations for Outpatient Follow-up:  1. PCP 2. Urology with [facility]. Follow up appointment has been made   Discharge Diagnoses:  Active Hospital Problems   Diagnosis Date Noted   Chronic diastolic CHF (congestive heart failure) ([facility]) 02/10/2019   Hematuria 02/10/2019   Dehydration 02/10/2019   Abdominal pain 09/28/2018   Sepsis ([place]) 09/28/2018   Anemia 08/14/2018   Acute UTI (urinary tract infection) 08/02/2018   CAD (coronary artery disease) 08/01/2018   Malignant neoplasm metastatic to lung ([place]) 04/19/2018   Primary leiomyosarcoma of intra-abdominal site [facility]) 04/18/2018   Hypertension 12/26/2011    Resolved Hospital Problems  No resolved problems to display.    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 Patient was seen in the ED yesterday for the same and noted to have UTI, received 1 dose of IV antibiotics and went home on Bactrim/Keflex. He returns today with bleeding around the Foley site and low-grade fevers with worsening bladder pressure/spasms. Reports he saw Dr. Jeffie Gonzalez 10 days ago gave him some sample for medication to try to stop the bleeding it helped but now seemed to recurrent.  Patient was admitted for further treatment and care of gross hematuria with  suprapubic pain and bladder spasm, sepsis secondary to UTI.  Urology and oncology have been consulted.   Today, patient reported feeling much better, denies any worsening or new symptoms.  Hematuria significantly improved as well as bladder spasms.  Patient stable to be discharged with close follow-up with urology  Hospital Course:  Active Problems:   Hypertension   Primary leiomyosarcoma of intra-abdominal site [facility])   Malignant neoplasm metastatic to lung [facility])   CAD (coronary artery disease)   Acute UTI (urinary tract infection)   Anemia   Abdominal pain   Sepsis (H[facility])   Chronic diastolic CHF (congestive heart failure) ([facility])   Hematuria   Dehydration  Gross hematuria/suprapubic pain with bladder spasms Secondary to UTI versus prostate cancer Appreciate urology evaluation and input Continue antispasmodic/pain medications and antibiotics Follow up with Urology as an outpatient   Sepsis secondary to UTI, present on admission Blood cultures negative to date.  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 He is currently on home hospice, follows Dr. Alen Gonzalez   Acute on CKD Stage II Stable Likely secondary to obstructive uropathy from prostate cancer/hematuria Baseline creatinine appears to be around 1.5 over the last year. His creatinine was elevated at 2.4 admission and now back to his baseline level  Hypertension/CAD/history of CVA Not a candidate for antiplatelet agents in the setting of gross hematuria.  Continue Lopressor  Chronic pain secondary to malignancy Continue pain medications  Chronic diastolic CHF Stable        Malnutrition Type:  Nutrition Problem: Increased nutrient needs Etiology:  cancer and cancer related treatments   Malnutrition Characteristics:  Signs/Symptoms: estimated needs   Nutrition Interventions:  Interventions: Ensure Enlive (each supplement provides 350kcal and 20 grams of protein), Magic cup, Education   Estimated body mass index is 26.04 kg/m as calculated from the following:   Height as of this encounter: 5\' 11"  (1.803 m).   Weight as of this encounter: 84.7 kg.    Procedures:  None  Consultations:  Urology  Oncology  Discharge Exam: BP (!) 148/102 Comment: BP meds given    Pulse 91    Temp (!) 97.5 F (36.4 C) (Oral)    Resp 20    Ht 5\' 11"  (1.803 m)    Wt 84.7 kg    SpO2 96%    BMI 26.04 kg/m   General: NAD Cardiovascular: S1, S2 present Respiratory: CTAB  Discharge Instructions You were cared for by a hospitalist during your hospital stay. If you have any questions about your discharge medications or the care you received while you were in the hospital after you are discharged, you can call the unit and asked to speak with the hospitalist on call if the hospitalist that took care of you is not available. Once you are discharged, your primary care physician will handle any further medical issues. Please note that NO REFILLS for any discharge medications will be authorized once you are discharged, as it is imperative that you return to your primary care physician (or establish a relationship with a primary care physician if you do not have one) for your aftercare needs so that they can reassess your need for medications and monitor your lab values.   Allergies as of 02/15/2019      Reactions   Dilaudid [hydromorphone Hcl] Hives, Itching, Rash, Other (See Comments)   Causes un-controllable shakes   Pollen Extract Other (See Comments)   Sneezing and shortness of breath      Medication List    STOP taking these medications   cephALEXin 500 MG capsule Commonly known as: KEFLEX   hydrOXYzine 100 MG capsule Commonly known as:  VISTARIL   ondansetron 8 MG tablet Commonly known as: ZOFRAN     TAKE these medications   ciprofloxacin 500 MG tablet Commonly known as: CIPRO Take 1 tablet (500 mg total) by mouth 2 (two) times daily for 5 days.   diazepam 5 MG tablet Commonly known as: VALIUM Take 5 mg by mouth 3 (three) times daily.   escitalopram 10 MG tablet Commonly known as: LEXAPRO Take 10 mg by mouth daily.   fluticasone 50 MCG/ACT nasal spray Commonly known as: FLONASE PLACE 2 SPRAYS INTO BOTH NOSTRILS DAILY. What changed: See the new instructions.   gabapentin 300 MG capsule Commonly known as: NEURONTIN Take 300 mg by mouth 3 (three) times daily.   LORazepam 0.5 MG tablet Commonly known as: ATIVAN Take 0.5 mg by mouth every 4 (four) hours as needed for anxiety.   metoprolol tartrate 50 MG tablet Commonly known as: LOPRESSOR Take 50 mg by mouth daily.   morphine 30 MG 12 hr tablet Commonly known as: MS CONTIN Take 1 tablet (30 mg total) by mouth every 12 (twelve) hours. What changed: when to take this   Myrbetriq 25 MG Tb24 tablet Generic drug: mirabegron ER Take 25 mg by mouth daily.  opium-belladonna 16.2-60 MG suppository Commonly known as: B&O SUPPRETTES Place 1 suppository rectally every 8 (eight) hours as needed for up to 5 days for bladder spasms.   oxyCODONE 5 MG immediate release tablet Commonly known as: Oxy IR/ROXICODONE Take 5-20 mg by mouth See admin instructions. Every 4 hours as needed for breakthrough pain:  5mg  for mild pain 10 mg for moderate pain 20 mg for severe pain   polyethylene glycol 17 g packet Commonly known as: MIRALAX / GLYCOLAX Take 17 g by mouth 2 (two) times daily as needed for mild constipation or moderate constipation. What changed:   when to take this  Another medication with the same name was removed. Continue taking this medication, and follow the directions you see here.   Proventil HFA 108 (90 Base) MCG/ACT inhaler Generic drug:  albuterol Inhale 2 puffs into the lungs every 6 (six) hours as needed for wheezing or shortness of breath.   Albuterol Sulfate 2.5 MG/0.5ML Nebu Inhale 1 each into the lungs every 6 (six) hours as needed (shortness of breath/wheezing).   sennosides-docusate sodium 8.6-50 MG tablet Commonly known as: SENOKOT-S Take 1 tablet by mouth daily as needed for constipation.      Allergies  Allergen Reactions   Dilaudid [Hydromorphone Hcl] Hives, Itching, Rash and Other (See Comments)    Causes un-controllable shakes   Pollen Extract Other (See Comments)    Sneezing and shortness of breath   Follow-up Information    Dylan Prose, MD. Schedule an appointment as soon as possible for a visit in 1 week(s).   Specialty: Family Medicine Contact information: [place] [address] [phone]        [facility] Follow up.   Why: Verify your appointment as it has already been scheduled Contact information: [place] [phone]           The results of significant diagnostics from this hospitalization (including imaging, microbiology, ancillary and laboratory) are listed below for reference.    Significant Diagnostic Studies: Dg Chest 2 View  Result Date: 02/10/2019 CLINICAL DATA:  53 year old male with history of prostate cancer. EXAM: CHEST - 2 VIEW COMPARISON:  Chest x-ray 11/10/2018. FINDINGS: Right internal jugular single-lumen power porta cath with tip terminating at the superior cavoatrial junction. Lung volumes are low. No consolidative airspace disease. No pleural effusions. No pneumothorax. No definite suspicious appearing pulmonary nodules or masses. Pulmonary venous congestion, without frank pulmonary edema. Moderate cardiomegaly. Upper mediastinal contours are within normal limits. Aortic atherosclerosis. IMPRESSION: 1. Low lung volumes with cardiomegaly and pulmonary venous congestion,  but no frank pulmonary edema. 2. Aortic atherosclerosis. Electronically Signed   By: Vinnie Langton M.D.   On: 02/10/2019 20:13   Ct Head Wo Contrast  Result Date: 02/09/2019 CLINICAL DATA:  Weakness EXAM: CT HEAD WITHOUT CONTRAST TECHNIQUE: Contiguous axial images were obtained from the base of the skull through the vertex without intravenous contrast. COMPARISON:  CT head dated 02/02/2014. FINDINGS: Brain: No evidence of acute infarction, hemorrhage, hydrocephalus, extra-axial collection or mass lesion/mass effect. There is age related volume loss. There are advanced chronic microvascular ischemic changes bilaterally. Vascular: No hyperdense vessel or unexpected calcification. Skull: Normal. Negative for fracture or focal lesion. Sinuses/Orbits: No acute finding. Postsurgical changes are noted of the left orbit. Other: None. IMPRESSION: 1. No acute intracranial abnormality detected. 2. Chronic microvascular ischemic changes with age related volume loss, progressed from 32. Electronically Signed   By:  Constance Holster M.D.   On: 02/09/2019 23:35   Ct Renal Stone Study  Result Date: 02/09/2019 CLINICAL DATA:  Bladder and prostate cancer with increasing left lower quadrant abdominal pain. EXAM: CT ABDOMEN AND PELVIS WITHOUT CONTRAST TECHNIQUE: Multidetector CT imaging of the abdomen and pelvis was performed following the standard protocol without IV contrast. COMPARISON:  None. FINDINGS: Lower chest: There is a 1.7 cm pulmonary nodule in the left lower lobe. This has increased significantly in size from prior study. Heart size is significantly enlarged. There is a trace right-sided pleural effusion. Hepatobiliary: No focal liver abnormality is seen. No gallstones, gallbladder wall thickening, or biliary dilatation. Pancreas: Unremarkable. No pancreatic ductal dilatation or surrounding inflammatory changes. Spleen: Normal in size without focal abnormality. Adrenals/Urinary Tract: There are bilateral  double-J ureteral stents in place. There is some mild bilateral collecting system dilatation. The stents appear to terminate in the decompressed urinary bladder. There is a Foley catheter in place. Stomach/Bowel: The stomach is unremarkable. There is a moderate amount of stool in the colon. The appendix is unremarkable. There is no evidence of a small-bowel obstruction. Vascular/Lymphatic: An IVC filter is in place. There are some prominent but subcentimeter retroperitoneal lymph nodes. There are prominent inguinal and pelvic lymph nodes. Reproductive: There is a large prostate mass currently measuring approximately 7.8 by 8.4 cm (previously measuring 10 x 11 cm. There is a central low attenuation area measuring approximately 21 Hounsfield units. This may represent a necrotic portion of the tumor. Other: No abdominal wall hernia or abnormality. No abdominopelvic ascites. Musculoskeletal: No acute or significant osseous findings. IMPRESSION: 1. Examination limited by lack of IV contrast. 2. Dominant pelvic mass with interval decrease in size consistent with the patient's known prostate cancer. There is a central low-attenuation area which may represent necrosis. 3. Significant interval increase in size of a left lower lobe pulmonary nodule currently measuring approximately 1.6 cm. This is consistent with metastatic disease. 4. Cardiomegaly.  There is a trace right-sided pleural effusion. 5. Moderate amount of stool throughout the colon. 6. Bilateral double-J ureteral stents are in place. There is mild symmetric bilateral collecting system dilatation without frank hydronephrosis. 7. IVC filter in place. Electronically Signed   By: Constance Holster M.D.   On: 02/09/2019 23:43    Microbiology: Recent Results (from the past 240 hour(s))  Urine culture     Status: Abnormal   Collection Time: 02/09/19 10:05 PM   Specimen: Urine, Random  Result Value Ref Range Status   Specimen Description   Final    URINE,  RANDOM Performed at [facility] [address] [place], [place] [address]    Special Requests   Final    NONE Performed at [facility], [place] [address], [place], [place] [address]    Culture >=100,000 COLONIES/mL PSEUDOMONAS PUTIDA (A)  Final   Report Status 02/12/2019 FINAL  Final   Organism ID, Bacteria PSEUDOMONAS PUTIDA (A)  Final      Susceptibility   Pseudomonas putida - MIC*    CEFTAZIDIME 4 SENSITIVE Sensitive     CIPROFLOXACIN <=0.25 SENSITIVE Sensitive     GENTAMICIN <=1 SENSITIVE Sensitive     IMIPENEM 1 SENSITIVE Sensitive     PIP/TAZO 32 INTERMEDIATE Intermediate     CEFEPIME 2 SENSITIVE Sensitive     * >=100,000 COLONIES/mL PSEUDOMONAS PUTIDA  Urine Culture     Status: Abnormal   Collection Time: 02/10/19  4:55 PM   Specimen: Urine, Random  Result Value  Ref Range Status   Specimen Description   Final    URINE, RANDOM Performed at [facility] [address], [place], [place] [address]    Special Requests   Final    NONE Performed at [facility], [place] [address], [place], [place] [address]    Culture (A)  Final    <10,000 COLONIES/mL INSIGNIFICANT GROWTH Performed at [facility] [address] [place], [place] [address]    Report Status 02/12/2019 FINAL  Final  SARS Coronavirus 2 (CEPHEID - Performed in [place] hospital lab), Hosp Order     Status: None   Collection Time: 02/10/19  5:49 PM   Specimen: Nasopharyngeal Swab  Result Value Ref Range Status   SARS Coronavirus 2 NEGATIVE NEGATIVE Final    Comment: (NOTE) If result is NEGATIVE SARS-CoV-2 target nucleic acids are NOT DETECTED. The SARS-CoV-2 RNA is generally detectable in upper and lower  respiratory specimens during the acute phase of infection. The lowest  concentration of SARS-CoV-2 viral copies this assay can detect is 250  copies / mL. A negative result does not preclude SARS-CoV-2 infection  and  should not be used as the sole basis for treatment or other  patient management decisions.  A negative result may occur with  improper specimen collection / handling, submission of specimen other  than nasopharyngeal swab, presence of viral mutation(s) within the  areas targeted by this assay, and inadequate number of viral copies  (<250 copies / mL). A negative result must be combined with clinical  observations, patient history, and epidemiological information. If result is POSITIVE SARS-CoV-2 target nucleic acids are DETECTED. The SARS-CoV-2 RNA is generally detectable in upper and lower  respiratory specimens dur ing the acute phase of infection.  Positive  results are indicative of active infection with SARS-CoV-2.  Clinical  correlation with patient history and other diagnostic information is  necessary to determine patient infection status.  Positive results do  not rule out bacterial infection or co-infection with other viruses. If result is PRESUMPTIVE POSTIVE SARS-CoV-2 nucleic acids MAY BE PRESENT.   A presumptive positive result was obtained on the submitted specimen  and confirmed on repeat testing.  While 2019 novel coronavirus  (SARS-CoV-2) nucleic acids may be present in the submitted sample  additional confirmatory testing may be necessary for epidemiological  and / or clinical management purposes  to differentiate between  SARS-CoV-2 and other Sarbecovirus currently known to infect humans.  If clinically indicated additional testing with an alternate test  methodology [phone]) is advised. The SARS-CoV-2 RNA is generally  detectable in upper and lower respiratory sp ecimens during the acute  phase of infection. The expected result is Negative. Fact Sheet for Patients:  StrictlyIdeas.no Fact Sheet for Healthcare Providers: BankingDealers.co.za This test is not yet approved or cleared by the [place] FDA and has been  authorized for detection and/or diagnosis of SARS-CoV-2 by FDA under an Emergency Use Authorization (EUA).  This EUA will remain in effect (meaning this test can be used) for the duration of the COVID-19 declaration under Section 564(b)(1) of the Act, 21 U.S.C. section 360bbb-3(b)(1), unless the authorization is terminated or revoked sooner. Performed at [facility], [place] [address] [place], [place] [address]   Culture, blood (Routine X 2) w Reflex to ID Panel     Status: None   Collection Time: 02/10/19  6:07 PM   Specimen: Right Antecubital; Blood  Result Value Ref Range Status  Specimen Description   Final    RIGHT ANTECUBITAL Performed at [facility] [address] [place], [place] [address]    Special Requests   Final    BOTTLES DRAWN AEROBIC AND ANAEROBIC Blood Culture adequate volume Performed at [facility] [address], [place], [place] [address]    Culture   Final    NO GROWTH 5 DAYS Performed at [facility], [place] [address] [place], [place] [address]    Report Status 02/15/2019 FINAL  Final  Culture, blood (Routine X 2) w Reflex to ID Panel     Status: None   Collection Time: 02/10/19  6:08 PM   Specimen: BLOOD RIGHT HAND  Result Value Ref Range Status   Specimen Description   Final    BLOOD RIGHT HAND Performed at [facility] [address], [place], [place] [address]    Special Requests   Final    BOTTLES DRAWN AEROBIC AND ANAEROBIC Blood Culture results may not be optimal due to an inadequate volume of blood received in culture bottles Performed at [facility] [address], [place], [place] [address]    Culture   Final    NO GROWTH 5 DAYS Performed at [facility], [place] [address] [place], [place] [address]    Report Status 02/15/2019 FINAL  Final     Labs: Basic Metabolic Panel: Recent Labs  Lab 02/11/19 0704 02/12/19 0703  02/12/19 0954 02/13/19 0448 02/14/19 0518 02/15/19 0800  NA 138  --  139 140 140 143  K 3.6  --  3.3* 3.5 3.6 3.9  CL 102  --  105 107 105 105  CO2 25  --  26 26 27 30   GLUCOSE 121*  --  129* 117* 107* 132*  BUN 48*  --  42* 37* 37* 28*  CREATININE 2.46* 2.18* 2.02* 1.45* 1.53* 1.06  CALCIUM 8.5*  --  8.0* 8.2* 8.3* 8.5*  MG 2.1  --   --   --   --   --   PHOS 4.8*  --   --   --   --   --    Liver Function Tests: Recent Labs  Lab 02/09/19 2053 02/11/19 0704  AST 17 17  ALT 11 12  ALKPHOS 58 65  BILITOT 0.6 0.2*  PROT 8.1 7.8  ALBUMIN 2.9* 2.7*   Recent Labs  Lab 02/09/19 2053  LIPASE 18   No results for input(s): AMMONIA in the last 168 hours. CBC: Recent Labs  Lab 02/09/19 2053 02/10/19 1749 02/11/19 0704  02/12/19 0954 02/12/19 2205 02/13/19 0448 02/13/19 1702 02/14/19 0518 02/15/19 0800  WBC 12.0* 10.9* 13.0*  --  9.2  --  9.4  --  9.7 8.4  NEUTROABS 9.3* 8.5*  --   --   --   --   --   --   --  5.4  HGB 7.3* 7.9* 7.7*   < > 6.5* 8.6* 7.9* 8.5* 7.9* 8.2*  HCT 25.0* 27.0* 25.8*   < > 21.8* 27.8* 26.7* 28.2* 26.6* 27.2*  MCV 80.4 81.1 81.6  --  81.0  --  82.4  --  82.6 84.0  PLT 342 348 308  --  246  --  230  --  237 269   < > = values in this interval not displayed.   Cardiac Enzymes: No results for input(s): CKTOTAL, CKMB, CKMBINDEX, TROPONINI in the last 168 hours. BNP:  BNP (last 3 results) Recent Labs    10/21/18 1420 11/10/18 1542 02/11/19 1313  BNP 234.6* 100.7* 627.8*    ProBNP (last 3 results) No results for input(s): PROBNP in the last 8760 hours.  CBG: No results for input(s): GLUCAP in the last 168 hours.     Signed:  Alma Friendly, MD [facility] 02/15/2019, 12:37 PM

## 2019-02-15 NOTE — TOC Transition Note (Signed)
Transition of Care [facility]) - CM/SW Discharge Note   Patient Details  Name: Dylan Gonzalez MRN: [id number] Date of Birth: 1966-08-31  [facility] CM/SW Contact:  Dessa Phi, RN Phone Number: 02/15/2019, 1:00 PM   Clinical Narrative: Left vm with spouse to inform of d/c-call back #.[facility] care rep-Mary Lelon Frohlich aware of d/c today-will have home nurse for tomorrow. No dme needed. Transport by [phone]-will call when ready. DNR,forms in shadow chart.      Final next level of care: Home w Hospice Care Barriers to Discharge: No Barriers Identified   Patient Goals and CMS Choice Patient states their goals for this hospitalization and ongoing recovery are:: go home CMS Medicare.gov Compare Post Acute Care list provided to:: Patient Represenative (must comment) Choice offered to / list presented to : Spouse  Discharge Placement                       Discharge Plan and Services                          HH Arranged: RN [facility] Agency: Hospice and [place] Date [place]: 02/15/19 Time [place]: 1300 Representative spoke with at [place]: [place] Determinants of Health ([place]) Interventions     Readmission Risk Interventions Readmission Risk Prevention Plan 02/12/2019 08/05/2018  Transportation Screening Complete Complete  Medication Review [employer] photographer) Complete Complete  PCP or Specialist appointment within 3-5 days of discharge Not Complete Patient refused  PCP/Specialist Appt Not Complete comments not yet ready for d/c -  [place] or Home Care Consult Complete Complete  SW Recovery Care/Counseling Consult Complete -  Palliative Care Screening Complete -  [place] Not Applicable -  Some recent data might be hidden

## 2019-02-17 ENCOUNTER — Other Ambulatory Visit: Payer: Self-pay

## 2019-02-17 ENCOUNTER — Encounter (HOSPITAL_COMMUNITY): Payer: Self-pay | Admitting: Emergency Medicine

## 2019-02-17 ENCOUNTER — Emergency Department (HOSPITAL_COMMUNITY)
Admission: EM | Admit: 2019-02-17 | Discharge: 2019-02-17 | Disposition: A | Discharge status: Home / Self Care | Payer: Self-pay | Attending: Emergency Medicine | Admitting: Emergency Medicine

## 2019-02-17 DIAGNOSIS — I2510 Atherosclerotic heart disease of native coronary artery without angina pectoris: Secondary | ICD-10-CM | POA: Insufficient documentation

## 2019-02-17 DIAGNOSIS — T83021A Displacement of indwelling urethral catheter, initial encounter: Secondary | ICD-10-CM | POA: Insufficient documentation

## 2019-02-17 DIAGNOSIS — T839XXA Unspecified complication of genitourinary prosthetic device, implant and graft, initial encounter: Secondary | ICD-10-CM

## 2019-02-17 DIAGNOSIS — N182 Chronic kidney disease, stage 2 (mild): Secondary | ICD-10-CM | POA: Insufficient documentation

## 2019-02-17 DIAGNOSIS — I5032 Chronic diastolic (congestive) heart failure: Secondary | ICD-10-CM | POA: Insufficient documentation

## 2019-02-17 DIAGNOSIS — Z8546 Personal history of malignant neoplasm of prostate: Secondary | ICD-10-CM | POA: Insufficient documentation

## 2019-02-17 DIAGNOSIS — I130 Hypertensive heart and chronic kidney disease with heart failure and stage 1 through stage 4 chronic kidney disease, or unspecified chronic kidney disease: Secondary | ICD-10-CM | POA: Insufficient documentation

## 2019-02-17 DIAGNOSIS — Z85118 Personal history of other malignant neoplasm of bronchus and lung: Secondary | ICD-10-CM | POA: Insufficient documentation

## 2019-02-17 DIAGNOSIS — Y732 Prosthetic and other implants, materials and accessory gastroenterology and urology devices associated with adverse incidents: Secondary | ICD-10-CM | POA: Insufficient documentation

## 2019-02-17 DIAGNOSIS — Z79899 Other long term (current) drug therapy: Secondary | ICD-10-CM | POA: Insufficient documentation

## 2019-02-17 DIAGNOSIS — N3001 Acute cystitis with hematuria: Secondary | ICD-10-CM | POA: Insufficient documentation

## 2019-02-17 LAB — URINALYSIS, ROUTINE W REFLEX MICROSCOPIC
Bilirubin Urine: NEGATIVE
Glucose, UA: NEGATIVE mg/dL
Ketones, ur: NEGATIVE mg/dL
Nitrite: NEGATIVE
Protein, ur: 100 mg/dL — AB
RBC / HPF: >50 RBC/hpf — ABNORMAL HIGH (ref 0–5)
Specific Gravity, Urine: 1.01 (ref 1.005–1.030)
WBC, UA: >50 WBC/hpf — ABNORMAL HIGH (ref 0–5)
pH: 7 (ref 5.0–8.0)

## 2019-02-17 MED ORDER — OXYCODONE HCL 5 MG PO TABS
5.0000 mg | ORAL_TABLET | Freq: Once | ORAL | Status: AC
  Administered 2019-02-17: 5 mg via ORAL
  Filled 2019-02-17: qty 1

## 2019-02-17 NOTE — ED Provider Notes (Signed)
[facility] DEPT Provider Note   CSN: [id number] Arrival date & time: 02/17/19  1518    History   Chief Complaint Chief Complaint  Patient presents with  . Foley Pulled    HPI Dylan Gonzalez is a 53 y.o. male.     Pt presents to the ED today with a urinary catheter problem.  The pt has a hx of metastatic prostate cancer requiring a chronic foley catheter.  The pt said he got up this afternoon, tripped on something, and his foley was dislodged.  The pt has chronic lower abdominal pain from his prostate cancer.  No new pain.  No pain from fall.     Past Medical History:  Diagnosis Date  . Bilateral lower extremity edema   . Chronic diastolic congestive heart failure ([place]) 07/2018  . CKD (chronic kidney disease), stage II   . DOE (dyspnea on exertion)   . Foley catheter in place   . Hematuria   . History of CVA (cerebrovascular accident) 2007   per pt no residuals  . History of DVT of lower extremity 06/2018   left common femoral vein,  started on anticoagulant , stopped due to hematuria,  IVC filter placed 08-16-2018  . History of sepsis 09/28/2018   uti  . Hypertension   . Malignant neoplasm metastatic to lung ([place]) 04/19/2018  . Port-A-Cath in place 06/21/2018  . 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 S/P IVC filter 08/16/2018  . Urinary retention 09/28/2018   CHRONIC FOLEY CATH    Patient Active Problem List   Diagnosis Date Noted  . Chronic diastolic CHF (congestive heart failure) ([place]) 02/10/2019  . Hematuria 02/10/2019  . Dehydration 02/10/2019  . Abdominal pain 09/28/2018  . Sepsis ([place]) 09/28/2018  . Anemia 08/14/2018  . Symptomatic anemia 08/13/2018  . Acute UTI (urinary tract infection) 08/02/2018   . Deep vein thrombosis (DVT) of left lower extremity ([place]) 08/02/2018  . Bilateral lower extremity edema 08/01/2018  . CAD (coronary artery disease) 08/01/2018  . Hypokalemia 08/01/2018  . Port-A-Cath in place 06/29/2018  . Lung nodules 05/24/2018  . Leiomyosarcoma ([place]) 05/23/2018  . Goals of care, counseling/discussion 05/15/2018  . Bilateral ureteral obstruction 04/19/2018  . Malignant neoplasm metastatic to lung ([place]) 04/19/2018  . 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 Prostate neoplasm 04/13/2018  . Hypertension 12/26/2011  . 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 CYSTOSCOPY W/ URETERAL STENT PLACEMENT Bilateral 04/13/2018   Procedure: CYSTOSCOPY WITH BILATERAL STENT REPLACEMENT;  Surgeon: Irine Seal, MD;  Location: [facility];  Service: Urology;  Laterality: Bilateral;  . CYSTOSCOPY WITH STENT PLACEMENT Bilateral 10/19/2018   Procedure: CYSTOSCOPY WITH STENT EXCHANGE Bilateral;  Surgeon: Irine Seal, MD;  [facility];  Service: Urology;  Laterality: Bilateral;  . ENUCLEATION Left 1976   removal left eye due to Retinoplastoma  . INTRAOCULAR PROSTHESES INSERTION    . IR IMAGING GUIDED PORT INSERTION  06/21/2018  . IR IVC FILTER PLMT / S&I /IMG GUID/MOD SED  08/16/2018  . LAPAROSCOPIC INGUINAL HERNIA REPAIR Right 11-19-2010   dr d. Ninfa Linden  @[facility]    AND UMBILICAL HERNIA REPAIR  . PROSTATE BIOPSY [name] 04/13/2018   Procedure: ULTRASOUND GUIDED  PROSTATE BIOPSY;  Surgeon: Irine Seal, MD;  [facility];  Service: Urology;  Laterality: [name];  . TOOTH EXTRACTION Right 04/21/2013   Procedure: EXTRACTION MOLARS;  Surgeon: [name], DDS;  Location: [facility];  Service: Oral Surgery;  Laterality: Right;  . TRANSURETHRAL RESECTION OF PROSTATE  04/13/2018   Procedure: TRANSURETHRAL RESECTION OF THE PROSTATE (TURP);  Surgeon: Irine Seal, MD;  [facility];  Service: Urology;;        Home  Medications    Prior to Admission medications   Medication Sig Start Date End Date Taking? Authorizing Provider  albuterol (PROVENTIL HFA) 108 (90 Base) MCG/ACT inhaler Inhale 2 puffs into the lungs every 6 (six) hours as needed for wheezing or shortness of breath.     [provider]  Albuterol Sulfate 2.5 MG/0.5ML NEBU Inhale 1 each into the lungs every 6 (six) hours as needed (shortness of breath/wheezing).    [provider]  ciprofloxacin (CIPRO) 500 MG tablet Take 1 tablet (500 mg total) by mouth 2 (two) times daily for 5 days. 02/15/19 02/20/19  Alma Friendly, MD  diazepam (VALIUM) 5 MG tablet Take 5 mg by mouth 3 (three) times daily.    [provider]  escitalopram (LEXAPRO) 10 MG tablet Take 10 mg by mouth daily.    [provider]  fluticasone (FLONASE) 50 MCG/ACT nasal spray PLACE 2 SPRAYS INTO BOTH NOSTRILS DAILY. Patient taking differently: Place 2 sprays into both nostrils daily.  07/22/14   Le, Thao P, DO  gabapentin (NEURONTIN) 300 MG capsule Take 300 mg by mouth 3 (three) times daily.    [provider]  LORazepam (ATIVAN) 0.5 MG tablet Take 0.5 mg by mouth every 4 (four) hours as needed for anxiety.  11/03/18   [provider]  metoprolol tartrate (LOPRESSOR) 50 MG tablet Take 50 mg by mouth daily.     [provider]  mirabegron ER (MYRBETRIQ) 25 MG TB24 tablet Take 25 mg by mouth daily.    [provider]  morphine (MS CONTIN) 30 MG 12 hr tablet Take 1 tablet (30 mg total) by mouth every 12 (twelve) hours. Patient taking differently: Take 30 mg by mouth 3 (three) times daily.  10/30/18   [name], MD  opium-belladonna (B&O SUPPRETTES) 16.2-60 MG suppository Place 1 suppository rectally every 8 (eight) hours as needed for up to 5 days for bladder spasms. 02/15/19 02/20/19  Alma Friendly, MD  oxyCODONE (OXY IR/ROXICODONE) 5 MG immediate release tablet Take 5-20 mg by mouth See admin  instructions. Every 4 hours as needed for breakthrough pain:  5mg  for mild pain 10 mg for moderate pain 20 mg for severe pain    [provider]  polyethylene glycol (MIRALAX / GLYCOLAX) packet Take 17 g by mouth 2 (two) times daily as needed for mild constipation or moderate constipation. Patient taking differently: Take 17 g by mouth daily as needed for mild constipation or moderate constipation.  04/23/18   Orpah Greek, MD  sennosides-docusate sodium (SENOKOT-S) 8.6-50 MG tablet Take 1 tablet by mouth daily as needed for constipation.    [provider]    Family History Family History  Problem Relation Age of Onset  . Hypertension Father   . Depression Brother     Social History Social History   Tobacco Use  . Smoking status: Never Smoker  . Smokeless tobacco: Never Used  Substance Use Topics  . Alcohol use: No  . Drug use:  Never     Allergies   Dilaudid [hydromorphone hcl] and Pollen extract   Review of Systems Review of Systems  Genitourinary:       Foley cath problem  All other systems reviewed and are negative.    Physical Exam Updated Vital Signs BP (!) 145/103   Pulse 91   Temp (!) 97.5 F (36.4 C) (Oral)   Resp 18   SpO2 97%   Physical Exam Vitals signs and nursing note reviewed.  Constitutional:      Appearance: Normal appearance.  HENT:     Head: Normocephalic and atraumatic.     Right Ear: External ear normal.     Left Ear: External ear normal.     Nose: Nose normal.     Mouth/Throat:     Mouth: Mucous membranes are moist.     Pharynx: Oropharynx is clear.  Eyes:     Comments: Left eye prosthetic  Neck:     Musculoskeletal: Normal range of motion and neck supple.  Cardiovascular:     Rate and Rhythm: Normal rate and regular rhythm.     Pulses: Normal pulses.     Heart sounds: Normal heart sounds.  Pulmonary:     Effort: Pulmonary effort is normal.     Breath sounds: Normal breath sounds.  Abdominal:      General: Abdomen is flat. Bowel sounds are normal.     Palpations: Abdomen is soft.  Genitourinary:    Comments: FC in place, bag is gone. Musculoskeletal: Normal range of motion.  Skin:    General: Skin is warm.     Capillary Refill: Capillary refill takes less than 2 seconds.  Neurological:     General: No focal deficit present.     Mental Status: He is alert and oriented to person, place, and time.  Psychiatric:        Mood and Affect: Mood normal.        Behavior: Behavior normal.      ED Treatments / Results  Labs (all labs ordered are listed, but only abnormal results are displayed) Labs Reviewed  URINALYSIS, ROUTINE W REFLEX MICROSCOPIC - Abnormal; Notable for the following components:      Result Value   APPearance HAZY (*)    Hgb urine dipstick LARGE (*)    Protein, ur 100 (*)    Leukocytes,Ua LARGE (*)    RBC / HPF >50 (*)    WBC, UA >50 (*)    Bacteria, UA RARE (*)    All other components within normal limits  URINE CULTURE    EKG None  Radiology No results found.  Procedures Procedures (including critical care time)  Medications Ordered in ED Medications  oxyCODONE (Oxy IR/ROXICODONE) immediate release tablet 5 mg (5 mg Oral Given 02/17/19 1540)     Initial Impression / Assessment and Plan / ED Course  I have reviewed the triage vital signs and the nursing notes.  Pertinent labs & imaging results that were available during my care of the patient were reviewed by me and considered in my medical decision making (see chart for details).     We hooked pt's foley up to a bag and it is draining well.  We did not have to insert a new foley.  Urine is + for infection.  He is on cipro and is told to continue that.  There is some blood in his urine from the trauma of the foley getting pulled.  Urine will be sent for  culture.  Pt instructed to f/u with Dr. Jeffie Pollock, his urologist.    Final Clinical Impressions(s) / ED Diagnoses   Final diagnoses:  Foley  catheter problem, initial encounter [facility])  Acute cystitis with hematuria    ED Discharge Orders    None       Isla Pence, MD 02/17/19 1800

## 2019-02-17 NOTE — ED Notes (Signed)
Bed: IH53 Expected date:  Expected time:  Means of arrival:  Comments: EMS/Foley cath. problem

## 2019-02-17 NOTE — ED Triage Notes (Signed)
Per EMS pt stepped on foley by accident. When assessed blood noted around penis; foley catheter noted intact but collection bag detached. Leg bag placed per provider request to see if urine collects.

## 2019-02-17 NOTE — ED Notes (Signed)
[facility] called to transport pt home 

## 2019-02-17 NOTE — Discharge Instructions (Addendum)
Continue cipro antibiotics.

## 2019-02-19 LAB — URINE CULTURE: Culture: NO GROWTH

## 2019-02-25 ENCOUNTER — Encounter (HOSPITAL_COMMUNITY): Payer: Self-pay | Admitting: Emergency Medicine

## 2019-02-25 ENCOUNTER — Emergency Department (HOSPITAL_COMMUNITY)
Admission: EM | Admit: 2019-02-25 | Discharge: 2019-02-25 | Disposition: A | Discharge status: Home / Self Care | Payer: Self-pay | Attending: Emergency Medicine | Admitting: Emergency Medicine

## 2019-02-25 ENCOUNTER — Other Ambulatory Visit: Payer: Self-pay

## 2019-02-25 DIAGNOSIS — I5032 Chronic diastolic (congestive) heart failure: Secondary | ICD-10-CM | POA: Insufficient documentation

## 2019-02-25 DIAGNOSIS — Z8673 Personal history of transient ischemic attack (TIA), and cerebral infarction without residual deficits: Secondary | ICD-10-CM | POA: Insufficient documentation

## 2019-02-25 DIAGNOSIS — T83091A Other mechanical complication of indwelling urethral catheter, initial encounter: Secondary | ICD-10-CM | POA: Insufficient documentation

## 2019-02-25 DIAGNOSIS — I2510 Atherosclerotic heart disease of native coronary artery without angina pectoris: Secondary | ICD-10-CM | POA: Insufficient documentation

## 2019-02-25 DIAGNOSIS — N182 Chronic kidney disease, stage 2 (mild): Secondary | ICD-10-CM | POA: Insufficient documentation

## 2019-02-25 DIAGNOSIS — Z85118 Personal history of other malignant neoplasm of bronchus and lung: Secondary | ICD-10-CM | POA: Insufficient documentation

## 2019-02-25 DIAGNOSIS — Z79899 Other long term (current) drug therapy: Secondary | ICD-10-CM | POA: Insufficient documentation

## 2019-02-25 DIAGNOSIS — Y733 Surgical instruments, materials and gastroenterology and urology devices (including sutures) associated with adverse incidents: Secondary | ICD-10-CM | POA: Insufficient documentation

## 2019-02-25 DIAGNOSIS — I130 Hypertensive heart and chronic kidney disease with heart failure and stage 1 through stage 4 chronic kidney disease, or unspecified chronic kidney disease: Secondary | ICD-10-CM | POA: Insufficient documentation

## 2019-02-25 LAB — URINALYSIS, ROUTINE W REFLEX MICROSCOPIC
Bacteria, UA: NONE SEEN
Bilirubin Urine: NEGATIVE
Glucose, UA: NEGATIVE mg/dL
Ketones, ur: NEGATIVE mg/dL
Nitrite: NEGATIVE
Protein, ur: 100 mg/dL — AB
RBC / HPF: >50 RBC/hpf — ABNORMAL HIGH (ref 0–5)
Specific Gravity, Urine: 1.009 (ref 1.005–1.030)
WBC, UA: >50 WBC/hpf — ABNORMAL HIGH (ref 0–5)
pH: 6 (ref 5.0–8.0)

## 2019-02-25 MED ORDER — LIDOCAINE HCL URETHRAL/MUCOSAL 2 % EX GEL
1.0000 "application " | Freq: Once | CUTANEOUS | Status: DC | PRN
  Filled 2019-02-25: qty 30

## 2019-02-25 MED ORDER — LIDOCAINE HCL (PF) 1 % IJ SOLN
INTRAMUSCULAR | Status: AC
  Filled 2019-02-25: qty 30

## 2019-02-25 NOTE — Discharge Instructions (Addendum)
Contact your urologist Monday for recheck and also to follow urine culture.

## 2019-02-25 NOTE — ED Notes (Signed)
Wife, Bertin Inabinet at [phone]

## 2019-02-25 NOTE — ED Notes (Signed)
Pt stood at bedside to use [facility] with minimal assist. Tolerated well.

## 2019-02-25 NOTE — ED Provider Notes (Signed)
[facility] DEPT Provider Note   CSN: [id number] Arrival date & time: 02/25/19  0141    History   Chief Complaint Chief Complaint  Patient presents with  . Urinary Retention    HPI Dylan Gonzalez is a 53 y.o. male.     Patient presents to the emergency departmentPatient presents to the emergency department home by EMS.  Patient has a chronic indwelling Foley catheter secondary to a history of prostate cancer.  He reports that he has not had any urine output into his Foley for the last 6 or 7 hours.  He has had progressively increasing bladder pain.  No associated fever, nausea, vomiting.  He denies any trauma to the Foley, although he was recently seen after he fell and accidentally dislodged his catheter.     Past Medical History:  Diagnosis Date  . Bilateral lower extremity edema   . Chronic diastolic congestive heart failure ([place]) 07/2018  . CKD (chronic kidney disease), stage II   . DOE (dyspnea on exertion)   . Foley catheter in place   . Hematuria   . History of CVA (cerebrovascular accident) 2007   per pt no residuals  . History of DVT of lower extremity 06/2018   left common femoral vein,  started on anticoagulant , stopped due to hematuria,  IVC filter placed 08-16-2018  . History of sepsis 09/28/2018   uti  . Hypertension   . Malignant neoplasm metastatic to lung ([place]) 04/19/2018  . Port-A-Cath in place 06/21/2018  . 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 S/P IVC filter 08/16/2018  . Urinary retention 09/28/2018   CHRONIC FOLEY CATH    Patient Active Problem List   Diagnosis Date Noted  . Chronic diastolic CHF (congestive heart failure) ([place]) 02/10/2019  . Hematuria 02/10/2019  . Dehydration 02/10/2019   . Abdominal pain 09/28/2018  . Sepsis ([place]) 09/28/2018  . Anemia 08/14/2018  . Symptomatic anemia 08/13/2018  . Acute UTI (urinary tract infection) 08/02/2018  . Deep vein thrombosis (DVT) of left lower extremity ([place]) 08/02/2018  . Bilateral lower extremity edema 08/01/2018  . CAD (coronary artery disease) 08/01/2018  . Hypokalemia 08/01/2018  . Port-A-Cath in place 06/29/2018  . Lung nodules 05/24/2018  . Leiomyosarcoma ([place]) 05/23/2018  . Goals of care, counseling/discussion 05/15/2018  . Bilateral ureteral obstruction 04/19/2018  . Malignant neoplasm metastatic to lung ([place]) 04/19/2018  . Primary leiomyosarcoma of intra-abdominal site ([place]) 04/18/2018  . Prostate neoplasm 04/13/2018  . Hypertension 12/26/2011  . 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 CYSTOSCOPY W/ URETERAL STENT PLACEMENT Bilateral 04/13/2018   Procedure: CYSTOSCOPY WITH BILATERAL STENT REPLACEMENT;  Surgeon: Irine Seal, MD;  [facility];  Service: Urology;  Laterality: Bilateral;  . CYSTOSCOPY WITH STENT PLACEMENT Bilateral 10/19/2018   Procedure: CYSTOSCOPY WITH STENT EXCHANGE Bilateral;  Surgeon: Irine Seal, MD;  [facility];  Service: Urology;  Laterality: Bilateral;  . ENUCLEATION Left 1976   removal left eye due to Retinoplastoma  . INTRAOCULAR PROSTHESES INSERTION    . IR IMAGING GUIDED PORT INSERTION  06/21/2018  . IR IVC FILTER PLMT / S&I /IMG GUID/MOD SED  08/16/2018  . LAPAROSCOPIC INGUINAL HERNIA REPAIR Right 11-19-2010   dr  d. blackman  @[facility]    AND UMBILICAL HERNIA REPAIR  . PROSTATE BIOPSY [name] 04/13/2018   Procedure: ULTRASOUND GUIDED PROSTATE BIOPSY;  Surgeon: Irine Seal, MD;  [facility];  Service: Urology;  Laterality: [name];  . TOOTH EXTRACTION Right 04/21/2013   Procedure: EXTRACTION MOLARS;  Surgeon: [name], DDS;  Location: [facility];  Service: Oral Surgery;  Laterality: Right;  . TRANSURETHRAL RESECTION OF  PROSTATE  04/13/2018   Procedure: TRANSURETHRAL RESECTION OF THE PROSTATE (TURP);  Surgeon: Irine Seal, MD;  [facility];  Service: Urology;;        Home Medications    Prior to Admission medications   Medication Sig Start Date End Date Taking? Authorizing Provider  albuterol (PROVENTIL HFA) 108 (90 Base) MCG/ACT inhaler Inhale 2 puffs into the lungs every 6 (six) hours as needed for wheezing or shortness of breath.     [provider]  Albuterol Sulfate 2.5 MG/0.5ML NEBU Inhale 1 each into the lungs every 6 (six) hours as needed (shortness of breath/wheezing).    [provider]  diazepam (VALIUM) 5 MG tablet Take 5 mg by mouth 3 (three) times daily.    [provider]  escitalopram (LEXAPRO) 10 MG tablet Take 10 mg by mouth daily.    [provider]  fluticasone (FLONASE) 50 MCG/ACT nasal spray PLACE 2 SPRAYS INTO BOTH NOSTRILS DAILY. Patient taking differently: Place 2 sprays into both nostrils daily.  07/22/14   Le, Thao P, DO  gabapentin (NEURONTIN) 300 MG capsule Take 300 mg by mouth 3 (three) times daily.    [provider]  LORazepam (ATIVAN) 0.5 MG tablet Take 0.5 mg by mouth every 4 (four) hours as needed for anxiety.  11/03/18   [provider]  metoprolol tartrate (LOPRESSOR) 50 MG tablet Take 50 mg by mouth daily.     [provider]  mirabegron ER (MYRBETRIQ) 25 MG TB24 tablet Take 25 mg by mouth daily.    [provider]  morphine (MS CONTIN) 30 MG 12 hr tablet Take 1 tablet (30 mg total) by mouth every 12 (twelve) hours. Patient taking differently: Take 30 mg by mouth 3 (three) times daily.  10/30/18   [name], MD  opium-belladonna (B&O SUPPRETTES) 16.2-60 MG suppository Place 1 suppository rectally every 8 (eight) hours as needed for up to 5 days for bladder spasms. 02/15/19 02/20/19  Alma Friendly, MD  oxyCODONE (OXY IR/ROXICODONE) 5 MG immediate release tablet Take 5-20 mg by mouth See  admin instructions. Every 4 hours as needed for breakthrough pain:  5mg  for mild pain 10 mg for moderate pain 20 mg for severe pain    [provider]  polyethylene glycol (MIRALAX / GLYCOLAX) packet Take 17 g by mouth 2 (two) times daily as needed for mild constipation or moderate constipation. Patient taking differently: Take 17 g by mouth daily as needed for mild constipation or moderate constipation.  04/23/18   Orpah Greek, MD  sennosides-docusate sodium (SENOKOT-S) 8.6-50 MG tablet Take 1 tablet by mouth daily as needed for constipation.    [provider]    Family History Family History  Problem Relation Age of Onset  . Hypertension Father   . Depression Brother     Social History Social History   Tobacco Use  . Smoking status: Never Smoker  . Smokeless tobacco: Never Used  Substance Use Topics  . Alcohol use: No  . Drug use: Never     Allergies  Dilaudid [hydromorphone hcl] and Pollen extract   Review of Systems Review of Systems  Genitourinary: Positive for decreased urine volume.  All other systems reviewed and are negative.    Physical Exam Updated Vital Signs BP (!) 153/105 (BP Location: Left Arm)   Pulse 97   Temp 98.2 F (36.8 C) (Oral)   Resp (!) 25   Ht 5\' 11"  (1.803 m)   Wt 83.9 kg   SpO2 100%   BMI 25.80 kg/m   Physical Exam Vitals signs and nursing note reviewed.  Constitutional:      General: He is not in acute distress.    Appearance: Normal appearance. He is well-developed.  HENT:     Head: Normocephalic and atraumatic.     Right Ear: Hearing normal.     Left Ear: Hearing normal.     Nose: Nose normal.  Eyes:     Conjunctiva/sclera: Conjunctivae normal.     Pupils: Pupils are equal, round, and reactive to light.  Neck:     Musculoskeletal: Normal range of motion and neck supple.  Cardiovascular:     Rate and Rhythm: Regular rhythm.     Heart sounds: S1 normal and S2 normal. No murmur. No friction  rub. No gallop.   Pulmonary:     Effort: Pulmonary effort is normal. No respiratory distress.     Breath sounds: Normal breath sounds.  Chest:     Chest wall: No tenderness.  Abdominal:     General: Bowel sounds are normal.     Palpations: Abdomen is soft.     Tenderness: There is abdominal tenderness in the suprapubic area. There is no guarding or rebound. Negative signs include Murphy's sign and McBurney's sign.     Hernia: No hernia is present.  Musculoskeletal: Normal range of motion.  Skin:    General: Skin is warm and dry.     Findings: No rash.  Neurological:     Mental Status: He is alert and oriented to person, place, and time.     GCS: GCS eye subscore is 4. GCS verbal subscore is 5. GCS motor subscore is 6.     Cranial Nerves: No cranial nerve deficit.     Sensory: No sensory deficit.     Coordination: Coordination normal.  Psychiatric:        Speech: Speech normal.        Behavior: Behavior normal.        Thought Content: Thought content normal.      ED Treatments / Results  Labs (all labs ordered are listed, but only abnormal results are displayed) Labs Reviewed  URINALYSIS, ROUTINE W REFLEX MICROSCOPIC - Abnormal; Notable for the following components:      Result Value   Color, Urine AMBER (*)    APPearance TURBID (*)    Hgb urine dipstick LARGE (*)    Protein, ur 100 (*)    Leukocytes,Ua LARGE (*)    RBC / HPF >50 (*)    WBC, UA >50 (*)    All other components within normal limits  URINE CULTURE    EKG None  Radiology No results found.  Procedures Procedures (including critical care time)  Medications Ordered in ED Medications  lidocaine (XYLOCAINE) 2 % jelly 1 application (has no administration in time range)  lidocaine (PF) (XYLOCAINE) 1 % injection (has no administration in time range)     Initial Impression / Assessment and Plan / ED Course  I have reviewed the triage vital signs and  the nursing notes.  Pertinent labs & imaging  results that were available during my care of the patient were reviewed by me and considered in my medical decision making (see chart for details).        Patient presents to the emergency department for evaluation of Foley catheter dysfunction.  Patient has a chronic indwelling Foley catheter and has not noticed any urine drainage in the last 6 hours or more.  He was complaining of bladder spasm pain and had tenderness in the suprapubic region.  A catheter was placed and patient had relief of his symptoms.  As this is a chronic indwelling Foley catheter, no empiric antibiotic coverage at this time, will send urine culture.  Follow-up with urology.  Final Clinical Impressions(s) / ED Diagnoses   Final diagnoses:  Obstruction of Foley catheter, initial encounter [facility])    ED Discharge Orders    None       Orpah Greek, MD 02/25/19 [phone]

## 2019-02-25 NOTE — ED Notes (Signed)
Bed: BU03 Expected date:  Expected time:  Means of arrival:  Comments: EMS 53 yo male urinary retention x 6 hours-Stage IV bladder cancer-168/117-Fentanyl IV

## 2019-02-25 NOTE — ED Triage Notes (Signed)
Arrives via EMS from -C/C urinary retention x6 hours, and abd pain. Patient does have urinary catheter in place, not draining. Morphine tablet 30 mg at 2345, EMS gave 50 mcg Fentanyl en route IV. CBG 134. Patient on hospice, DNR in room.

## 2019-02-26 LAB — URINE CULTURE: Culture: <10000 — AB

## 2019-03-01 ENCOUNTER — Encounter (HOSPITAL_COMMUNITY): Payer: Self-pay

## 2019-03-01 ENCOUNTER — Inpatient Hospital Stay (HOSPITAL_COMMUNITY)
Admission: EM | Admit: 2019-03-01 | Discharge: 2019-03-03 | DRG: 698 | Disposition: A | Discharge status: Home / Self Care | Payer: Self-pay | Attending: Internal Medicine | Admitting: Internal Medicine

## 2019-03-01 ENCOUNTER — Other Ambulatory Visit: Payer: Self-pay | Admitting: Urology

## 2019-03-01 ENCOUNTER — Other Ambulatory Visit: Payer: Self-pay

## 2019-03-01 DIAGNOSIS — N183 Chronic kidney disease, stage 3 unspecified: Secondary | ICD-10-CM

## 2019-03-01 DIAGNOSIS — C494 Malignant neoplasm of connective and soft tissue of abdomen: Secondary | ICD-10-CM | POA: Diagnosis present

## 2019-03-01 DIAGNOSIS — C499 Malignant neoplasm of connective and soft tissue, unspecified: Secondary | ICD-10-CM | POA: Diagnosis present

## 2019-03-01 DIAGNOSIS — N179 Acute kidney failure, unspecified: Secondary | ICD-10-CM

## 2019-03-01 DIAGNOSIS — Z9079 Acquired absence of other genital organ(s): Secondary | ICD-10-CM

## 2019-03-01 DIAGNOSIS — Q549 Hypospadias, unspecified: Secondary | ICD-10-CM

## 2019-03-01 DIAGNOSIS — Z8673 Personal history of transient ischemic attack (TIA), and cerebral infarction without residual deficits: Secondary | ICD-10-CM

## 2019-03-01 DIAGNOSIS — T83511A Infection and inflammatory reaction due to indwelling urethral catheter, initial encounter: Principal | ICD-10-CM | POA: Diagnosis present

## 2019-03-01 DIAGNOSIS — Z79891 Long term (current) use of opiate analgesic: Secondary | ICD-10-CM

## 2019-03-01 DIAGNOSIS — C61 Malignant neoplasm of prostate: Secondary | ICD-10-CM | POA: Diagnosis present

## 2019-03-01 DIAGNOSIS — I10 Essential (primary) hypertension: Secondary | ICD-10-CM | POA: Diagnosis present

## 2019-03-01 DIAGNOSIS — A419 Sepsis, unspecified organism: Secondary | ICD-10-CM | POA: Diagnosis present

## 2019-03-01 DIAGNOSIS — Z20828 Contact with and (suspected) exposure to other viral communicable diseases: Secondary | ICD-10-CM | POA: Diagnosis present

## 2019-03-01 DIAGNOSIS — R4781 Slurred speech: Secondary | ICD-10-CM | POA: Diagnosis present

## 2019-03-01 DIAGNOSIS — Z818 Family history of other mental and behavioral disorders: Secondary | ICD-10-CM

## 2019-03-01 DIAGNOSIS — E860 Dehydration: Secondary | ICD-10-CM | POA: Diagnosis present

## 2019-03-01 DIAGNOSIS — Z95828 Presence of other vascular implants and grafts: Secondary | ICD-10-CM

## 2019-03-01 DIAGNOSIS — Y846 Urinary catheterization as the cause of abnormal reaction of the patient, or of later complication, without mention of misadventure at the time of the procedure: Secondary | ICD-10-CM | POA: Diagnosis present

## 2019-03-01 DIAGNOSIS — N390 Urinary tract infection, site not specified: Secondary | ICD-10-CM | POA: Diagnosis present

## 2019-03-01 DIAGNOSIS — Z8249 Family history of ischemic heart disease and other diseases of the circulatory system: Secondary | ICD-10-CM

## 2019-03-01 DIAGNOSIS — Z66 Do not resuscitate: Secondary | ICD-10-CM | POA: Diagnosis present

## 2019-03-01 DIAGNOSIS — R338 Other retention of urine: Secondary | ICD-10-CM | POA: Diagnosis present

## 2019-03-01 DIAGNOSIS — N170 Acute kidney failure with tubular necrosis: Secondary | ICD-10-CM | POA: Diagnosis present

## 2019-03-01 DIAGNOSIS — R410 Disorientation, unspecified: Secondary | ICD-10-CM

## 2019-03-01 DIAGNOSIS — R339 Retention of urine, unspecified: Secondary | ICD-10-CM

## 2019-03-01 DIAGNOSIS — Z79899 Other long term (current) drug therapy: Secondary | ICD-10-CM

## 2019-03-01 DIAGNOSIS — Z888 Allergy status to other drugs, medicaments and biological substances status: Secondary | ICD-10-CM

## 2019-03-01 DIAGNOSIS — Z85840 Personal history of malignant neoplasm of eye: Secondary | ICD-10-CM

## 2019-03-01 DIAGNOSIS — I130 Hypertensive heart and chronic kidney disease with heart failure and stage 1 through stage 4 chronic kidney disease, or unspecified chronic kidney disease: Secondary | ICD-10-CM | POA: Diagnosis present

## 2019-03-01 DIAGNOSIS — I5032 Chronic diastolic (congestive) heart failure: Secondary | ICD-10-CM | POA: Diagnosis present

## 2019-03-01 DIAGNOSIS — D4959 Neoplasm of unspecified behavior of other genitourinary organ: Secondary | ICD-10-CM | POA: Diagnosis present

## 2019-03-01 DIAGNOSIS — Z86718 Personal history of other venous thrombosis and embolism: Secondary | ICD-10-CM

## 2019-03-01 DIAGNOSIS — C7802 Secondary malignant neoplasm of left lung: Secondary | ICD-10-CM | POA: Diagnosis present

## 2019-03-01 DIAGNOSIS — Z885 Allergy status to narcotic agent status: Secondary | ICD-10-CM

## 2019-03-01 LAB — URINALYSIS, ROUTINE W REFLEX MICROSCOPIC
Bacteria, UA: NONE SEEN
Bilirubin Urine: NEGATIVE
Glucose, UA: NEGATIVE mg/dL
Ketones, ur: NEGATIVE mg/dL
Nitrite: NEGATIVE
Protein, ur: >=300 mg/dL — AB
RBC / HPF: >50 RBC/hpf — ABNORMAL HIGH (ref 0–5)
Specific Gravity, Urine: 1.008 (ref 1.005–1.030)
WBC, UA: >50 WBC/hpf — ABNORMAL HIGH (ref 0–5)
pH: 6 (ref 5.0–8.0)

## 2019-03-01 LAB — CBC WITH DIFFERENTIAL/PLATELET
Abs Immature Granulocytes: 0.06 10*3/uL (ref 0.00–0.07)
Basophils Absolute: 0.1 10*3/uL (ref 0.0–0.1)
Basophils Relative: 0 %
Eosinophils Absolute: 0 10*3/uL (ref 0.0–0.5)
Eosinophils Relative: 0 %
HCT: 29.5 % — ABNORMAL LOW (ref 39.0–52.0)
Hemoglobin: 8.8 g/dL — ABNORMAL LOW (ref 13.0–17.0)
Immature Granulocytes: 1 %
Lymphocytes Relative: 9 %
Lymphs Abs: 1.1 10*3/uL (ref 0.7–4.0)
MCH: 25.4 pg — ABNORMAL LOW (ref 26.0–34.0)
MCHC: 29.8 g/dL — ABNORMAL LOW (ref 30.0–36.0)
MCV: 85 fL (ref 80.0–100.0)
Monocytes Absolute: 1.8 10*3/uL — ABNORMAL HIGH (ref 0.1–1.0)
Monocytes Relative: 14 %
Neutro Abs: 9.4 10*3/uL — ABNORMAL HIGH (ref 1.7–7.7)
Neutrophils Relative %: 76 %
Platelets: 352 10*3/uL (ref 150–400)
RBC: 3.47 MIL/uL — ABNORMAL LOW (ref 4.22–5.81)
RDW: 20.3 % — ABNORMAL HIGH (ref 11.5–15.5)
WBC: 12.4 10*3/uL — ABNORMAL HIGH (ref 4.0–10.5)
nRBC: 0 % (ref 0.0–0.2)

## 2019-03-01 LAB — COMPREHENSIVE METABOLIC PANEL
ALT: 12 U/L (ref 0–44)
AST: 18 U/L (ref 15–41)
Albumin: 3.1 g/dL — ABNORMAL LOW (ref 3.5–5.0)
Alkaline Phosphatase: 60 U/L (ref 38–126)
Anion gap: 10 (ref 5–15)
BUN: 22 mg/dL — ABNORMAL HIGH (ref 6–20)
CO2: 26 mmol/L (ref 22–32)
Calcium: 8.6 mg/dL — ABNORMAL LOW (ref 8.9–10.3)
Chloride: 100 mmol/L (ref 98–111)
Creatinine, Ser: 2.2 mg/dL — ABNORMAL HIGH (ref 0.61–1.24)
GFR calc Af Amer: 38 mL/min — ABNORMAL LOW (ref >60)
GFR calc non Af Amer: 33 mL/min — ABNORMAL LOW (ref >60)
Glucose, Bld: 138 mg/dL — ABNORMAL HIGH (ref 70–99)
Potassium: 4.3 mmol/L (ref 3.5–5.1)
Sodium: 136 mmol/L (ref 135–145)
Total Bilirubin: 0.4 mg/dL (ref 0.3–1.2)
Total Protein: 8.8 g/dL — ABNORMAL HIGH (ref 6.5–8.1)

## 2019-03-01 MED ORDER — SENNOSIDES-DOCUSATE SODIUM 8.6-50 MG PO TABS
2.0000 | ORAL_TABLET | Freq: Every day | ORAL | Status: DC
  Administered 2019-03-02 – 2019-03-03 (×2): 2 via ORAL
  Filled 2019-03-01 (×2): qty 2

## 2019-03-01 MED ORDER — GABAPENTIN 300 MG PO CAPS
300.0000 mg | ORAL_CAPSULE | Freq: Three times a day (TID) | ORAL | Status: DC
  Administered 2019-03-01 – 2019-03-03 (×4): 300 mg via ORAL
  Filled 2019-03-01 (×4): qty 1

## 2019-03-01 MED ORDER — ACETAMINOPHEN 650 MG RE SUPP: 650.0000 mg | Freq: Four times a day (QID) | RECTAL | Status: DC | PRN

## 2019-03-01 MED ORDER — LORAZEPAM 0.5 MG PO TABS: 0.5000 mg | ORAL_TABLET | ORAL | Status: DC | PRN

## 2019-03-01 MED ORDER — ALBUTEROL SULFATE (2.5 MG/3ML) 0.083% IN NEBU: 2.5000 mg | INHALATION_SOLUTION | Freq: Four times a day (QID) | RESPIRATORY_TRACT | Status: DC | PRN

## 2019-03-01 MED ORDER — ENOXAPARIN SODIUM 40 MG/0.4ML ~~LOC~~ SOLN
40.0000 mg | SUBCUTANEOUS | Status: DC
  Administered 2019-03-01 – 2019-03-03 (×2): 40 mg via SUBCUTANEOUS
  Filled 2019-03-01 (×2): qty 0.4

## 2019-03-01 MED ORDER — SODIUM CHLORIDE 0.9% FLUSH
3.0000 mL | Freq: Two times a day (BID) | INTRAVENOUS | Status: DC
  Administered 2019-03-02 (×2): 3 mL via INTRAVENOUS

## 2019-03-01 MED ORDER — LIDOCAINE HCL URETHRAL/MUCOSAL 2 % EX GEL
1.0000 "application " | Freq: Once | CUTANEOUS | Status: AC
  Administered 2019-03-01: 1 via URETHRAL
  Filled 2019-03-01: qty 5

## 2019-03-01 MED ORDER — MIRABEGRON ER 25 MG PO TB24: 25.0000 mg | ORAL_TABLET | Freq: Every day | ORAL | Status: DC

## 2019-03-01 MED ORDER — MAGNESIUM CITRATE PO SOLN: 1.0000 | Freq: Every day | ORAL | Status: DC | PRN

## 2019-03-01 MED ORDER — MIRABEGRON ER 25 MG PO TB24
25.0000 mg | ORAL_TABLET | Freq: Every day | ORAL | Status: DC
  Administered 2019-03-01 – 2019-03-03 (×3): 25 mg via ORAL
  Filled 2019-03-01 (×3): qty 1

## 2019-03-01 MED ORDER — ONDANSETRON HCL 4 MG/2ML IJ SOLN: 4.0000 mg | Freq: Four times a day (QID) | INTRAMUSCULAR | Status: DC | PRN

## 2019-03-01 MED ORDER — ACETAMINOPHEN 325 MG PO TABS
650.0000 mg | ORAL_TABLET | Freq: Four times a day (QID) | ORAL | Status: DC | PRN
  Administered 2019-03-02 – 2019-03-03 (×3): 650 mg via ORAL
  Filled 2019-03-01 (×4): qty 2

## 2019-03-01 MED ORDER — SODIUM CHLORIDE 0.9 % IV SOLN
INTRAVENOUS | Status: DC
  Administered 2019-03-01 – 2019-03-03 (×4): via INTRAVENOUS

## 2019-03-01 MED ORDER — FLUTICASONE PROPIONATE 50 MCG/ACT NA SUSP: 1.0000 | Freq: Every day | NASAL | Status: DC

## 2019-03-01 MED ORDER — ESCITALOPRAM OXALATE 10 MG PO TABS
10.0000 mg | ORAL_TABLET | Freq: Every day | ORAL | Status: DC
  Administered 2019-03-02 – 2019-03-03 (×2): 10 mg via ORAL
  Filled 2019-03-01 (×2): qty 1

## 2019-03-01 MED ORDER — METOPROLOL TARTRATE 50 MG PO TABS
50.0000 mg | ORAL_TABLET | Freq: Every day | ORAL | Status: DC
  Administered 2019-03-02 – 2019-03-03 (×2): 50 mg via ORAL
  Filled 2019-03-01 (×2): qty 1

## 2019-03-01 MED ORDER — SENNOSIDES-DOCUSATE SODIUM 8.6-50 MG PO TABS
1.0000 | ORAL_TABLET | Freq: Every day | ORAL | Status: DC
  Administered 2019-03-01 – 2019-03-03 (×2): 1 via ORAL
  Filled 2019-03-01 (×2): qty 1

## 2019-03-01 MED ORDER — ENSURE ENLIVE PO LIQD
237.0000 mL | Freq: Two times a day (BID) | ORAL | Status: DC
  Administered 2019-03-02 – 2019-03-03 (×2): 237 mL via ORAL

## 2019-03-01 MED ORDER — SODIUM CHLORIDE 0.9 % IV BOLUS
1000.0000 mL | Freq: Once | INTRAVENOUS | Status: AC
  Administered 2019-03-01: 1000 mL via INTRAVENOUS

## 2019-03-01 MED ORDER — TAMSULOSIN HCL 0.4 MG PO CAPS
0.4000 mg | ORAL_CAPSULE | Freq: Every day | ORAL | Status: DC
  Administered 2019-03-01 – 2019-03-03 (×2): 0.4 mg via ORAL
  Filled 2019-03-01 (×2): qty 1

## 2019-03-01 MED ORDER — ONDANSETRON HCL 4 MG PO TABS: 4.0000 mg | ORAL_TABLET | Freq: Four times a day (QID) | ORAL | Status: DC | PRN

## 2019-03-01 NOTE — Progress Notes (Signed)
Pt had new foley placed in ED today.  Pt has few drops of red colored urine in drainage bag, not releay drainage since placed at 1700.  Bladder scan performed says 493 ml in bladder.  Pt also c/o having bladder spasms.  [place] Hals notified.  Awaiting any new orders.

## 2019-03-01 NOTE — ED Notes (Signed)
ED TO INPATIENT HANDOFF REPORT  ED Nurse Name and Phone #: Christinia Gully Name/Age/Gender Dylan Gonzalez 53 y.o. male Room/Bed: WA08/WA08  Code Status   Code Status: Prior  Home/SNF/Other Given to floor Patient oriented to: self, place, time and situation Is this baseline? Yes   Triage Complete: Triage complete  Chief Complaint Urine Retention  Triage Note Patient arrived via [facility] from home. Patient is AOx4 and ambulatory with 2 person assist. Patient chief complaint is bladder spasms and urinary retention. Patient had blood in urinary drainage bag this morning however no urine after this morning. Patient has Bladder cancer and prostate cancer. Patient has no other complaints.    Allergies Allergies  Allergen Reactions  . Dilaudid [Hydromorphone Hcl] Hives, Itching, Rash and Other (See Comments)    Causes un-controllable shakes  . Pollen Extract Other (See Comments)    Sneezing and shortness of breath    Level of Care/Admitting Diagnosis ED Disposition    ED Disposition Condition Comment   Admit  Hospital Area: [place] [[id number]]  Level of Care: Med-Surg [16]  Covid Evaluation: [name]  Diagnosis: Acute urinary retention [921194]  Admitting Physician: Darliss Cheney [[id number]]  Attending Physician: Darliss Cheney [[id number]]  PT Class (Do Not Modify): Observation [104]  PT Acc Code (Do Not Modify): Observation [10022]       B Medical/Surgery History Past Medical History:  Diagnosis Date  . Bilateral lower extremity edema   . Chronic diastolic congestive heart failure ([place]) 07/2018  . CKD (chronic kidney disease), stage II   . DOE (dyspnea on exertion)   . Foley catheter in place   . Hematuria   . History of CVA (cerebrovascular accident) 2007   per pt no residuals  . History of DVT of lower extremity 06/2018   left common femoral vein,  started on anticoagulant , stopped due to hematuria,  IVC filter placed 08-16-2018  . History of sepsis  09/28/2018   uti  . Hypertension   . Malignant neoplasm metastatic to lung ([place]) 04/19/2018  . Port-A-Cath in place 06/21/2018  . 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 S/P IVC filter 08/16/2018  . Urinary retention 09/28/2018   CHRONIC FOLEY CATH   Past Surgical History:  Procedure Laterality Date  . CYSTOSCOPY W/ URETERAL STENT PLACEMENT Bilateral 04/13/2018   Procedure: CYSTOSCOPY WITH BILATERAL STENT REPLACEMENT;  Surgeon: Irine Seal, MD;  [facility];  Service: Urology;  Laterality: Bilateral;  . CYSTOSCOPY WITH STENT PLACEMENT Bilateral 10/19/2018   Procedure: CYSTOSCOPY WITH STENT EXCHANGE Bilateral;  Surgeon: Irine Seal, MD;  [facility];  Service: Urology;  Laterality: Bilateral;  . ENUCLEATION Left 1976   removal left eye due to Retinoplastoma  . INTRAOCULAR PROSTHESES INSERTION    . IR IMAGING GUIDED PORT INSERTION  06/21/2018  . IR IVC FILTER PLMT / S&I /IMG GUID/MOD SED  08/16/2018  . LAPAROSCOPIC INGUINAL HERNIA REPAIR Right 11-19-2010   dr d. Ninfa Linden  @[facility]    AND UMBILICAL HERNIA REPAIR  . PROSTATE BIOPSY [name] 04/13/2018   Procedure: ULTRASOUND GUIDED PROSTATE BIOPSY;  Surgeon: Irine Seal, MD;  [facility];  Service: Urology;  Laterality: [name];  . TOOTH EXTRACTION Right 04/21/2013   Procedure: EXTRACTION MOLARS;  Surgeon: [name],  DDS;  Location: [place];  Service: Oral Surgery;  Laterality: Right;  . TRANSURETHRAL RESECTION OF PROSTATE  04/13/2018   Procedure: TRANSURETHRAL RESECTION OF THE PROSTATE (TURP);  Surgeon: Irine Seal, MD;  [facility];  Service: Urology;;     A IV Location/Drains/Wounds Patient Lines/Drains/Airways Status   Active Line/Drains/Airways    Name:   Placement date:   Placement time:   Site:   Days:    Implanted Port 06/21/18 Right Chest   06/21/18    1400    Chest   253   Peripheral IV 03/01/19 Anterior;Proximal;Right Forearm   03/01/19    1137    Forearm   less than 1   Urethral Catheter Dr. Eulis Foster Straight-tip;Latex 52 Fr.   03/01/19    1648    Straight-tip;Latex   less than 1   Ureteral Drain/Stent Right ureter 6 Fr.   10/19/18    1050    Right ureter   133   Ureteral Drain/Stent Left ureter 6 Fr.   10/19/18    1054    Left ureter   133          Intake/Output Last 24 hours  Intake/Output Summary (Last 24 hours) at 03/01/2019 1816 Last data filed at 03/01/2019 1644 Gross per 24 hour  Intake 1000 ml  Output -  Net 1000 ml    Labs/Imaging Results for orders placed or performed during the hospital encounter of 03/01/19 (from the past 48 hour(s))  Comprehensive metabolic panel     Status: Abnormal   Collection Time: 03/01/19 11:54 AM  Result Value Ref Range   Sodium 136 135 - 145 mmol/L   Potassium 4.3 3.5 - 5.1 mmol/L   Chloride 100 98 - 111 mmol/L   CO2 26 22 - 32 mmol/L   Glucose, Bld 138 (H) 70 - 99 mg/dL   BUN 22 (H) 6 - 20 mg/dL   Creatinine, Ser 2.20 (H) 0.61 - 1.24 mg/dL   Calcium 8.6 (L) 8.9 - 10.3 mg/dL   Total Protein 8.8 (H) 6.5 - 8.1 g/dL   Albumin 3.1 (L) 3.5 - 5.0 g/dL   AST 18 15 - 41 U/L   ALT 12 0 - 44 U/L   Alkaline Phosphatase 60 38 - 126 U/L   Total Bilirubin 0.4 0.3 - 1.2 mg/dL   GFR calc non Af Amer 33 (L) >60 mL/min   GFR calc Af Amer 38 (L) >60 mL/min   Anion gap 10 5 - 15    Comment: Performed at [facility], [place] [address], [place], [place] [address]  CBC with Differential     Status: Abnormal   Collection Time: 03/01/19 11:54 AM  Result Value Ref Range   WBC 12.4 (H) 4.0 - 10.5 K/uL   RBC 3.47 (L) 4.22 - 5.81 MIL/uL   Hemoglobin 8.8 (L) 13.0 - 17.0 g/dL   HCT 29.5 (L) 39.0 - 52.0 %   MCV 85.0 80.0 - 100.0 fL   MCH 25.4 (L) 26.0 - 34.0 pg   MCHC 29.8 (L) 30.0 - 36.0 g/dL   RDW 20.3 (H) 11.5 - 15.5 %   Platelets 352 150  - 400 K/uL   nRBC 0.0 0.0 - 0.2 %   Neutrophils Relative % 76 %   Neutro Abs 9.4 (H) 1.7 - 7.7 K/uL   Lymphocytes Relative 9 %   Lymphs Abs 1.1 0.7 - 4.0 K/uL   Monocytes Relative 14 %   Monocytes Absolute 1.8 (H) 0.1 -  1.0 K/uL   Eosinophils Relative 0 %   Eosinophils Absolute 0.0 0.0 - 0.5 K/uL   Basophils Relative 0 %   Basophils Absolute 0.1 0.0 - 0.1 K/uL   Immature Granulocytes 1 %   Abs Immature Granulocytes 0.06 0.00 - 0.07 K/uL    Comment: Performed at [facility], [place] [address], [place], [place] [address]  Urinalysis, Routine w reflex microscopic     Status: Abnormal   Collection Time: 03/01/19  2:00 PM  Result Value Ref Range   Color, Urine YELLOW YELLOW   APPearance TURBID (A) CLEAR   Specific Gravity, Urine 1.008 1.005 - 1.030   pH 6.0 5.0 - 8.0   Glucose, UA NEGATIVE NEGATIVE mg/dL   Hgb urine dipstick LARGE (A) NEGATIVE   Bilirubin Urine NEGATIVE NEGATIVE   Ketones, ur NEGATIVE NEGATIVE mg/dL   Protein, ur >=300 (A) NEGATIVE mg/dL   Nitrite NEGATIVE NEGATIVE   Leukocytes,Ua LARGE (A) NEGATIVE   RBC / HPF >50 (H) 0 - 5 RBC/hpf   WBC, UA >50 (H) 0 - 5 WBC/hpf   Bacteria, UA NONE SEEN NONE SEEN   WBC Clumps PRESENT    Mucus PRESENT     Comment: Performed at [facility], [place] [address] [place], [place] [address]   No results found.  Pending Labs Unresulted Labs (From admission, onward)    Start     Ordered   03/01/19 1557  Novel Coronavirus,NAA,(SEND-OUT TO REF LAB - TAT 24-48 hrs); Hosp Order  (Asymptomatic Patients Labs)  Once,   STAT    Question:  Rule Out  Answer:  Yes   03/01/19 1556   Signed and Held  CBC  (enoxaparin (LOVENOX)    CrCl >/= 30 ml/min)  Once,   R    Comments: Baseline for enoxaparin therapy IF NOT ALREADY DRAWN.  Notify MD if PLT < 100 K.    Signed and Held   Signed and Held  Creatinine, serum  (enoxaparin (LOVENOX)    CrCl >/= 30 ml/min)  Once,   R    Comments: Baseline for enoxaparin therapy  IF NOT ALREADY DRAWN.    Signed and Held   Signed and Held  Creatinine, serum  (enoxaparin (LOVENOX)    CrCl >/= 30 ml/min)  Weekly,   R    Comments: while on enoxaparin therapy    Signed and Held   Signed and Held  Basic metabolic panel  Tomorrow morning,   R     Signed and Held   Signed and Held  CBC  Tomorrow morning,   R     Signed and Held          Vitals/Pain Today's Vitals   03/01/19 1500 03/01/19 1600 03/01/19 1630 03/01/19 1700  BP: (!) 147/99 (!) 164/111 (!) 160/108 (!) 158/118  Pulse: (!) 103 (!) 109 (!) 111 (!) 114  Resp: (!) 22 (!) 23 (!) 24 20  Temp:      TempSrc:      SpO2: 100% 98% 96% 94%  Weight:      Height:      PainSc:        Isolation Precautions No active isolations  Medications Medications  sodium chloride 0.9 % bolus 1,000 mL (0 mLs Intravenous Stopped 03/01/19 1644)  lidocaine (XYLOCAINE) 2 % jelly 1 application (1 application Urethral Given by Other 03/01/19 1700)    Mobility non-ambulatory Moderate fall risk   Focused Assessments Renal Assessment Handoff:  Hemodialysis Schedule:  Last Hemodialysis date and time:     Restricted appendage:  None     R Recommendations: See Admitting Provider Note  Report given to:   Additional Notes:

## 2019-03-01 NOTE — H&P (Signed)
History and Physical    Dylan Gonzalez YIF:[id number] DOB: 03/06/1966 DOA: 03/01/2019  PCP: Donald Prose, MD  Patient coming from: Home  I have personally briefly reviewed patient's old medical records in [place]  Chief Complaint: Confusion " bladder spasms"  HPI: Dylan Gonzalez is a 53 y.o. 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  Patient himself is very groggy to the point that he cannot provide any meaningful history.  The only thing he told me that he is here at [facility] for " bladder spasms".  I called his wife and was able to gather more information.  Patient in fact is under hospice care and has chronic indwelling Foley catheter.  This was changed more than a month ago however his bag was changed just yesterday.  According to wife, he was fine yesterday with some intermittent confusion.  She also took him to see the doctor yesterday but this morning when he woke up, he was significantly confused and he kept pulling on his Foley catheter and it did not drain anything for about 4 hours so she called EMS and he was brought into the emergency department.  ED Course: Upon arrival to the emergency department, patient was pretty lethargic but hemodynamically stable.  CBC showed mild leukocytosis.  BMP showed elevated creatinine more than his baseline and he was diagnosed with AKI over CKD stage III.  ED physician had discussed the case with on-call urologist who had recommended to replace his Foley catheter with larger diameter which was done in the emergency department and Foley is draining well now.  He seemed  dehydrated causing acute on chronic kidney disease so hospice nurse was contacted by ED physician and was informed that they could not give him any IV fluids at home so hospital service was consulted for admission for observation and IV fluids.  Of note, according to the wife, she believes that he is taking too many medications and does not take as prescribed.  According to her, he is on morphine 30 mg every 8 hours oxycodone 20 mg every 4 hours, Valium and Ativan.  Review of Systems: As per HPI otherwise 10 point review of systems negative.    Past Medical History:  Diagnosis Date  . Bilateral lower extremity edema   . Chronic diastolic congestive heart failure ([place]) 07/2018  . CKD (chronic kidney disease), stage II   . DOE (dyspnea on exertion)   . Foley catheter in place   . Hematuria   . History of CVA (cerebrovascular accident) 2007   per pt no residuals  . History of DVT of lower extremity 06/2018   left common femoral vein,  started on anticoagulant , stopped due to hematuria,  IVC filter placed 08-16-2018  . History of sepsis 09/28/2018   uti  . Hypertension   . Malignant neoplasm metastatic to lung ([place]) 04/19/2018  . Port-A-Cath in place 06/21/2018  . 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 S/P IVC filter 08/16/2018  . Urinary retention 09/28/2018   CHRONIC FOLEY CATH    Past Surgical History:  Procedure Laterality Date  . CYSTOSCOPY W/ URETERAL STENT PLACEMENT Bilateral 04/13/2018   Procedure: CYSTOSCOPY WITH BILATERAL STENT REPLACEMENT;  Surgeon: Irine Seal, MD;  [facility];  Service: Urology;  Laterality: Bilateral;  . CYSTOSCOPY WITH STENT PLACEMENT Bilateral 10/19/2018   Procedure: CYSTOSCOPY WITH STENT EXCHANGE Bilateral;  Surgeon: Irine Seal, MD;  [facility];  Service: Urology;  Laterality: Bilateral;  . ENUCLEATION Left 1976   removal left eye due to Retinoplastoma  . INTRAOCULAR PROSTHESES INSERTION    . IR IMAGING GUIDED PORT INSERTION  06/21/2018  . IR IVC FILTER PLMT / S&I /IMG GUID/MOD SED  08/16/2018  . LAPAROSCOPIC INGUINAL HERNIA REPAIR Right 11-19-2010   dr d. Ninfa Linden  @[facility]    AND UMBILICAL HERNIA REPAIR  . PROSTATE BIOPSY [name] 04/13/2018   Procedure: ULTRASOUND GUIDED PROSTATE BIOPSY;  Surgeon: Irine Seal, MD;  [facility];  Service: Urology;  Laterality: [name];  . TOOTH EXTRACTION Right 04/21/2013   Procedure: EXTRACTION MOLARS;  Surgeon: [name], DDS;  Location: [place];  Service: Oral Surgery;  Laterality: Right;  . TRANSURETHRAL RESECTION OF PROSTATE  04/13/2018   Procedure: TRANSURETHRAL RESECTION OF THE PROSTATE (TURP);  Surgeon: Irine Seal, MD;  [facility];  Service: Urology;;     reports that he has never smoked. He has never used smokeless tobacco. He reports that he does not drink alcohol or use drugs.  Allergies  Allergen Reactions  . Dilaudid [Hydromorphone Hcl] Hives, Itching, Rash and Other (See Comments)    Causes un-controllable shakes  . Pollen Extract Other (See Comments)    Sneezing and shortness of breath    Family History  Problem Relation Age of Onset  . Hypertension Father   . Depression Brother     Prior to Admission medications   Medication Sig Start Date End Date Taking? Authorizing Provider  albuterol (PROVENTIL HFA) 108 (90 Base) MCG/ACT inhaler Inhale 2 puffs into the lungs every 6 (six) hours as needed for wheezing or shortness of breath.    Yes [provider]  albuterol (PROVENTIL) (2.5 MG/3ML) 0.083% nebulizer solution Take 2.5 mg by nebulization every 6 (six) hours as needed for wheezing or shortness of breath.  12/11/18  Yes [provider]  diazepam (VALIUM) 5 MG tablet Take 5 mg by mouth 2 (two) times a day.    Yes [provider]  escitalopram (LEXAPRO) 10 MG tablet Take 10 mg by mouth daily.   Yes [provider]  gabapentin (NEURONTIN) 300 MG capsule Take 300 mg by mouth 3 (three) times daily.   Yes [provider]  LORazepam (ATIVAN) 0.5 MG tablet Take 0.5 mg by mouth every 4 (four) hours as needed for anxiety.  11/03/18  Yes [provider]  magnesium citrate (CITROMA) SOLN Take 1 Bottle by mouth daily as needed for severe constipation.   Yes [provider]  metoprolol tartrate (LOPRESSOR) 50 MG tablet Take 50 mg by mouth daily.    Yes [provider]  mirabegron ER (MYRBETRIQ) 25 MG TB24 tablet Take 25 mg by mouth daily.   Yes [provider]  morphine (MS CONTIN) 30 MG 12 hr tablet Take 1 tablet (30 mg total) by mouth every 12 (twelve) hours. Patient taking differently: Take 30 mg by mouth 3 (three) times daily.  10/30/18  Yes [name]  N, MD  Oxycodone HCl 20 MG TABS Take 20 mg by mouth every 4 (four) hours as needed (severe pain).   Yes [provider]  sennosides-docusate sodium (SENOKOT-S) 8.6-50 MG tablet Take 1-2 tablets by mouth See admin instructions. Take 2 tablets in the morning and 1 tablet at bedtime   Yes [provider]  fluticasone (FLONASE) 50 MCG/ACT nasal spray PLACE 2 SPRAYS INTO BOTH NOSTRILS DAILY. Patient not taking: No sig reported 07/22/14   Le, Thao P, DO  opium-belladonna (B&O SUPPRETTES) 16.2-60 MG suppository Place 1 suppository rectally every 8 (eight) hours as needed for up to 5 days for bladder spasms. Patient not taking: Reported on 03/01/2019 02/15/19 02/20/19  Alma Friendly, MD  polyethylene glycol [facility] Floria Raveling) packet Take 17 g by mouth 2 (two) times daily as needed for mild constipation or moderate constipation. Patient not taking: Reported on 03/01/2019 04/23/18   Orpah Greek, MD    Physical Exam: Vitals:   03/01/19 1200 03/01/19 1230 03/01/19 1400 03/01/19 1500  BP:  (!) 156/107 (!) 158/112 (!) 148/106 (!) 147/99  Pulse: (!) 111 (!) 110 (!) 109 (!) 103  Resp: (!) 28 (!) 30 (!) 27 (!) 22  Temp:      TempSrc:      SpO2: 100% 100% 99% 100%  Weight:      Height:        Constitutional: NAD, calm, comfortable Vitals:   03/01/19 1200 03/01/19 1230 03/01/19 1400 03/01/19 1500  BP: (!) 156/107 (!) 158/112 (!) 148/106 (!) 147/99  Pulse: (!) 111 (!) 110 (!) 109 (!) 103  Resp: (!) 28 (!) 30 (!) 27 (!) 22  Temp:      TempSrc:      SpO2: 100% 100% 99% 100%  Weight:      Height:       Eyes: PERRL, lids and conjunctivae normal ENMT: Mucous membranes are moist. Posterior pharynx clear of any exudate or lesions.Normal dentition.  Neck: normal, supple, no masses, no thyromegaly Respiratory: clear to auscultation bilaterally, no wheezing, no crackles. Normal respiratory effort. No accessory muscle use.  Cardiovascular: Regular rate and rhythm, no murmurs / rubs / gallops. No extremity edema. 2+ pedal pulses. No carotid bruits.  Abdomen: no tenderness, no masses palpated. No hepatosplenomegaly. Bowel sounds positive.  Musculoskeletal: no clubbing / cyanosis. No joint deformity upper and lower extremities. Good ROM, no contractures. Normal muscle tone.  Skin: no rashes, lesions, ulcers. No induration Neurologic: CN 2-12 grossly intact. Sensation intact, DTR normal. Strength 5/5 in all 4.  Psychiatric: Normal judgment and insight. Alert and oriented x 3. Normal mood.    Labs on Admission: I have personally reviewed following labs and imaging studies  CBC: Recent Labs  Lab 03/01/19 1154  WBC 12.4*  NEUTROABS 9.4*  HGB 8.8*  HCT 29.5*  MCV 85.0  PLT 253   Basic Metabolic Panel: Recent Labs  Lab 03/01/19 1154  NA 136  K 4.3  CL 100  CO2 26  GLUCOSE 138*  BUN 22*  CREATININE 2.20*  CALCIUM 8.6*   GFR: Estimated Creatinine Clearance: 41.4 mL/min (A) (by C-G formula based on SCr of 2.2 mg/dL (H)). Liver Function Tests: Recent Labs  Lab 03/01/19  1154  AST 18  ALT 12  ALKPHOS 60  BILITOT 0.4  PROT 8.8*  ALBUMIN 3.1*   No results for input(s): LIPASE, AMYLASE in the last 168 hours. No results for input(s): AMMONIA in the last 168 hours. Coagulation Profile: No results  for input(s): INR, PROTIME in the last 168 hours. Cardiac Enzymes: No results for input(s): CKTOTAL, CKMB, CKMBINDEX, TROPONINI in the last 168 hours. BNP (last 3 results) No results for input(s): PROBNP in the last 8760 hours. HbA1C: No results for input(s): HGBA1C in the last 72 hours. CBG: No results for input(s): GLUCAP in the last 168 hours. Lipid Profile: No results for input(s): CHOL, HDL, LDLCALC, TRIG, CHOLHDL, LDLDIRECT in the last 72 hours. Thyroid Function Tests: No results for input(s): TSH, T4TOTAL, FREET4, T3FREE, THYROIDAB in the last 72 hours. Anemia Panel: No results for input(s): VITAMINB12, FOLATE, FERRITIN, TIBC, IRON, RETICCTPCT in the last 72 hours. Urine analysis:    Component Value Date/Time   COLORURINE YELLOW 03/01/2019 1400   APPEARANCEUR TURBID (A) 03/01/2019 1400   LABSPEC 1.008 03/01/2019 1400   PHURINE 6.0 03/01/2019 1400   GLUCOSEU NEGATIVE 03/01/2019 1400   HGBUR LARGE (A) 03/01/2019 1400   BILIRUBINUR NEGATIVE 03/01/2019 1400   BILIRUBINUR neg 06/01/2014 1333   KETONESUR NEGATIVE 03/01/2019 1400   PROTEINUR >=300 (A) 03/01/2019 1400   UROBILINOGEN 0.2 04/19/2015 0907   NITRITE NEGATIVE 03/01/2019 1400   LEUKOCYTESUR LARGE (A) 03/01/2019 1400    Radiological Exams on Admission: No results found.   Assessment/Plan Active Problems:   Acute urinary retention    Acute kidney injury on chronic kidney disease/acute urinary retention: His baseline creatinine is around 1.4 but currently it is about 2.3.  This is likely secondary to dehydration and urinary retention.  We will start him on gentle hydration and repeat labs in the morning.  Foley has been replaced and is draining well.  Somnolence likely secondary to  polydrug influence: He is pretty lethargic at this point in time and I have to agree with his wife that this is likely secondary to too many opioids that he is on along with benzodiazepines.  I am going to hold all those medications and wait for him to recover.  He is protecting his airway very well and is able to talk when prompted.  Chronic diastolic CHF: Stable.  Resume home medications.  Hypertension: Controlled.  Resume home medications.  DVT prophylaxis: Lovenox Code Status: DNR, confirmed with wife Family Communication: Called his wife and discussed in length as mentioned above. Disposition Plan: Likely discharge back to home tomorrow. Consults called: None Admission status: Observation   Darliss Cheney MD [facility] Pager [phone]  If 7PM-7AM, please contact night-coverage [email or web address] Password [facility]  03/01/2019, 4:31 PM

## 2019-03-01 NOTE — ED Notes (Signed)
Bed: WA08 Expected date:  Expected time:  Means of arrival:  Comments: EMS - Cath Problem / Prostate Cancer

## 2019-03-01 NOTE — Plan of Care (Addendum)
Pt with chronic indwelling Foley which was changed in ED. On floor arrival, pt with Foley in place with small amount of blood tinged urine and complaining of bladder spasms per RN report. Bladder scan done showed 434ml. Started patient on Flomax and home Mirabegron given for spasms.  Hematuria likely from mild trauma from Foley placement. Monitor for now. Consult urology when appropriate. Monitor blood pressures while on Flomax.

## 2019-03-01 NOTE — ED Triage Notes (Signed)
Patient arrived via [facility] from home. Patient is AOx4 and ambulatory with 2 person assist. Patient chief complaint is bladder spasms and urinary retention. Patient had blood in urinary drainage bag this morning however no urine after this morning. Patient has Bladder cancer and prostate cancer. Patient has no other complaints.

## 2019-03-01 NOTE — ED Provider Notes (Signed)
Dylan Gonzalez Provider Note   CSN: [id number] Arrival date & time: 03/01/19  1128    History   Chief Complaint Chief Complaint  Patient presents with  . Urinary Retention  . Prostate Cancer    HPI Dylan Gonzalez is a 53 y.o. male.     HPI  He presents for evaluation of inability to void since early this morning.  He has a chronic Foley catheter, he noticed decreased urine output, this morning.  He was in the emergency department 2 days ago with similar symptoms, had his Foley catheter changed and discharged.  Urine culture was sent to the time, result "insignificant growth."  He has an upcoming cystoscopy scheduled for next month.  This is his third visit to the ED, this month for catheter dysfunction.  He has had 2- urine cultures this month.  He is unable to give any additional history.   Past Medical History:  Diagnosis Date  . Bilateral lower extremity edema   . Chronic diastolic congestive heart failure ([place]) 07/2018  . CKD (chronic kidney disease), stage II   . DOE (dyspnea on exertion)   . Foley catheter in place   . Hematuria   . History of CVA (cerebrovascular accident) 2007   per pt no residuals  . History of DVT of lower extremity 06/2018   left common femoral vein,  started on anticoagulant , stopped due to hematuria,  IVC filter placed 08-16-2018  . History of sepsis 09/28/2018   uti  . Hypertension   . Malignant neoplasm metastatic to lung ([place]) 04/19/2018  . Port-A-Cath in place 06/21/2018  . 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 S/P IVC filter 08/16/2018  . Urinary retention 09/28/2018   CHRONIC FOLEY CATH    Patient Active Problem List   Diagnosis Date Noted  . Acute urinary retention  03/01/2019  . Acute renal failure superimposed on stage 3 chronic kidney disease ([place]) 03/01/2019  . Chronic diastolic CHF (congestive heart failure) ([place]) 02/10/2019  . Hematuria 02/10/2019  . Dehydration 02/10/2019  . Abdominal pain 09/28/2018  . Sepsis ([place]) 09/28/2018  . Anemia 08/14/2018  . Symptomatic anemia 08/13/2018  . Acute UTI (urinary tract infection) 08/02/2018  . Deep vein thrombosis (DVT) of left lower extremity ([place]) 08/02/2018  . Bilateral lower extremity edema 08/01/2018  . CAD (coronary artery disease) 08/01/2018  . Hypokalemia 08/01/2018  . Port-A-Cath in place 06/29/2018  . Lung nodules 05/24/2018  . Leiomyosarcoma ([place]) 05/23/2018  . Goals of care, counseling/discussion 05/15/2018  . Bilateral ureteral obstruction 04/19/2018  . Malignant neoplasm metastatic to lung ([place]) 04/19/2018  . Primary leiomyosarcoma of intra-abdominal site ([place]) 04/18/2018  . Prostate neoplasm 04/13/2018  . Hypertension 12/26/2011  . 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 CYSTOSCOPY W/ URETERAL STENT PLACEMENT Bilateral 04/13/2018   Procedure: CYSTOSCOPY WITH BILATERAL STENT REPLACEMENT;  Surgeon: Irine Seal, MD;  Location: [facility];  Service: Urology;  Laterality: Bilateral;  . CYSTOSCOPY WITH STENT PLACEMENT Bilateral 10/19/2018   Procedure: CYSTOSCOPY WITH STENT EXCHANGE Bilateral;  Surgeon: Irine Seal, MD;  Location: [facility];  Service: Urology;  Laterality: Bilateral;  . ENUCLEATION Left 1976   removal left eye due to Retinoplastoma  .  INTRAOCULAR PROSTHESES INSERTION    . IR IMAGING GUIDED PORT INSERTION  06/21/2018  . IR IVC FILTER PLMT / S&I /IMG GUID/MOD SED  08/16/2018  . LAPAROSCOPIC INGUINAL HERNIA REPAIR Right 11-19-2010   dr d. Ninfa Linden  @[facility]    AND UMBILICAL HERNIA REPAIR  . PROSTATE BIOPSY [name] 04/13/2018   Procedure: ULTRASOUND GUIDED PROSTATE BIOPSY;  Surgeon: Irine Seal, MD;  Location: [facility];  Service:  Urology;  Laterality: [name];  . TOOTH EXTRACTION Right 04/21/2013   Procedure: EXTRACTION MOLARS;  Surgeon: [name], DDS;  Location: [place];  Service: Oral Surgery;  Laterality: Right;  . TRANSURETHRAL RESECTION OF PROSTATE  04/13/2018   Procedure: TRANSURETHRAL RESECTION OF THE PROSTATE (TURP);  Surgeon: Irine Seal, MD;  Location: [facility];  Service: Urology;;        Home Medications    Prior to Admission medications   Medication Sig Start Date End Date Taking? Authorizing Provider  albuterol (PROVENTIL HFA) 108 (90 Base) MCG/ACT inhaler Inhale 2 puffs into the lungs every 6 (six) hours as needed for wheezing or shortness of breath.    Yes [provider]  albuterol (PROVENTIL) (2.5 MG/3ML) 0.083% nebulizer solution Take 2.5 mg by nebulization every 6 (six) hours as needed for wheezing or shortness of breath.  12/11/18  Yes [provider]  diazepam (VALIUM) 5 MG tablet Take 5 mg by mouth 2 (two) times a day.    Yes [provider]  escitalopram (LEXAPRO) 10 MG tablet Take 10 mg by mouth daily.   Yes [provider]  gabapentin (NEURONTIN) 300 MG capsule Take 300 mg by mouth 3 (three) times daily.   Yes [provider]  LORazepam (ATIVAN) 0.5 MG tablet Take 0.5 mg by mouth every 4 (four) hours as needed for anxiety.  11/03/18  Yes [provider]  magnesium citrate (CITROMA) SOLN Take 1 Bottle by mouth daily as needed for severe constipation.   Yes [provider]  metoprolol tartrate (LOPRESSOR) 50 MG tablet Take 50 mg by mouth daily.    Yes [provider]  mirabegron ER (MYRBETRIQ) 25 MG TB24 tablet Take 25 mg by mouth daily.   Yes [provider]  morphine (MS CONTIN) 30 MG 12 hr tablet Take 1 tablet (30 mg total) by mouth every 12 (twelve) hours. Patient taking differently: Take 30 mg by mouth 3 (three) times daily.  10/30/18  Yes [name], MD  Oxycodone HCl 20 MG TABS Take 20 mg by mouth every 4 (four)  hours as needed (severe pain).   Yes [provider]  sennosides-docusate sodium (SENOKOT-S) 8.6-50 MG tablet Take 1-2 tablets by mouth See admin instructions. Take 2 tablets in the morning and 1 tablet at bedtime   Yes [provider]  fluticasone (FLONASE) 50 MCG/ACT nasal spray PLACE 2 SPRAYS INTO BOTH NOSTRILS DAILY. Patient not taking: No sig reported 07/22/14   Le, Thao P, DO  opium-belladonna (B&O SUPPRETTES) 16.2-60 MG suppository Place 1 suppository rectally every 8 (eight) hours as needed for up to 5 days for bladder spasms. Patient not taking: Reported on 03/01/2019 02/15/19 02/20/19  Alma Friendly, MD  polyethylene glycol [facility] / Floria Raveling) packet Take 17 g by mouth 2 (two) times daily as needed for mild constipation or moderate constipation. Patient not taking: Reported on 03/01/2019 04/23/18   Orpah Greek, MD    Family History Family History  Problem Relation Age of Onset  . Hypertension Father   . Depression Brother  Social History Social History   Tobacco Use  . Smoking status: Never Smoker  . Smokeless tobacco: Never Used  Substance Use Topics  . Alcohol use: No  . Drug use: Never     Allergies   Dilaudid [hydromorphone hcl] and Pollen extract   Review of Systems Review of Systems  Unable to perform ROS: Mental status change     Physical Exam Updated Vital Signs BP (!) 160/108   Pulse (!) 111   Temp 98.6 F (37 C) (Oral)   Resp (!) 24   Ht 5\' 11"  (1.803 m)   Wt 89.9 kg   SpO2 96%   BMI 27.64 kg/m   Physical Exam Vitals signs and nursing note reviewed.  Constitutional:      General: He is not in acute distress.    Appearance: He is well-developed. He is not ill-appearing, toxic-appearing or diaphoretic.  HENT:     Head: Normocephalic and atraumatic.     Right Ear: External ear normal.     Left Ear: External ear normal.  Eyes:     Conjunctiva/sclera: Conjunctivae normal.     Pupils: Pupils are equal,  round, and reactive to light.  Neck:     Musculoskeletal: Normal range of motion and neck supple.     Trachea: Phonation normal.  Cardiovascular:     Rate and Rhythm: Normal rate.  Pulmonary:     Effort: Pulmonary effort is normal.  Abdominal:     Palpations: Abdomen is soft. There is mass (Letter palpable above pubic symphysis).     Tenderness: There is abdominal tenderness (Suprapubic, moderate). There is no guarding or rebound.     Hernia: No hernia is present.  Musculoskeletal: Normal range of motion.  Skin:    General: Skin is warm and dry.  Neurological:     Mental Status: He is alert.     Cranial Nerves: No cranial nerve deficit.     Sensory: No sensory deficit.     Motor: No abnormal muscle tone.     Coordination: Coordination normal.  Psychiatric:        Mood and Affect: Mood normal.        Behavior: Behavior normal.      ED Treatments / Results  Labs (all labs ordered are listed, but only abnormal results are displayed) Labs Reviewed  COMPREHENSIVE METABOLIC PANEL - Abnormal; Notable for the following components:      Result Value   Glucose, Bld 138 (*)    BUN 22 (*)    Creatinine, Ser 2.20 (*)    Calcium 8.6 (*)    Total Protein 8.8 (*)    Albumin 3.1 (*)    GFR calc non Af Amer 33 (*)    GFR calc Af Amer 38 (*)    All other components within normal limits  CBC WITH DIFFERENTIAL/PLATELET - Abnormal; Notable for the following components:   WBC 12.4 (*)    RBC 3.47 (*)    Hemoglobin 8.8 (*)    HCT 29.5 (*)    MCH 25.4 (*)    MCHC 29.8 (*)    RDW 20.3 (*)    Neutro Abs 9.4 (*)    Monocytes Absolute 1.8 (*)    All other components within normal limits  URINALYSIS, ROUTINE W REFLEX MICROSCOPIC - Abnormal; Notable for the following components:   APPearance TURBID (*)    Hgb urine dipstick LARGE (*)    Protein, ur >=300 (*)    Leukocytes,Ua LARGE (*)  RBC / HPF >50 (*)    WBC, UA >50 (*)    All other components within normal limits  NOVEL  CORONAVIRUS, NAA (HOSPITAL ORDER, SEND-OUT TO REF LAB)    EKG None  Radiology No results found.  Procedures BLADDER CATHETERIZATION  Date/Time: 03/01/2019 4:45 PM Performed by: Daleen Bo, MD Authorized by: Daleen Bo, MD   Consent:    Consent obtained:  Verbal   Consent given by:  Patient   Risks discussed:  Pain   Alternatives discussed:  No treatment Pre-procedure details:    Procedure purpose:  Therapeutic   Preparation: Patient was prepped and draped in usual sterile fashion   Anesthesia (see MAR for exact dosages):    Anesthesia method:  Topical application   Topical anesthetic:  Lidocaine gel Procedure details:    Provider performed due to:  Altered anatomy   Altered anatomy details: Hypospadias.   Catheter insertion:  Indwelling   Catheter type:  Foley   Catheter size:  22 Fr   Bladder irrigation: no     Number of attempts:  1   Urine appearance: No urine obtained immediately, previous catheter removed immediately prior to procedure. Post-procedure details:    Patient tolerance of procedure:  Tolerated well, no immediate complications Comments:     Foley catheter balloon inflated with 10 cc of saline, without pain. .Critical Care Performed by: Daleen Bo, MD Authorized by: Daleen Bo, MD   Critical care provider statement:    Critical care time (minutes):  35   Critical care start time:  03/01/2019 12:10 PM   Critical care end time:  03/01/2019 4:49 PM   Critical care time was exclusive of:  Separately billable procedures and treating other patients   Critical care was necessary to treat or prevent imminent or life-threatening deterioration of the following conditions:  Circulatory failure   Critical care was time spent personally by me on the following activities:  Blood draw for specimens, development of treatment plan with patient or surrogate, discussions with consultants, evaluation of patient's response to treatment, examination of patient,  obtaining history from patient or surrogate, ordering and performing treatments and interventions, ordering and review of laboratory studies, pulse oximetry, re-evaluation of patient's condition, review of old charts and ordering and review of radiographic studies   (including critical care time)  Medications Ordered in ED Medications  lidocaine (XYLOCAINE) 2 % jelly 1 application (has no administration in time range)  sodium chloride 0.9 % bolus 1,000 mL (0 mLs Intravenous Stopped 03/01/19 1644)     Initial Impression / Assessment and Plan / ED Course  I have reviewed the triage vital signs and the nursing notes.  Pertinent labs & imaging results that were available during my care of the patient were reviewed by me and considered in my medical decision making (see chart for details).  Clinical Course as of Mar 01 1655  Thu Mar 01, 2019  1433 Normal, chronic indwelling catheter sample; presence of turbidity, hemoglobin, protein, leukocytes, RBCs, WBCs  Urinalysis, Routine w reflex microscopic(!) [EW]  1441 Normal except white count high hemoglobin low, RBC indices low  CBC with Differential(!) [EW]  1442 I discussed the case with a hospice liaison, who is familiar with his case.  Typically the patient is alert and involved in his care, able to manage his functions at home with the assistance of his wife.  The patient has been on hospice/comfort care for several months.  She notes that he recently had his Myrbetriq dose increased  from 25 to 50 mg each day.  She stated that his primary caregiver is his wife, whose name is Ripley Fraise.  Her phone number is [phone].   [EW]  1521 I discussed the case with his urologist, Dr. Jeffie Pollock, who advises that to improve urinary outflow, replace his catheter with a larger size, 22 [place].    [EW]  1522 Normal except glucose high, BUN high, creatinine high, calcium low, total protein high, albumin low, GFR low  Comprehensive metabolic panel(!) [EW]  6803  I discussed the current case situation with his wife Dionne.  He reports that he is taking his medication sporadically, and she feels his narcotic medication is too strong.  She has been advised to give him morphine 30 mg 3 times a day, and use oxycodone 20 mg every 4 hours as needed pain.  She feels that he is weak, at risk for falling, and confused.  He is different in the last day, from his baseline.  This information is consistent with what hospice told me earlier, regarding his usual mental status as being alert and able to manage his affairs.   [EW]    Clinical Course User Index [EW] Daleen Bo, MD        Patient Vitals for the past 24 hrs:  BP Temp Temp src Pulse Resp SpO2 Height Weight  03/01/19 1630 (!) 160/108 - - (!) 111 (!) 24 96 % - -  03/01/19 1600 (!) 164/111 - - (!) 109 (!) 23 98 % - -  03/01/19 1500 (!) 147/99 - - (!) 103 (!) 22 100 % - -  03/01/19 1400 (!) 148/106 - - (!) 109 (!) 27 99 % - -  03/01/19 1230 (!) 158/112 - - (!) 110 (!) 30 100 % - -  03/01/19 1200 (!) 156/107 - - (!) 111 (!) 28 100 % - -  03/01/19 1150 (!) 158/106 98.6 F (37 C) Oral (!) 112 (!) 25 97 % - -  03/01/19 1141 - - - - - - 5\' 11"  (1.803 m) 89.9 kg  03/01/19 1137 - - - - - 99 % - -    4:46 PM Reevaluation with update and discussion. After initial assessment and treatment, an updated evaluation reveals patient is alert and comfortable at this time.  He still remains groggy and has slurred speech.Daleen Bo   Medical Decision Making: Urinary retention, related to mass, likely discharge and debris into the urinary bladder.  Patient with significant elevation of creatinine likely related to transient bladder obstruction.  He appears oversedated, at this time, will need require modification of his narcotic dosing.  Doubt serious bacterial infection, metabolic instability or impending vascular collapse.  CRITICAL CARE-yes Performed by: Daleen Bo  Nursing Notes Reviewed/ Care Coordinated  Applicable Imaging Reviewed Interpretation of Laboratory Data incorporated into ED treatment  4:07 PM-Consult complete with hospitalist. Patient case explained and discussed.  He agrees to admit patient for further evaluation and treatment. Call ended at 4:20 PM  Final Clinical Impressions(s) / ED Diagnoses   Final diagnoses:  Urinary retention  Prostate cancer ([place])  Acute kidney injury (AKI) with acute tubular necrosis (ATN) ([place])  Confusion  DNR (do not resuscitate)  Slurred speech    ED Discharge Orders    None       Daleen Bo, MD 03/01/19 1656

## 2019-03-01 NOTE — Progress Notes (Signed)
[facility] ED 8 - Manufacturing engineer [facility]) - RN Note  Patient on [place] service and was Notified by [facility] on call service that patient was to be seen today for complaints of bladder spasms and urine retention but called EMS before nurse visit today. Patient arrived at [facility] ED at 1128 this morning.   Spoke to Wife who stated that she was concerned about 4 hours without urine from Foley Catheter and increased confusion noted in patient since last night so wanted him to be evaluated immediately at the hospital.  Wife states that they had a MD office visit yesterday to place a new Foley and it was draining well until husband became agitated/confused through the night and repeatedly pulled on the catheter. Wife also states that he has had increased confusion at night but is mostly oriented and follows directions  during the daytime. And wife also verbalized that patient is not always compliant with taking medications as scheduled/refusing at times.  Supported and listened to pt wife then informed her that we would continue to follow pt during hospitalization.  [place] team updated on patient status  Spoke to ER MD to update on current medication list, answer basic questions from [facility] clinical notes. ER MD to call [facility] MD about patient and current goals of care.  Please call with hospice related questions,  Thank you, Gar Ponto, RN [facility] Liaison (listed on [place]) [phone]

## 2019-03-02 DIAGNOSIS — C499 Malignant neoplasm of connective and soft tissue, unspecified: Secondary | ICD-10-CM

## 2019-03-02 DIAGNOSIS — D4959 Neoplasm of unspecified behavior of other genitourinary organ: Secondary | ICD-10-CM

## 2019-03-02 DIAGNOSIS — N183 Chronic kidney disease, stage 3 (moderate): Secondary | ICD-10-CM

## 2019-03-02 DIAGNOSIS — R338 Other retention of urine: Secondary | ICD-10-CM

## 2019-03-02 DIAGNOSIS — N390 Urinary tract infection, site not specified: Secondary | ICD-10-CM

## 2019-03-02 DIAGNOSIS — N170 Acute kidney failure with tubular necrosis: Secondary | ICD-10-CM

## 2019-03-02 DIAGNOSIS — R410 Disorientation, unspecified: Secondary | ICD-10-CM

## 2019-03-02 DIAGNOSIS — N179 Acute kidney failure, unspecified: Secondary | ICD-10-CM

## 2019-03-02 LAB — BASIC METABOLIC PANEL
Anion gap: 7 (ref 5–15)
BUN: 29 mg/dL — ABNORMAL HIGH (ref 6–20)
CO2: 31 mmol/L (ref 22–32)
Calcium: 8.1 mg/dL — ABNORMAL LOW (ref 8.9–10.3)
Chloride: 102 mmol/L (ref 98–111)
Creatinine, Ser: 2.24 mg/dL — ABNORMAL HIGH (ref 0.61–1.24)
GFR calc Af Amer: 37 mL/min — ABNORMAL LOW (ref >60)
GFR calc non Af Amer: 32 mL/min — ABNORMAL LOW (ref >60)
Glucose, Bld: 175 mg/dL — ABNORMAL HIGH (ref 70–99)
Potassium: 4.5 mmol/L (ref 3.5–5.1)
Sodium: 140 mmol/L (ref 135–145)

## 2019-03-02 LAB — CBC
HCT: 27 % — ABNORMAL LOW (ref 39.0–52.0)
Hemoglobin: 8.1 g/dL — ABNORMAL LOW (ref 13.0–17.0)
MCH: 25.6 pg — ABNORMAL LOW (ref 26.0–34.0)
MCHC: 30 g/dL (ref 30.0–36.0)
MCV: 85.4 fL (ref 80.0–100.0)
Platelets: 271 10*3/uL (ref 150–400)
RBC: 3.16 MIL/uL — ABNORMAL LOW (ref 4.22–5.81)
RDW: 20.2 % — ABNORMAL HIGH (ref 11.5–15.5)
WBC: 11.9 10*3/uL — ABNORMAL HIGH (ref 4.0–10.5)
nRBC: 0 % (ref 0.0–0.2)

## 2019-03-02 MED ORDER — SODIUM CHLORIDE 0.9 % IV SOLN
1.0000 g | Freq: Every day | INTRAVENOUS | Status: DC
  Administered 2019-03-02: 1 g via INTRAVENOUS
  Filled 2019-03-02: qty 1

## 2019-03-02 MED ORDER — SODIUM CHLORIDE 0.9 % IV SOLN
1.0000 g | Freq: Two times a day (BID) | INTRAVENOUS | Status: DC
  Administered 2019-03-02 – 2019-03-03 (×3): 1 g via INTRAVENOUS
  Filled 2019-03-02 (×4): qty 1

## 2019-03-02 NOTE — Plan of Care (Addendum)
Consulted Urology for urinary retention and came to see pt.  -Ordered Urine Cx and Ceftriaxone per their recommendation.

## 2019-03-02 NOTE — Progress Notes (Signed)
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  Patient has been followed by hospice team outpatient.  Urology team was consulted who performed exchange of the catheter and recommended IV antibiotics and fluids.   Assessment & Plan:   Active Problems:   Hypertension   Prostate neoplasm   Primary leiomyosarcoma of intra-abdominal site [facility])   Leiomyosarcoma ([place])   Acute urinary retention   Acute renal failure superimposed on stage 3 chronic kidney disease (HCC)  Sepsis secondary to urinary tract infection from chronic indwelling Foley, POA Malfunctioning Foley, status post placed 6/26 - After reviewing the previous cultures, will change antibiotics to cefepime. -Continue IV fluids, monitor urine output -Follow-up culture data. -Status post Foley exchange 6/26 - Medication for bladder spasm ordered.  Acute kidney injury on CKD stage III -Baseline creatinine 1.4.  Still elevated at 2.4. gentle hydration.  Avoid nephrotoxic drugs.  Slightly drowsy, improved -Suspect secondary to multiple sedative medications.  Will talk about lowering the dose at the time of discharge.  He is followed by hospice outpatient  History of prostate cancer/leiomyosarcoma -He is on hospice outpatient.  Can continue to follow-up on discharge.  Chronic diastolic congestive heart failure -Resume home meds  Essential hypertension -Resume home meds.  Holding output nephrotoxic drugs.   DVT prophylaxis: Lovenox Code Status: DNR Family Communication: None at bedside Disposition Plan: Maintain  hospital stay for IV antibiotics, further culture data resolved and IV fluids.  Unsafe for discharge.  Consultants:   Urology  Procedures:   Exchange of Foley catheter 03/02/2019  Antimicrobials:   Ceftriaxone for 1 day  Cefepime 6/26-   Subjective: Feels a little better this morning after getting his Foley catheter removed.  Still little drowsy but no other complaints.  Answers all the questions appropriately.  Review of Systems Otherwise negative except as per HPI, including: General = no fevers, chills, dizziness, malaise, fatigue HEENT/EYES = negative for pain, redness, loss of vision, double vision, blurred vision, loss of hearing, sore throat, hoarseness, dysphagia Cardiovascular= negative for chest pain, palpitation, murmurs, lower extremity swelling Respiratory/lungs= negative for shortness of breath, cough, hemoptysis, wheezing, mucus production Gastrointestinal= negative for nausea, vomiting,, abdominal pain, melena, hematemesis Genitourinary= negative for Dysuria, Hematuria, Change in Urinary Frequency MSK = Negative for arthralgia, myalgias, Back Pain, Joint swelling  Neurology= Negative for headache, seizures, numbness, tingling  Psychiatry= Negative for anxiety, depression, suicidal and homocidal ideation Allergy/Immunology= Medication/Food allergy as listed  Skin= Negative for Rash, lesions, ulcers, itching  ID: Denies sick contacts, exotic exposures, travel  Objective: Vitals:   03/02/19 0620 03/02/19 0637 03/02/19 0807 03/02/19 0808  BP: 110/76  127/82 127/82  Pulse: (!) 102  (!) 105 (!) 105  Resp: 20  20 20   Temp: 98.1 F (36.7 C)  98.7 F (37.1 C) 98.7 F (37.1 C)  TempSrc: Tympanic  Oral Oral  SpO2: 98%  97% 97%  Weight:  87.2 kg    Height:        Intake/Output Summary (Last 24 hours) at 03/02/2019 1141 Last data filed at 03/02/2019 0812 Gross per 24 hour  Intake 2022.57 ml  Output 850 ml  Net 1172.57  ml   Filed Weights   03/01/19 1141  03/01/19 1848 03/02/19 0637  Weight: 89.9 kg 89.6 kg 87.2 kg    Examination:  General exam: Drowsy but easily arousable to his name.  Elderly frail appearing.  Chronically ill. Respiratory system: Clear to auscultation. Respiratory effort normal. Cardiovascular system: S1 & S2 heard, RRR. No JVD, murmurs, rubs, gallops or clicks. No pedal edema. Gastrointestinal system: Abdomen is nondistended, soft and nontender. No organomegaly or masses felt. Normal bowel sounds heard. Central nervous system: Alert and oriented. No focal neurological deficits. Extremities: Symmetric 4 x 5 power. Skin: No rashes, lesions or ulcers Psychiatry: Judgement and insight appear normal. Mood & affect appropriate.  Chronic Foley catheter in place, exchanged 03/02/2019.   Data Reviewed:   CBC: Recent Labs  Lab 03/01/19 1154 03/02/19 0426  WBC 12.4* 11.9*  NEUTROABS 9.4*  --   HGB 8.8* 8.1*  HCT 29.5* 27.0*  MCV 85.0 85.4  PLT 352 937   Basic Metabolic Panel: Recent Labs  Lab 03/01/19 1154 03/02/19 0426  NA 136 140  K 4.3 4.5  CL 100 102  CO2 26 31  GLUCOSE 138* 175*  BUN 22* 29*  CREATININE 2.20* 2.24*  CALCIUM 8.6* 8.1*   GFR: Estimated Creatinine Clearance: 40.6 mL/min (A) (by C-G formula based on SCr of 2.24 mg/dL (H)). Liver Function Tests: Recent Labs  Lab 03/01/19 1154  AST 18  ALT 12  ALKPHOS 60  BILITOT 0.4  PROT 8.8*  ALBUMIN 3.1*   No results for input(s): LIPASE, AMYLASE in the last 168 hours. No results for input(s): AMMONIA in the last 168 hours. Coagulation Profile: No results for input(s): INR, PROTIME in the last 168 hours. Cardiac Enzymes: No results for input(s): CKTOTAL, CKMB, CKMBINDEX, TROPONINI in the last 168 hours. BNP (last 3 results) No results for input(s): PROBNP in the last 8760 hours. HbA1C: No results for input(s): HGBA1C in the last 72 hours. CBG: No results for input(s): GLUCAP in the last 168 hours. Lipid Profile: No results for input(s):  CHOL, HDL, LDLCALC, TRIG, CHOLHDL, LDLDIRECT in the last 72 hours. Thyroid Function Tests: No results for input(s): TSH, T4TOTAL, FREET4, T3FREE, THYROIDAB in the last 72 hours. Anemia Panel: No results for input(s): VITAMINB12, FOLATE, FERRITIN, TIBC, IRON, RETICCTPCT in the last 72 hours. Sepsis Labs: No results for input(s): PROCALCITON, LATICACIDVEN in the last 168 hours.  Recent Results (from the past 240 hour(s))  Urine Culture     Status: Abnormal   Collection Time: 02/25/19  3:52 AM   Specimen: Urine, Random  Result Value Ref Range Status   Specimen Description   Final    URINE, RANDOM Performed at [facility] [address], [place], [place] [address]    Special Requests   Final    NONE Performed at [facility], [place] [address], [place], [place] [address]    Culture (A)  Final    <10,000 COLONIES/mL INSIGNIFICANT GROWTH Performed at [facility] [address], [place], [place] [address]    Report Status 02/26/2019 FINAL  Final         Radiology Studies: No results found.      Scheduled Meds: . enoxaparin (LOVENOX) injection  40 mg Subcutaneous Q24H  . escitalopram  10 mg Oral Daily  . feeding supplement (ENSURE ENLIVE)  237 mL Oral BID BM  . gabapentin  300 mg Oral TID  . metoprolol tartrate  50 mg Oral Daily  . mirabegron  ER  25 mg Oral Daily  . senna-docusate  1 tablet Oral QHS  . senna-docusate  2 tablet Oral Daily  . sodium chloride flush  3 mL Intravenous Q12H  . tamsulosin  0.4 mg Oral QPC supper   Continuous Infusions: . sodium chloride 125 mL/hr at 03/02/19 0831  . ceFEPime (MAXIPIME) IV 1 g (03/02/19 1042)     LOS: 0 days   Time spent= 35 mins    Kyleen Villatoro Arsenio Loader, MD [facility]  If 7PM-7AM, please contact night-coverage [email or web address] 03/02/2019, 11:41 AM

## 2019-03-02 NOTE — Progress Notes (Signed)
Foley catheter irrigated with 30 ml NS without any return.  B.Kyere notified.  Awaiting any new orders.

## 2019-03-02 NOTE — Progress Notes (Signed)
Pharmacy Antibiotic Note  Dylan Gonzalez is a 53 y.o. male admitted on 03/01/2019 with acute urinary retention in setting of malfunctioning chronic indwelling Foley. Urology replaced catheter and recommends empirically treating for UTI.  Was initially started on empiric ceftriaxone but most recent positive urine culture (02/09/19) grew Pseudomonas putida S to cefepime.  [place] attending MD issued orders to change abx to cefepime with pharmacy dosing assistance.  Baseline SCr ~ 1.4, currently is 2.24.  Plan: Cefepime 1 gram IV q12h (adjusted for renal insufficiency) Follow cultures, renal function, clinical course, duration of therapy  Height: 5\' 11"  (180.3 cm) Weight: 192 lb 3.2 oz (87.2 kg) IBW/kg (Calculated) : 75.3  Temp (24hrs), Avg:98.6 F (37 C), Min:98 F (36.7 C), Max:99.6 F (37.6 C)  Recent Labs  Lab 03/01/19 1154 03/02/19 0426  WBC 12.4* 11.9*  CREATININE 2.20* 2.24*    Estimated Creatinine Clearance: 40.6 mL/min (A) (by C-G formula based on SCr of 2.24 mg/dL (H)).    Allergies  Allergen Reactions  . Dilaudid [Hydromorphone Hcl] Hives, Itching, Rash and Other (See Comments)    Causes un-controllable shakes  . Pollen Extract Other (See Comments)    Sneezing and shortness of breath    Antimicrobials this admission: 6/26 ceftriaxone x 1 dose 6/26 cefepime >>   Dose adjustments this admission:  Microbiology results: 6/26 urine: collected 6/25 NovelCoronavirus,NAA sendout: collected  Thank you for allowing pharmacy to be a part of this patient's care.  Clayburn Pert, PharmD, BCPS [phone] 03/02/2019  8:43 AM

## 2019-03-02 NOTE — Progress Notes (Addendum)
[facility]) Hospital Liaison RN note  This a related and non-covered GIP admission of 03/01/2019 with [facility]) diagnosis of prostate cancer per Dr. Tomasa Hosteller of [facility]. Patient is a DNR. Prior to admission, Patient wife had set up a [facility] nurse visit on 03/01/19 to see patient with concerns of increased confusion, bladder spasms and no urine output for several hours but before [facility] RN arrived for visit, wife activated EMS and pt was transported to [facility] ED for evaluation. Pt was then admitted to the hospital on 03/01/19 for Urinary retention.   Due to Covid 19 visitation restrictions in place at [facility], unable to visit patient in the hospital. Via telephone, called bedside RN Hinton Dyer for report who was unavailable so left msge with Nurse Station for callback. Spoke with patient by phone this morning who stated he had a rough night and was very tired. Patient rates his discomfort at a 4 on a scale of 10 adding that he felt like his pain is being addressed well in the hospital.  Patient is hopeful that he will be discharged this weekend.  Supported and listened to concerns. Pt appreciative of call and ongoing support of Hospice.  VS: 98.7, 127/82, 95, 20, 97% on 3L O2 via [place] Labs: Glucose 175, BUN 29, Creatinine 2.24, Calcium 8.1, GFR 37, WBC 11.9,RBC 3.16, Hgb 8.1, HCT 27.0, MCH 25.6, MCHC 29.7, RDW 20.2,NEUT 9.4, Mono 1.8  Medications: Lexapro 10mg  tab QD, Neurontin 300mg  Cap TID, Lopressor 50mg  tab QD, Myrbetriq 25mg  QD, Flomax cap 0.4mg  QD, NS IVF infusing at 148ml/hr, Maxipme 1g in NS 172ml IVPB q12hr, No prns administered this shift.   Per Epic MD notes: Hospitalist Note: Active Problems:  Acute urinary retention Acute kidney injury on chronic kidney disease/acute urinary retention: His baseline creatinine is around 1.4 but currently it is about 2.3.  This is likely secondary to dehydration and urinary retention.  We will start him on gentle hydration and repeat labs  in the morning.  Foley has been replaced and is draining well. Somnolence likely secondary to polydrug influence: He is pretty lethargic at this point in time and I have to agree with his wife that this is likely secondary to too many opioids that he is on along with benzodiazepines.  I am going to hold all those medications and wait for him to recover.  He is protecting his airway very well and is able to talk when prompted. Chronic diastolic CHF: Stable.  Resume home medications. Hypertension: Controlled.  Resume home medications   Urology Consult :53 y.o. male with metastatic prostate cancer.  Existing Foley catheter was likely in the prostatic urethra which prevented it from draining the bladder.  Able to scope in a 29 [place] council tip catheter in order to drain the bladder.  Significant debris from tumor burden and possible infection upon placement.  -Recommend sending urine culture and empirically treating for urinary tract infection - Belladonna and opium suppositories every 8 as needed for bladder spasm -No further intervention given hospice status.  Recommend changing existing Foley catheter over a wire every 4 weeks.  Pharmacy Antibiotic Note: REVIN CORKER is a 53 y.o. male admitted on 03/01/2019 with acute urinary retention in setting of malfunctioning chronic indwelling Foley. Urology replaced catheter and recommends empirically treating for UTI.  Was initially started on empiric ceftriaxone but most recent positive urine culture (02/09/19) grew Pseudomonas putida S to cefepime.  [place] attending MD issued orders to change abx to cefepime with pharmacy  dosing assistance.  Baseline SCr ~ 1.4, currently is 2.24.   Plan: Cefepime 1 gram IV q12h (adjusted for renal insufficiency) Follow cultures, renal function, clinical course, duration of therapy  PCG: Was able to speak to pt wife via telephone this morning who stated that she felt pt was more oriented but slow to answer her when she  spoke to him on the phone earlier today. Emotionally supported wife and let her know [facility] would be following pt daily during hospitalization.  IDG:[facility] team updated  GOC: Pt is a DNR, [facility] will continue to support patient with EOL comfort care.  [facility] will continue to follow while in-patient.   Please use [facility] ([phone]) for ambulance transport if needed at time at discharge.   Please call with any hospice related questions/concerns.  Thank you,  Gar Ponto, RN [facility] Liaison (which are now found on [place]) [phone]   Update: AOR Dr. Alen Blew notified of admission

## 2019-03-02 NOTE — Consult Note (Addendum)
Urology Consult Note   Requesting Attending Physician:  Darliss Cheney, MD Service Providing Consult: Urology  Consulting Attending: Junious Silk  Reason for Consult:  Urinary retention   HPI: Dylan Gonzalez is seen in consultation for reasons noted above at the request of Darliss Cheney, MD for evaluation of urinary retention.  This is a 53 y.o. male with history of metastatic prostate cancer on hospice who presented to the emergency room on 6/25 with a nondraining catheter and concern for urinary tract infection.  Admitted to the hospitalist team and the catheter was changed upon admission.  However there is no documented output since placement and night shift nurse noted no output over the last 7 hrs. Urology was consulted for bladder scan showing greater than 500 cc with no urine output over the last 8 hours.  Attempted to flush 22 French catheter that was placed in the emergency room.  Catheter did not flush easily indicating it was not in the proper place.  Catheter was removed.  I then attempted catheterization with a 22 French coud catheter but was unable to enter into the bladder.  This was likely due to advanced prostate cancer with a large prostatic mass.  Decision was made to proceed with cystourethroscopy with direct vision Foley catheter placement.  Discussed risks and benefits with patient and he agreed to procedure. After sterile prepping and draping a 16 French flexible cystoscope was inserted into the urethra.  Cystourethroscopy was notable for debris, blood clot in both the prostatic urethra and the bladder.  We were able to navigate into the bladder and passed a wire under direct visualization into the bladder.  Cystoscope was removed and a 22 [place] council tip catheter was advanced over the wire into the bladder with return of dark yellow urine with significant debris.  15 cc of sterile water were placed in the balloon.  Past Medical History: Past Medical History:  Diagnosis  Date  . Bilateral lower extremity edema   . Chronic diastolic congestive heart failure ([place]) 07/2018  . CKD (chronic kidney disease), stage II   . DOE (dyspnea on exertion)   . Foley catheter in place   . Hematuria   . History of CVA (cerebrovascular accident) 2007   per pt no residuals  . History of DVT of lower extremity 06/2018   left common femoral vein,  started on anticoagulant , stopped due to hematuria,  IVC filter placed 08-16-2018  . History of sepsis 09/28/2018   uti  . Hypertension   . Malignant neoplasm metastatic to lung ([place]) 04/19/2018  . Port-A-Cath in place 06/21/2018  . Prostate cancer metastatic to lung [facility] urologist-  dr wrenn/  oncologist-- dr Alen Blew   dx 08/ 2019,  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,  started chemo therapy 05-30-2018  . 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 S/P IVC filter 08/16/2018  . Urinary retention 09/28/2018   CHRONIC FOLEY CATH    Past Surgical History:  Past Surgical History:  Procedure Laterality Date  . CYSTOSCOPY W/ URETERAL STENT PLACEMENT Bilateral 04/13/2018   Procedure: CYSTOSCOPY WITH BILATERAL STENT REPLACEMENT;  Surgeon: Irine Seal, MD;  Location: [facility];  Service: Urology;  Laterality: Bilateral;  . CYSTOSCOPY WITH STENT PLACEMENT Bilateral 10/19/2018   Procedure: CYSTOSCOPY WITH STENT EXCHANGE Bilateral;  Surgeon: Irine Seal, MD;  Location: [facility];  Service: Urology;  Laterality: Bilateral;  . ENUCLEATION Left 1976   removal left eye due to Retinoplastoma  .  INTRAOCULAR PROSTHESES INSERTION    . IR IMAGING GUIDED PORT INSERTION  06/21/2018  . IR IVC FILTER PLMT / S&I /IMG GUID/MOD SED  08/16/2018  . LAPAROSCOPIC INGUINAL HERNIA REPAIR Right 11-19-2010   dr d. Ninfa Linden  @[facility]    AND UMBILICAL HERNIA REPAIR  . PROSTATE BIOPSY [name] 04/13/2018   Procedure: ULTRASOUND GUIDED PROSTATE BIOPSY;  Surgeon: Irine Seal, MD;  Location: [facility];  Service: Urology;  Laterality: [name];  .  TOOTH EXTRACTION Right 04/21/2013   Procedure: EXTRACTION MOLARS;  Surgeon: [name], DDS;  Location: [facility];  Service: Oral Surgery;  Laterality: Right;  . TRANSURETHRAL RESECTION OF PROSTATE  04/13/2018   Procedure: TRANSURETHRAL RESECTION OF THE PROSTATE (TURP);  Surgeon: Irine Seal, MD;  Location: [facility];  Service: Urology;;    Medication: Current Facility-Administered Medications  Medication Dose Route Frequency Provider Last Rate Last Dose  . 0.9 %  sodium chloride infusion   Intravenous Continuous Darliss Cheney, MD 125 mL/hr at 03/01/19 1917    . acetaminophen (TYLENOL) tablet 650 mg  650 mg Oral Q6H PRN Darliss Cheney, MD       Or  . acetaminophen (TYLENOL) suppository 650 mg  650 mg Rectal Q6H PRN Pahwani, Ravi, MD      . albuterol (PROVENTIL) (2.5 MG/3ML) 0.083% nebulizer solution 2.5 mg  2.5 mg Inhalation Q6H PRN Pahwani, Ravi, MD      . enoxaparin (LOVENOX) injection 40 mg  40 mg Subcutaneous Q24H Darliss Cheney, MD   40 mg at 03/01/19 2203  . escitalopram (LEXAPRO) tablet 10 mg  10 mg Oral Daily Pahwani, Ravi, MD      . feeding supplement (ENSURE ENLIVE) (ENSURE ENLIVE) liquid 237 mL  237 mL Oral BID BM Pahwani, Ravi, MD      . gabapentin (NEURONTIN) capsule 300 mg  300 mg Oral TID Darliss Cheney, MD   300 mg at 03/01/19 2205  . LORazepam (ATIVAN) tablet 0.5 mg  0.5 mg Oral Q4H PRN Darliss Cheney, MD      . magnesium citrate solution 1 Bottle  1 Bottle Oral Daily PRN Darliss Cheney, MD      . metoprolol tartrate (LOPRESSOR) tablet 50 mg  50 mg Oral Daily Pahwani, Ravi, MD      . mirabegron ER (MYRBETRIQ) tablet 25 mg  25 mg Oral Daily Hollace Hayward K, NP   25 mg at 03/01/19 2219  . ondansetron (ZOFRAN) tablet 4 mg  4 mg Oral Q6H PRN Darliss Cheney, MD       Or  . ondansetron (ZOFRAN) injection 4 mg  4 mg Intravenous Q6H PRN Darliss Cheney, MD      . senna-docusate (Senokot-S) tablet 1 tablet  1 tablet Oral QHS Darliss Cheney, MD   1 tablet at 03/01/19 2206  . senna-docusate  (Senokot-S) tablet 2 tablet  2 tablet Oral Daily Pahwani, Ravi, MD      . sodium chloride flush (NS) 0.9 % injection 3 mL  3 mL Intravenous Q12H Pahwani, Ravi, MD      . tamsulosin (FLOMAX) capsule 0.4 mg  0.4 mg Oral QPC supper Lennox Grumbles, Belinda K, NP   0.4 mg at 03/01/19 2222    Allergies: Allergies  Allergen Reactions  . Dilaudid [Hydromorphone Hcl] Hives, Itching, Rash and Other (See Comments)    Causes un-controllable shakes  . Pollen Extract Other (See Comments)    Sneezing and shortness of breath    Social History: Social History   Tobacco Use  . Smoking status: Never  Smoker  . Smokeless tobacco: Never Used  Substance Use Topics  . Alcohol use: No  . Drug use: Never    Family History Family History  Problem Relation Age of Onset  . Hypertension Father   . Depression Brother     Review of Systems 10 systems were reviewed and are negative except as noted specifically in the HPI.  Objective   Vital signs in last 24 hours: BP (!) 157/110 (BP Location: Left Arm)   Pulse (!) 116   Temp 98.6 F (37 C) (Oral)   Resp 20   Ht 5\' 11"  (1.803 m)   Wt 89.6 kg   SpO2 100%   BMI 27.55 kg/m   Physical Exam General: Very uncomfortable, writhing around in bed due to bladder discomfort HEENT: [place]/AT, EOMI, MMM Pulmonary: Normal work of breathing Cardiovascular: HDS, adequate peripheral perfusion Abdomen: Tender and distended, especially in the suprapubic area GU: 22 French catheter, nondraining. Extremities: warm and well perfused Neuro: Appropriate, no focal neurological deficits  Most Recent Labs: Lab Results  Component Value Date   WBC 12.4 (H) 03/01/2019   HGB 8.8 (L) 03/01/2019   HCT 29.5 (L) 03/01/2019   PLT 352 03/01/2019    Lab Results  Component Value Date   NA 136 03/01/2019   K 4.3 03/01/2019   CL 100 03/01/2019   CO2 26 03/01/2019   BUN 22 (H) 03/01/2019   CREATININE 2.20 (H) 03/01/2019   CALCIUM 8.6 (L) 03/01/2019   MG 2.1 02/11/2019   PHOS  4.8 (H) 02/11/2019    Lab Results  Component Value Date   INR 1.1 11/10/2018   APTT 55 (H) 06/27/2018     IMAGING: No results found.  ------  Assessment:  53 y.o. male with metastatic prostate cancer.  Existing Foley catheter was likely in the prostatic urethra which prevented it from draining the bladder.  Able to scope in a 51 [place] council tip catheter in order to drain the bladder.  Significant debris from tumor burden and possible infection upon placement   Recommendations: -Recommend sending urine culture and empirically treating for urinary tract infection  - Belladonna and opium suppositories every 8 as needed for bladder spasm  -No further intervention given hospice status.  Recommend changing existing Foley catheter over a wire every 4 weeks.   Thank you for this consult. Please contact the urology consult pager with any further questions/concerns.

## 2019-03-02 NOTE — Consult Note (Signed)
Urology Follow up Consult Note      Subjective: Patient much more comfortable with foley in place. Urine culture pending and on Ceftriaxone.   Objective: Vital signs in last 24 hours: Temp:  [98 F (36.7 C)-101.2 F (38.4 C)] 101.2 F (38.4 C) (06/26 1311) Pulse Rate:  [102-123] 102 (06/26 1311) Resp:  [20-27] 25 (06/26 1311) BP: (110-164)/(76-118) 112/86 (06/26 1311) SpO2:  [94 %-100 %] 100 % (06/26 1311) Weight:  [87.2 kg-89.6 kg] 87.2 kg (06/26 0637)  Intake/Output from previous day: 06/25 0701 - 06/26 0700 In: 2022.6 [P.O.:240; I.V.:773.3; IV Piggyback:1009.3] Out: 650 [Urine:650] Intake/Output this shift: Total I/O In: -  Out: 625 [Urine:625]  Physical Exam:  General: Comfortable and resting. Pulmonary: Normal work of breathing Cardiovascular: HDS, adequate peripheral perfusion Abdomen: Nontender, non distended  GU: 22 [place] council, draining murky yellow urine  Lab Results: Recent Labs    03/01/19 1154 03/02/19 0426  HGB 8.8* 8.1*  HCT 29.5* 27.0*   BMET Recent Labs    03/01/19 1154 03/02/19 0426  NA 136 140  K 4.3 4.5  CL 100 102  CO2 26 31  GLUCOSE 138* 175*  BUN 22* 29*  CREATININE 2.20* 2.24*  CALCIUM 8.6* 8.1*     Studies/Results: No results found.  Assessment/Plan:  53 y.o. male with metastatic prostate cancer.  Existing Foley catheter was likely in the prostatic urethra which prevented it from draining the bladder.  Able to scope in a 40 [place] council tip catheter in order to drain the bladder.  Significant debris from tumor burden and possible infection upon placement  Plan: - Follow up urine culture and treat Y48 days for complicated UTI  - Belladonna and opium suppositories every 8 as needed for bladder spasm  -No further intervention given hospice status.  Foley to be exchanged at next outpatient stent exchange  - Dr. Jeffie Pollock has patient planned for stent change 03/27/2019.   Urology will sign off. Please page the urology  consult pager with questions or concerns.

## 2019-03-03 DIAGNOSIS — N170 Acute kidney failure with tubular necrosis: Secondary | ICD-10-CM

## 2019-03-03 DIAGNOSIS — I10 Essential (primary) hypertension: Secondary | ICD-10-CM

## 2019-03-03 LAB — NOVEL CORONAVIRUS, NAA (HOSP ORDER, SEND-OUT TO REF LAB; TAT 18-24 HRS): SARS-CoV-2, NAA: NOT DETECTED

## 2019-03-03 MED ORDER — OXYCODONE HCL 20 MG PO TABS: 10.0000 mg | ORAL_TABLET | ORAL | 0 refills | Status: AC | PRN

## 2019-03-03 MED ORDER — MORPHINE SULFATE ER 15 MG PO TBCR: 15.0000 mg | EXTENDED_RELEASE_TABLET | Freq: Two times a day (BID) | ORAL | 0 refills | Status: AC

## 2019-03-03 MED ORDER — CIPROFLOXACIN HCL 500 MG PO TABS: 500.0000 mg | ORAL_TABLET | Freq: Two times a day (BID) | ORAL | 0 refills | Status: AC

## 2019-03-03 MED ORDER — BELLADONNA ALKALOIDS-OPIUM 16.2-60 MG RE SUPP: 1.0000 | Freq: Three times a day (TID) | RECTAL | 0 refills | Status: AC | PRN

## 2019-03-03 NOTE — Discharge Summary (Signed)
Physician Discharge Summary  Dylan Gonzalez ZTI:[id number] DOB: [birth year] DOA: 03/01/2019  PCP: Donald Prose, MD  Admit date: 03/01/2019 Discharge date: 03/03/2019  Admitted From: Home Disposition: Home  Recommendations for Outpatient Follow-up:  1. Follow up with PCP in 1-2 weeks 2. Please obtain BMP/CBC in one week your next doctors visit.  3. Oral Cipro twice a day for 2 weeks 4. Morphine reduced to 15 mg twice daily, oxycodone reduced to 10 mg every 4 hours as needed.  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  Patient has been followed by hospice team outpatient.  Urology team was consulted who performed exchange of the catheter and recommended IV antibiotics and fluids.  Patient's cultures were negative at the time of her discharge from the hospital but after reviewing the previous culture data from earlier this month he has grown Pseudomonas which is sensitive to Cipro.  We will transition him to oral Cipro for 2 weeks. There is also concerns for drowsiness and overmedication by his wife therefore will reduce his morphine to 15 mg twice daily from 30 mg twice daily and reduce oxycodone to 10 mg every 4 hours as needed from 20 mg. Stable for discharge.  Discharge Diagnoses:  Active Problems:   Hypertension   Prostate neoplasm   Primary leiomyosarcoma of intra-abdominal site [facility])   Leiomyosarcoma ([place])   Acute urinary retention   Acute renal failure superimposed on stage 3 chronic kidney disease (HCC)   Acute kidney injury (AKI) with acute tubular necrosis  (ATN) (HCC)   Confusion  Sepsis secondary to urinary tract infection from chronic indwelling Foley, POA Malfunctioning Foley, status post placed 6/26 -Foley exchanged on 03/02/2019.  Follow-up outpatient with urology.  Appointment made by their service for 03/27/2019.  Cultures during this admission is negative but after reviewing previous culture data, will transition him to 2 more weeks of oral Cipro.  Acute kidney injury on CKD stage III -Baseline creatinine 1.4.  Still elevated at 2.2.  Likely his new baseline.  Follow-up outpatient with primary care physician.  Slightly drowsy, improved -Concerned about multiple sedative medications.  Discussed that with his wife.  Morphine and oxycodone reduced to half the dose as mentioned above.  History of prostate cancer/leiomyosarcoma -He is on hospice outpatient.  Can continue to follow-up on discharge.  Chronic diastolic congestive heart failure -Resume home meds  Essential hypertension -Resume home meds.  Holding output nephrotoxic drugs.  Resume home hospice  Consultations:  Urology  Subjective: No complaints.  Feels back to himself.  He understands why his narcotics are reduced.  Discharge Exam: Vitals:   03/03/19 0511 03/03/19 0835  BP: (!) 124/93 (!) 136/101  Pulse: 83 86  Resp: 16 16  Temp: 97.9 F (36.6 C) 98 F (36.7 C)  SpO2: 96% 96%   Vitals:   03/02/19 2049 03/03/19 0058 03/03/19 0511 03/03/19 0835  BP: 116/87 (!) 132/92 (!) 124/93 (!) 136/101  Pulse: 85 88 83 86  Resp: 20 16 16 16   Temp: 98 F (36.7 C) 98.2 F (36.8 C) 97.9 F (36.6 C) 98 F (36.7 C)  TempSrc: Oral Oral Oral Oral  SpO2: 100% 100% 96% 96%  Weight:   85.3 kg   Height:        General: Pt is alert, awake, not in acute distress, frail-appearing Cardiovascular: RRR, S1/S2 +, no rubs, no gallops Respiratory: CTA bilaterally, no wheezing, no rhonchi Abdominal: Soft, NT, ND, bowel sounds + Extremities: no edema, no  cyanosis  Discharge Instructions  Discharge Instructions    Call MD for:  severe uncontrolled pain   Complete by: As directed    Call MD for:  temperature >100.4   Complete by: As directed    Diet - low sodium heart healthy   Complete by: As directed    Increase activity slowly   Complete by: As directed      Allergies as of 03/03/2019      Reactions   Dilaudid [hydromorphone Hcl] Hives, Itching, Rash, Other (See Comments)   Causes un-controllable shakes   Pollen Extract Other (See Comments)   Sneezing and shortness of breath      Medication List    TAKE these medications   ciprofloxacin 500 MG tablet Commonly known as: Cipro Take 1 tablet (500 mg total) by mouth 2 (two) times daily for 14 days.   Citroma Soln Generic drug: magnesium citrate Take 1 Bottle by mouth daily as needed for severe constipation.   diazepam 5 MG tablet Commonly known as: VALIUM Take 5 mg by mouth 2 (two) times a day.   escitalopram 10 MG tablet Commonly known as: LEXAPRO Take 10 mg by mouth daily.   fluticasone 50 MCG/ACT nasal spray Commonly known as: FLONASE PLACE 2 SPRAYS INTO BOTH NOSTRILS DAILY.   gabapentin 300 MG capsule Commonly known as: NEURONTIN Take 300 mg by mouth 3 (three) times daily.   LORazepam 0.5 MG tablet Commonly known as: ATIVAN Take 0.5 mg by mouth every 4 (four) hours as needed for anxiety.   metoprolol tartrate 50 MG tablet Commonly known as: LOPRESSOR Take 50 mg by mouth daily.   morphine 15 MG 12 hr tablet Commonly known as: MS CONTIN Take 1 tablet (15 mg total) by mouth every 12 (twelve) hours for 5 days. What changed:   medication strength  how much to take   Myrbetriq 25 MG Tb24 tablet Generic drug: mirabegron ER Take 25 mg by mouth daily.   opium-belladonna 16.2-60 MG suppository Commonly known as: B&O SUPPRETTES Place 1 suppository rectally every 8 (eight) hours as needed for up to 7 days for bladder spasms.   Oxycodone HCl 20 MG  Tabs Take 0.5 tablets (10 mg total) by mouth every 4 (four) hours as needed for up to 5 days (severe pain). What changed: how much to take   polyethylene glycol 17 g packet Commonly known as: MIRALAX / GLYCOLAX Take 17 g by mouth 2 (two) times daily as needed for mild constipation or moderate constipation.   Proventil HFA 108 (90 Base) MCG/ACT inhaler Generic drug: albuterol Inhale 2 puffs into the lungs every 6 (six) hours as needed for wheezing or shortness of breath.   albuterol (2.5 MG/3ML) 0.083% nebulizer solution Commonly known as: PROVENTIL Take 2.5 mg by nebulization every 6 (six) hours as needed for wheezing or shortness of breath.   sennosides-docusate sodium 8.6-50 MG tablet Commonly known as: SENOKOT-S Take 1-2 tablets by mouth See admin instructions. Take 2 tablets in the morning and 1 tablet at bedtime      Follow-up Information    Donald Prose, MD. Schedule an appointment as soon as possible for a visit in 2 week(s).   Specialty:  Family Medicine Contact information: [place] [address] [phone]          Allergies  Allergen Reactions  . Dilaudid [Hydromorphone Hcl] Hives, Itching, Rash and Other (See Comments)    Causes un-controllable shakes  . Pollen Extract Other (See Comments)    Sneezing and shortness of breath    You were cared for by a hospitalist during your hospital stay. If you have any questions about your discharge medications or the care you received while you were in the hospital after you are discharged, you can call the unit and asked to speak with the hospitalist on call if the hospitalist that took care of you is not available. Once you are discharged, your primary care physician will handle any further medical issues. Please note that no refills for any discharge medications will be authorized once you are discharged, as it is imperative that you return to your primary care physician (or establish a  relationship with a primary care physician if you do not have one) for your aftercare needs so that they can reassess your need for medications and monitor your lab values.   Procedures/Studies: Dg Chest 2 View  Result Date: 02/10/2019 CLINICAL DATA:  53 year old male with history of prostate cancer. EXAM: CHEST - 2 VIEW COMPARISON:  Chest x-ray 11/10/2018. FINDINGS: Right internal jugular single-lumen power porta cath with tip terminating at the superior cavoatrial junction. Lung volumes are low. No consolidative airspace disease. No pleural effusions. No pneumothorax. No definite suspicious appearing pulmonary nodules or masses. Pulmonary venous congestion, without frank pulmonary edema. Moderate cardiomegaly. Upper mediastinal contours are within normal limits. Aortic atherosclerosis. IMPRESSION: 1. Low lung volumes with cardiomegaly and pulmonary venous congestion, but no frank pulmonary edema. 2. Aortic atherosclerosis. Electronically Signed   By: Vinnie Langton M.D.   On: 02/10/2019 20:13   Ct Head Wo Contrast  Result Date: 02/09/2019 CLINICAL DATA:  Weakness EXAM: CT HEAD WITHOUT CONTRAST TECHNIQUE: Contiguous axial images were obtained from the base of the skull through the vertex without intravenous contrast. COMPARISON:  CT head dated 02/02/2014. FINDINGS: Brain: No evidence of acute infarction, hemorrhage, hydrocephalus, extra-axial collection or mass lesion/mass effect. There is age related volume loss. There are advanced chronic microvascular ischemic changes bilaterally. Vascular: No hyperdense vessel or unexpected calcification. Skull: Normal. Negative for fracture or focal lesion. Sinuses/Orbits: No acute finding. Postsurgical changes are noted of the left orbit. Other: None. IMPRESSION: 1. No acute intracranial abnormality detected. 2. Chronic microvascular ischemic changes with age related volume loss, progressed from 34. Electronically Signed   By: Constance Holster M.D.   On:  02/09/2019 23:35   Ct Renal Stone Study  Result Date: 02/09/2019 CLINICAL DATA:  Bladder and prostate cancer with increasing left lower quadrant abdominal pain. EXAM: CT ABDOMEN AND PELVIS WITHOUT CONTRAST TECHNIQUE: Multidetector CT imaging of the abdomen and pelvis was performed following the standard protocol without IV contrast. COMPARISON:  None. FINDINGS: Lower chest: There is a 1.7 cm pulmonary nodule in the left lower lobe. This has increased significantly in size from prior study. Heart size is significantly enlarged. There is a trace right-sided pleural effusion. Hepatobiliary: No focal liver abnormality is seen. No gallstones, gallbladder wall thickening, or biliary dilatation. Pancreas: Unremarkable. No pancreatic ductal dilatation or surrounding inflammatory changes. Spleen: Normal in size without focal abnormality. Adrenals/Urinary Tract: There are bilateral double-J ureteral stents in place. There is some mild bilateral collecting system dilatation. The stents appear to terminate in the decompressed  urinary bladder. There is a Foley catheter in place. Stomach/Bowel: The stomach is unremarkable. There is a moderate amount of stool in the colon. The appendix is unremarkable. There is no evidence of a small-bowel obstruction. Vascular/Lymphatic: An IVC filter is in place. There are some prominent but subcentimeter retroperitoneal lymph nodes. There are prominent inguinal and pelvic lymph nodes. Reproductive: There is a large prostate mass currently measuring approximately 7.8 by 8.4 cm (previously measuring 10 x 11 cm. There is a central low attenuation area measuring approximately 21 Hounsfield units. This may represent a necrotic portion of the tumor. Other: No abdominal wall hernia or abnormality. No abdominopelvic ascites. Musculoskeletal: No acute or significant osseous findings. IMPRESSION: 1. Examination limited by lack of IV contrast. 2. Dominant pelvic mass with interval decrease in size  consistent with the patient's known prostate cancer. There is a central low-attenuation area which may represent necrosis. 3. Significant interval increase in size of a left lower lobe pulmonary nodule currently measuring approximately 1.6 cm. This is consistent with metastatic disease. 4. Cardiomegaly.  There is a trace right-sided pleural effusion. 5. Moderate amount of stool throughout the colon. 6. Bilateral double-J ureteral stents are in place. There is mild symmetric bilateral collecting system dilatation without frank hydronephrosis. 7. IVC filter in place. Electronically Signed   By: Constance Holster M.D.   On: 02/09/2019 23:43      The results of significant diagnostics from this hospitalization (including imaging, microbiology, ancillary and laboratory) are listed below for reference.     Microbiology: Recent Results (from the past 240 hour(s))  Urine Culture     Status: Abnormal   Collection Time: 02/25/19  3:52 AM   Specimen: Urine, Random  Result Value Ref Range Status   Specimen Description   Final    URINE, RANDOM Performed at [facility] [address], [place], [place] [address]    Special Requests   Final    NONE Performed at [facility], [place] [address], [place], [place] [address]    Culture (A)  Final    <10,000 COLONIES/mL INSIGNIFICANT GROWTH Performed at [facility] [address], [place],  [address]    Report Status 02/26/2019 FINAL  Final  Novel Coronavirus,NAA,(SEND-OUT TO REF LAB - TAT 24-48 hrs); Hosp Order     Status: None   Collection Time: 03/01/19  3:57 PM   Specimen: Nasopharyngeal Swab; Respiratory  Result Value Ref Range Status   SARS-CoV-2, NAA NOT DETECTED NOT DETECTED Final    Comment: (NOTE) This test was developed and its performance characteristics determined by [employer]. This test has not been FDA cleared or approved. This test has been authorized by FDA under  an Emergency Use Authorization (EUA). This test is only authorized for the duration of time the declaration that circumstances exist justifying the authorization of the emergency use of in vitro diagnostic tests for detection of SARS-CoV-2 virus and/or diagnosis of COVID-19 infection under section 564(b)(1) of the Act, 21 U.S.C. 096GEZ-6(O)(2), unless the authorization is terminated or revoked sooner. When diagnostic testing is negative, the possibility of a false negative result should be considered in the context of a patient's recent exposures and the presence of clinical signs and symptoms consistent with COVID-19. An individual without symptoms of COVID-19 and who is not shedding SARS-CoV-2 virus would expect to have a negative (not detected) result in this assay. Performed  At: [facility] [place], [place] [id number] Rush Farmer MD PT:[id number]  Coronavirus Source NASOPHARYNGEAL  Final    Comment: Performed at [facility], [place] [address], [place], [place] [address]  Culture, Urine     Status: None (Preliminary result)   Collection Time: 03/02/19  5:47 AM   Specimen: Urine, Catheterized  Result Value Ref Range Status   Specimen Description   Final    URINE, CATHETERIZED Performed at [facility], [place] [address] [place], [place] [address]    Special Requests   Final    NONE Performed at [facility], [place] [address], [place], [place] [address]    Culture   Final    CULTURE REINCUBATED FOR BETTER GROWTH Performed at [facility], [place] [address], [place], [place] [address]    Report Status PENDING  Incomplete     Labs: BNP (last 3 results) Recent Labs    10/21/18 1420 11/10/18 1542 02/11/19 1313  BNP 234.6* 100.7* 299.2*   Basic Metabolic Panel: Recent Labs  Lab 03/01/19 1154 03/02/19 0426  NA 136 140  K 4.3 4.5  CL 100 102  CO2 26 31  GLUCOSE 138* 175*  BUN 22*  29*  CREATININE 2.20* 2.24*  CALCIUM 8.6* 8.1*   Liver Function Tests: Recent Labs  Lab 03/01/19 1154  AST 18  ALT 12  ALKPHOS 60  BILITOT 0.4  PROT 8.8*  ALBUMIN 3.1*   No results for input(s): LIPASE, AMYLASE in the last 168 hours. No results for input(s): AMMONIA in the last 168 hours. CBC: Recent Labs  Lab 03/01/19 1154 03/02/19 0426  WBC 12.4* 11.9*  NEUTROABS 9.4*  --   HGB 8.8* 8.1*  HCT 29.5* 27.0*  MCV 85.0 85.4  PLT 352 271   Cardiac Enzymes: No results for input(s): CKTOTAL, CKMB, CKMBINDEX, TROPONINI in the last 168 hours. BNP: Invalid input(s): POCBNP CBG: No results for input(s): GLUCAP in the last 168 hours. D-Dimer No results for input(s): DDIMER in the last 72 hours. Hgb A1c No results for input(s): HGBA1C in the last 72 hours. Lipid Profile No results for input(s): CHOL, HDL, LDLCALC, TRIG, CHOLHDL, LDLDIRECT in the last 72 hours. Thyroid function studies No results for input(s): TSH, T4TOTAL, T3FREE, THYROIDAB in the last 72 hours.  Invalid input(s): FREET3 Anemia work up No results for input(s): VITAMINB12, FOLATE, FERRITIN, TIBC, IRON, RETICCTPCT in the last 72 hours. Urinalysis    Component Value Date/Time   COLORURINE YELLOW 03/01/2019 1400   APPEARANCEUR TURBID (A) 03/01/2019 1400   LABSPEC 1.008 03/01/2019 1400   PHURINE 6.0 03/01/2019 1400   GLUCOSEU NEGATIVE 03/01/2019 1400   HGBUR LARGE (A) 03/01/2019 1400   BILIRUBINUR NEGATIVE 03/01/2019 1400   BILIRUBINUR neg 06/01/2014 1333   KETONESUR NEGATIVE 03/01/2019 1400   PROTEINUR >=300 (A) 03/01/2019 1400   UROBILINOGEN 0.2 04/19/2015 0907   NITRITE NEGATIVE 03/01/2019 1400   LEUKOCYTESUR LARGE (A) 03/01/2019 1400   Sepsis Labs Invalid input(s): PROCALCITONIN,  WBC,  LACTICIDVEN Microbiology Recent Results (from the past 240 hour(s))  Urine Culture     Status: Abnormal   Collection Time: 02/25/19  3:52 AM   Specimen: Urine, Random  Result Value Ref Range Status   Specimen  Description   Final    URINE, RANDOM Performed at [facility], [place] [address] [place], [place] [address]    Special Requests   Final    NONE Performed at [facility], [place] [address] [place], [place] [address]    Culture (A)  Final    <  10,000 COLONIES/mL INSIGNIFICANT GROWTH Performed at [facility], [place] [address] [place], [place] [address]    Report Status 02/26/2019 FINAL  Final  Novel Coronavirus,NAA,(SEND-OUT TO REF LAB - TAT 24-48 hrs); Hosp Order     Status: None   Collection Time: 03/01/19  3:57 PM   Specimen: Nasopharyngeal Swab; Respiratory  Result Value Ref Range Status   SARS-CoV-2, NAA NOT DETECTED NOT DETECTED Final    Comment: (NOTE) This test was developed and its performance characteristics determined by [employer]. This test has not been FDA cleared or approved. This test has been authorized by FDA under an Emergency Use Authorization (EUA). This test is only authorized for the duration of time the declaration that circumstances exist justifying the authorization of the emergency use of in vitro diagnostic tests for detection of SARS-CoV-2 virus and/or diagnosis of COVID-19 infection under section 564(b)(1) of the Act, 21 U.S.C. 537HKF-2(X)(6), unless the authorization is terminated or revoked sooner. When diagnostic testing is negative, the possibility of a false negative result should be considered in the context of a patient's recent exposures and the presence of clinical signs and symptoms consistent with COVID-19. An individual without symptoms of COVID-19 and who is not shedding SARS-CoV-2 virus would expect to have a negative (not detected) result in this assay. Performed  At: [facility] [place], [place] [id number] Rush Farmer MD MB:[id number]    [place]  Final    Comment: Performed at [place] [address],  [place], [place] [address]  Culture, Urine     Status: None (Preliminary result)   Collection Time: 03/02/19  5:47 AM   Specimen: Urine, Catheterized  Result Value Ref Range Status   Specimen Description   Final    URINE, CATHETERIZED Performed at [facility], [place] [address] [place], [place] [address]    Special Requests   Final    NONE Performed at [facility], [place] [address] [place], [place] [address]    Culture   Final    CULTURE REINCUBATED FOR BETTER GROWTH Performed at [facility], [place] [address] [place],  [address]    Report Status PENDING  Incomplete     Time coordinating discharge:  I have spent 35 minutes face to face with the patient and on the ward discussing the patients care, assessment, plan and disposition with other care givers. >50% of the time was devoted counseling the patient about the risks and benefits of treatment/Discharge disposition and coordinating care.   SIGNED:   Damita Lack, MD  [facility] 03/03/2019, 10:49 AM   If 7PM-7AM, please contact night-coverage [email or web address]

## 2019-03-03 NOTE — Progress Notes (Signed)
Manufacturing engineer [facility]) Hospice  Spoke with Mr. Schalk wife Annie Main, she advised that he is being discharged today.  Please use [facility] for ambulance transport back home, as they contract this service for our active hospice pts.    [facility] will send a RN to the home tomorrow to complete a post hospitalization visit.  Please call [phone] once pt has discharged home.  Thank you, Venia Carbon RN, BSN, [facility] Liaison (in [place]) [phone] (main #)

## 2019-03-03 NOTE — TOC Transition Note (Signed)
Transition of Care [facility]) - CM/SW Discharge Note   Patient Details  Name: Dylan Gonzalez MRN: [id number] Date of Birth: [birth year]  Transition of Care [facility]) CM/SW Contact:  Claudine Mouton, LCSW Phone Number: 03/03/2019, 2:44 PM   Clinical Narrative:   Pt chooses to go home.  CSW called pt's wife who confirmed she will be there to assist the pt when he arrives.    Final next level of care: Home/Self Care Barriers to Discharge: No Barriers Identified   Patient Goals and CMS Choice Patient states their goals for this hospitalization and ongoing recovery are:: Get home safely.      Discharge Placement                Patient to be transferred to facility by: Corey Harold) Name of family member notified: Salvator Seppala at ph:[phone] Patient and family notified of of transfer: 03/03/19  Discharge Plan and Services                                     Social Determinants of Health ([place]) Interventions     Readmission Risk Interventions Readmission Risk Prevention Plan 02/12/2019 08/05/2018  Transportation Screening Complete Complete  Medication Review Press photographer) Complete Complete  PCP or Specialist appointment within 3-5 days of discharge Not Complete Patient refused  PCP/Specialist Appt Not Complete comments not yet ready for d/c -  [facility] or Home Care Consult Complete Complete  SW Recovery Care/Counseling Consult Complete -  Palliative Care Screening Complete -  [place] Not Applicable -  Some recent data might be hidden

## 2019-03-03 NOTE — Progress Notes (Addendum)
RN spoke to pt with the CSW as a verbal witness who provided verbal permission for the CSW to contact pt's wife and updated her.  CSW attempted to call pt's spouse Yago Ludvigsen at ph: [phone] and left a HIPPA-compliant VM requesting a call back and advising of D/C.  2:07 PM CSW called and updated pt's wife who confirmed she will be there when PTAR arrives.  2:10 PM PTAR called.  CSW will continue to follow for D/C needs.  Alphonse Guild. Ashby Leflore, LCSW, LCAS, CSI Transitions of Care Clinical Social Worker Care Coordination Department Ph: [phone]

## 2019-03-03 NOTE — Progress Notes (Signed)
AVS given to patient and explained at the bedside. Medications and follow up appointments have been explained with pt verbalizing understanding. PTAR arranged for transport home at pt request.

## 2019-03-04 LAB — URINE CULTURE: Culture: 70000 — AB

## 2019-03-08 ENCOUNTER — Emergency Department (HOSPITAL_COMMUNITY)
Admission: EM | Admit: 2019-03-08 | Discharge: 2019-03-08 | Disposition: A | Discharge status: Home / Self Care | Payer: Self-pay | Attending: Emergency Medicine | Admitting: Emergency Medicine

## 2019-03-08 ENCOUNTER — Encounter (HOSPITAL_COMMUNITY): Payer: Self-pay

## 2019-03-08 ENCOUNTER — Other Ambulatory Visit: Payer: Self-pay

## 2019-03-08 DIAGNOSIS — N3090 Cystitis, unspecified without hematuria: Secondary | ICD-10-CM

## 2019-03-08 DIAGNOSIS — Z79899 Other long term (current) drug therapy: Secondary | ICD-10-CM | POA: Insufficient documentation

## 2019-03-08 DIAGNOSIS — N183 Chronic kidney disease, stage 3 (moderate): Secondary | ICD-10-CM | POA: Insufficient documentation

## 2019-03-08 DIAGNOSIS — N3091 Cystitis, unspecified with hematuria: Secondary | ICD-10-CM | POA: Insufficient documentation

## 2019-03-08 DIAGNOSIS — I130 Hypertensive heart and chronic kidney disease with heart failure and stage 1 through stage 4 chronic kidney disease, or unspecified chronic kidney disease: Secondary | ICD-10-CM | POA: Insufficient documentation

## 2019-03-08 DIAGNOSIS — Z8673 Personal history of transient ischemic attack (TIA), and cerebral infarction without residual deficits: Secondary | ICD-10-CM | POA: Insufficient documentation

## 2019-03-08 DIAGNOSIS — I5032 Chronic diastolic (congestive) heart failure: Secondary | ICD-10-CM | POA: Insufficient documentation

## 2019-03-08 DIAGNOSIS — I2510 Atherosclerotic heart disease of native coronary artery without angina pectoris: Secondary | ICD-10-CM | POA: Insufficient documentation

## 2019-03-08 LAB — URINALYSIS, ROUTINE W REFLEX MICROSCOPIC
Bacteria, UA: NONE SEEN
Bilirubin Urine: NEGATIVE
Glucose, UA: NEGATIVE mg/dL
Ketones, ur: NEGATIVE mg/dL
Nitrite: NEGATIVE
Protein, ur: 100 mg/dL — AB
RBC / HPF: >50 RBC/hpf — ABNORMAL HIGH (ref 0–5)
Specific Gravity, Urine: 1.01 (ref 1.005–1.030)
WBC, UA: >50 WBC/hpf — ABNORMAL HIGH (ref 0–5)
pH: 7 (ref 5.0–8.0)

## 2019-03-08 MED ORDER — LEVOFLOXACIN 500 MG PO TABS: 500.0000 mg | ORAL_TABLET | Freq: Every day | ORAL | 0 refills | Status: AC

## 2019-03-08 MED ORDER — LEVOFLOXACIN 500 MG PO TABS
500.0000 mg | ORAL_TABLET | Freq: Once | ORAL | Status: AC
  Administered 2019-03-08: 500 mg via ORAL
  Filled 2019-03-08: qty 1

## 2019-03-08 NOTE — ED Notes (Signed)
Bed: WA02 Expected date:  Expected time:  Means of arrival:  Comments: EMS blood in cath

## 2019-03-08 NOTE — ED Notes (Signed)
Hospice nurse [facility] made contact, and update on pt.

## 2019-03-08 NOTE — ED Provider Notes (Signed)
[facility] DEPT Provider Note   CSN: [id number] Arrival date & time: 03/08/19  1408     History   Chief Complaint Chief Complaint  Patient presents with  . Hematuria    HPI Dylan Gonzalez is a 53 y.o. male.     Patient came to the emergency department for blood in his urine.  He was being treated for urinary tract infection with Cipro.  The history is provided by the patient. No language interpreter was used.  Hematuria This is a recurrent problem. The current episode started yesterday. The problem occurs constantly. The problem has not changed since onset.Pertinent negatives include no chest pain, no abdominal pain and no headaches. Nothing aggravates the symptoms. Nothing relieves the symptoms. Treatments tried: Cipro. The treatment provided no relief.    Past Medical History:  Diagnosis Date  . Bilateral lower extremity edema   . Chronic diastolic congestive heart failure ([place]) 07/2018  . CKD (chronic kidney disease), stage II   . DOE (dyspnea on exertion)   . Foley catheter in place   . Hematuria   . History of CVA (cerebrovascular accident) 2007   per pt no residuals  . History of DVT of lower extremity 06/2018   left common femoral vein,  started on anticoagulant , stopped due to hematuria,  IVC filter placed 08-16-2018  . History of sepsis 09/28/2018   uti  . Hypertension   . Malignant neoplasm metastatic to lung ([place]) 04/19/2018  . Port-A-Cath in place 06/21/2018  . 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 S/P IVC filter 08/16/2018  . Urinary retention 09/28/2018   CHRONIC FOLEY CATH    Patient Active Problem List   Diagnosis Date Noted  . Acute kidney injury (AKI) with acute tubular necrosis (ATN) (HCC)    . Confusion   . Acute urinary retention 03/01/2019  . Acute renal failure superimposed on stage 3 chronic kidney disease ([place]) 03/01/2019  . Chronic diastolic CHF (congestive heart failure) ([place]) 02/10/2019  . Hematuria 02/10/2019  . Dehydration 02/10/2019  . Abdominal pain 09/28/2018  . Sepsis ([place]) 09/28/2018  . Anemia 08/14/2018  . Symptomatic anemia 08/13/2018  . Acute UTI (urinary tract infection) 08/02/2018  . Deep vein thrombosis (DVT) of left lower extremity ([place]) 08/02/2018  . Bilateral lower extremity edema 08/01/2018  . CAD (coronary artery disease) 08/01/2018  . Hypokalemia 08/01/2018  . Port-A-Cath in place 06/29/2018  . Lung nodules 05/24/2018  . Leiomyosarcoma ([place]) 05/23/2018  . Goals of care, counseling/discussion 05/15/2018  . Bilateral ureteral obstruction 04/19/2018  . Malignant neoplasm metastatic to lung ([place]) 04/19/2018  . Primary leiomyosarcoma of intra-abdominal site ([place]) 04/18/2018  . Prostate neoplasm 04/13/2018  . Hypertension 12/26/2011  . 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 CYSTOSCOPY W/ URETERAL STENT PLACEMENT Bilateral 04/13/2018   Procedure: CYSTOSCOPY WITH BILATERAL STENT REPLACEMENT;  Surgeon: Irine Seal, MD;  Location: [facility];  Service: Urology;  Laterality: Bilateral;  . CYSTOSCOPY WITH STENT PLACEMENT Bilateral 10/19/2018   Procedure: CYSTOSCOPY WITH STENT EXCHANGE Bilateral;  Surgeon: Irine Seal, MD;  Location: [facility];  Service: Urology;  Laterality: Bilateral;  . ENUCLEATION Left 1976   removal left eye due to Retinoplastoma  . INTRAOCULAR PROSTHESES  INSERTION    . IR IMAGING GUIDED PORT INSERTION  06/21/2018  . IR IVC FILTER PLMT / S&I /IMG GUID/MOD SED  08/16/2018  . LAPAROSCOPIC INGUINAL HERNIA REPAIR Right 11-19-2010   dr d. Ninfa Linden  @[facility]    AND UMBILICAL HERNIA REPAIR  . PROSTATE BIOPSY [name] 04/13/2018   Procedure: ULTRASOUND GUIDED PROSTATE BIOPSY;  Surgeon: Irine Seal, MD;  Location: [facility];  Service: Urology;  Laterality: [name];  . TOOTH EXTRACTION Right 04/21/2013   Procedure: EXTRACTION MOLARS;  Surgeon: [name], DDS;  Location: [facility];  Service: Oral Surgery;  Laterality: Right;  . TRANSURETHRAL RESECTION OF PROSTATE  04/13/2018   Procedure: TRANSURETHRAL RESECTION OF THE PROSTATE (TURP);  Surgeon: Irine Seal, MD;  Location: [facility];  Service: Urology;;        Home Medications    Prior to Admission medications   Medication Sig Start Date End Date Taking? Authorizing Provider  albuterol (PROVENTIL HFA) 108 (90 Base) MCG/ACT inhaler Inhale 2 puffs into the lungs every 6 (six) hours as needed for wheezing or shortness of breath.     [provider]  albuterol (PROVENTIL) (2.5 MG/3ML) 0.083% nebulizer solution Take 2.5 mg by nebulization every 6 (six) hours as needed for wheezing or shortness of breath.  12/11/18   [provider]  ciprofloxacin (CIPRO) 500 MG tablet Take 1 tablet (500 mg total) by mouth 2 (two) times daily for 14 days. 03/03/19 03/17/19  Amin, Jeanella Flattery, MD  diazepam (VALIUM) 5 MG tablet Take 5 mg by mouth 2 (two) times a day.     [provider]  escitalopram (LEXAPRO) 10 MG tablet Take 10 mg by mouth daily.    [provider]  fluticasone (FLONASE) 50 MCG/ACT nasal spray PLACE 2 SPRAYS INTO BOTH NOSTRILS DAILY. Patient not taking: No sig reported 07/22/14   Le, Thao P, DO  gabapentin (NEURONTIN) 300 MG capsule Take 300 mg by mouth 3 (three) times daily.    [provider]  levofloxacin (LEVAQUIN) 500 MG tablet Take 1 tablet (500 mg total) by mouth daily. 03/08/19   Milton Ferguson, MD  LORazepam (ATIVAN) 0.5 MG tablet Take 0.5 mg by mouth every 4 (four) hours as needed for anxiety.  11/03/18   [provider]  magnesium citrate (CITROMA) SOLN Take 1 Bottle by mouth daily as needed for severe constipation.    [provider]  metoprolol tartrate (LOPRESSOR) 50 MG tablet  Take 50 mg by mouth daily.     [provider]  mirabegron ER (MYRBETRIQ) 25 MG TB24 tablet Take 25 mg by mouth daily.    [provider]  morphine (MS CONTIN) 15 MG 12 hr tablet Take 1 tablet (15 mg total) by mouth every 12 (twelve) hours for 5 days. 03/03/19 03/08/19  Amin, Jeanella Flattery, MD  opium-belladonna (B&O SUPPRETTES) 16.2-60 MG suppository Place 1 suppository rectally every 8 (eight) hours as needed for up to 7 days for bladder spasms. 03/03/19 03/10/19  Amin, Jeanella Flattery, MD  Oxycodone HCl 20 MG TABS Take 0.5 tablets (10 mg total) by mouth every 4 (four) hours as needed for up to 5 days (severe pain). 03/03/19 03/08/19  Amin, Jeanella Flattery, MD  polyethylene glycol (MIRALAX / GLYCOLAX) packet Take 17 g by mouth 2 (two) times daily as needed for mild constipation or moderate constipation. Patient not taking: Reported on 03/01/2019 04/23/18   Orpah Greek, MD  sennosides-docusate sodium (SENOKOT-S) 8.6-50 MG tablet Take 1-2 tablets by mouth See admin  instructions. Take 2 tablets in the morning and 1 tablet at bedtime    [provider]    Family History Family History  Problem Relation Age of Onset  . Hypertension Father   . Depression Brother     Social History Social History   Tobacco Use  . Smoking status: Never Smoker  . Smokeless tobacco: Never Used  Substance Use Topics  . Alcohol use: No  . Drug use: Never     Allergies   Dilaudid [hydromorphone hcl] and Pollen extract   Review of Systems Review of Systems  Constitutional: Negative for appetite change and fatigue.  HENT: Negative for congestion, ear discharge and sinus pressure.   Eyes: Negative for discharge.  Respiratory: Negative for cough.   Cardiovascular: Negative for chest pain.  Gastrointestinal: Negative for abdominal pain and diarrhea.  Genitourinary: Positive for hematuria. Negative for frequency.  Musculoskeletal: Negative for back pain.  Skin: Negative for rash.   Neurological: Negative for seizures and headaches.  Psychiatric/Behavioral: Negative for hallucinations.     Physical Exam Updated Vital Signs BP (!) 142/108   Pulse 83   Temp 98.8 F (37.1 C) (Oral)   Resp 20   SpO2 100%   Physical Exam Vitals signs and nursing note reviewed.  Constitutional:      Appearance: He is well-developed.  HENT:     Head: Normocephalic.     Nose: Nose normal.  Eyes:     General: No scleral icterus.    Conjunctiva/sclera: Conjunctivae normal.  Neck:     Musculoskeletal: Neck supple.     Thyroid: No thyromegaly.  Cardiovascular:     Rate and Rhythm: Normal rate and regular rhythm.     Heart sounds: No murmur. No friction rub. No gallop.   Pulmonary:     Breath sounds: No stridor. No wheezing or rales.  Chest:     Chest wall: No tenderness.  Abdominal:     General: There is no distension.     Tenderness: There is no abdominal tenderness. There is no rebound.  Genitourinary:    Comments: Suprapubic catheter with blood in his urine Musculoskeletal: Normal range of motion.  Lymphadenopathy:     Cervical: No cervical adenopathy.  Skin:    Findings: No erythema or rash.  Neurological:     Mental Status: He is oriented to person, place, and time.     Motor: No abnormal muscle tone.     Coordination: Coordination normal.  Psychiatric:        Behavior: Behavior normal.      ED Treatments / Results  Labs (all labs ordered are listed, but only abnormal results are displayed) Labs Reviewed  URINALYSIS, ROUTINE W REFLEX MICROSCOPIC - Abnormal; Notable for the following components:      Result Value   APPearance CLOUDY (*)    Hgb urine dipstick MODERATE (*)    Protein, ur 100 (*)    Leukocytes,Ua LARGE (*)    RBC / HPF >50 (*)    WBC, UA >50 (*)    All other components within normal limits  URINE CULTURE    EKG None  Radiology No results found.  Procedures Procedures (including critical care time)  Medications Ordered in ED  Medications  levofloxacin (LEVAQUIN) tablet 500 mg (has no administration in time range)     Initial Impression / Assessment and Plan / ED Course  I have reviewed the triage vital signs and the nursing notes.  Pertinent labs & imaging results that were  available during my care of the patient were reviewed by me and considered in my medical decision making (see chart for details).        Patient with suprapubic catheter and UTI.  Patient has been on Cipro but according to the cultures, Cipro does not cover infection.  We will switch him to Levaquin and he will follow-up with his urologist  Final Clinical Impressions(s) / ED Diagnoses   Final diagnoses:  Cystitis    ED Discharge Orders         Ordered    levofloxacin (LEVAQUIN) 500 MG tablet  Daily     03/08/19 1846           Milton Ferguson, MD 03/08/19 1849

## 2019-03-08 NOTE — ED Triage Notes (Signed)
Pt arrived via EMS from home. Pt reports hematuria, and reports and some blot clots were reported. Pt does have hx of Prostate and Bladder Ca. Pt has some diarrhea and associated abdominal pain.  Denies nausea.   EMS vs/ 158/108, HR 90, RR 20, T

## 2019-03-08 NOTE — Discharge Instructions (Addendum)
Follow-up with your urologist next week.  Stop taking the Cipro.

## 2019-03-08 NOTE — ED Notes (Signed)
PTAR called  

## 2019-03-11 LAB — URINE CULTURE: Culture: 20000 — AB

## 2019-03-12 ENCOUNTER — Telehealth: Payer: Self-pay | Admitting: Emergency Medicine

## 2019-03-12 NOTE — Telephone Encounter (Signed)
Post ED Visit - Positive Culture Follow-up  Culture report reviewed by antimicrobial stewardship pharmacist: [facility] Team []  Elenor Quinones, Pharm.D. []  Heide Guile, Pharm.D., BCPS AQ-ID []  Parks Neptune, Pharm.D., BCPS []  Alycia Rossetti, Pharm.D., BCPS []  [place], Pharm.D., BCPS, AAHIVP []  Legrand Como, Pharm.D., BCPS, AAHIVP []  Salome Arnt, PharmD, BCPS []  Johnnette Gourd, PharmD, BCPS []  Hughes Better, PharmD, BCPS []  Leeroy Cha, PharmD []  Laqueta Linden, PharmD, BCPS []  Albertina Parr, PharmD  [place] Team []  Leodis Sias, PharmD []  Lindell Spar, PharmD []  Royetta Asal, PharmD [x]  Graylin Shiver, Rph []  Rema Fendt) Glennon Mac, PharmD []  Arlyn Dunning, PharmD []  Netta Cedars, PharmD []  Dia Sitter, PharmD []  Leone Haven, PharmD []  Gretta Arab, PharmD []  Theodis Shove, PharmD []  Peggyann Juba, PharmD []  Reuel Boom, PharmD   Positive urine culture Treated with levofloxacin, organism sensitive to the same and no further patient follow-up is required at this time.  Hazle Nordmann 03/12/2019, 12:53 PM

## 2019-03-22 NOTE — Patient Instructions (Addendum)
YOU NEED TO HAVE A COVID 19 TEST ON 03-23-2019 AT 1205 PM. THIS TEST MUST BE DONE BEFORE SURGERY, COME TO [facility] ENTRANCE . ONCE YOUR COVID TEST IS COMPLETED, PLEASE BEGIN THE QUARANTINE INSTRUCTIONS AS OUTLINED IN YOUR HANDOUT.                Dylan Gonzalez   Your procedure is scheduled on: 03-27-2019   Report to [facility] Main  Entrance Report to admitting at 9:30  AM      Call this number if you have problems the morning of surgery [phone]     Remember: Do not eat food or drink liquids :After Midnight.              BRUSH YOUR TEETH MORNING OF SURGERY AND RINSE YOUR MOUTH OUT, NO CHEWING GUM CANDY OR MINTS.     Take these medicines the morning of surgery with A SIP OF WATER: Gabapentin, Lexapro, Metoprolol Tartrate, Myrbetriq              Please bring Albuterol inhaler to the hospital with you              You may not have any metal on your body including piercings           Do not wear jewelry,, lotions, powders or, deodorant                       Men may shave face and neck.   Do not bring valuables to the hospital. [place].  Contacts, dentures or bridgework may not be worn into surgery.       Patients discharged the day of surgery will not be allowed to drive home.  IF YOU ARE HAVING SURGERY AND GOING HOME THE SAME DAY, YOU MUST HAVE AN ADULT TO DRIVE YOU HOME AND BE WITH YOU FOR 24 HOURS.  YOU MAY GO HOME BY TAXI OR UBER OR ORTHERWISE, BUT AN ADULT MUST ACCOMPANY YOU HOME AND STAY WITH YOU FOR 24 HOURS.  Name and phone number of your driver:  Special Instructions: [name]              Please read over the following fact sheets you were given: _____________________________________________________________________             [facility] - Preparing for Surgery Before surgery, you can play an important role.   Because skin is not sterile, your skin needs to be as free of  germs as possible.   You can reduce the number of germs on your skin by washing with CHG (chlorahexidine gluconate) soap before surgery .  CHG is an antiseptic cleaner which kills germs and bonds with the skin to continue killing germs even after washing. Please DO NOT use if you have an allergy to CHG or antibacterial soaps.   If your skin becomes reddened/irritated stop using the CHG and inform your nurse when you arrive at Short Stay. .  You may shave your face/neck. Please follow these instructions carefully:  1.  Shower with CHG Soap the night before surgery and the  morning of Surgery.  2.  If you choose to wash your hair, wash your hair first as usual with your  normal  shampoo.  3.  After you shampoo, rinse your hair and body thoroughly to remove the  shampoo.                                        4.  Use CHG as you would any other liquid soap.  You can apply chg directly  to the skin and wash                       Gently with a scrungie or clean washcloth.  5.  Apply the CHG Soap to your body ONLY FROM THE NECK DOWN.   Do not use on face/ open                           Wound or open sores. Avoid contact with eyes, ears mouth and genitals (private parts).                       Wash face,  Genitals (private parts) with your normal soap.             6.  Wash thoroughly, paying special attention to the area where your surgery  will be performed.  7.  Thoroughly rinse your body with warm water from the neck down.  8.  DO NOT shower/wash with your normal soap after using and rinsing off  the CHG Soap.             9.  Pat yourself dry with a clean towel.            10.  Wear clean pajamas.            11.  Place clean sheets on your bed the night of your first shower and do not  sleep with pets.  Day of Surgery : Do not apply any lotions/deodorants the morning of surgery.  Please wear clean clothes to the hospital/surgery center.   FAILURE TO FOLLOW THESE INSTRUCTIONS MAY RESULT IN THE  CANCELLATION OF YOUR SURGERY PATIENT SIGNATURE_________________________________  NURSE SIGNATURE__________________________________  ________________________________________________________________________

## 2019-03-23 ENCOUNTER — Inpatient Hospital Stay (HOSPITAL_COMMUNITY): Admission: RE | Admit: 2019-03-23 | Payer: Self-pay | Source: Ambulatory Visit

## 2019-03-23 ENCOUNTER — Encounter (HOSPITAL_COMMUNITY)
Admission: RE | Admit: 2019-03-23 | Discharge: 2019-03-23 | Disposition: A | Discharge status: Home / Self Care | Payer: Self-pay | Source: Ambulatory Visit | Attending: Family Medicine | Admitting: Family Medicine

## 2019-03-23 NOTE — Progress Notes (Signed)
Left message for Dylan Gonzalez to inquire his arrival for Covid 19 screening today 03/23/19. He did not arrive. Spoke to [place] at Dr Ralene Muskrat office to make [facility] aware of "no show" for Covid 19 test.

## 2019-03-27 ENCOUNTER — Ambulatory Visit (HOSPITAL_COMMUNITY): Admission: RE | Admit: 2019-03-27 | Payer: Self-pay | Source: Home / Self Care | Admitting: Urology

## 2019-03-27 ENCOUNTER — Encounter (HOSPITAL_COMMUNITY): Admission: RE | Payer: Self-pay | Source: Home / Self Care

## 2019-03-27 SURGERY — CYSTOSCOPY, FLEXIBLE, WITH STENT REPLACEMENT
Anesthesia: General | Laterality: Bilateral

## 2019-04-05 ENCOUNTER — Telehealth: Payer: Self-pay

## 2019-04-07 NOTE — Telephone Encounter (Signed)
Received fax from [facility] that patient died on 2019/04/17 at 17:38.

## 2019-04-07 DEATH — deceased

## 2020-08-19 IMAGING — CT CT ABD-PELV W/ CM
2 of 5 series · 16 of 46 positions shown, 18 images · IV contrast (omnipaque)
Comparison: CT abdomen pelvis dated August 14, 2018.

CLINICAL DATA: Right lower quadrant pain. Currently being treated
for prostate leiomyosarcoma.

EXAM:
CT ABDOMEN AND PELVIS WITH CONTRAST
TECHNIQUE: Multidetector CT imaging of the abdomen and pelvis was performed
using the standard protocol following bolus administration of
intravenous contrast.
CONTRAST:  100mL OMNIPAQUE IOHEXOL 300 MG/ML  SOLN

[Series 2: axial st · axial · 0.77mm/px · z∈[+1117,+1517]mm · 13 of 94 slices shown, 15 images]
[im 7/94  soft-tissue]
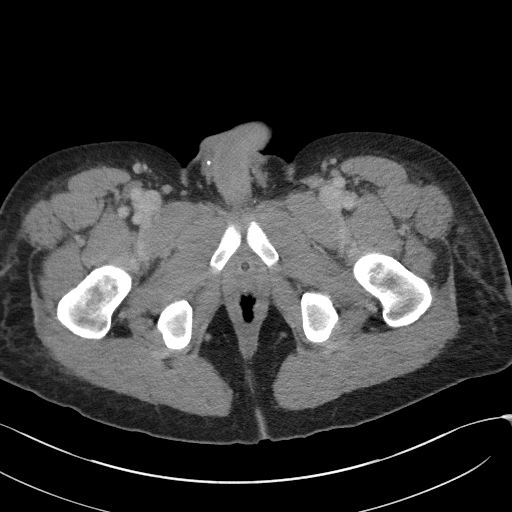
[im 7/94  bone]
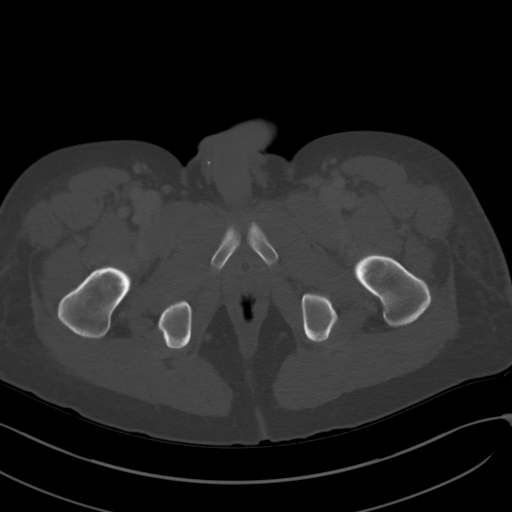
[im 14/94  soft-tissue]
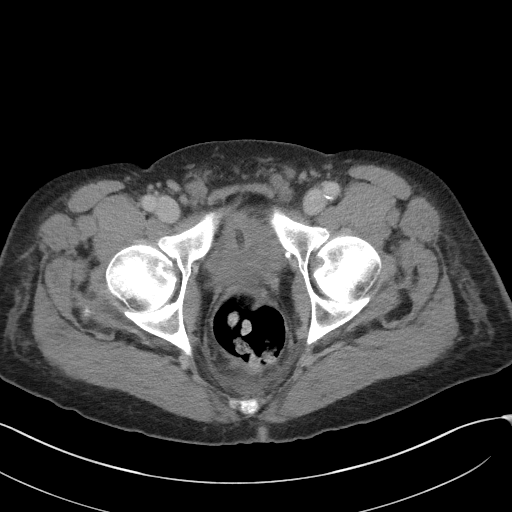
[im 20/94  soft-tissue]
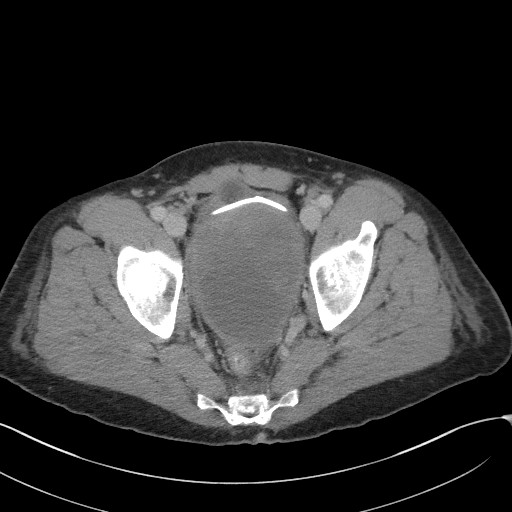
[im 27/94  soft-tissue]
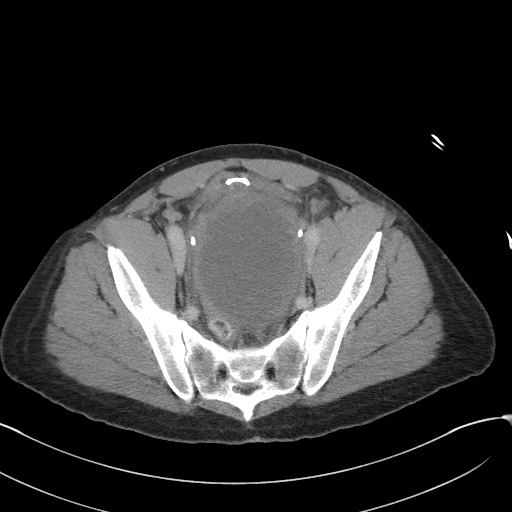
[im 34/94  soft-tissue]
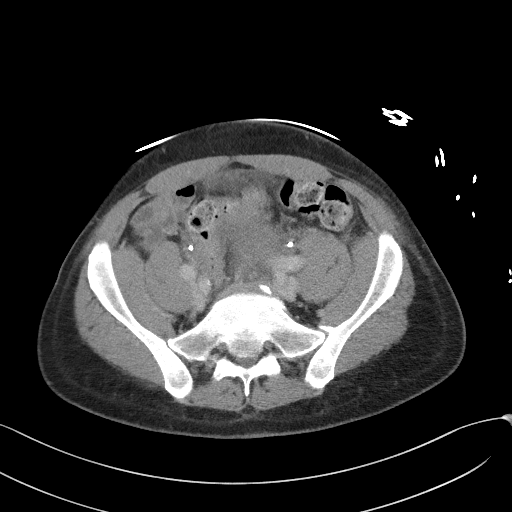
[im 40/94  soft-tissue]
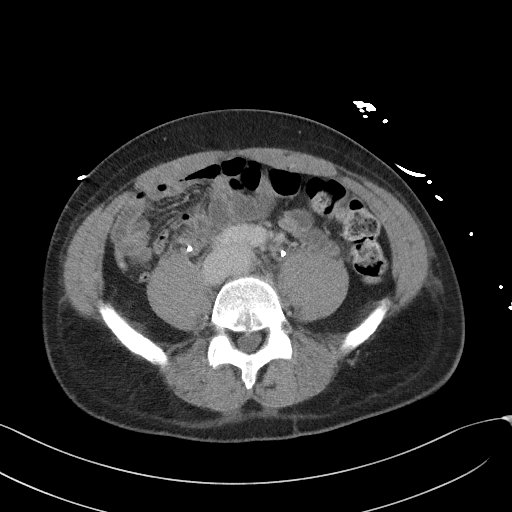
[im 47/94  soft-tissue]
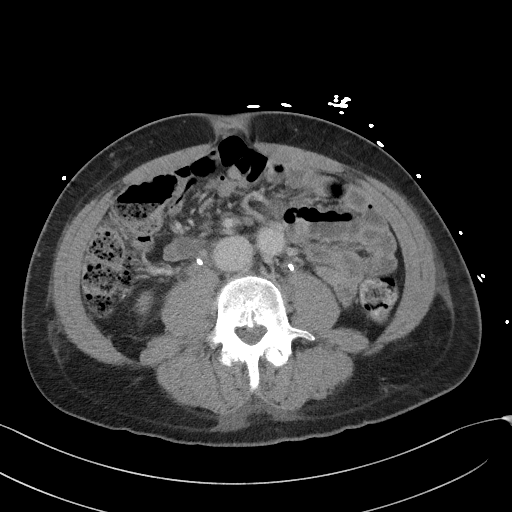
[im 54/94  soft-tissue]
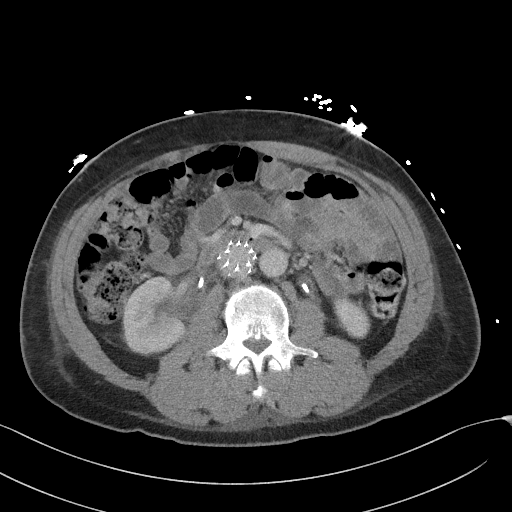
[im 60/94  soft-tissue]
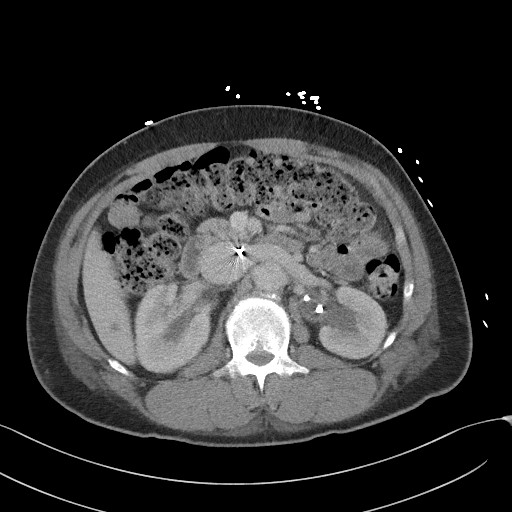
[im 60/94  bone]
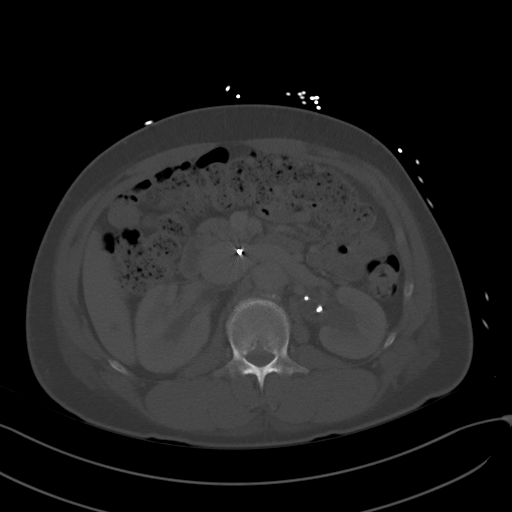
[im 67/94  soft-tissue]
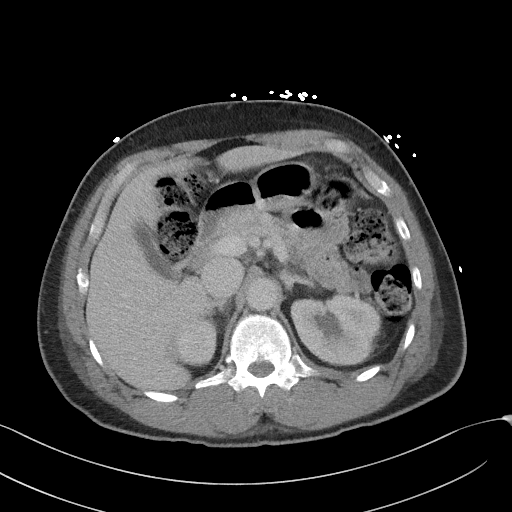
[im 74/94  soft-tissue]
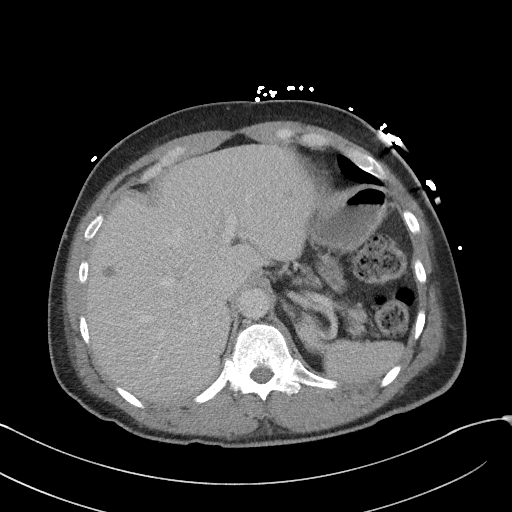
[im 80/94  soft-tissue]
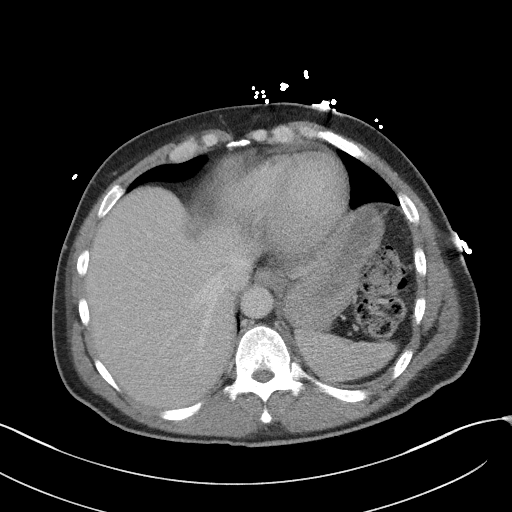
[im 87/94  soft-tissue]
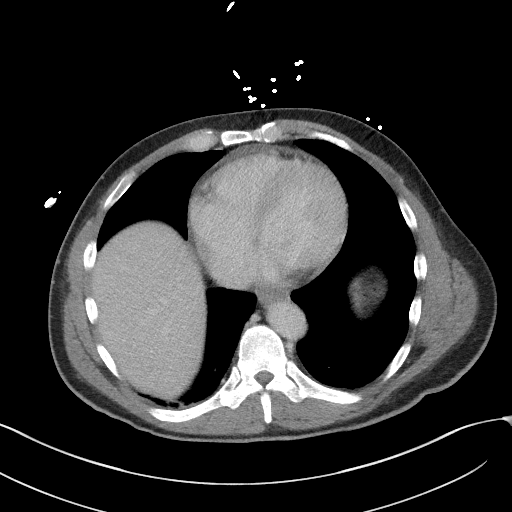

[Series 4: coronal st · coronal · 0.86mm/px · 3 of 80 slices shown]
[im 27/80  soft-tissue]
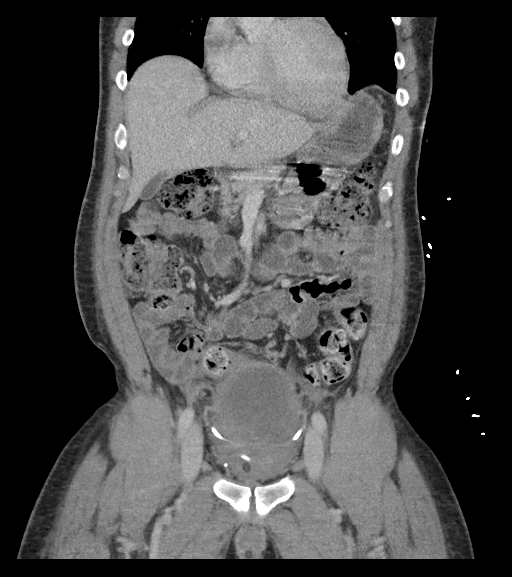
[im 36/80  soft-tissue]
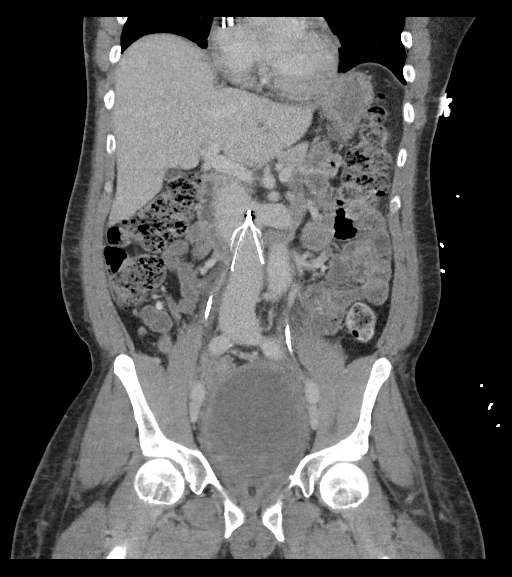
[im 44/80  soft-tissue]
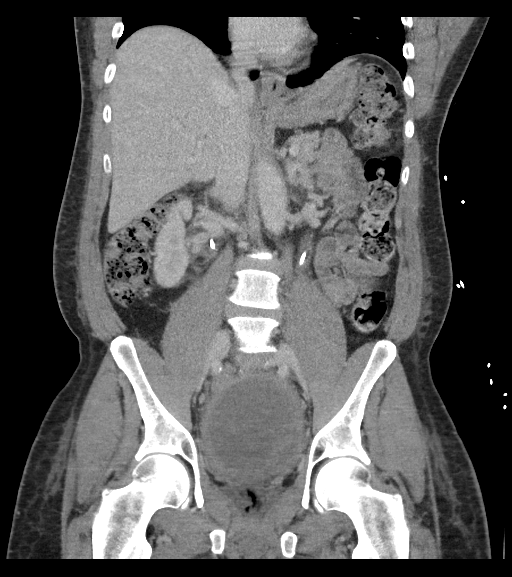

[16 of 46 positions shown; findings below may reference images not displayed]

FINDINGS: Lower chest: Unchanged 6 mm pulmonary nodule in the left lower lobe.
New trace pericardial effusion.

Hepatobiliary: Unchanged 1.5 cm right hepatic simple cyst.
Additional subcentimeter low-density lesion in the inferior right
hepatic lobe remains too small to characterize. No new focal liver
abnormality. The gallbladder is decompressed. No biliary dilatation.

Pancreas: Unremarkable. No pancreatic ductal dilatation or
surrounding inflammatory changes.

Spleen: Normal in size without focal abnormality.

Adrenals/Urinary Tract: The adrenal glands are unremarkable. No
focal renal mass. No renal or ureteral calculi. Unchanged bilateral
ureteral stents. No hydronephrosis. Unchanged anterior displacement
of the bladder, which remains decompressed by Foley catheter.

Stomach/Bowel: Stomach is within normal limits. Appendix appears
normal. No evidence of bowel wall thickening, distention, or
inflammatory changes.

Vascular/Lymphatic: Interval placement of an infrarenal IVC filter.
No significant vascular findings are present. No enlarged abdominal
lymph nodes. Unchanged borderline enlarged right external iliac
lymph node measuring 10 mm in short axis.

Reproductive: Grossly unchanged necrotic central pelvic mass
measuring 11.4 x 9.6 cm, previously 11.6 x 9.5 cm. This remains
intimately associated with the posterior bladder wall.

Other: No free fluid or pneumoperitoneum. Unchanged mild presacral
stranding.

Musculoskeletal: No acute or significant osseous findings.
IMPRESSION: 1.  No acute intra-abdominal process.  Normal appendix.
2. Grossly unchanged necrotic central pelvic mass, consistent with
history of prostate leiomyosarcoma. The mass remains inseparable
from the posterior wall of the bladder.
3. Unchanged left lower lobe pulmonary nodule and borderline right
external iliac adenopathy.
4. New trace pericardial effusion.

## 2020-08-19 IMAGING — CR DG CHEST 2V
2 series · 2 of 2 positions shown · non-contrast
Comparison: 08/13/2018, 08/04/2018, CT 05/22/2018

CLINICAL DATA: 52-year-old male with a history of leiomyosarcoma

EXAM:
CHEST - 2 VIEW

[w chest pa]
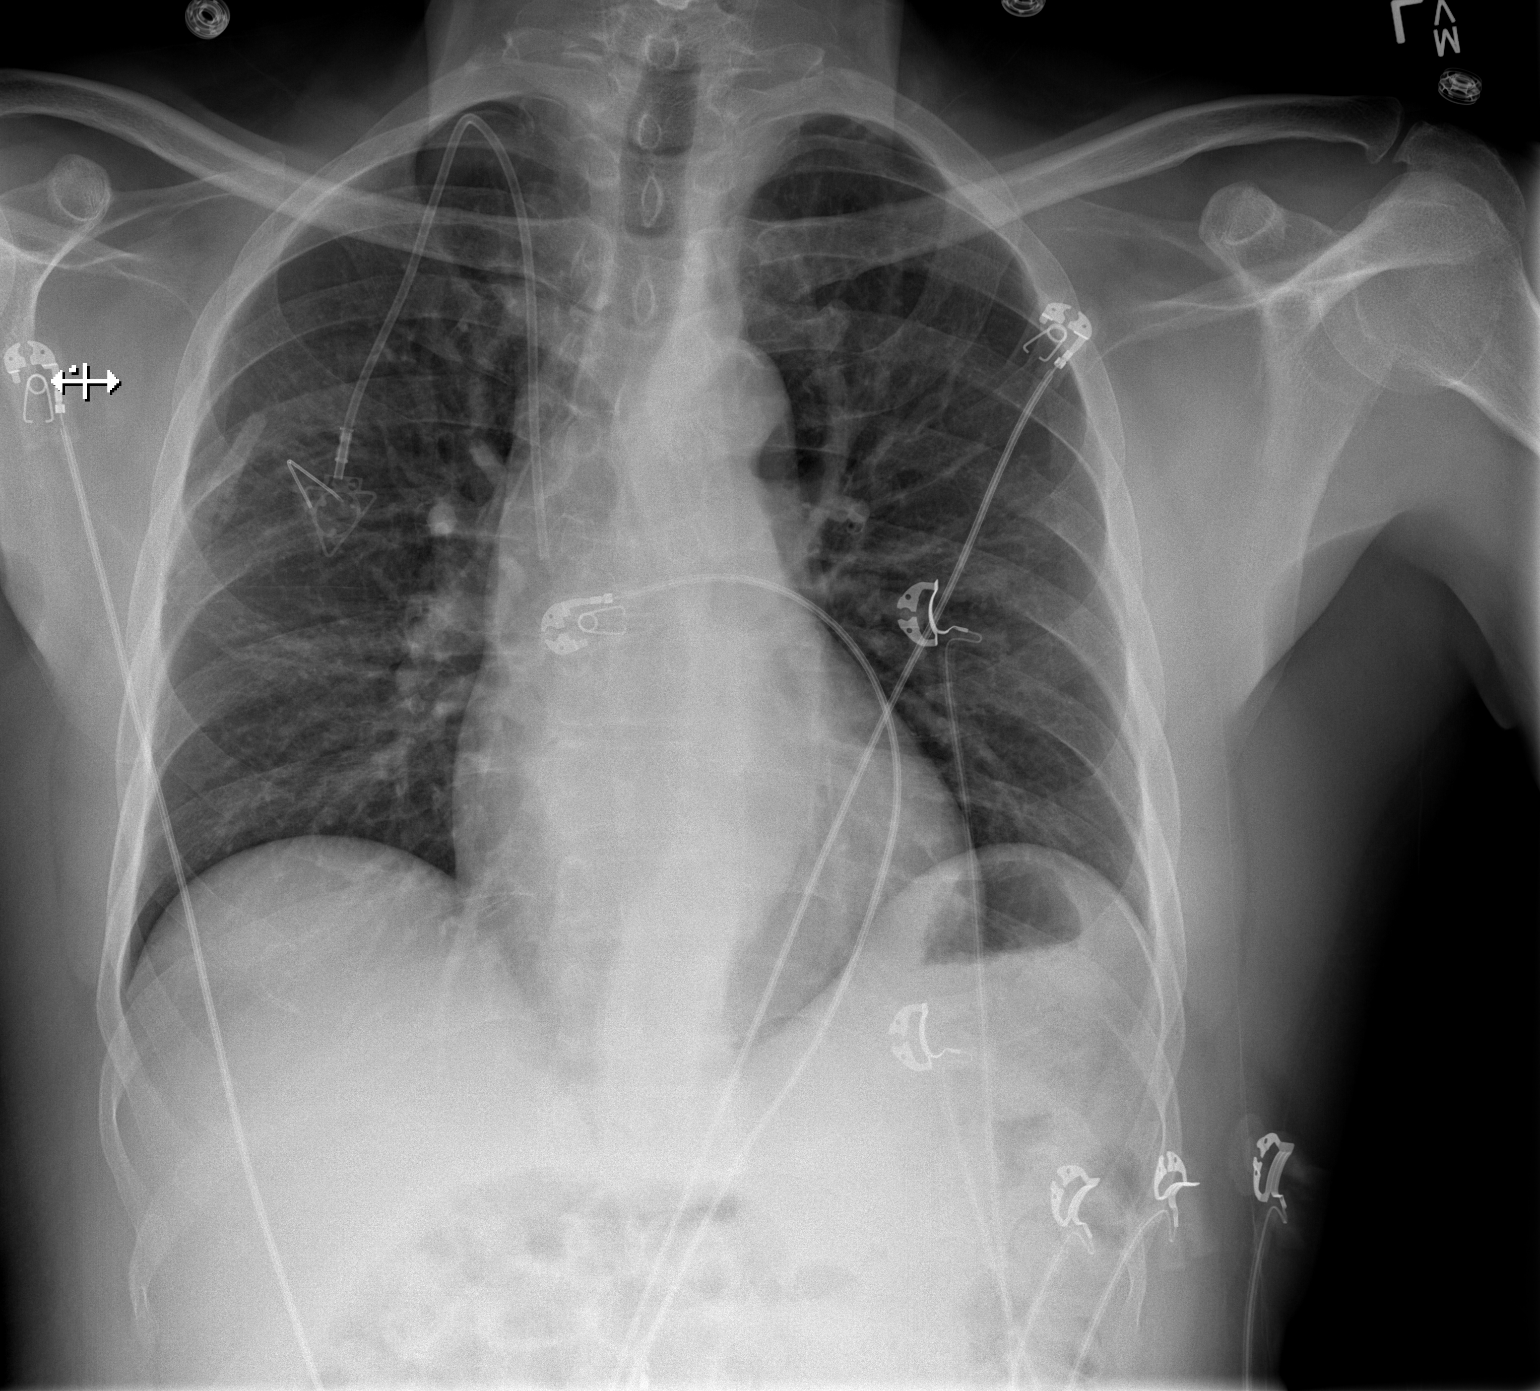

[w chest lat]
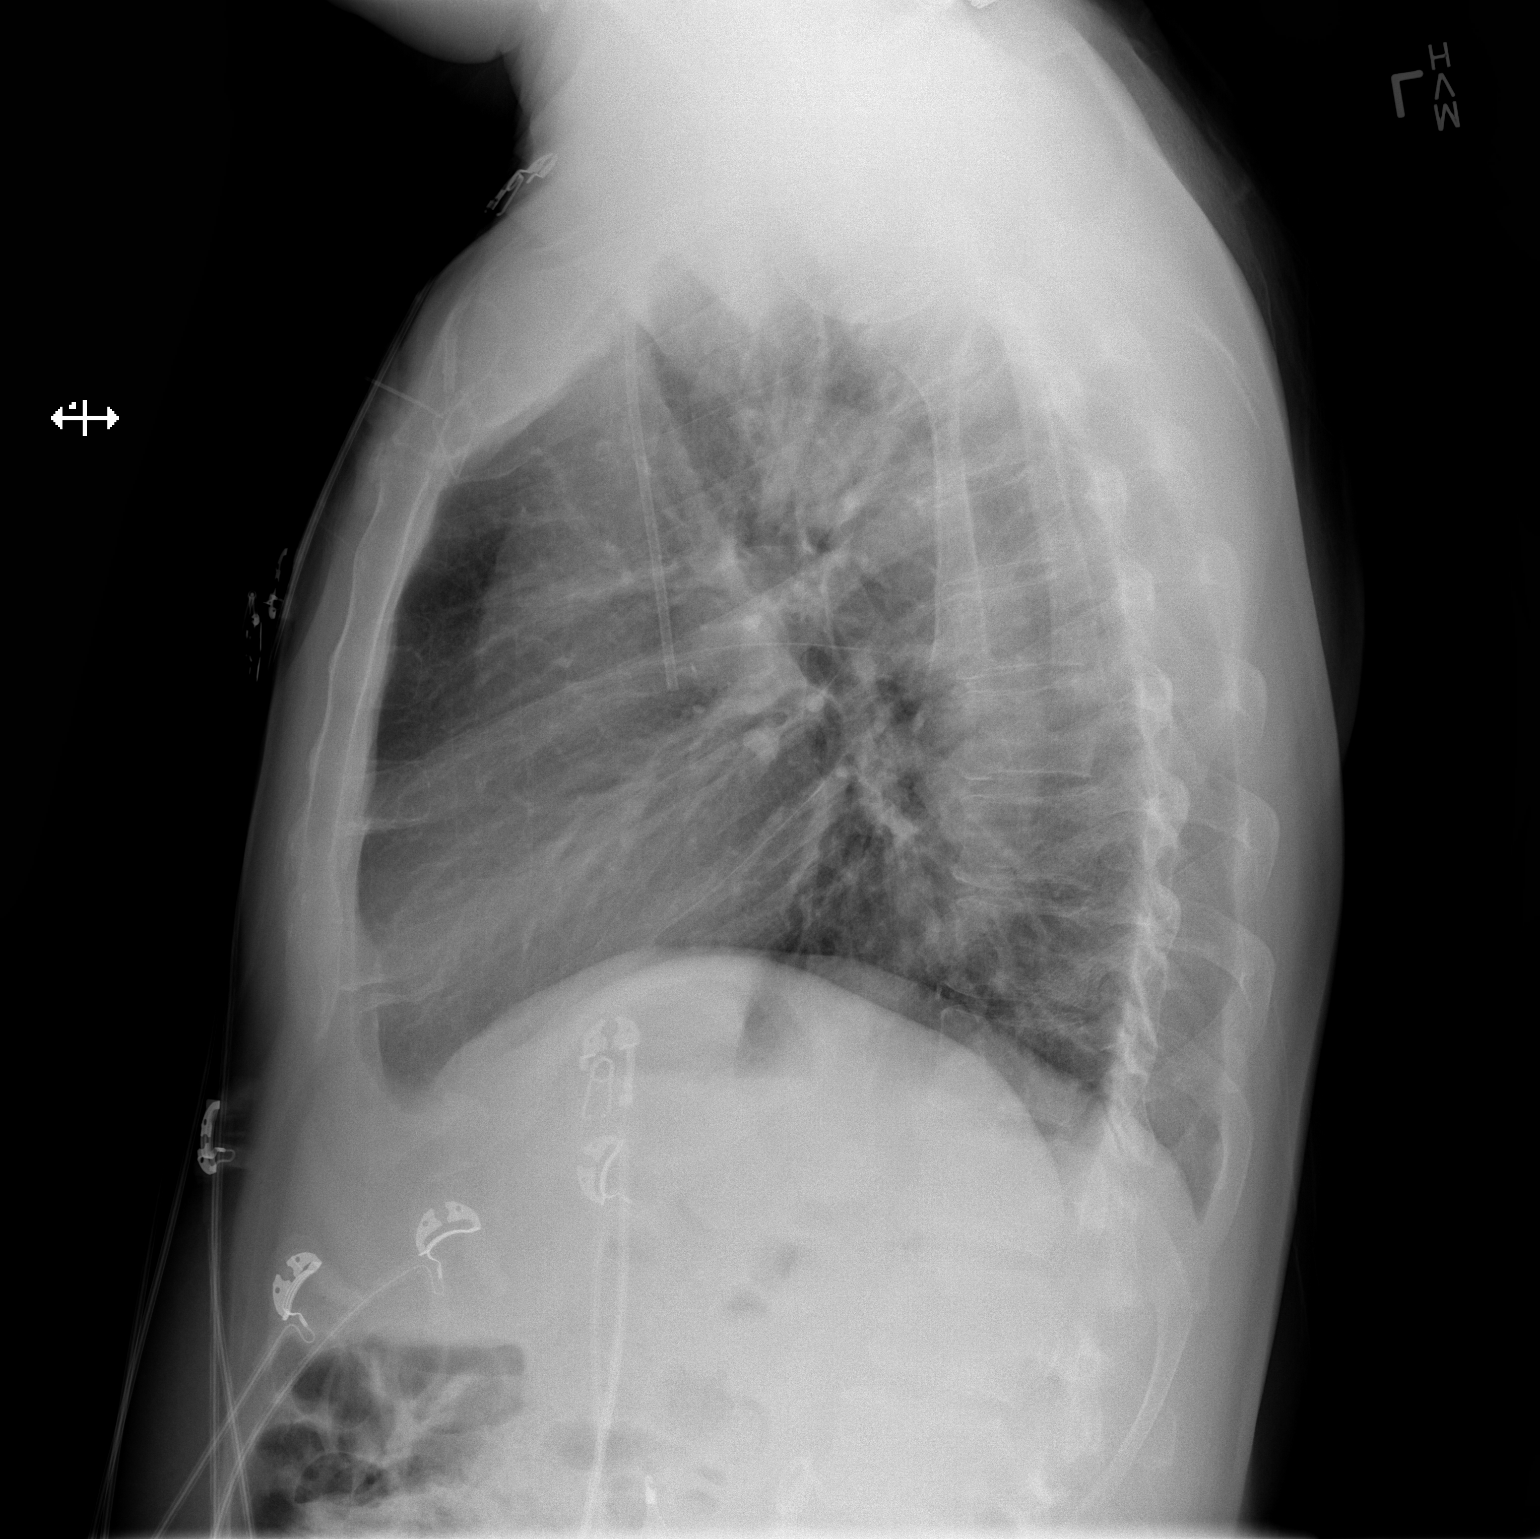

[2 of 2 positions shown; findings below may reference images not displayed]

FINDINGS: Cardiomediastinal silhouette unchanged in size and contour. No
evidence of central vascular congestion. No pneumothorax or pleural
effusion. No confluent airspace disease. Unchanged right IJ port
catheter with the tip appearing to terminate superior vena cava.
Small nodules present on the CT 05/22/2018 are not visualized.
IMPRESSION: Negative for acute cardiopulmonary disease.

Unchanged right IJ port catheter
